# Patient Record
Sex: Male | Born: 1950 | Race: White | Hispanic: No | Marital: Married | Smoking: Former smoker
Health system: Southern US, Community
[De-identification: ages and names within clinical notes are randomized; demographics above are authoritative.]

## PROBLEM LIST (undated history)

## (undated) DIAGNOSIS — N183 Chronic kidney disease, stage 3 unspecified: Secondary | ICD-10-CM

## (undated) DIAGNOSIS — D631 Anemia in chronic kidney disease: Secondary | ICD-10-CM

## (undated) DIAGNOSIS — N189 Chronic kidney disease, unspecified: Secondary | ICD-10-CM

## (undated) DIAGNOSIS — Z8711 Personal history of peptic ulcer disease: Secondary | ICD-10-CM

## (undated) DIAGNOSIS — M47812 Spondylosis without myelopathy or radiculopathy, cervical region: Secondary | ICD-10-CM

## (undated) DIAGNOSIS — K219 Gastro-esophageal reflux disease without esophagitis: Secondary | ICD-10-CM

## (undated) DIAGNOSIS — M109 Gout, unspecified: Secondary | ICD-10-CM

## (undated) DIAGNOSIS — K603 Anal fistula, unspecified: Secondary | ICD-10-CM

## (undated) DIAGNOSIS — K5730 Diverticulosis of large intestine without perforation or abscess without bleeding: Secondary | ICD-10-CM

## (undated) DIAGNOSIS — Z973 Presence of spectacles and contact lenses: Secondary | ICD-10-CM

## (undated) DIAGNOSIS — D500 Iron deficiency anemia secondary to blood loss (chronic): Secondary | ICD-10-CM

## (undated) DIAGNOSIS — C649 Malignant neoplasm of unspecified kidney, except renal pelvis: Secondary | ICD-10-CM

## (undated) DIAGNOSIS — K2270 Barrett's esophagus without dysplasia: Secondary | ICD-10-CM

## (undated) DIAGNOSIS — D696 Thrombocytopenia, unspecified: Secondary | ICD-10-CM

## (undated) DIAGNOSIS — Z8719 Personal history of other diseases of the digestive system: Secondary | ICD-10-CM

## (undated) DIAGNOSIS — R351 Nocturia: Secondary | ICD-10-CM

## (undated) DIAGNOSIS — G8929 Other chronic pain: Secondary | ICD-10-CM

## (undated) HISTORY — PX: LAPAROSCOPIC NEPHRECTOMY: SUR781

## (undated) HISTORY — PX: INGUINAL HERNIA REPAIR: SUR1180

---
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
## 2003-07-19 ENCOUNTER — Emergency Department (HOSPITAL_COMMUNITY): Admission: EM | Admit: 2003-07-19 | Discharge: 2003-07-19 | Payer: Self-pay | Admitting: Emergency Medicine

## 2003-07-26 ENCOUNTER — Emergency Department (HOSPITAL_COMMUNITY): Admission: EM | Admit: 2003-07-26 | Discharge: 2003-07-26 | Payer: Self-pay | Admitting: Emergency Medicine

## 2003-07-28 ENCOUNTER — Ambulatory Visit (HOSPITAL_COMMUNITY): Admission: RE | Admit: 2003-07-28 | Discharge: 2003-07-28 | Payer: Self-pay | Admitting: Orthopaedic Surgery

## 2003-10-16 ENCOUNTER — Emergency Department (HOSPITAL_COMMUNITY): Admission: EM | Admit: 2003-10-16 | Discharge: 2003-10-16 | Payer: Self-pay | Admitting: Emergency Medicine

## 2004-05-25 ENCOUNTER — Emergency Department (HOSPITAL_COMMUNITY): Admission: EM | Admit: 2004-05-25 | Discharge: 2004-05-25 | Payer: Self-pay | Admitting: Emergency Medicine

## 2005-08-27 ENCOUNTER — Emergency Department (HOSPITAL_COMMUNITY): Admission: EM | Admit: 2005-08-27 | Discharge: 2005-08-27 | Payer: Self-pay | Admitting: Emergency Medicine

## 2005-08-29 ENCOUNTER — Emergency Department (HOSPITAL_COMMUNITY): Admission: EM | Admit: 2005-08-29 | Discharge: 2005-08-29 | Payer: Self-pay | Admitting: Emergency Medicine

## 2006-07-01 ENCOUNTER — Emergency Department (HOSPITAL_COMMUNITY): Admission: EM | Admit: 2006-07-01 | Discharge: 2006-07-01 | Payer: Self-pay | Admitting: Emergency Medicine

## 2007-04-15 ENCOUNTER — Emergency Department (HOSPITAL_COMMUNITY): Admission: EM | Admit: 2007-04-15 | Discharge: 2007-04-15 | Payer: Self-pay | Admitting: Emergency Medicine

## 2007-07-30 ENCOUNTER — Emergency Department (HOSPITAL_COMMUNITY): Admission: EM | Admit: 2007-07-30 | Discharge: 2007-07-31 | Payer: Self-pay | Admitting: Emergency Medicine

## 2007-11-11 ENCOUNTER — Ambulatory Visit: Payer: Self-pay | Admitting: Gastroenterology

## 2009-06-14 ENCOUNTER — Ambulatory Visit (HOSPITAL_COMMUNITY): Admission: RE | Admit: 2009-06-14 | Discharge: 2009-06-14 | Payer: Self-pay | Admitting: Urology

## 2009-06-27 ENCOUNTER — Inpatient Hospital Stay (HOSPITAL_COMMUNITY): Admission: RE | Admit: 2009-06-27 | Discharge: 2009-07-01 | Payer: Self-pay | Admitting: Urology

## 2009-06-27 ENCOUNTER — Encounter (INDEPENDENT_AMBULATORY_CARE_PROVIDER_SITE_OTHER): Payer: Self-pay | Admitting: Urology

## 2009-07-28 ENCOUNTER — Ambulatory Visit (HOSPITAL_COMMUNITY): Admission: RE | Admit: 2009-07-28 | Discharge: 2009-07-28 | Payer: Self-pay | Admitting: Urology

## 2009-08-09 ENCOUNTER — Ambulatory Visit (HOSPITAL_COMMUNITY): Admission: RE | Admit: 2009-08-09 | Discharge: 2009-08-09 | Payer: Self-pay | Admitting: Urology

## 2009-10-03 ENCOUNTER — Emergency Department (HOSPITAL_COMMUNITY): Admission: EM | Admit: 2009-10-03 | Discharge: 2009-10-03 | Payer: Self-pay | Admitting: Emergency Medicine

## 2009-10-18 HISTORY — PX: COLONOSCOPY: SHX174

## 2009-10-18 HISTORY — PX: ESOPHAGOGASTRODUODENOSCOPY: SHX1529

## 2009-10-20 ENCOUNTER — Encounter (INDEPENDENT_AMBULATORY_CARE_PROVIDER_SITE_OTHER): Payer: Self-pay | Admitting: *Deleted

## 2009-10-20 ENCOUNTER — Ambulatory Visit: Payer: Self-pay | Admitting: Internal Medicine

## 2009-10-20 DIAGNOSIS — R112 Nausea with vomiting, unspecified: Secondary | ICD-10-CM

## 2009-10-20 DIAGNOSIS — K625 Hemorrhage of anus and rectum: Secondary | ICD-10-CM

## 2009-10-20 DIAGNOSIS — R197 Diarrhea, unspecified: Secondary | ICD-10-CM

## 2009-11-02 ENCOUNTER — Ambulatory Visit (HOSPITAL_COMMUNITY): Admission: RE | Admit: 2009-11-02 | Discharge: 2009-11-02 | Payer: Self-pay | Admitting: Internal Medicine

## 2009-11-02 ENCOUNTER — Ambulatory Visit: Payer: Self-pay | Admitting: Internal Medicine

## 2009-11-05 ENCOUNTER — Encounter: Payer: Self-pay | Admitting: Internal Medicine

## 2010-06-10 ENCOUNTER — Emergency Department (HOSPITAL_COMMUNITY): Admission: EM | Admit: 2010-06-10 | Discharge: 2010-06-10 | Payer: Self-pay | Admitting: Emergency Medicine

## 2010-10-17 NOTE — Letter (Signed)
Summary: Patient Notice, Endo Biopsy Results  [facility]  [address]   [place], [place] [address]   Phone: [phone]  Fax: [phone]       November 05, 2009   Jeffrey Trujillo [address] [place], [place]  [address] Oct 10, 1950    Dear Jeffrey Trujillo,  I am pleased to inform you that the biopsies taken during your recent endoscopic examination did not show any evidence of cancer upon pathologic examination.  However, you do have a conditon called Barrett's esophagus which warrants  periodic examination of your esophagus.  Additional information/recommendations:  You should have a repeat endoscopic examination in 1 years.  Please call us if you are having persistent problems or have questions about your condition that have not been fully answered at this time.  Sincerely,    R. Roetta Sessions MD  [facility] Ph: [phone]   Fax: [phone]   Appended Document: Patient Notice, Endo Biopsy Results Letter and info on Barrett's mailed.

## 2010-10-17 NOTE — Letter (Signed)
Summary: Out of Work Note  [facility]  [address]   [place], [place] [address]   Phone: [phone]  Fax: [phone]    10/20/2009  TO: WHOM IT MAY CONCERN  RE: Jeffrey Trujillo [address] [id number] 24-May-1951       The above named individual is currently under my care and will be out of work    FROM: 10/20/2009   THROUGH: 10/21/2009    REASON: Unable to work.    MAY RETURN ON: 10/21/2009     If you have any further questions or need additional information, please call.     Sincerely,     [facility] R. Roetta Sessions, M.D.    Kassie Mends, M.D. Lorenza Burton, FNP-BC    Tana Coast, PA-C Phone: [phone]    Fax: [phone]

## 2010-10-17 NOTE — Letter (Signed)
Summary: TCS/EGD ORDER  TCS/EGD ORDER   Imported By: Ricard Dillon 10/20/2009 12:58:01  _____________________________________________________________________  External Attachment:    Type:   Image     Comment:   External Document

## 2010-10-17 NOTE — Assessment & Plan Note (Signed)
Summary: NPP/RECTAL BLEEDING./GU   Visit Type:  Initial Consult Referring Provider:  ED Referral Primary Care Provider:  [facility]  Chief Complaint:  rectal bleeding.  History of Present Illness: 60 y/o caucasian male w/ acute N/V/ & bloody diarrhea.  Took BC arthritis 4 days ago, felt sick, w/ c/o nausea, vomiting, bloody diarrhea TNTC. c/o umbilical & suprapubic pain, felt like someone "hit him in stomach", now resolved.  Last bleeding small volume and pinkish-red this AM.  Denies any fever.  Denies any ill contacts.  Denies HB or indigestion, denies dyspahagia or odynophagia.  Denies wt loss or anorexia. Denies early satiety.  Takes 2 naoprosyn couple times per month for gout.  Went to ER Mon PM. Had colonoscopy 2 yrs ago at [facility] in [place] was told normal.  Hx polyps Dr Madelin Headings in 1980's.  T Bili 1.7, indirect 1.4, otherwise normal LFTS.  Met 7 normal.   WBC                                      9.0               4.0-10.5         K/uL  RBC                                      4.08       l      4.22-5.81        MIL/uL  Hemoglobin (HGB)                         13.1              13.0-17.0        g/dL  Hematocrit (HCT)                         38.1       l      39.0-52.0        %  MCV                                      93.3              78.0-100.0       fL  MCHC                                     34.3              30.0-36.0        g/dL  RDW                                      18.3       h      11.5-15.5        %  Platelet Count (PLT)                     123  l      150-400          K/uL  Neutrophils, %                           89         h      43-77            %  Lymphocytes, %                           4          l      12-46            %  Monocytes, %                             7                 3-12             %  Eosinophils, %                           0                 0-5              %  Basophils, %                             0                  0-1              %  Neutrophils, Absolute                    8.0        h      1.7-7.7          K/uL  Lymphocytes, Absolute                    0.3        l      0.7-4.0          K/uL  Monocytes, Absolute                      0.6               0.1-1.0          K/uL  Eosinophils, Absolute                    0.0               0.0-0.7          K/uL  Basophils, Absolute                      0.0               0.0-0.1          K/uL  Current Problems (verified): 1)  Diarrhea, Bloody  (ICD-787.91) 2)  Nausea With Vomiting  (ICD-787.01) 3)  Rectal Bleeding  (ICD-569.3)  Current Medications (verified): 1)  Naprosyn 500 Mg Tabs (Naproxen) .... As Needed 2)  Tylenol .... As Needed 3)  Omeprazole 20 Mg Cpdr (Omeprazole) .... One By Mouth 30 Mins Before Breakfast Daily  Allergies (verified): 1)  ! Demerol  Past History:  Past Medical History: Renal cell ca dx Sept 2010 Dr. Laverle Patter [facility] gout  Past Surgical History: Right nephrectomy Oct 2010 LLL benign cyst age 75 SH/Risk Factors reviewed for relevance  Family History: No known family history of colorectal carcinoma, IBD, liver or chronic GI problems. 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 CV:  Denies chest pains, angina, palpitations, syncope, dyspnea on exertion, orthopnea, PND, peripheral edema, and claudication. Resp:  Denies dyspnea at rest, dyspnea with exercise, cough, sputum, wheezing, coughing up blood, and pleurisy. GI:  Denies vomiting blood,  jaundice, gas/bloating, constipation, black BMs, and fecal incontinence. GU:  Complains of urinary frequency and nocturnal urination; denies urinary burning, blood in urine, urinary hesitancy, and urinary incontinence. Derm:  Denies rash, itching, dry skin, hives, moles, warts, and unhealing ulcers. Psych:  Denies depression, anxiety, memory loss, suicidal ideation, hallucinations, paranoia, phobia, and confusion. Heme:  Denies bruising and enlarged lymph nodes.  Vital Signs:  Patient profile:   60 year old male Height:      71 inches Weight:      183 pounds BMI:     25.62 Temp:     97.7 degrees F oral Pulse rate:   72 / minute BP sitting:   134 / 80  (left arm) Cuff size:   regular  Vitals Entered By: Cloria Spring LPN (October 20, 2009 9:21 AM)  Physical Exam  General:  Well developed, well nourished, no acute distress. Head:  Normocephalic and atraumatic. Eyes:  Sclera clear, no icterus. Ears:  Normal auditory acuity. Nose:  No deformity, discharge,  or lesions. Mouth:  No deformity or lesions, dentition normal. Neck:  Supple; no masses or thyromegaly. Lungs:  Clear throughout to auscultation. Heart:  Regular rate and rhythm; no murmurs, rubs,  or bruits. Abdomen:  Soft, nontender and nondistended. No masses, hepatosplenomegaly or hernias noted. Normal bowel sounds.without guarding and without rebound.   Rectal:  deferred until time of colonoscopy.   Msk:  Symmetrical with no gross deformities. Normal posture. Pulses:  Normal pulses noted. Extremities:  No clubbing, cyanosis, edema or deformities noted. Neurologic:  Alert and  oriented x4;  grossly normal neurologically. Skin:  Intact without significant lesions or rashes. Cervical Nodes:  No significant cervical adenopathy. Psych:  Alert and cooperative. Normal mood and affect.  Impression & Recommendations:  Problem # 1:  DIARRHEA, BLOODY (ICD-40.91) 60 y/o caucasian male w/ acute onset bloody diarrhea, nausea &  vomiting x 4 days.  He had been taking BC Arthritis just prior to the onset.  He also consumes a significant amt of etoh daily.  He is going to need further evaluation to rule out PUD, gastritis, NSAID-induced colitis, inflammatory bowel disease, or acute gastroenteritis with benign anorectal bleeding.  Diagnostic colonoscopy & EGD to be performed by Dr. Suszanne Conners Rourk in the near future in the OR given daily etoh  use.  I have discussed risks and benefits which include, but are not limited to, bleeding, infection, perforation, or medication reaction.  The patient agrees with this plan and consent will be obtained.  Orders: Consultation Level III ([id number])  Problem # 2:  NAUSEA WITH VOMITING (ICD-787.01) See #1  Patient Instructions: 1)  Avoid Naprosyn & BC powders 2)  Call if any further rectal bleeding, pain, diarrhea, fever, etc or go to ER if after hrs Prescriptions: OMEPRAZOLE 20 MG CPDR (OMEPRAZOLE) one by mouth 30 mins before breakfast daily  #31 x 2   Entered and Authorized by:   Joselyn Arrow FNP-BC   Signed by:   Joselyn Arrow FNP-BC on 10/20/2009   Method used:   Electronically to        [facility] (retail)       [address]       [place], [place]  [address]       Ph: [phone]       Fax: [phone]   RxID:   [id number]

## 2010-11-03 ENCOUNTER — Encounter (INDEPENDENT_AMBULATORY_CARE_PROVIDER_SITE_OTHER): Payer: Self-pay

## 2010-11-08 NOTE — Letter (Signed)
Summary: Recall Colonoscopy/Endoscopy, Change to Office Visit  [facility]  [address]   [place], [place] [address]   Phone: [phone]  Fax: [phone]      November 03, 2010   Jeffrey Trujillo [address] [place], [place]  [address] 10-25-1950   Dear [name],   According to our records, it is time for you to schedule a Colonoscopy/Endoscopy. However, after reviewing your medical record, we recommend an office visit in order to determine your need for a repeat procedure.  Please call [phone] at your convenience to schedule an office visit. If you have any questions or concerns, please feel free to contact our office.   Sincerely,   Cloria Spring LPN  [facility] Ph: [phone]   Fax: [phone]

## 2010-12-03 LAB — CBC
HCT: 38.1 % — ABNORMAL LOW (ref 39.0–52.0)
Hemoglobin: 13.1 g/dL (ref 13.0–17.0)
MCHC: 34.3 g/dL (ref 30.0–36.0)
MCV: 93.3 fL (ref 78.0–100.0)
Platelets: 123 10*3/uL — ABNORMAL LOW (ref 150–400)
RBC: 4.08 MIL/uL — ABNORMAL LOW (ref 4.22–5.81)
RDW: 18.3 % — ABNORMAL HIGH (ref 11.5–15.5)
WBC: 9 10*3/uL (ref 4.0–10.5)

## 2010-12-03 LAB — DIFFERENTIAL
Basophils Absolute: 0 10*3/uL (ref 0.0–0.1)
Basophils Relative: 0 % (ref 0–1)
Eosinophils Absolute: 0 10*3/uL (ref 0.0–0.7)
Eosinophils Relative: 0 % (ref 0–5)
Lymphocytes Relative: 4 % — ABNORMAL LOW (ref 12–46)
Lymphs Abs: 0.3 10*3/uL — ABNORMAL LOW (ref 0.7–4.0)
Monocytes Absolute: 0.6 10*3/uL (ref 0.1–1.0)
Monocytes Relative: 7 % (ref 3–12)
Neutro Abs: 8 10*3/uL — ABNORMAL HIGH (ref 1.7–7.7)
Neutrophils Relative %: 89 % — ABNORMAL HIGH (ref 43–77)

## 2010-12-03 LAB — HEPATIC FUNCTION PANEL
ALT: 35 U/L (ref 0–53)
AST: 31 U/L (ref 0–37)
Albumin: 3.5 g/dL (ref 3.5–5.2)
Alkaline Phosphatase: 94 U/L (ref 39–117)
Bilirubin, Direct: 0.3 mg/dL (ref 0.0–0.3)
Indirect Bilirubin: 1.4 mg/dL — ABNORMAL HIGH (ref 0.3–0.9)
Total Bilirubin: 1.7 mg/dL — ABNORMAL HIGH (ref 0.3–1.2)
Total Protein: 7 g/dL (ref 6.0–8.3)

## 2010-12-03 LAB — BASIC METABOLIC PANEL
BUN: 14 mg/dL (ref 6–23)
CO2: 28 mEq/L (ref 19–32)
Calcium: 8.9 mg/dL (ref 8.4–10.5)
Chloride: 98 mEq/L (ref 96–112)
Creatinine, Ser: 1.22 mg/dL (ref 0.4–1.5)
GFR calc Af Amer: >60 mL/min (ref >60)
GFR calc non Af Amer: >60 mL/min (ref >60)
Glucose, Bld: 109 mg/dL — ABNORMAL HIGH (ref 70–99)
Potassium: 3.8 mEq/L (ref 3.5–5.1)
Sodium: 136 mEq/L (ref 135–145)

## 2010-12-03 LAB — SAMPLE TO BLOOD BANK

## 2010-12-20 LAB — CBC
HCT: 31 % — ABNORMAL LOW (ref 39.0–52.0)
Hemoglobin: 10.9 g/dL — ABNORMAL LOW (ref 13.0–17.0)
MCHC: 35.1 g/dL (ref 30.0–36.0)
MCV: 89.3 fL (ref 78.0–100.0)
Platelets: 241 10*3/uL (ref 150–400)
RBC: 3.47 MIL/uL — ABNORMAL LOW (ref 4.22–5.81)
RDW: 14.9 % (ref 11.5–15.5)
WBC: 5.4 10*3/uL (ref 4.0–10.5)

## 2010-12-20 LAB — PROTIME-INR
INR: 0.96 (ref 0.00–1.49)
Prothrombin Time: 12.7 seconds (ref 11.6–15.2)

## 2010-12-20 LAB — APTT: aPTT: 32 seconds (ref 24–37)

## 2010-12-21 LAB — DIFFERENTIAL
Basophils Absolute: 0 10*3/uL (ref 0.0–0.1)
Basophils Relative: 1 % (ref 0–1)
Eosinophils Absolute: 0.1 10*3/uL (ref 0.0–0.7)
Eosinophils Absolute: 0.1 10*3/uL (ref 0.0–0.7)
Eosinophils Relative: 2 % (ref 0–5)
Lymphs Abs: 0.2 10*3/uL — ABNORMAL LOW (ref 0.7–4.0)
Monocytes Absolute: 0.4 10*3/uL (ref 0.1–1.0)
Monocytes Relative: 11 % (ref 3–12)
Neutrophils Relative %: 71 % (ref 43–77)

## 2010-12-21 LAB — BASIC METABOLIC PANEL
BUN: 7 mg/dL (ref 6–23)
BUN: 7 mg/dL (ref 6–23)
CO2: 27 mEq/L (ref 19–32)
CO2: 28 mEq/L (ref 19–32)
CO2: 32 mEq/L (ref 19–32)
Calcium: 8 mg/dL — ABNORMAL LOW (ref 8.4–10.5)
Calcium: 8.2 mg/dL — ABNORMAL LOW (ref 8.4–10.5)
Calcium: 8.6 mg/dL (ref 8.4–10.5)
Calcium: 9.4 mg/dL (ref 8.4–10.5)
Chloride: 101 mEq/L (ref 96–112)
Chloride: 104 mEq/L (ref 96–112)
Creatinine, Ser: 0.89 mg/dL (ref 0.4–1.5)
Creatinine, Ser: 1.06 mg/dL (ref 0.4–1.5)
Creatinine, Ser: 1.53 mg/dL — ABNORMAL HIGH (ref 0.4–1.5)
Creatinine, Ser: 1.74 mg/dL — ABNORMAL HIGH (ref 0.4–1.5)
GFR calc Af Amer: 49 mL/min — ABNORMAL LOW (ref >60)
GFR calc Af Amer: >60 mL/min (ref >60)
GFR calc Af Amer: >60 mL/min (ref >60)
GFR calc non Af Amer: 47 mL/min — ABNORMAL LOW (ref >60)
GFR calc non Af Amer: >60 mL/min (ref >60)
Glucose, Bld: 104 mg/dL — ABNORMAL HIGH (ref 70–99)
Glucose, Bld: 99 mg/dL (ref 70–99)
Potassium: 4.5 mEq/L (ref 3.5–5.1)
Sodium: 136 mEq/L (ref 135–145)
Sodium: 139 mEq/L (ref 135–145)

## 2010-12-21 LAB — TYPE AND SCREEN
ABO/RH(D): B NEG
Antibody Screen: NEGATIVE

## 2010-12-21 LAB — CBC
HCT: 24.7 % — ABNORMAL LOW (ref 39.0–52.0)
HCT: 37.3 % — ABNORMAL LOW (ref 39.0–52.0)
Hemoglobin: 13 g/dL (ref 13.0–17.0)
MCHC: 34.4 g/dL (ref 30.0–36.0)
MCHC: 34.9 g/dL (ref 30.0–36.0)
MCHC: 35 g/dL (ref 30.0–36.0)
MCV: 93.5 fL (ref 78.0–100.0)
MCV: 96.4 fL (ref 78.0–100.0)
MCV: 99.3 fL (ref 78.0–100.0)
Platelets: 124 10*3/uL — ABNORMAL LOW (ref 150–400)
Platelets: 183 10*3/uL (ref 150–400)
Platelets: 186 10*3/uL (ref 150–400)
RBC: 3.75 MIL/uL — ABNORMAL LOW (ref 4.22–5.81)
RDW: 14.3 % (ref 11.5–15.5)
RDW: 14.4 % (ref 11.5–15.5)
RDW: 14.9 % (ref 11.5–15.5)
WBC: 2.8 10*3/uL — ABNORMAL LOW (ref 4.0–10.5)
WBC: 3.5 10*3/uL — ABNORMAL LOW (ref 4.0–10.5)
WBC: 3.7 10*3/uL — ABNORMAL LOW (ref 4.0–10.5)

## 2010-12-21 LAB — ABO/RH: ABO/RH(D): B NEG

## 2010-12-21 LAB — HEMOGLOBIN AND HEMATOCRIT, BLOOD
HCT: 27.8 % — ABNORMAL LOW (ref 39.0–52.0)
HCT: 35.1 % — ABNORMAL LOW (ref 39.0–52.0)
Hemoglobin: 12.1 g/dL — ABNORMAL LOW (ref 13.0–17.0)
Hemoglobin: 13.8 g/dL (ref 13.0–17.0)
Hemoglobin: 9.9 g/dL — ABNORMAL LOW (ref 13.0–17.0)

## 2010-12-21 LAB — PREPARE RBC (CROSSMATCH)

## 2010-12-26 ENCOUNTER — Other Ambulatory Visit (HOSPITAL_COMMUNITY): Payer: Self-pay | Admitting: Urology

## 2010-12-26 ENCOUNTER — Ambulatory Visit (HOSPITAL_COMMUNITY)
Admission: RE | Admit: 2010-12-26 | Discharge: 2010-12-26 | Disposition: A | Discharge status: Home / Self Care | Payer: PRIVATE HEALTH INSURANCE | Source: Ambulatory Visit | Attending: Urology | Admitting: Urology

## 2010-12-26 DIAGNOSIS — Z902 Acquired absence of lung [part of]: Secondary | ICD-10-CM | POA: Insufficient documentation

## 2010-12-26 DIAGNOSIS — C649 Malignant neoplasm of unspecified kidney, except renal pelvis: Secondary | ICD-10-CM | POA: Insufficient documentation

## 2011-06-05 ENCOUNTER — Emergency Department (HOSPITAL_COMMUNITY): Payer: PRIVATE HEALTH INSURANCE

## 2011-06-05 ENCOUNTER — Emergency Department (HOSPITAL_COMMUNITY)
Admission: EM | Admit: 2011-06-05 | Discharge: 2011-06-05 | Disposition: A | Discharge status: Home / Self Care | Payer: PRIVATE HEALTH INSURANCE | Attending: Emergency Medicine | Admitting: Emergency Medicine

## 2011-06-05 DIAGNOSIS — S161XXA Strain of muscle, fascia and tendon at neck level, initial encounter: Secondary | ICD-10-CM | POA: Diagnosis present

## 2011-06-05 DIAGNOSIS — R52 Pain, unspecified: Secondary | ICD-10-CM

## 2011-06-05 DIAGNOSIS — M542 Cervicalgia: Secondary | ICD-10-CM | POA: Insufficient documentation

## 2011-06-05 MED ORDER — IBUPROFEN 800 MG PO TABS: 800.0000 mg | ORAL_TABLET | Freq: Three times a day (TID) | ORAL | Status: AC

## 2011-06-05 MED ORDER — CYCLOBENZAPRINE HCL 5 MG PO TABS: 5.0000 mg | ORAL_TABLET | Freq: Three times a day (TID) | ORAL | Status: AC | PRN

## 2011-06-05 MED ORDER — OXYCODONE-ACETAMINOPHEN 5-325 MG PO TABS: 1.0000 | ORAL_TABLET | ORAL | Status: AC | PRN

## 2011-06-05 MED ORDER — FENTANYL CITRATE 0.05 MG/ML IJ SOLN
100.0000 ug | Freq: Once | INTRAMUSCULAR | Status: AC
  Administered 2011-06-05: 100 ug via INTRAVENOUS

## 2011-06-05 NOTE — ED Notes (Signed)
See paper chart for further documentation.

## 2011-06-05 NOTE — Progress Notes (Signed)
History of Present Illness   Patient Identification Jeffrey Trujillo is a 60 y.o. male.  Patient information was obtained from patient and spouse/SO. History/Exam limitations: none. Patient presented to the Emergency Department by private vehicle.  Chief Complaint  No chief complaint on file.   Patient presents with complaint of back pain. This is a result of no known injury. Onset of pain was a few hours ago and has been gradually worsening since. The pain is located in superior cervical spine and base of skull, described as stiffness and rated as severe, without radiation. Symptoms include no other symptoms. The patient also complains of poor posture, recent heavy lifting which resulted in MSK strain in his chest last week. The patient denies weakness, numbness, incontinence, abdominal pain, fever, tingling, chest pain. The patient denies other injuries. Care prior to arrival consisted of ice, heat and indomethacin with no relief.  No past medical history on file. No family history on file. Current Facility-Administered Medications  Medication Dose Route Frequency Provider Last Rate Last Dose  . fentaNYL (SUBLIMAZE) 0.05 MG/ML injection 100 mcg  100 mcg Intravenous Once Jacquelyn McGill   100 mcg at 06/05/11 1205   Current Outpatient Prescriptions  Medication Sig Dispense Refill  . cyclobenzaprine (FLEXERIL) 5 MG tablet Take 1-2 tablets (5-10 mg total) by mouth 3 (three) times daily as needed for muscle spasms.  30 tablet  0  . ibuprofen (ADVIL,MOTRIN) 800 MG tablet Take 1 tablet (800 mg total) by mouth 3 (three) times daily. For 5 days, then PRN pain  30 tablet  0  . oxyCODONE-acetaminophen (PERCOCET) 5-325 MG per tablet Take 1-2 tablets by mouth every 4 (four) hours as needed for pain.  30 tablet  0   Allergies  Allergen Reactions  . Nsaids Other (See Comments)    Patient only has one kidney.   . Meperidine Hcl Hives and Rash   History   Social History  . Marital Status:  Married    Spouse Name: N/A    Number of Children: N/A  . Years of Education: N/A   Occupational History  . Not on file.   Social History Main Topics  . Smoking status: Not on file  . Smokeless tobacco: Not on file  . Alcohol Use: Not on file  . Drug Use: Not on file  . Sexually Active: Not on file   Other Topics Concern  . Not on file   Social History Narrative  . No narrative on file   Review of Systems Pertinent items are noted in HPI.   Physical Exam   BP 146/71  Pulse 52  Temp(Src) 98.8 F (37.1 C) (Oral)  Resp 20  SpO2 98% BP 146/71  Pulse 52  Temp(Src) 98.8 F (37.1 C) (Oral)  Resp 20  SpO2 98% General appearance: alert, cooperative and moderate distress Head: Normocephalic, without obvious abnormality, atraumatic Eyes: conjunctivae/corneas clear. PERRL, EOM's intact. Fundi benign. Ears: normal TM's and external ear canals both ears Nose: Nares normal. Septum midline. Mucosa normal. No drainage or sinus tenderness. Throat: lips, mucosa, and tongue normal; teeth and gums normal Neck: decreased ROM, TTP over bilateral trapezius muscles Back: negative Lungs: clear to auscultation bilaterally Heart: regular rate and rhythm, S1, S2 normal, no murmur, click, rub or gallop Abdomen: soft, non-tender; bowel sounds normal; no masses,  no organomegaly Extremities: extremities normal, atraumatic, no cyanosis or edema and no edema, redness or tenderness in the calves or thighs Neurologic: Alert and oriented X 3, normal strength  and tone. Normal symmetric reflexes. Normal coordination and gait Cranial nerves: normal  ED Course   Studies: Imaging: X-ray: C-spine: Normal Results reviewed with Radiologist. MRI showing bulging disc but no other acute abnormalities (see report for full details)  Records Reviewed: Old medical records. Nursing notes.  Treatments: morphine intravenous, fentanyl intravenous and Flexeril oral for pain with moderate  relief.   Consultations: none  Disposition: Home Nonsteroidals, Narcotic pain medication, Muscle relaxants, Soft collar and Advised to return for signs of head injury, weakness, numbness or tingling to extremities, incontinence Diagnostic tests were reviewed and questions answered. Diagnosis, care plan and treatment options were discussed. The patient and spouse/SO understand instructions and will follow up as directed. Condition stable The patient and spouse was given verbal neck pain instructions

## 2011-06-05 NOTE — ED Provider Notes (Deleted)
History     CSN: [id number] Arrival date & time: 06/05/2011 10:01 AM   No chief complaint on file.    (Include location/radiation/quality/duration/timing/severity/associated sxs/prior treatment) HPI   No past medical history on file.   No past surgical history on file.  No family history on file.  History  Substance Use Topics  . Smoking status: Not on file  . Smokeless tobacco: Not on file  . Alcohol Use: Not on file      Review of Systems  Allergies  Nsaids and Meperidine hcl  Home Medications   Current Outpatient Rx  Name Route Sig Dispense Refill  . CYCLOBENZAPRINE HCL 5 MG PO TABS Oral Take 1-2 tablets (5-10 mg total) by mouth 3 (three) times daily as needed for muscle spasms. 30 tablet 0  . IBUPROFEN 800 MG PO TABS Oral Take 1 tablet (800 mg total) by mouth 3 (three) times daily. For 5 days, then PRN pain 30 tablet 0  . OXYCODONE-ACETAMINOPHEN 5-325 MG PO TABS Oral Take 1-2 tablets by mouth every 4 (four) hours as needed for pain. 30 tablet 0    Physical Exam    BP 146/71  Pulse 52  Temp(Src) 98.8 F (37.1 C) (Oral)  Resp 20  SpO2 98%  Physical Exam  ED Course  Procedures  Results for orders placed during the hospital encounter of 10/03/09  BASIC METABOLIC PANEL      Component Value Range   Sodium 136  135 - 145 (mEq/L)   Potassium 3.8  3.5 - 5.1 (mEq/L)   Chloride 98  96 - 112 (mEq/L)   CO2 28  19 - 32 (mEq/L)   Glucose, Bld 109 (*) 70 - 99 (mg/dL)   BUN 14  6 - 23 (mg/dL)   Creatinine, Ser 4.09  0.4 - 1.5 (mg/dL)   Calcium 8.9  8.4 - 81.1 (mg/dL)   GFR calc non Af Amer >60  >60 (mL/min)   GFR calc Af Amer    >60 (mL/min)   Value: >60            The eGFR has been calculated     using the MDRD equation.     This calculation has not been     validated in all clinical     situations.     eGFR's persistently     <60 mL/min signify     possible Chronic Kidney Disease.  CBC      Component Value Range   WBC 9.0  4.0 - 10.5 (K/uL)   RBC  4.08 (*) 4.22 - 5.81 (MIL/uL)   Hemoglobin 13.1  13.0 - 17.0 (g/dL)   HCT 91.4 (*) 78.2 - 52.0 (%)   MCV 93.3  78.0 - 100.0 (fL)   MCHC 34.3  30.0 - 36.0 (g/dL)   RDW 95.6 (*) 21.3 - 15.5 (%)   Platelets 123 (*) 150 - 400 (K/uL)  DIFFERENTIAL      Component Value Range   Neutrophils Relative 89 (*) 43 - 77 (%)   Neutro Abs 8.0 (*) 1.7 - 7.7 (K/uL)   Lymphocytes Relative 4 (*) 12 - 46 (%)   Lymphs Abs 0.3 (*) 0.7 - 4.0 (K/uL)   Monocytes Relative 7  3 - 12 (%)   Monocytes Absolute 0.6  0.1 - 1.0 (K/uL)   Eosinophils Relative 0  0 - 5 (%)   Eosinophils Absolute 0.0  0.0 - 0.7 (K/uL)   Basophils Relative 0  0 - 1 (%)   Basophils Absolute  0.0  0.0 - 0.1 (K/uL)  HEPATIC FUNCTION PANEL      Component Value Range   Total Protein 7.0  6.0 - 8.3 (g/dL)   Albumin 3.5  3.5 - 5.2 (g/dL)   AST 31  0 - 37 (U/L)   ALT 35  0 - 53 (U/L)   Alkaline Phosphatase 94  39 - 117 (U/L)   Total Bilirubin 1.7 (*) 0.3 - 1.2 (mg/dL)   Bilirubin, Direct 0.3  0.0 - 0.3 (mg/dL)   Indirect Bilirubin 1.4 (*) 0.3 - 0.9 (mg/dL)  SAMPLE TO BLOOD BANK      Component Value Range   Blood Bank Specimen SAMPLE AVAILABLE FOR TESTING     Sample Expiration 10/06/2009     Dg Cervical Spine Complete  06/05/2011  *RADIOLOGY REPORT*  Clinical Data: Neck pain  CERVICAL SPINE - COMPLETE 4+ VIEW  Comparison: None.  Findings: The cervical spine is visualized to the bottom of C7 on the lateral view.  Reversal of the normal cervical lordosis.  No evidence of fracture or dislocation. The vertebral body heights are maintained.  The dens appears intact.  The lateral masses of C1 are symmetric.  No prevertebral soft tissue swelling.  Multilevel degenerative changes.  Visualized lung apices are clear.  IMPRESSION: No fracture or dislocation is seen.  Multilevel degenerative changes.  Original Report Authenticated By: Charline Bills, M.D.   Mr Cervical Spine Wo Contrast  06/05/2011  *RADIOLOGY REPORT*  Clinical Data: Neck pain radiating  into both arms.  History of renal cell carcinoma.  MRI CERVICAL SPINE WITHOUT CONTRAST  Technique:  Multiplanar and multiecho pulse sequences of the cervical spine, to include the craniocervical junction and cervicothoracic junction, were obtained according to standard protocol without intravenous contrast.  Comparison: None.  Findings: Straightening of the normal cervical lordosis is noted. Vertebral body height and alignment are maintained.  No worrisome marrow lesion.  The craniocervical junction is normal and cervical cord signal is normal.  Imaged paraspinous structures are unremarkable.  C2-3:  Mild disc bulge without central canal or foraminal narrowing.  C3-4:  Shallow central protrusion effaces the ventral thecal sac. No cord deformity.  Uncovertebral disease causes mild to moderate foraminal narrowing, greater on the right.  C4-5:  There is some posterior bony ridging and uncovertebral disease.  Ventral thecal sac is narrowed but not effaced.  Foramina are open.  C5-6:  Small disc osteophyte complex with uncovertebral disease. Central canal and left foramen are mildly narrowed.  Right foramen open.  C6-7:  Disc osteophyte complex eccentric to the left with uncovertebral disease.  Ventral thecal sac is effaced.  Moderate left foraminal narrowing is identified.  Right foramen open.  C7-T1:  Negative.  IMPRESSION:  1.  Shallow central protrusion at C3-4 effaces the ventral thecal sac.  No cord deformity. 2.  Mild central canal and left foraminal narrowing C5-6. 3.  Moderate left foraminal narrowing C6-7.  Disc at this level effaces the ventral thecal sac.  Original Report Authenticated By: Bernadene Bell. Maricela Curet, M.D.     1. Pain      MDM        Nelia Shi, MD 06/05/11 2259

## 2011-06-05 NOTE — Progress Notes (Signed)
History of Present Illness   Patient Identification Jeffrey Trujillo is a 60 y.o. male.  Patient information was obtained from patient and spouse/SO. History/Exam limitations: none. Patient presented to the Emergency Department by private vehicle.  Chief Complaint  No chief complaint on file.   Patient presents with complaint of back pain. This is a result of no known injury. Onset of pain was a few hours ago and has been gradually worsening since. The pain is located in superior cervical spine and base of skull, described as stiffness and rated as severe, without radiation. Symptoms include no other symptoms. The patient also complains of poor posture, recent heavy lifting which resulted in MSK strain in his chest last week. The patient denies weakness, numbness, incontinence, abdominal pain, fever, tingling, chest pain. The patient denies other injuries. Care prior to arrival consisted of ice, heat and indomethacin with no relief.  No past medical history on file. No family history on file. Current Facility-Administered Medications  Medication Dose Route Frequency Provider Last Rate Last Dose  . fentaNYL (SUBLIMAZE) 0.05 MG/ML injection 100 mcg  100 mcg Intravenous Once Jorden Mahl   100 mcg at 06/05/11 1205   Current Outpatient Prescriptions  Medication Sig Dispense Refill  . indomethacin (INDOCIN) 50 MG capsule Take 50 mg by mouth daily as needed. Gout Flare Ups        Allergies  Allergen Reactions  . Nsaids Other (See Comments)    Patient only has one kidney.   . Meperidine Hcl Hives and Rash   History   Social History  . Marital Status: Married    Spouse Name: N/A    Number of Children: N/A  . Years of Education: N/A   Occupational History  . Not on file.   Social History Main Topics  . Smoking status: Not on file  . Smokeless tobacco: Not on file  . Alcohol Use: Not on file  . Drug Use: Not on file  . Sexually Active: Not on file   Other Topics Concern    . Not on file   Social History Narrative  . No narrative on file   Review of Systems Pertinent items are noted in HPI.   Physical Exam   BP 146/71  Pulse 52  Temp(Src) 98.8 F (37.1 C) (Oral)  Resp 20  SpO2 98% BP 146/71  Pulse 52  Temp(Src) 98.8 F (37.1 C) (Oral)  Resp 20  SpO2 98% General appearance: alert, cooperative and moderate distress Head: Normocephalic, without obvious abnormality, atraumatic Eyes: conjunctivae/corneas clear. PERRL, EOM's intact. Fundi benign. Ears: normal TM's and external ear canals both ears Nose: Nares normal. Septum midline. Mucosa normal. No drainage or sinus tenderness. Throat: lips, mucosa, and tongue normal; teeth and gums normal Neck: decreased ROM, TTP over bilateral trapezius muscles Back: negative Lungs: clear to auscultation bilaterally Heart: regular rate and rhythm, S1, S2 normal, no murmur, click, rub or gallop Abdomen: soft, non-tender; bowel sounds normal; no masses,  no organomegaly Extremities: extremities normal, atraumatic, no cyanosis or edema and no edema, redness or tenderness in the calves or thighs Neurologic: Alert and oriented X 3, normal strength and tone. Normal symmetric reflexes. Normal coordination and gait Cranial nerves: normal  ED Course   Studies: Imaging: X-ray: C-spine: Normal Results reviewed with Radiologist. MRI showing bulging disc but no other acute abnormalities (see report for full details)  Records Reviewed: Old medical records. Nursing notes.  Treatments: morphine intravenous, fentanyl intravenous and Flexeril oral for pain with moderate relief.  Consultations: none  Disposition: Home Nonsteroidals, Narcotic pain medication, Muscle relaxants, Soft collar and Advised to return for signs of head injury, weakness, numbness or tingling to extremities, incontinence Diagnostic tests were reviewed and questions answered. Diagnosis, care plan and treatment options were discussed. The patient  and spouse/SO understand instructions and will follow up as directed. Condition stable The patient and spouse was given verbal neck pain instructions

## 2011-06-05 NOTE — ED Provider Notes (Addendum)
History     CSN: [id number] Arrival date & time: 06/05/2011 10:01 AM   No chief complaint on file.    (Include location/radiation/quality/duration/timing/severity/associated sxs/prior treatment) HPI  Patient presents with complaint of back pain. This is a result of no known injury. Onset of pain was a few hours ago and has been gradually worsening since. The pain is located in superior cervical spine and base of skull, described as stiffness and rated as severe, without radiation. Symptoms include no other symptoms. The patient also complains of poor posture, recent heavy lifting which resulted in MSK strain in his chest last week. The patient denies weakness, numbness, incontinence, abdominal pain, fever, tingling, chest pain. The patient denies other injuries. Care prior to arrival consisted of ice, heat and indomethacin with no relief. No past medical history on file.   No past surgical history on file.  No family history on file.  History  Substance Use Topics  . Smoking status: Not on file  . Smokeless tobacco: Not on file  . Alcohol Use: Not on file      Review of Systems  All other systems reviewed and are negative.    Allergies  Nsaids and Meperidine hcl  Home Medications   Current Outpatient Rx  Name Route Sig Dispense Refill  . CYCLOBENZAPRINE HCL 5 MG PO TABS Oral Take 1-2 tablets (5-10 mg total) by mouth 3 (three) times daily as needed for muscle spasms. 30 tablet 0  . IBUPROFEN 800 MG PO TABS Oral Take 1 tablet (800 mg total) by mouth 3 (three) times daily. For 5 days, then PRN pain 30 tablet 0  . OXYCODONE-ACETAMINOPHEN 5-325 MG PO TABS Oral Take 1-2 tablets by mouth every 4 (four) hours as needed for pain. 30 tablet 0    Physical Exam    BP 146/71  Pulse 52  Temp(Src) 98.8 F (37.1 C) (Oral)  Resp 20  SpO2 98%  Physical Exam BP 146/71  Pulse 52  Temp(Src) 98.8 F (37.1 C) (Oral)  Resp 20  SpO2 98%  BP 146/71  Pulse 52  Temp(Src) 98.8 F  (37.1 C) (Oral)  Resp 20  SpO2 98%  General appearance: alert, cooperative and moderate distress  Head: Normocephalic, without obvious abnormality, atraumatic  Eyes: conjunctivae/corneas clear. PERRL, EOM's intact. Fundi benign.  Ears: normal TM's and external ear canals both ears  Nose: Nares normal. Septum midline. Mucosa normal. No drainage or sinus tenderness.  Throat: lips, mucosa, and tongue normal; teeth and gums normal  Neck: decreased ROM, TTP over bilateral trapezius muscles  Back: negative  Lungs: clear to auscultation bilaterally  Heart: regular rate and rhythm, S1, S2 normal, no murmur, click, rub or gallop  Abdomen: soft, non-tender; bowel sounds normal; no masses, no organomegaly  Extremities: extremities normal, atraumatic, no cyanosis or edema and no edema, redness or tenderness in the calves or thighs  Neurologic: Alert and oriented X 3, normal strength and tone. Normal symmetric reflexes. Normal coordination and gait  Cranial nerves: normal  ED Course  Procedures  Results for orders placed during the hospital encounter of 10/03/09  BASIC METABOLIC PANEL      Component Value Range   Sodium 136  135 - 145 (mEq/L)   Potassium 3.8  3.5 - 5.1 (mEq/L)   Chloride 98  96 - 112 (mEq/L)   CO2 28  19 - 32 (mEq/L)   Glucose, Bld 109 (*) 70 - 99 (mg/dL)   BUN 14  6 - 23 (mg/dL)   Creatinine,  Ser 1.22  0.4 - 1.5 (mg/dL)   Calcium 8.9  8.4 - 16.1 (mg/dL)   GFR calc non Af Amer >60  >60 (mL/min)   GFR calc Af Amer    >60 (mL/min)   Value: >60            The eGFR has been calculated     using the MDRD equation.     This calculation has not been     validated in all clinical     situations.     eGFR's persistently     <60 mL/min signify     possible Chronic Kidney Disease.  CBC      Component Value Range   WBC 9.0  4.0 - 10.5 (K/uL)   RBC 4.08 (*) 4.22 - 5.81 (MIL/uL)   Hemoglobin 13.1  13.0 - 17.0 (g/dL)   HCT 09.6 (*) 04.5 - 52.0 (%)   MCV 93.3  78.0 - 100.0 (fL)     MCHC 34.3  30.0 - 36.0 (g/dL)   RDW 40.9 (*) 81.1 - 15.5 (%)   Platelets 123 (*) 150 - 400 (K/uL)  DIFFERENTIAL      Component Value Range   Neutrophils Relative 89 (*) 43 - 77 (%)   Neutro Abs 8.0 (*) 1.7 - 7.7 (K/uL)   Lymphocytes Relative 4 (*) 12 - 46 (%)   Lymphs Abs 0.3 (*) 0.7 - 4.0 (K/uL)   Monocytes Relative 7  3 - 12 (%)   Monocytes Absolute 0.6  0.1 - 1.0 (K/uL)   Eosinophils Relative 0  0 - 5 (%)   Eosinophils Absolute 0.0  0.0 - 0.7 (K/uL)   Basophils Relative 0  0 - 1 (%)   Basophils Absolute 0.0  0.0 - 0.1 (K/uL)  HEPATIC FUNCTION PANEL      Component Value Range   Total Protein 7.0  6.0 - 8.3 (g/dL)   Albumin 3.5  3.5 - 5.2 (g/dL)   AST 31  0 - 37 (U/L)   ALT 35  0 - 53 (U/L)   Alkaline Phosphatase 94  39 - 117 (U/L)   Total Bilirubin 1.7 (*) 0.3 - 1.2 (mg/dL)   Bilirubin, Direct 0.3  0.0 - 0.3 (mg/dL)   Indirect Bilirubin 1.4 (*) 0.3 - 0.9 (mg/dL)  SAMPLE TO BLOOD BANK      Component Value Range   Blood Bank Specimen SAMPLE AVAILABLE FOR TESTING     Sample Expiration 10/06/2009     Dg Cervical Spine Complete  06/05/2011  *RADIOLOGY REPORT*  Clinical Data: Neck pain  CERVICAL SPINE - COMPLETE 4+ VIEW  Comparison: None.  Findings: The cervical spine is visualized to the bottom of C7 on the lateral view.  Reversal of the normal cervical lordosis.  No evidence of fracture or dislocation. The vertebral body heights are maintained.  The dens appears intact.  The lateral masses of C1 are symmetric.  No prevertebral soft tissue swelling.  Multilevel degenerative changes.  Visualized lung apices are clear.  IMPRESSION: No fracture or dislocation is seen.  Multilevel degenerative changes.  Original Report Authenticated By: Charline Bills, M.D.   Mr Cervical Spine Wo Contrast  06/05/2011  *RADIOLOGY REPORT*  Clinical Data: Neck pain radiating into both arms.  History of renal cell carcinoma.  MRI CERVICAL SPINE WITHOUT CONTRAST  Technique:  Multiplanar and multiecho pulse  sequences of the cervical spine, to include the craniocervical junction and cervicothoracic junction, were obtained according to standard protocol without intravenous contrast.  Comparison: None.  Findings: Straightening of the normal cervical lordosis is noted. Vertebral body height and alignment are maintained.  No worrisome marrow lesion.  The craniocervical junction is normal and cervical cord signal is normal.  Imaged paraspinous structures are unremarkable.  C2-3:  Mild disc bulge without central canal or foraminal narrowing.  C3-4:  Shallow central protrusion effaces the ventral thecal sac. No cord deformity.  Uncovertebral disease causes mild to moderate foraminal narrowing, greater on the right.  C4-5:  There is some posterior bony ridging and uncovertebral disease.  Ventral thecal sac is narrowed but not effaced.  Foramina are open.  C5-6:  Small disc osteophyte complex with uncovertebral disease. Central canal and left foramen are mildly narrowed.  Right foramen open.  C6-7:  Disc osteophyte complex eccentric to the left with uncovertebral disease.  Ventral thecal sac is effaced.  Moderate left foraminal narrowing is identified.  Right foramen open.  C7-T1:  Negative.  IMPRESSION:  1.  Shallow central protrusion at C3-4 effaces the ventral thecal sac.  No cord deformity. 2.  Mild central canal and left foraminal narrowing C5-6. 3.  Moderate left foraminal narrowing C6-7.  Disc at this level effaces the ventral thecal sac.  Original Report Authenticated By: Bernadene Bell. D'ALESSIO, M.D.     1. Pain    Plan: Soft Cervical collar.  Pain medication and follow up encouraged with PMD  MDM        Nelia Shi, MD 06/05/11 0454  Nelia Shi, MD 07/12/11 [phone]

## 2011-06-12 ENCOUNTER — Encounter: Payer: Self-pay | Admitting: *Deleted

## 2011-06-12 ENCOUNTER — Emergency Department (HOSPITAL_COMMUNITY)
Admission: EM | Admit: 2011-06-12 | Discharge: 2011-06-12 | Disposition: A | Discharge status: Home / Self Care | Payer: PRIVATE HEALTH INSURANCE | Attending: Emergency Medicine | Admitting: Emergency Medicine

## 2011-06-12 DIAGNOSIS — Z859 Personal history of malignant neoplasm, unspecified: Secondary | ICD-10-CM | POA: Insufficient documentation

## 2011-06-12 DIAGNOSIS — M542 Cervicalgia: Secondary | ICD-10-CM | POA: Insufficient documentation

## 2011-06-12 DIAGNOSIS — S139XXA Sprain of joints and ligaments of unspecified parts of neck, initial encounter: Secondary | ICD-10-CM | POA: Insufficient documentation

## 2011-06-12 DIAGNOSIS — X58XXXA Exposure to other specified factors, initial encounter: Secondary | ICD-10-CM | POA: Insufficient documentation

## 2011-06-12 DIAGNOSIS — S161XXA Strain of muscle, fascia and tendon at neck level, initial encounter: Secondary | ICD-10-CM

## 2011-06-12 DIAGNOSIS — Z87891 Personal history of nicotine dependence: Secondary | ICD-10-CM | POA: Insufficient documentation

## 2011-06-12 MED ORDER — OXYCODONE-ACETAMINOPHEN 5-325 MG PO TABS: 1.0000 | ORAL_TABLET | ORAL | Status: AC | PRN

## 2011-06-12 MED ORDER — PREDNISONE 10 MG PO TABS: ORAL_TABLET | ORAL | Status: AC

## 2011-06-12 MED ORDER — CYCLOBENZAPRINE HCL 10 MG PO TABS: 10.0000 mg | ORAL_TABLET | Freq: Three times a day (TID) | ORAL | Status: AC | PRN

## 2011-06-12 NOTE — ED Provider Notes (Signed)
History     CSN: [id number] Arrival date & time: 06/12/2011  8:35 AM  Chief Complaint  Patient presents with  . Neck Pain    HPI  (Consider location/radiation/quality/duration/timing/severity/associated sxs/prior treatment)  HPI Comments: Patient returns to Ed requesting re-evaluation for neck pain.  He was seen here on 06/05/11 for same and had MR of the neck.  States the pain has improved, but still having difficulty moving his neck and states that certain movements will cause a "rush" of pain.  He denies headaches, chest pain, dizziness, weakness or visual changes.    Patient is a 60 y.o. male presenting with neck pain. The history is provided by the patient.  Neck Pain  This is a chronic problem. The current episode started more than 1 week ago. The problem occurs constantly. The problem has been gradually improving. The pain is associated with an unknown factor. There has been no fever. The pain is present in the left side and right side. The quality of the pain is described as shooting and aching. The pain does not radiate. The pain is moderate. The symptoms are aggravated by bending, twisting and position. The pain is the same all the time. Pertinent negatives include no photophobia, no chest pain, no syncope, no numbness, no headaches, no bowel incontinence, no bladder incontinence, no leg pain, no paresis, no tingling and no weakness. He has tried analgesics, ice, heat and muscle relaxants for the symptoms. The treatment provided significant relief.    Past Medical History  Diagnosis Date  . Cancer     Past Surgical History  Procedure Date  . Nephrectomy     right due to cancer    History reviewed. No pertinent family history.  History  Substance Use Topics  . Smoking status: Former Games developer  . Smokeless tobacco: Not on file  . Alcohol Use: Yes     occasionally      Review of Systems  Review of Systems  Constitutional: Negative for appetite change.  Eyes: Negative  for photophobia, pain and visual disturbance.  Respiratory: Negative for cough, chest tightness and shortness of breath.   Cardiovascular: Negative for chest pain and syncope.  Gastrointestinal: Negative for bowel incontinence.  Genitourinary: Negative for bladder incontinence.  Musculoskeletal: Positive for myalgias and arthralgias. Negative for back pain, joint swelling and gait problem.  Skin: Negative.   Neurological: Negative for dizziness, tingling, facial asymmetry, speech difficulty, weakness, numbness and headaches.  Hematological: Does not bruise/bleed easily.  All other systems reviewed and are negative.    Allergies  Nsaids and Meperidine hcl  Home Medications   Current Outpatient Rx  Name Route Sig Dispense Refill  . CYCLOBENZAPRINE HCL 5 MG PO TABS Oral Take 1-2 tablets (5-10 mg total) by mouth 3 (three) times daily as needed for muscle spasms. 30 tablet 0  . IBUPROFEN 800 MG PO TABS Oral Take 1 tablet (800 mg total) by mouth 3 (three) times daily. For 5 days, then PRN pain 30 tablet 0  . OXYCODONE-ACETAMINOPHEN 5-325 MG PO TABS Oral Take 1-2 tablets by mouth every 4 (four) hours as needed for pain. 30 tablet 0    Physical Exam    Pulse 75  Temp(Src) 98.1 F (36.7 C) (Oral)  Resp 18  Ht 5\' 11"  (1.803 m)  Wt 184 lb (83.462 kg)  BMI 25.66 kg/m2  SpO2 96%  Physical Exam  Nursing note and vitals reviewed. Constitutional: He is oriented to person, place, and time. He appears well-developed and well-nourished. No  distress.  HENT:  Head: Normocephalic and atraumatic.  Mouth/Throat: Oropharynx is clear and moist.  Eyes: EOM are normal. Pupils are equal, round, and reactive to light.  Neck: Trachea normal. No JVD present. Spinous process tenderness and muscular tenderness present. No rigidity. Decreased range of motion present. No edema and no erythema present. Brudzinski's sign noted. No Kernig's sign noted. No thyromegaly present.  Cardiovascular: Normal rate,  regular rhythm and normal heart sounds.   Pulmonary/Chest: Effort normal and breath sounds normal.  Musculoskeletal: He exhibits tenderness. He exhibits no edema.  Neurological: He is alert and oriented to person, place, and time. He has normal strength and normal reflexes. He displays normal reflexes. No cranial nerve deficit or sensory deficit. He exhibits normal muscle tone. Coordination and gait normal.  Reflex Scores:      Tricep reflexes are 2+ on the right side and 2+ on the left side.      Bicep reflexes are 2+ on the right side and 2+ on the left side.      Brachioradialis reflexes are 2+ on the right side and 2+ on the left side. Skin: Skin is warm and dry.  Psychiatric: He has a normal mood and affect.    ED Course  Procedures (including critical care time)  Labs Reviewed - No data to display Dg Cervical Spine Complete  06/05/2011  *RADIOLOGY REPORT*  Clinical Data: Neck pain  CERVICAL SPINE - COMPLETE 4+ VIEW  Comparison: None.  Findings: The cervical spine is visualized to the bottom of C7 on the lateral view.  Reversal of the normal cervical lordosis.  No evidence of fracture or dislocation. The vertebral body heights are maintained.  The dens appears intact.  The lateral masses of C1 are symmetric.  No prevertebral soft tissue swelling.  Multilevel degenerative changes.  Visualized lung apices are clear.  IMPRESSION: No fracture or dislocation is seen.  Multilevel degenerative changes.  Original Report Authenticated By: Charline Bills, M.D.   Mr Cervical Spine Wo Contrast  06/05/2011  *RADIOLOGY REPORT*  Clinical Data: Neck pain radiating into both arms.  History of renal cell carcinoma.  MRI CERVICAL SPINE WITHOUT CONTRAST  Technique:  Multiplanar and multiecho pulse sequences of the cervical spine, to include the craniocervical junction and cervicothoracic junction, were obtained according to standard protocol without intravenous contrast.  Comparison: None.  Findings:  Straightening of the normal cervical lordosis is noted. Vertebral body height and alignment are maintained.  No worrisome marrow lesion.  The craniocervical junction is normal and cervical cord signal is normal.  Imaged paraspinous structures are unremarkable.  C2-3:  Mild disc bulge without central canal or foraminal narrowing.  C3-4:  Shallow central protrusion effaces the ventral thecal sac. No cord deformity.  Uncovertebral disease causes mild to moderate foraminal narrowing, greater on the right.  C4-5:  There is some posterior bony ridging and uncovertebral disease.  Ventral thecal sac is narrowed but not effaced.  Foramina are open.  C5-6:  Small disc osteophyte complex with uncovertebral disease. Central canal and left foramen are mildly narrowed.  Right foramen open.  C6-7:  Disc osteophyte complex eccentric to the left with uncovertebral disease.  Ventral thecal sac is effaced.  Moderate left foraminal narrowing is identified.  Right foramen open.  C7-T1:  Negative.  IMPRESSION:  1.  Shallow central protrusion at C3-4 effaces the ventral thecal sac.  No cord deformity. 2.  Mild central canal and left foraminal narrowing C5-6. 3.  Moderate left foraminal narrowing C6-7.  Disc  at this level effaces the ventral thecal sac.  Original Report Authenticated By: Bernadene Bell. D'ALESSIO, M.D.       MDM   9:31 AM Patient continues to have neck pain although pain has improved.  No focal neuro deficits on exam today.  Moves all extremities well.  Has moderate ttp of the cervical paraspinal muscles.  Radial pulses are strong and equal bilaterally.  Sensation intact.    I have reviewed the patient's MR results from his previous visit.  I will give him a referral to [facility] and extend his work note.  Will also try short course of steroids.    Medical screening examination/treatment/procedure(s) were performed by non-physician practitioner and as supervising physician I was immediately  available for consultation/collaboration. Osvaldo Human, M.D.     Tammy L. [place], [place] 06/12/11 0941  Carleene Cooper III, MD 06/12/11 [phone]

## 2011-06-12 NOTE — ED Notes (Signed)
Pt c/o neck pain x 1 week. Pt states that it is better than it was a week ago but he is still having pain with turning his head and movement. Pt seen in ED 1 week ago and had MRI done. States that the pain meds and muscle relaxants helped some but pain is still there.

## 2011-06-12 NOTE — ED Notes (Signed)
Pt alert and oriented x 3. Skin warm and dry. Color pink. Breath sounds clear and equal bilaterally. Denies pain, numbness or tingling in his arms. C/o worsening pain with movement.

## 2011-07-02 ENCOUNTER — Ambulatory Visit (HOSPITAL_COMMUNITY)
Admission: RE | Admit: 2011-07-02 | Discharge: 2011-07-02 | Disposition: A | Discharge status: Home / Self Care | Payer: PRIVATE HEALTH INSURANCE | Source: Ambulatory Visit | Attending: Urology | Admitting: Urology

## 2011-07-02 ENCOUNTER — Other Ambulatory Visit (HOSPITAL_COMMUNITY): Payer: Self-pay | Admitting: Urology

## 2011-07-02 DIAGNOSIS — C649 Malignant neoplasm of unspecified kidney, except renal pelvis: Secondary | ICD-10-CM | POA: Insufficient documentation

## 2011-07-02 LAB — DIFFERENTIAL
Basophils Relative: 0
Eosinophils Absolute: 0.1
Lymphs Abs: 0.6 — ABNORMAL LOW
Monocytes Absolute: 0.7
Monocytes Relative: 11
Neutro Abs: 5.5

## 2011-07-02 LAB — CBC
Hemoglobin: 13.9
MCHC: 36.1 — ABNORMAL HIGH
MCV: 95.6
RBC: 4.03 — ABNORMAL LOW
WBC: 7

## 2011-07-02 LAB — BASIC METABOLIC PANEL
CO2: 13 — ABNORMAL LOW
Chloride: 102
GFR calc Af Amer: >60
Potassium: 3.8
Sodium: 139

## 2011-07-02 LAB — POCT CARDIAC MARKERS: Troponin i, poc: <0.05

## 2012-03-12 ENCOUNTER — Emergency Department (HOSPITAL_COMMUNITY): Payer: BC Managed Care – PPO

## 2012-03-12 ENCOUNTER — Emergency Department (HOSPITAL_COMMUNITY)
Admission: EM | Admit: 2012-03-12 | Discharge: 2012-03-13 | Disposition: A | Discharge status: Home / Self Care | Payer: BC Managed Care – PPO | Attending: Emergency Medicine | Admitting: Emergency Medicine

## 2012-03-12 ENCOUNTER — Encounter (HOSPITAL_COMMUNITY): Payer: Self-pay | Admitting: *Deleted

## 2012-03-12 DIAGNOSIS — M25461 Effusion, right knee: Secondary | ICD-10-CM

## 2012-03-12 DIAGNOSIS — Z888 Allergy status to other drugs, medicaments and biological substances status: Secondary | ICD-10-CM | POA: Insufficient documentation

## 2012-03-12 DIAGNOSIS — Z905 Acquired absence of kidney: Secondary | ICD-10-CM | POA: Insufficient documentation

## 2012-03-12 DIAGNOSIS — Z85528 Personal history of other malignant neoplasm of kidney: Secondary | ICD-10-CM | POA: Insufficient documentation

## 2012-03-12 DIAGNOSIS — M25469 Effusion, unspecified knee: Secondary | ICD-10-CM | POA: Insufficient documentation

## 2012-03-12 DIAGNOSIS — M009 Pyogenic arthritis, unspecified: Secondary | ICD-10-CM | POA: Insufficient documentation

## 2012-03-12 DIAGNOSIS — L0390 Cellulitis, unspecified: Secondary | ICD-10-CM

## 2012-03-12 DIAGNOSIS — M109 Gout, unspecified: Secondary | ICD-10-CM | POA: Insufficient documentation

## 2012-03-12 DIAGNOSIS — M25569 Pain in unspecified knee: Secondary | ICD-10-CM | POA: Insufficient documentation

## 2012-03-12 DIAGNOSIS — Z87891 Personal history of nicotine dependence: Secondary | ICD-10-CM | POA: Insufficient documentation

## 2012-03-12 LAB — CBC WITH DIFFERENTIAL/PLATELET
Basophils Absolute: 0 10*3/uL (ref 0.0–0.1)
Basophils Relative: 0 % (ref 0–1)
Eosinophils Absolute: 0.1 10*3/uL (ref 0.0–0.7)
Eosinophils Relative: 1 % (ref 0–5)
HCT: 34.5 % — ABNORMAL LOW (ref 39.0–52.0)
Lymphocytes Relative: 6 % — ABNORMAL LOW (ref 12–46)
MCHC: 34.5 g/dL (ref 30.0–36.0)
MCV: 103.9 fL — ABNORMAL HIGH (ref 78.0–100.0)
Monocytes Absolute: 0.8 10*3/uL (ref 0.1–1.0)
Platelets: 90 10*3/uL — ABNORMAL LOW (ref 150–400)
RDW: 12.9 % (ref 11.5–15.5)
WBC: 6.2 10*3/uL (ref 4.0–10.5)

## 2012-03-12 LAB — BASIC METABOLIC PANEL
Calcium: 9.5 mg/dL (ref 8.4–10.5)
Creatinine, Ser: 1.54 mg/dL — ABNORMAL HIGH (ref 0.50–1.35)
GFR calc Af Amer: 55 mL/min — ABNORMAL LOW (ref >90)
GFR calc non Af Amer: 47 mL/min — ABNORMAL LOW (ref >90)
Sodium: 132 mEq/L — ABNORMAL LOW (ref 135–145)

## 2012-03-12 MED ORDER — BUPIVACAINE HCL (PF) 0.5 % IJ SOLN
10.0000 mL | Freq: Once | INTRAMUSCULAR | Status: AC
  Administered 2012-03-12: 10 mL

## 2012-03-12 MED ORDER — MORPHINE SULFATE 4 MG/ML IJ SOLN
4.0000 mg | Freq: Once | INTRAMUSCULAR | Status: AC
  Administered 2012-03-12: 4 mg via INTRAVENOUS
  Filled 2012-03-12: qty 1

## 2012-03-12 MED ORDER — VANCOMYCIN HCL IN DEXTROSE 1-5 GM/200ML-% IV SOLN
1000.0000 mg | Freq: Once | INTRAVENOUS | Status: AC
  Administered 2012-03-12: 1000 mg via INTRAVENOUS
  Filled 2012-03-12: qty 200

## 2012-03-12 MED ORDER — BUPIVACAINE HCL (PF) 0.5 % IJ SOLN
INTRAMUSCULAR | Status: AC
  Filled 2012-03-12: qty 30

## 2012-03-12 MED ORDER — OXYCODONE-ACETAMINOPHEN 5-325 MG PO TABS
1.0000 | ORAL_TABLET | Freq: Once | ORAL | Status: AC
  Administered 2012-03-12: 1 via ORAL
  Filled 2012-03-12: qty 1

## 2012-03-12 NOTE — ED Notes (Signed)
Gram stain results called over from lab. Results given to Burgess Amor, PA

## 2012-03-12 NOTE — ED Provider Notes (Signed)
History     CSN: [id number]  Arrival date & time 03/12/12  1916   First MD Initiated Contact with Patient 03/12/12 1938      Chief Complaint  Patient presents with  . Knee Pain    (Consider location/radiation/quality/duration/timing/severity/associated sxs/prior treatment) HPI Comments: Jeffrey Trujillo presents with a 1 week history increasing pain and swelling in his right knee along with increasing redness of his anterior knee and lower leg.  He denies injuries,  Falls or any skin trauma.  He does have a history of gout,  But in his right great toe and his right elbow,  Never his knee.  He has tried a 2 day course of indocin without relief.  He denies fevers and chills,  No nausea, vomiting or other complaint.  The history is provided by the patient and the spouse.    Past Medical History  Diagnosis Date  . Cancer   . Gout     Past Surgical History  Procedure Date  . Nephrectomy     right due to cancer  . Hernia repair   . Lung removal, partial     History reviewed. No pertinent family history.  History  Substance Use Topics  . Smoking status: Former Games developer  . Smokeless tobacco: Not on file  . Alcohol Use: Yes     occasionally      Review of Systems  Musculoskeletal: Positive for joint swelling and arthralgias.  Skin: Positive for color change. Negative for wound.  Neurological: Negative for weakness and numbness.    Allergies  Nsaids and Meperidine hcl  Home Medications   Current Outpatient Rx  Name Route Sig Dispense Refill  . INDOMETHACIN 50 MG PO CAPS Oral Take 50 mg by mouth as needed. For gout flares    . ARTHRITIS HOT EX Apply externally Apply 1 application topically as needed. For pain/inflammation    . UNKNOWN TO PATIENT Oral Take 2 tablets by mouth every morning. For NECK: UNKNOWN NAME *Filled at [facility], [place], [place]    . DOXYCYCLINE HYCLATE 100 MG PO CAPS Oral Take 1 capsule (100 mg total) by mouth 2 (two) times  daily. 20 capsule 0  . OXYCODONE-ACETAMINOPHEN 5-325 MG PO TABS Oral Take 1 tablet by mouth every 4 (four) hours as needed for pain. 6 tablet 0  . OXYCODONE-ACETAMINOPHEN 5-325 MG PO TABS Oral Take 1 tablet by mouth every 4 (four) hours as needed for pain. 20 tablet 0    BP 129/67  Pulse 77  Temp 99.2 F (37.3 C) (Oral)  Resp 20  Ht 5\' 11"  (1.803 m)  Wt 186 lb (84.369 kg)  BMI 25.94 kg/m2  SpO2 97%  Physical Exam  Constitutional: He appears well-developed and well-nourished.  HENT:  Head: Atraumatic.  Neck: Normal range of motion.  Cardiovascular:       Pulses equal bilaterally  Musculoskeletal: He exhibits tenderness.       Right knee: He exhibits effusion. tenderness found.       Pain with attempts to flex knee,  Moderate effusion appreciated with erythema and increased warmth from patella to upper anterior tibia.  Erythema and increased warmth from mid anterior patella to mid anterior tibia measuring 24 cm x 10 cm.    Neurological: He is alert. He has normal strength. He displays normal reflexes. No sensory deficit.       Equal strength  Skin: Skin is warm and dry.  Psychiatric: He has a normal mood and affect.  ED Course  Apiration of blood/fluid Date/Time: 03/12/2012 8:20 PM Performed by: Burgess Amor Authorized by: Burgess Amor Consent: Verbal consent obtained. Risks and benefits: risks, benefits and alternatives were discussed Consent given by: patient Patient identity confirmed: verbally with patient Time out: Immediately prior to procedure a "time out" was called to verify the correct patient, procedure, equipment, support staff and site/side marked as required. Preparation: Patient was prepped and draped in the usual sterile fashion. Local anesthesia used: yes Anesthesia: local infiltration Local anesthetic: bupivacaine 0.5% without epinephrine Anesthetic total: 3 ml Patient tolerance: Patient tolerated the procedure well with no immediate  complications. Comments: 30 cc of cloudy and bloody joint aspirate obtained.   (including critical care time)   Dg Knee Complete 4 Views Right  03/12/2012  *RADIOLOGY REPORT*  Clinical Data: Right knee pain.  Swelling.  Erythema.  RIGHT KNEE - COMPLETE 4+ VIEW  Comparison: None.  Findings: Cross-table lateral view is off axis and oblique, reducing diagnostic sensitivity.  However, a moderate knee effusion is demonstrated, and there is also abnormal the patellar soft tissue swelling.  An underlying fracture is not visualized.  IMPRESSION:  1.  Prepatellar edema or prepatellar bursitis. 2.  Moderate knee effusion.  Original Report Authenticated By: Dellia Cloud, M.D.   Results for orders placed during the hospital encounter of 03/12/12  [facility] STAIN      Component Value Range   Specimen Description KNEE     Special Requests Normal     Gram Stain       Value: ABUNDANT WBC PRESENT, PREDOMINANTLY PMN  RARE MONONUCLEAR CELLS SEEN NO ORGANISMS SEEN Gram Stain Report Called to,Read Back By and Verified With: MCKINNEY A ON 03/12/2012 AT 2254 BY MILES A   Report Status 03/13/2012 FINAL    CBC WITH DIFFERENTIAL      Component Value Range   WBC 6.2  4.0 - 10.5 K/uL   RBC 3.32 (*) 4.22 - 5.81 MIL/uL   Hemoglobin 11.9 (*) 13.0 - 17.0 g/dL   HCT 40.9 (*) 81.1 - 91.4 %   MCV 103.9 (*) 78.0 - 100.0 fL   MCH 35.8 (*) 26.0 - 34.0 pg   MCHC 34.5  30.0 - 36.0 g/dL   RDW 78.2  95.6 - 21.3 %   Platelets 90 (*) 150 - 400 K/uL   Neutrophils Relative 80 (*) 43 - 77 %   Neutro Abs 5.0  1.7 - 7.7 K/uL   Lymphocytes Relative 6 (*) 12 - 46 %   Lymphs Abs 0.4 (*) 0.7 - 4.0 K/uL   Monocytes Relative 13 (*) 3 - 12 %   Monocytes Absolute 0.8  0.1 - 1.0 K/uL   Eosinophils Relative 1  0 - 5 %   Eosinophils Absolute 0.1  0.0 - 0.7 K/uL   Basophils Relative 0  0 - 1 %   Basophils Absolute 0.0  0.0 - 0.1 K/uL  BASIC METABOLIC PANEL      Component Value Range   Sodium 132 (*) 135 - 145 mEq/L   Potassium 3.8   3.5 - 5.1 mEq/L   Chloride 97  96 - 112 mEq/L   CO2 23  19 - 32 mEq/L   Glucose, Bld 115 (*) 70 - 99 mg/dL   BUN 22  6 - 23 mg/dL   Creatinine, Ser 0.86 (*) 0.50 - 1.35 mg/dL   Calcium 9.5  8.4 - 57.8 mg/dL   GFR calc non Af Amer 47 (*) >90 mL/min   GFR calc  Af Amer 55 (*) >90 mL/min  CELL COUNT + DIFF,  W/ CRYST-SYNVL FLD      Component Value Range   Color, Synovial RED (*) YELLOW   Appearance-Synovial CLOUDY (*) CLEAR   Crystals, Fluid NO CRYSTALS SEEN     WBC, Synovial 16109 (*) 0 - 200 /cu mm   Neutrophil, Synovial 97 (*) 0 - 25 %   Lymphocytes-Synovial Fld 1  0 - 20 %   Monocyte-Macrophage-Synovial Fluid 1 (*) 50 - 90 %   Eosinophils-Synovial 1  0 - 1 %   Other Cells-SYN PENDING        1. Cellulitis   2. Knee effusion, right     IV vancomycin 1 gram given prior to dc home.  Pt also obtained moderate but short lived pain relief with morphine 4 mg .  Given dilaudid 1 mg IV prior to dc home along with percocet prepack.   Gram stain - abundant polymononuclear cells, rare mononuclear cells,   No organisms seen.  MDM  Prescribed doxycycline 100 mg bid,  Percocet.  Encouraged to continue indocin which he has.  Recheck by pcp in 2 days,  Or sooner if sx worsen.  Return here if unable to see pcp.   Rest,  Warm compresses.  Skin marker used to outline area of cellulitis.        Burgess Amor, [place] 03/13/12 0210

## 2012-03-12 NOTE — ED Notes (Signed)
Rt knee red, swollen for 1 week  No injury

## 2012-03-13 LAB — GRAM STAIN: Special Requests: NORMAL

## 2012-03-13 LAB — SYNOVIAL CELL COUNT + DIFF, W/ CRYSTALS
Eosinophils-Synovial: 1 % (ref 0–1)
Neutrophil, Synovial: 97 % — ABNORMAL HIGH (ref 0–25)

## 2012-03-13 MED ORDER — ONDANSETRON HCL 4 MG/2ML IJ SOLN
4.0000 mg | Freq: Once | INTRAMUSCULAR | Status: AC
  Administered 2012-03-13: 4 mg via INTRAVENOUS
  Filled 2012-03-13: qty 2

## 2012-03-13 MED ORDER — OXYCODONE-ACETAMINOPHEN 5-325 MG PO TABS: 1.0000 | ORAL_TABLET | ORAL | Status: AC | PRN

## 2012-03-13 MED ORDER — HYDROMORPHONE HCL PF 1 MG/ML IJ SOLN
1.0000 mg | Freq: Once | INTRAMUSCULAR | Status: AC
  Administered 2012-03-13: 1 mg via INTRAVENOUS
  Filled 2012-03-13: qty 1

## 2012-03-13 MED ORDER — OXYCODONE-ACETAMINOPHEN 5-325 MG PO TABS: 1.0000 | ORAL_TABLET | ORAL | Status: DC | PRN

## 2012-03-13 MED ORDER — DOXYCYCLINE HYCLATE 100 MG PO CAPS: 100.0000 mg | ORAL_CAPSULE | Freq: Two times a day (BID) | ORAL | Status: AC

## 2012-03-13 MED ORDER — DOXYCYCLINE HYCLATE 100 MG PO CAPS: 100.0000 mg | ORAL_CAPSULE | Freq: Two times a day (BID) | ORAL | Status: DC

## 2012-03-13 NOTE — Discharge Instructions (Signed)
Cellulitis Cellulitis is an infection of the skin and the tissue beneath it. The area is typically red and tender. It is caused by germs (bacteria) (usually staph or strep) that enter the body through cuts or sores. Cellulitis most commonly occurs in the arms or lower legs.  HOME CARE INSTRUCTIONS   If you are given a prescription for medications which kill germs (antibiotics), take as directed until finished.   If the infection is on the arm or leg, keep the limb elevated as able.   Use a warm cloth several times per day to relieve pain and encourage healing.   See your caregiver for recheck of the infected site as directed if problems arise.   Only take over-the-counter or prescription medicines for pain, discomfort, or fever as directed by your caregiver.  SEEK MEDICAL CARE IF:   The area of redness (inflammation) is spreading, there are red streaks coming from the infected site, or if a part of the infection begins to turn dark in color.   The joint or bone underneath the infected skin becomes painful after the skin has healed.   The infection returns in the same or another area after it seems to have gone away.   A boil or bump swells up. This may be an abscess.   New, unexplained problems such as pain or fever develop.  SEEK IMMEDIATE MEDICAL CARE IF:   You have a fever.   You or your child feels drowsy or lethargic.   There is vomiting, diarrhea, or lasting discomfort or feeling ill (malaise) with muscle aches and pains.  MAKE SURE YOU:   Understand these instructions.   Will watch your condition.   Will get help right away if you are not doing well or get worse.  Document Released: 06/13/2005 Document Revised: 08/23/2011 Document Reviewed: 04/21/2008 [facility] Patient Information 2012 [place], [place].Knee Effusion The medical term for having fluid in your knee is effusion. This is often due to an internal derangement of the knee. This means something is wrong inside the  knee. Some of the causes of fluid in the knee may be torn cartilage, a torn ligament, or bleeding into the joint from an injury. Your knee is likely more difficult to bend and move. This is often because there is increased pain and pressure in the joint. The time it takes for recovery from a knee effusion depends on different factors, including:  Type of injury.  Your age.  Physical and medical conditions.  Rehabilitation Strategies.  How long you will be away from your normal activities will depend on what kind of knee problem you have and how much damage is present. Your knee has two types of cartilage. Articular cartilage covers the bone ends and lets your knee bend and move smoothly. Two menisci, thick pads of cartilage that form a rim inside the joint, help absorb shock and stabilize your knee. Ligaments bind the bones together and support your knee joint. Muscles move the joint, help support your knee, and take stress off the joint itself. CAUSES  Often an effusion in the knee is caused by an injury to one of the menisci. This is often a tear in the cartilage. Recovery after a meniscus injury depends on how much meniscus is damaged and whether you have damaged other knee tissue. Small tears may heal on their own with conservative treatment. Conservative means rest, limited weight bearing activity and muscle strengthening exercises. Your recovery may take up to 6 weeks.  TREATMENT  Larger tears may require surgery. Meniscus injuries may be treated during arthroscopy. Arthroscopy is a procedure in which your surgeon uses a small telescope like instrument to look in your knee. Your caregiver can make a more accurate diagnosis (learning what is wrong) by performing an arthroscopic procedure. If your injury is on the inner margin of the meniscus, your surgeon may trim the meniscus back to a smooth rim. In other cases your surgeon will try to repair a damaged meniscus with stitches (sutures). This may make  rehabilitation take longer, but may provide better long term result by helping your knee keep its shock absorption capabilities. Ligaments which are completely torn usually require surgery for repair. HOME CARE INSTRUCTIONS Use crutches as instructed.  If a brace is applied, use as directed.  Once you are home, an ice pack applied to your swollen knee may help with discomfort and help decrease swelling.  Keep your knee raised (elevated) when you are not up and around or on crutches.  Only take over-the-counter or prescription medicines for pain, discomfort, or fever as directed by your caregiver.  Your caregivers will help with instructions for rehabilitation of your knee. This often includes strengthening exercises.  You may resume a normal diet and activities as directed.  SEEK MEDICAL CARE IF:  There is increased swelling in your knee.  You notice redness, swelling, or increasing pain in your knee.  An unexplained oral temperature above 102 F (38.9 C) develops.  SEEK IMMEDIATE MEDICAL CARE IF:  You develop a rash.  You have difficulty breathing.  You have any allergic reactions from medications you may have been given.  There is severe pain with any motion of the knee.  MAKE SURE YOU:  Understand these instructions.  Will watch your condition.  Will get help right away if you are not doing well or get worse.  Document Released: 11/24/2003 Document Revised: 08/23/2011 Document Reviewed: 01/28/2008 [facility] Patient Information 2012 [place], [place].   Use the medicines prescribed, taking your next dose of the antibiotic tomorrow morning.  Do not drive within 4 hours of taking the pain medicine as this will make you drowsy.  Elevation and warm compresses may also help with pain and swelling.  I do recommend you continue taking your indocin until your followup.

## 2012-03-16 LAB — BODY FLUID CULTURE

## 2012-03-17 MED FILL — Oxycodone w/ Acetaminophen Tab 5-325 MG: ORAL | Qty: 6 | Status: AC

## 2012-03-24 NOTE — ED Provider Notes (Signed)
Medical screening examination/treatment/procedure(s) were conducted as a shared visit with non-physician practitioner(s) and myself.  I personally evaluated the patient during the encounter.  Right knee swelling.  Tap shows no organisms.  nv intact  Donnetta Hutching, MD 03/24/12 [phone]

## 2012-07-04 ENCOUNTER — Other Ambulatory Visit (HOSPITAL_COMMUNITY): Payer: Self-pay | Admitting: Urology

## 2012-07-04 ENCOUNTER — Ambulatory Visit (HOSPITAL_COMMUNITY)
Admission: RE | Admit: 2012-07-04 | Discharge: 2012-07-04 | Disposition: A | Discharge status: Home / Self Care | Payer: PRIVATE HEALTH INSURANCE | Source: Ambulatory Visit | Attending: Urology | Admitting: Urology

## 2012-07-04 DIAGNOSIS — J984 Other disorders of lung: Secondary | ICD-10-CM | POA: Insufficient documentation

## 2012-07-04 DIAGNOSIS — C649 Malignant neoplasm of unspecified kidney, except renal pelvis: Secondary | ICD-10-CM

## 2012-07-17 ENCOUNTER — Other Ambulatory Visit (HOSPITAL_COMMUNITY): Payer: Self-pay | Admitting: Urology

## 2012-07-17 DIAGNOSIS — N2889 Other specified disorders of kidney and ureter: Secondary | ICD-10-CM

## 2012-07-18 ENCOUNTER — Ambulatory Visit (HOSPITAL_COMMUNITY)
Admission: RE | Admit: 2012-07-18 | Discharge: 2012-07-18 | Disposition: A | Discharge status: Home / Self Care | Payer: BC Managed Care – PPO | Source: Ambulatory Visit | Attending: Urology | Admitting: Urology

## 2012-07-18 DIAGNOSIS — N289 Disorder of kidney and ureter, unspecified: Secondary | ICD-10-CM | POA: Insufficient documentation

## 2012-07-18 DIAGNOSIS — N2889 Other specified disorders of kidney and ureter: Secondary | ICD-10-CM

## 2012-07-18 DIAGNOSIS — K7689 Other specified diseases of liver: Secondary | ICD-10-CM | POA: Insufficient documentation

## 2012-07-18 MED ORDER — GADOBENATE DIMEGLUMINE 529 MG/ML IV SOLN
20.0000 mL | Freq: Once | INTRAVENOUS | Status: AC | PRN
  Administered 2012-07-18: 17 mL via INTRAVENOUS

## 2012-07-28 ENCOUNTER — Telehealth: Payer: Self-pay | Admitting: Gastroenterology

## 2012-07-28 ENCOUNTER — Telehealth: Payer: Self-pay | Admitting: Oncology

## 2012-07-28 DIAGNOSIS — C649 Malignant neoplasm of unspecified kidney, except renal pelvis: Secondary | ICD-10-CM

## 2012-07-28 DIAGNOSIS — K8689 Other specified diseases of pancreas: Secondary | ICD-10-CM

## 2012-07-28 NOTE — Telephone Encounter (Signed)
I spoke with Dr Vevelyn Royals office and records are to be sent over

## 2012-07-28 NOTE — Telephone Encounter (Signed)
I spoke with Dr. Laverle Patter About the MRI results. patient has renal cell carcinoma there is probably also involving same side adrenal gland. There is a pancreatic or peripancreatic mass noted on the MRI, this is hypervascular. This should be amenable to endoscopic ultrasound visualization as well as biopsy.   Patty,  can you call Dr. Council Mechanic Borden's  office to get recent H&P, demographics, medicine list sent over. I would like to plan on endoscopic ultrasound next week sometime during the hospital week. This would be 60 minutes, radial plus minus linear, done for pancreatic mass, known renal cell carcinoma. ++propofol (not mon or Friday please)

## 2012-07-28 NOTE — Telephone Encounter (Signed)
Spoke with pt will await return call in re NP appt.

## 2012-07-29 ENCOUNTER — Other Ambulatory Visit: Payer: Self-pay

## 2012-07-29 ENCOUNTER — Telehealth: Payer: Self-pay | Admitting: Oncology

## 2012-07-29 DIAGNOSIS — K8689 Other specified diseases of pancreas: Secondary | ICD-10-CM

## 2012-07-29 DIAGNOSIS — C649 Malignant neoplasm of unspecified kidney, except renal pelvis: Secondary | ICD-10-CM

## 2012-07-29 NOTE — Telephone Encounter (Signed)
Pt has been scheduled for 08/07/12 for EUS Left message on machine to call back

## 2012-07-29 NOTE — Telephone Encounter (Signed)
Received message from Ms. Cheek stating the will keep appt 11/19 @ 1:30 w/[name].  Referring Dr. Laverle Patter Dx- Renal Cell  Welcome packet mailed.

## 2012-07-29 NOTE — Telephone Encounter (Signed)
C/D 07/29/12 for appt.08/05/12

## 2012-07-29 NOTE — Telephone Encounter (Signed)
Pt has been instructed and meds reviewed pt will call with any questions or concerns 

## 2012-07-29 NOTE — Telephone Encounter (Signed)
Call Ann at [phone] ext [phone] to notify when pt is scheduled

## 2012-08-01 ENCOUNTER — Other Ambulatory Visit: Payer: Self-pay | Admitting: Oncology

## 2012-08-01 DIAGNOSIS — C649 Malignant neoplasm of unspecified kidney, except renal pelvis: Secondary | ICD-10-CM

## 2012-08-04 ENCOUNTER — Ambulatory Visit (INDEPENDENT_AMBULATORY_CARE_PROVIDER_SITE_OTHER): Payer: BC Managed Care – PPO | Admitting: General Surgery

## 2012-08-04 ENCOUNTER — Encounter (INDEPENDENT_AMBULATORY_CARE_PROVIDER_SITE_OTHER): Payer: Self-pay | Admitting: General Surgery

## 2012-08-04 VITALS — BP 140/88 | HR 72 | Temp 98.2°F | Resp 16 | Ht 71.0 in | Wt 178.8 lb

## 2012-08-04 DIAGNOSIS — K869 Disease of pancreas, unspecified: Secondary | ICD-10-CM

## 2012-08-04 DIAGNOSIS — K8689 Other specified diseases of pancreas: Secondary | ICD-10-CM | POA: Insufficient documentation

## 2012-08-04 NOTE — Progress Notes (Signed)
Chief complaint:  Pancreatic head mass  HISTORY: Patient is a 61 year old male he is status post laparoscopic right nephrectomy for renal carcinoma around 3 years ago. He was doing well until this last scan when he was found to have a large mass on the left kidney with tumor thrombus in the renal vein.  He was also seen to have an enhancing lesion in the head of the pancreas.  This is seen on MR. His CTs were done at [facility]. Looking back at his prior CT scans is a question of whether or not this pancreatic lesion was there previously. As described on the MRI as being in the pancreaticoduodenal groove. This reported the mass as 3.9 cm.  He denies any nausea or vomiting. He has not had any episodes of flushing. He has not had any significant weight loss.  He is nondiabetic.  Past Medical History  Diagnosis Date  . Cancer   . Gout     Past Surgical History  Procedure Date  . Nephrectomy     right due to cancer  . Hernia repair   . Lung removal, partial     Current Outpatient Prescriptions  Medication Sig Dispense Refill  . indomethacin (INDOCIN) 50 MG capsule Take 50 mg by mouth as needed. For gout flares      . Menthol-Methyl Salicylate (ARTHRITIS HOT EX) Apply 1 application topically as needed. For pain/inflammation      . topiramate (TOPAMAX) 25 MG tablet Take 25 mg by mouth 2 (two) times daily.         Allergies  Allergen Reactions  . Nsaids Other (See Comments)    Patient only has one kidney.   . Meperidine Hcl Hives and Rash     Family History  Problem Relation Age of Onset  . Lung cancer Brother   . Prostate cancer Brother      History   Social History  . Marital Status: Married    Spouse Name: N/A    Number of Children: N/A  . Years of Education: N/A   Social History Main Topics  . Smoking status: Former Games developer  . Smokeless tobacco: None  . Alcohol Use: Yes     Comment: occasionally  . Drug Use: No  . Sexually Active:     REVIEW OF SYSTEMS - PERTINENT  POSITIVES ONLY: 12 point review of systems negative other than HPI and PMH  EXAM: Filed Vitals:   08/04/12 0833  BP: 140/88  Pulse: 72  Temp: 98.2 F (36.8 C)  Resp: 16    Gen:  No acute distress.  Well nourished and well groomed.   Neurological: Alert and oriented to person, place, and time. Coordination normal.  Head: Normocephalic and atraumatic.  Eyes: Conjunctivae are normal. Pupils are equal, round, and reactive to light. No scleral icterus.  Neck: Normal range of motion. Neck supple. No tracheal deviation or thyromegaly present.  Cardiovascular: Normal rate, regular rhythm, normal heart sounds and intact distal pulses.  Exam reveals no gallop and no friction rub.  No murmur heard. Respiratory: Effort normal.  No respiratory distress. No chest wall tenderness. Breath sounds normal.  No wheezes, rales or rhonchi.  GI: Soft. Bowel sounds are normal. The abdomen is soft and nontender.  There is no rebound and no guarding.  Musculoskeletal: Normal range of motion. Extremities are nontender.  Lymphadenopathy: No cervical, preauricular, postauricular or axillary adenopathy is present Skin: Skin is warm and dry. No rash noted. No diaphoresis. No erythema. No pallor. No  clubbing, cyanosis, or edema.   Psychiatric: Normal mood and affect. Behavior is normal. Judgment and thought content normal.    LABORATORY RESULTS: Available labs are reviewed  Cr. 1.37  RADIOLOGY RESULTS: See E-Chart or I-Site for most recent results.  Images and reports are reviewed. MR abdomen/pelvis IMPRESSION:  4.5 cm enhancing mass in the left upper kidney, as described above,  suspicious for solid neoplasm. Suspected renal vein invasion.  Single left renal artery and vein.  1.5 cm enhancing left adrenal lesion, suspicious for metastasis.  Status post right nephrectomy.  3.9 cm enhancing lesion in the pancreaticoduodenal groove, as  described above, worrisome for metastasis, less likely primary    pancreatic (or duodenal) neoplasm. Lesion should be amenable to  biopsy via EUS.     ASSESSMENT AND PLAN: Pancreatic mass The patient has a pancreatic head mass that appears that it might have been there on prior scans. Hopefully this is not an aggressive mass.  Seems more likely that it is locally recurrent right-sided renal cell carcinoma or pancreatic neuroendocrine tumor.  He is having an endoscopic ultrasound on Thursday. Once the pathology is available and he sees nephrology, we will discuss overall plans. He also has an appointment with medical oncology this week.  Should he have a neuroendocrine tumor of the pancreas, it seems reasonable to do a Whipple and left radical nephrectomy simultaneously.  If he has renal cell carcinoma in both places, this would be more of a discussion.  If he were to have adenocarcinoma of the exocrine pancreas, I would not recommend combined surgery given the likelihood of recurrence.    We will discuss them at our conference next week as well.  He is meeting with the nephrologist as well to discuss dialysis.     Maudry Diego MD Surgical Oncology, General and Endocrine Surgery [facility]      Visit Diagnoses: 1. Pancreatic mass     Primary Care Physician: [name], MD

## 2012-08-04 NOTE — Assessment & Plan Note (Signed)
The patient has a pancreatic head mass that appears that it might have been there on prior scans. Hopefully this is not an aggressive mass.  Seems more likely that it is locally recurrent right-sided renal cell carcinoma or pancreatic neuroendocrine tumor.  He is having an endoscopic ultrasound on Thursday. Once the pathology is available and he sees nephrology, we will discuss overall plans. He also has an appointment with medical oncology this week.  Should he have a neuroendocrine tumor of the pancreas, it seems reasonable to do a Whipple and left radical nephrectomy simultaneously.  If he has renal cell carcinoma in both places, this would be more of a discussion.  If he were to have adenocarcinoma of the exocrine pancreas, I would not recommend combined surgery given the likelihood of recurrence.    We will discuss them at our conference next week as well.

## 2012-08-04 NOTE — Patient Instructions (Signed)
Get Endoscopic Ultrasound this Thursday with Dr. Christella Hartigan.  Drs. Sabino Donovan and I will discuss once pathology is back.  Follow up will be determined afterward.

## 2012-08-05 ENCOUNTER — Other Ambulatory Visit (HOSPITAL_BASED_OUTPATIENT_CLINIC_OR_DEPARTMENT_OTHER): Payer: BC Managed Care – PPO | Admitting: Lab

## 2012-08-05 ENCOUNTER — Encounter (HOSPITAL_COMMUNITY): Payer: Self-pay | Admitting: Pharmacy Technician

## 2012-08-05 ENCOUNTER — Ambulatory Visit: Payer: BC Managed Care – PPO

## 2012-08-05 ENCOUNTER — Encounter: Payer: Self-pay | Admitting: Oncology

## 2012-08-05 ENCOUNTER — Ambulatory Visit (HOSPITAL_BASED_OUTPATIENT_CLINIC_OR_DEPARTMENT_OTHER): Payer: BC Managed Care – PPO | Admitting: Oncology

## 2012-08-05 ENCOUNTER — Encounter (HOSPITAL_COMMUNITY): Payer: Self-pay | Admitting: *Deleted

## 2012-08-05 VITALS — BP 138/78 | HR 72 | Temp 96.9°F | Resp 20 | Ht 70.5 in | Wt 182.8 lb

## 2012-08-05 DIAGNOSIS — K869 Disease of pancreas, unspecified: Secondary | ICD-10-CM

## 2012-08-05 DIAGNOSIS — C649 Malignant neoplasm of unspecified kidney, except renal pelvis: Secondary | ICD-10-CM

## 2012-08-05 LAB — COMPREHENSIVE METABOLIC PANEL (CC13)
Albumin: 3.7 g/dL (ref 3.5–5.0)
Alkaline Phosphatase: 118 U/L (ref 40–150)
CO2: 27 mEq/L (ref 22–29)
Chloride: 107 mEq/L (ref 98–107)
Glucose: 98 mg/dl (ref 70–99)
Potassium: 4.2 mEq/L (ref 3.5–5.1)
Sodium: 141 mEq/L (ref 136–145)
Total Protein: 7.6 g/dL (ref 6.4–8.3)

## 2012-08-05 LAB — CBC WITH DIFFERENTIAL/PLATELET
Eosinophils Absolute: 0.1 10*3/uL (ref 0.0–0.5)
MONO#: 0.7 10*3/uL (ref 0.1–0.9)
MONO%: 10.6 % (ref 0.0–14.0)
NEUT#: 4.8 10*3/uL (ref 1.5–6.5)
RBC: 3.46 10*6/uL — ABNORMAL LOW (ref 4.20–5.82)
RDW: 13.2 % (ref 11.0–14.6)
WBC: 6.2 10*3/uL (ref 4.0–10.3)
lymph#: 0.6 10*3/uL — ABNORMAL LOW (ref 0.9–3.3)

## 2012-08-05 NOTE — Progress Notes (Signed)
08-05-12 1645 instructed. Teach back used. W. Kennon Portela

## 2012-08-05 NOTE — Progress Notes (Signed)
Checked in new patient. No financial issues. Patient doesn't want any treatment, I advised him to discuss all of that with the doctor.

## 2012-08-05 NOTE — Progress Notes (Signed)
Note dictated

## 2012-08-06 NOTE — Progress Notes (Signed)
CC:   Jeffrey Comstock, MD Jeffrey Borden, MD Jeffrey Byerly, MD  REASON FOR CONSULTATION:  Kidney cancer.  HISTORY OF PRESENT ILLNESS:  This is a pleasant 60-year-old gentleman, currently of [place], , where he lived the majority of his life.  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  He is a relatively healthy gentleman without any really significant comorbid condition. He does have gout and treated occasionally for that.  His history of kidney cancer dates back to October 2010.  At that time, he had presented with a right kidney mass and underwent a radical nephrectomy on June 27, 2009. The pathological staging was T1b, was clear cell histology with Fuhrman grade 3/4.  Surgical margins were negative.  The patient had been followed since that time by Dr. Borden on an intermittent basis, and remained disease free until recently when was found to have a solid 2.8 cm mass in the central upper pole of the left kidney involving the renal sinus, suspicious for renal cell carcinoma.  An MRI of the abdomen was done on 07/18/2012, which showed a 4.5 x 2 7 x 3.6 irregular enhancing lesion of the left upper kidney.  No regional lymphadenopathy.  There is a 1.5 x 1.3 left adrenal lesion.  There is also a 2.6 x 3.0 x 3.9 cm enhancing lesion in the pancreatoduodenal groove. The patient at that time continued to be asymptomatic.  He was not reporting any specific symptoms related to this.  He has not reported any abdominal pain.  He had not reported any major complaints at this time.  The patient was referred for an endoscopic ultrasound, which is to be done this Thursday and he was also evaluated by Dr. Mattingly from nephrology in preparation for possible dialysis if indeed he elected with a nephrectomy.  He was also evaluated by Dr. Byerly, pancreatic surgery, for consideration of possible surgery if felt that would be needed.  As mentioned, Mr.  Trujillo continues to be asymptomatic at this time.  He had not reported any abdominal pain.  He had not reported any constipation. Had not reported any diarrhea.  REVIEW OF SYSTEMS:  Did not report any headaches, blurry vision, double vision.  He did not report any motor or sensory neuropathy.  He did not report any alteration in mental status. Did not report any psychiatric issues or depression.  He does not report any chest pain, orthopnea, PND, palpitation.  He did not report any cough or hemoptysis.  Did not report any nausea or vomiting.  He did not report any abdominal pain. Did not report any hematochezia or melena.  Rest of review of systems unremarkable.  PAST MEDICAL HISTORY:  Significant for history of anxiety, gout, history of hernia repair, history of a radical nephrectomy, and history of thoracotomy for a lung cyst.  MEDICATION:  Reviewed today.  He is on Topamax and indomethacin.  ALLERGIES:  Allergic to NSAIDs and Demerol.  SOCIAL HISTORY:  He is married.  He has 2 daughters.  She denies any alcohol abuse.  He drinks about 1 beer a day.  He denied any tobacco use.  FAMILY HISTORY:  He has 4 brothers, 3 of them had lung cancer, 1 had prostate cancer.  PHYSICAL EXAMINATION:  Alert, awake, pleasant gentleman, appeared in no active distress today.  His blood pressure is 138/78, pulse 72, respiration 20, weighs 182 pounds.  HEENT:  Head is normocephalic, atraumatic.  Pupils equal, round, reactive to   light.  Oral mucosa moist and pink.  Neck:  Supple without adenopathy.  Heart:  Regular rate and rhythm.  S1, S2.  Lungs:  Clear to auscultation, no rhonchi or wheeze. No dullness to percussion.  Abdomen:  Soft, nontender.  No hepatosplenomegaly.  Extremities:  No clubbing, cyanosis, or edema. Neurologically:  Intact motor, sensory and deep tendon reflexes.  LABORATORY DATA:  Hemoglobin of 12, white cell count 6.2, platelet count of 214.  Normal  differential.  ASSESSMENT AND PLAN:  A 60-year-old gentleman with the following issues: 1. A history of renal cell carcinoma.  He had stage T1b diagnosed in     October 2010 of the right kidney, status post right nephrectomy,     now presents with a solid tumor of the left kidney, confirmed by     imaging studies including an MRI, that is measuring 4.5 x 2.7 x 3.6     cm with associated left adrenal lesion.  The patient's case was     discussed in the genitourinary multidisciplinary board.  I     discussed these findings with Dr. Borden as well as Mr. An     today.  The treatment options at this time would include radical     surgery which Dr. Borden has explained in details,  risks and     benefits, including the rendering Jeffrey Trujillo dialysis dependent,     and certainly would indicate increase his chances of a prolonged     disease-free interval and definitely improve his overall survival.     But obviously the downside of that, the morbidity associated with     hemodialysis and again he met with Dr. Mattingly and discussed that     as well.  The alternative to that I discussed with him today would     be systemic therapy up front and use radical surgery to a later     date.  I have discussed with him the natural course of a treated     advanced kidney cancer with TKI or mTOR inhibitors and risks and     benefits of these drugs were discussed.  At that time, his     preference is to go with radical surgery if it is at all possible. 2. A pancreatic lesion.  At this time, the differential diagnosis     would include metastatic renal cell carcinoma, neuroendocrine tumor     of the pancreas versus adenocarcinoma of the pancreas versus a     benign tumor which I think is less likely.  I do agree that the 1st     step is to really identify whether this is indeed a metastatic     renal cell versus a new primary.  He is having an endoscopic     ultrasound and once that is completed, I  think that will help our     decision-making process.  If he has adenocarcinoma, I agree with     Dr. Byerly.  I do not think a debulking surgery would be warranted,     but if it is a metastatic lesion versus an endocrine or     neuroendocrine tumor of the pancreas, then surgery would be a     possibility  at that time. And, again I introduced with him the     concept of possibly using systemic therapy up front and reserving     surgery to a later date.  For the time,  I will   give my final     recommendation once the pathology of the pancreatic tumor is     available.  We will contact him after discussion with other     specialists.    ______________________________ Zairah Arista N Halvor Behrend, M.D. FNS/MEDQ  D:  08/05/2012  T:  08/06/2012  Job:  [id number] 

## 2012-08-07 ENCOUNTER — Encounter (HOSPITAL_COMMUNITY): Payer: Self-pay | Admitting: Anesthesiology

## 2012-08-07 ENCOUNTER — Ambulatory Visit (HOSPITAL_COMMUNITY): Payer: BC Managed Care – PPO | Admitting: Anesthesiology

## 2012-08-07 ENCOUNTER — Encounter (HOSPITAL_COMMUNITY)
Admission: RE | Disposition: A | Discharge status: Home / Self Care | Payer: Self-pay | Source: Ambulatory Visit | Attending: Gastroenterology

## 2012-08-07 ENCOUNTER — Encounter (HOSPITAL_COMMUNITY): Payer: Self-pay | Admitting: Gastroenterology

## 2012-08-07 ENCOUNTER — Ambulatory Visit (HOSPITAL_COMMUNITY)
Admission: RE | Admit: 2012-08-07 | Discharge: 2012-08-07 | Disposition: A | Discharge status: Home / Self Care | Payer: BC Managed Care – PPO | Source: Ambulatory Visit | Attending: Gastroenterology | Admitting: Gastroenterology

## 2012-08-07 DIAGNOSIS — K8689 Other specified diseases of pancreas: Secondary | ICD-10-CM | POA: Insufficient documentation

## 2012-08-07 DIAGNOSIS — Z905 Acquired absence of kidney: Secondary | ICD-10-CM | POA: Insufficient documentation

## 2012-08-07 DIAGNOSIS — E278 Other specified disorders of adrenal gland: Secondary | ICD-10-CM | POA: Insufficient documentation

## 2012-08-07 DIAGNOSIS — D4959 Neoplasm of unspecified behavior of other genitourinary organ: Secondary | ICD-10-CM | POA: Insufficient documentation

## 2012-08-07 DIAGNOSIS — C649 Malignant neoplasm of unspecified kidney, except renal pelvis: Secondary | ICD-10-CM | POA: Insufficient documentation

## 2012-08-07 DIAGNOSIS — C259 Malignant neoplasm of pancreas, unspecified: Secondary | ICD-10-CM | POA: Insufficient documentation

## 2012-08-07 DIAGNOSIS — K869 Disease of pancreas, unspecified: Secondary | ICD-10-CM

## 2012-08-07 HISTORY — PX: EUS: SHX5427

## 2012-08-07 SURGERY — UPPER ENDOSCOPIC ULTRASOUND (EUS) LINEAR
Anesthesia: Monitor Anesthesia Care

## 2012-08-07 MED ORDER — BUTAMBEN-TETRACAINE-BENZOCAINE 2-2-14 % EX AERO
INHALATION_SPRAY | CUTANEOUS | Status: DC | PRN
  Administered 2012-08-07: 2 via TOPICAL

## 2012-08-07 MED ORDER — SODIUM CHLORIDE 0.9 % IV SOLN: INTRAVENOUS | Status: DC

## 2012-08-07 MED ORDER — LACTATED RINGERS IV SOLN
INTRAVENOUS | Status: DC | PRN
  Administered 2012-08-07: 07:00:00 via INTRAVENOUS

## 2012-08-07 MED ORDER — PROPOFOL 10 MG/ML IV EMUL
INTRAVENOUS | Status: DC | PRN
  Administered 2012-08-07: 120 ug/kg/min via INTRAVENOUS

## 2012-08-07 NOTE — Anesthesia Postprocedure Evaluation (Signed)
Anesthesia Post Note  Patient: Jeffrey Trujillo  Procedure(s) Performed: Procedure(s) (LRB): UPPER ENDOSCOPIC ULTRASOUND (EUS) LINEAR (N/A)  Anesthesia type: MAC  Patient location: PACU  Post pain: Pain level controlled  Post assessment: Post-op Vital signs reviewed  Last Vitals: BP 170/93  Pulse 47  Temp 36.7 C (Oral)  Resp 20  Ht 5\' 10"  (1.778 m)  Wt 178 lb (80.74 kg)  BMI 25.54 kg/m2  SpO2 100%  Post vital signs: Reviewed  Level of consciousness: awake  Complications: No apparent anesthesia complications

## 2012-08-07 NOTE — Anesthesia Preprocedure Evaluation (Addendum)
Anesthesia Evaluation  Patient identified by MRN, date of birth, ID band Patient awake    Reviewed: Allergy & Precautions, H&P , NPO status , Patient's Chart, lab work & pertinent test results  Airway Mallampati: II TM Distance: >3 FB Neck ROM: Full    Dental  (+) Dental Advisory Given and Teeth Intact   Pulmonary neg pulmonary ROS,  breath sounds clear to auscultation  Pulmonary exam normal       Cardiovascular - CAD and - Past MI Rhythm:Regular Rate:Normal     Neuro/Psych negative neurological ROS  negative psych ROS   GI/Hepatic negative GI ROS, Neg liver ROS,   Endo/Other  negative endocrine ROS  Renal/GU negative Renal ROS     Musculoskeletal negative musculoskeletal ROS (+)   Abdominal   Peds  Hematology negative hematology ROS (+)   Anesthesia Other Findings   Reproductive/Obstetrics                          Anesthesia Physical Anesthesia Plan  ASA: II  Anesthesia Plan: MAC   Post-op Pain Management:    Induction: Intravenous  Airway Management Planned: Simple Face Mask  Additional Equipment:   Intra-op Plan:   Post-operative Plan:   Informed Consent: I have reviewed the patients History and Physical, chart, labs and discussed the procedure including the risks, benefits and alternatives for the proposed anesthesia with the patient or authorized representative who has indicated his/her understanding and acceptance.   Dental advisory given  Plan Discussed with: CRNA  Anesthesia Plan Comments:         Anesthesia Quick Evaluation

## 2012-08-07 NOTE — Op Note (Signed)
[facility] [address] [place], [address]   ENDOSCOPIC ULTRASOUND PROCEDURE REPORT  PATIENT: Jeffrey Trujillo, Jeffrey Trujillo  MR#: [id number] BIRTHDATE: 10/15/1950  GENDER: Male ENDOSCOPIST: Rachael Fee, MD REFERRED BY:  Heloise Purpura, M.D. PROCEDURE DATE:  08/07/2012 PROCEDURE:   Upper EUS w/FNA ASA CLASS:      Class II INDICATIONS:   1.  known recurrent renal cell carcinoma, new lesion in/near pancreatic uncinate. MEDICATIONS: MAC sedation, administered by CRNA DESCRIPTION OF PROCEDURE:   After the risks benefits and alternatives of the procedure were  explained, informed consent was obtained. The patient was then placed in the left, lateral, decubitus postion and IV sedation was administered. Throughout the procedure, the patients blood pressure, pulse and oxygen saturations were monitored continuously.  Under direct visualization, the Pentax Radial EUS [id number]  endoscope was introduced through the mouth  and advanced to the second portion of the duodenum .  Water was used as necessary to provide an acoustic interface.  Upon completion of the imaging, water was removed and the patient was sent to the recovery room in satisfactory condition.  Endoscopic findings: 1. Normal UGI tract  EUS findings: 1. 3.2cm, fairly round and well circumscribed hypoechoic and vascular mass in or directly abutting the pancreatic uncinate process. The mass does not directly abut any significant vascular structures.  The mass was sampled with 4 transduodenal passes of a 25 guage BS EUS FNA needle without suction. 2. Pancreatic parenchyma was otherwise normal. 3. Main pancreatic duct was normal, non-dilated 4. CBD was normal, non-dilated 5. Limited views of liver, spleen, portal and splenic vessels were all normal  Impression: 3.2cm lesion in or directly abutting the uncinate pancreas without any involvement of any signficant nearby blood vessels (PV, SMV, SMA not involved).   The mass is very vascular which is uncommon for pancreatic adenocarcinoma and common for renal cell lesions. Preliminary cytology review shows a few highly atypical cells. Awaiting final report.   ______________________________ eSigned:  Rachael Fee, MD 08/07/2012 9:30 AM   cc: Crecencio Mc, MD; Annett Gula, MD; Almond Lint, MD

## 2012-08-07 NOTE — H&P (View-Only) (Signed)
CC:   Jeffrey Comstock, MD Jeffrey Borden, MD Jeffrey Byerly, MD  REASON FOR CONSULTATION:  Kidney cancer.  HISTORY OF PRESENT ILLNESS:  This is a pleasant 60-year-old gentleman, currently of [place], [place], where he lived the majority of his life.  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  He is a relatively healthy gentleman without any really significant comorbid condition. He does have gout and treated occasionally for that.  His history of kidney cancer dates back to October 2010.  At that time, he had presented with a right kidney mass and underwent a radical nephrectomy on June 27, 2009. The pathological staging was T1b, was clear cell histology with Fuhrman grade 3/4.  Surgical margins were negative.  The patient had been followed since that time by Dr. Borden on an intermittent basis, and remained disease free until recently when was found to have a solid 2.8 cm mass in the central upper pole of the left kidney involving the renal sinus, suspicious for renal cell carcinoma.  An MRI of the abdomen was done on 07/18/2012, which showed a 4.5 x 2 7 x 3.6 irregular enhancing lesion of the left upper kidney.  No regional lymphadenopathy.  There is a 1.5 x 1.3 left adrenal lesion.  There is also a 2.6 x 3.0 x 3.9 cm enhancing lesion in the pancreatoduodenal groove. The patient at that time continued to be asymptomatic.  He was not reporting any specific symptoms related to this.  He has not reported any abdominal pain.  He had not reported any major complaints at this time.  The patient was referred for an endoscopic ultrasound, which is to be done this Thursday and he was also evaluated by Dr. Mattingly from nephrology in preparation for possible dialysis if indeed he elected with a nephrectomy.  He was also evaluated by Dr. Byerly, pancreatic surgery, for consideration of possible surgery if felt that would be needed.  As mentioned, Mr.  Trujillo continues to be asymptomatic at this time.  He had not reported any abdominal pain.  He had not reported any constipation. Had not reported any diarrhea.  REVIEW OF SYSTEMS:  Did not report any headaches, blurry vision, double vision.  He did not report any motor or sensory neuropathy.  He did not report any alteration in mental status. Did not report any psychiatric issues or depression.  He does not report any chest pain, orthopnea, PND, palpitation.  He did not report any cough or hemoptysis.  Did not report any nausea or vomiting.  He did not report any abdominal pain. Did not report any hematochezia or melena.  Rest of review of systems unremarkable.  PAST MEDICAL HISTORY:  Significant for history of anxiety, gout, history of hernia repair, history of a radical nephrectomy, and history of thoracotomy for a lung cyst.  MEDICATION:  Reviewed today.  He is on Topamax and indomethacin.  ALLERGIES:  Allergic to NSAIDs and Demerol.  SOCIAL HISTORY:  He is married.  He has 2 daughters.  She denies any alcohol abuse.  He drinks about 1 beer a day.  He denied any tobacco use.  FAMILY HISTORY:  He has 4 brothers, 3 of them had lung cancer, 1 had prostate cancer.  PHYSICAL EXAMINATION:  Alert, awake, pleasant gentleman, appeared in no active distress today.  His blood pressure is 138/78, pulse 72, respiration 20, weighs 182 pounds.  HEENT:  Head is normocephalic, atraumatic.  Pupils equal, round, reactive to   light.  Oral mucosa moist and pink.  Neck:  Supple without adenopathy.  Heart:  Regular rate and rhythm.  S1, S2.  Lungs:  Clear to auscultation, no rhonchi or wheeze. No dullness to percussion.  Abdomen:  Soft, nontender.  No hepatosplenomegaly.  Extremities:  No clubbing, cyanosis, or edema. Neurologically:  Intact motor, sensory and deep tendon reflexes.  LABORATORY DATA:  Hemoglobin of 12, white cell count 6.2, platelet count of 214.  Normal  differential.  ASSESSMENT AND PLAN:  A 60-year-old gentleman with the following issues: 1. A history of renal cell carcinoma.  He had stage T1b diagnosed in     October 2010 of the right kidney, status post right nephrectomy,     now presents with a solid tumor of the left kidney, confirmed by     imaging studies including an MRI, that is measuring 4.5 x 2.7 x 3.6     cm with associated left adrenal lesion.  The patient's case was     discussed in the genitourinary multidisciplinary board.  I     discussed these findings with Dr. Borden as well as Jeffrey Trujillo     today.  The treatment options at this time would include radical     surgery which Dr. Borden has explained in details,  risks and     benefits, including the rendering Jeffrey Trujillo dialysis dependent,     and certainly would indicate increase his chances of a prolonged     disease-free interval and definitely improve his overall survival.     But obviously the downside of that, the morbidity associated with     hemodialysis and again he met with Dr. Mattingly and discussed that     as well.  The alternative to that I discussed with him today would     be systemic therapy up front and use radical surgery to a later     date.  I have discussed with him the natural course of a treated     advanced kidney cancer with TKI or mTOR inhibitors and risks and     benefits of these drugs were discussed.  At that time, his     preference is to go with radical surgery if it is at all possible. 2. A pancreatic lesion.  At this time, the differential diagnosis     would include metastatic renal cell carcinoma, neuroendocrine tumor     of the pancreas versus adenocarcinoma of the pancreas versus a     benign tumor which I think is less likely.  I do agree that the 1st     step is to really identify whether this is indeed a metastatic     renal cell versus a new primary.  He is having an endoscopic     ultrasound and once that is completed, I  think that will help our     decision-making process.  If he has adenocarcinoma, I agree with     Dr. Byerly.  I do not think a debulking surgery would be warranted,     but if it is a metastatic lesion versus an endocrine or     neuroendocrine tumor of the pancreas, then surgery would be a     possibility  at that time. And, again I introduced with him the     concept of possibly using systemic therapy up front and reserving     surgery to a later date.  For the time,  I will   give my final     recommendation once the pathology of the pancreatic tumor is     available.  We will contact him after discussion with other     specialists.    ______________________________ Jendayi Berling N Meir Elwood, M.D. FNS/MEDQ  D:  08/05/2012  T:  08/06/2012  Job:  [id number] 

## 2012-08-07 NOTE — Interval H&P Note (Signed)
History and Physical Interval Note:  08/07/2012 7:20 AM  Jeffrey Trujillo  has presented today for surgery, with the diagnosis of Pancreatic mass [577.9] Renal cell carcinoma [189.0]  The various methods of treatment have been discussed with the patient and family. After consideration of risks, benefits and other options for treatment, the patient has consented to  Procedure(s) (LRB) with comments: UPPER ENDOSCOPIC ULTRASOUND (EUS) LINEAR (N/A) as a surgical intervention .  The patient's history has been reviewed, patient examined, no change in status, stable for surgery.  I have reviewed the patient's chart and labs.  Questions were answered to the patient's satisfaction.     Rob Bunting

## 2012-08-07 NOTE — Transfer of Care (Signed)
Immediate Anesthesia Transfer of Care Note  Patient: Jeffrey Trujillo  Procedure(s) Performed: Procedure(s) (LRB) with comments: UPPER ENDOSCOPIC ULTRASOUND (EUS) LINEAR (N/A)  Patient Location: PACU  Anesthesia Type:MAC  Level of Consciousness: oriented and sedated  Airway & Oxygen Therapy: Patient Spontanous Breathing  Post-op Assessment: Report given to PACU RN and Post -op Vital signs reviewed and stable  Post vital signs: Reviewed and stable  Complications: No apparent anesthesia complications

## 2012-08-08 ENCOUNTER — Encounter (HOSPITAL_COMMUNITY): Payer: Self-pay | Admitting: Gastroenterology

## 2012-08-19 ENCOUNTER — Telehealth: Payer: Self-pay

## 2012-08-19 ENCOUNTER — Other Ambulatory Visit: Payer: Self-pay

## 2012-08-19 ENCOUNTER — Encounter (HOSPITAL_COMMUNITY): Payer: Self-pay | Admitting: *Deleted

## 2012-08-19 DIAGNOSIS — K8689 Other specified diseases of pancreas: Secondary | ICD-10-CM

## 2012-08-19 NOTE — Telephone Encounter (Signed)
Pt has been scheduled for EUS 08/21/12

## 2012-08-19 NOTE — Pre-Procedure Instructions (Signed)
Your procedure is scheduled AV:[id number], August 21, 2012 Report to [facility] Admitting [id number] Call this number if you have problems morning of your procedure:[phone]  Follow all bowel prep instructions per your doctor's orders.  Do not eat or drink anything after midnight the night before your procedure. You may brush your teeth, rinse out your mouth, but no water, no food, no chewing gum, no mints, no candies, no chewing tobacco.     Take these medicines the morning of your procedure with A SIP OF WATER:None   Please make arrangements for a responsible person to drive you home after the procedure. You cannot go home by cab/taxi. We recommend you have someone with you at home the first 24 hours after your procedure. Driver for procedure is wife Alona Bene  LEAVE ALL VALUABLES, JEWELRY, BILLFOLD AT HOME.  NO DENTURES, CONTACT LENSES ALLOWED IN THE ENDOSCOPY ROOM.   YOU MAY WEAR DEODORANT, PLEASE REMOVE ALL JEWELRY, WATCHES RINGS, BODY PIERCINGS AND LEAVE AT HOME.   WOMEN: NO MAKE-UP, LOTIONS PERFUMES

## 2012-08-19 NOTE — Telephone Encounter (Signed)
Pt has been given the appt information and meds have been reviewed the pt is going to be calling Dr Angus Seller today to discuss the need for repeating the procedure

## 2012-08-20 ENCOUNTER — Encounter (HOSPITAL_COMMUNITY): Payer: Self-pay | Admitting: Pharmacy Technician

## 2012-08-21 ENCOUNTER — Encounter (HOSPITAL_COMMUNITY): Payer: Self-pay | Admitting: *Deleted

## 2012-08-21 ENCOUNTER — Ambulatory Visit (HOSPITAL_COMMUNITY): Payer: BC Managed Care – PPO | Admitting: Anesthesiology

## 2012-08-21 ENCOUNTER — Encounter (HOSPITAL_COMMUNITY)
Admission: RE | Disposition: A | Discharge status: Home / Self Care | Payer: Self-pay | Source: Ambulatory Visit | Attending: Gastroenterology

## 2012-08-21 ENCOUNTER — Ambulatory Visit (HOSPITAL_COMMUNITY)
Admission: RE | Admit: 2012-08-21 | Discharge: 2012-08-21 | Disposition: A | Discharge status: Home / Self Care | Payer: BC Managed Care – PPO | Source: Ambulatory Visit | Attending: Gastroenterology | Admitting: Gastroenterology

## 2012-08-21 ENCOUNTER — Encounter (HOSPITAL_COMMUNITY): Payer: Self-pay | Admitting: Anesthesiology

## 2012-08-21 DIAGNOSIS — C649 Malignant neoplasm of unspecified kidney, except renal pelvis: Secondary | ICD-10-CM | POA: Insufficient documentation

## 2012-08-21 DIAGNOSIS — Z801 Family history of malignant neoplasm of trachea, bronchus and lung: Secondary | ICD-10-CM | POA: Insufficient documentation

## 2012-08-21 DIAGNOSIS — K869 Disease of pancreas, unspecified: Secondary | ICD-10-CM

## 2012-08-21 DIAGNOSIS — M109 Gout, unspecified: Secondary | ICD-10-CM | POA: Insufficient documentation

## 2012-08-21 DIAGNOSIS — K8689 Other specified diseases of pancreas: Secondary | ICD-10-CM

## 2012-08-21 DIAGNOSIS — C250 Malignant neoplasm of head of pancreas: Secondary | ICD-10-CM | POA: Insufficient documentation

## 2012-08-21 DIAGNOSIS — Z905 Acquired absence of kidney: Secondary | ICD-10-CM | POA: Insufficient documentation

## 2012-08-21 DIAGNOSIS — Z79899 Other long term (current) drug therapy: Secondary | ICD-10-CM | POA: Insufficient documentation

## 2012-08-21 HISTORY — PX: EUS: SHX5427

## 2012-08-21 SURGERY — UPPER ENDOSCOPIC ULTRASOUND (EUS) LINEAR
Anesthesia: Monitor Anesthesia Care

## 2012-08-21 MED ORDER — LACTATED RINGERS IV SOLN
INTRAVENOUS | Status: DC
  Administered 2012-08-21 (×2): via INTRAVENOUS

## 2012-08-21 MED ORDER — FENTANYL CITRATE 0.05 MG/ML IJ SOLN
INTRAMUSCULAR | Status: DC | PRN
  Administered 2012-08-21 (×2): 50 ug via INTRAVENOUS

## 2012-08-21 MED ORDER — PROPOFOL 10 MG/ML IV EMUL
INTRAVENOUS | Status: DC | PRN
  Administered 2012-08-21: 140 ug/kg/min via INTRAVENOUS

## 2012-08-21 MED ORDER — SODIUM CHLORIDE 0.9 % IV SOLN: INTRAVENOUS | Status: DC

## 2012-08-21 MED ORDER — BUTAMBEN-TETRACAINE-BENZOCAINE 2-2-14 % EX AERO
INHALATION_SPRAY | CUTANEOUS | Status: DC | PRN
  Administered 2012-08-21: 2 via TOPICAL

## 2012-08-21 MED ORDER — FENTANYL CITRATE 0.05 MG/ML IJ SOLN: 25.0000 ug | INTRAMUSCULAR | Status: DC | PRN

## 2012-08-21 MED ORDER — MIDAZOLAM HCL 5 MG/5ML IJ SOLN
INTRAMUSCULAR | Status: DC | PRN
  Administered 2012-08-21 (×2): 1 mg via INTRAVENOUS

## 2012-08-21 MED ORDER — KETAMINE HCL 10 MG/ML IJ SOLN
INTRAMUSCULAR | Status: DC | PRN
  Administered 2012-08-21: 20 mg via INTRAVENOUS

## 2012-08-21 NOTE — Op Note (Signed)
[facility] [address] [place], [address]   ENDOSCOPIC ULTRASOUND PROCEDURE REPORT  PATIENT: Jeffrey Trujillo, Jeffrey Trujillo  MR#: [id number] BIRTHDATE: 1951-02-02  GENDER: Male ENDOSCOPIST: Rachael Fee, MD REFERRED BY:  Annett Gula, MD PROCEDURE DATE:  08/21/2012 PROCEDURE:   Upper EUS w/FNA ASA CLASS:      Class III INDICATIONS:   recurrent renal cell carcinoma; new lesion in pancreatic head; EUS FNA 2-3 weeks ago suggests carcinoma, need more tissue for definitive path diagnosis. MEDICATIONS: MAC sedation, administered by CRNA  DESCRIPTION OF PROCEDURE:   After the risks benefits and alternatives of the procedure were  explained, informed consent was obtained. The patient was then placed in the left, lateral, decubitus postion and IV sedation was administered. Throughout the procedure, the patients blood pressure, pulse and oxygen saturations were monitored continuously.  Under direct visualization, the Pentax EUS Linear [id number]  endoscope was introduced through the mouth  and advanced to the descending duodenum .  Water was used as necessary to provide an acoustic interface.  Upon completion of the imaging, water was removed and the patient was sent to the recovery room in satisfactory condition.   Endoscoipc findings: 1. Normal UGI tract  EUS findings: 1. 3.2cm round, hypoecohic, vasular mass in head of pancreas. The mass does not abut any signficant vascular structures and is completely confined within pancreatic parenchyma. The mass was sampled with 2 passes of a 22 gauge and 1 pass with a 25 gauge BS EUS FNA needle, transduodenal approach. 2. No peripancreatic adenopathy.  Impression: 3.2cm round, vascular mass in head pancreas. Repeat FNA performed, preliminary cytology shows "lesional tissue."  Awaiting final report.   _______________________________ eSigned:  Rachael Fee, MD 08/21/2012 1:55 PM

## 2012-08-21 NOTE — Transfer of Care (Signed)
Immediate Anesthesia Transfer of Care Note  Patient: Jeffrey Trujillo  Procedure(s) Performed: Procedure(s) (LRB) with comments: UPPER ENDOSCOPIC ULTRASOUND (EUS) LINEAR (N/A)  Patient Location: PACU  Anesthesia Type:MAC  Level of Consciousness: awake, alert  and oriented  Airway & Oxygen Therapy: Patient Spontanous Breathing and Patient connected to nasal cannula oxygen  Post-op Assessment: Report given to PACU RN and Post -op Vital signs reviewed and stable  Post vital signs: Reviewed and stable  Complications: No apparent anesthesia complications

## 2012-08-21 NOTE — Interval H&P Note (Signed)
History and Physical Interval Note:  08/21/2012 12:35 PM  Jeffrey Trujillo  has presented today for surgery, with the diagnosis of Pancreatic mass [577.9]  The various methods of treatment have been discussed with the patient and family. After consideration of risks, benefits and other options for treatment, the patient has consented to  Procedure(s) (LRB) with comments: UPPER ENDOSCOPIC ULTRASOUND (EUS) LINEAR (N/A) as a surgical intervention .  The patient's history has been reviewed, patient examined, no change in status, stable for surgery.  I have reviewed the patient's chart and labs.  Questions were answered to the patient's satisfaction.     Rob Bunting

## 2012-08-21 NOTE — H&P (View-Only) (Signed)
CC:   [name], MD Heloise Purpura, MD Almond Lint, MD  REASON FOR CONSULTATION:  Kidney cancer.  HISTORY OF PRESENT ILLNESS:  This is a pleasant 61 year old gentleman, currently of [place], [place], where he lived the majority of his life.  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  He is a relatively healthy gentleman without any really significant comorbid condition. He does have gout and treated occasionally for that.  His history of kidney cancer dates back to October 2010.  At that time, he had presented with a right kidney mass and underwent a radical nephrectomy on June 27, 2009. The pathological staging was T1b, was clear cell histology with Fuhrman grade 3/4.  Surgical margins were negative.  The patient had been followed since that time by Dr. Laverle Patter on an intermittent basis, and remained disease free until recently when was found to have a solid 2.8 cm mass in the central upper pole of the left kidney involving the renal sinus, suspicious for renal cell carcinoma.  An MRI of the abdomen was done on 07/18/2012, which showed a 4.5 x 2 7 x 3.6 irregular enhancing lesion of the left upper kidney.  No regional lymphadenopathy.  There is a 1.5 x 1.3 left adrenal lesion.  There is also a 2.6 x 3.0 x 3.9 cm enhancing lesion in the pancreatoduodenal groove. The patient at that time continued to be asymptomatic.  He was not reporting any specific symptoms related to this.  He has not reported any abdominal pain.  He had not reported any major complaints at this time.  The patient was referred for an endoscopic ultrasound, which is to be done this Thursday and he was also evaluated by Dr. Briant Cedar from nephrology in preparation for possible dialysis if indeed he elected with a nephrectomy.  He was also evaluated by Dr. Donell Beers, pancreatic surgery, for consideration of possible surgery if felt that would be needed.  As mentioned, Mr.  Soth continues to be asymptomatic at this time.  He had not reported any abdominal pain.  He had not reported any constipation. Had not reported any diarrhea.  REVIEW OF SYSTEMS:  Did not report any headaches, blurry vision, double vision.  He did not report any motor or sensory neuropathy.  He did not report any alteration in mental status. Did not report any psychiatric issues or depression.  He does not report any chest pain, orthopnea, PND, palpitation.  He did not report any cough or hemoptysis.  Did not report any nausea or vomiting.  He did not report any abdominal pain. Did not report any hematochezia or melena.  Rest of review of systems unremarkable.  PAST MEDICAL HISTORY:  Significant for history of anxiety, gout, history of hernia repair, history of a radical nephrectomy, and history of thoracotomy for a lung cyst.  MEDICATION:  Reviewed today.  He is on Topamax and indomethacin.  ALLERGIES:  Allergic to NSAIDs and Demerol.  SOCIAL HISTORY:  He is married.  He has 2 daughters.  She denies any alcohol abuse.  He drinks about 1 beer a day.  He denied any tobacco use.  FAMILY HISTORY:  He has 4 brothers, 3 of them had lung cancer, 1 had prostate cancer.  PHYSICAL EXAMINATION:  Alert, awake, pleasant gentleman, appeared in no active distress today.  His blood pressure is 138/78, pulse 72, respiration 20, weighs 182 pounds.  HEENT:  Head is normocephalic, atraumatic.  Pupils equal, round, reactive to  light.  Oral mucosa moist and pink.  Neck:  Supple without adenopathy.  Heart:  Regular rate and rhythm.  S1, S2.  Lungs:  Clear to auscultation, no rhonchi or wheeze. No dullness to percussion.  Abdomen:  Soft, nontender.  No hepatosplenomegaly.  Extremities:  No clubbing, cyanosis, or edema. Neurologically:  Intact motor, sensory and deep tendon reflexes.  LABORATORY DATA:  Hemoglobin of 12, white cell count 6.2, platelet count of 214.  Normal  differential.  ASSESSMENT AND PLAN:  A 61 year old gentleman with the following issues: 1. A history of renal cell carcinoma.  He had stage T1b diagnosed in     October 2010 of the right kidney, status post right nephrectomy,     now presents with a solid tumor of the left kidney, confirmed by     imaging studies including an MRI, that is measuring 4.5 x 2.7 x 3.6     cm with associated left adrenal lesion.  The patient's case was     discussed in the genitourinary multidisciplinary board.  I     discussed these findings with Dr. Laverle Patter as well as Mr. Demarest     today.  The treatment options at this time would include radical     surgery which Dr. Laverle Patter has explained in details,  risks and     benefits, including the rendering Mr. Oren dialysis dependent,     and certainly would indicate increase his chances of a prolonged     disease-free interval and definitely improve his overall survival.     But obviously the downside of that, the morbidity associated with     hemodialysis and again he met with Dr. Briant Cedar and discussed that     as well.  The alternative to that I discussed with him today would     be systemic therapy up front and use radical surgery to a later     date.  I have discussed with him the natural course of a treated     advanced kidney cancer with TKI or mTOR inhibitors and risks and     benefits of these drugs were discussed.  At that time, his     preference is to go with radical surgery if it is at all possible. 2. A pancreatic lesion.  At this time, the differential diagnosis     would include metastatic renal cell carcinoma, neuroendocrine tumor     of the pancreas versus adenocarcinoma of the pancreas versus a     benign tumor which I think is less likely.  I do agree that the 1st     step is to really identify whether this is indeed a metastatic     renal cell versus a new primary.  He is having an endoscopic     ultrasound and once that is completed, I  think that will help our     decision-making process.  If he has adenocarcinoma, I agree with     Dr. Donell Beers.  I do not think a debulking surgery would be warranted,     but if it is a metastatic lesion versus an endocrine or     neuroendocrine tumor of the pancreas, then surgery would be a     possibility  at that time. And, again I introduced with him the     concept of possibly using systemic therapy up front and reserving     surgery to a later date.  For the time,  I will  give my final     recommendation once the pathology of the pancreatic tumor is     available.  We will contact him after discussion with other     specialists.    ______________________________ Benjiman Core, M.D. FNS/MEDQ  D:  08/05/2012  T:  08/06/2012  Job:  [id number]

## 2012-08-21 NOTE — Anesthesia Preprocedure Evaluation (Addendum)
Anesthesia Evaluation  Patient identified by MRN, date of birth, ID band Patient awake    Reviewed: Allergy & Precautions, H&P , NPO status , Patient's Chart, lab work & pertinent test results  Airway Mallampati: II TM Distance: >3 FB Neck ROM: full    Dental No notable dental hx. (+) Teeth Intact and Dental Advisory Given   Pulmonary neg pulmonary ROS,  breath sounds clear to auscultation  Pulmonary exam normal       Cardiovascular Exercise Tolerance: Good negative cardio ROS  Rhythm:regular Rate:Normal     Neuro/Psych negative neurological ROS  negative psych ROS   GI/Hepatic Neg liver ROS, Pancreatic mass   Endo/Other  negative endocrine ROS  Renal/GU negative Renal ROS  negative genitourinary   Musculoskeletal   Abdominal   Peds  Hematology negative hematology ROS (+)   Anesthesia Other Findings   Reproductive/Obstetrics negative OB ROS                           Anesthesia Physical Anesthesia Plan  ASA: II  Anesthesia Plan: MAC   Post-op Pain Management:    Induction:   Airway Management Planned:   Additional Equipment:   Intra-op Plan:   Post-operative Plan:   Informed Consent: I have reviewed the patients History and Physical, chart, labs and discussed the procedure including the risks, benefits and alternatives for the proposed anesthesia with the patient or authorized representative who has indicated his/her understanding and acceptance.   Dental Advisory Given  Plan Discussed with: CRNA and Surgeon  Anesthesia Plan Comments:         Anesthesia Quick Evaluation

## 2012-08-21 NOTE — Preoperative (Signed)
Beta Blockers   Reason not to administer Beta Blockers:Not Applicable 

## 2012-08-21 NOTE — Anesthesia Postprocedure Evaluation (Signed)
  Anesthesia Post-op Note  Patient: Jeffrey Trujillo  Procedure(s) Performed: Procedure(s) (LRB): UPPER ENDOSCOPIC ULTRASOUND (EUS) LINEAR (N/A)  Patient Location: PACU  Anesthesia Type: MAC  Level of Consciousness: awake and alert   Airway and Oxygen Therapy: Patient Spontanous Breathing  Post-op Pain: mild  Post-op Assessment: Post-op Vital signs reviewed, Patient's Cardiovascular Status Stable, Respiratory Function Stable, Patent Airway and No signs of Nausea or vomiting  Last Vitals:  Filed Vitals:   08/21/12 1345  BP: 138/76  Temp: 36.6 C  Resp: 16    Post-op Vital Signs: stable   Complications: No apparent anesthesia complications

## 2012-08-22 ENCOUNTER — Encounter (HOSPITAL_COMMUNITY): Payer: Self-pay | Admitting: Gastroenterology

## 2012-09-03 ENCOUNTER — Other Ambulatory Visit: Payer: Self-pay | Admitting: Oncology

## 2012-09-04 ENCOUNTER — Telehealth: Payer: Self-pay | Admitting: Oncology

## 2012-09-04 NOTE — Telephone Encounter (Signed)
LMONVM ADVIISNG THE PT OF HIS APPT WITH DR Clelia Croft ON 09/05/2012@4 :00PM

## 2012-09-05 ENCOUNTER — Telehealth: Payer: Self-pay | Admitting: Oncology

## 2012-09-05 ENCOUNTER — Ambulatory Visit (HOSPITAL_BASED_OUTPATIENT_CLINIC_OR_DEPARTMENT_OTHER): Payer: BC Managed Care – PPO | Admitting: Oncology

## 2012-09-05 VITALS — BP 157/85 | HR 56 | Temp 97.0°F | Resp 18 | Ht 70.0 in | Wt 183.9 lb

## 2012-09-05 DIAGNOSIS — C649 Malignant neoplasm of unspecified kidney, except renal pelvis: Secondary | ICD-10-CM

## 2012-09-05 DIAGNOSIS — N289 Disorder of kidney and ureter, unspecified: Secondary | ICD-10-CM

## 2012-09-05 DIAGNOSIS — K869 Disease of pancreas, unspecified: Secondary | ICD-10-CM

## 2012-09-05 MED ORDER — PAZOPANIB HCL 200 MG PO TABS: 800.0000 mg | ORAL_TABLET | Freq: Every day | ORAL | Status: DC

## 2012-09-05 NOTE — Progress Notes (Signed)
Hematology and Oncology Follow Up Visit  JARVIS SAWA [id number] 03-26-1951 61 y.o. 09/05/2012 3:59 PM [name], MDComstock, Sharon Seller, MD   Principle Diagnosis: 61 year old with the following issues: 1. T1b right kidney cancer diagnosed in 2010. He is rdical nephrectomy on June 27, 2009. The pathological staging was T1b, was clear cell histology with Fuhrman grade 3/4.  2. Left Kidney mass  Measuring 4.5 x 2 7 x 3.6 irregular enhancing appears to be a new kidney cancer noted in 07/2012. 3. Pancreatic lesion. 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  Current therapy: Under consideration for surgery vs. Systemic therapy.   Interim History: Mr. Netter presents for a follow up visit. He is a nice man with the above issues. Since his last visit, He under went EUS and pancreatic biposy. These results discussed with Dr. Dierdre Searles and Dr. Laverle Patter and again with Mr. Bossard today. He continues to feel well without complications. He had not reported any abdominal pain. He had not reported any constipation. Had not reported any diarrhea.   Medications: I have reviewed the patient's current medications. Current outpatient prescriptions:indomethacin (INDOCIN) 50 MG capsule, Take 50 mg by mouth as needed. For gout flares, Disp: , Rfl: ;  topiramate (TOPAMAX) 25 MG tablet, Take 25 mg by mouth 2 (two) times daily., Disp: , Rfl: ;  pazopanib (VOTRIENT) 200 MG tablet, Take 4 tablets (800 mg total) by mouth daily. Take on an empty stomach., Disp: 120 tablet, Rfl: 0  Allergies:  Allergies  Allergen Reactions  . Nsaids Other (See Comments)    Patient only has one kidney.   . Meperidine Hcl Hives and Rash    Past Medical History, Surgical history, Social history, and Family History were reviewed and updated.  Review of Systems: Constitutional:  Negative for fever, chills, night sweats, anorexia, weight loss, pain. Cardiovascular: no chest pain or dyspnea on exertion Respiratory:  negative Neurological: negative Dermatological: negative ENT: negative Skin: Negative. Gastrointestinal: negative Genito-Urinary: negative Hematological and Lymphatic: negative Breast: negative Musculoskeletal: negative Remaining ROS negative. Physical Exam: Blood pressure 157/85, pulse 56, temperature 97 F (36.1 C), temperature source Oral, resp. rate 18, height 5\' 10"  (1.778 m), weight 183 lb 14.4 oz (83.416 kg). ECOG: 1 General appearance: alert Head: Normocephalic, without obvious abnormality, atraumatic Neck: no adenopathy, no carotid bruit, no JVD, supple, symmetrical, trachea midline and thyroid not enlarged, symmetric, no tenderness/mass/nodules Lymph nodes: Cervical, supraclavicular, and axillary nodes normal. Heart:regular rate and rhythm, S1, S2 normal, no murmur, click, rub or gallop Lung:chest clear, no wheezing, rales, normal symmetric air entry Abdomin: soft, non-tender, without masses or organomegaly EXT:no erythema, induration, or nodules   Lab Results: Lab Results  Component Value Date   WBC 6.2 08/05/2012   HGB 12.9* 08/05/2012   HCT 36.5* 08/05/2012   MCV 105.4* 08/05/2012   PLT 214 08/05/2012     Chemistry      Component Value Date/Time   NA 141 08/05/2012 1358   NA 132* 03/12/2012 2110   K 4.2 08/05/2012 1358   K 3.8 03/12/2012 2110   CL 107 08/05/2012 1358   CL 97 03/12/2012 2110   CO2 27 08/05/2012 1358   CO2 23 03/12/2012 2110   BUN 18.0 08/05/2012 1358   BUN 22 03/12/2012 2110   CREATININE 1.4* 08/05/2012 1358   CREATININE 1.37* 07/18/2012 1145      Component Value Date/Time   CALCIUM 9.4 08/05/2012 1358   CALCIUM 9.5 03/12/2012 2110   ALKPHOS 118 08/05/2012  1358   ALKPHOS 94 10/03/2009 1632   AST 69* 08/05/2012 1358   AST 31 10/03/2009 1632   ALT 81* 08/05/2012 1358   ALT 35 10/03/2009 1632   BILITOT 0.85 08/05/2012 1358   BILITOT 1.7* 10/03/2009 1632         Impression and Plan:   A 61 year old gentleman with the following issues:   1. A history of renal cell carcinoma. He had stage T1b diagnosed in October 2010 of the right kidney, status post right nephrectomy, now presents with a solid tumor of the left kidney.   2. Pancreatic lesion. Biopsy proven to be malignant and likely represent renal cell cancer. Treatment options discussed today in details. I discussed the option of surgery which will be extensive and will leave him on HD dependant. At this time, I feel that this approach will not be curative and will leave him with a lot of morbidity.  The alternative is use systemic therapy. I discussed the risks and benefits of Votrient. Complications include hypertensin, GI problems, cardiac and liver complications. I feel it is better to start with systemic therapy and if he have stable disease, then one can consider surgical options.   He is agreeable to proceed with Votrient, written information given to him today.  I offered him a second opinion but he declined.   He will follow up in few weeks after he starts the medication.    Eli Hose, MD 12/20/20133:59 PM

## 2012-09-05 NOTE — Telephone Encounter (Signed)
appts made and printed for pt aom °

## 2012-09-05 NOTE — Progress Notes (Signed)
Rx for Pazopanib (Votrient) given to [facility] for prior approval

## 2012-09-25 ENCOUNTER — Inpatient Hospital Stay (HOSPITAL_COMMUNITY)
Admission: EM | Admit: 2012-09-25 | Discharge: 2012-09-30 | DRG: 174 | Disposition: A | Discharge status: Home / Self Care | Payer: BC Managed Care – PPO | Attending: Internal Medicine | Admitting: Internal Medicine

## 2012-09-25 ENCOUNTER — Encounter (HOSPITAL_COMMUNITY)
Admission: EM | Disposition: A | Discharge status: Home / Self Care | Payer: Self-pay | Source: Home / Self Care | Attending: Internal Medicine

## 2012-09-25 ENCOUNTER — Encounter (HOSPITAL_COMMUNITY): Payer: Self-pay

## 2012-09-25 ENCOUNTER — Telehealth: Payer: Self-pay | Admitting: *Deleted

## 2012-09-25 DIAGNOSIS — Z905 Acquired absence of kidney: Secondary | ICD-10-CM

## 2012-09-25 DIAGNOSIS — D500 Iron deficiency anemia secondary to blood loss (chronic): Secondary | ICD-10-CM

## 2012-09-25 DIAGNOSIS — C7889 Secondary malignant neoplasm of other digestive organs: Secondary | ICD-10-CM | POA: Diagnosis present

## 2012-09-25 DIAGNOSIS — K5731 Diverticulosis of large intestine without perforation or abscess with bleeding: Principal | ICD-10-CM | POA: Diagnosis present

## 2012-09-25 DIAGNOSIS — K921 Melena: Secondary | ICD-10-CM

## 2012-09-25 DIAGNOSIS — S161XXA Strain of muscle, fascia and tendon at neck level, initial encounter: Secondary | ICD-10-CM

## 2012-09-25 DIAGNOSIS — Z79899 Other long term (current) drug therapy: Secondary | ICD-10-CM

## 2012-09-25 DIAGNOSIS — M1990 Unspecified osteoarthritis, unspecified site: Secondary | ICD-10-CM | POA: Diagnosis present

## 2012-09-25 DIAGNOSIS — K2270 Barrett's esophagus without dysplasia: Secondary | ICD-10-CM

## 2012-09-25 DIAGNOSIS — K2960 Other gastritis without bleeding: Secondary | ICD-10-CM | POA: Diagnosis present

## 2012-09-25 DIAGNOSIS — M109 Gout, unspecified: Secondary | ICD-10-CM | POA: Diagnosis present

## 2012-09-25 DIAGNOSIS — K5730 Diverticulosis of large intestine without perforation or abscess without bleeding: Secondary | ICD-10-CM

## 2012-09-25 DIAGNOSIS — K922 Gastrointestinal hemorrhage, unspecified: Secondary | ICD-10-CM

## 2012-09-25 DIAGNOSIS — K625 Hemorrhage of anus and rectum: Secondary | ICD-10-CM

## 2012-09-25 DIAGNOSIS — K2970 Gastritis, unspecified, without bleeding: Secondary | ICD-10-CM

## 2012-09-25 DIAGNOSIS — K2990 Gastroduodenitis, unspecified, without bleeding: Secondary | ICD-10-CM

## 2012-09-25 DIAGNOSIS — R197 Diarrhea, unspecified: Secondary | ICD-10-CM

## 2012-09-25 DIAGNOSIS — R112 Nausea with vomiting, unspecified: Secondary | ICD-10-CM

## 2012-09-25 DIAGNOSIS — K8689 Other specified diseases of pancreas: Secondary | ICD-10-CM

## 2012-09-25 DIAGNOSIS — C801 Malignant (primary) neoplasm, unspecified: Secondary | ICD-10-CM

## 2012-09-25 DIAGNOSIS — C649 Malignant neoplasm of unspecified kidney, except renal pelvis: Secondary | ICD-10-CM | POA: Diagnosis present

## 2012-09-25 DIAGNOSIS — M5030 Other cervical disc degeneration, unspecified cervical region: Secondary | ICD-10-CM | POA: Diagnosis present

## 2012-09-25 DIAGNOSIS — M47812 Spondylosis without myelopathy or radiculopathy, cervical region: Secondary | ICD-10-CM | POA: Diagnosis present

## 2012-09-25 HISTORY — DX: Diverticulosis of large intestine without perforation or abscess without bleeding: K57.30

## 2012-09-25 HISTORY — DX: Spondylosis without myelopathy or radiculopathy, cervical region: M47.812

## 2012-09-25 HISTORY — PX: COLONOSCOPY: SHX5424

## 2012-09-25 HISTORY — PX: ESOPHAGOGASTRODUODENOSCOPY: SHX5428

## 2012-09-25 HISTORY — DX: Barrett's esophagus without dysplasia: K22.70

## 2012-09-25 LAB — CBC
HCT: 21.8 % — ABNORMAL LOW (ref 39.0–52.0)
Hemoglobin: 7.8 g/dL — ABNORMAL LOW (ref 13.0–17.0)
RBC: 2.22 MIL/uL — ABNORMAL LOW (ref 4.22–5.81)

## 2012-09-25 LAB — APTT: aPTT: 34 seconds (ref 24–37)

## 2012-09-25 LAB — CBC WITH DIFFERENTIAL/PLATELET
Basophils Relative: 1 % (ref 0–1)
Eosinophils Absolute: 0.1 10*3/uL (ref 0.0–0.7)
Eosinophils Relative: 2 % (ref 0–5)
HCT: 28.7 % — ABNORMAL LOW (ref 39.0–52.0)
Hemoglobin: 9.9 g/dL — ABNORMAL LOW (ref 13.0–17.0)
MCH: 34 pg (ref 26.0–34.0)
MCHC: 34.5 g/dL (ref 30.0–36.0)
MCV: 98.6 fL (ref 78.0–100.0)
Monocytes Absolute: 0.6 10*3/uL (ref 0.1–1.0)
Monocytes Relative: 10 % (ref 3–12)
Neutro Abs: 4.4 10*3/uL (ref 1.7–7.7)

## 2012-09-25 LAB — ABO/RH: ABO/RH(D): B NEG

## 2012-09-25 LAB — COMPREHENSIVE METABOLIC PANEL
Albumin: 3.3 g/dL — ABNORMAL LOW (ref 3.5–5.2)
BUN: 23 mg/dL (ref 6–23)
Calcium: 9.5 mg/dL (ref 8.4–10.5)
Chloride: 104 mEq/L (ref 96–112)
Creatinine, Ser: 1.47 mg/dL — ABNORMAL HIGH (ref 0.50–1.35)
Total Bilirubin: 0.5 mg/dL (ref 0.3–1.2)

## 2012-09-25 LAB — TSH: TSH: 1.891 u[IU]/mL (ref 0.350–4.500)

## 2012-09-25 LAB — URINALYSIS, ROUTINE W REFLEX MICROSCOPIC
Bilirubin Urine: NEGATIVE
Glucose, UA: NEGATIVE mg/dL
Leukocytes, UA: NEGATIVE
Nitrite: NEGATIVE
Specific Gravity, Urine: 1.01 (ref 1.005–1.030)
pH: 5.5 (ref 5.0–8.0)

## 2012-09-25 LAB — MAGNESIUM: Magnesium: 1.9 mg/dL (ref 1.5–2.5)

## 2012-09-25 SURGERY — COLONOSCOPY
Anesthesia: Moderate Sedation

## 2012-09-25 MED ORDER — MIDAZOLAM HCL 5 MG/5ML IJ SOLN
INTRAMUSCULAR | Status: AC
  Filled 2012-09-25: qty 10

## 2012-09-25 MED ORDER — FENTANYL CITRATE 0.05 MG/ML IJ SOLN
INTRAMUSCULAR | Status: AC
Start: 2012-09-25 — End: 2012-09-25
  Filled 2012-09-25: qty 4

## 2012-09-25 MED ORDER — TOPIRAMATE 25 MG PO TABS
25.0000 mg | ORAL_TABLET | Freq: Two times a day (BID) | ORAL | Status: DC
  Administered 2012-09-25 – 2012-09-30 (×11): 25 mg via ORAL
  Filled 2012-09-25 (×14): qty 1

## 2012-09-25 MED ORDER — STERILE WATER FOR IRRIGATION IR SOLN
Status: DC | PRN
  Administered 2012-09-25: 14:00:00

## 2012-09-25 MED ORDER — PROMETHAZINE HCL 25 MG/ML IJ SOLN
25.0000 mg | INTRAMUSCULAR | Status: AC
  Administered 2012-09-25: 25 mg via INTRAVENOUS

## 2012-09-25 MED ORDER — FENTANYL CITRATE 0.05 MG/ML IJ SOLN
INTRAMUSCULAR | Status: DC | PRN
  Administered 2012-09-25 (×3): 25 ug via INTRAVENOUS

## 2012-09-25 MED ORDER — SODIUM CHLORIDE 0.9 % IV SOLN
INTRAVENOUS | Status: DC
  Administered 2012-09-25: 14:00:00 via INTRAVENOUS

## 2012-09-25 MED ORDER — POTASSIUM CHLORIDE IN NACL 20-0.9 MEQ/L-% IV SOLN
INTRAVENOUS | Status: DC
  Administered 2012-09-25: 11:00:00 via INTRAVENOUS

## 2012-09-25 MED ORDER — PROMETHAZINE HCL 25 MG/ML IJ SOLN
INTRAMUSCULAR | Status: AC
  Filled 2012-09-25: qty 1

## 2012-09-25 MED ORDER — MORPHINE SULFATE 4 MG/ML IJ SOLN: 2.0000 mg | Freq: Once | INTRAMUSCULAR | Status: DC

## 2012-09-25 MED ORDER — ONDANSETRON HCL 4 MG/2ML IJ SOLN
4.0000 mg | Freq: Once | INTRAMUSCULAR | Status: AC
  Administered 2012-09-25: 4 mg via INTRAVENOUS
  Filled 2012-09-25: qty 2

## 2012-09-25 MED ORDER — PANTOPRAZOLE SODIUM 40 MG IV SOLR
40.0000 mg | Freq: Once | INTRAVENOUS | Status: AC
  Administered 2012-09-25: 40 mg via INTRAVENOUS

## 2012-09-25 MED ORDER — MORPHINE SULFATE 2 MG/ML IJ SOLN
2.0000 mg | Freq: Once | INTRAMUSCULAR | Status: AC
  Administered 2012-09-25: 2 mg via INTRAVENOUS
  Filled 2012-09-25: qty 1

## 2012-09-25 MED ORDER — BUTAMBEN-TETRACAINE-BENZOCAINE 2-2-14 % EX AERO
INHALATION_SPRAY | CUTANEOUS | Status: DC | PRN
  Administered 2012-09-25: 2 via TOPICAL

## 2012-09-25 MED ORDER — MEPERIDINE HCL 100 MG/ML IJ SOLN
INTRAMUSCULAR | Status: AC
  Filled 2012-09-25: qty 2

## 2012-09-25 MED ORDER — PANTOPRAZOLE SODIUM 40 MG IV SOLR
40.0000 mg | Freq: Two times a day (BID) | INTRAVENOUS | Status: DC
  Administered 2012-09-25 – 2012-09-26 (×4): 40 mg via INTRAVENOUS
  Filled 2012-09-25 (×4): qty 40

## 2012-09-25 MED ORDER — SODIUM CHLORIDE 0.9 % IV SOLN
Freq: Once | INTRAVENOUS | Status: AC
  Administered 2012-09-25: 150 mL/h via INTRAVENOUS

## 2012-09-25 MED ORDER — SODIUM CHLORIDE 0.9 % IJ SOLN
INTRAMUSCULAR | Status: AC
  Filled 2012-09-25: qty 10

## 2012-09-25 MED ORDER — PEG 3350-KCL-NA BICARB-NACL 420 G PO SOLR
4000.0000 mL | Freq: Once | ORAL | Status: AC
  Administered 2012-09-25: 4000 mL via ORAL
  Filled 2012-09-25: qty 4000

## 2012-09-25 MED ORDER — HYDROMORPHONE HCL PF 1 MG/ML IJ SOLN
0.5000 mg | INTRAMUSCULAR | Status: DC | PRN
  Administered 2012-09-26 – 2012-09-29 (×13): 0.5 mg via INTRAVENOUS
  Filled 2012-09-25 (×14): qty 1

## 2012-09-25 MED ORDER — MIDAZOLAM HCL 5 MG/5ML IJ SOLN
INTRAMUSCULAR | Status: DC | PRN
  Administered 2012-09-25 (×4): 1 mg via INTRAVENOUS

## 2012-09-25 MED ORDER — ONDANSETRON HCL 4 MG/2ML IJ SOLN
4.0000 mg | INTRAMUSCULAR | Status: DC | PRN
  Administered 2012-09-25 – 2012-09-26 (×2): 4 mg via INTRAVENOUS
  Filled 2012-09-25 (×2): qty 2

## 2012-09-25 MED ORDER — SODIUM CHLORIDE 0.9 % IJ SOLN
3.0000 mL | Freq: Two times a day (BID) | INTRAMUSCULAR | Status: DC
  Administered 2012-09-25 – 2012-09-29 (×9): 3 mL via INTRAVENOUS
  Filled 2012-09-25: qty 3

## 2012-09-25 NOTE — Progress Notes (Signed)
Utilization Review Complete  

## 2012-09-25 NOTE — ED Notes (Signed)
Dr Campbell at bedside.

## 2012-09-25 NOTE — ED Provider Notes (Addendum)
History     CSN: [id number]  Arrival date & time 09/25/12  0308   First MD Initiated Contact with Patient 09/25/12 [phone]      Chief Complaint  Patient presents with  . Rectal Bleeding    (Consider location/radiation/quality/duration/timing/severity/associated sxs/prior treatment) HPI  Jeffrey Trujillo is a 62 y.o. male with a h/o renal cell carcinoma with recurrence, recently started on chemotherapy (09/22/12)  who presents to the Emergency Department complaining of rectal bleeding that began this morning. He had taken indomethacin for a gout flare yesterday. He has not had 5 bowel movements that are bright red blood. He has had a radical nephrectomy on the right and currently has both a left kidney mass( 07/2012)and biopsy proven  pancreatic lesion (08/2012).Bleeding was not accompanied by nausea, vomiting. He had some mild abdominal cramping with the first bowel movement.   Patient last ate a banana sandwich at 1 AM.  PCP Dr. Jorene Guest Oncology [name]   Past Medical History  Diagnosis Date  . Gout   . Cancer     "was told has cancer on other kidney and spot on pancreas"    Past Surgical History  Procedure Date  . Nephrectomy     right due to cancer  . Lung removal, partial   . Eus 08/07/2012    Procedure: UPPER ENDOSCOPIC ULTRASOUND (EUS) LINEAR;  Surgeon: Rachael Fee, MD;  Location: [facility];  Service: Endoscopy;  Laterality: N/A;  . Inguinal hernia repair   . Hernia repair   . Inguinal hernia repair     Right  . Eus 08/21/2012    Procedure: UPPER ENDOSCOPIC ULTRASOUND (EUS) LINEAR;  Surgeon: Rachael Fee, MD;  Location: [facility];  Service: Endoscopy;  Laterality: N/A;    Family History  Problem Relation Age of Onset  . Lung cancer Brother   . Prostate cancer Brother     History  Substance Use Topics  . Smoking status: Never Smoker   . Smokeless tobacco: Never Used  . Alcohol Use: Yes     Comment: 2/5 th liquor usually weekly      Review  of Systems  Constitutional: Negative for fever.       10 Systems reviewed and are negative for acute change except as noted in the HPI.  HENT: Negative for congestion.   Eyes: Negative for discharge and redness.  Respiratory: Negative for cough and shortness of breath.   Cardiovascular: Negative for chest pain.  Gastrointestinal: Positive for blood in stool. Negative for vomiting and abdominal pain.  Musculoskeletal: Negative for back pain.       Hand pain  Skin: Negative for rash.  Neurological: Negative for syncope, numbness and headaches.  Psychiatric/Behavioral:       No behavior change.    Allergies  Nsaids and Meperidine hcl  Home Medications   Current Outpatient Rx  Name  Route  Sig  Dispense  Refill  . INDOMETHACIN 50 MG PO CAPS   Oral   Take 50 mg by mouth as needed. For gout flares         . PAZOPANIB HCL 200 MG PO TABS   Oral   Take 4 tablets (800 mg total) by mouth daily. Take on an empty stomach.   120 tablet   0   . TOPIRAMATE 25 MG PO TABS   Oral   Take 25 mg by mouth 2 (two) times daily.           BP 101/67  Pulse  75  Temp 98 F (36.7 C) (Oral)  Resp 18  Ht 5\' 11"  (1.803 m)  Wt 183 lb (83.008 kg)  BMI 25.52 kg/m2  SpO2 100%  Physical Exam  Nursing note and vitals reviewed. Constitutional:       Awake, alert, nontoxic appearance.  HENT:  Head: Atraumatic.  Right Ear: External ear normal.  Left Ear: External ear normal.  Eyes: Right eye exhibits no discharge. Left eye exhibits no discharge.  Neck: Neck supple.  Cardiovascular: Normal heart sounds.   Pulmonary/Chest: Effort normal and breath sounds normal. He exhibits no tenderness.  Abdominal: Soft. Bowel sounds are normal. There is no tenderness. There is no rebound.       Rectal bleeding, BRBPR, guaiac positive  Musculoskeletal: He exhibits no tenderness.       Baseline ROM, no obvious new focal weakness. Right hand with swelling/ tenderness to thumb and fingers.   Neurological:        Mental status and motor strength appears baseline for patient and situation.  Skin: No rash noted.  Psychiatric: He has a normal mood and affect.    ED Course  Procedures (including critical care time) Results for orders placed during the hospital encounter of 09/25/12  CBC WITH DIFFERENTIAL      Component Value Range   WBC 5.7  4.0 - 10.5 K/uL   RBC 2.91 (*) 4.22 - 5.81 MIL/uL   Hemoglobin 9.9 (*) 13.0 - 17.0 g/dL   HCT 91.4 (*) 78.2 - 95.6 %   MCV 98.6  78.0 - 100.0 fL   MCH 34.0  26.0 - 34.0 pg   MCHC 34.5  30.0 - 36.0 g/dL   RDW 21.3  08.6 - 57.8 %   Platelets 225  150 - 400 K/uL   Neutrophils Relative 78 (*) 43 - 77 %   Neutro Abs 4.4  1.7 - 7.7 K/uL   Lymphocytes Relative 10 (*) 12 - 46 %   Lymphs Abs 0.5 (*) 0.7 - 4.0 K/uL   Monocytes Relative 10  3 - 12 %   Monocytes Absolute 0.6  0.1 - 1.0 K/uL   Eosinophils Relative 2  0 - 5 %   Eosinophils Absolute 0.1  0.0 - 0.7 K/uL   Basophils Relative 1  0 - 1 %   Basophils Absolute 0.1  0.0 - 0.1 K/uL  COMPREHENSIVE METABOLIC PANEL      Component Value Range   Sodium 140  135 - 145 mEq/L   Potassium 3.5  3.5 - 5.1 mEq/L   Chloride 104  96 - 112 mEq/L   CO2 26  19 - 32 mEq/L   Glucose, Bld 120 (*) 70 - 99 mg/dL   BUN 23  6 - 23 mg/dL   Creatinine, Ser 4.69 (*) 0.50 - 1.35 mg/dL   Calcium 9.5  8.4 - 62.9 mg/dL   Total Protein 7.4  6.0 - 8.3 g/dL   Albumin 3.3 (*) 3.5 - 5.2 g/dL   AST 69 (*) 0 - 37 U/L   ALT 95 (*) 0 - 53 U/L   Alkaline Phosphatase 95  39 - 117 U/L   Total Bilirubin 0.5  0.3 - 1.2 mg/dL   GFR calc non Af Amer 50 (*) >90 mL/min   GFR calc Af Amer 58 (*) >90 mL/min  TYPE AND SCREEN      Component Value Range   ABO/RH(D) B NEG     Antibody Screen NEG     Sample Expiration 09/28/2012  Date: 09/25/2012  0604  Rate: 59  Rhythm: sinus bradycardia and with sinur arrhythmia  QRS Axis: normal  Intervals: normal  ST/T Wave abnormalities: normal  Conduction Disutrbances:none  Narrative  Interpretation: septal infarct, , age undetermined  Old EKG Reviewed: changes noted c/w 06/21/2009   5:38 AM:  T/C to Dr. Orvan Falconer, hospitalist, case discussed, including:  HPI, pertinent PM/SHx, VS/PE, dx testing, ED course and treatment.  Agreeable to admission.  Requests to write temporary orders, telemetry bed. 5:51 AM:  T/C to Dr. Darrick Penna, GI, case discussed, including:  HPI, pertinent PM/SHx, VS/PE, dx testing, ED course and treatment.  She will see patient in hospital.  MDM  Patient with h/o renal cell carcinoma with recurrence here with rectal bleeding after taking an indomethacin for a gout flare. BM x 5 with BRBPR. Associated with mild abdominal cramping and feeling of weakness. Labs with Hgb 9.9 down from 12.9 (07/2012). Type and screen completed. Spoke with Dr. Casilda Carls, who will admit the patient. Spoke with Dr. Darrick Penna, GI, who will see patient in hospital. Pt stable in ED with no significant deterioration in condition.The patient appears reasonably stabilized for admission considering the current resources, flow, and capabilities available in the ED at this time, and I doubt any other [facility] requiring further screening and/or treatment in the ED prior to admission.  MDM Reviewed: nursing note, vitals and previous chart Reviewed previous: labs Interpretation: labs Consults: gastrointestinal  hospitalist CRITICAL CARE Performed by: Annamarie Dawley. Total critical care time: 50 Critical care time was exclusive of separately billable procedures and treating other patients. Critical care was necessary to treat or prevent imminent or life-threatening deterioration. Critical care was time spent personally by me on the following activities: development of treatment plan with patient and/or surrogate as well as nursing, discussions with consultants, evaluation of patient's response to treatment, examination of patient, obtaining history from patient or surrogate, ordering and  performing treatments and interventions, ordering and review of laboratory studies, ordering and review of radiographic studies, pulse oximetry and re-evaluation of patient's condition.         Nicoletta Dress. Colon Branch, MD 09/25/12 1610  Nicoletta Dress. Colon Branch, MD 09/25/12 [phone]

## 2012-09-25 NOTE — Telephone Encounter (Signed)
[facility] hospitalist calling to say patient admitted to [facility] with gastro-intestinal bleed. Should he stay on his votrient? Per dr Clelia Croft, hold for now.

## 2012-09-25 NOTE — Progress Notes (Signed)
[name]  Jeffrey Trujillo ZOX:[id number] DOB: May 25, 1951 DOA: 09/25/2012 PCP: [name], MD  Oncologist: Dr. Eli Hose   Assessment/Plan:  GI Bleed Patient with multiple episodes of BRBPR, mild abdominal pain, and a 3 gm drop in hgb. Hx of Diverticulosis Recent Indomethacin use for gout in right thumb Has had 2 doses of Votrient chemotherapy. Had "the flu" last week. Appreciate GI Consultation Tobi Bastos and [name]).  Patient going for colonoscopy this afternoon Check CBC q. 8 Transfuse if appropriate   History of RCC s/p nephrectomy with mets to the remaining kidney and pancreas. Creatinine at baseline at 1.4 No difficulty with urination or back pain. I spoke with [name] via his Nurse.  He requested that the Votrient be held during this admission.  Gout in right thumb Swelling  with decreased ROM. 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  Possibly 48 - 72 hours  Consultants:  Gastroenterology  Procedures: Colonoscopy  Antibiotics:    HPI/Subjective: Patient reports an additional episode of BRBPR after admission (at approx 5 am).  His abdominal pain has been mild to moderate, but not severe.    Jeffrey Trujillo is an 62 y.o. male. Middle-aged Caucasian gentleman status post nephrectomy for renal cell carcinoma, now with recurrence in the opposite kidney and with metastasis to the pancreas, started oral chemotherapy 2 days ago. He is prone to bouts and had a flare in his right thumb a couple days ago and started indomethacin yesterday, despite prior cautionary advice to avoid NSAIDs due to having a solitary kidney.  The record shows that an endoscopy by [name] in February 2001, showed Barrett's esophagus and pancolonic  diverticulosis.   The patient awoke at about 1:30 this morning with an urge to have a bowel movement and passed bright red blood per. He has subsequently had 4 more episodes, including one in the emergency room, and estimates he lost about a couple of blood. He denies any prior episodes of bright red blood or melena stool, although the records show he had an episode 2 years ago. He denies any associated dizziness or diaphoresis or palpitation. He did have an episode of vomiting prior to coming to the emergency room. He did have mild periumbilical discomfort which is resolved with morphine in the emergency room.  Hemoglobin was noted to be 12.9 on November 19 this morning hemoglobin is 9.9, but patient denies any dizziness diaphoresis or palpitations.  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   Objective: Filed Vitals:   09/25/12 0529 09/25/12 0558 09/25/12 0801 09/25/12 1139  BP: 113/69 115/70 118/78 130/66  Pulse: 63 69 64 57  Temp:   97.9 F (36.6 C) 97.5 F (36.4 C)  TempSrc:   Oral Oral  Resp: 16  18 18   Height:      Weight:      SpO2: 99% 98% 100% 100%   No intake or output data in the 24 hours ending 09/25/12 1352 Filed Weights   09/25/12 0335  Weight: 83.008 kg (183 lb)    Exam:   General:  Alert and oriented, no apparent distress, appears comfortable lying in bed.  Cardiovascular: Regular rate and rhythm, no obvious murmurs rubs or gallops  Respiratory: Clear to auscultation, no wheezes  crackles or rales  Abdomen: Soft, nontender, nondistended, positive bowel sounds  Extremities: Show no clubbing cyanosis or edema. Able to move all 4 easily.  Right thumb swollen with decreased ROM, but no heat (dolar) or erythema.  Data Reviewed: Basic Metabolic Panel:  Lab 09/25/12 1610  NA 140  K 3.5  CL 104  CO2 26  GLUCOSE 120*  BUN 23  CREATININE 1.47*  CALCIUM 9.5  MG 1.9  PHOS --   Liver Function Tests:  Lab 09/25/12 0335  AST 69*  ALT  95*  ALKPHOS 95  BILITOT 0.5  PROT 7.4  ALBUMIN 3.3*   CBC:  Lab 09/25/12 0335  WBC 5.7  NEUTROABS 4.4  HGB 9.9*  HCT 28.7*  MCV 98.6  PLT 225     Studies: No results found.  Scheduled Meds:   . meperidine      . midazolam      . pantoprazole (PROTONIX) IV  40 mg Intravenous Q12H  . promethazine  25 mg Intravenous On Call to OR  . sodium chloride  3 mL Intravenous Q12H  . topiramate  25 mg Oral BID   Continuous Infusions:   . sodium chloride    . 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 If 8PM-8AM, please contact night-coverage at [email or web address], password [email or web address] 09/25/2012, 1:52 PM  LOS: 0 days     Attending note:  Patient seen and examined.  He is in his room post procedure eating dinner.  He appears to be doing well, no complaints, no shortness of breath, chest pain or dizziness. Hemoglobin has declined due to diverticular bleeding.  Bleeding appears to have stopped.  Will transfuse 1 unit prbc tonight.  Recheck labs in am.  If patient does not have any recurrence of bleeding, he can likely be discharged home tomorrow.  He was advised to avoid any NSAIDs.

## 2012-09-25 NOTE — Op Note (Signed)
[facility] [address] [place], [address]   ENDOSCOPY PROCEDURE REPORT  PATIENT: Jeffrey, Trujillo  MR#: [id number] BIRTHDATE: 05-Mar-1951 , 61  yrs. old GENDER: Male ENDOSCOPIST: R.  Roetta Sessions, MD FACP FACG REFERRED BY:  Eli Hose, M.D. PROCEDURE DATE:  09/25/2012 PROCEDURE:     Diagnostic EGD  INDICATIONS:      Hematochezia; recent NSAID use  INFORMED CONSENT:   The risks, benefits, limitations, alternatives and imponderables have been discussed.  The potential for biopsy, esophogeal dilation, etc. have also been reviewed.  Questions have been answered.  All parties agreeable.  Please see the history and physical in the medical record for more information.  MEDICATIONS:      Versed 4 mg IV and fentanyl 75 mcg IV. Phenergan 25 mg IV. Cetacaine spray.  DESCRIPTION OF PROCEDURE:   The Pentax Gastroscope [id number] endoscope was introduced through the mouth and advanced to the second portion of the duodenum without difficulty or limitations. The mucosal surfaces were surveyed very carefully during advancement of the scope and upon withdrawal.  Retroflexion view of the proximal stomach and esophagogastric junction was performed.      FINDINGS: Salmon-colored epithelium coming up to 34 cm from incisors consistent with prior diagnosis of Barrett's esophagus. No tumor seen. Stomach empty. Small hiatal hernia. Scattered antral erosions. No ulcer or infiltrating process.Patent pylorus. Normal first and second portion of the duodenum.   THERAPEUTIC / DIAGNOSTIC MANEUVERS PERFORMED:  None (Barrett's not biopsied in this particular clinical setting per plan)   COMPLICATIONS:  None  IMPRESSION:     Barrett's esophagus. Small hiatal hernia. Antral erosions of doubtful clinical significance  RECOMMENDATIONS:  Avoid nonsteroidal agents as much as possible. Continue daily PPI indefinitely. See colonoscopy report. Will advance  diet.    _______________________________ R. Roetta Sessions, MD FACP [facility] eSigned:  R. Roetta Sessions, MD FACP [facility] 09/25/2012 3:18 PM     CC:

## 2012-09-25 NOTE — Consult Note (Signed)
Referring Provider: Dr. Karilyn Cota Primary Care Physician:  [name], MD Primary Gastroenterologist:  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 Recently started on Votrient for chemotherapy a few days ago. Hx of Barrett's esophagus diagnosed in Feb 2011 by [name], with colonoscopy at that time showing pan-colonic diverticulosis. Presented early this morning with bright red blood per rectum. Admitting Hgb 9.9, with prior records showing Hgb around 12.9 in November of last year.   Woke up this morning around 1:30 passing bright red blood per rectum. Denies abdominal pain at that time. Had 4 total episodes at home, one since admitted to the hospital. Last episode of rectal bleeding around 5:30 or 6am. States after bleeding started noted periumbilical abdominal pain, now improving. 1 episode of N/V. Denies nausea currently. Feels weak. Took Indomethacin yesterday morning.   Denies any upper GI symptoms such as dysphagia, reflux. Has not been on a PPI. As mentioned previously, started Votrient a few days ago.    Past Medical History  Diagnosis Date  . Gout   . Cancer     "was told has cancer on other kidney and spot on pancreas"  . 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 DJD (degenerative joint disease) of cervical spine   . Diverticulosis of colon     by colonoscopy 11/02/2009  . Barrett's esophagus     by EGD 11/02/2009    Past Surgical History  Procedure Date  . Nephrectomy     right due to cancer  . Lung removal, partial     left lower lobe, benign  . Eus 08/07/2012    Procedure: UPPER ENDOSCOPIC ULTRASOUND (EUS) LINEAR;  Surgeon: Rachael Fee, MD;  Location: [facility] ENDOSCOPY;  Service: Endoscopy;  Laterality: N/A;  . Inguinal hernia  repair   . Hernia repair   . Inguinal hernia repair     Right  . Eus 08/21/2012    Procedure: UPPER ENDOSCOPIC ULTRASOUND (EUS) LINEAR;  Surgeon: Rachael Fee, MD;  Location: [facility] ENDOSCOPY;  Service: Endoscopy;  Laterality: N/A;  . Colonoscopy Feb 2011    [name]: single anal papilla, pan-colonic diverticula  . Esophagogastroduodenoscopy Feb 2011    [name]: salmon-colored epithelium consistent with Barrett's, nodular antral erosions, small hiatal hernia, negative H.pylori    Prior to Admission medications   Medication Sig Start Date End Date Taking? Authorizing Provider  indomethacin (INDOCIN) 50 MG capsule Take 50 mg by mouth as needed. For gout flares   Yes Historical Provider, MD  pazopanib (VOTRIENT) 200 MG tablet Take 4 tablets (800 mg total) by mouth daily. Take on an empty stomach. 09/05/12  Yes Benjiman Core, MD  topiramate (TOPAMAX) 25 MG tablet Take 25 mg by mouth 2 (two) times daily.   Yes Historical Provider, MD    Current Facility-Administered Medications  Medication Dose Route Frequency Provider Last Rate Last Dose  . 0.9 % NaCl with KCl 20 mEq/ L  infusion   Intravenous Continuous Vania Rea, MD      . HYDROmorphone (DILAUDID) injection 0.5 mg  0.5 mg Intravenous Q2H PRN Vania Rea, MD      . ondansetron [facility]) injection 4 mg  4 mg Intravenous Q4H PRN Vania Rea, MD      .  pantoprazole (PROTONIX) injection 40 mg  40 mg Intravenous Q12H Vania Rea, MD      . polyethylene glycol-electrolytes (NuLYTELY/GoLYTELY) solution 4,000 mL  4,000 mL Oral Once Nira Retort, NP      . promethazine (PHENERGAN) injection 25 mg  25 mg Intravenous On Call to OR Nira Retort, NP      . sodium chloride 0.9 % injection 3 mL  3 mL Intravenous Q12H Vania Rea, MD      . topiramate (TOPAMAX) tablet 25 mg  25 mg Oral BID Vania Rea, MD        Allergies as of 09/25/2012 - Review Complete 09/25/2012  Allergen Reaction Noted  . Nsaids Other (See Comments)  06/05/2011  . Meperidine hcl Hives and Rash     Family History  Problem Relation Age of Onset  . Lung cancer Brother   . Prostate cancer Brother   . Colon cancer Neg Hx     History   Social History  . Marital Status: Married    Spouse Name: N/A    Number of Children: N/A  . Years of Education: N/A   Occupational History  .      textiles in [place]    Social History Main Topics  . Smoking status: Never Smoker   . Smokeless tobacco: Never Used     Comment: occasional cigar  . Alcohol Use: Yes     Comment: vodka and budweiser, sometimes daily, depends on notion. couple drinks  . Drug Use: No  . Sexually Active: Yes   Other Topics Concern  . Not on file   Social History Narrative  . No narrative on file    Review of Systems: Gen: +chills, hungry and thirsty CV: Denies chest pain, heart palpitations, syncope, edema  Resp: +DOE GI: SEE HPI GU : Denies urinary burning, urinary frequency, urinary incontinence.  MS: right thumb gout Derm: Denies rash, itching, dry skin Psych: Denies depression, anxiety,confusion, or memory loss Heme:SEE HPI  Physical Exam: Vital signs in last 24 hours: Temp:  [97.9 F (36.6 C)-98 F (36.7 C)] 97.9 F (36.6 C) (01/09 0801) Pulse Rate:  [63-75] 64  (01/09 0801) Resp:  [16-18] 18  (01/09 0801) BP: (97-140)/(64-78) 118/78 mmHg (01/09 0801) SpO2:  [98 %-100 %] 100 % (01/09 0801) Weight:  [183 lb (83.008 kg)] 183 lb (83.008 kg) (01/09 0335)   General:   Alert,  Well-developed, well-nourished, pleasant and cooperative in NAD Head:  Normocephalic and atraumatic. Eyes:  Sclera clear, no icterus.   Conjunctiva pink. Ears:  Normal auditory acuity. Nose:  No deformity, discharge,  or lesions. Mouth:  No deformity or lesions, dentition normal. Neck:  Supple; no masses or thyromegaly. Lungs:  Clear throughout to auscultation.   No wheezes, crackles, or rhonchi. No acute distress. Heart:  Regular rate and rhythm; no murmurs, clicks,  rubs,  or gallops. Abdomen:  Soft, nontender and nondistended. No masses, hepatosplenomegaly or hernias noted. Normal bowel sounds, without guarding, and without rebound.   Rectal:  Deferred until time of colonoscopy.   Msk:  Symmetrical without gross deformities. Normal posture. Pulses:  Normal pulses noted. Extremities:  Without clubbing or edema. Neurologic:  Alert and  oriented x4;  grossly normal neurologically. Skin:  Intact without significant lesions or rashes. Cervical Nodes:  No significant cervical adenopathy. Psych:  Alert and cooperative. Normal mood and affect.  Intake/Output from previous day:   Intake/Output this shift:    Lab Results:  Basename 09/25/12 0335  WBC 5.7  HGB 9.9*  HCT 28.7*  PLT 225   BMET  Basename 09/25/12 0335  NA 140  K 3.5  CL 104  CO2 26  GLUCOSE 120*  BUN 23  CREATININE 1.47*  CALCIUM 9.5   LFT  Basename 09/25/12 0335  PROT 7.4  ALBUMIN 3.3*  AST 69*  ALT 95*  ALKPHOS 95  BILITOT 0.5  BILIDIR --  IBILI --   PT/INR  Basename 09/25/12 0335  LABPROT 13.0  INR 0.99   Hepatitis Panel No results found for this basename: HEPBSAG,HCVAB,HEPAIGM,HEPBIGM in the last 72 hours C-Diff No results found for this basename: CDIFFTOX:3 in the last 72 hours  Studies/Results: No results found.  Impression: 62 year old male with hx of renal carcinoma s/p nephrectomy, now with recurrent renal carcinoma and metastasis to pancreas. Presented with large volume hematochezia and acute blood loss anemia with Hgb 9.9, which had previously been in the 12.9 range Nov 2013. He has a hx notable for a colonoscopy in Feb 2011 noting pancolonic diverticulosis, and an upper endoscopy with biopsy proven Barrett's. He had been started on Votrient, a chemotherapy agent, a few days ago. Likely dealing with diverticular bleed, less likely rapid transit from UGI source. I am not as familiar with Votrient, which he started a few days ago; however, he has been  taking indomethacin. No anticoagulation. For this reason, a colonoscopy will be scheduled for later today, with POSSIBLE upper endoscopy if necessary. As of note, EUS in Dec 2013 noted as normal esophagus by Dr. Christella Hartigan.   Also noted: slight elevation of transaminases noted with severe fatty liver on MRI in Dec 2013; pt drinks alcohol regularly. Likely AST/ALT transaminases secondary to this. Can follow as outpatient.   Plan: Colonoscopy +/- EGD with [name] today: pt and wife understand risks and benefits. They agree.  Phenergan 25 mg IV on call secondary to hx of ETOH abuse NPO except for Golytely Tap water enema X 2 at completion of prep PPI for GI prophylaxis If no EGD today, needs outpatient surveillance for Barrett's Outpatient f/u of LFTs    LOS: 0 days   Gerrit Halls  09/25/2012, 8:42 AM

## 2012-09-25 NOTE — H&P (Addendum)
[facility] History and Physical  [name]  [name]:[id number]  DOB: January 01, 1951   DOA: 09/25/2012   PCP:   [name], MD   Chief Complaint:  Bright red blood per rectum since 1:30 this morning  HPI: Jeffrey Trujillo is an 62 y.o. male.   Middle-aged Caucasian gentleman status post nephrectomy for renal cell carcinoma, now with recurrence in the opposite kidney and with metastasis to the pancreas, started oral chemotherapy 2 days ago. He is prone to bouts and had a flare in his right thumb a couple days ago and started indomethacin yesterday, despite prior cautionary advice to avoid NSAIDs due to having a solitary kidney.  Although patient apparently has no memory of this, the record showed that a endoscopy by [name] in February 201, showed Barrett's esophagus and pancolonic diverticulosis.  The patient awoke at about 1:30 this morning with an urge to have a bowel movement and passed bright red blood per. He has subsequently had 4 more episodes, including one in the emergency room, and estimates he lost about a couple of blood. He denies any prior episodes of bright red blood or melena stool, although the records show he had an episode 2 years ago.  He denies any associated dizziness or diaphoresis or palpitation. He did have an episode of vomiting prior to coming to the emergency room. He did have mild periumbilical discomfort which is resolved with morphine in the emergency room.  Hemoglobin was noted to be 12.9 on November 19 this morning hemoglobin is 9.9, but patient denies any dizziness diaphoresis or palpitations.  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  Rewiew of Systems:   All systems negative except as marked bold or noted in the HPI;  Constitutional:  malaise, fever and chills. ;  Eyes:  eye pain, redness and discharge. ;  ENMT:  ear pain, hoarseness, nasal congestion, sinus pressure and sore throat. ;  Cardiovascular:  chest pain,  palpitations, diaphoresis, dyspnea and peripheral edema. ;  Respiratory:  cough, hemoptysis, wheezing and stridor. ;  Gastrointestinal:  nausea, vomiting x1, diarrhea, constipation, abdominal pain, melena, blood in stool, hematemesis, jaundice and rectal bleeding. unusual weight loss..   Genitourinary:  frequency, dysuria, incontinence,flank pain and hematuria; Musculoskeletal:  back pain and neck pain.  swelling and trauma.;  Skin: .  pruritus, rash, abrasions, bruising and skin lesion.; ulcerations Neuro:  headache, lightheadedness and neck stiffness.  weakness, altered level of consciousness , altered mental status, extremity weakness, burning feet, involuntary movement, seizure and syncope.  Psych:  anxiety, depression, insomnia, tearfulness, panic attacks, hallucinations, paranoia, suicidal or homicidal ideation    Past Medical History  Diagnosis Date  . Gout   . Cancer     "was told has cancer on other kidney and spot on pancreas"  . 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 DJD (degenerative joint disease) of cervical spine   . Diverticulosis of colon     by colonoscopy 11/02/2009  . Barrett's esophagus     by EGD 11/02/2009    Past Surgical History  Procedure Date  . Nephrectomy     right due to cancer  . Lung removal, partial   . Eus 08/07/2012    Procedure: UPPER ENDOSCOPIC ULTRASOUND (EUS) LINEAR;  Surgeon: Rachael Fee, MD;  Location: [facility];  Service: Endoscopy;  Laterality: N/A;  . Inguinal hernia repair   . Hernia repair   . Inguinal hernia repair  Right  . Eus 08/21/2012    Procedure: UPPER ENDOSCOPIC ULTRASOUND (EUS) LINEAR;  Surgeon: Rachael Fee, MD;  Location: [facility];  Service: Endoscopy;  Laterality: N/A;    Medications:  HOME MEDS: Prior to Admission medications   Medication Sig Start Date End Date Taking? Authorizing Provider  indomethacin (INDOCIN) 50 MG capsule Take 50 mg by mouth as needed. For gout flares   Yes Historical  Provider, MD  pazopanib (VOTRIENT) 200 MG tablet Take 4 tablets (800 mg total) by mouth daily. Take on an empty stomach. 09/05/12  Yes Benjiman Core, MD  topiramate (TOPAMAX) 25 MG tablet Take 25 mg by mouth 2 (two) times daily.   Yes Historical Provider, MD     Allergies:  Allergies  Allergen Reactions  . Nsaids Other (See Comments)    Patient only has one kidney.   . Meperidine Hcl Hives and Rash    Social History:   reports that he has never smoked. He has never used smokeless tobacco. He reports that he drinks alcohol. He reports that he does not use illicit drugs.  Family History: Family History  Problem Relation Age of Onset  . Lung cancer Brother   . Prostate cancer Brother      Physical Exam: Filed Vitals:   09/25/12 0400 09/25/12 0501 09/25/12 0529 09/25/12 0558  BP: 97/64 101/67 113/69 115/70  Pulse: 75  63 69  Temp:      TempSrc:      Resp:   16   Height:      Weight:      SpO2:   99% 98%   Blood pressure 115/70, pulse 69, temperature 98 F (36.7 C), temperature source Oral, resp. rate 16, height 5\' 11"  (1.803 m), weight 83.008 kg (183 lb), SpO2 98.00%.  GEN:  Pleasant middle-aged Caucasian gentleman lying in the stretcher in no acute distress; cooperative with exam PSYCH:  alert and oriented x4; does not appear anxious or depressed; affect is appropriate. HEENT: Mucous membranes pink and anicteric; PERRLA; EOM intact; no cervical lymphadenopathy nor thyromegaly or carotid bruit; no JVD; Breasts:: Not examined CHEST WALL: No tenderness CHEST: Normal respiration, clear to auscultation bilaterally HEART: Regular rate and rhythm; no murmurs rubs or gallops BACK: No kyphosis or scoliosis; no CVA tenderness ABDOMEN: , soft non-tender; no masses, no organomegaly, normal abdominal bowel sounds; no pannus; no intertriginous candida. Rectal Exam: Not done EXTREMITIES:  arthropathy of the hands, and marked swelling of the left thumb; no edema; no ulcerations.  Prominent veins in the lower extremities but no edema Genitalia: not examined PULSES: 2+ and symmetric SKIN: Normal hydration no rash or ulceration CNS: Cranial nerves 2-12 grossly intact no focal lateralizing neurologic deficit   Labs on Admission:  Basic Metabolic Panel:  Lab 09/25/12 4540  NA 140  K 3.5  CL 104  CO2 26  GLUCOSE 120*  BUN 23  CREATININE 1.47*  CALCIUM 9.5  MG --  PHOS --   Liver Function Tests:  Lab 09/25/12 0335  AST 69*  ALT 95*  ALKPHOS 95  BILITOT 0.5  PROT 7.4  ALBUMIN 3.3*   No results found for this basename: LIPASE:5,AMYLASE:5 in the last 168 hours No results found for this basename: AMMONIA:5 in the last 168 hours CBC:  Lab 09/25/12 0335  WBC 5.7  NEUTROABS 4.4  HGB 9.9*  HCT 28.7*  MCV 98.6  PLT 225   Cardiac Enzymes: No results found for this basename: CKTOTAL:5,CKMB:5,CKMBINDEX:5,TROPONINI:5 in the last 168 hours  BNP: No components found with this basename: POCBNP:5 D-dimer: No components found with this basename: D-DIMER:5 CBG: No results found for this basename: GLUCAP:5 in the last 168 hours  Radiological Exams on Admission: No results found.    Assessment/Plan Present on Admission:  . Lower GI bleed, likely due to diverticular bleeding  . Anemia due to blood loss . Renal cell cancer . 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 Gout flare . DJD (degenerative joint disease)   PLAN: We'll admit this gentleman for supportive care, and consult gastroenterologist service for assistance with management. Placed him on a clear liquid diet, intravenous proton pump inhibitors, and hold 2 units of blood in reserve in case he develops cardiovascular instability.  his not currently complaining of pain in the thumb, but may need steroids at a later date if gouty pain becomes worse.  Other plans as per orders.  Code Status: FULL CODE  Family Communication: His wife is present at the interview and examination Disposition  Plan: Discharge to home when stable    Jeffrey Trujillo Nocturnist [facility] Pager [phone]   09/25/2012, 7:09 AM

## 2012-09-25 NOTE — ED Notes (Signed)
To bathroom - large amount of bright red blood passed along with stool.

## 2012-09-25 NOTE — Op Note (Signed)
[facility] [address] [place], [address]   COLONOSCOPY PROCEDURE REPORT  PATIENT: Jeffrey Trujillo, Jeffrey Trujillo  MR#:         [id number] BIRTHDATE: 12/20/1950 , 61  yrs. old GENDER: Male ENDOSCOPIST: R.  Roetta Sessions, MD FACP FACG REFERRED BY:  Eli Hose, M.D. PROCEDURE DATE:  09/25/2012 PROCEDURE:     ileocolonoscopy with bleeding control therapy  INDICATIONS: hematochezia; 3 g drop in hemoglobin  INFORMED CONSENT:  The risks, benefits, alternatives and imponderables including but not limited to bleeding, perforation as well as the possibility of a missed lesion have been reviewed.  The potential for biopsy, lesion removal, etc. have also been discussed.  Questions have been answered.  All parties agreeable. Please see the history and physical in the medical record for more information.  MEDICATIONS:   fentanyl 50 mcg and Versed 3 mg IV. Phenergan 25 mg IV  DESCRIPTION OF PROCEDURE:  After a digital rectal exam was performed, the EC-[id number] ([id number])  colonoscope was advanced from the anus through the rectum and colon to the area of the cecum, ileocecal valve and appendiceal orifice.  The cecum was deeply intubated.  These structures were well-seen and photographed for the record.  From the level of the cecum and ileocecal valve, the scope was slowly and cautiously withdrawn.  The mucosal surfaces were carefully surveyed utilizing scope tip deflection to facilitate fold flattening as needed.  The scope was pulled down into the rectum where a thorough examination including retroflexion was performed.    FINDINGS:  Adequate preparation. Normal rectum. Scattered diverticula throughout colon. The only blood in the colon was located coming out of a hepatic flexure diverticulum with associated adherent clot  - please see image 5; the remainder of the colonic mucosa appeared normal. The distal 5 cm of terminal ileal mucosa appeared normal.  THERAPEUTIC /  DIAGNOSTIC MANEUVERS PERFORMED:  The suspect diverticulum noted above was sealed with placement of 3 resolution clip.  COMPLICATIONS: None  CECAL WITHDRAWAL TIME:  16 minutes  IMPRESSION:  Colonic diverticulosis. Suspect diverticular bleeding-hepatic flexure tic-sealed with resolution clips as outlined above  RECOMMENDATIONS: See EGD report.  Note MRI until clips known to have passed.   _______________________________ eSigned:  R. Roetta Sessions, MD FACP [facility] 09/25/2012 3:06 PM   CC:    PATIENT NAME:  Jeffrey Trujillo, Jeffrey Trujillo MR#: [id number]

## 2012-09-25 NOTE — ED Notes (Signed)
C/o mild nausea.

## 2012-09-25 NOTE — ED Notes (Signed)
Pt started chemo med (votrient) yesterday.  Pt has had 4 bouts of rectal bleeding today and is increasingly weak.   Pt denies pain, only c/o weakness

## 2012-09-26 ENCOUNTER — Inpatient Hospital Stay (HOSPITAL_COMMUNITY): Payer: BC Managed Care – PPO

## 2012-09-26 ENCOUNTER — Encounter (HOSPITAL_COMMUNITY): Payer: Self-pay

## 2012-09-26 DIAGNOSIS — D649 Anemia, unspecified: Secondary | ICD-10-CM

## 2012-09-26 DIAGNOSIS — K5731 Diverticulosis of large intestine without perforation or abscess with bleeding: Secondary | ICD-10-CM

## 2012-09-26 LAB — BASIC METABOLIC PANEL
BUN: 12 mg/dL (ref 6–23)
CO2: 26 mEq/L (ref 19–32)
Glucose, Bld: 102 mg/dL — ABNORMAL HIGH (ref 70–99)
Potassium: 4 mEq/L (ref 3.5–5.1)
Sodium: 137 mEq/L (ref 135–145)

## 2012-09-26 LAB — CBC
HCT: 26 % — ABNORMAL LOW (ref 39.0–52.0)
HCT: 25.5 % — ABNORMAL LOW (ref 39.0–52.0)
HCT: 24.2 % — ABNORMAL LOW (ref 39.0–52.0)
Hemoglobin: 8.9 g/dL — ABNORMAL LOW (ref 13.0–17.0)
Hemoglobin: 8.8 g/dL — ABNORMAL LOW (ref 13.0–17.0)
Hemoglobin: 8.5 g/dL — ABNORMAL LOW (ref 13.0–17.0)
MCH: 33.1 pg (ref 26.0–34.0)
MCHC: 35.1 g/dL (ref 30.0–36.0)
MCV: 95.2 fL (ref 78.0–100.0)
MCV: 95.9 fL (ref 78.0–100.0)
RBC: 2.66 MIL/uL — ABNORMAL LOW (ref 4.22–5.81)
RBC: 2.56 MIL/uL — ABNORMAL LOW (ref 4.22–5.81)
RDW: 15 % (ref 11.5–15.5)
WBC: 3.6 10*3/uL — ABNORMAL LOW (ref 4.0–10.5)

## 2012-09-26 LAB — PREPARE RBC (CROSSMATCH)

## 2012-09-26 LAB — GLUCOSE, CAPILLARY

## 2012-09-26 LAB — MRSA PCR SCREENING: MRSA by PCR: NEGATIVE

## 2012-09-26 LAB — HEMOGLOBIN AND HEMATOCRIT, BLOOD: HCT: 25.8 % — ABNORMAL LOW (ref 39.0–52.0)

## 2012-09-26 MED ORDER — SODIUM CHLORIDE 0.9 % IV SOLN
INTRAVENOUS | Status: DC
  Administered 2012-09-26: 100 mL/h via INTRAVENOUS
  Administered 2012-09-26: 200 mL via INTRAVENOUS
  Administered 2012-09-27 – 2012-09-28 (×2): via INTRAVENOUS

## 2012-09-26 MED ORDER — TECHNETIUM TC 99M-LABELED RED BLOOD CELLS IV KIT
25.0000 | PACK | Freq: Once | INTRAVENOUS | Status: AC | PRN
Start: 2012-09-26 — End: 2012-09-26
  Administered 2012-09-26: 23 via INTRAVENOUS

## 2012-09-26 NOTE — Progress Notes (Signed)
Patient receiving blood with no acute distress noted.  Talked with Dr. Kerry Hough concerning patients low blood pressure and vital signs per monitoring.  Patient denies shortness of breath and remains off of O2.  Heart rate in the 60's to 70's with current BP 99/56 and map of 66.  Patient in nuclear medicine with Clinical research associate, Clinical research associate to stay with patient during procedure.  Dr. Caesar Bookman notified of patient with active bleeding and nuclear medicine scan.  Patient with return of color to face. Patient stated he was feeling much better at present.

## 2012-09-26 NOTE — Progress Notes (Signed)
Subjective: Pt reports 2 large bloody stools this morning. Jeffrey Trujillo, [place] witnessed stools and says they were bright red with clots.  Patient reports 5/10 lower abdominal pain. He does respond to pain meds this morning. Denies nausea or vomiting. Hemoglobin 9 g.  Objective: Vital signs in last 24 hours: Temp:  [97.4 F (36.3 C)-98.9 F (37.2 C)] 97.4 F (36.3 C) (01/10 0957) Pulse Rate:  [52-117] 76  (01/10 0957) Resp:  [14-23] 20  (01/10 0957) BP: (94-162)/(36-118) 94/62 mmHg (01/10 0957) SpO2:  [96 %-100 %] 97 % (01/10 0957) Weight:  [182 lb 15.7 oz (83 kg)-183 lb (83.008 kg)] 182 lb 15.7 oz (83 kg) (01/10 0709) Last BM Date: 09/25/12 No LMP for male patient. Body mass index is 25.52 kg/(m^2). General:   Alert,  pleasant and cooperative in NAD.  Appears anxious. Eyes:  Sclera clear, no icterus.   Conjunctiva pink. Mouth:  No deformity or lesions, oropharynx pink & moist. Heart:  Regular rate and rhythm; no murmurs, clicks, rubs, or gallops. Abdomen:   Normal bowel sounds.  Soft, nondistended.  +mild bilateral lower quadrant tenderness to palpation. No masses, hepatosplenomegaly or hernias noted.  No guarding or rebound tenderness.   Msk:  Symmetrical without gross deformities. Pulses:  Normal pulses noted. Extremities:  Without  edema. Neurologic:  Alert and  oriented x4;  grossly normal neurologically. Skin:  Intact without significant lesions or rashes.  Intake/Output from previous day: 01/09 0701 - 01/10 0700 In: 1097.4 [P.O.:240; I.V.:500; Blood:335.4; IV Piggyback:22] Out: 1000 [Urine:1000]  Lab Results:  Basename 09/26/12 0820 09/26/12 0449 09/26/12 0211 09/25/12 1550  WBC -- 3.6* 3.6* 4.5  HGB 9.0* 8.8* 8.9* --  HCT 25.8* 25.5* 26.0* --  PLT -- 186 170 174   BMET  Basename 09/26/12 0211 09/25/12 0335  NA 137 140  K 4.0 3.5  CL 106 104  CO2 26 26  GLUCOSE 102* 120*  BUN 12 23  CREATININE 1.64* 1.47*  CALCIUM 8.3* 9.5   LFT  Basename 09/25/12 0335    PROT 7.4  ALBUMIN 3.3*  AST 69*  ALT 95*  ALKPHOS 95  BILITOT 0.5  BILIDIR --  IBILI --  LIPASE --  AMYLASE --   PT/INR  Basename 09/25/12 0335  LABPROT 13.0  INR 0.99   Assessment: 1. Diverticular bleed status post clip placement x3 yesterday:  Patient reports continued bleeding this morning. Discussed with hospitalist team. Will follow hemoglobin. Patient placed on clear liquids. Hopefully this is old bleeding and bleeding will resolve, however may need flexible sigmoidoscopy if bleeding persists for further intervention 2. Anemia secondary to #1: Monitor, hold off on additional transfusion at this time.    Plan: 1.  recheck H./H. 1600 2. Monitor for continued gross bleeding 3. Rest 4. Clear liquids 5. Consider flex sig if continued bleeding  LOS: 1 day   Lorenza Burton  09/26/2012, 10:32 AM

## 2012-09-26 NOTE — Progress Notes (Signed)
Notified Dr Earlyne Iba and Lorenza Burton from GI due to the patient experiecing bright red blood from his rectum.  He c/o abdominal pain at 6 and cramping.    BP 94/62 Menmon and Kandice aware.  New orders were ordered. Pt did not c/o of SOB or dizziness at the the time of the BM.  Will continue to monitor.

## 2012-09-26 NOTE — Progress Notes (Addendum)
If continues to bleed consider ANGIOGRAPHY OR SURGERY. PT HAS HAD ONE ATTEMPT AT ENDOSCOPIC MANAGEMENT WITH CLIPS/EPI.  REVIEWED.

## 2012-09-26 NOTE — Progress Notes (Signed)
Called to Nuclear Medicine.  Patient up to the bathroom before procedure and feeling dizzy and nauseated.  Upon arrival to nuclear medicine patient was pale.  Placed patient on monitoring system from the ICU and patient with low blood pressure of 88/42. Immediately started and IV fluids and blood transfusion.  Patient verbalized feeling some better.  Dr. Kerry Hough notified of low blood pressure and immediate steps taken at the time for the patient.Writer to stay with patient.  Wife at the bedside.

## 2012-09-26 NOTE — Progress Notes (Signed)
Approxiamately 1500 I was notified to the patients room due to worsening bleeding from the pts rectum.  Pt was found diaphoretic, pale, with chills, and would not respond to name for approx 10-15 seconds.  This was the patients 3 bloody bm in 2 hours. Awaiting H/H that has been drawn for further instruction from GI and/or Dr. Kerry Hough.  Pt was assisted back to the bed by staff and I went to get Dr. Kerry Hough from the dictation room to assess the patient.BP was 129/73 97 HR and 98% room air.  MD came to assess the patient new orders were given and followed.     Dr. Kerry Hough stated that he had spoke with [name] about the situation. Pt is schedule to go to nuc med for a bleeding scan.  The patient appears to be stable enough to go downstairs, he is nauseas and vomiting.  Zofran was given at this time.  I escorted the patient down to Nuc med and at this time he was not vomiting. Voiced to the Nuc Med tech that if there was any problems or concerns to contact Alto Denver of the [facility] because the patient would be going back there instead of coming back to the unit.  She verbalized understanding.    Report was called to Alto Denver, Rn in the [facility] voiced to her according to Dr. Kerry Hough [name] had been notifiedd of the situation as well as Dr. Darrick Penna.  I voiced to her that the blood slip for the 1st unit was on the chart and the pt had a EKG that was negative.  All vital signs except for this am BP had been stable.  Voiced to her that the pt was currently down in the Nuc med for a scan to look for bleeding. She verbalized understanding.

## 2012-09-26 NOTE — Progress Notes (Addendum)
[name]  Jeffrey Trujillo GNF:[id number] DOB: [birth year] DOA: 09/25/2012 PCP: [name], MD  Oncologist: Dr. Eli Hose   Assessment/Plan:  Unfortunately patient experienced hypo umbilical abdominal cramping and 2 or 3 episodes of BRBPR with dark clots this morning.  His bowel output was clear yesterday after colonoscopy prep so I do not think this is residual blood. Will order transfusion of an additional unit of blood given additional bleeding.  GI Bleed Colonoscopy 1/9 showed one adherent clot in the hepatic flexure that was clipped.   Endoscopy showed barrett's esophagus and small antral erosions. Will order transfusion of an additional unit of blood given additional bleeding.  Patient with multiple episodes of BRBPR, mild abdominal pain, and a 3 gm drop in hgb. Hx of Diverticulosis Recent Indomethacin use for gout in right thumb Has had 2 doses of Votrient chemotherapy. Had "the flu" last week. Appreciate GI Consultation Tobi Bastos and [name]).  Check CBC q. 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 Creatinine at baseline at 1.4 No difficulty with urination or back pain. I spoke with [name] via his Nurse.  He requested that the Votrient be held during this admission. Check am bmet 1/11 to ensure kidney function is stable.  Gout in right thumb Swelling  with decreased ROM. 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  Possibly 48 - 72 hours  Consultants:  Gastroenterology  Procedures: Colonoscopy  Antibiotics:    HPI/Subjective: Patient reports an additional episode of BRBPR after admission (at approx 5 am).  His abdominal pain has been mild to moderate, but not severe.    Jeffrey Trujillo is an 62 y.o. male. Middle-aged Caucasian gentleman status post nephrectomy for renal cell carcinoma, now with recurrence in the opposite kidney and with metastasis to the pancreas, started oral chemotherapy 2 days ago. He is prone to bouts and had a flare in his right thumb a couple days ago and started indomethacin yesterday, despite prior cautionary advice to avoid NSAIDs due to having a solitary kidney.  The record shows that an endoscopy by [name] in February 2001, showed Barrett's esophagus and pancolonic diverticulosis.   The patient awoke at about 1:30 this morning with an urge to have a bowel movement and passed bright red blood per. He has subsequently had 4 more episodes, including one in the emergency room, and estimates he lost about a couple of blood. He denies any prior episodes of bright red blood or melena stool, although the records show he had an episode 2 years ago. He denies any associated dizziness or diaphoresis or palpitation. He did have an episode of vomiting prior to coming to the emergency room. He did have mild periumbilical discomfort which is resolved with morphine in the emergency room.  Hemoglobin was noted to be 12.9 on November 19 this morning hemoglobin is 9.9, but patient denies any dizziness diaphoresis or palpitations.  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   Objective: Filed Vitals:   09/25/12 2315 09/25/12 2350 09/26/12 0636 09/26/12 0709  BP: 124/74 133/79 155/84   Pulse: 54 60 66   Temp: 98.6 F (37 C) 98.6 F (37 C) 98.9 F (37.2 C)   TempSrc: Oral  Oral Oral   Resp: 20 20 20    Height:      Weight:    83 kg (182 lb 15.7 oz)  SpO2: 96%  96%     Intake/Output Summary (Last 24 hours) at 09/26/12 0947 Last data filed at 09/26/12 0557  Gross per 24 hour  Intake 1097.42 ml  Output   1000 ml  Net  97.42 ml   Filed Weights   09/25/12 0335 09/25/12 1358 09/26/12 0709  Weight: 83.008 kg (183 lb) 83.008 kg (183 lb)  83 kg (182 lb 15.7 oz)    Exam:   General:  Alert and oriented, no apparent distress,  Significant BRB with clot in toilet bowl  Cardiovascular: Regular rate and rhythm, no obvious murmurs rubs or gallops  Respiratory: Clear to auscultation, no wheezes crackles or rales  Abdomen: Soft, nontender, nondistended, positive bowel sounds  Extremities: Show no clubbing cyanosis or edema. Able to move all 4 easily.  Right thumb swollen with decreased ROM, but no heat (calor) or erythema.    Data Reviewed: Basic Metabolic Panel:  Lab 09/26/12 4540 09/25/12 0335  NA 137 140  K 4.0 3.5  CL 106 104  CO2 26 26  GLUCOSE 102* 120*  BUN 12 23  CREATININE 1.64* 1.47*  CALCIUM 8.3* 9.5  MG -- 1.9  PHOS -- --   Liver Function Tests:  Lab 09/25/12 0335  AST 69*  ALT 95*  ALKPHOS 95  BILITOT 0.5  PROT 7.4  ALBUMIN 3.3*   CBC:  Lab 09/26/12 0820 09/26/12 0449 09/26/12 0211 09/25/12 1550 09/25/12 0335  WBC -- 3.6* 3.6* 4.5 5.7  NEUTROABS -- -- -- -- 4.4  HGB 9.0* 8.8* 8.9* 7.8* 9.9*  HCT 25.8* 25.5* 26.0* 21.8* 28.7*  MCV -- 95.9 95.2 98.2 98.6  PLT -- 186 170 174 225     Studies: No results found.  Scheduled Meds:    . pantoprazole (PROTONIX) IV  40 mg Intravenous Q12H  . sodium chloride  3 mL Intravenous Q12H  . 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 If 8PM-8AM, please contact night-coverage at [email or web address], password [facility] 09/26/2012, 9:47 AM  LOS: 1 day     Attending note:  Patient seen and examined. Agree with note as above.  Unfortunately, he has continued to have crampy lower abdominal pain with repeated episodes of bright red blood per rectum.  GI is considering flex sig.  Currently, he  appears to be hemodynamically stable and asymptomatic with no lightheadedness, chest pain or shortness of breath.  Will repeat another H/H at this time to track progression. Monitor closely.   Addendum:  Informed by nursing staff that patient has had ongoing blood per rectum and is now complaining of dizziness, and decreased responsiveness. Patient evaluated and appears to be markedly different then on earlier evaluation.  Appears to be unwell, diaphoretic.  Discussed with [name] and recommended ordering a bleeding scan.  I have arranged this with radiology.  Will transfuse 2 unit prbc due to ongoing bleeding. Transfer to ICU for closer monitoring.

## 2012-09-26 NOTE — Progress Notes (Signed)
1800 Dr Leticia Penna paged and made aware that he has a consult for pt. Pt currently in Nuc Med for scan.

## 2012-09-27 DIAGNOSIS — K5731 Diverticulosis of large intestine without perforation or abscess with bleeding: Secondary | ICD-10-CM

## 2012-09-27 DIAGNOSIS — K921 Melena: Secondary | ICD-10-CM

## 2012-09-27 DIAGNOSIS — R109 Unspecified abdominal pain: Secondary | ICD-10-CM

## 2012-09-27 DIAGNOSIS — C649 Malignant neoplasm of unspecified kidney, except renal pelvis: Secondary | ICD-10-CM

## 2012-09-27 LAB — BASIC METABOLIC PANEL
BUN: 12 mg/dL (ref 6–23)
CO2: 23 mEq/L (ref 19–32)
Chloride: 106 mEq/L (ref 96–112)
Glucose, Bld: 96 mg/dL (ref 70–99)
Potassium: 3.7 mEq/L (ref 3.5–5.1)
Sodium: 136 mEq/L (ref 135–145)

## 2012-09-27 LAB — CBC WITH DIFFERENTIAL/PLATELET
Basophils Relative: 1 % (ref 0–1)
Basophils Relative: 0 % (ref 0–1)
Eosinophils Absolute: 0 10*3/uL (ref 0.0–0.7)
Eosinophils Absolute: 0.1 10*3/uL (ref 0.0–0.7)
Eosinophils Relative: 2 % (ref 0–5)
HCT: 24.7 % — ABNORMAL LOW (ref 39.0–52.0)
HCT: 24.6 % — ABNORMAL LOW (ref 39.0–52.0)
Hemoglobin: 8.6 g/dL — ABNORMAL LOW (ref 13.0–17.0)
Hemoglobin: 8.5 g/dL — ABNORMAL LOW (ref 13.0–17.0)
Lymphocytes Relative: 13 % (ref 12–46)
Lymphs Abs: 0.7 10*3/uL (ref 0.7–4.0)
Lymphs Abs: 0.5 10*3/uL — ABNORMAL LOW (ref 0.7–4.0)
Lymphs Abs: 0.5 10*3/uL — ABNORMAL LOW (ref 0.7–4.0)
MCH: 31.8 pg (ref 26.0–34.0)
MCH: 32.4 pg (ref 26.0–34.0)
MCHC: 34.6 g/dL (ref 30.0–36.0)
MCHC: 35 g/dL (ref 30.0–36.0)
MCV: 92.1 fL (ref 78.0–100.0)
MCV: 92.6 fL (ref 78.0–100.0)
Monocytes Absolute: 0.4 10*3/uL (ref 0.1–1.0)
Monocytes Absolute: 0.4 10*3/uL (ref 0.1–1.0)
Monocytes Relative: 7 % (ref 3–12)
Monocytes Relative: 9 % (ref 3–12)
Monocytes Relative: 9 % (ref 3–12)
Neutro Abs: 4.3 10*3/uL (ref 1.7–7.7)
Neutrophils Relative %: 79 % — ABNORMAL HIGH (ref 43–77)
Neutrophils Relative %: 77 % (ref 43–77)
Platelets: 175 10*3/uL (ref 150–400)
RBC: 2.66 MIL/uL — ABNORMAL LOW (ref 4.22–5.81)
RBC: 2.67 MIL/uL — ABNORMAL LOW (ref 4.22–5.81)
RBC: 2.56 MIL/uL — ABNORMAL LOW (ref 4.22–5.81)
WBC: 5.5 10*3/uL (ref 4.0–10.5)

## 2012-09-27 LAB — HEMOGLOBIN AND HEMATOCRIT, BLOOD: Hemoglobin: 8.8 g/dL — ABNORMAL LOW (ref 13.0–17.0)

## 2012-09-27 MED ORDER — PANTOPRAZOLE SODIUM 40 MG PO TBEC
40.0000 mg | DELAYED_RELEASE_TABLET | Freq: Every day | ORAL | Status: DC
  Administered 2012-09-27 – 2012-09-30 (×4): 40 mg via ORAL
  Filled 2012-09-27 (×4): qty 1

## 2012-09-27 MED ORDER — ACETAMINOPHEN 10 MG/ML IV SOLN
INTRAVENOUS | Status: AC
  Filled 2012-09-27: qty 100

## 2012-09-27 MED ORDER — ACETAMINOPHEN 325 MG PO TABS
ORAL_TABLET | ORAL | Status: AC
  Filled 2012-09-27: qty 2

## 2012-09-27 MED ORDER — ACETAMINOPHEN 325 MG PO TABS
650.0000 mg | ORAL_TABLET | Freq: Once | ORAL | Status: AC
  Administered 2012-09-27: 650 mg via ORAL
  Filled 2012-09-27: qty 2

## 2012-09-27 MED ORDER — DIPHENHYDRAMINE HCL 25 MG PO CAPS
25.0000 mg | ORAL_CAPSULE | Freq: Once | ORAL | Status: AC
  Administered 2012-09-27: 25 mg via ORAL
  Filled 2012-09-27: qty 1

## 2012-09-27 MED ORDER — ACETAMINOPHEN 10 MG/ML IV SOLN
1000.0000 mg | Freq: Once | INTRAVENOUS | Status: AC
  Administered 2012-09-27: 1000 mg via INTRAVENOUS
  Filled 2012-09-27: qty 100

## 2012-09-27 MED ORDER — ACETAMINOPHEN 325 MG PO TABS
650.0000 mg | ORAL_TABLET | Freq: Four times a day (QID) | ORAL | Status: DC | PRN
  Administered 2012-09-27 – 2012-09-29 (×2): 650 mg via ORAL
  Filled 2012-09-27: qty 2

## 2012-09-27 NOTE — Consult Note (Signed)
Reason for Consult: Lower GI bleed Referring Physician: Gastroenterology/[facility]  Jeffrey Trujillo is an 62 y.o. male.  HPI: Patient presented to [facility] with some history of lower GI bleeding. He underwent a EGD and colonoscopy was the suspected diverticular bleed. Endoscopic treatment was attempted however patient was noted to have recurrence of bleeding 24 hours after the attempted procedure. He initially received a blood transfusion and responded appropriately. He is remained asymptomatic. With the recurrence of the bleeding a bleeding scan was obtained. During the scan he was also given an additional 2 units. His hemoglobin has remained 8.5. The bleeding scan did not demonstrate any evidence of bleeding. Patient is denied any chest pain or shortness of breath. No complaints of lightheadedness. No abdominal tenderness. No similar symptomatology in the past.  Past Medical History  Diagnosis Date  . Gout   . Cancer     "was told has cancer on other kidney and spot on pancreas"  . 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 DJD (degenerative joint disease) of cervical spine   . Diverticulosis of colon     by colonoscopy 11/02/2009  . Barrett's esophagus     by EGD 11/02/2009    Past Surgical History  Procedure Date  . Nephrectomy     right due to cancer  . Lung removal, partial     left lower lobe, benign  . Eus 08/07/2012    Procedure: UPPER ENDOSCOPIC ULTRASOUND (EUS) LINEAR;  Surgeon: Rachael Fee, MD;  Location: [facility] ENDOSCOPY;  Service: Endoscopy;  Laterality: N/A;  . Inguinal hernia repair   . Hernia repair   . Inguinal hernia repair     Right  . Eus 08/21/2012    Procedure: UPPER ENDOSCOPIC ULTRASOUND (EUS) LINEAR;  Surgeon: Rachael Fee, MD;  Location: [facility] ENDOSCOPY;  Service: Endoscopy;  Laterality: N/A;  . Colonoscopy Feb 2011    [name]: single anal papilla, pan-colonic diverticula  . Esophagogastroduodenoscopy Feb 2011    [name]:  salmon-colored epithelium consistent with Barrett's, nodular antral erosions, small hiatal hernia, negative H.pylori    Family History  Problem Relation Age of Onset  . Lung cancer Brother   . Prostate cancer Brother   . Colon cancer Neg Hx   . Lumbar disc disease Brother   . Prostate cancer Brother     Social History:  reports that he has been smoking Cigars.  He has never used smokeless tobacco. He reports that he drinks alcohol. He reports that he does not use illicit drugs.  Allergies:  Allergies  Allergen Reactions  . Nsaids Other (See Comments)    Patient only has one kidney.   . Meperidine Hcl Hives and Rash    Medications:  I have reviewed the patient's current medications. Prior to Admission:  Prescriptions prior to admission  Medication Sig Dispense Refill  . indomethacin (INDOCIN) 50 MG capsule Take 50 mg by mouth as needed. For gout flares      . pazopanib (VOTRIENT) 200 MG tablet Take 4 tablets (800 mg total) by mouth daily. Take on an empty stomach.  120 tablet  0  . topiramate (TOPAMAX) 25 MG tablet Take 25 mg by mouth 2 (two) times daily.       Scheduled:   . acetaminophen      . pantoprazole  40 mg Oral Daily  . sodium chloride  3 mL Intravenous Q12H  . topiramate  25 mg Oral BID  Continuous:   . sodium chloride 50 mL/hr at 09/27/12 1200   ZOX:[id number], HYDROmorphone (DILAUDID) injection, ondansetron (ZOFRAN) IV  Results for orders placed during the hospital encounter of 09/25/12 (from the past 48 hour(s))  CBC     Status: Abnormal   Collection Time   09/25/12  3:50 PM      Component Value Range Comment   WBC 4.5  4.0 - 10.5 K/uL    RBC 2.22 (*) 4.22 - 5.81 MIL/uL    Hemoglobin 7.8 (*) 13.0 - 17.0 g/dL DELTA CHECK NOTED   HCT 21.8 (*) 39.0 - 52.0 %    MCV 98.2  78.0 - 100.0 fL    MCH 35.1 (*) 26.0 - 34.0 pg    MCHC 35.8  30.0 - 36.0 g/dL    RDW 14.7  82.9 - 56.2 %    Platelets 174  150 - 400 K/uL   BASIC METABOLIC PANEL     Status:  Abnormal   Collection Time   09/26/12  2:11 AM      Component Value Range Comment   Sodium 137  135 - 145 mEq/L    Potassium 4.0  3.5 - 5.1 mEq/L    Chloride 106  96 - 112 mEq/L    CO2 26  19 - 32 mEq/L    Glucose, Bld 102 (*) 70 - 99 mg/dL    BUN 12  6 - 23 mg/dL DELTA CHECK NOTED   Creatinine, Ser 1.64 (*) 0.50 - 1.35 mg/dL    Calcium 8.3 (*) 8.4 - 10.5 mg/dL    GFR calc non Af Amer 44 (*) >90 mL/min    GFR calc Af Amer 51 (*) >90 mL/min   CBC     Status: Abnormal   Collection Time   09/26/12  2:11 AM      Component Value Range Comment   WBC 3.6 (*) 4.0 - 10.5 K/uL    RBC 2.73 (*) 4.22 - 5.81 MIL/uL    Hemoglobin 8.9 (*) 13.0 - 17.0 g/dL    HCT 13.0 (*) 86.5 - 52.0 %    MCV 95.2  78.0 - 100.0 fL    MCH 32.6  26.0 - 34.0 pg    MCHC 34.2  30.0 - 36.0 g/dL    RDW 78.4  69.6 - 29.5 %    Platelets 170  150 - 400 K/uL   CBC     Status: Abnormal   Collection Time   09/26/12  4:49 AM      Component Value Range Comment   WBC 3.6 (*) 4.0 - 10.5 K/uL    RBC 2.66 (*) 4.22 - 5.81 MIL/uL    Hemoglobin 8.8 (*) 13.0 - 17.0 g/dL    HCT 28.4 (*) 13.2 - 52.0 %    MCV 95.9  78.0 - 100.0 fL    MCH 33.1  26.0 - 34.0 pg    MCHC 34.5  30.0 - 36.0 g/dL    RDW 44.0  10.2 - 72.5 %    Platelets 186  150 - 400 K/uL   HEMOGLOBIN AND HEMATOCRIT, BLOOD     Status: Abnormal   Collection Time   09/26/12  8:20 AM      Component Value Range Comment   Hemoglobin 9.0 (*) 13.0 - 17.0 g/dL    HCT 36.6 (*) 44.0 - 52.0 %   PREPARE RBC (CROSSMATCH)     Status: Normal   Collection Time   09/26/12 10:30 AM  Component Value Range Comment   Order Confirmation ORDER PROCESSED BY BLOOD BANK     CBC     Status: Abnormal   Collection Time   09/26/12  3:03 PM      Component Value Range Comment   WBC 3.9 (*) 4.0 - 10.5 K/uL    RBC 2.56 (*) 4.22 - 5.81 MIL/uL    Hemoglobin 8.5 (*) 13.0 - 17.0 g/dL    HCT 16.1 (*) 09.6 - 52.0 %    MCV 94.5  78.0 - 100.0 fL    MCH 33.2  26.0 - 34.0 pg    MCHC 35.1  30.0 - 36.0  g/dL    RDW 04.5  40.9 - 81.1 %    Platelets 188  150 - 400 K/uL   GLUCOSE, CAPILLARY     Status: Normal   Collection Time   09/26/12  4:01 PM      Component Value Range Comment   Glucose-Capillary 97  70 - 99 mg/dL    Comment 1 Notify RN     MRSA PCR SCREENING     Status: Normal   Collection Time   09/26/12  9:00 PM      Component Value Range Comment   MRSA by PCR NEGATIVE  NEGATIVE   CBC WITH DIFFERENTIAL     Status: Abnormal   Collection Time   09/27/12  1:07 AM      Component Value Range Comment   WBC 5.5  4.0 - 10.5 K/uL    RBC 2.66 (*) 4.22 - 5.81 MIL/uL    Hemoglobin 8.6 (*) 13.0 - 17.0 g/dL    HCT 91.4 (*) 78.2 - 52.0 %    MCV 92.9  78.0 - 100.0 fL    MCH 32.3  26.0 - 34.0 pg    MCHC 34.8  30.0 - 36.0 g/dL    RDW 95.6  21.3 - 08.6 %    Platelets 179  150 - 400 K/uL    Neutrophils Relative 79 (*) 43 - 77 %    Neutro Abs 4.3  1.7 - 7.7 K/uL    Lymphocytes Relative 13  12 - 46 %    Lymphs Abs 0.7  0.7 - 4.0 K/uL    Monocytes Relative 7  3 - 12 %    Monocytes Absolute 0.4  0.1 - 1.0 K/uL    Eosinophils Relative 0  0 - 5 %    Eosinophils Absolute 0.0  0.0 - 0.7 K/uL    Basophils Relative 1  0 - 1 %    Basophils Absolute 0.0  0.0 - 0.1 K/uL   BASIC METABOLIC PANEL     Status: Abnormal   Collection Time   09/27/12  5:32 AM      Component Value Range Comment   Sodium 136  135 - 145 mEq/L    Potassium 3.7  3.5 - 5.1 mEq/L    Chloride 106  96 - 112 mEq/L    CO2 23  19 - 32 mEq/L    Glucose, Bld 96  70 - 99 mg/dL    BUN 12  6 - 23 mg/dL    Creatinine, Ser 5.78 (*) 0.50 - 1.35 mg/dL    Calcium 7.6 (*) 8.4 - 10.5 mg/dL    GFR calc non Af Amer 49 (*) >90 mL/min    GFR calc Af Amer 57 (*) >90 mL/min   CBC WITH DIFFERENTIAL     Status: Abnormal   Collection Time   09/27/12  7:03 AM      Component Value Range Comment   WBC 4.2  4.0 - 10.5 K/uL    RBC 2.67 (*) 4.22 - 5.81 MIL/uL    Hemoglobin 8.5 (*) 13.0 - 17.0 g/dL    HCT 16.1 (*) 09.6 - 52.0 %    MCV 92.1  78.0 - 100.0  fL    MCH 31.8  26.0 - 34.0 pg    MCHC 34.6  30.0 - 36.0 g/dL    RDW 04.5 (*) 40.9 - 15.5 %    Platelets 180  150 - 400 K/uL    Neutrophils Relative 77  43 - 77 %    Neutro Abs 3.2  1.7 - 7.7 K/uL    Lymphocytes Relative 13  12 - 46 %    Lymphs Abs 0.5 (*) 0.7 - 4.0 K/uL    Monocytes Relative 9  3 - 12 %    Monocytes Absolute 0.4  0.1 - 1.0 K/uL    Eosinophils Relative 1  0 - 5 %    Eosinophils Absolute 0.0  0.0 - 0.7 K/uL    Basophils Relative 1  0 - 1 %    Basophils Absolute 0.0  0.0 - 0.1 K/uL     Nm Gi Blood Loss  09/26/2012  *RADIOLOGY REPORT*  Clinical Data: Rectal bleeding.  Diverticular disease.  NUCLEAR MEDICINE GASTROINTESTINAL BLEEDING STUDY  Technique:  Sequential abdominal images were obtained following intravenous administration of Tc-77m labeled red blood cells.  Radiopharmaceutical: CURIE ULTRATAG TECHNETIUM TC 4M- LABELED RED BLOOD CELLS IV KIT  Comparison: CT abdomen 07/05/2012  Findings: There is no abnormal accumulation of tag red blood cells within the bowel to suggest active gastrointestinal bleeding. Physiologic blood pool activity as well as splenic activity and genitourinary activity is noted.  IMPRESSION: No scintigraphic evidence of active gastrointestinal bleeding.   Original Report Authenticated By: Genevive Bi, M.D.     Review of Systems  Constitutional: Negative for fever and chills.  HENT: Negative.   Eyes: Negative.   Respiratory: Negative for cough, hemoptysis, sputum production, shortness of breath and wheezing.   Cardiovascular: Negative for chest pain and palpitations.  Gastrointestinal: Positive for diarrhea and blood in stool. Negative for heartburn, nausea, vomiting, abdominal pain, constipation and melena.  Genitourinary: Negative.   Musculoskeletal: Negative.   Skin: Negative.   Neurological: Positive for weakness.  Endo/Heme/Allergies: Negative.   Psychiatric/Behavioral: Negative.    Blood pressure 140/64, pulse 50,  temperature 98 F (36.7 C), temperature source Oral, resp. rate 14, height 5\' 11"  (1.803 m), weight 79.6 kg (175 lb 7.8 oz), SpO2 100.00%. Physical Exam  Constitutional: He is oriented to person, place, and time. He appears well-developed and well-nourished. No distress.  HENT:  Head: Normocephalic.  Eyes: Conjunctivae normal and EOM are normal. Pupils are equal, round, and reactive to light.  Neck: Normal range of motion. Neck supple. No tracheal deviation present. No thyromegaly present.  Cardiovascular: Normal rate, regular rhythm and normal heart sounds.   Respiratory: Effort normal and breath sounds normal. No respiratory distress.  GI: Soft. Bowel sounds are normal. He exhibits no distension. There is no tenderness. There is no rebound.  Lymphadenopathy:    He has no cervical adenopathy.  Neurological: He is alert and oriented to person, place, and time.  Skin: Skin is warm and dry.    Assessment/Plan: Lower GI bleed. At this time it appears that the bleed is stable. I did discuss with patient surgical indications including continued rapid bleeding  as well as prolonged intermittent bleeding. At this time the suspicion is to bleeding is from the right colon in a right hemicolectomy would be the planned procedure however he did discuss with the patient that additional sites could be the source of his bleeding and without the evidence on the bleeding scan personally going by what was seen on endoscopy. Future bleeding was discussed as a possible risk as well. At this time he remains asymptomatic. His hemoglobin remained stable and at this time is not a surgical candidate to proceed to the operating room. Will continue to follow his course. We will continue to follow his hemoglobin closely. Brisk bleeding requiring rapid transfusion or persistent episodes of bleeding leading to the administration over 6-8 units of blood will be the anticipated threshold although I did discuss with the patient  that we'll continue discuss surgical options. He understands what we have discussed and is comfortable with the plan.  Azya Barbero C 09/27/2012, 1:11 PM

## 2012-09-27 NOTE — Progress Notes (Signed)
Subjective; Patient noted bright red blood per rectum at noon none thereafter. He also has noted intermittent abdominal cramps. He denies nausea or vomiting. Objective. BP 140/64  Pulse 50  Temp 98 F (36.7 C) (Oral)  Resp 14  Ht 5\' 11"  (1.803 m)  Wt 175 lb 7.8 oz (79.6 kg)  BMI 24.48 kg/m2  SpO2 100% He appears pale but in no acute distress. Abdomen is symmetrical. Bowel sounds are normal. It is soft and nontender. Lab data; H&H on this morning 8.5 and 24.6. H&H 8.8 and 25.8 from 1:46 PM today. Electrolytes are normal, BUN out and creatinine 1.49. Assessment; Recurrent GI bleed with anemia and need for transfusion. Patient has received 2 units of PRBCs. He underwent colonoscopy and EGD by [name] 2 days ago. He had hemoclips place 2 diverticulum at hepatic flexure felt to be source of blood loss.  He's been passing blood per rectum intermittently. GI bleeding scan last evening was negative. He also has been evaluated by [name] of surgical service and being closely monitored. If bleeding continues options would include abdominal angiography repeat colonoscopy or surgical intervention. Given history of metastatic renal cell carcinoma need to also consider bleed from small bowel. If bleeding continues consider small bowel given study. Recommendations; Will continue to monitor patient with serial H&H. Further recommendations will depend on his clinical course.

## 2012-09-27 NOTE — Progress Notes (Signed)
Assisted patient to bathroom and patient had a bright, red, bloody stool with clots noted.  Dr. Karilyn Cota on the floor to talk to patients family and showed Dr. Karilyn Cota stool content.  Order for stat cbc.

## 2012-09-27 NOTE — Progress Notes (Signed)
Chart reviewed.  D/w RN  [name]  Jeffrey Trujillo JWJ:[id number] DOB: 09-28-1950 DOA: 09/25/2012 PCP: [name], MD  Oncologist: Dr. Eli Hose   Assessment/Plan:  GI Bleed No hematochezia or melena since yesterday afternoon. Bleeding scan negative. Blood pressure stable  Colonoscopy 1/9 showed one adherent clot in the hepatic flexure that was clipped.   Endoscopy showed barrett's esophagus and small antral erosions. Will order transfusion of an additional unit of blood given additional bleeding. Patient with multiple episodes of BRBPR, mild abdominal pain, and a 3 gm drop in hgb. Hx of Diverticulosis Recent Indomethacin use for gout in right thumb Has had 2 doses of Votrient chemotherapy. Had "the flu" last week.  Change to step down monitoring. Start clears.  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 Creatinine at baseline at 1.4 Per 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 Improved with Tylenol. No stool since yesterday. No abdominal pain. Gout pain improved.  Objective: Filed Vitals:   09/27/12 0400 09/27/12 0415 09/27/12 0500 09/27/12 0600  BP: 97/62 112/84 110/56 111/57  Pulse: 46 45 43 42  Temp: 98.8 F (37.1 C)     TempSrc: Oral     Resp: 14 15 10 12   Height:      Weight: 79.6 kg (175 lb 7.8 oz)     SpO2: 99% 98% 98% 98%    Intake/Output Summary (Last 24 hours) at 09/27/12 0833 Last data filed at 09/27/12 0600  Gross per 24 hour  Intake 4628.17 ml  Output    700 ml  Net 3928.17 ml   Filed Weights   09/26/12 0709 09/26/12 2124 09/27/12 0400  Weight: 83 kg (182 lb 15.7 oz) 76.4 kg (168 lb 6.9 oz) 79.6 kg (175 lb 7.8 oz)      Exam:   General:  Alert and oriented, no apparent distress  Cardiovascular: Regular rate and rhythm, no obvious murmurs rubs or gallops  Respiratory: Clear to auscultation, no wheezes crackles or rales  Abdomen: Soft, nontender, nondistended, positive bowel sounds  Extremities: Show no clubbing cyanosis or edema. Able to move all 4 easily.  Right thumb indurated but no tenderness erythema or warmth  Data Reviewed: Basic Metabolic Panel:  Lab 09/27/12 6213 09/26/12 0211 09/25/12 0335  NA 136 137 140  K 3.7 4.0 3.5  CL 106 106 104  CO2 23 26 26   GLUCOSE 96 102* 120*  BUN 12 12 23   CREATININE 1.49* 1.64* 1.47*  CALCIUM 7.6* 8.3* 9.5  MG -- -- 1.9  PHOS -- -- --   Liver Function Tests:  Lab 09/25/12 0335  AST 69*  ALT 95*  ALKPHOS 95  BILITOT 0.5  PROT 7.4  ALBUMIN 3.3*   CBC:  Lab 09/27/12 0703 09/27/12 0107 09/26/12 1503 09/26/12 0820 09/26/12 0449 09/26/12 0211 09/25/12 0335  WBC 4.2 5.5 3.9* -- 3.6* 3.6* --  NEUTROABS 3.2 4.3 -- -- -- -- 4.4  HGB 8.5* 8.6* 8.5* 9.0* 8.8* -- --  HCT 24.6* 24.7* 24.2* 25.8* 25.5* -- --  MCV 92.1 92.9 94.5 -- 95.9 95.2 --  PLT 180 179 188 -- 186 170 --     Studies: Nm Gi Blood Loss  09/26/2012  *RADIOLOGY REPORT*  Clinical Data: Rectal bleeding.  Diverticular disease.  NUCLEAR MEDICINE GASTROINTESTINAL BLEEDING STUDY  Technique:  Sequential abdominal images were obtained following intravenous administration of Tc-88m labeled red blood cells.  Radiopharmaceutical: CURIE ULTRATAG TECHNETIUM TC 37M- LABELED RED BLOOD CELLS IV KIT  Comparison: CT abdomen 07/05/2012  Findings: There is no abnormal accumulation of tag red blood cells within the bowel to suggest active gastrointestinal bleeding. Physiologic blood pool activity as well as splenic activity and genitourinary activity is noted.  IMPRESSION: No scintigraphic evidence of active gastrointestinal bleeding.   Original Report Authenticated By: Genevive Bi, M.D.      Scheduled Meds:    . acetaminophen      . pantoprazole (PROTONIX) IV  40 mg Intravenous Q12H  . sodium chloride  3 mL Intravenous Q12H  . topiramate  25 mg Oral BID   Continuous Infusions:    . sodium chloride 100 mL/hr at 09/27/12 0600   Time spent: 40 minutes  Sahmir Weatherbee L  [facility] Pager [phone]. If 8PM-8AM, please contact night-coverage at [email or web address], password [facility] 09/27/2012, 8:33 AM  LOS: 2 days

## 2012-09-27 NOTE — Progress Notes (Signed)
Notified Dr. Orvan Falconer that pt has HA 6/10 but HR is in the 40's and 50's but SBP is 120's and DBP is upper 50's to 60's and I did not feel comfortable giving pt dilaudid. Dr. Orvan Falconer ordered a one time dose of acetaminophen 1000 mg IV.

## 2012-09-28 LAB — CBC WITH DIFFERENTIAL/PLATELET
Basophils Absolute: 0 10*3/uL (ref 0.0–0.1)
Basophils Relative: 1 % (ref 0–1)
Eosinophils Absolute: 0.1 10*3/uL (ref 0.0–0.7)
Eosinophils Absolute: 0.1 10*3/uL (ref 0.0–0.7)
Eosinophils Relative: 2 % (ref 0–5)
HCT: 29.3 % — ABNORMAL LOW (ref 39.0–52.0)
Hemoglobin: 10.1 g/dL — ABNORMAL LOW (ref 13.0–17.0)
Lymphs Abs: 0.6 10*3/uL — ABNORMAL LOW (ref 0.7–4.0)
MCH: 32.3 pg (ref 26.0–34.0)
MCH: 33.1 pg (ref 26.0–34.0)
MCHC: 34.5 g/dL (ref 30.0–36.0)
MCHC: 34.8 g/dL (ref 30.0–36.0)
MCV: 95.3 fL (ref 78.0–100.0)
Monocytes Absolute: 0.3 10*3/uL (ref 0.1–1.0)
Monocytes Relative: 7 % (ref 3–12)
Platelets: 257 10*3/uL (ref 150–400)
RDW: 15.4 % (ref 11.5–15.5)

## 2012-09-28 NOTE — Progress Notes (Signed)
Subjective; Patient states he is hungry. He denies nausea or vomiting. Presently he is pain-free but he did wake up around 4 AM with abdominal cramps. He has not had a BM in 15 hours. Objective; BP 131/63  Pulse 45  Temp 97.7 F (36.5 C) (Oral)  Resp 13  Ht 5\' 11"  (1.803 m)  Wt 171 lb 4.8 oz (77.7 kg)  BMI 23.89 kg/m2  SpO2 100% Patient is alert and in no acute distress. Abdomen is flat with normal bowel sounds; it is soft and nontender. No LE edema noted. Lab data; WBC 4.3, H&H 10.1 and 29.3. Platelet count 211K. Assessment; Recurrent GI bleed secondary to a diverticulum at hepatic flexure. Status post endoscopic therapy 3 days ago. His appears he has stopped bleeding. He received a unit of PRBCs last evening. Metastatic renal cell carcinoma. Patient was begun on Pazopanib but he only took 2 doses prior to admission. This medication can be resumed on discharge.  Recommendations; Advance diet to full liquids. Continue to monitor H&H closely for the next 24 hours. If he rebleeds will offer repeat colonoscopy.

## 2012-09-28 NOTE — Progress Notes (Signed)
Over the past 24 hours, patient had a few bloody stools. Received a unit of packed red blood cells  [name]  Jeffrey Trujillo ZOX:[id number] DOB: 10-21-1950 DOA: 09/25/2012 PCP: [name], MD  Oncologist: Dr. Eli Hose   Assessment/Plan:  GI Bleed Hemoglobin has risen appropriately post transfusion. We'll continue clear liquids and monitor H&H.  Colonoscopy 1/9 showed one adherent clot in the hepatic flexure that was clipped.   Endoscopy showed barrett's esophagus and small antral erosions. Will order transfusion of an additional unit of blood given additional bleeding. Patient with multiple episodes of BRBPR, mild abdominal pain, and a 3 gm drop in hgb. Hx of Diverticulosis Recent Indomethacin use for gout in right thumb Has had 2 doses of Votrient chemotherapy. Had "the flu" last week.  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 Creatinine at baseline at 1.4 Per 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 Some abdominal cramping at 4 this morning, none currently. No stools since last evening. Tolerating clear liquids per  Objective: Filed Vitals:   09/28/12 0200 09/28/12 0300 09/28/12 0400 09/28/12 0500  BP: 118/56 108/89 130/61 131/63  Pulse: 46 46 46 45  Temp:   98 F (36.7 C)   TempSrc:   Oral   Resp: 12 14 13 13   Height:      Weight:    77.7 kg (171 lb 4.8 oz)  SpO2: 100% 99% 100% 100%    Intake/Output Summary (Last 24 hours) at 09/28/12 0834 Last data filed at 09/28/12 0500  Gross per 24 hour  Intake   4153 ml  Output   2500 ml  Net   1653 ml   Filed Weights   09/26/12 2124 09/27/12 0400 09/28/12 0500  Weight: 76.4 kg (168 lb 6.9 oz) 79.6 kg  (175 lb 7.8 oz) 77.7 kg (171 lb 4.8 oz)    Exam:   General:  Alert and oriented, no apparent distress  Cardiovascular: Regular rate and rhythm, no obvious murmurs rubs or gallops  Respiratory: Clear to auscultation, no wheezes crackles or rales  Abdomen: Soft, nontender, nondistended, positive bowel sounds  Extremities: Show no clubbing cyanosis or edema. Able to move all 4 easily.  Right thumb indurated but no tenderness erythema or warmth  Data Reviewed: Basic Metabolic Panel:  Lab 09/27/12 8119 09/26/12 0211 09/25/12 0335  NA 136 137 140  K 3.7 4.0 3.5  CL 106 106 104  CO2 23 26 26   GLUCOSE 96 102* 120*  BUN 12 12 23   CREATININE 1.49* 1.64* 1.47*  CALCIUM 7.6* 8.3* 9.5  MG -- -- 1.9  PHOS -- -- --   Liver Function Tests:  Lab 09/25/12 0335  AST 69*  ALT 95*  ALKPHOS 95  BILITOT 0.5  PROT 7.4  ALBUMIN 3.3*   CBC:  Lab 09/28/12 0712 09/27/12 1933 09/27/12 1346 09/27/12 0703 09/27/12 0107 09/26/12 1503 09/25/12 0335  WBC 4.3 3.7* -- 4.2 5.5 3.9* --  NEUTROABS 3.4 2.8 -- 3.2 4.3 -- 4.4  HGB 10.1* 8.3* 8.8* 8.5* 8.6* -- --  HCT 29.3* 23.7* 25.8* 24.6* 24.7* -- --  MCV 93.6 92.6 -- 92.1 92.9 94.5 --  PLT 211 175 -- 180 179 188 --     Studies: Nm Gi Blood Loss  09/26/2012  *RADIOLOGY REPORT*  Clinical Data: Rectal bleeding.  Diverticular disease.  NUCLEAR MEDICINE GASTROINTESTINAL BLEEDING STUDY  Technique:  Sequential abdominal images were obtained following intravenous administration of Tc-66m labeled red blood cells.  Radiopharmaceutical: CURIE ULTRATAG TECHNETIUM TC 17M- LABELED RED BLOOD CELLS IV KIT  Comparison: CT abdomen 07/05/2012  Findings: There is no abnormal accumulation of tag red blood cells within the bowel to suggest active gastrointestinal bleeding. Physiologic blood pool activity as well as splenic activity and genitourinary activity is noted.  IMPRESSION: No scintigraphic evidence of active gastrointestinal bleeding.   Original Report  Authenticated By: Genevive Bi, M.D.     Scheduled Meds:    . pantoprazole  40 mg Oral Daily  . sodium chloride  3 mL Intravenous Q12H  . topiramate  25 mg Oral BID   Continuous Infusions:    . sodium chloride 50 mL/hr at 09/28/12 0500   Time spent: 40 minutes  Jeffrey Trujillo  [facility] Pager [phone]. If 8PM-8AM, please contact night-coverage at [email or web address], password [facility] 09/28/2012, 8:34 AM  LOS: 3 days

## 2012-09-28 NOTE — Progress Notes (Signed)
3 Days Post-Op  Subjective: No complaints.  No pain.  No rectal bleeding since yesterday.  No CP.  No SOB.  No lightheadedness.   Objective: Vital signs in last 24 hours: Temp:  [97.7 F (36.5 C)-98.6 F (37 C)] 98.6 F (37 C) (01/12 2000) Pulse Rate:  [44-86] 58  (01/12 2000) Resp:  [9-22] 20  (01/12 2000) BP: (98-144)/(56-89) 122/63 mmHg (01/12 2000) SpO2:  [91 %-100 %] 100 % (01/12 2000) Weight:  [77.7 kg (171 lb 4.8 oz)] 77.7 kg (171 lb 4.8 oz) (01/12 0500) Last BM Date: 09/27/12  Intake/Output from previous day: 01/11 0701 - 01/12 0700 In: 4353 [P.O.:2400; I.V.:1590.5; Blood:362.5] Out: 2750 [Urine:2750] Intake/Output this shift: Total I/O In: 50 [I.V.:50] Out: 400 [Urine:400]  General appearance: alert and no distress GI: soft, non-tender; bowel sounds normal; no masses,  no organomegaly  Lab Results:   Basename 09/28/12 1850 09/28/12 0712  WBC 5.6 4.3  HGB 9.8* 10.1*  HCT 28.2* 29.3*  PLT 257 211   BMET  Basename 09/27/12 0532 09/26/12 0211  NA 136 137  K 3.7 4.0  CL 106 106  CO2 23 26  GLUCOSE 96 102*  BUN 12 12  CREATININE 1.49* 1.64*  CALCIUM 7.6* 8.3*   PT/INR No results found for this basename: LABPROT:2,INR:2 in the last 72 hours ABG No results found for this basename: PHART:2,PCO2:2,PO2:2,HCO3:2 in the last 72 hours  Studies/Results: No results found.  Anti-infectives: Anti-infectives    None      Assessment/Plan: s/p Procedure(s) (LRB) with comments: COLONOSCOPY (N/A) ESOPHAGOGASTRODUODENOSCOPY (EGD) (N/A) Lower GI bleed.  Episode of rebleed noted yesterday.  Recieved 1 unit PRBC and had appropriate response.  HG remains stable.  Patient asymptomatic.  Again discussed surgical indications with the patient.    LOS: 3 days    Jeffrey Trujillo C 09/28/2012

## 2012-09-29 DIAGNOSIS — K5731 Diverticulosis of large intestine without perforation or abscess with bleeding: Secondary | ICD-10-CM

## 2012-09-29 LAB — TYPE AND SCREEN
ABO/RH(D): B NEG
Antibody Screen: NEGATIVE
Unit division: 0
Unit division: 0

## 2012-09-29 LAB — CBC WITH DIFFERENTIAL/PLATELET
Basophils Absolute: 0 10*3/uL (ref 0.0–0.1)
Eosinophils Relative: 1 % (ref 0–5)
Lymphocytes Relative: 7 % — ABNORMAL LOW (ref 12–46)
MCV: 93.2 fL (ref 78.0–100.0)
Platelets: 249 10*3/uL (ref 150–400)
RDW: 15.1 % (ref 11.5–15.5)
WBC: 7.5 10*3/uL (ref 4.0–10.5)

## 2012-09-29 MED ORDER — SODIUM CHLORIDE 0.9 % IV SOLN: INTRAVENOUS | Status: DC

## 2012-09-29 MED ORDER — METHYLPREDNISOLONE SODIUM SUCC 125 MG IJ SOLR
60.0000 mg | Freq: Once | INTRAMUSCULAR | Status: AC
  Administered 2012-09-29: 60 mg via INTRAVENOUS
  Filled 2012-09-29: qty 2

## 2012-09-29 MED ORDER — OXYCODONE HCL 5 MG PO TABS
5.0000 mg | ORAL_TABLET | ORAL | Status: DC | PRN
  Administered 2012-09-29: 5 mg via ORAL
  Filled 2012-09-29: qty 1

## 2012-09-29 MED ORDER — PREDNISONE 10 MG PO TABS
50.0000 mg | ORAL_TABLET | Freq: Every day | ORAL | Status: DC
  Administered 2012-09-30: 50 mg via ORAL
  Filled 2012-09-29: qty 2

## 2012-09-29 NOTE — Progress Notes (Signed)
Subjective: Main complaint of gout flare in right elbow. No evidence of rectal bleeding. Notes no BM in 2 days. Mild nausea this morning, no vomiting. Didn't feel like eating breakfast.   Objective: Vital signs in last 24 hours: Temp:  [97.9 F (36.6 C)-98.6 F (37 C)] 98.5 F (36.9 C) (01/13 0400) Pulse Rate:  [50-106] 106  (01/13 0600) Resp:  [9-22] 22  (01/13 0600) BP: (116-145)/(61-84) 142/77 mmHg (01/13 0400) SpO2:  [98 %-100 %] 98 % (01/13 0600) Weight:  [169 lb 1.5 oz (76.7 kg)] 169 lb 1.5 oz (76.7 kg) (01/13 0500) Last BM Date: 09/27/12 General:   Alert and oriented, pleasant Head:  Normocephalic and atraumatic. Eyes:  No icterus, sclera clear. Conjuctiva pink.  Heart:  S1, S2 present, no murmurs noted.  Lungs: Clear to auscultation bilaterally, without wheezing, rales, or rhonchi.  Abdomen:  Bowel sounds present, soft, non-tender, non-distended. No HSM or hernias noted. No rebound or guarding. No masses appreciated  Msk:  Symmetrical without gross deformities. Normal posture. Extremities:  Without clubbing or edema. Neurologic:  Alert and  oriented x4;  grossly normal neurologically. Skin:  Warm and dry, intact without significant lesions.  Psych:  Alert and cooperative. Normal mood and affect.  Intake/Output from previous day: 01/12 0701 - 01/13 0700 In: 4393 [P.O.:3240; I.V.:1153] Out: 3800 [Urine:3800] Intake/Output this shift:    Lab Results:  Basename 09/29/12 0710 09/28/12 1850 09/28/12 0712  WBC 7.5 5.6 4.3  HGB 10.5* 9.8* 10.1*  HCT 30.3* 28.2* 29.3*  PLT 249 257 211   BMET  Basename 09/27/12 0532  NA 136  K 3.7  CL 106  CO2 23  GLUCOSE 96  BUN 12  CREATININE 1.49*  CALCIUM 7.6*     Assessment: 62 year old male admitted with diverticular bleed s/p clips X 3. Surgical consult appreciated. No further evidence of bleeding X 2 days, Hgb improved from yesterday after 1u PRBCs. If re-bleeds, consider TCS, aniography,or surgery depending on  presentation. Also noted Barrett's with antral erosions on EGD.   Plan: Continue to follow H/H Full liquids Supportive measures    LOS: 4 days   Gerrit Halls  09/29/2012, 8:44 AM

## 2012-09-29 NOTE — Progress Notes (Addendum)
[name]  Jeffrey Trujillo XBJ:[id number] DOB: 12-22-1950 DOA: 09/25/2012 PCP: [name], MD  Oncologist: Dr. Eli Hose   Assessment/Plan:  GI Bleed Hgb stable.  No stools in 36+ hours.  Advancing diet.  Transfer to floor. Patient has received a total of 4  units of PRBCs this admission.  Colonoscopy 1/9 showed one adherent clot in the hepatic flexure that was clipped.   Endoscopy showed barrett's esophagus and small antral erosions. Patient with multiple episodes of BRBPR, mild abdominal pain, and a 3 gm drop in hgb. Hx of Diverticulosis Recent Indomethacin use for gout in right thumb Has had 2 doses of Votrient chemotherapy. Had "the flu" last week.  Gout Initially in right thumb - now resolved. 09/29/12 Acute gout attack in right elbow.  Painful, Erythematous and warm Give one dose of solumedrol IV 60 mg. Start on short oral prednisone taper tomorrow. 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 Creatinine at baseline at 1.4 Per 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  Worsened by BP monitoring.  No stools or bleeding.  Objective: Filed Vitals:   09/29/12 0845 09/29/12 0900 09/29/12 1000 09/29/12 1400  BP:    132/79  Pulse: 104 104 106 104  Temp:    100.2 F (37.9 C)  TempSrc:    Oral  Resp: 22 23 23 18   Height:      Weight:      SpO2: 96% 96% 98% 100%    Intake/Output Summary (Last 24 hours) at 09/29/12 1522 Last data filed at 09/29/12 0800  Gross per 24 hour  Intake   2170 ml  Output   2150 ml  Net     20 ml   Filed Weights   09/27/12 0400 09/28/12 0500 09/29/12 0500  Weight: 79.6 kg (175 lb 7.8 oz) 77.7 kg (171 lb 4.8  oz) 76.7 kg (169 lb 1.5 oz)    Exam:   General:  Alert and oriented, lying in bed  Cardiovascular: Regular rate and rhythm, no obvious murmurs rubs or gallops  Respiratory: Clear to auscultation, no wheezes crackles or rales  Abdomen: Soft, nontender, nondistended, positive bowel sounds  Extremities: Show no clubbing cyanosis or edema.  Right elbow does not have edema but is acutely tender to palpation or any movement, it is erythematous and warm to touch.  Data Reviewed: Basic Metabolic Panel:  Lab 09/27/12 3086 09/26/12 0211 09/25/12 0335  NA 136 137 140  K 3.7 4.0 3.5  CL 106 106 104  CO2 23 26 26   GLUCOSE 96 102* 120*  BUN 12 12 23   CREATININE 1.49* 1.64* 1.47*  CALCIUM 7.6* 8.3* 9.5  MG -- -- 1.9  PHOS -- -- --   Liver Function Tests:  Lab 09/25/12 0335  AST 69*  ALT 95*  ALKPHOS 95  BILITOT 0.5  PROT 7.4  ALBUMIN 3.3*   CBC:  Lab 09/29/12 0710 09/28/12 1850 09/28/12 0712 09/27/12 1933 09/27/12 1346 09/27/12 0703  WBC 7.5 5.6 4.3 3.7* -- 4.2  NEUTROABS 6.5 4.4 3.4 2.8 -- 3.2  HGB 10.5* 9.8* 10.1* 8.3* 8.8* --  HCT 30.3* 28.2* 29.3* 23.7* 25.8* --  MCV 93.2 95.3 93.6 92.6 -- 92.1  PLT 249 257 211 175 -- 180  Studies: No results found.  Scheduled Meds:    . pantoprazole  40 mg Oral Daily  . predniSONE  50 mg Oral Q breakfast  . sodium chloride  3 mL Intravenous Q12H  . topiramate  25 mg Oral BID   Continuous Infusions:    . sodium chloride     Time spent: 40 minutes  York, Marianne L, P.A.-C. [facility] Pager [phone]. If 8PM-8AM, please contact night-coverage at [email or web address], password [facility] 09/29/2012, 3:22 PM  LOS: 4 days    Attending note: Patient interviewed and examined. Agree. Discontinue IV fluids. Discontinue telemetry. Likely home tomorrow if stable.

## 2012-09-29 NOTE — Progress Notes (Signed)
4 Days Post-Op  Subjective: No pain. No chest pain. No shortness of breath. No new episodes of rectal bleeding over the last 36 hours.  Objective: Vital signs in last 24 hours: Temp:  [98.1 F (36.7 C)-98.6 F (37 C)] 98.5 F (36.9 C) (01/13 0400) Pulse Rate:  [57-106] 106  (01/13 1000) Resp:  [9-23] 23  (01/13 1000) BP: (116-145)/(61-83) 116/63 mmHg (01/13 0800) SpO2:  [96 %-100 %] 98 % (01/13 1000) Weight:  [76.7 kg (169 lb 1.5 oz)] 76.7 kg (169 lb 1.5 oz) (01/13 0500) Last BM Date: 09/27/12  Intake/Output from previous day: 01/12 0701 - 01/13 0700 In: 4443 [P.O.:3240; I.V.:1203] Out: 3800 [Urine:3800] Intake/Output this shift: Total I/O In: 50 [I.V.:50] Out: -   General appearance: alert and no distress GI: soft, non-tender; bowel sounds normal; no masses,  no organomegaly  Lab Results:   Basename 09/29/12 0710 09/28/12 1850  WBC 7.5 5.6  HGB 10.5* 9.8*  HCT 30.3* 28.2*  PLT 249 257   BMET  Basename 09/27/12 0532  NA 136  K 3.7  CL 106  CO2 23  GLUCOSE 96  BUN 12  CREATININE 1.49*  CALCIUM 7.6*   PT/INR No results found for this basename: LABPROT:2,INR:2 in the last 72 hours ABG No results found for this basename: PHART:2,PCO2:2,PO2:2,HCO3:2 in the last 72 hours  Studies/Results: No results found.  Anti-infectives: Anti-infectives    None      Assessment/Plan: s/p Procedure(s) (LRB) with comments: COLONOSCOPY (N/A) ESOPHAGOGASTRODUODENOSCOPY (EGD) (N/A) Diverticular bleed. At this time continue to monitor. Advance diet as tolerated. Continue to monitor H&H for now. No acute surgical indications. I will continue to follow the patient peripherally.  LOS: 4 days    Hannibal Skalla C 09/29/2012

## 2012-09-29 NOTE — Progress Notes (Signed)
Patient c/o "gout pain" in right elbow. Given dilaudid 0.5mg  iv for pain 10/10 @0811 . Pt stated his pain was down to 5, and tolerable at 0845.

## 2012-09-30 ENCOUNTER — Telehealth: Payer: Self-pay | Admitting: *Deleted

## 2012-09-30 ENCOUNTER — Encounter (HOSPITAL_COMMUNITY): Payer: Self-pay | Admitting: Internal Medicine

## 2012-09-30 LAB — CBC
HCT: 29.7 % — ABNORMAL LOW (ref 39.0–52.0)
Hemoglobin: 10.3 g/dL — ABNORMAL LOW (ref 13.0–17.0)
MCV: 92.5 fL (ref 78.0–100.0)
RBC: 3.21 MIL/uL — ABNORMAL LOW (ref 4.22–5.81)
WBC: 8.7 10*3/uL (ref 4.0–10.5)

## 2012-09-30 LAB — BASIC METABOLIC PANEL
CO2: 23 mEq/L (ref 19–32)
Chloride: 103 mEq/L (ref 96–112)
Sodium: 135 mEq/L (ref 135–145)

## 2012-09-30 MED ORDER — PANTOPRAZOLE SODIUM 40 MG PO TBEC: 40.0000 mg | DELAYED_RELEASE_TABLET | Freq: Every day | ORAL | Status: DC

## 2012-09-30 MED ORDER — PREDNISONE 10 MG PO TABS: 40.0000 mg | ORAL_TABLET | Freq: Every day | ORAL | Status: DC

## 2012-09-30 MED ORDER — OXYCODONE HCL 5 MG PO TABS: 5.0000 mg | ORAL_TABLET | ORAL | Status: DC | PRN

## 2012-09-30 NOTE — Telephone Encounter (Signed)
Spoke with wife, per dr Clelia Croft patient is to resume taking his votrient, upon d/c from the hospital. Wife verbalized understanding and was reminded of future appt here 10/14/12

## 2012-09-30 NOTE — Progress Notes (Signed)
Subjective: No N/V, no abdominal pain. No rectal bleeding. No BM since the 11th. Gout flare improving.   Objective: Vital signs in last 24 hours: Temp:  [97.5 F (36.4 C)-100.2 F (37.9 C)] 97.5 F (36.4 C) (01/14 0507) Pulse Rate:  [47-106] 47  (01/14 0507) Resp:  [14-23] 20  (01/14 0507) BP: (116-132)/(60-79) 127/60 mmHg (01/14 0507) SpO2:  [96 %-100 %] 100 % (01/14 0507) Last BM Date: 09/27/12 General:   Alert and oriented, pleasant Head:  Normocephalic and atraumatic. Eyes:  No icterus, sclera clear. Conjuctiva pink.  Heart:  S1, S2 present, no murmurs noted.  Lungs: Clear to auscultation bilaterally, without wheezing, rales, or rhonchi.  Abdomen:  Bowel sounds present, soft, non-tender, non-distended. No HSM or hernias noted. No rebound or guarding. No masses appreciated  Msk:  Symmetrical without gross deformities. Normal posture. Extremities:  Without clubbing or edema. Neurologic:  Alert and  oriented x4;  grossly normal neurologically. Psych:  Alert and cooperative. Normal mood and affect.  Intake/Output from previous day: 01/13 0701 - 01/14 0700 In: 1013 [P.O.:960; I.V.:53] Out: 2075 [Urine:2075] Intake/Output this shift: Total I/O In: -  Out: 100 [Urine:100]  Lab Results:  [facility] 09/30/12 0515 09/29/12 0710 09/28/12 1850  WBC 8.7 7.5 5.6  HGB 10.3* 10.5* 9.8*  HCT 29.7* 30.3* 28.2*  PLT 283 249 257   BMET  Basename 09/30/12 0515  NA 135  K 4.0  CL 103  CO2 23  GLUCOSE 135*  BUN 14  CREATININE 1.40*  CALCIUM 9.5    Assessment: 62 year old male admitted with diverticular bleed s/p clips X 3. Surgical consult appreciated.  If re-bleeds, would likely pursue colonoscopy with further intervention as necessary.  However, pt is doing well with continued stability, Hgb overall stable, no further signs of bleeding. Anticipate d/c home today.    Plan: Continue PPI, known Barrett's on recent EGD this admission.  Outpatient f/u in 3 months Anticipate d/c  home today    LOS: 5 days   Gerrit Halls  09/30/2012, 7:56 AM

## 2012-09-30 NOTE — Progress Notes (Signed)
UR Chart Review Completed  

## 2012-09-30 NOTE — Discharge Summary (Signed)
Physician Discharge Summary  [name]:[id number] DOB: [birth year] DOA: 09/25/2012  PCP: [name], MD  Admit date: 09/25/2012 Discharge date: 09/30/2012  Time spent: 45 minutes  Recommendations for Outpatient Follow-up:   Check CBC in one to 2 weeks. Patient with severe diverticular bleed. Received 4 units of packed red blood cells during admission.  Patient to receive instructions from [name] office regarding when to resume votrient chemotherapy.  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  Diet recommendation: soft, low-residue, low purine  Filed Weights   09/27/12 0400 09/28/12 0500 09/29/12 0500  Weight: 79.6 kg (175 lb 7.8 oz) 77.7 kg (171 lb 4.8 oz) 76.7 kg (169 lb 1.5 oz)    History of present illness:  Jeffrey Trujillo is an 62 y.o. male. Middle-aged Caucasian gentleman status post nephrectomy for renal cell carcinoma, now with recurrence in the opposite kidney and with metastasis to the pancreas, started oral chemotherapy 2 days ago. He is prone to bouts and had a flare in his right thumb a couple days ago and started indomethacin yesterday, despite prior cautionary advice to avoid NSAIDs due to having a solitary kidney.  The record shows that a endoscopy by [name] in February 201, showed Barrett's esophagus and pancolonic diverticulosis.  The patient awoke at about 1:30 this morning with an urge to have a bowel movement and passed bright red blood per. He has subsequently had 4 more episodes, including one in the emergency room, and estimates he lost about a couple of blood. He denies any prior episodes of bright red blood or melena stool, although the records show he had an episode 2 years ago. He denies any associated dizziness or  diaphoresis or palpitation. He did have an episode of vomiting prior to coming to the emergency room. He did have mild periumbilical discomfort which is resolved with morphine in the emergency room.  Hemoglobin was noted to be 12.9 on November 19 this morning hemoglobin is 9.9, but patient denies any dizziness diaphoresis or palpitations.  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  Hospital Course:   GI Bleed   After admission the patient continued to have bright red blood per rectum with clots. He underwent colonoscopy prep and subsequently had an upper endoscopy and colonoscopy on January 9 by [name].  Upper endoscopy revealed Barrett's esophagus and a small hiatal hernia.   Colonoscopy showed one adherent clot in the hepatic flexure.  3 clips were applied to this area. The procedures concluded without incident.  Unfortunately on January 10 the patient continued to have bright red blood per rectum with dark clots. He required transfer to the ICU for closer monitoring.  Stat bleeding scan did not reveal any clear source of bleeding. Surgical consultation was obtained, [name] followed the patient throughout the rest of his hospitalization. He required 4 units of packed red blood cells to be transfused. Fortunately the bleeding stopped and no surgery was required.  At the time of discharge there has been no bleeding and no bowel movements for over 2 days. His diet has been advanced and he is tolerating solid food. His abdominal pain has completely resolved  Gout  Prior to admission the patient had a gout attack in his thumb. He took  indomethacin. His thumb slowly improved without further treatment. On 09/29/12 he developed an acute gout attack in his  right elbow. the pain was so severe he was unable to move the elbow. He was given one dose of solumedrol IV 60 mg.  This morning he started a short course of  oral prednisone.  His right elbow has developed swelling, but  he has regained some movement in it and is less painful.  [name] understands that most of the medications that treat gout with either aggravate GI bleeding or aggravate his kidney disease.   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  Creatinine at baseline at 1.4. Per [name], Votrient  was held during this admission. I have left a voicemail message at [name] office to please call the patient and tell him when to resume Votrient chemotherapy.   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  Continue  Topamax   Consultants:  Gastroenterology, Surgery Procedures:  Colonoscopy, EGD on 09/25/2012  Discharge Exam: Filed Vitals:   09/29/12 1400 09/29/12 2053 09/30/12 0507 09/30/12 1359  BP: 132/79 118/67 127/60 123/63  Pulse: 104 76 47 75  Temp: 100.2 F (37.9 C) 97.7 F (36.5 C) 97.5 F (36.4 C) 97.9 F (36.6 C)  TempSrc: Oral Oral Oral   Resp: 18 20 20 16   Height:      Weight:      SpO2: 100% 99% 100% 99%    General: Alert and oriented, sitting up in bed, appears well  Cardiovascular: Regular rate and rhythm, no murmurs rubs or gallops  Respiratory: Clear consultation, no accessory muscle use  Abdomen: Thin, soft, nontender, nondistended, positive bowel sounds, no masses. Extremities: Right elbow is slightly edematous still with some erythema but improved over January 13. Improved but still limited range of motion.  Discharge Instructions      Discharge Orders    Future Appointments: Provider: Department: Dept Phone: Center:   10/14/2012 1:15 PM [name] [facility] MEDICAL ONCOLOGY [phone] None   10/14/2012 1:45 PM [name], NP [facility] MEDICAL ONCOLOGY [phone] None     Future Orders Please Complete By Expires   Diet general      Comments:   Low Purine Diet (the gout diet).   Increase activity slowly          Medication List     As of 09/30/2012  2:15 PM      STOP taking these medications         indomethacin 50 MG capsule   Commonly known as: INDOCIN      pazopanib 200 MG tablet   Commonly known as: VOTRIENT      TAKE these medications         oxyCODONE 5 MG immediate release tablet   Commonly known as: Oxy IR/ROXICODONE   Take 1 tablet (5 mg total) by mouth every 4 (four) hours as needed for pain (gout pain).      pantoprazole 40 MG tablet   Commonly known as: PROTONIX   Take 1 tablet (40 mg total) by mouth daily.      predniSONE 10 MG tablet   Commonly known as: DELTASONE   Take 4 tablets (40 mg total) by mouth daily with breakfast. Take 4 tablets on 1/15  Take 3 tablets on 1/16  Take 2 tablets on 1/17  Take 1 tablets on 1/18  Take 1/2 tablet on 1/19, and 1/20.  Then stop.      topiramate 25 MG tablet   Commonly known as: TOPAMAX   Take 25 mg by mouth 2 (two) times daily.        Follow-up Information    Follow up with [name], MD. On 10/17/2012. (2:15 am)    Contact information:   [address] [place] [address] [phone]       Follow up with REHMAN,NAJEEB U, MD. Schedule an appointment as soon as possible for a visit in 3 months.   Contact information:   [address], [address]  [place] [address] [phone]           The results of significant diagnostics from this hospitalization (including imaging, microbiology, ancillary and laboratory) are listed below for reference.    Significant Diagnostic Studies: Nm Gi Blood Loss  09/26/2012  *RADIOLOGY REPORT*  Clinical Data: Rectal bleeding.  Diverticular disease.  NUCLEAR MEDICINE GASTROINTESTINAL BLEEDING STUDY  Technique:  Sequential abdominal images were obtained following intravenous administration of Tc-75m labeled red blood cells.  Radiopharmaceutical: CURIE ULTRATAG TECHNETIUM TC 75M- LABELED RED BLOOD CELLS IV KIT  Comparison: CT abdomen 07/05/2012  Findings: There is no abnormal accumulation of tag red blood cells within  the bowel to suggest active gastrointestinal bleeding. Physiologic blood pool activity as well as splenic activity and genitourinary activity is noted.  IMPRESSION: No scintigraphic evidence of active gastrointestinal bleeding.   Original Report Authenticated By: Genevive Bi, M.D.     Microbiology: Recent Results (from the past 240 hour(s))  MRSA PCR SCREENING     Status: Normal   Collection Time   09/26/12  9:00 PM      Component Value Range Status Comment   MRSA by PCR NEGATIVE  NEGATIVE Final      Labs: Basic Metabolic Panel:  Lab 09/30/12 0102 09/27/12 0532 09/26/12 0211 09/25/12 0335  NA 135 136 137 140  K 4.0 3.7 4.0 3.5  CL 103 106 106 104  CO2 23 23 26 26   GLUCOSE 135* 96 102* 120*  BUN 14 12 12 23   CREATININE 1.40* 1.49* 1.64* 1.47*  CALCIUM 9.5 7.6* 8.3* 9.5  MG -- -- -- 1.9  PHOS -- -- -- --   Liver Function Tests:  Lab 09/25/12 0335  AST 69*  ALT 95*  ALKPHOS 95  BILITOT 0.5  PROT 7.4  ALBUMIN 3.3*   No results found for this basename: LIPASE:5,AMYLASE:5 in the last 168 hours No results found for this basename: AMMONIA:5 in the last 168 hours CBC:  Lab 09/30/12 0515 09/29/12 0710 09/28/12 1850 09/28/12 0712 09/27/12 1933 09/27/12 0703  WBC 8.7 7.5 5.6 4.3 3.7* --  NEUTROABS -- 6.5 4.4 3.4 2.8 3.2  HGB 10.3* 10.5* 9.8* 10.1* 8.3* --  HCT 29.7* 30.3* 28.2* 29.3* 23.7* --  MCV 92.5 93.2 95.3 93.6 92.6 --  PLT 283 249 257 211 175 --   Cardiac Enzymes: No results found for this basename: CKTOTAL:5,CKMB:5,CKMBINDEX:5,TROPONINI:5 in the last 168 hours BNP: BNP (last 3 results) No results found for this basename: PROBNP:3 in the last 8760 hours CBG:  Lab 09/26/12 1601  GLUCAP 97    Signed:  MEMON,JEHANZEB, PA-C  [phone]   [facility] 09/30/2012, 2:15 PM  Attending note:  Patient seen and examined, agree with note as above.

## 2012-09-30 NOTE — Progress Notes (Signed)
REVIEWED.  Pt SHOULD AVOID NSAID FOR RX OF GOUT.

## 2012-09-30 NOTE — Telephone Encounter (Signed)
lmoam for patient to call me 

## 2012-09-30 NOTE — Care Management Note (Signed)
    Page 1 of 1   09/30/2012     11:44:14 AM   CARE MANAGEMENT NOTE 09/30/2012  Patient:  Jeffrey Trujillo, Jeffrey Trujillo   Account Number:  [id number]  Date Initiated:  09/30/2012  Documentation initiated by:  Rosemary Holms  Subjective/Objective Assessment:   Pt admitted with rectal bleeding. From home with spouse. Plan to DC home. No HH/DME anticipated     Action/Plan:   Anticipated DC Date:  09/30/2012   Anticipated DC Plan:  HOME/SELF CARE      DC Planning Services  CM consult      Choice offered to / List presented to:             Status of service:  Completed, signed off Medicare Important Message given?   (If response is "NO", the following Medicare IM given date fields will be blank) Date Medicare IM given:   Date Additional Medicare IM given:    Discharge Disposition:  HOME/SELF CARE  Per UR Regulation:    If discussed at Long Length of Stay Meetings, dates discussed:   09/30/2012    Comments:  09/30/12 Rosemary Holms RN BNS CM

## 2012-10-02 ENCOUNTER — Telehealth: Payer: Self-pay | Admitting: Gastroenterology

## 2012-10-02 NOTE — Telephone Encounter (Signed)
Please make pt a routine OV for 3 mos hospital f/u with KJ or myself. Thanks!

## 2012-10-03 ENCOUNTER — Encounter: Payer: Self-pay | Admitting: Gastroenterology

## 2012-10-03 NOTE — Telephone Encounter (Signed)
Pt is aware of OV on 4/17 at 0930 with AS and appt card was mailed

## 2012-10-13 ENCOUNTER — Other Ambulatory Visit: Payer: Self-pay | Admitting: *Deleted

## 2012-10-13 NOTE — Telephone Encounter (Signed)
THIS REFILL REQUEST FOR VOTRIENT WAS PLACED IN DR.SHADAD'S ACTIVE WORK FOLDER. 

## 2012-10-14 ENCOUNTER — Other Ambulatory Visit (HOSPITAL_BASED_OUTPATIENT_CLINIC_OR_DEPARTMENT_OTHER): Payer: BC Managed Care – PPO

## 2012-10-14 ENCOUNTER — Other Ambulatory Visit: Payer: Self-pay | Admitting: *Deleted

## 2012-10-14 ENCOUNTER — Telehealth: Payer: Self-pay | Admitting: Oncology

## 2012-10-14 ENCOUNTER — Encounter: Payer: Self-pay | Admitting: Oncology

## 2012-10-14 ENCOUNTER — Ambulatory Visit (HOSPITAL_BASED_OUTPATIENT_CLINIC_OR_DEPARTMENT_OTHER): Payer: BC Managed Care – PPO | Admitting: Oncology

## 2012-10-14 VITALS — BP 136/81 | HR 58 | Temp 96.9°F | Resp 18 | Ht 71.0 in | Wt 176.3 lb

## 2012-10-14 DIAGNOSIS — C649 Malignant neoplasm of unspecified kidney, except renal pelvis: Secondary | ICD-10-CM

## 2012-10-14 DIAGNOSIS — C259 Malignant neoplasm of pancreas, unspecified: Secondary | ICD-10-CM

## 2012-10-14 DIAGNOSIS — D649 Anemia, unspecified: Secondary | ICD-10-CM

## 2012-10-14 LAB — CBC WITH DIFFERENTIAL/PLATELET
Basophils Absolute: 0 10*3/uL (ref 0.0–0.1)
EOS%: 0.1 % (ref 0.0–7.0)
HCT: 33 % — ABNORMAL LOW (ref 38.4–49.9)
HGB: 11.3 g/dL — ABNORMAL LOW (ref 13.0–17.1)
MCH: 32.1 pg (ref 27.2–33.4)
MCV: 93.4 fL (ref 79.3–98.0)
MONO%: 4.3 % (ref 0.0–14.0)
NEUT%: 90 % — ABNORMAL HIGH (ref 39.0–75.0)
Platelets: 181 10*3/uL (ref 140–400)
lymph#: 0.4 10*3/uL — ABNORMAL LOW (ref 0.9–3.3)

## 2012-10-14 LAB — COMPREHENSIVE METABOLIC PANEL (CC13)
AST: 12 U/L (ref 5–34)
BUN: 17.9 mg/dL (ref 7.0–26.0)
Calcium: 9.2 mg/dL (ref 8.4–10.4)
Chloride: 103 mEq/L (ref 98–107)
Creatinine: 1.3 mg/dL (ref 0.7–1.3)

## 2012-10-14 MED ORDER — PAZOPANIB HCL 200 MG PO TABS: 800.0000 mg | ORAL_TABLET | Freq: Every day | ORAL | Status: DC

## 2012-10-14 NOTE — Telephone Encounter (Signed)
lmonvm adviisng the pt of his march 2014 appts for the lab and the md.

## 2012-10-14 NOTE — Progress Notes (Signed)
Hematology and Oncology Follow Up Visit  Jeffrey Trujillo [id number] 1951/03/16 62 y.o. 10/14/2012 5:07 PM [name], MD[place]stock, Jeffrey Seller, MD   Principle Diagnosis: 63 year old with the following issues: 1. T1b right kidney cancer diagnosed in 2010. He is rdical nephrectomy on June 27, 2009. The pathological staging was T1b, was clear cell histology with Fuhrman grade 3/4.  2. Left Kidney mass  Measuring 4.5 x 2 7 x 3.6 irregular enhancing appears to be a new kidney cancer noted in 07/2012. 3. Pancreatic lesion. 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  Current therapy: Votrient  800 mg daily started in December 2013.  Interim History: Jeffrey Trujillo presents for a follow up visit. He is a nice man with the above issues. Since his last visit, he was hospitalized due to diverticulitis. He was given 4 units PRBC during his hospitalization. Votirient was stopped during the hospitalization, but he has resumed it now. Tolerating medication well. Denies chest pain, shortness of breath, abdominal pain, nausea, vomiting. No diarrhea. No rashes. He has not noticed any bleeding since hospital D/C. Appetite improving at this time. On Prednisone taper due to gout flare.   Medications: I have reviewed the patient's current medications. Current outpatient prescriptions:oxyCODONE (OXY IR/ROXICODONE) 5 MG immediate release tablet, Take 1 tablet (5 mg total) by mouth every 4 (four) hours as needed for pain (gout pain)., Disp: 20 tablet, Rfl: 0;  pantoprazole (PROTONIX) 40 MG tablet, Take 1 tablet (40 mg total) by mouth daily., Disp: 30 tablet, Rfl: 2;  pazopanib (VOTRIENT) 200 MG tablet, Take 4 tablets (800 mg total) by mouth daily. Take on an empty stomach., Disp: 120 tablet, Rfl: 0 predniSONE (DELTASONE) 10 MG tablet, Take 4 tablets (40 mg total) by mouth daily with breakfast. Take 4 tablets on 1/15 Take 3 tablets on 1/16 Take 2 tablets on 1/17 Take 1 tablets on 1/18 Take 1/2 tablet on 1/19,  and 1/20.  Then stop., Disp: 10 tablet, Rfl: 0;  topiramate (TOPAMAX) 25 MG tablet, Take 25 mg by mouth 2 (two) times daily., Disp: , Rfl:   Allergies:  Allergies  Allergen Reactions  . Nsaids Other (See Comments)    Patient only has one kidney.   . Meperidine Hcl Hives and Rash    Past Medical History, Surgical history, Social history, and Family History were reviewed and updated.  Review of Systems: Constitutional:  Negative for fever, chills, night sweats, anorexia, weight loss, pain. Cardiovascular: no chest pain or dyspnea on exertion Respiratory: negative Neurological: negative Dermatological: negative ENT: negative Skin: Negative. Gastrointestinal: negative Genito-Urinary: negative Hematological and Lymphatic: negative Breast: negative Musculoskeletal: negative Remaining ROS negative.  Physical Exam: Blood pressure 136/81, pulse 58, temperature 96.9 F (36.1 C), temperature source Oral, resp. rate 18, height 5\' 11"  (1.803 m), weight 176 lb 4.8 oz (79.969 kg). ECOG: 1 General appearance: alert Head: Normocephalic, without obvious abnormality, atraumatic Neck: no adenopathy, no carotid bruit, no JVD, supple, symmetrical, trachea midline and thyroid not enlarged, symmetric, no tenderness/mass/nodules Lymph nodes: Cervical, supraclavicular, and axillary nodes normal. Heart:regular rate and rhythm, S1, S2 normal, no murmur, click, rub or gallop Lung:chest clear, no wheezing, rales, normal symmetric air entry Abdomen: soft, non-tender, without masses or organomegaly EXT:no erythema, induration, or nodules   Lab Results: Lab Results  Component Value Date   WBC 7.9 10/14/2012   HGB 11.3* 10/14/2012   HCT 33.0* 10/14/2012   MCV 93.4 10/14/2012   PLT 181 10/14/2012     Chemistry  Component Value Date/Time   NA 138 10/14/2012 1240   NA 135 09/30/2012 0515   K 4.7 10/14/2012 1240   K 4.0 09/30/2012 0515   CL 103 10/14/2012 1240   CL 103 09/30/2012 0515   CO2 27  10/14/2012 1240   CO2 23 09/30/2012 0515   BUN 17.9 10/14/2012 1240   BUN 14 09/30/2012 0515   CREATININE 1.3 10/14/2012 1240   CREATININE 1.40* 09/30/2012 0515      Component Value Date/Time   CALCIUM 9.2 10/14/2012 1240   CALCIUM 9.5 09/30/2012 0515   ALKPHOS 69 10/14/2012 1240   ALKPHOS 95 09/25/2012 0335   AST 12 10/14/2012 1240   AST 69* 09/25/2012 0335   ALT 32 10/14/2012 1240   ALT 95* 09/25/2012 0335   BILITOT 0.81 10/14/2012 1240   BILITOT 0.5 09/25/2012 0335      Impression and Plan:   A 62 year old gentleman with the following issues:   1. A history of renal cell carcinoma. He had stage T1b diagnosed in October 2010 of the right kidney, status post right nephrectomy, now presents with a solid tumor of the left kidney.   2. Pancreatic lesion. Biopsy proven to be malignant and likely represent renal cell cancer. On Votirient at tolerating this well. Recommend that he continue Votrient without dose modification.   3. Gout. On Prednisone taper per PCP.  4. Anemia. Hgb is improving. No active bleeding. No transfusion is indicated.  5. Follow-up. In 4-5 weeks.   [place], [place] 1/28/20145:07 PM

## 2012-10-14 NOTE — Telephone Encounter (Signed)
Script for votrient faxed to [facility] [phone]

## 2012-10-28 NOTE — Telephone Encounter (Signed)
RECEIVED A FAX FROM [facility] CONCERNING A CONFIRMATION OF PRESCRIPTION SHIPMENT FOR VOTRIENT ON 10/27/12.

## 2012-11-20 ENCOUNTER — Other Ambulatory Visit: Payer: Self-pay | Admitting: *Deleted

## 2012-11-20 MED ORDER — PAZOPANIB HCL 200 MG PO TABS: 800.0000 mg | ORAL_TABLET | Freq: Every day | ORAL | Status: DC

## 2012-11-20 NOTE — Telephone Encounter (Signed)
THIS REFILL REQUEST WAS PLACED IN DR.SHADAD'S ACTIVE WORK FOLDER. 

## 2012-11-20 NOTE — Addendum Note (Signed)
Addended by: Arvilla Meres on: 11/20/2012 01:17 PM   Modules accepted: Orders

## 2012-11-25 ENCOUNTER — Telehealth: Payer: Self-pay | Admitting: *Deleted

## 2012-11-25 ENCOUNTER — Telehealth: Payer: Self-pay | Admitting: Oncology

## 2012-11-25 ENCOUNTER — Other Ambulatory Visit (HOSPITAL_BASED_OUTPATIENT_CLINIC_OR_DEPARTMENT_OTHER): Payer: BC Managed Care – PPO | Admitting: Lab

## 2012-11-25 ENCOUNTER — Ambulatory Visit (HOSPITAL_BASED_OUTPATIENT_CLINIC_OR_DEPARTMENT_OTHER): Payer: BC Managed Care – PPO | Admitting: Oncology

## 2012-11-25 VITALS — BP 143/92 | HR 64 | Temp 97.0°F | Resp 18 | Ht 71.0 in | Wt 172.9 lb

## 2012-11-25 LAB — CBC WITH DIFFERENTIAL/PLATELET
Basophils Absolute: 0 10*3/uL (ref 0.0–0.1)
EOS%: 1.2 % (ref 0.0–7.0)
Eosinophils Absolute: 0 10*3/uL (ref 0.0–0.5)
HCT: 37.5 % — ABNORMAL LOW (ref 38.4–49.9)
HGB: 12.9 g/dL — ABNORMAL LOW (ref 13.0–17.1)
MCH: 32.7 pg (ref 27.2–33.4)
MONO#: 0.5 10*3/uL (ref 0.1–0.9)
NEUT%: 67.8 % (ref 39.0–75.0)
lymph#: 0.7 10*3/uL — ABNORMAL LOW (ref 0.9–3.3)

## 2012-11-25 LAB — COMPREHENSIVE METABOLIC PANEL (CC13)
BUN: 14 mg/dL (ref 7.0–26.0)
CO2: 25 mEq/L (ref 22–29)
Calcium: 8.9 mg/dL (ref 8.4–10.4)
Chloride: 106 mEq/L (ref 98–107)
Creatinine: 1.6 mg/dL — ABNORMAL HIGH (ref 0.7–1.3)
Glucose: 108 mg/dl — ABNORMAL HIGH (ref 70–99)

## 2012-11-25 NOTE — Telephone Encounter (Signed)
Gave pt appt for April 2014 lab before Ct then MD also gave pt oral contrast

## 2012-11-25 NOTE — Progress Notes (Signed)
Liver function results indicates increase in AST, ALT and bilirubin.  We will hold Votrient for now. He will have repeat liver function test and CT scan before the next visit.

## 2012-11-25 NOTE — Progress Notes (Signed)
Hematology and Oncology Follow Up Visit  Jeffrey Trujillo [id number] 08/19/1951 62 y.o. 11/25/2012 10:04 AM [name], MD[place]stock, Jeffrey Seller, MD   Principle Diagnosis: 62 year old with the following issues: 1. T1b right kidney cancer diagnosed in 2010. He is rdical nephrectomy on June 27, 2009. The pathological staging was T1b, was clear cell histology with Fuhrman grade 3/4.  2. Left Kidney mass  Measuring 4.5 x 2 7 x 3.6 irregular enhancing appears to be a new kidney cancer noted in 07/2012. 3. Pancreatic lesion. 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  Current therapy: Votrient  800 mg daily started in December 2013.  Interim History: Jeffrey Trujillo presents for a follow up visit. He is a nice man with the above issues. Since his last visit, he has been doing better. He is doing well with Votirient at this time. It was stopped during the hospitalization, but he has resumed it now. Denies chest pain, shortness of breath, abdominal pain, nausea, vomiting. No diarrhea. No rashes. He has not noticed any bleeding since hospital D/C. On Prednisone taper due to gout flare. He is reporting dyspepsia at night time with that.    Medications: I have reviewed the patient's current medications. Current outpatient prescriptions:oxyCODONE (OXY IR/ROXICODONE) 5 MG immediate release tablet, Take 1 tablet (5 mg total) by mouth every 4 (four) hours as needed for pain (gout pain)., Disp: 20 tablet, Rfl: 0;  pantoprazole (PROTONIX) 40 MG tablet, Take 1 tablet (40 mg total) by mouth daily., Disp: 30 tablet, Rfl: 2;  pazopanib (VOTRIENT) 200 MG tablet, Take 4 tablets (800 mg total) by mouth daily. Take on an empty stomach., Disp: 120 tablet, Rfl: 1 predniSONE (DELTASONE) 10 MG tablet, Take 4 tablets (40 mg total) by mouth daily with breakfast. Take 4 tablets on 1/15 Take 3 tablets on 1/16 Take 2 tablets on 1/17 Take 1 tablets on 1/18 Take 1/2 tablet on 1/19, and 1/20.  Then stop., Disp: 10 tablet,  Rfl: 0;  topiramate (TOPAMAX) 25 MG tablet, Take 25 mg by mouth 2 (two) times daily., Disp: , Rfl:   Allergies:  Allergies  Allergen Reactions  . Nsaids Other (See Comments)    Patient only has one kidney.   . Meperidine Hcl Hives and Rash    Past Medical History, Surgical history, Social history, and Family History were reviewed and updated.  Review of Systems: Constitutional:  Negative for fever, chills, night sweats, anorexia, weight loss, pain. Cardiovascular: no chest pain or dyspnea on exertion Respiratory: negative Neurological: negative Dermatological: negative ENT: negative Skin: Negative. Gastrointestinal: negative Genito-Urinary: negative Hematological and Lymphatic: negative Breast: negative Musculoskeletal: negative Remaining ROS negative.  Physical Exam: Blood pressure 143/92, pulse 64, temperature 97 F (36.1 C), temperature source Oral, resp. rate 18, height 5\' 11"  (1.803 m), weight 172 lb 14.4 oz (78.427 kg). ECOG: 1 General appearance: alert Head: Normocephalic, without obvious abnormality, atraumatic Neck: no adenopathy, no carotid bruit, no JVD, supple, symmetrical, trachea midline and thyroid not enlarged, symmetric, no tenderness/mass/nodules Lymph nodes: Cervical, supraclavicular, and axillary nodes normal. Heart:regular rate and rhythm, S1, S2 normal, no murmur, click, rub or gallop Lung:chest clear, no wheezing, rales, normal symmetric air entry Abdomen: soft, non-tender, without masses or organomegaly EXT:no erythema, induration, or nodules   Lab Results: Lab Results  Component Value Date   WBC 3.8* 11/25/2012   HGB 12.9* 11/25/2012   HCT 37.5* 11/25/2012   MCV 95.0 11/25/2012   PLT 107* 11/25/2012     Chemistry  Component Value Date/Time   NA 138 10/14/2012 1240   NA 135 09/30/2012 0515   K 4.7 10/14/2012 1240   K 4.0 09/30/2012 0515   CL 103 10/14/2012 1240   CL 103 09/30/2012 0515   CO2 27 10/14/2012 1240   CO2 23 09/30/2012 0515   BUN  17.9 10/14/2012 1240   BUN 14 09/30/2012 0515   CREATININE 1.3 10/14/2012 1240   CREATININE 1.40* 09/30/2012 0515      Component Value Date/Time   CALCIUM 9.2 10/14/2012 1240   CALCIUM 9.5 09/30/2012 0515   ALKPHOS 69 10/14/2012 1240   ALKPHOS 95 09/25/2012 0335   AST 12 10/14/2012 1240   AST 69* 09/25/2012 0335   ALT 32 10/14/2012 1240   ALT 95* 09/25/2012 0335   BILITOT 0.81 10/14/2012 1240   BILITOT 0.5 09/25/2012 0335      Impression and Plan:   A 62 year old gentleman with the following issues:   1. A history of renal cell carcinoma. He had stage T1b diagnosed in October 2010 of the right kidney, status post right nephrectomy, now presents with a solid tumor of the left kidney. He will get a CT scan before the next visit.   2. Pancreatic lesion. Biopsy proven to be malignant and likely represent renal cell cancer. On Votirient at tolerating this well. Recommend that he continue Votrient without dose modification.   3. Gout. On Prednisone taper per PCP.  4. Anemia. Hgb is improving. No active bleeding. No transfusion is indicated.  5. GERD/ dyspepsia: likely due to steroids. I advised him to use his PPI bid, avoid afternoon steroids, and avoid eating before laying down at night.   Jeffrey Trujillo 3/11/201410:04 AM

## 2012-11-25 NOTE — Telephone Encounter (Signed)
Message copied by Reesa Chew on Tue Nov 25, 2012 12:53 PM ------      Message from: Benjiman Core      Created: Tue Nov 25, 2012 12:33 PM       Please call the patient and tell him stop the Votrient. Liver function is elevated.       He will have repeat labs next month and needs to hold the medicine till then. ------

## 2012-11-25 NOTE — Telephone Encounter (Signed)
Spoke with patient's wife, patient has not gotten home yet. Per dr Clelia Croft, patient is to hold the votrient until he sees dr Clelia Croft next month and we repeat lab work. Wife verbalizes understanding.

## 2012-12-01 ENCOUNTER — Telehealth: Payer: Self-pay | Admitting: Dietician

## 2012-12-01 NOTE — Telephone Encounter (Signed)
Brief Outpatient Oncology Nutrition Note  Patient has been identified to be at risk on malnutrition screen.   Wt Readings from Last 10 Encounters:  11/25/12 172 lb 14.4 oz (78.427 kg)  10/14/12 176 lb 4.8 oz (79.969 kg)  09/29/12 169 lb 1.5 oz (76.7 kg)  09/29/12 169 lb 1.5 oz (76.7 kg)  09/05/12 183 lb 14.4 oz (83.416 kg)  08/07/12 178 lb (80.74 kg)  08/07/12 178 lb (80.74 kg)  08/05/12 182 lb 12.8 oz (82.918 kg)  08/04/12 178 lb 12.8 oz (81.103 kg)  03/12/12 186 lb (84.369 kg)    Called and spoke with patient's daughter.  (Patient has let daughter borrow their phone.)  Daughter reports that patient is eating well and she is unaware of any issues at this time.  Daughter to give them the message of this call and Patient to call if any questions.  Oran Rein, RD, LDN

## 2012-12-24 ENCOUNTER — Telehealth: Payer: Self-pay | Admitting: Oncology

## 2012-12-24 NOTE — Telephone Encounter (Signed)
Called p[t left message regarding appt for 4/16 @ 10:30 am MD only

## 2012-12-29 ENCOUNTER — Encounter (HOSPITAL_COMMUNITY): Payer: Self-pay

## 2012-12-29 ENCOUNTER — Ambulatory Visit (HOSPITAL_COMMUNITY)
Admission: RE | Admit: 2012-12-29 | Discharge: 2012-12-29 | Disposition: A | Discharge status: Home / Self Care | Payer: BC Managed Care – PPO | Source: Ambulatory Visit | Attending: Oncology | Admitting: Oncology

## 2012-12-29 ENCOUNTER — Other Ambulatory Visit: Payer: Self-pay | Admitting: Oncology

## 2012-12-29 ENCOUNTER — Other Ambulatory Visit (HOSPITAL_BASED_OUTPATIENT_CLINIC_OR_DEPARTMENT_OTHER): Payer: BC Managed Care – PPO | Admitting: Lab

## 2012-12-29 DIAGNOSIS — Z79899 Other long term (current) drug therapy: Secondary | ICD-10-CM | POA: Insufficient documentation

## 2012-12-29 DIAGNOSIS — C649 Malignant neoplasm of unspecified kidney, except renal pelvis: Secondary | ICD-10-CM

## 2012-12-29 DIAGNOSIS — C259 Malignant neoplasm of pancreas, unspecified: Secondary | ICD-10-CM

## 2012-12-29 DIAGNOSIS — C7900 Secondary malignant neoplasm of unspecified kidney and renal pelvis: Secondary | ICD-10-CM | POA: Insufficient documentation

## 2012-12-29 DIAGNOSIS — Z905 Acquired absence of kidney: Secondary | ICD-10-CM | POA: Insufficient documentation

## 2012-12-29 DIAGNOSIS — K869 Disease of pancreas, unspecified: Secondary | ICD-10-CM | POA: Insufficient documentation

## 2012-12-29 DIAGNOSIS — C50919 Malignant neoplasm of unspecified site of unspecified female breast: Secondary | ICD-10-CM | POA: Insufficient documentation

## 2012-12-29 DIAGNOSIS — C7970 Secondary malignant neoplasm of unspecified adrenal gland: Secondary | ICD-10-CM | POA: Insufficient documentation

## 2012-12-29 DIAGNOSIS — N289 Disorder of kidney and ureter, unspecified: Secondary | ICD-10-CM | POA: Insufficient documentation

## 2012-12-29 DIAGNOSIS — K7689 Other specified diseases of liver: Secondary | ICD-10-CM | POA: Insufficient documentation

## 2012-12-29 DIAGNOSIS — R599 Enlarged lymph nodes, unspecified: Secondary | ICD-10-CM | POA: Insufficient documentation

## 2012-12-29 LAB — COMPREHENSIVE METABOLIC PANEL (CC13)
ALT: 14 U/L (ref 0–55)
Albumin: 3.4 g/dL — ABNORMAL LOW (ref 3.5–5.0)
BUN: 16.9 mg/dL (ref 7.0–26.0)
CO2: 26 mEq/L (ref 22–29)
Calcium: 9.4 mg/dL (ref 8.4–10.4)
Chloride: 105 mEq/L (ref 98–107)
Creatinine: 1.4 mg/dL — ABNORMAL HIGH (ref 0.7–1.3)
Potassium: 4.4 mEq/L (ref 3.5–5.1)

## 2012-12-29 LAB — CBC WITH DIFFERENTIAL/PLATELET
BASO%: 0.2 % (ref 0.0–2.0)
Basophils Absolute: 0 10*3/uL (ref 0.0–0.1)
HCT: 36.3 % — ABNORMAL LOW (ref 38.4–49.9)
HGB: 12.2 g/dL — ABNORMAL LOW (ref 13.0–17.1)
MONO#: 0.5 10*3/uL (ref 0.1–0.9)
NEUT#: 5.1 10*3/uL (ref 1.5–6.5)
NEUT%: 77.2 % — ABNORMAL HIGH (ref 39.0–75.0)
WBC: 6.6 10*3/uL (ref 4.0–10.3)
lymph#: 0.9 10*3/uL (ref 0.9–3.3)

## 2012-12-29 MED ORDER — IOHEXOL 300 MG/ML  SOLN
100.0000 mL | Freq: Once | INTRAMUSCULAR | Status: AC | PRN
  Administered 2012-12-29: 100 mL via INTRAVENOUS

## 2012-12-30 ENCOUNTER — Encounter: Payer: Self-pay | Admitting: Internal Medicine

## 2012-12-31 ENCOUNTER — Ambulatory Visit: Payer: BC Managed Care – PPO | Admitting: Oncology

## 2012-12-31 ENCOUNTER — Telehealth: Payer: Self-pay | Admitting: Oncology

## 2012-12-31 ENCOUNTER — Ambulatory Visit (HOSPITAL_BASED_OUTPATIENT_CLINIC_OR_DEPARTMENT_OTHER): Payer: BC Managed Care – PPO | Admitting: Oncology

## 2012-12-31 VITALS — BP 117/70 | HR 74 | Temp 97.7°F | Resp 20 | Ht 71.0 in | Wt 179.4 lb

## 2012-12-31 DIAGNOSIS — C649 Malignant neoplasm of unspecified kidney, except renal pelvis: Secondary | ICD-10-CM

## 2012-12-31 DIAGNOSIS — K859 Acute pancreatitis without necrosis or infection, unspecified: Secondary | ICD-10-CM

## 2012-12-31 DIAGNOSIS — M109 Gout, unspecified: Secondary | ICD-10-CM

## 2012-12-31 DIAGNOSIS — C641 Malignant neoplasm of right kidney, except renal pelvis: Secondary | ICD-10-CM

## 2012-12-31 MED ORDER — PAZOPANIB HCL 200 MG PO TABS: ORAL_TABLET | ORAL | Status: DC

## 2012-12-31 NOTE — Progress Notes (Signed)
Hematology and Oncology Follow Up Visit  Jeffrey Trujillo [id number] 1951/02/14 62 y.o. 11/25/2012 10:04 AM [name], MD[place], Sharon Seller, MD   Principle Diagnosis: 62 year old with the following issues: 1. T1b right kidney cancer diagnosed in 2010. He is rdical nephrectomy on June 27, 2009. The pathological staging was T1b, was clear cell histology with Fuhrman grade 3/4.  2. Left Kidney mass  Measuring 4.5 x 2 7 x 3.6 irregular enhancing appears to be a new kidney cancer noted in 07/2012. 3. Pancreatic lesion. 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  Current therapy: Votrient  800 mg daily started in December 2013. Treatment held in 11/2012 due to increase LFTs.  Interim History: Jeffrey Trujillo presents for a follow up visit. He is a nice man with the above issues. Since his last visit, he has been doing better. He is doing well and has not been taking Votirient at this time. It was stopped due to elevation in his liver function test. Denies chest pain, shortness of breath, abdominal pain, nausea, vomiting. No diarrhea. No rashes. He has not noticed any bleeding since hospital D/C. On Prednisone taper due to gout flare. He is reporting dyspepsia at night time with that.    Medications: I have reviewed the patient's current medications. Current outpatient prescriptions:oxyCODONE (OXY IR/ROXICODONE) 5 MG immediate release tablet, Take 1 tablet (5 mg total) by mouth every 4 (four) hours as needed for pain (gout pain)., Disp: 20 tablet, Rfl: 0;  pantoprazole (PROTONIX) 40 MG tablet, Take 1 tablet (40 mg total) by mouth daily., Disp: 30 tablet, Rfl: 2;  pazopanib (VOTRIENT) 200 MG tablet, Take 4 tablets (800 mg total) by mouth daily. Take on an empty stomach., Disp: 120 tablet, Rfl: 1 predniSONE (DELTASONE) 10 MG tablet, Take 4 tablets (40 mg total) by mouth daily with breakfast. Take 4 tablets on 1/15 Take 3 tablets on 1/16 Take 2 tablets on 1/17 Take 1 tablets on 1/18 Take 1/2  tablet on 1/19, and 1/20.  Then stop., Disp: 10 tablet, Rfl: 0;  topiramate (TOPAMAX) 25 MG tablet, Take 25 mg by mouth 2 (two) times daily., Disp: , Rfl:   Allergies:  Allergies  Allergen Reactions  . Nsaids Other (See Comments)    Patient only has one kidney.   . Meperidine Hcl Hives and Rash    Past Medical History, Surgical history, Social history, and Family History were reviewed and updated.  Review of Systems: Constitutional:  Negative for fever, chills, night sweats, anorexia, weight loss, pain. Cardiovascular: no chest pain or dyspnea on exertion Respiratory: negative Neurological: negative Dermatological: negative ENT: negative Skin: Negative. Gastrointestinal: negative Genito-Urinary: negative Hematological and Lymphatic: negative Breast: negative Musculoskeletal: negative Remaining ROS negative.  Physical Exam: Blood pressure 143/92, pulse 64, temperature 97 F (36.1 C), temperature source Oral, resp. rate 18, height 5\' 11"  (1.803 m), weight 172 lb 14.4 oz (78.427 kg). ECOG: 1 General appearance: alert Head: Normocephalic, without obvious abnormality, atraumatic Neck: no adenopathy, no carotid bruit, no JVD, supple, symmetrical, trachea midline and thyroid not enlarged, symmetric, no tenderness/mass/nodules Lymph nodes: Cervical, supraclavicular, and axillary nodes normal. Heart:regular rate and rhythm, S1, S2 normal, no murmur, click, rub or gallop Lung:chest clear, no wheezing, rales, normal symmetric air entry Abdomen: soft, non-tender, without masses or organomegaly EXT:no erythema, induration, or nodules  CBC    Component Value Date/Time   WBC 6.6 12/29/2012 1105   WBC 8.7 09/30/2012 0515   RBC 3.87* 12/29/2012 1105   RBC 3.21*  09/30/2012 0515   HGB 12.2* 12/29/2012 1105   HGB 10.3* 09/30/2012 0515   HCT 36.3* 12/29/2012 1105   HCT 29.7* 09/30/2012 0515   PLT 176 12/29/2012 1105   PLT 283 09/30/2012 0515   MCV 93.9 12/29/2012 1105   MCV 92.5 09/30/2012 0515    MCH 31.6 12/29/2012 1105   MCH 32.1 09/30/2012 0515   MCHC 33.6 12/29/2012 1105   MCHC 34.7 09/30/2012 0515   RDW 15.4* 12/29/2012 1105   RDW 14.6 09/30/2012 0515   LYMPHSABS 0.9 12/29/2012 1105   LYMPHSABS 0.5* 09/29/2012 0710   MONOABS 0.5 12/29/2012 1105   MONOABS 0.5 09/29/2012 0710   EOSABS 0.1 12/29/2012 1105   EOSABS 0.1 09/29/2012 0710   BASOSABS 0.0 12/29/2012 1105   BASOSABS 0.0 09/29/2012 0710     Chemistry      Component Value Date/Time   NA 140 12/29/2012 1105   NA 135 09/30/2012 0515   K 4.4 12/29/2012 1105   K 4.0 09/30/2012 0515   CL 105 12/29/2012 1105   CL 103 09/30/2012 0515   CO2 26 12/29/2012 1105   CO2 23 09/30/2012 0515   BUN 16.9 12/29/2012 1105   BUN 14 09/30/2012 0515   CREATININE 1.4* 12/29/2012 1105   CREATININE 1.40* 09/30/2012 0515      Component Value Date/Time   CALCIUM 9.4 12/29/2012 1105   CALCIUM 9.5 09/30/2012 0515   ALKPHOS 98 12/29/2012 1105   ALKPHOS 95 09/25/2012 0335   AST 21 12/29/2012 1105   AST 69* 09/25/2012 0335   ALT 14 12/29/2012 1105   ALT 95* 09/25/2012 0335   BILITOT 1.34* 12/29/2012 1105   BILITOT 0.5 09/25/2012 0335      CT CHEST AND ABDOMEN WITH CONTRAST ABDOMENT WITHOUT CONTRAST  Technique: Contiguous axial images of the chest and abdomen were  obtained after IV contrast administration. Unenhanced images of  the abdomen were also obtained.  Contrast: 100 ml Omnipaque-300  Comparison: MRI 07/18/2012. Abdominal CT 06/24/2012. Most recent  chest CT 06/27/2010.  CT CHEST  Findings: Lung windows demonstrate volume loss in the left lung,  consistent with prior partial resection. No pulmonary nodule or  mass.  Soft tissue windows demonstrate no supraclavicular adenopathy.  Normal heart size without pericardial or pleural effusion. No  central pulmonary embolism, on this non-dedicated study. No  mediastinal or hilar adenopathy. Para esophageal node measures 6  mm on image 70/series 4 versus 4 mm on the prior. This is not  pathologic by size  criteria.  IMPRESSION:  1. No acute process or evidence of metastatic disease in the chest.  2. Para esophageal node which is slightly enlarged since the prior  but not pathologic by size criteria. This can be reevaluated at  follow-up.  CT ABDOMEN  Findings: No left renal calculi other calcified lesions on  unenhanced imaging.  Arterial phase images demonstrate no hypervascular lesions within  the liver.  Mild hepatic steatosis. A too small to characterize hepatic dome  lesion which is present on the prior MRI and is favored to  represent a small cyst.  Normal spleen, stomach.  Multiple hypervascular foci within the pancreas. Pancreatic body 8  mm lesion on image 41/series 3 is not readily apparent on the prior  MRI. Lesion within the periphery of the pancreatic head on image  56/series 8 measures 1.1 cm and is not readily apparent on the  prior.  The previously described lesion within the pancreatico/duodenal  groove measures 2.4 x 2.0 cm on image  61/series 3 versus similar on  the prior MRI (when remeasured). No pancreatic ductal dilatation.  Normal gallbladder, without biliary ductal dilatation. Normal  right adrenal gland. Hypervascular left adrenal nodule measures  1.3 cm and is unchanged.  Hyperenhancing central left renal lesion measures 3.1 x 2.5 cm  image 46/series 3 versus 3.1 x 2.7 cm at the same level on the  prior MRI (when remeasured). Contiguous or adjacent more superior  and lateral hyperenhancing focus in the upper pole left kidney  measures 3.2 x 2.8 cm on image 42/series 3 versus 3.2 x 3.0 cm at  the same level on the prior MRI (when remeasured).  There also too small to characterize lesions in the left kidney.  Minimal nonocclusive thrombus in the left renal plugging cannot be  excluded on image 52/series 3. No retroperitoneal or retrocrural  adenopathy.  Normal abdominal bowel loops, without ascites.  A metastasis within the right paraspinous musculature  measures 7 mm  on image 65/series 3. This is unchanged.  No acute osseous abnormality.  IMPRESSION:  1. Similar left renal, left adrenal, and right paraspinous  muscular metastasis.  2. Similar dominant pancreatic metastasis. New smaller pancreatic  lesions, consistent with mild progression of disease.  3. Mild hepatic steatosis.     Impression and Plan:   A 61 year old gentleman with the following issues:   1. A history of renal cell carcinoma. He had stage T1b diagnosed in October 2010 of the right kidney, status post right nephrectomy, now presents with a solid tumor of the left kidney. CT scan was reviewed and showed stable disease.  I discussed the options at this point with Jeffrey Trujillo. We can restart Votrient at total of 400 mg (50% dose reduction) or switch to Sutent. We elected to try the dose reduction and recheck his counts in 4 weeks.   2. Pancreatic lesion. Biopsy proven to be malignant and likely represent renal cell cancer.   3. Gout. On Prednisone taper per PCP.  4. Anemia. Hgb is improving. No active bleeding. No transfusion is indicated.  5. Increase LFTs: back to normal now. We will check again after restating Votrient at 50% of he dose.    SHADAD,[name] 3/11/201410:04 AM

## 2012-12-31 NOTE — Telephone Encounter (Signed)
gv and printed appt sched and avs for pt  °

## 2013-01-01 ENCOUNTER — Ambulatory Visit: Payer: BC Managed Care – PPO | Admitting: Gastroenterology

## 2013-01-01 ENCOUNTER — Encounter: Payer: Self-pay | Admitting: Gastroenterology

## 2013-01-01 ENCOUNTER — Ambulatory Visit (INDEPENDENT_AMBULATORY_CARE_PROVIDER_SITE_OTHER): Payer: BC Managed Care – PPO | Admitting: Gastroenterology

## 2013-01-01 VITALS — BP 123/67 | HR 76 | Temp 97.6°F | Ht 71.0 in | Wt 180.4 lb

## 2013-01-01 DIAGNOSIS — K922 Gastrointestinal hemorrhage, unspecified: Secondary | ICD-10-CM

## 2013-01-01 DIAGNOSIS — K2270 Barrett's esophagus without dysplasia: Secondary | ICD-10-CM

## 2013-01-01 NOTE — Patient Instructions (Addendum)
I will be talking with [name] about when we need to do the next upper endoscopy.   Take Protonix each day, indefinitely.

## 2013-01-01 NOTE — Progress Notes (Signed)
Referring Provider: [name], MD Primary Care Physician:  [name], MD Primary GI: [name]   Chief Complaint  Patient presents with  . Follow-up    HPI:   Jeffrey Trujillo is a pleasant 62 year old male presenting today in hospital follow-up after admission for lower GI bleed, diverticular in origin, requiring colonoscopy with clips X 3. EGD performed same admission with antral erosions and Barrett's. No biopsy performed at time of EGD. Most recent Hgb 12.2, overall stable.  No constipation, diarrhea, rectal bleeding. Hadn't been taking Protonix because he wasn't have GERD. Restarting Votrient tomorrow. Has cut down dosage.   Past Medical History  Diagnosis Date  . Gout   . DJD (degenerative joint disease) of cervical spine   . Diverticulosis of colon     by colonoscopy 11/02/2009  . Barrett's esophagus     by EGD 11/02/2009  . Cancer     "was told has cancer on other kidney and spot on pancreas"  . Renal cell cancer     with mets to pancreas    Past Surgical History  Procedure Laterality Date  . Nephrectomy      right due to cancer  . Lung removal, partial      left lower lobe, benign  . Eus  08/07/2012    Procedure: UPPER ENDOSCOPIC ULTRASOUND (EUS) LINEAR;  Surgeon: Rachael Fee, MD;  Location: [facility];  Service: Endoscopy;  Laterality: N/A;  . Inguinal hernia repair    . Hernia repair    . Inguinal hernia repair      Right  . Eus  08/21/2012    Procedure: UPPER ENDOSCOPIC ULTRASOUND (EUS) LINEAR;  Surgeon: Rachael Fee, MD;  Location: [facility];  Service: Endoscopy;  Laterality: N/A;  . Colonoscopy  Feb 2011    [name]: single anal papilla, pan-colonic diverticula  . Esophagogastroduodenoscopy  Feb 2011    [name]: salmon-colored epithelium consistent with Barrett's, nodular antral erosions, small hiatal hernia, negative H.pylori  . Colonoscopy  09/25/2012    RMR: Colonic diverticulosis. Suspect diverticular bleeding-hepatic flexure  tic-sealed  . Esophagogastroduodenoscopy  09/25/2012    ZOX:[id number]'[id number] esophagus. Small hiatal hernia. Antral erosions of doubtful clinical significance    Current Outpatient Prescriptions  Medication Sig Dispense Refill  . pantoprazole (PROTONIX) 40 MG tablet Take 1 tablet (40 mg total) by mouth daily.  30 tablet  2  . pazopanib (VOTRIENT) 200 MG tablet Take 200 mg by mouth daily. Take two tablets on an empty stomach.      . topiramate (TOPAMAX) 25 MG tablet Take 25 mg by mouth 2 (two) times daily.       No current facility-administered medications for this visit.    Allergies as of 01/01/2013 - Review Complete 01/01/2013  Allergen Reaction Noted  . Nsaids Other (See Comments) 06/05/2011  . Meperidine hcl Hives and Rash     Family History  Problem Relation Age of Onset  . Lung cancer Brother   . Prostate cancer Brother   . Colon cancer Neg Hx   . Lumbar disc disease Brother   . Prostate cancer Brother     History   Social History  . Marital Status: Married    Spouse Name: N/A    Number of Children: N/A  . Years of Education: N/A   Occupational History  .      textiles in [place]    Social History Main Topics  . Smoking status: Current Some Day Smoker    Types: Cigars  .  Smokeless tobacco: Never Used     Comment: occasional cigar  . Alcohol Use: Yes     Comment: vodka and budweiser, sometimes daily, depends on notion. couple drinks  . Drug Use: No  . Sexually Active: Yes   Other Topics Concern  . None   Social History Narrative  . None    Review of Systems: Negative unless mentioned in HPI.   Physical Exam: BP 123/67  Pulse 76  Temp(Src) 97.6 F (36.4 C) (Oral)  Ht 5\' 11"  (1.803 m)  Wt 180 lb 6.4 oz (81.829 kg)  BMI 25.17 kg/m2 General:   Alert and oriented. No distress noted. Pleasant and cooperative.  Head:  Normocephalic and atraumatic. Eyes:  Conjuctiva clear without scleral icterus. Heart:  S1, S2 present without murmurs, rubs, or gallops.  Regular rate and rhythm. Abdomen:  +BS, soft, non-tender and non-distended. No rebound or guarding. No HSM or masses noted. Msk:  Symmetrical without gross deformities. Normal posture. Extremities:  Without edema. Neurologic:  Alert and  oriented x4;  grossly normal neurologically. Skin:  Intact without significant lesions or rashes. Psych:  Alert and cooperative. Normal mood and affect.  Lab Results  Component Value Date   WBC 6.6 12/29/2012   HGB 12.2* 12/29/2012   HCT 36.3* 12/29/2012   MCV 93.9 12/29/2012   PLT 176 12/29/2012

## 2013-01-04 ENCOUNTER — Encounter: Payer: Self-pay | Admitting: Gastroenterology

## 2013-01-04 DIAGNOSIS — K2270 Barrett's esophagus without dysplasia: Secondary | ICD-10-CM | POA: Insufficient documentation

## 2013-01-04 NOTE — Assessment & Plan Note (Signed)
Jan 2014: diverticular bleed. Stable, doing well without further signs of rectal bleeding s/p TCS Jan 2014 with clips X 3. CBC stable.

## 2013-01-04 NOTE — Assessment & Plan Note (Signed)
Last biopsy 2011, with EGD during admission Jan 2014 showing known Barrett's. No biopsy performed. Will need to discuss timing  with [name] for a repeat EGD for biopsies. Continue PPI indefinitely.

## 2013-01-05 NOTE — Progress Notes (Signed)
Cc PCP 

## 2013-01-27 ENCOUNTER — Other Ambulatory Visit (HOSPITAL_BASED_OUTPATIENT_CLINIC_OR_DEPARTMENT_OTHER): Payer: BC Managed Care – PPO | Admitting: Lab

## 2013-01-27 ENCOUNTER — Ambulatory Visit (HOSPITAL_BASED_OUTPATIENT_CLINIC_OR_DEPARTMENT_OTHER): Payer: BC Managed Care – PPO | Admitting: Oncology

## 2013-01-27 ENCOUNTER — Telehealth: Payer: Self-pay | Admitting: Oncology

## 2013-01-27 ENCOUNTER — Encounter: Payer: Self-pay | Admitting: Oncology

## 2013-01-27 VITALS — BP 122/72 | HR 56 | Temp 97.5°F | Resp 18 | Ht 71.0 in | Wt 181.3 lb

## 2013-01-27 DIAGNOSIS — C649 Malignant neoplasm of unspecified kidney, except renal pelvis: Secondary | ICD-10-CM

## 2013-01-27 LAB — CBC WITH DIFFERENTIAL/PLATELET
BASO%: 0.2 % (ref 0.0–2.0)
EOS%: 0.3 % (ref 0.0–7.0)
HCT: 32.2 % — ABNORMAL LOW (ref 38.4–49.9)
MCH: 33 pg (ref 27.2–33.4)
MCHC: 34.8 g/dL (ref 32.0–36.0)
NEUT%: 79 % — ABNORMAL HIGH (ref 39.0–75.0)
lymph#: 0.7 10*3/uL — ABNORMAL LOW (ref 0.9–3.3)

## 2013-01-27 LAB — COMPREHENSIVE METABOLIC PANEL (CC13)
ALT: 32 U/L (ref 0–55)
AST: 16 U/L (ref 5–34)
Calcium: 8.7 mg/dL (ref 8.4–10.4)
Chloride: 108 mEq/L — ABNORMAL HIGH (ref 98–107)
Creatinine: 1.4 mg/dL — ABNORMAL HIGH (ref 0.7–1.3)
Total Bilirubin: 0.67 mg/dL (ref 0.20–1.20)

## 2013-01-27 NOTE — Progress Notes (Signed)
Hematology and Oncology Follow Up Visit  Jeffrey Trujillo [id number] 1951-08-28 62 y.o. 11/25/2012 10:04 AM Jeffrey Trujillo, MDComstock, Sharon Seller, MD   Principle Diagnosis: 62 year old with the following issues: 1. T1b right kidney cancer diagnosed in 2010. He is rdical nephrectomy on June 27, 2009. The pathological staging was T1b, was clear cell histology with Fuhrman grade 3/4.  2. Left Kidney mass  Measuring 4.5 x 2 7 x 3.6 irregular enhancing appears to be a new kidney cancer noted in 07/2012. 3. Pancreatic lesion. 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  Current therapy: Votrient  800 mg daily started in December 2013. Treatment held in 11/2012 due to increase LFTs. Votrient was resumed in April 2014 at 400 mg daily.  Interim History: Jeffrey Trujillo presents for a follow up visit. He is a nice man with the above issues. Since his last visit, he has been doing better. He is doing well with the Votrient. Denies chest pain, shortness of breath, abdominal pain, nausea, vomiting. No diarrhea. No rashes. He has not noticed any bleeding. He has had another flare of his gout. On Prednisone taper due to gout flare.   Medications: I have reviewed the patient's current medications. Current outpatient prescriptions:oxyCODONE (OXY IR/ROXICODONE) 5 MG immediate release tablet, Take 1 tablet (5 mg total) by mouth every 4 (four) hours as needed for pain (gout pain)., Disp: 20 tablet, Rfl: 0;  pantoprazole (PROTONIX) 40 MG tablet, Take 1 tablet (40 mg total) by mouth daily., Disp: 30 tablet, Rfl: 2;  pazopanib (VOTRIENT) 200 MG tablet, Take 4 tablets (800 mg total) by mouth daily. Take on an empty stomach., Disp: 120 tablet, Rfl: 1 predniSONE (DELTASONE) 10 MG tablet, Take 4 tablets (40 mg total) by mouth daily with breakfast. Take 4 tablets on 1/15 Take 3 tablets on 1/16 Take 2 tablets on 1/17 Take 1 tablets on 1/18 Take 1/2 tablet on 1/19, and 1/20.  Then stop., Disp: 10 tablet, Rfl: 0;   topiramate (TOPAMAX) 25 MG tablet, Take 25 mg by mouth 2 (two) times daily., Disp: , Rfl:   Allergies:  Allergies  Allergen Reactions  . Nsaids Other (See Comments)    Patient only has one kidney.   . Meperidine Hcl Hives and Rash    Past Medical History, Surgical history, Social history, and Family History were reviewed and updated.  Review of Systems: Constitutional:  Negative for fever, chills, night sweats, anorexia, weight loss, pain. Cardiovascular: no chest pain or dyspnea on exertion Respiratory: negative Neurological: negative Dermatological: negative ENT: negative Skin: Negative. Gastrointestinal: negative Genito-Urinary: negative Hematological and Lymphatic: negative Breast: negative Musculoskeletal: negative Remaining ROS negative.  Physical Exam: Blood pressure 143/92, pulse 64, temperature 97 F (36.1 C), temperature source Oral, resp. rate 18, height 5\' 11"  (1.803 m), weight 172 lb 14.4 oz (78.427 kg). ECOG: 1 General appearance: alert Head: Normocephalic, without obvious abnormality, atraumatic Neck: no adenopathy, no carotid bruit, no JVD, supple, symmetrical, trachea midline and thyroid not enlarged, symmetric, no tenderness/mass/nodules Lymph nodes: Cervical, supraclavicular, and axillary nodes normal. Heart:regular rate and rhythm, S1, S2 normal, no murmur, click, rub or gallop Lung:chest clear, no wheezing, rales, normal symmetric air entry Abdomen: soft, non-tender, without masses or organomegaly EXT:no erythema, induration, or nodules  CBC    Component Value Date/Time   WBC 5.0 01/27/2013 1417   WBC 8.7 09/30/2012 0515   RBC 3.39* 01/27/2013 1417   RBC 3.21* 09/30/2012 0515   HGB 11.2* 01/27/2013 1417   HGB 10.3*  09/30/2012 0515   HCT 32.2* 01/27/2013 1417   HCT 29.7* 09/30/2012 0515   PLT 144 01/27/2013 1417   PLT 283 09/30/2012 0515   MCV 94.8 01/27/2013 1417   MCV 92.5 09/30/2012 0515   MCH 33.0 01/27/2013 1417   MCH 32.1 09/30/2012 0515   MCHC  34.8 01/27/2013 1417   MCHC 34.7 09/30/2012 0515   RDW 16.5* 01/27/2013 1417   RDW 14.6 09/30/2012 0515   LYMPHSABS 0.7* 01/27/2013 1417   LYMPHSABS 0.5* 09/29/2012 0710   MONOABS 0.4 01/27/2013 1417   MONOABS 0.5 09/29/2012 0710   EOSABS 0.0 01/27/2013 1417   EOSABS 0.1 09/29/2012 0710   BASOSABS 0.0 01/27/2013 1417   BASOSABS 0.0 09/29/2012 0710     Chemistry      Component Value Date/Time   NA 143 01/27/2013 1417   NA 135 09/30/2012 0515   K 3.4* 01/27/2013 1417   K 4.0 09/30/2012 0515   CL 108* 01/27/2013 1417   CL 103 09/30/2012 0515   CO2 24 01/27/2013 1417   CO2 23 09/30/2012 0515   BUN 23.7 01/27/2013 1417   BUN 14 09/30/2012 0515   CREATININE 1.4* 01/27/2013 1417   CREATININE 1.40* 09/30/2012 0515      Component Value Date/Time   CALCIUM 8.7 01/27/2013 1417   CALCIUM 9.5 09/30/2012 0515   ALKPHOS 86 01/27/2013 1417   ALKPHOS 95 09/25/2012 0335   AST 16 01/27/2013 1417   AST 69* 09/25/2012 0335   ALT 32 01/27/2013 1417   ALT 95* 09/25/2012 0335   BILITOT 0.67 01/27/2013 1417   BILITOT 0.5 09/25/2012 0335      Impression and Plan:   A 62 year old gentleman with the following issues:   1. A history of renal cell carcinoma. He had stage T1b diagnosed in October 2010 of the right kidney, status post right nephrectomy, now presents with a solid tumor of the left kidney. CT scan from April 2014 showed stable disease. He is now back on Votrient at total of 400 mg (50% dose reduction) and doing well. Recommend that he continue Votrient without dose modification.  2. Pancreatic lesion. Biopsy proven to be malignant and likely represent renal cell cancer.   3. Gout. On Prednisone taper per PCP.  4. Anemia. Hgb is stable. No active bleeding. No transfusion is indicated.  5. Increase LFTs: back to normal now. Will continue to follow this.   6. Follow-up. In about 1 month.   Jeffrey Trujillo 3/11/201410:04 AM

## 2013-02-02 ENCOUNTER — Other Ambulatory Visit: Payer: Self-pay | Admitting: *Deleted

## 2013-02-02 NOTE — Telephone Encounter (Signed)
THIS REFILL REQUEST FOR VOTRIENT WAS PLACED IN DR.SHADAD'S ACTIVE WORK FOLDER. 

## 2013-02-03 ENCOUNTER — Other Ambulatory Visit: Payer: Self-pay | Admitting: *Deleted

## 2013-02-03 MED ORDER — PAZOPANIB HCL 200 MG PO TABS: 400.0000 mg | ORAL_TABLET | Freq: Every day | ORAL | Status: DC

## 2013-02-03 NOTE — Telephone Encounter (Signed)
RECEIVED A FAX FROM [facility] CONCERNING A CONFIRMATION OF FACSIMILE RECEIPT FOR PT. REFERRAL. 

## 2013-02-11 ENCOUNTER — Other Ambulatory Visit: Payer: Self-pay

## 2013-02-12 MED ORDER — PANTOPRAZOLE SODIUM 40 MG PO TBEC: 40.0000 mg | DELAYED_RELEASE_TABLET | Freq: Every day | ORAL | Status: DC

## 2013-02-12 NOTE — Telephone Encounter (Signed)
RECEIVED A FAX FROM [facility] CONCERNING A CONFIRMATION OF PRESCRIPTION SHIPMENT FOR VOTRIENT ON 02/11/13.

## 2013-03-05 ENCOUNTER — Telehealth: Payer: Self-pay | Admitting: Oncology

## 2013-03-05 ENCOUNTER — Ambulatory Visit (HOSPITAL_BASED_OUTPATIENT_CLINIC_OR_DEPARTMENT_OTHER): Payer: BC Managed Care – PPO | Admitting: Oncology

## 2013-03-05 ENCOUNTER — Other Ambulatory Visit (HOSPITAL_BASED_OUTPATIENT_CLINIC_OR_DEPARTMENT_OTHER): Payer: BC Managed Care – PPO | Admitting: Lab

## 2013-03-05 VITALS — BP 135/74 | HR 75 | Temp 98.1°F | Resp 18 | Ht 71.0 in | Wt 178.8 lb

## 2013-03-05 DIAGNOSIS — C649 Malignant neoplasm of unspecified kidney, except renal pelvis: Secondary | ICD-10-CM

## 2013-03-05 LAB — COMPREHENSIVE METABOLIC PANEL (CC13)
AST: 26 U/L (ref 5–34)
Alkaline Phosphatase: 87 U/L (ref 40–150)
BUN: 19.1 mg/dL (ref 7.0–26.0)
Calcium: 9.2 mg/dL (ref 8.4–10.4)
Creatinine: 1.4 mg/dL — ABNORMAL HIGH (ref 0.7–1.3)

## 2013-03-05 LAB — CBC WITH DIFFERENTIAL/PLATELET
BASO%: 0.3 % (ref 0.0–2.0)
EOS%: 1.4 % (ref 0.0–7.0)
HCT: 33.2 % — ABNORMAL LOW (ref 38.4–49.9)
LYMPH%: 12.6 % — ABNORMAL LOW (ref 14.0–49.0)
MCH: 34.6 pg — ABNORMAL HIGH (ref 27.2–33.4)
MCHC: 35.2 g/dL (ref 32.0–36.0)
MCV: 98.2 fL — ABNORMAL HIGH (ref 79.3–98.0)
MONO%: 11.2 % (ref 0.0–14.0)
NEUT%: 74.5 % (ref 39.0–75.0)
Platelets: 82 10*3/uL — ABNORMAL LOW (ref 140–400)

## 2013-03-05 NOTE — Telephone Encounter (Signed)
Gave pt appt for lab and MD for July 2014 , also gave pt oral contrast for CT

## 2013-03-05 NOTE — Progress Notes (Signed)
Hematology and Oncology Follow Up Visit  Jeffrey Trujillo [id number] December 12, 1950 62 y.o.    Principle Diagnosis: 62 year old with the following issues: 1. T1b right kidney cancer diagnosed in 2010. He is rdical nephrectomy on June 27, 2009. The pathological staging was T1b, was clear cell histology with Fuhrman grade 3/4.  2. Left Kidney mass  Measuring 4.5 x 2 7 x 3.6 irregular enhancing appears to be a new kidney cancer noted in 07/2012. 3. Pancreatic lesion. 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  Current therapy: Votrient  800 mg daily started in December 2013. Treatment held in 11/2012 due to increase LFTs. Votrient was resumed in April 2014 at 400 mg daily.  Interim History: Jeffrey Trujillo presents for a follow up visit. He is a nice man with the above issues. Since his last visit, he has been doing better. He is doing well with the Votrient. Denies chest pain, shortness of breath, abdominal pain, nausea, vomiting. No diarrhea. No rashes. He has not noticed any bleeding. He has had another flare of his gout. He reports no complications from his Votrient.   Medications:  Current Outpatient Prescriptions  Medication Sig Dispense Refill  . indomethacin (INDOCIN) 50 MG capsule Take 50 mg by mouth every 6 (six) hours as needed.      . pazopanib (VOTRIENT) 200 MG tablet Take 2 tablets (400 mg total) by mouth daily. Take two tablets on an empty stomach.  60 tablet  0  . topiramate (TOPAMAX) 25 MG tablet Take 25 mg by mouth 2 (two) times daily.       No current facility-administered medications for this visit.     Allergies:  Allergies  Allergen Reactions  . Nsaids Other (See Comments)    Patient only has one kidney.   . Meperidine Hcl Hives and Rash    Past Medical History, Surgical history, Social history, and Family History were reviewed and updated.  Review of Systems: Constitutional:  Negative for fever, chills, night sweats, anorexia, weight loss,  pain. Cardiovascular: no chest pain or dyspnea on exertion Respiratory: negative Neurological: negative Dermatological: negative ENT: negative Skin: Negative. Gastrointestinal: negative Genito-Urinary: negative Hematological and Lymphatic: negative Breast: negative Musculoskeletal: negative Remaining ROS negative.  Physical Exam: Blood pressure 143/92, pulse 64, temperature 97 F (36.1 C), temperature source Oral, resp. rate 18, height 5\' 11"  (1.803 m), weight 172 lb 14.4 oz (78.427 kg). ECOG: 1 General appearance: alert Head: Normocephalic, without obvious abnormality, atraumatic Neck: no adenopathy, no carotid bruit, no JVD, supple, symmetrical, trachea midline and thyroid not enlarged, symmetric, no tenderness/mass/nodules Lymph nodes: Cervical, supraclavicular, and axillary nodes normal. Heart:regular rate and rhythm, S1, S2 normal, no murmur, click, rub or gallop Lung:chest clear, no wheezing, rales, normal symmetric air entry Abdomen: soft, non-tender, without masses or organomegaly EXT:no erythema, induration, or nodules  CBC CBC    Component Value Date/Time   WBC 3.5* 03/05/2013 1410   WBC 8.7 09/30/2012 0515   RBC 3.38* 03/05/2013 1410   RBC 3.21* 09/30/2012 0515   HGB 11.7* 03/05/2013 1410   HGB 10.3* 09/30/2012 0515   HCT 33.2* 03/05/2013 1410   HCT 29.7* 09/30/2012 0515   PLT 82 confirmed both analyzers* 03/05/2013 1410   PLT 283 09/30/2012 0515   MCV 98.2* 03/05/2013 1410   MCV 92.5 09/30/2012 0515   MCH 34.6* 03/05/2013 1410   MCH 32.1 09/30/2012 0515   MCHC 35.2 03/05/2013 1410   MCHC 34.7 09/30/2012 0515   RDW 20.5* 03/05/2013  1410   RDW 14.6 09/30/2012 0515   LYMPHSABS 0.4* 03/05/2013 1410   LYMPHSABS 0.5* 09/29/2012 0710   MONOABS 0.4 03/05/2013 1410   MONOABS 0.5 09/29/2012 0710   EOSABS 0.1 03/05/2013 1410   EOSABS 0.1 09/29/2012 0710   BASOSABS 0.0 03/05/2013 1410   BASOSABS 0.0 09/29/2012 0710       Impression and Plan:   A 62 year old gentleman with the  following issues:   1. A history of renal cell carcinoma. He had stage T1b diagnosed in October 2010 of the right kidney, status post right nephrectomy, now presents with a solid tumor of the left kidney. CT scan from April 2014 showed stable disease. He is now back on Votrient at total of 400 mg (50% dose reduction) and doing well. Recommend that he continue Votrient without dose modification. I will repeat CT scan in 03/2013.   2. Pancreatic lesion. Biopsy proven to be malignant and likely represent renal cell cancer.   3. Gout. On Prednisone taper per PCP.  4. Anemia. Hgb is stable. No active bleeding. No transfusion is indicated.  5. Increase LFTs: back to normal now. Will continue to follow this.   6. Follow-up. In about 1 month.   Brannen Koppen 3/11/201410:04 AM

## 2013-03-06 ENCOUNTER — Telehealth: Payer: Self-pay | Admitting: *Deleted

## 2013-03-06 NOTE — Telephone Encounter (Signed)
Patient having diarrhea since yesterday, after eating cabbage and potatoes, bottom of stomach hurts and goes straight to the rectum. c/o chills, denies fever. Instructed to increase hydration, sport drinks. Chicken noodle soup later on in the afternoon. Is discomfort gets worse or diarrhea is not resolved, go to patient's family care clinic.

## 2013-03-09 ENCOUNTER — Telehealth: Payer: Self-pay | Admitting: *Deleted

## 2013-03-09 NOTE — Telephone Encounter (Signed)
[facility] faxed Votrient refill request.  Request to provider for review.

## 2013-03-10 ENCOUNTER — Other Ambulatory Visit: Payer: Self-pay | Admitting: Oncology

## 2013-03-10 ENCOUNTER — Emergency Department (HOSPITAL_COMMUNITY): Payer: BC Managed Care – PPO

## 2013-03-10 ENCOUNTER — Inpatient Hospital Stay (HOSPITAL_COMMUNITY)
Admission: EM | Admit: 2013-03-10 | Discharge: 2013-03-12 | DRG: 174 | Disposition: A | Discharge status: Home / Self Care | Payer: BC Managed Care – PPO | Attending: Family Medicine | Admitting: Family Medicine

## 2013-03-10 ENCOUNTER — Encounter (HOSPITAL_COMMUNITY): Payer: Self-pay | Admitting: Emergency Medicine

## 2013-03-10 DIAGNOSIS — R109 Unspecified abdominal pain: Secondary | ICD-10-CM | POA: Diagnosis present

## 2013-03-10 DIAGNOSIS — N189 Chronic kidney disease, unspecified: Secondary | ICD-10-CM | POA: Diagnosis present

## 2013-03-10 DIAGNOSIS — K922 Gastrointestinal hemorrhage, unspecified: Secondary | ICD-10-CM

## 2013-03-10 DIAGNOSIS — F1010 Alcohol abuse, uncomplicated: Secondary | ICD-10-CM

## 2013-03-10 DIAGNOSIS — K625 Hemorrhage of anus and rectum: Secondary | ICD-10-CM

## 2013-03-10 DIAGNOSIS — D696 Thrombocytopenia, unspecified: Secondary | ICD-10-CM

## 2013-03-10 DIAGNOSIS — I498 Other specified cardiac arrhythmias: Secondary | ICD-10-CM | POA: Diagnosis present

## 2013-03-10 DIAGNOSIS — K2980 Duodenitis without bleeding: Secondary | ICD-10-CM | POA: Diagnosis present

## 2013-03-10 DIAGNOSIS — K651 Peritoneal abscess: Secondary | ICD-10-CM

## 2013-03-10 DIAGNOSIS — K8689 Other specified diseases of pancreas: Secondary | ICD-10-CM

## 2013-03-10 DIAGNOSIS — D6959 Other secondary thrombocytopenia: Secondary | ICD-10-CM | POA: Diagnosis present

## 2013-03-10 DIAGNOSIS — K2270 Barrett's esophagus without dysplasia: Secondary | ICD-10-CM

## 2013-03-10 DIAGNOSIS — K630 Abscess of intestine: Secondary | ICD-10-CM | POA: Diagnosis present

## 2013-03-10 DIAGNOSIS — T3995XA Adverse effect of unspecified nonopioid analgesic, antipyretic and antirheumatic, initial encounter: Secondary | ICD-10-CM | POA: Diagnosis present

## 2013-03-10 DIAGNOSIS — D649 Anemia, unspecified: Secondary | ICD-10-CM

## 2013-03-10 DIAGNOSIS — D500 Iron deficiency anemia secondary to blood loss (chronic): Secondary | ICD-10-CM

## 2013-03-10 DIAGNOSIS — K921 Melena: Secondary | ICD-10-CM

## 2013-03-10 DIAGNOSIS — IMO0002 Reserved for concepts with insufficient information to code with codable children: Secondary | ICD-10-CM

## 2013-03-10 DIAGNOSIS — M1990 Unspecified osteoarthritis, unspecified site: Secondary | ICD-10-CM

## 2013-03-10 DIAGNOSIS — R197 Diarrhea, unspecified: Secondary | ICD-10-CM

## 2013-03-10 DIAGNOSIS — K2940 Chronic atrophic gastritis without bleeding: Secondary | ICD-10-CM | POA: Diagnosis present

## 2013-03-10 DIAGNOSIS — D72819 Decreased white blood cell count, unspecified: Secondary | ICD-10-CM

## 2013-03-10 DIAGNOSIS — Z823 Family history of stroke: Secondary | ICD-10-CM

## 2013-03-10 DIAGNOSIS — C649 Malignant neoplasm of unspecified kidney, except renal pelvis: Secondary | ICD-10-CM | POA: Diagnosis present

## 2013-03-10 DIAGNOSIS — C7889 Secondary malignant neoplasm of other digestive organs: Secondary | ICD-10-CM

## 2013-03-10 DIAGNOSIS — I129 Hypertensive chronic kidney disease with stage 1 through stage 4 chronic kidney disease, or unspecified chronic kidney disease: Secondary | ICD-10-CM | POA: Diagnosis present

## 2013-03-10 DIAGNOSIS — R001 Bradycardia, unspecified: Secondary | ICD-10-CM

## 2013-03-10 DIAGNOSIS — F17200 Nicotine dependence, unspecified, uncomplicated: Secondary | ICD-10-CM | POA: Diagnosis present

## 2013-03-10 DIAGNOSIS — K259 Gastric ulcer, unspecified as acute or chronic, without hemorrhage or perforation: Secondary | ICD-10-CM | POA: Diagnosis present

## 2013-03-10 DIAGNOSIS — Z905 Acquired absence of kidney: Secondary | ICD-10-CM

## 2013-03-10 DIAGNOSIS — M109 Gout, unspecified: Secondary | ICD-10-CM

## 2013-03-10 DIAGNOSIS — N289 Disorder of kidney and ureter, unspecified: Secondary | ICD-10-CM

## 2013-03-10 DIAGNOSIS — K5732 Diverticulitis of large intestine without perforation or abscess without bleeding: Secondary | ICD-10-CM | POA: Diagnosis present

## 2013-03-10 DIAGNOSIS — R112 Nausea with vomiting, unspecified: Secondary | ICD-10-CM

## 2013-03-10 DIAGNOSIS — M47812 Spondylosis without myelopathy or radiculopathy, cervical region: Secondary | ICD-10-CM | POA: Diagnosis present

## 2013-03-10 LAB — CBC WITH DIFFERENTIAL/PLATELET
Basophils Absolute: 0 10*3/uL (ref 0.0–0.1)
Basophils Relative: 1 % (ref 0–1)
Eosinophils Absolute: 0 10*3/uL (ref 0.0–0.7)
Lymphs Abs: 0.4 10*3/uL — ABNORMAL LOW (ref 0.7–4.0)
MCH: 37.7 pg — ABNORMAL HIGH (ref 26.0–34.0)
MCHC: 37.3 g/dL — ABNORMAL HIGH (ref 30.0–36.0)
Neutrophils Relative %: 78 % — ABNORMAL HIGH (ref 43–77)
Platelets: 108 10*3/uL — ABNORMAL LOW (ref 150–400)
RBC: 2.92 MIL/uL — ABNORMAL LOW (ref 4.22–5.81)
RDW: 20.8 % — ABNORMAL HIGH (ref 11.5–15.5)

## 2013-03-10 LAB — CLOSTRIDIUM DIFFICILE BY PCR: Toxigenic C. Difficile by PCR: NEGATIVE

## 2013-03-10 LAB — CBC
Hemoglobin: 11.2 g/dL — ABNORMAL LOW (ref 13.0–17.0)
Platelets: 103 10*3/uL — ABNORMAL LOW (ref 150–400)
RBC: 3.12 MIL/uL — ABNORMAL LOW (ref 4.22–5.81)
WBC: 4.1 10*3/uL (ref 4.0–10.5)

## 2013-03-10 LAB — COMPREHENSIVE METABOLIC PANEL
ALT: 16 U/L (ref 0–53)
AST: 26 U/L (ref 0–37)
Albumin: 3.5 g/dL (ref 3.5–5.2)
Alkaline Phosphatase: 81 U/L (ref 39–117)
Potassium: 4.1 mEq/L (ref 3.5–5.1)
Sodium: 138 mEq/L (ref 135–145)
Total Protein: 7.5 g/dL (ref 6.0–8.3)

## 2013-03-10 MED ORDER — PANTOPRAZOLE SODIUM 40 MG PO TBEC
40.0000 mg | DELAYED_RELEASE_TABLET | Freq: Two times a day (BID) | ORAL | Status: DC
  Administered 2013-03-10 – 2013-03-12 (×3): 40 mg via ORAL
  Filled 2013-03-10 (×4): qty 1

## 2013-03-10 MED ORDER — HYDROMORPHONE HCL PF 1 MG/ML IJ SOLN
1.0000 mg | Freq: Once | INTRAMUSCULAR | Status: AC
  Administered 2013-03-10: 1 mg via INTRAVENOUS
  Filled 2013-03-10: qty 1

## 2013-03-10 MED ORDER — SODIUM CHLORIDE 0.9 % IJ SOLN
3.0000 mL | Freq: Two times a day (BID) | INTRAMUSCULAR | Status: DC
  Administered 2013-03-11: 3 mL via INTRAVENOUS

## 2013-03-10 MED ORDER — ACETAMINOPHEN 325 MG PO TABS: 650.0000 mg | ORAL_TABLET | Freq: Four times a day (QID) | ORAL | Status: DC | PRN

## 2013-03-10 MED ORDER — SODIUM CHLORIDE 0.9 % IV SOLN: INTRAVENOUS | Status: DC

## 2013-03-10 MED ORDER — SODIUM CHLORIDE 0.9 % IV BOLUS (SEPSIS)
250.0000 mL | Freq: Once | INTRAVENOUS | Status: AC
  Administered 2013-03-10: 250 mL via INTRAVENOUS

## 2013-03-10 MED ORDER — ONDANSETRON HCL 4 MG/2ML IJ SOLN
4.0000 mg | Freq: Four times a day (QID) | INTRAMUSCULAR | Status: DC | PRN
  Administered 2013-03-11 – 2013-03-12 (×2): 4 mg via INTRAVENOUS
  Filled 2013-03-10 (×2): qty 2

## 2013-03-10 MED ORDER — IOHEXOL 300 MG/ML  SOLN
100.0000 mL | Freq: Once | INTRAMUSCULAR | Status: AC | PRN
  Administered 2013-03-10: 100 mL via INTRAVENOUS

## 2013-03-10 MED ORDER — HYDROMORPHONE HCL PF 1 MG/ML IJ SOLN
1.0000 mg | INTRAMUSCULAR | Status: DC | PRN
  Administered 2013-03-11 – 2013-03-12 (×6): 1 mg via INTRAVENOUS
  Filled 2013-03-10 (×7): qty 1

## 2013-03-10 MED ORDER — PIPERACILLIN-TAZOBACTAM 3.375 G IVPB
3.3750 g | Freq: Once | INTRAVENOUS | Status: AC
  Administered 2013-03-10: 3.375 g via INTRAVENOUS
  Filled 2013-03-10: qty 50

## 2013-03-10 MED ORDER — ACETAMINOPHEN 650 MG RE SUPP: 650.0000 mg | Freq: Four times a day (QID) | RECTAL | Status: DC | PRN

## 2013-03-10 MED ORDER — PANTOPRAZOLE SODIUM 40 MG IV SOLR: 40.0000 mg | INTRAVENOUS | Status: DC

## 2013-03-10 MED ORDER — SODIUM CHLORIDE 0.9 % IV SOLN
INTRAVENOUS | Status: DC
  Administered 2013-03-10: 750 mL via INTRAVENOUS

## 2013-03-10 MED ORDER — IOHEXOL 300 MG/ML  SOLN
50.0000 mL | Freq: Once | INTRAMUSCULAR | Status: AC | PRN
  Administered 2013-03-10: 50 mL via ORAL

## 2013-03-10 MED ORDER — PANTOPRAZOLE SODIUM 40 MG IV SOLR: 40.0000 mg | Freq: Two times a day (BID) | INTRAVENOUS | Status: DC

## 2013-03-10 MED ORDER — ONDANSETRON HCL 4 MG/2ML IJ SOLN
4.0000 mg | Freq: Once | INTRAMUSCULAR | Status: AC
  Administered 2013-03-10: 4 mg via INTRAVENOUS
  Filled 2013-03-10: qty 2

## 2013-03-10 MED ORDER — HYDROMORPHONE HCL PF 1 MG/ML IJ SOLN
1.0000 mg | INTRAMUSCULAR | Status: DC | PRN
  Administered 2013-03-10 (×2): 1 mg via INTRAVENOUS
  Filled 2013-03-10 (×2): qty 1

## 2013-03-10 MED ORDER — PIPERACILLIN-TAZOBACTAM 3.375 G IVPB
3.3750 g | Freq: Three times a day (TID) | INTRAVENOUS | Status: DC
  Administered 2013-03-10 – 2013-03-12 (×5): 3.375 g via INTRAVENOUS
  Filled 2013-03-10 (×9): qty 50

## 2013-03-10 MED ORDER — ONDANSETRON HCL 4 MG PO TABS: 4.0000 mg | ORAL_TABLET | Freq: Four times a day (QID) | ORAL | Status: DC | PRN

## 2013-03-10 MED ORDER — ONDANSETRON HCL 4 MG/2ML IJ SOLN: 4.0000 mg | Freq: Three times a day (TID) | INTRAMUSCULAR | Status: AC | PRN

## 2013-03-10 MED ORDER — SODIUM CHLORIDE 0.9 % IV SOLN
INTRAVENOUS | Status: DC
  Administered 2013-03-11 – 2013-03-12 (×2): via INTRAVENOUS

## 2013-03-10 NOTE — ED Notes (Signed)
Clydie Braun NP with Hospitalist at bedside with pt and family

## 2013-03-10 NOTE — ED Provider Notes (Signed)
History  This chart was scribed for Jeffrey Jakes, MD by Ardelia Mems, ED Scribe. This patient was seen in room APA18/APA18 and the patient's care was started at 7:13 AM.   CSN: [id number] Arrival date & time 03/10/13  0640    Chief Complaint  Patient presents with  . Abdominal Pain  . Melena     The history is provided by the patient. No language interpreter was used.   HPI Comments: Jeffrey Trujillo is a 62 y.o. male with a hx of Barrett's esophagus and diverticulosis of colon (dx 2011) who presents to the Emergency Department complaining of LLQ and RLQ, non-radiating, "7/10" abdominal pain described as cramping onset 5 days ago. Pt reports generalized weakness and diarrhea as associated symptoms onset 5 days ago and states that last night he had 4-5 episodes of black, tarry stools, described in triage as resembling coffee grounds, without any red coloration. Pt states that he has a hx of rectal bleeding in 09/2012 which is believed to be due to polyps. Pt denies taking Coumadin or daily blood thinners. Pt denies fever, sore throat, SOB, chest pain, nausea, vomiting, dysuria, hematuria, headches or any other symptoms. Pt is an occasional alcohol drinker and someday smoker.  PCP- Dr. [name] retired, goes to [facility] [place] [facility] to see other physicians now   Past Medical History  Diagnosis Date  . Gout   . DJD (degenerative joint disease) of cervical spine   . Diverticulosis of colon     by colonoscopy 11/02/2009  . Barrett's esophagus     by EGD 11/02/2009  . Cancer     "was told has cancer on other kidney and spot on pancreas"  . Renal cell cancer     with mets to pancreas   Past Surgical History  Procedure Laterality Date  . Nephrectomy      right due to cancer  . Lung removal, partial      left lower lobe, benign  . Eus  08/07/2012    Procedure: UPPER ENDOSCOPIC ULTRASOUND (EUS) LINEAR;  Surgeon: Rachael Fee, MD;  Location: [facility];   Service: Endoscopy;  Laterality: N/A;  . Inguinal hernia repair    . Hernia repair    . Inguinal hernia repair      Right  . Eus  08/21/2012    Procedure: UPPER ENDOSCOPIC ULTRASOUND (EUS) LINEAR;  Surgeon: Rachael Fee, MD;  Location: [facility];  Service: Endoscopy;  Laterality: N/A;  . Colonoscopy  Feb 2011    [name]: single anal papilla, pan-colonic diverticula  . Esophagogastroduodenoscopy  Feb 2011    [name]: salmon-colored epithelium consistent with Barrett's, nodular antral erosions, small hiatal hernia, negative H.pylori  . Colonoscopy  09/25/2012    RMR: Colonic diverticulosis. Suspect diverticular bleeding-hepatic flexure tic-sealed  . Esophagogastroduodenoscopy  09/25/2012    [name]:[id number]'W esophagus. Small hiatal hernia. Antral erosions of doubtful clinical significance, no biopsies   Family History  Problem Relation Age of Onset  . Lung cancer Brother   . Prostate cancer Brother   . Colon cancer Neg Hx   . Lumbar disc disease Brother   . Prostate cancer Brother    History  Substance Use Topics  . Smoking status: Current Some Day Smoker    Types: Cigars  . Smokeless tobacco: Never Used     Comment: occasional cigar  . Alcohol Use: Yes     Comment: vodka and budweiser, sometimes daily, depends on notion. couple drinks  Review of Systems  Constitutional: Negative for fever.  HENT: Negative for congestion, sore throat, rhinorrhea and neck pain.   Eyes: Negative for visual disturbance.  Respiratory: Negative for shortness of breath.   Cardiovascular: Negative for chest pain and leg swelling.  Gastrointestinal: Positive for diarrhea. Negative for nausea and vomiting.  Genitourinary: Negative for dysuria and hematuria.  Musculoskeletal: Negative for back pain.  Neurological: Negative for syncope, light-headedness and headaches.  Hematological: Does not bruise/bleed easily.   A complete 10 system review of systems was obtained and all systems are negative  except as noted in the HPI and PMH.   Allergies  Nsaids and Meperidine hcl  Home Medications   Current Outpatient Rx  Name  Route  Sig  Dispense  Refill  . indomethacin (INDOCIN) 50 MG capsule   Oral   Take 50 mg by mouth every 6 (six) hours as needed (gout).          . pazopanib (VOTRIENT) 200 MG tablet   Oral   Take 400 mg by mouth daily before breakfast. Take two tablets on an empty stomach.         . topiramate (TOPAMAX) 25 MG tablet   Oral   Take 25 mg by mouth 2 (two) times daily.          Triage Vitals: BP 149/77  Pulse 64  Temp(Src) 97.9 F (36.6 C) (Oral)  Resp 20  Ht 5\' 11"  (1.803 m)  Wt 178 lb (80.74 kg)  BMI 24.84 kg/m2  SpO2 100%  Physical Exam  Constitutional: He is oriented to person, place, and time. He appears well-developed and well-nourished.  HENT:  Head: Normocephalic and atraumatic.  Eyes: EOM are normal. Pupils are equal, round, and reactive to light.  Neck: Normal range of motion. Neck supple. No tracheal deviation present.  Cardiovascular: Normal rate, regular rhythm and normal heart sounds.   Pulmonary/Chest: Effort normal and breath sounds normal. No respiratory distress.  Abdominal: Soft. Bowel sounds are normal. There is tenderness (Mild tenderness in both lower quadrants). There is no guarding.  Genitourinary: Guaiac positive stool.  Normal rectal exam. No mass no stool in full to however some slight tint of redness. No external hemorrhoids. Guaiac positive.  Musculoskeletal: Normal range of motion. He exhibits no tenderness.  No swelling in the ankles, full ROM of toes.  Neurological: He is alert and oriented to person, place, and time.  Skin: Skin is warm and dry.  Psychiatric: He has a normal mood and affect.    ED Course  Procedures (including critical care time)  DIAGNOSTIC STUDIES: Oxygen Saturation is 100% on RA, normal by my interpretation.    COORDINATION OF CARE: 7:24 AM- Pt advised of plan for diagnostic labwork  and CT of abdomen and pt agrees.  7:52 AM- Recheck with pt and pt is feeling better after administration of Zofran and Dilaudid.   Medications  0.9 %  sodium chloride infusion (750 mLs Intravenous New Bag/Given 03/10/13 0732)  0.9 %  sodium chloride infusion (not administered)  HYDROmorphone (DILAUDID) injection 1 mg (not administered)  ondansetron (ZOFRAN) injection 4 mg (not administered)  piperacillin-tazobactam (ZOSYN) IVPB 3.375 g (not administered)  sodium chloride 0.9 % bolus 250 mL (0 mLs Intravenous Stopped 03/10/13 0807)  ondansetron (ZOFRAN) injection 4 mg (4 mg Intravenous Given 03/10/13 0731)  HYDROmorphone (DILAUDID) injection 1 mg (1 mg Intravenous Given 03/10/13 0732)  iohexol (OMNIPAQUE) 300 MG/ML solution 50 mL (50 mLs Oral Contrast Given 03/10/13 0729)  iohexol (OMNIPAQUE)  300 MG/ML solution 100 mL (100 mLs Intravenous Contrast Given 03/10/13 0841)  HYDROmorphone (DILAUDID) injection 1 mg (1 mg Intravenous Given 03/10/13 1045)     Labs Reviewed  CBC WITH DIFFERENTIAL - Abnormal; Notable for the following:    WBC 3.9 (*)    RBC 2.92 (*)    Hemoglobin 11.0 (*)    HCT 29.5 (*)    MCV 101.0 (*)    MCH 37.7 (*)    MCHC 37.3 (*)    RDW 20.8 (*)    Platelets 108 (*)    Neutrophils Relative % 78 (*)    Lymphocytes Relative 10 (*)    Lymphs Abs 0.4 (*)    All other components within normal limits  COMPREHENSIVE METABOLIC PANEL - Abnormal; Notable for the following:    Glucose, Bld 107 (*)    Creatinine, Ser 1.38 (*)    Total Bilirubin 1.3 (*)    GFR calc non Af Amer 54 (*)    GFR calc Af Amer 62 (*)    All other components within normal limits  OCCULT BLOOD, POC DEVICE - Abnormal; Notable for the following:    Fecal Occult Bld POSITIVE (*)    All other components within normal limits  STOOL CULTURE  CLOSTRIDIUM DIFFICILE BY PCR  OVA AND PARASITE EXAMINATION  LIPASE, BLOOD   Results for orders placed during the hospital encounter of 03/10/13  CBC WITH DIFFERENTIAL       Result Value Range   WBC 3.9 (*) 4.0 - 10.5 K/uL   RBC 2.92 (*) 4.22 - 5.81 MIL/uL   Hemoglobin 11.0 (*) 13.0 - 17.0 g/dL   HCT 78.4 (*) 69.6 - 29.5 %   MCV 101.0 (*) 78.0 - 100.0 fL   MCH 37.7 (*) 26.0 - 34.0 pg   MCHC 37.3 (*) 30.0 - 36.0 g/dL   RDW 28.4 (*) 13.2 - 44.0 %   Platelets 108 (*) 150 - 400 K/uL   Neutrophils Relative % 78 (*) 43 - 77 %   Neutro Abs 3.0  1.7 - 7.7 K/uL   Lymphocytes Relative 10 (*) 12 - 46 %   Lymphs Abs 0.4 (*) 0.7 - 4.0 K/uL   Monocytes Relative 11  3 - 12 %   Monocytes Absolute 0.4  0.1 - 1.0 K/uL   Eosinophils Relative 1  0 - 5 %   Eosinophils Absolute 0.0  0.0 - 0.7 K/uL   Basophils Relative 1  0 - 1 %   Basophils Absolute 0.0  0.0 - 0.1 K/uL  COMPREHENSIVE METABOLIC PANEL      Result Value Range   Sodium 138  135 - 145 mEq/L   Potassium 4.1  3.5 - 5.1 mEq/L   Chloride 101  96 - 112 mEq/L   CO2 28  19 - 32 mEq/L   Glucose, Bld 107 (*) 70 - 99 mg/dL   BUN 17  6 - 23 mg/dL   Creatinine, Ser 1.02 (*) 0.50 - 1.35 mg/dL   Calcium 9.7  8.4 - 72.5 mg/dL   Total Protein 7.5  6.0 - 8.3 g/dL   Albumin 3.5  3.5 - 5.2 g/dL   AST 26  0 - 37 U/L   ALT 16  0 - 53 U/L   Alkaline Phosphatase 81  39 - 117 U/L   Total Bilirubin 1.3 (*) 0.3 - 1.2 mg/dL   GFR calc non Af Amer 54 (*) >90 mL/min   GFR calc Af Amer 62 (*) >90 mL/min  LIPASE, BLOOD      Result Value Range   Lipase 46  11 - 59 U/L  OCCULT BLOOD, POC DEVICE      Result Value Range   Fecal Occult Bld POSITIVE (*) NEGATIVE     Ct Abdomen Pelvis W Contrast  03/10/2013   *RADIOLOGY REPORT*  Clinical Data: Lower abdominal pain. Melena.  Metastatic renal cell carcinoma.  CT ABDOMEN AND PELVIS WITH CONTRAST  Technique:  Multidetector CT imaging of the abdomen and pelvis was performed following the standard protocol during bolus administration of intravenous contrast.  Contrast: 50mL OMNIPAQUE IOHEXOL 300 MG/ML  SOLN, OMNIPAQUE IOHEXOL 300 MG/ML  SOLN  Comparison: 12/29/2012  Findings: The  patient has developed a 2 cm abscess containing fluid and a tiny bubble of air in the pelvic cul-de-sac.  There is slight mucosal thickening and numerous diverticula in the distal colon as well as an area of mucosal thickening in the anterior wall of the distal sigmoid.  The metastatic lesions in the left kidney, pancreatic head, and right paraspinal musculature are appreciably less prominent than on the prior study.  Liver, spleen, adrenal glands, and abdominal aorta are normal. There is no free air. No acute osseous abnormality.  IMPRESSION: New small abscess in the pelvic cul-de-sac.  This could be originating from diverticula in the colon although there is a mucosal thickening of the adjacent distal sigmoid which could represent a mass although I think this is less likely.   Original Report Authenticated By: Francene Boyers, M.D.    1. Diarrhea   2. Rectal bleeding   3. Melena   4. Abscess of abdominal cavity     MDM  Patient will be admitted to telemetry by hospitalist team. Patient with several concerns. Had a bowel movement in the emergency apartment definitely black in color consistent with melena. Rectal exam as stated above. Patient also had a period of bradycardia or heart rate went down into the 40s not sure if that was related to abdominal cramping. Patient was asymptomatic with that. Patient also on CT scan shows a small pelvic abscess. Will start antibiotics for that the Zosyn. Patient is followed by Dr. Kendell Bane from GI medicine. Patient is also followed by heme on in [place]. CT scan shows some improvement in the metastatic lesion in the left kidney and pancreatic head. Patient's hemoglobin was just a slight drop compared to wear was on June 19. Patient's vital signs here without tachycardia or hypotension.         I personally performed the services described in this documentation, which was scribed in my presence. The recorded information has been reviewed and is  accurate.      Jeffrey Jakes, MD 03/10/13 1049

## 2013-03-10 NOTE — ED Notes (Signed)
Pt ambulatory to restroom with c/o diarrhea, pt and family updated on plan of care and admission to hospital

## 2013-03-10 NOTE — ED Notes (Signed)
Report given to the floor,   

## 2013-03-10 NOTE — Progress Notes (Signed)
ANTIBIOTIC CONSULT NOTE - INITIAL  Pharmacy Consult for Zosyn Indication: diverticulosis of colon  Allergies  Allergen Reactions  . Nsaids Other (See Comments)    Patient only has one kidney.   . Meperidine Hcl Hives and Rash   Patient Measurements: Height: 5\' 11"  (180.3 cm) Weight: 178 lb (80.74 kg) IBW/kg (Calculated) : 75.3  Vital Signs: Temp: 97.9 F (36.6 C) (06/24 0651) Temp src: Oral (06/24 0651) BP: 165/73 mmHg (06/24 1003) Pulse Rate: 51 (06/24 1003) Intake/Output from previous day:   Intake/Output from this shift:    Labs:  Recent Labs  03/10/13 0732  WBC 3.9*  HGB 11.0*  PLT 108*  CREATININE 1.38*   Estimated Creatinine Clearance: 59.9 ml/min (by C-G formula based on Cr of 1.38). No results found for this basename: VANCOTROUGH, Leodis Binet, VANCORANDOM, GENTTROUGH, GENTPEAK, GENTRANDOM, TOBRATROUGH, TOBRAPEAK, TOBRARND, AMIKACINPEAK, AMIKACINTROU, AMIKACIN,  in the last 72 hours   Microbiology: Recent Results (from the past 720 hour(s))  CLOSTRIDIUM DIFFICILE BY PCR     Status: None   Collection Time    03/10/13 10:00 AM      Result Value Range Status   C difficile by pcr NEGATIVE  NEGATIVE Final   Medical History: Past Medical History  Diagnosis Date  . Gout   . DJD (degenerative joint disease) of cervical spine   . Diverticulosis of colon     by colonoscopy 11/02/2009  . Barrett's esophagus     by EGD 11/02/2009  . Cancer     "was told has cancer on other kidney and spot on pancreas"  . 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 Anemia     Medications:  Scheduled:  . sodium chloride  3 mL Intravenous Q12H   Assessment: 62yo male with h/o Barrett's esophagus and diverticulitis.  Pt to be started on Zosyn empirically.  Estimated Creatinine Clearance: 59.9 ml/min (by C-G formula based on Cr of 1.38).  Goal of Therapy:  Eradicate infection.  Plan:  Zosyn 3.375gm IV q8hrs to be infused over 4 hours Monitor renal fxn and  progress Duration of therapy per MD  Valrie Hart A 03/10/2013,11:57 AM

## 2013-03-10 NOTE — Consult Note (Addendum)
Referring Provider: No ref. provider found Primary Care Physician:  [name], MD Primary Gastroenterologist:  [name]  Reason for Consultation:  BLACK STOOL  HPI:  PT PMHx: RCC ON VOTRIENT. LAST SEEN APR 2014. WAS TO CONTINUE ON PPI INDEFINITELY BUT RAN OUT FOR > 1 MO. TAKING INDOMETHACIN Q8H FOR 3-7 DAYS INTERMITTENTLY FOR GOUT. DRINKING LIQUOR. PRESENTED WITH LOWER ABD PAIN AND WATERY STOOLS. SUBJECTIVE CHILLS. NO FEVER. REPORTS BLACK WATERY STOOLS. CT IN ED SHOWED DIVERTICULITIS/?ABSCESS. C/O LOWER ABD PAIN. LAST EGD/TCS JAN 2014: FELT OVERSEDATED. C/O PAIN IN LEFT GREAT TOE.  PT DENIES BRBPR, nausea, vomiting, melena, OR heartburn or indigestion.   Past Medical History  Diagnosis Date  . Gout   . DJD (degenerative joint disease) of cervical spine   . Diverticulosis of colon     by colonoscopy 11/02/2009  . Barrett's esophagus     by EGD 11/02/2009  . Cancer     "was told has cancer on other kidney and spot on pancreas"  . 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 Anemia    Past Surgical History  Procedure Laterality Date  . Nephrectomy      right due to cancer  . Lung removal, partial      left lower lobe, benign  . Eus  08/07/2012    Procedure: UPPER ENDOSCOPIC ULTRASOUND (EUS) LINEAR;  Surgeon: Rachael Fee, MD;  Location: [facility];  Service: Endoscopy;  Laterality: N/A;  . Inguinal hernia repair    . Hernia repair    . Inguinal hernia repair      Right  . Eus  08/21/2012    Procedure: UPPER ENDOSCOPIC ULTRASOUND (EUS) LINEAR;  Surgeon: Rachael Fee, MD;  Location: [facility];  Service: Endoscopy;  Laterality: N/A;  . Colonoscopy  Feb 2011    [name]: single anal papilla, pan-colonic diverticula  . Esophagogastroduodenoscopy  Feb 2011    [name]: salmon-colored epithelium consistent with Barrett's, nodular antral erosions, small hiatal hernia, negative H.pylori  . Colonoscopy  09/25/2012    RMR: Colonic diverticulosis. Suspect diverticular  bleeding-hepatic flexure tic-sealed  . Esophagogastroduodenoscopy  09/25/2012    ZOX:[id number] esophagus. Small hiatal hernia. Antral erosions of doubtful clinical significance, no biopsies    Prior to Admission medications   Medication Sig Start Date End Date Taking? Authorizing Provider  indomethacin (INDOCIN) 50 MG capsule Take 50 mg by mouth every 6 (six) hours as needed (gout).  02/28/13  Yes Historical Provider, MD  pazopanib (VOTRIENT) 200 MG tablet Take 400 mg by mouth daily before breakfast. Take two tablets on an empty stomach. 02/03/13  Yes Benjiman Core, MD  topiramate (TOPAMAX) 25 MG tablet Take 25 mg by mouth 2 (two) times daily.   Yes Historical Provider, MD    Current Facility-Administered Medications  Medication Dose Route Frequency Provider Last Rate Last Dose  . 0.9 %  sodium chloride infusion   Intravenous Continuous Gwenyth Bender, NP 75 mL/hr at 03/10/13 1154    . acetaminophen (TYLENOL) tablet 650 mg  650 mg Oral Q6H PRN Gwenyth Bender, NP       Or  . acetaminophen (TYLENOL) suppository 650 mg  650 mg Rectal Q6H PRN Gwenyth Bender, NP      . HYDROmorphone (DILAUDID) injection 1 mg  1 mg Intravenous Q4H PRN Shelda Jakes, MD      . ondansetron [facility]) injection 4 mg  4 mg Intravenous Q8H PRN Shelda Jakes, MD      .  ondansetron (ZOFRAN) tablet 4 mg  4 mg Oral Q6H PRN Gwenyth Bender, NP       Or  . ondansetron [facility]) injection 4 mg  4 mg Intravenous Q6H PRN Lesle Chris Black, NP      . pantoprazole (PROTONIX) injection 40 mg  40 mg Intravenous Q12H Elliot Cousin, MD      . piperacillin-tazobactam (ZOSYN) IVPB 3.375 g  3.375 g Intravenous Q8H Shelda Jakes, MD      . sodium chloride 0.9 % injection 3 mL  3 mL Intravenous Q12H Gwenyth Bender, NP        Allergies as of 03/10/2013 - Review Complete 03/10/2013  Allergen Reaction Noted  . Nsaids Other (See Comments) 06/05/2011  . Meperidine hcl Hives and Rash     Family History:  Colon Cancer  negative                            Polyps  negative   History   Social History  . Marital Status: Married    Spouse Name: N/A    Number of Children: N/A  . Years of Education: N/A   Occupational History  .      textiles in [place]    Social History Main Topics  . Smoking status: Current Some Day Smoker    Types: Cigars  . Smokeless tobacco: Never Used     Comment: occasional cigar  . Alcohol Use: Yes     Comment: vodka and budweiser, sometimes daily, depends on notion. couple drinks  . Drug Use: No  . Sexually Active: Yes   Review of Systems: PER HPI OTHERWISE ALL SYSTEMS ARE NEGATIVE.  Vitals: Blood pressure 162/84, pulse 54, temperature 98.4 F (36.9 C), temperature source Oral, resp. rate 14, height 5\' 11"  (1.803 m), weight 172 lb 13.5 oz (78.4 kg), SpO2 96.00%.  Physical Exam: General:   Alert,  Well-developed, well-nourished, pleasant and cooperative, MILD DISTRESS Head:  Normocephalic and atraumatic. Eyes:  Sclera clear, no icterus.   Conjunctiva pink. Mouth:  No deformity or lesions, dentition ABnormal. Neck:  Supple; no masses  Lungs:  Clear throughout to auscultation.   No wheezes. No acute distress. Heart:  Regular rate and rhythm; no murmurs Abdomen:  Soft, nontender and nondistended. Normal bowel sounds, without guarding, and without rebound.   Msk:  Symmetrical with gross deformities. Normal posture. Extremities:  Without edema. BILS SCDs IN PLACE. Neurologic:  Alert and  oriented x4;  grossly normal neurologically. Cervical Nodes:  No significant cervical adenopathy. Psych:  Alert and cooperative. Normal mood and FLAT affect.  Lab Results:  Recent Labs  03/10/13 0732  WBC 3.9*  HGB 11.0*  HCT 29.5*  PLT 108*   BMET  Recent Labs  03/10/13 0732  NA 138  K 4.1  CL 101  CO2 28  GLUCOSE 107*  BUN 17  CREATININE 1.38*  CALCIUM 9.7   LFT  Recent Labs  03/10/13 0732  PROT 7.5  ALBUMIN 3.5  AST 26  ALT 16  ALKPHOS 81  BILITOT 1.3*      Studies/Results: CT ABD/PELVIS W/ IVC-JUN 24- I PERSONALLY REVIEWED CT WITH DR. Tyron Russell. SML FLUID COLLECTION BETWEEN SIGMOID COLON/RECTUM, GASTRIC WALL NL  Impression: ADMITTED WITH DIVERTICULITIS WITH SML PERFORATION & BLACK WATERY STOOLS-? MELENA. RISK FACTORS: LOW PLTS. VOTRIENT, ETOH, NSAIDS W/O PPI, RCC METS TO PANCREAS. DIFFERENTIAL DIAGNOSIS INCLUDES PUD, DIEULAFOY'S LESION, ETOH GASTRITIS, LESS LIKELY HEMOSUCCOUS PANCREATICUS.  Plan: 1.  EGD JUN 25 TO ASSESS MELENA. WILL USE FENATNYL. PHENERGAN 12. 5 MG IV IN PRE-OP. 2. HOLD VOTRIENT. 3. FULL LIQUID DIET 4. BID PPI 5. 10 DAYS ABX 6. NO NEED FOR REPEAT TCS AT THIS TIME.   LOS: 0 days   [facility]  03/10/2013, 3:19 PM

## 2013-03-10 NOTE — Progress Notes (Signed)
Pt came up to floor from ED. Pt is in NAD, will continue to monitor.

## 2013-03-10 NOTE — ED Notes (Signed)
Patient has had multiple bowel movements in past 30 minutes. Patient now c/o lower abd pain returning, rates pain at a 6 out of 10. Dr Deretha Emory aware.

## 2013-03-10 NOTE — ED Notes (Signed)
Dr. Deretha Emory at bedside speaking with pt and family

## 2013-03-10 NOTE — ED Notes (Signed)
Patient complaining of lower abdominal pain x 1 week. Reports last night started having black stools that resemble coffee grounds.

## 2013-03-10 NOTE — H&P (Signed)
[facility] History and Physical  Jeffrey Trujillo XBJ:[id number] DOB: May 24, 1951 DOA: 03/10/2013  Referring physician:  PCP: [name], MD  Specialists:   Chief Complaint: abdominal pain/diarrhea  HPI: Jeffrey Trujillo is a is a very pleasant 62 y.o. male with past medical history that includes renal cell cancer, diverticulosis, Barrett's esophagus, gout, presents to the emergency department with the chief complaint of abdominal pain and diarrhea. Information is obtained from the patient and his wife who is at the bedside. He reports that 6 days ago he developed sudden abdominal pain in the lower quadrants. At onset he states the pain was constant and sharp and he rated it a 6/10. He also states at that time he had pain in his rectum. He also developed loose stools. He reports the stools were normal in color. He called his doctor who recommended a clear liquid diet. He states he was able to tolerate clear liquids and the abdominal pain subsided as well as the diarrhea over the next 2 days. He states he began to eat a regular diet and his stools were formed and he had no further abdominal pain. Then, 2 days ago he developed some nausea and had one episode of emesis that was clear. He denied any coffee ground emesis. Yesterday after work he developed sudden diarrhea. He states that this time the abdominal pain was less than before and he had little nausea. He states the pain at this time was intermittent and crampy-like and usually just before a bowel movement. He states that once he had a bowel movement the pain subsided. He states the diarrhea was very dark and tarry in nature. He indicates that he has had 5 or 6 episodes during the night and into this morning. He denies any bright red blood. He denies any shortness of breath lightheadedness syncope or near-syncope. He denies chest pain palpitations. He states this morning when the darkness of his stool persisted he decided to come to the  emergency room. Of note, he he had recently taken indomethacin for frequent gout flareups. However, he was recently advised to discontinue indomethacin because of his renal function. Lab work in the emergency room was significant for a white count of 3.9, hemoglobin of 11.0, platelets of 108, creatinine 1.38. Stool for C. difficile by PCR is negative. Vital signs are significant for a blood pressure of 165/73 and a heart rate of 54. CT of the abdomen yields a new small abscess in the pelvic cul-de-sac. [facility] were asked to admit for further evaluation and treatment.   Review of Systems: The patient denies anorexia, fever, weight loss,, vision loss, decreased hearing, hoarseness, chest pain, syncope, dyspnea on exertion, peripheral edema, balance deficits, hemoptysis,  severe indigestion/heartburn, hematuria, incontinence, genital sores, muscle weakness, suspicious skin lesions, transient blindness, difficulty walking, depression, unusual weight change, abnormal bleeding, enlarged lymph nodes, angioedema, and breast masses.    Past Medical History  Diagnosis Date  . Gout   . DJD (degenerative joint disease) of cervical spine   . Diverticulosis of colon     by colonoscopy 11/02/2009  . Barrett's esophagus     by EGD 11/02/2009  . Cancer     "was told has cancer on other kidney and spot on pancreas"  . 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 Anemia    Past Surgical History  Procedure Laterality Date  . Nephrectomy      right due to cancer  . Lung removal,  partial      left lower lobe, benign  . Eus  08/07/2012    Procedure: UPPER ENDOSCOPIC ULTRASOUND (EUS) LINEAR;  Surgeon: Rachael Fee, MD;  Location: [facility];  Service: Endoscopy;  Laterality: N/A;  . Inguinal hernia repair    . Hernia repair    . Inguinal hernia repair      Right  . Eus  08/21/2012    Procedure: UPPER ENDOSCOPIC ULTRASOUND (EUS) LINEAR;  Surgeon: Rachael Fee, MD;  Location: [facility];   Service: Endoscopy;  Laterality: N/A;  . Colonoscopy  Feb 2011    [name]: single anal papilla, pan-colonic diverticula  . Esophagogastroduodenoscopy  Feb 2011    [name]: salmon-colored epithelium consistent with Barrett's, nodular antral erosions, small hiatal hernia, negative H.pylori  . Colonoscopy  09/25/2012    RMR: Colonic diverticulosis. Suspect diverticular bleeding-hepatic flexure tic-sealed  . Esophagogastroduodenoscopy  09/25/2012    ZOX:[id number] esophagus. Small hiatal hernia. Antral erosions of doubtful clinical significance, no biopsies   Social History:  reports that he has been smoking Cigars.  He has never used smokeless tobacco. He reports that  drinks alcohol. Patient reports that he drinks about a point of vodka every 2 days. He reports he abstained from alcohol from January to April and has just recently started drinking again. His last drink was approximately one week ago. He denies history of DT's, or  withdrawal syndrome. His wife agrees. He reports that he does not use illicit drugs.  Patient is married and lives at home with his wife and has done so for the last 46 years. He is currently employed in a [employer]. Allergies  Allergen Reactions  . Nsaids Other (See Comments)    Patient only has one kidney.   . Meperidine Hcl Hives and Rash    Family History  Problem Relation Age of Onset  . Lung cancer Brother   . Prostate cancer Brother   . Colon cancer Neg Hx   . Lumbar disc disease Brother   . Prostate cancer Brother    His mother died at age 71 after several strokes and his father is deceased at age 70 from emphysema. Patient has 6 siblings. Their collective medical history is positive for lung cancer prostate, cancer, hypertension  Prior to Admission medications   Medication Sig Start Date End Date Taking? Authorizing Provider  indomethacin (INDOCIN) 50 MG capsule Take 50 mg by mouth every 6 (six) hours as needed (gout).  02/28/13  Yes  Historical Provider, MD  pazopanib (VOTRIENT) 200 MG tablet Take 400 mg by mouth daily before breakfast. Take two tablets on an empty stomach. 02/03/13  Yes Benjiman Core, MD  topiramate (TOPAMAX) 25 MG tablet Take 25 mg by mouth 2 (two) times daily.   Yes Historical Provider, MD   Physical Exam: Filed Vitals:   03/10/13 0651 03/10/13 0919 03/10/13 1003  BP: 149/77 147/76 165/73  Pulse: 64 54 51  Temp: 97.9 F (36.6 C)    TempSrc: Oral    Resp: 20 18 18   Height: 5\' 11"  (1.803 m)    Weight: 80.74 kg (178 lb)    SpO2: 100% 96% 100%     General:  Well-nourished, no acute distress  Eyes: PERRL, EOMI, no scleral  icterus  ENT: Ears clear, nose without drainage, oropharynx moist slightly pale with no erythema or exudate noted  Neck: Supple no lymphadenopathy with full range of motion  Cardiovascular: Regular rate and rhythm with no murmur gallop  or rub. No lower extremity edema  Respiratory: Normal effort breath sounds clear bilaterally with no rhonchi no wheeze  Abdomen: Hyperactive bowel sounds, soft, minimally tender in the hypogastrium. No rebound, rigidity, or masses palpated.   Skin: Warm and dry. There are no rashes or lesions  Musculoskeletal: No clubbing or cyanosis.  Noted left great toe with mild erythema and very tender to touch.  Psychiatric: Calm,  cooperative and appropriate during examination. No tremulousness.   Neurologic: Cranial nerves II through XII grossly intact. Speech is clear.Strength is grossly 5 over 5 throughout. Sensation is intact.  Labs on Admission:  Basic Metabolic Panel:  Recent Labs Lab 03/05/13 1410 03/10/13 0732  NA 139 138  K 3.8 4.1  CL 104 101  CO2 26 28  GLUCOSE 119* 107*  BUN 19.1 17  CREATININE 1.4* 1.38*  CALCIUM 9.2 9.7   Liver Function Tests:  Recent Labs Lab 03/05/13 1410 03/10/13 0732  AST 26 26  ALT 15 16  ALKPHOS 87 81  BILITOT 2.36* 1.3*  PROT 7.5 7.5  ALBUMIN 3.5 3.5    Recent Labs Lab  03/10/13 0732  LIPASE 46   No results found for this basename: AMMONIA,  in the last 168 hours CBC:  Recent Labs Lab 03/05/13 1410 03/10/13 0732  WBC 3.5* 3.9*  NEUTROABS 2.6 3.0  HGB 11.7* 11.0*  HCT 33.2* 29.5*  MCV 98.2* 101.0*  PLT 82 confirmed both analyzers* 108*   Cardiac Enzymes: No results found for this basename: CKTOTAL, CKMB, CKMBINDEX, TROPONINI,  in the last 168 hours  BNP (last 3 results) No results found for this basename: PROBNP,  in the last 8760 hours CBG: No results found for this basename: GLUCAP,  in the last 168 hours  Radiological Exams on Admission: Ct Abdomen Pelvis W Contrast  03/10/2013   *RADIOLOGY REPORT*  Clinical Data: Lower abdominal pain. Melena.  Metastatic renal cell carcinoma.  CT ABDOMEN AND PELVIS WITH CONTRAST  Technique:  Multidetector CT imaging of the abdomen and pelvis was performed following the standard protocol during bolus administration of intravenous contrast.  Contrast: 50mL OMNIPAQUE IOHEXOL 300 MG/ML  SOLN, OMNIPAQUE IOHEXOL 300 MG/ML  SOLN  Comparison: 12/29/2012  Findings: The patient has developed a 2 cm abscess containing fluid and a tiny bubble of air in the pelvic cul-de-sac.  There is slight mucosal thickening and numerous diverticula in the distal colon as well as an area of mucosal thickening in the anterior wall of the distal sigmoid.  The metastatic lesions in the left kidney, pancreatic head, and right paraspinal musculature are appreciably less prominent than on the prior study.  Liver, spleen, adrenal glands, and abdominal aorta are normal. There is no free air. No acute osseous abnormality.  IMPRESSION: New small abscess in the pelvic cul-de-sac.  This could be originating from diverticula in the colon although there is a mucosal thickening of the adjacent distal sigmoid which could represent a mass although I think this is less likely.   Original Report Authenticated By: Francene Boyers, M.D.    EKG:  Pending  Assessment/Plan Principal Problem:   Melena:Possibly NSAID induced. The patient was recently taking indomethacin for gout up until a few weeks ago.  Patient with history of diverticulosis and diverticular bleed in January of 2014. He did have a colonoscopy at that time. Will admit to telemetry. Will provide patient with IV fluids for gentle hydration. Will keep patient n.p.o. at this time. Will request a gastroenterologist consult. Will provide pain medicine  for his abdominal pain and Zofran for any nausea that he may have.  Abdominal abscess: Per CT. Patient is currently afebrile and nontoxic appearing. Will continue Zosyn empirically. Will request a general surgical consult for any recommendations.   Abdominal pain: On exam, his abdomen is soft and minimally tender. His discomfort is much less now. Likely related to #1 provide pain medicine on an as-needed basis.    Acute on chronic renal insufficiency: Chart review indicates that patient's current creatinine level of 1.38 is close to his baseline over the last 5 months. Etiology likely multifactorial including renal cancer, medications as well as #1. Will very gently hydrate with IV fluids. Will monitor closely.    Anemia: Chart review indicates that current hemoglobin of 11.0 is close to baseline range of what appears to be 11-12.9 over the last 3 months. Will get serial CBCs to monitor hemoglobin. But I do not anticipate the patient needing transfusion at this time.    Renal cell cancer: Patient last seen by Dr. [name] on 03/05/2013. Patient was diagnosed with right kidney cancer in 2010. He had a radical nephrectomy in 2010. In 2013 a left kidney mass appeared as well as a pancreatic lesion. In December of 2013 Votrient was started at 800 mg daily. In March of 2014 this treatment was held due to an increase in LFTs. In April of 2014 this medication was resumed at 400 mg daily. He currently takes 400 mg daily. We will hold this for now.  Patient is scheduled for his next CT July 2014 .  Barrett's esophagus: Last biopsy 2011. In January of this year patient was hospitalized for lower GI bleed diverticular in origin and requiring a colonoscopy with clips. At that time an EGD was performed and yielded antral erosions and Barrett's. No biopsy was performed at that time. Patient was recently seen by GI. The timing of repeat EGD for biopsies has yet to be determined. PPI recommended indefinitely. Am requesting GI consultation for problem #1.    Leukopenia: Likely related to renal cell cancer treatment. Current level at the lower end of normal range. He is afebrile and nontoxic appearing. Will monitor closely    Thrombocytopenia: Likely related to renal cell cancer treatment. No signs symptoms of active bleeding. Will monitor closely.      Gout flare: Left great toe area mildly erythemic and very tender to touch. Patient reports a gout flare up about every 2 weeks. He also admits to not adhering to diet. He reports that due to his renal cell cancer he is only allowed to take prednisone for these flareups. Will provide analgesics for now.   ETOH abuse:The patient reports that his last alcoholic beverage was approximately one week ago. He drinks approximately a half a pint of vodka daily most days. He denies previous history of alcohol withdrawal syndrome or DTs. He is on long periods of time without drinking alcohol on multiple occasions. His wife endorses this. Will start thiamine IV/by mouth for several days and when necessary Ativan just in case.  Bradycardia. EKG was not ordered in the emergency department. We'll order an EKG and check his thyroid function to rule out hypothyroidism.          Consults requested. Gastroenterology  General surgery  Code Status: Full Family Communication: Wife at bedside Disposition Plan: Home when ready  Time spent: 75 min  Gwenyth Bender [facility] Pager [phone]  If 7PM-7AM, please  contact night-coverage [email or web address] Password [facility] 03/10/2013, 12:04 PM  Attending:   The patient was seen and examined. He was discussed with nurse practitioner, Ms. Vedia Coffer. The above note has been amended and/or annotated. Recent use of indomethacin for gout flareups may be contribute into his melena and probable subacute upper GI bleed. Also, Votrient has GI perforation, hemorrhage, and diarrhea listed as it is serious side effects, among others. He is currently hemodynamically stable. The CT scan reveals small abscess in the pelvic cul-de-sac, possibly originating from colonic diverticula. Surprisingly, he is afebrile and does not have a leukocytosis. Nevertheless, he is immunosuppressed with oral chemotherapy and malignancy. We'll continue Zosyn as ordered. Will increase PPI to every 12 hours. GI and general surgery consulted. We'll keep him n.p.o. until they evaluate him and make recommendations regarding advancement of diet and otherwise.

## 2013-03-11 ENCOUNTER — Encounter (HOSPITAL_COMMUNITY)
Admission: EM | Disposition: A | Discharge status: Home / Self Care | Payer: Self-pay | Source: Home / Self Care | Attending: Family Medicine

## 2013-03-11 ENCOUNTER — Other Ambulatory Visit: Payer: Self-pay

## 2013-03-11 DIAGNOSIS — N289 Disorder of kidney and ureter, unspecified: Secondary | ICD-10-CM

## 2013-03-11 DIAGNOSIS — K259 Gastric ulcer, unspecified as acute or chronic, without hemorrhage or perforation: Secondary | ICD-10-CM

## 2013-03-11 DIAGNOSIS — M109 Gout, unspecified: Secondary | ICD-10-CM

## 2013-03-11 DIAGNOSIS — K2270 Barrett's esophagus without dysplasia: Secondary | ICD-10-CM

## 2013-03-11 DIAGNOSIS — K921 Melena: Secondary | ICD-10-CM

## 2013-03-11 DIAGNOSIS — D649 Anemia, unspecified: Secondary | ICD-10-CM

## 2013-03-11 DIAGNOSIS — K2940 Chronic atrophic gastritis without bleeding: Secondary | ICD-10-CM

## 2013-03-11 DIAGNOSIS — R109 Unspecified abdominal pain: Secondary | ICD-10-CM

## 2013-03-11 HISTORY — PX: ESOPHAGOGASTRODUODENOSCOPY: SHX5428

## 2013-03-11 LAB — COMPREHENSIVE METABOLIC PANEL
ALT: 14 U/L (ref 0–53)
AST: 23 U/L (ref 0–37)
Alkaline Phosphatase: 64 U/L (ref 39–117)
CO2: 30 mEq/L (ref 19–32)
Chloride: 101 mEq/L (ref 96–112)
GFR calc Af Amer: 59 mL/min — ABNORMAL LOW (ref >90)
GFR calc non Af Amer: 51 mL/min — ABNORMAL LOW (ref >90)
Glucose, Bld: 90 mg/dL (ref 70–99)
Potassium: 4.1 mEq/L (ref 3.5–5.1)
Sodium: 137 mEq/L (ref 135–145)

## 2013-03-11 LAB — CBC
MCH: 36.1 pg — ABNORMAL HIGH (ref 26.0–34.0)
MCHC: 35.5 g/dL (ref 30.0–36.0)
Platelets: 110 10*3/uL — ABNORMAL LOW (ref 150–400)
RDW: 20.9 % — ABNORMAL HIGH (ref 11.5–15.5)

## 2013-03-11 LAB — OVA AND PARASITE EXAMINATION

## 2013-03-11 SURGERY — EGD (ESOPHAGOGASTRODUODENOSCOPY)
Anesthesia: Moderate Sedation

## 2013-03-11 MED ORDER — BUTAMBEN-TETRACAINE-BENZOCAINE 2-2-14 % EX AERO
INHALATION_SPRAY | CUTANEOUS | Status: DC | PRN
  Administered 2013-03-11: 2 via TOPICAL

## 2013-03-11 MED ORDER — PROMETHAZINE HCL 25 MG/ML IJ SOLN
INTRAMUSCULAR | Status: AC
  Filled 2013-03-11: qty 1

## 2013-03-11 MED ORDER — FENTANYL CITRATE 0.05 MG/ML IJ SOLN
INTRAMUSCULAR | Status: DC | PRN
  Administered 2013-03-11: 25 ug via INTRAVENOUS

## 2013-03-11 MED ORDER — SODIUM CHLORIDE 0.9 % IV SOLN: INTRAVENOUS | Status: DC

## 2013-03-11 MED ORDER — STERILE WATER FOR IRRIGATION IR SOLN
Status: DC | PRN
  Administered 2013-03-11: 11:00:00

## 2013-03-11 MED ORDER — PREDNISONE 20 MG PO TABS
40.0000 mg | ORAL_TABLET | Freq: Every day | ORAL | Status: DC
  Administered 2013-03-11 – 2013-03-12 (×2): 40 mg via ORAL
  Filled 2013-03-11 (×2): qty 2

## 2013-03-11 MED ORDER — PANTOPRAZOLE SODIUM 40 MG IV SOLR
40.0000 mg | Freq: Once | INTRAVENOUS | Status: AC
  Administered 2013-03-11: 40 mg via INTRAVENOUS
  Filled 2013-03-11: qty 40

## 2013-03-11 MED ORDER — MIDAZOLAM HCL 5 MG/5ML IJ SOLN
INTRAMUSCULAR | Status: DC | PRN
  Administered 2013-03-11: 1 mg via INTRAVENOUS
  Administered 2013-03-11: 2 mg via INTRAVENOUS

## 2013-03-11 MED ORDER — FENTANYL CITRATE 0.05 MG/ML IJ SOLN
INTRAMUSCULAR | Status: AC
  Filled 2013-03-11: qty 4

## 2013-03-11 MED ORDER — MIDAZOLAM HCL 5 MG/5ML IJ SOLN
INTRAMUSCULAR | Status: AC
  Filled 2013-03-11: qty 10

## 2013-03-11 MED ORDER — PROMETHAZINE HCL 25 MG/ML IJ SOLN
12.5000 mg | Freq: Four times a day (QID) | INTRAMUSCULAR | Status: DC | PRN
  Administered 2013-03-11: 12.5 mg via INTRAVENOUS

## 2013-03-11 MED ORDER — SODIUM CHLORIDE 0.9 % IJ SOLN
INTRAMUSCULAR | Status: AC
  Filled 2013-03-11: qty 10

## 2013-03-11 MED ORDER — PREDNISONE 20 MG PO TABS: 40.0000 mg | ORAL_TABLET | Freq: Every day | ORAL | Status: DC

## 2013-03-11 NOTE — Progress Notes (Signed)
[name]  Jeffrey Trujillo ZOX:[id number] DOB: 1951-04-22 DOA: 03/10/2013 PCP: [name], MD  Assessment/Plan: Melena:Possibly NSAID induced. The patient was recently taking indomethacin for gout up until a few weeks ago. Patient with history of diverticulosis and diverticular bleed in January of 2014. He did have a colonoscopy at that time. Related to diverticulitis with small perforation per GI.  Scheduled for EGD today. Appreciate GI assistance. Continue IV fluids for gentle hydration. Continue PPI BID.  Tolerated full liquids during night. Continued with frequent dark stools. Continue pain medicine for his abdominal pain and Zofran for any nausea that he may have.   Abdominal abscess: Per CT. Patient is remains afebrile and nontoxic appearing. Will continue Zosyn empirically day #2. Await general surgical consult for any recommendations.   Abdominal pain: On exam, his abdomen is soft and minimally tender. His discomfort is much less now. Likely related to #1 provide pain medicine on an as-needed basis.   Acute on chronic renal insufficiency:Trending up today. Etiology likely multifactorial including renal cancer, medications as well as #1. Continue with gentle hydratation with IV fluids. Will monitor closely.   Anemia: Await todays cbc. Yesterdays HG of 11.2 close to documented baseline. Has had several dark stools during night.  Will continue serial CBCs to monitor hemoglobin. But I do not anticipate the patient needing transfusion at this time.   Renal cell cancer: Patient last seen by Dr. [name] on 03/05/2013. Patient was diagnosed with right kidney cancer in 2010. He had a radical nephrectomy in 2010. In 2013 a left kidney mass appeared as well as a pancreatic lesion. In December of 2013 Votrient was started at 800 mg daily. In March of 2014 this treatment was held due to an increase in LFTs. In April of 2014 this medication was resumed at 400 mg daily. He  currently takes 400 mg daily. We will hold this for now. Patient is scheduled for his next CT July 2014 .   Barrett's esophagus: Last biopsy 2011. In January of this year patient was hospitalized for lower GI bleed diverticular in origin and requiring a colonoscopy with clips. At that time an EGD was performed and yielded antral erosions and Barrett's. No biopsy was performed at that time. Patient was recently seen by GI. The timing of repeat EGD for biopsies has yet to be determined. PPI recommended indefinitely. Pt reports being off of PPI for 1 month as he ran out.    Leukopenia: Likely related to renal cell cancer treatment. Trending up yesterday afternoon. Await today's cbc.   Thrombocytopenia: Likely related to renal cell cancer treatment. Monitor closely.   Gout flare: Left great toe area mildly erythemic, swollen and very tender to touch. Increased swelling today.  He reports that due to his renal cell cancer he is only allowed to take prednisone for these flareups. Will provide analgesics for now. Consider prednisone after EGD and discussion with GI.  ETOH abuse:The patient reports that his last alcoholic beverage was approximately one week ago. He drinks approximately a half a pint of vodka daily most days. He denies previous history of alcohol withdrawal syndrome or DTs. He is on long periods of time without drinking alcohol on multiple occasions. His wife endorses this. Will start thiamine IV/by mouth for several days and when necessary Ativan just in case.   Bradycardia. EKG yields SB with septal infarct not present 1/14. TSH pending. No chest pain.     1. *  Code Status: full Family Communication: wife  at bedside Disposition Plan: home when ready   Consultants:  GI  General Surgery  Procedures:  EGD 03/11/13   Antibiotics:  Zosyn 6/24>>  HPI/Subjective: Reports increased pain left great toe. Requesting prednisone. Reports frequent dark loose stool during night. No  BRBPR. Abdominal pain with BM.  Objective: Filed Vitals:   03/10/13 1200 03/10/13 1309 03/10/13 2254 03/11/13 0500  BP: 137/71 162/84 137/74 133/72  Pulse: 54 54 51 53  Temp: 99.1 F (37.3 C) 98.4 F (36.9 C) 97.9 F (36.6 C) 97.3 F (36.3 C)  TempSrc: Oral Oral Oral Oral  Resp: 13 14 20 18   Height:  5\' 11"  (1.803 m)    Weight:  78.4 kg (172 lb 13.5 oz)    SpO2:  96% 99% 100%    Intake/Output Summary (Last 24 hours) at 03/11/13 0844 Last data filed at 03/11/13 0600  Gross per 24 hour  Intake 1457.5 ml  Output    600 ml  Net  857.5 ml   Filed Weights   03/10/13 0651 03/10/13 1309  Weight: 80.74 kg (178 lb) 78.4 kg (172 lb 13.5 oz)    Exam:   General:  Well nourished NAD  Cardiovascular: Bradycardia but regular, No MGR No LE edema  Respiratory: normal effort BS clear bilaterally no wheeze no rhonchi  Abdomen: soft hyperactive BS. Mild tenderness to palpation diffusely.  Musculoskeletal: no clubbing no cyanosis   Data Reviewed: Basic Metabolic Panel:  Recent Labs Lab 03/05/13 1410 03/10/13 0732 03/11/13 0544  NA 139 138 137  K 3.8 4.1 4.1  CL 104 101 101  CO2 26 28 30   GLUCOSE 119* 107* 90  BUN 19.1 17 11   CREATININE 1.4* 1.38* 1.45*  CALCIUM 9.2 9.7 8.9   Liver Function Tests:  Recent Labs Lab 03/05/13 1410 03/10/13 0732 03/11/13 0544  AST 26 26 23   ALT 15 16 14   ALKPHOS 87 81 64  BILITOT 2.36* 1.3* 1.7*  PROT 7.5 7.5 7.0  ALBUMIN 3.5 3.5 3.2*    Recent Labs Lab 03/10/13 0732  LIPASE 46   No results found for this basename: AMMONIA,  in the last 168 hours CBC:  Recent Labs Lab 03/05/13 1410 03/10/13 0732 03/10/13 1616  WBC 3.5* 3.9* 4.1  NEUTROABS 2.6 3.0  --   HGB 11.7* 11.0* 11.2*  HCT 33.2* 29.5* 31.5*  MCV 98.2* 101.0* 101.0*  PLT 82 confirmed both analyzers* 108* 103*   Cardiac Enzymes: No results found for this basename: CKTOTAL, CKMB, CKMBINDEX, TROPONINI,  in the last 168 hours BNP (last 3 results) No results  found for this basename: PROBNP,  in the last 8760 hours CBG: No results found for this basename: GLUCAP,  in the last 168 hours  Recent Results (from the past 240 hour(s))  CLOSTRIDIUM DIFFICILE BY PCR     Status: None   Collection Time    03/10/13 10:00 AM      Result Value Range Status   C difficile by pcr NEGATIVE  NEGATIVE Final     Studies: Ct Abdomen Pelvis W Contrast  03/10/2013   *RADIOLOGY REPORT*  Clinical Data: Lower abdominal pain. Melena.  Metastatic renal cell carcinoma.  CT ABDOMEN AND PELVIS WITH CONTRAST  Technique:  Multidetector CT imaging of the abdomen and pelvis was performed following the standard protocol during bolus administration of intravenous contrast.  Contrast: 50mL OMNIPAQUE IOHEXOL 300 MG/ML  SOLN, OMNIPAQUE IOHEXOL 300 MG/ML  SOLN  Comparison: 12/29/2012  Findings: The patient has developed a 2 cm  abscess containing fluid and a tiny bubble of air in the pelvic cul-de-sac.  There is slight mucosal thickening and numerous diverticula in the distal colon as well as an area of mucosal thickening in the anterior wall of the distal sigmoid.  The metastatic lesions in the left kidney, pancreatic head, and right paraspinal musculature are appreciably less prominent than on the prior study.  Liver, spleen, adrenal glands, and abdominal aorta are normal. There is no free air. No acute osseous abnormality.  IMPRESSION: New small abscess in the pelvic cul-de-sac.  This could be originating from diverticula in the colon although there is a mucosal thickening of the adjacent distal sigmoid which could represent a mass although I think this is less likely.   Original Report Authenticated By: Francene Boyers, M.D.    Scheduled Meds: . pantoprazole  40 mg Oral BID AC  . piperacillin-tazobactam (ZOSYN)  IV  3.375 g Intravenous Q8H  . sodium chloride  3 mL Intravenous Q12H   Continuous Infusions: . sodium chloride 75 mL/hr at 03/11/13 0129    Principal Problem:    Melena Active Problems:   Acute renal insufficiency   Renal cell cancer   Gout flare   Barrett's esophagus   Abdominal pain   Leukopenia   Thrombocytopenia   Abdominal abscess   Anemia   ETOH abuse   Bradycardia    Time spent: 30 minutes    [facility]  [facility] Pager [phone]. If 7PM-7AM, please contact night-coverage at [email or web address], password [facility] 03/11/2013, 8:44 AM  LOS: 1 day

## 2013-03-11 NOTE — Op Note (Signed)
[facility] [address] [place], [address]   ENDOSCOPY PROCEDURE REPORT  PATIENT: Jeffrey Trujillo, Jeffrey Trujillo  MR#: [id number] BIRTHDATE: 1951-06-23 , 61  yrs. old GENDER: Male  ENDOSCOPIST: Jonette Eva, MD REFERRED MV:HQION Clelia Croft, M.D.  [name], M.D. PROCEDURE DATE: 03/11/2013 PROCEDURE:   EGD w/ biopsy  INDICATIONS:MELENA ON VOTRIENT. MEDICATIONS: Fentanyl 25 mcg IV, Versed 3 mg IV, and PREOP: Promethazine (Phenergan) 12.5mg  IV TOPICAL ANESTHETIC:   Cetacaine Spray  DESCRIPTION OF PROCEDURE:     Physical exam was performed.  Informed consent was obtained from the patient after explaining the benefits, risks, and alternatives to the procedure.  The patient was connected to the monitor and placed in the left lateral position.  Continuous oxygen was provided by nasal cannula and IV medicine administered through an indwelling cannula.  After administration of sedation, the patients esophagus was intubated and the EG-[id number] ([id number])  endoscope was advanced under direct visualization to the second portion of the duodenum.  The scope was removed slowly by carefully examining the color, texture, anatomy, and integrity of the mucosa on the way out.  The patient was recovered in endoscopy and discharged home in satisfactory condition.   ESOPHAGUS: There was evidence of Barrett's esophagus.   STOMACH: A single ulcer measuring 3 x 4mm in size was found in the gastric antrum.   Moderate chronic gastritis (inflammation) was found in the gastric antrum.  Multiple biopsies (2) were performed. Multiple biopsies were performed using cold forceps. PT ACTIVELY OOZED AND CAUTERY APPLIED (200/25). MILD DUODENITIS. NL SECOND PORTION OF THE DUODENUM.  COMPLICATIONS:   None  ENDOSCOPIC IMPRESSION: 1.   Barrett's esophagus 2.   Single ulcer in the gastric antrum-PROBABLE SOURCE FOR BLACK STOOL 3.   Chronic gastritis in the gastric antrum  RECOMMENDATIONS: AWAIT  BIOPSY BID PPI 30 MINS PRIOR TO MEALS FOR 3 MOS THEN ONCE DAILY FOREVER HOLD VOTRIENT FOR 2 WEEKS.  DISCUSSED WITH DR. SHADAD. FOLLOW UP WITH DR.  SHADAD & DR.  Jena Gauss IN 4-6 WEEKS.   REPEAT EXAM:   _______________________________ Rosalie DoctorJonette Eva, MD 03/11/2013 1:01 PM

## 2013-03-11 NOTE — Progress Notes (Signed)
Patient complain of gout pain to left big toe. Dr. Irene Limbo notified.

## 2013-03-11 NOTE — Consult Note (Signed)
  Patient currently sedated for procedure. Planned upper endoscopy. Patient's films and labs reviewed. Chart reviewed. Formal consult to follow once patient recovers from procedure.  Suspect diverticular abscess (micro perf) from acute diverticulitis.  Continue antibiotic coverage.  Continue to monitor response.

## 2013-03-11 NOTE — Progress Notes (Signed)
Patient seen, independently examined and chart reviewed. I agree with exam, assessment and plan discussed with Toya Smothers, NP.  Interval history/Subjective: Left great toe pain.  Objective: Afebrile, vitals stable.  Labs/studies: Creatinine stable 1.45. Hemoglobin stable 10.9. Platelets stable 110. TSH mildly elevated at 1319. C. difficile negative. Stool studies pending.  Assessment:  Melena, likely NSAID induced, single ulcer in the gastric antrum seen.  Diverticular abscess: Appears nontoxic. Continue antibiotics. Followup surgery consult.  Chronic kidney disease anemia, appears stable, acute blood loss is doubted.  Plan:  Continue treatment as per gastroenterology, empiric antibiotics and followup per general surgery  Treat gout with prednisone, discussed with Dr. Alice Reichert, MD [facility] [phone]

## 2013-03-11 NOTE — Progress Notes (Signed)
Spoke with patient briefly today; he is scheduled for an EGD with Dr. Darrick Penna. States he and his wife contacted our office regarding continuing Protonix and further refills. Appears refills were provided May 28/29 for supply of 30 with 11 refills. Patient and wife state the pharmacy did not make them aware.   As separate issue, patient also dealing with gout flare per hospitalist NP. Await findings of EGD first prior to therapy. Question of Prednisone; further recommendations will be made after EGD.

## 2013-03-11 NOTE — H&P (View-Only) (Signed)
Referring Provider: No ref. provider found Primary Care Physician:  [name], MD Primary Gastroenterologist:  [name]  Reason for Consultation:  BLACK STOOL  HPI:  PT PMHx: RCC ON VOTRIENT. LAST SEEN APR 2014. WAS TO CONTINUE ON PPI INDEFINITELY BUT RAN OUT FOR > 1 MO. TAKING INDOMETHACIN Q8H FOR 3-7 DAYS INTERMITTENTLY FOR GOUT. DRINKING LIQUOR. PRESENTED WITH LOWER ABD PAIN AND WATERY STOOLS. SUBJECTIVE CHILLS. NO FEVER. REPORTS BLACK WATERY STOOLS. CT IN ED SHOWED DIVERTICULITIS/?ABSCESS. C/O LOWER ABD PAIN. LAST EGD/TCS JAN 2014: FELT OVERSEDATED. C/O PAIN IN LEFT GREAT TOE.  PT DENIES BRBPR, nausea, vomiting, melena, OR heartburn or indigestion.   Past Medical History  Diagnosis Date  . Gout   . DJD (degenerative joint disease) of cervical spine   . Diverticulosis of colon     by colonoscopy 11/02/2009  . Barrett's esophagus     by EGD 11/02/2009  . Cancer     "was told has cancer on other kidney and spot on pancreas"  . 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 Anemia    Past Surgical History  Procedure Laterality Date  . Nephrectomy      right due to cancer  . Lung removal, partial      left lower lobe, benign  . Eus  08/07/2012    Procedure: UPPER ENDOSCOPIC ULTRASOUND (EUS) LINEAR;  Surgeon: Rachael Fee, MD;  Location: [facility] ENDOSCOPY;  Service: Endoscopy;  Laterality: N/A;  . Inguinal hernia repair    . Hernia repair    . Inguinal hernia repair      Right  . Eus  08/21/2012    Procedure: UPPER ENDOSCOPIC ULTRASOUND (EUS) LINEAR;  Surgeon: Rachael Fee, MD;  Location: [facility] ENDOSCOPY;  Service: Endoscopy;  Laterality: N/A;  . Colonoscopy  Feb 2011    [name]: single anal papilla, pan-colonic diverticula  . Esophagogastroduodenoscopy  Feb 2011    [name]: salmon-colored epithelium consistent with Barrett's, nodular antral erosions, small hiatal hernia, negative H.pylori  . Colonoscopy  09/25/2012    RMR: Colonic diverticulosis. Suspect diverticular  bleeding-hepatic flexure tic-sealed  . Esophagogastroduodenoscopy  09/25/2012    ZOX:WRUEAVW'U esophagus. Small hiatal hernia. Antral erosions of doubtful clinical significance, no biopsies    Prior to Admission medications   Medication Sig Start Date End Date Taking? Authorizing Provider  indomethacin (INDOCIN) 50 MG capsule Take 50 mg by mouth every 6 (six) hours as needed (gout).  02/28/13  Yes Historical Provider, MD  pazopanib (VOTRIENT) 200 MG tablet Take 400 mg by mouth daily before breakfast. Take two tablets on an empty stomach. 02/03/13  Yes Benjiman Core, MD  topiramate (TOPAMAX) 25 MG tablet Take 25 mg by mouth 2 (two) times daily.   Yes Historical Provider, MD    Current Facility-Administered Medications  Medication Dose Route Frequency Provider Last Rate Last Dose  . 0.9 %  sodium chloride infusion   Intravenous Continuous Gwenyth Bender, NP 75 mL/hr at 03/10/13 1154    . acetaminophen (TYLENOL) tablet 650 mg  650 mg Oral Q6H PRN Gwenyth Bender, NP       Or  . acetaminophen (TYLENOL) suppository 650 mg  650 mg Rectal Q6H PRN Gwenyth Bender, NP      . HYDROmorphone (DILAUDID) injection 1 mg  1 mg Intravenous Q4H PRN Shelda Jakes, MD      . ondansetron [facility]) injection 4 mg  4 mg Intravenous Q8H PRN Shelda Jakes, MD      .  ondansetron (ZOFRAN) tablet 4 mg  4 mg Oral Q6H PRN Gwenyth Bender, NP       Or  . ondansetron [facility]) injection 4 mg  4 mg Intravenous Q6H PRN Lesle Chris Black, NP      . pantoprazole (PROTONIX) injection 40 mg  40 mg Intravenous Q12H Elliot Cousin, MD      . piperacillin-tazobactam (ZOSYN) IVPB 3.375 g  3.375 g Intravenous Q8H Shelda Jakes, MD      . sodium chloride 0.9 % injection 3 mL  3 mL Intravenous Q12H Gwenyth Bender, NP        Allergies as of 03/10/2013 - Review Complete 03/10/2013  Allergen Reaction Noted  . Nsaids Other (See Comments) 06/05/2011  . Meperidine hcl Hives and Rash     Family History:  Colon Cancer  negative                            Polyps  negative   History   Social History  . Marital Status: Married    Spouse Name: N/A    Number of Children: N/A  . Years of Education: N/A   Occupational History  .      textiles in [place]    Social History Main Topics  . Smoking status: Current Some Day Smoker    Types: Cigars  . Smokeless tobacco: Never Used     Comment: occasional cigar  . Alcohol Use: Yes     Comment: vodka and budweiser, sometimes daily, depends on notion. couple drinks  . Drug Use: No  . Sexually Active: Yes   Review of Systems: PER HPI OTHERWISE ALL SYSTEMS ARE NEGATIVE.  Vitals: Blood pressure 162/84, pulse 54, temperature 98.4 F (36.9 C), temperature source Oral, resp. rate 14, height 5\' 11"  (1.803 m), weight 172 lb 13.5 oz (78.4 kg), SpO2 96.00%.  Physical Exam: General:   Alert,  Well-developed, well-nourished, pleasant and cooperative, MILD DISTRESS Head:  Normocephalic and atraumatic. Eyes:  Sclera clear, no icterus.   Conjunctiva pink. Mouth:  No deformity or lesions, dentition ABnormal. Neck:  Supple; no masses  Lungs:  Clear throughout to auscultation.   No wheezes. No acute distress. Heart:  Regular rate and rhythm; no murmurs Abdomen:  Soft, nontender and nondistended. Normal bowel sounds, without guarding, and without rebound.   Msk:  Symmetrical with gross deformities. Normal posture. Extremities:  Without edema. BILS SCDs IN PLACE. Neurologic:  Alert and  oriented x4;  grossly normal neurologically. Cervical Nodes:  No significant cervical adenopathy. Psych:  Alert and cooperative. Normal mood and FLAT affect.  Lab Results:  Recent Labs  03/10/13 0732  WBC 3.9*  HGB 11.0*  HCT 29.5*  PLT 108*   BMET  Recent Labs  03/10/13 0732  NA 138  K 4.1  CL 101  CO2 28  GLUCOSE 107*  BUN 17  CREATININE 1.38*  CALCIUM 9.7   LFT  Recent Labs  03/10/13 0732  PROT 7.5  ALBUMIN 3.5  AST 26  ALT 16  ALKPHOS 81  BILITOT 1.3*      Studies/Results: CT ABD/PELVIS W/ IVC-JUN 24- I PERSONALLY REVIEWED CT WITH DR. Tyron Russell. SML FLUID COLLECTION BETWEEN SIGMOID COLON/RECTUM, GASTRIC WALL NL  Impression: ADMITTED WITH DIVERTICULITIS WITH SML PERFORATION & BLACK WATERY STOOLS-? MELENA. RISK FACTORS: LOW PLTS. VOTRIENT, ETOH, NSAIDS W/O PPI, RCC METS TO PANCREAS. DIFFERENTIAL DIAGNOSIS INCLUDES PUD, DIEULAFOY'S LESION, ETOH GASTRITIS, LESS LIKELY HEMOSUCCOUS PANCREATICUS.  Plan: 1.  EGD JUN 25 TO ASSESS MELENA. WILL USE FENATNYL. PHENERGAN 12. 5 MG IV IN PRE-OP. 2. HOLD VOTRIENT. 3. FULL LIQUID DIET 4. BID PPI 5. 10 DAYS ABX 6. NO NEED FOR REPEAT TCS AT THIS TIME.   LOS: 0 days   [facility]  03/10/2013, 3:19 PM

## 2013-03-11 NOTE — Interval H&P Note (Signed)
History and Physical Interval Note:  03/11/2013 10:58 AM  Jeffrey Trujillo  has presented today for surgery, with the diagnosis of MELENA  The various methods of treatment have been discussed with the patient and family. After consideration of risks, benefits and other options for treatment, the patient has consented to  Procedure(s): ESOPHAGOGASTRODUODENOSCOPY (EGD) (N/A) as a surgical intervention .  The patient's history has been reviewed, patient examined, no change in status, stable for surgery.  I have reviewed the patient's chart and labs.  Questions were answered to the patient's satisfaction.     [employer]

## 2013-03-11 NOTE — Progress Notes (Signed)
Utilization Review Complete  

## 2013-03-12 DIAGNOSIS — K5732 Diverticulitis of large intestine without perforation or abscess without bleeding: Secondary | ICD-10-CM

## 2013-03-12 DIAGNOSIS — K630 Abscess of intestine: Secondary | ICD-10-CM

## 2013-03-12 DIAGNOSIS — K274 Chronic or unspecified peptic ulcer, site unspecified, with hemorrhage: Secondary | ICD-10-CM

## 2013-03-12 LAB — CBC
Hemoglobin: 10.8 g/dL — ABNORMAL LOW (ref 13.0–17.0)
MCH: 36.1 pg — ABNORMAL HIGH (ref 26.0–34.0)
Platelets: 100 10*3/uL — ABNORMAL LOW (ref 150–400)
RBC: 2.99 MIL/uL — ABNORMAL LOW (ref 4.22–5.81)
WBC: 3.8 10*3/uL — ABNORMAL LOW (ref 4.0–10.5)

## 2013-03-12 LAB — BASIC METABOLIC PANEL
Calcium: 8.8 mg/dL (ref 8.4–10.5)
GFR calc Af Amer: 64 mL/min — ABNORMAL LOW (ref >90)
GFR calc non Af Amer: 56 mL/min — ABNORMAL LOW (ref >90)
Glucose, Bld: 158 mg/dL — ABNORMAL HIGH (ref 70–99)
Potassium: 4.4 mEq/L (ref 3.5–5.1)
Sodium: 135 mEq/L (ref 135–145)

## 2013-03-12 MED ORDER — CIPROFLOXACIN HCL 500 MG PO TABS: 500.0000 mg | ORAL_TABLET | Freq: Two times a day (BID) | ORAL | Status: DC

## 2013-03-12 MED ORDER — METRONIDAZOLE 500 MG PO TABS: 500.0000 mg | ORAL_TABLET | Freq: Four times a day (QID) | ORAL | Status: DC

## 2013-03-12 MED ORDER — PANTOPRAZOLE SODIUM 40 MG PO TBEC: 40.0000 mg | DELAYED_RELEASE_TABLET | Freq: Two times a day (BID) | ORAL | Status: DC

## 2013-03-12 MED ORDER — HYDROCODONE-ACETAMINOPHEN 5-325 MG PO TABS: 2.0000 | ORAL_TABLET | Freq: Four times a day (QID) | ORAL | Status: DC | PRN

## 2013-03-12 MED ORDER — HYDROCODONE-ACETAMINOPHEN 5-325 MG PO TABS
2.0000 | ORAL_TABLET | Freq: Four times a day (QID) | ORAL | Status: DC | PRN
  Administered 2013-03-12: 2 via ORAL
  Filled 2013-03-12: qty 2

## 2013-03-12 MED ORDER — PREDNISONE 20 MG PO TABS: 40.0000 mg | ORAL_TABLET | Freq: Every day | ORAL | Status: DC

## 2013-03-12 NOTE — Discharge Summary (Signed)
Patient seen and examined. Agree with discharge as per Ms. Black.  Patient feels better. Eating fine. No significant abdominal pain. Doubt pain is significantly improved.  Afebrile, vitals stable. Appears well. Cardio vascular regular rate and rhythm. Respiratory clear to auscultation bilaterally. Abdomen soft, nontender, nondistended. Bilateral feet appear unremarkable.   Kidney function has improved to baseline. Hemoglobin and thrombocytopenia stable.  Discussed with Dr. Roxine Caddy indications for surgery. Continue antibiotics, followup as an outpatient. No intervention is anticipated.  Discussed with patient warning signs including fever, increased pain or worsening of condition.  Brendia Sacks, MD [facility] [phone]

## 2013-03-12 NOTE — Progress Notes (Signed)
Subjective:  This is a late entry. Patient seen at 945am this morning. States abdominal pain present but stable/improved (LLQ). Tolerating meals. Several loose stools but no longer black.   Objective: Vital signs in last 24 hours: Temp:  [97.4 F (36.3 C)-98.4 F (36.9 C)] 97.4 F (36.3 C) (06/26 1332) Pulse Rate:  [43-58] 43 (06/26 1332) Resp:  [18] 18 (06/26 1332) BP: (137-164)/(64-84) 155/64 mmHg (06/26 1332) SpO2:  [98 %-99 %] 99 % (06/26 0512) Last BM Date: 03/12/13 General:   Alert,  Well-developed, well-nourished, pleasant and cooperative in NAD Head:  Normocephalic and atraumatic. Eyes:  Sclera clear, no icterus.   Abdomen:  Soft, nontender and nondistended.  Normal bowel sounds, without guarding, and without rebound.   Extremities:  Without clubbing, deformity or edema. Neurologic:  Alert and  oriented x4;  grossly normal neurologically. Skin:  Intact without significant lesions or rashes. Psych:  Alert and cooperative. Normal mood and affect.  Intake/Output from previous day: 06/25 0701 - 06/26 0700 In: 1950 [I.V.:1800; IV Piggyback:150] Out: 1300 [Urine:1300] Intake/Output this shift: Total I/O In: 120 [P.O.:120] Out: 500 [Urine:500]  Lab Results: CBC  Recent Labs  03/10/13 1616 03/11/13 0545 03/12/13 0604  WBC 4.1 2.9* 3.8*  HGB 11.2* 10.9* 10.8*  HCT 31.5* 30.7* 29.9*  MCV 101.0* 101.7* 100.0  PLT 103* 110* 100*   BMET  Recent Labs  03/10/13 0732 03/11/13 0544 03/12/13 0604  NA 138 137 135  K 4.1 4.1 4.4  CL 101 101 99  CO2 28 30 28   GLUCOSE 107* 90 158*  BUN 17 11 12   CREATININE 1.38* 1.45* 1.34  CALCIUM 9.7 8.9 8.8   LFTs  Recent Labs  03/10/13 0732 03/11/13 0544  BILITOT 1.3* 1.7*  ALKPHOS 81 64  AST 26 23  ALT 16 14  PROT 7.5 7.0  ALBUMIN 3.5 3.2*    Recent Labs  03/10/13 0732  LIPASE 46   PT/INR No results found for this basename: LABPROT, INR,  in the last 72 hours    Imaging Studies: Ct Abdomen Pelvis W  Contrast  03/10/2013   *RADIOLOGY REPORT*  Clinical Data: Lower abdominal pain. Melena.  Metastatic renal cell carcinoma.  CT ABDOMEN AND PELVIS WITH CONTRAST  Technique:  Multidetector CT imaging of the abdomen and pelvis was performed following the standard protocol during bolus administration of intravenous contrast.  Contrast: 50mL OMNIPAQUE IOHEXOL 300 MG/ML  SOLN, OMNIPAQUE IOHEXOL 300 MG/ML  SOLN  Comparison: 12/29/2012  Findings: The patient has developed a 2 cm abscess containing fluid and a tiny bubble of air in the pelvic cul-de-sac.  There is slight mucosal thickening and numerous diverticula in the distal colon as well as an area of mucosal thickening in the anterior wall of the distal sigmoid.  The metastatic lesions in the left kidney, pancreatic head, and right paraspinal musculature are appreciably less prominent than on the prior study.  Liver, spleen, adrenal glands, and abdominal aorta are normal. There is no free air. No acute osseous abnormality.  IMPRESSION: New small abscess in the pelvic cul-de-sac.  This could be originating from diverticula in the colon although there is a mucosal thickening of the adjacent distal sigmoid which could represent a mass although I think this is less likely.   Original Report Authenticated By: Francene Boyers, M.D.  [2 weeks]   Assessment: 62 y/o male admitted with diverticulitis with small perforation/abscess and black watery stools. Votrient, ETOH, NSAIDs use without PPI. H/O renal cell carcinoma with mets to  pancreas. EGD showed known Barrett's esophagus, single ulcer in gastric antrum, chronic gastritis in antrum. His H/H has been stable. Stools are no longer black. Dr. Leticia Penna agrees with antibiotic therapy for diverticulitis/abscess. Suspect stools will firm up with resolving GI bleed but could also be secondary to antibiotic use. If worsens, then check for C.Diff.  Plan: 1. PPI twice a day for 3 months then once daily forever. 2. Hold  Votrient for 2 weeks. Follow up with [name]. 3. Complete 10 day course of Cipro/Flagyl as planned. 4. F/U with [name] in 4-6 weeks.  5. F/u biospies.    LOS: 2 days   Tana Coast  03/12/2013, 4:17 PM

## 2013-03-12 NOTE — Progress Notes (Signed)
REVIEWED.  

## 2013-03-12 NOTE — Consult Note (Signed)
Reason for Consult:Diverticulitis Referring Physician: Hospitalist  Jeffrey Trujillo is an 62 y.o. male.  HPI: Patient presented to [facility] with diarrhea and lower abdominal pain. Noted to have melena.  Admitted for management.  Since his admission, pain as slowly improved.  Some nausea.  No emesis.  Melena has improved.  EGD yesterday demonstrated PUD.    Past Medical History  Diagnosis Date  . Gout   . DJD (degenerative joint disease) of cervical spine   . Diverticulosis of colon     by colonoscopy 11/02/2009  . Barrett's esophagus     by EGD 11/02/2009  . Cancer     "was told has cancer on other kidney and spot on pancreas"  . 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 Anemia     Past Surgical History  Procedure Laterality Date  . Nephrectomy      right due to cancer  . Lung removal, partial      left lower lobe, benign  . Eus  08/07/2012    Procedure: UPPER ENDOSCOPIC ULTRASOUND (EUS) LINEAR;  Surgeon: Rachael Fee, MD;  Location: [facility];  Service: Endoscopy;  Laterality: N/A;  . Inguinal hernia repair    . Hernia repair    . Inguinal hernia repair      Right  . Eus  08/21/2012    Procedure: UPPER ENDOSCOPIC ULTRASOUND (EUS) LINEAR;  Surgeon: Rachael Fee, MD;  Location: [facility];  Service: Endoscopy;  Laterality: N/A;  . Colonoscopy  Feb 2011    [name]: single anal papilla, pan-colonic diverticula  . Esophagogastroduodenoscopy  Feb 2011    [name]: salmon-colored epithelium consistent with Barrett's, nodular antral erosions, small hiatal hernia, negative H.pylori  . Colonoscopy  09/25/2012    RMR: Colonic diverticulosis. Suspect diverticular bleeding-hepatic flexure tic-sealed  . Esophagogastroduodenoscopy  09/25/2012    ZOX:WRUEAVW'U esophagus. Small hiatal hernia. Antral erosions of doubtful clinical significance, no biopsies    Family History  Problem Relation Age of Onset  . Lung cancer Brother   . Prostate cancer Brother   . Colon cancer Neg Hx    . Lumbar disc disease Brother   . Prostate cancer Brother     Social History:  reports that he has been smoking Cigars.  He has never used smokeless tobacco. He reports that  drinks alcohol. He reports that he does not use illicit drugs.  Allergies:  Allergies  Allergen Reactions  . Nsaids Other (See Comments)    Patient only has one kidney.   . Meperidine Hcl Hives and Rash    Medications:  I have reviewed the patient's current medications. Prior to Admission:  Prescriptions prior to admission  Medication Sig Dispense Refill  . indomethacin (INDOCIN) 50 MG capsule Take 50 mg by mouth every 6 (six) hours as needed (gout).       . pazopanib (VOTRIENT) 200 MG tablet Take 400 mg by mouth daily before breakfast. Take two tablets on an empty stomach.      . topiramate (TOPAMAX) 25 MG tablet Take 25 mg by mouth 2 (two) times daily.       Scheduled: . pantoprazole  40 mg Oral BID AC  . piperacillin-tazobactam (ZOSYN)  IV  3.375 g Intravenous Q8H  . predniSONE  40 mg Oral Q breakfast  . sodium chloride  3 mL Intravenous Q12H   Continuous: . sodium chloride 75 mL/hr at 03/12/13 0450   JWJ:[id number], acetaminophen, HYDROmorphone (DILAUDID) injection, ondansetron (ZOFRAN) IV,  ondansetron Anti-infectives   Start     Dose/Rate Route Frequency Ordered Stop   03/10/13 1830  piperacillin-tazobactam (ZOSYN) IVPB 3.375 g     3.375 g 12.5 mL/hr over 240 Minutes Intravenous Every 8 hours 03/10/13 1156     03/10/13 1100  piperacillin-tazobactam (ZOSYN) IVPB 3.375 g     3.375 g 12.5 mL/hr over 240 Minutes Intravenous  Once 03/10/13 1047 03/10/13 1152      Results for orders placed during the hospital encounter of 03/10/13 (from the past 48 hour(s))  CBC     Status: Abnormal   Collection Time    03/10/13  4:16 PM      Result Value Range   WBC 4.1  4.0 - 10.5 K/uL   RBC 3.12 (*) 4.22 - 5.81 MIL/uL   Hemoglobin 11.2 (*) 13.0 - 17.0 g/dL   HCT 16.1 (*) 09.6 - 04.5 %   MCV 101.0 (*)  78.0 - 100.0 fL   MCH 35.9 (*) 26.0 - 34.0 pg   MCHC 35.6  30.0 - 36.0 g/dL   RDW 40.9 (*) 81.1 - 91.4 %   Platelets 103 (*) 150 - 400 K/uL   Comment: SPECIMEN CHECKED FOR CLOTS     PLATELET COUNT CONFIRMED BY SMEAR     PLATELETS APPEAR DECREASED  COMPREHENSIVE METABOLIC PANEL     Status: Abnormal   Collection Time    03/11/13  5:44 AM      Result Value Range   Sodium 137  135 - 145 mEq/L   Potassium 4.1  3.5 - 5.1 mEq/L   Chloride 101  96 - 112 mEq/L   CO2 30  19 - 32 mEq/L   Glucose, Bld 90  70 - 99 mg/dL   BUN 11  6 - 23 mg/dL   Creatinine, Ser 7.82 (*) 0.50 - 1.35 mg/dL   Calcium 8.9  8.4 - 95.6 mg/dL   Total Protein 7.0  6.0 - 8.3 g/dL   Albumin 3.2 (*) 3.5 - 5.2 g/dL   AST 23  0 - 37 U/L   ALT 14  0 - 53 U/L   Alkaline Phosphatase 64  39 - 117 U/L   Total Bilirubin 1.7 (*) 0.3 - 1.2 mg/dL   GFR calc non Af Amer 51 (*) >90 mL/min   GFR calc Af Amer 59 (*) >90 mL/min   Comment:            The eGFR has been calculated     using the CKD EPI equation.     This calculation has not been     validated in all clinical     situations.     eGFR's persistently     <90 mL/min signify     possible Chronic Kidney Disease.  TSH     Status: Abnormal   Collection Time    03/11/13  5:44 AM      Result Value Range   TSH 5.319 (*) 0.350 - 4.500 uIU/mL  VITAMIN B12     Status: None   Collection Time    03/11/13  5:44 AM      Result Value Range   Vitamin B-12 322  211 - 911 pg/mL  CBC     Status: Abnormal   Collection Time    03/11/13  5:45 AM      Result Value Range   WBC 2.9 (*) 4.0 - 10.5 K/uL   RBC 3.02 (*) 4.22 - 5.81 MIL/uL   Hemoglobin 10.9 (*) 13.0 -  17.0 g/dL   HCT 78.2 (*) 95.6 - 21.3 %   MCV 101.7 (*) 78.0 - 100.0 fL   MCH 36.1 (*) 26.0 - 34.0 pg   MCHC 35.5  30.0 - 36.0 g/dL   RDW 08.6 (*) 57.8 - 46.9 %   Platelets 110 (*) 150 - 400 K/uL   Comment: SPECIMEN CHECKED FOR CLOTS     PLATELET COUNT CONFIRMED BY SMEAR  BASIC METABOLIC PANEL     Status: Abnormal    Collection Time    03/12/13  6:04 AM      Result Value Range   Sodium 135  135 - 145 mEq/L   Potassium 4.4  3.5 - 5.1 mEq/L   Chloride 99  96 - 112 mEq/L   CO2 28  19 - 32 mEq/L   Glucose, Bld 158 (*) 70 - 99 mg/dL   BUN 12  6 - 23 mg/dL   Creatinine, Ser 6.29  0.50 - 1.35 mg/dL   Calcium 8.8  8.4 - 52.8 mg/dL   GFR calc non Af Amer 56 (*) >90 mL/min   GFR calc Af Amer 64 (*) >90 mL/min   Comment:            The eGFR has been calculated     using the CKD EPI equation.     This calculation has not been     validated in all clinical     situations.     eGFR's persistently     <90 mL/min signify     possible Chronic Kidney Disease.  CBC     Status: Abnormal   Collection Time    03/12/13  6:04 AM      Result Value Range   WBC 3.8 (*) 4.0 - 10.5 K/uL   RBC 2.99 (*) 4.22 - 5.81 MIL/uL   Hemoglobin 10.8 (*) 13.0 - 17.0 g/dL   HCT 41.3 (*) 24.4 - 01.0 %   MCV 100.0  78.0 - 100.0 fL   MCH 36.1 (*) 26.0 - 34.0 pg   MCHC 36.1 (*) 30.0 - 36.0 g/dL   RDW 27.2 (*) 53.6 - 64.4 %   Platelets 100 (*) 150 - 400 K/uL   Comment: SPECIMEN CHECKED FOR CLOTS     PLATELET COUNT CONFIRMED BY SMEAR    No results found.  Review of Systems  Constitutional: Negative for fever and chills.  HENT: Negative.   Eyes: Negative.   Respiratory: Negative.   Cardiovascular: Negative.   Gastrointestinal: Positive for heartburn, nausea, abdominal pain (lower), diarrhea and melena (improved).  Genitourinary: Negative.   Musculoskeletal: Negative.   Skin: Negative.   Neurological: Negative.   Endo/Heme/Allergies: Negative.   Psychiatric/Behavioral: Negative.    Blood pressure 164/84, pulse 50, temperature 97.5 F (36.4 C), temperature source Oral, resp. rate 18, height 5\' 11"  (1.803 m), weight 78.4 kg (172 lb 13.5 oz), SpO2 99.00%. Physical Exam  Constitutional: He is oriented to person, place, and time. He appears well-developed and well-nourished. No distress.  HENT:  Head: Normocephalic and  atraumatic.  Eyes: Conjunctivae and EOM are normal. Pupils are equal, round, and reactive to light. No scleral icterus.  Neck: Normal range of motion. Neck supple. No thyromegaly present.  Cardiovascular: Normal rate, regular rhythm and normal heart sounds.   Respiratory: Effort normal and breath sounds normal. No respiratory distress.  GI: Soft. Bowel sounds are normal. He exhibits no distension and no mass. There is tenderness (moderate suprapubic.  ). There is no rebound and no guarding.  Musculoskeletal: Normal range of motion.  Lymphadenopathy:    He has no cervical adenopathy.  Neurological: He is alert and oriented to person, place, and time.  Skin: Skin is warm and dry.    Assessment/Plan: PUD, GI bleed apparently resolving, Acute diverticulitis with localized small abscess.  COntinue abx.  Continue diet as tolerated.  No acute surgical indications.  Indications discussed with patient.  Will continue to follow.    Ashok Sawaya C 03/12/2013, 10:33 AM

## 2013-03-12 NOTE — Discharge Summary (Signed)
Physician Discharge Summary  Jeffrey Trujillo ZOX:[id number] DOB: 25-Apr-1951 DOA: 03/10/2013  PCP: [name], MD  Admit date: 03/10/2013 Discharge date: 03/12/2013  Time spent: 40  minutes Recommendations for Outpatient Follow-up:  1. Follow up with [name] 4 weeks 2. Follow up with [name] 4 weeks  Discharge Diagnoses:  Principal Problem:   Melena Active Problems:   Acute renal insufficiency   Renal cell cancer   Gout flare   Barrett's esophagus   Abdominal pain   Leukopenia   Thrombocytopenia   Abdominal abscess   Anemia   ETOH abuse   Bradycardia  Discharge Condition: stable  Diet recommendation: low residue  Filed Weights   03/10/13 0651 03/10/13 1309  Weight: 80.74 kg (178 lb) 78.4 kg (172 lb 13.5 oz)    History of present illness:  [name] is a is a very pleasant 62 y.o. male with past medical history that includes renal cell cancer, diverticulosis, Barrett's esophagus, gout, presented to the emergency department on 03/10/13 with the chief complaint of abdominal pain and diarrhea. He reported that 6 days prior to presentation he developed sudden abdominal pain in the lower quadrants.  At the onset he stated the pain was constant and sharp and he rated it a 6/10. He also stated at that time he had pain in his rectum. He also developed loose stools. He reported the stools were normal in color initially. He called his doctor who recommended a clear liquid diet. He stated he was able to tolerate clear liquids and the abdominal pain subsided as well as the diarrhea over the next 2 days. He stated he began to eat a regular diet and his stools were formed and he had no further abdominal pain. Then, 2 days prior to presentation he developed some nausea and had one episode of emesis that was clear. He denied any coffee ground emesis. The day before presentation, after work he developed sudden diarrhea. He stated that at that  time the abdominal pain was less than  before and he had little nausea. He stated the pain at that  time was intermittent and crampy-like and usually just before a bowel movement. He stated that once he had a bowel movement the pain subsided. He stated the diarrhea was very dark and tarry in nature. He indicated that he  had 5 or 6 episodes during the night and into the morning of presentation. He denied any bright red blood. He denied any shortness of breath lightheadedness syncope or near-syncope. He denied chest pain palpitations. He stated when the darkness of his stool persisted he decided to come to the emergency room. Of note, he had recently taken indomethacin for frequent gout flareups. However, he was recently advised to discontinue indomethacin because of his renal function. Lab work in the emergency room was significant for a white count of 3.9, hemoglobin of 11.0, platelets of 108, creatinine 1.38. Stool for C. difficile by PCR is negative. Vital signs were significant for a blood pressure of 165/73 and a heart rate of 54. CT of the abdomen yields a new small abscess in the pelvic cul-de-sac.   Hospital Course:  Melena: Possibly NSAID induced. EGD on 03/11/13 per Dr. Darrick Penna yields single ulcer in the gastric antrum. Dr. Darrick Penna opines this probable source for [name] stool. In addition, patient with history of diverticulosis and diverticular bleed in January of 2014. He did have a colonoscopy at that time. Continue PPI BID for 3 months and then daily forever per  Dr. Evelina Dun. At discharge stool is brown and patient is tolerating diet without difficulty.    Acute diverticulitis with small abscess: Per CT. Seen by surgery who opined no acute surgical intervention needed.  Patient remained afebrile and nontoxic appearing during hospitalization. He recieved  Zosyn for 3 days. Will transition to cipro and flagyl for total 14 days. Will follow up with surgery 4 weeks.    Abdominal pain: On admission his abdomen was soft and minimally tender. His  discomfort slowly improved and at discharge is minimal.  Likely related to #1 .   Acute on chronic renal insufficiency:. Etiology likely multifactorial including renal cancer, medications as well as #1. Provided with  gentle hydratation with IV fluids. At discharge creatinine 1.34 very close to baseline. Will follow up with PCP 1 week for trending   Anemia:  Normocytic. Remained stable close to baseline during this hospitalization.    Renal cell cancer: Patient last seen by Dr. [name] on 03/05/2013. Patient was diagnosed with right kidney cancer in 2010. He had a radical nephrectomy in 2010. In 2013 a left kidney mass appeared as well as a pancreatic lesion. In December of 2013 Votrient was started at 800 mg daily. In March of 2014 this treatment was held due to an increase in LFTs. In April of 2014 this medication was resumed at 400 mg daily. He currently takes 400 mg daily. This was held during this hospitalization. Dr. Evelina Dun discussed with [name] and patient is to hold Votrient for 2 weeks.  Patient is scheduled for his next CT July 2014 .Will follow up with [name] 4 weeks   Barrett's esophagus: Biopsies taken 03/11/13 during EGD.continue PPI BID for 3 months and then daily forever  .  Leukopenia: Likely related to renal cell cancer treatment. Stable   Thrombocytopenia: Likely related to renal cell cancer treatment. Stable   Gout flare: prednisone given s/p EGD and after discussion with Dr. Evelina Dun. Will be discharged with 6 days of prednisone  ETOH abuse:No s/sx withdrawal  Bradycardia. EKG yields SB with septal infarct not present 1/14. TSH 5.31. Pt had no chest pain. HR range 55-80. Recommend OP follow up with PCP to trend TSH     Procedures:  EGD 03/11/13  Consultations:  GI Dr. Evelina Dun  General surgery Dr. Leticia Penna  Discharge Exam: Filed Vitals:   03/11/13 1200 03/11/13 1351 03/11/13 2236 03/12/13 0512  BP: 108/74 135/78 137/74 164/84  Pulse: 63 70 58 50  Temp:   97.3 F (36.3 C) 98.4 F (36.9 C) 97.5 F (36.4 C)  TempSrc:   Oral Oral  Resp: 15 16 18 18   Height:      Weight:      SpO2: 100% 99% 98% 99%    General: well nourished NAD Cardiovascular: RRR No MGR No LE edema Respiratory: normal effort BS clear bilaterally no wheeze Abdomen: soft +BS non-tender to palpation  Discharge Instructions       Future Appointments Provider Department Dept Phone   04/07/2013 2:00 PM Krista Blue [facility] MED[facility] ON[facility] [phone]   04/07/2013 2:30 PM [facility] [phone]   Patient to arrive 15 minutes prior to appointment time. Patient to pick up oral contrast at least 1 day prior to exam, unless otherwise instructed by your physician. No solid food 4 hours prior to exam. Liquids and Medicines are okay.   04/09/2013 1:30 PM Benjiman Core, MD [facility] MEDICAL ONCOLOGY [phone]  Medication List    STOP taking these medications       indomethacin 50 MG capsule  Commonly known as:  INDOCIN     pazopanib 200 MG tablet  Commonly known as:  VOTRIENT      TAKE these medications       ciprofloxacin 500 MG tablet  Commonly known as:  CIPRO  Take 1 tablet (500 mg total) by mouth 2 (two) times daily.     HYDROcodone-acetaminophen 5-325 MG per tablet  Commonly known as:  NORCO/VICODIN  Take 2 tablets by mouth every 6 (six) hours as needed.     metroNIDAZOLE 500 MG tablet  Commonly known as:  FLAGYL  Take 1 tablet (500 mg total) by mouth every 6 (six) hours.     pantoprazole 40 MG tablet  Commonly known as:  PROTONIX  Take 1 tablet (40 mg total) by mouth 2 (two) times daily before a meal.     predniSONE 20 MG tablet  Commonly known as:  DELTASONE  Take 2 tablets (40 mg total) by mouth daily with breakfast.     topiramate 25 MG tablet  Commonly known as:  TOPAMAX  Take 25 mg by mouth 2 (two) times daily.       Allergies  Allergen Reactions  .  Nsaids Other (See Comments)    Patient only has one kidney.   . Meperidine Hcl Hives and Rash      The results of significant diagnostics from this hospitalization (including imaging, microbiology, ancillary and laboratory) are listed below for reference.    Significant Diagnostic Studies: Ct Abdomen Pelvis W Contrast  03/10/2013   *RADIOLOGY REPORT*  Clinical Data: Lower abdominal pain. Melena.  Metastatic renal cell carcinoma.  CT ABDOMEN AND PELVIS WITH CONTRAST  Technique:  Multidetector CT imaging of the abdomen and pelvis was performed following the standard protocol during bolus administration of intravenous contrast.  Contrast: 50mL OMNIPAQUE IOHEXOL 300 MG/ML  SOLN, OMNIPAQUE IOHEXOL 300 MG/ML  SOLN  Comparison: 12/29/2012  Findings: The patient has developed a 2 cm abscess containing fluid and a tiny bubble of air in the pelvic cul-de-sac.  There is slight mucosal thickening and numerous diverticula in the distal colon as well as an area of mucosal thickening in the anterior wall of the distal sigmoid.  The metastatic lesions in the left kidney, pancreatic head, and right paraspinal musculature are appreciably less prominent than on the prior study.  Liver, spleen, adrenal glands, and abdominal aorta are normal. There is no free air. No acute osseous abnormality.  IMPRESSION: New small abscess in the pelvic cul-de-sac.  This could be originating from diverticula in the colon although there is a mucosal thickening of the adjacent distal sigmoid which could represent a mass although I think this is less likely.   Original Report Authenticated By: Francene Boyers, M.D.    Microbiology: Recent Results (from the past 240 hour(s))  STOOL CULTURE     Status: None   Collection Time    03/10/13 10:00 AM      Result Value Range Status   Specimen Description STOOL   Final   Special Requests NONE   Final   Culture NO SUSPICIOUS COLONIES, CONTINUING TO HOLD   Final   Report Status PENDING    Incomplete  CLOSTRIDIUM DIFFICILE BY PCR     Status: None   Collection Time    03/10/13 10:00 AM      Result Value Range Status   C difficile by  pcr NEGATIVE  NEGATIVE Final  OVA AND PARASITE EXAMINATION     Status: None   Collection Time    03/10/13 10:00 AM      Result Value Range Status   Specimen Description STOOL   Final   Special Requests NONE   Final   Ova and parasites NO OVA OR PARASITES SEEN   Final   Report Status 03/11/2013 FINAL   Final     Labs: Basic Metabolic Panel:  Recent Labs Lab 03/05/13 1410 03/10/13 0732 03/11/13 0544 03/12/13 0604  NA 139 138 137 135  K 3.8 4.1 4.1 4.4  CL 104 101 101 99  CO2 26 28 30 28   GLUCOSE 119* 107* 90 158*  BUN 19.1 17 11 12   CREATININE 1.4* 1.38* 1.45* 1.34  CALCIUM 9.2 9.7 8.9 8.8   Liver Function Tests:  Recent Labs Lab 03/05/13 1410 03/10/13 0732 03/11/13 0544  AST 26 26 23   ALT 15 16 14   ALKPHOS 87 81 64  BILITOT 2.36* 1.3* 1.7*  PROT 7.5 7.5 7.0  ALBUMIN 3.5 3.5 3.2*    Recent Labs Lab 03/10/13 0732  LIPASE 46   No results found for this basename: AMMONIA,  in the last 168 hours CBC:  Recent Labs Lab 03/05/13 1410 03/10/13 0732 03/10/13 1616 03/11/13 0545 03/12/13 0604  WBC 3.5* 3.9* 4.1 2.9* 3.8*  NEUTROABS 2.6 3.0  --   --   --   HGB 11.7* 11.0* 11.2* 10.9* 10.8*  HCT 33.2* 29.5* 31.5* 30.7* 29.9*  MCV 98.2* 101.0* 101.0* 101.7* 100.0  PLT 82 confirmed both analyzers* 108* 103* 110* 100*   Cardiac Enzymes: No results found for this basename: CKTOTAL, CKMB, CKMBINDEX, TROPONINI,  in the last 168 hours BNP: BNP (last 3 results) No results found for this basename: PROBNP,  in the last 8760 hours CBG: No results found for this basename: GLUCAP,  in the last 168 hours     Signed:  Gwenyth Bender  [facility] 03/12/2013, 12:16 PM

## 2013-03-12 NOTE — Progress Notes (Signed)
Patient with orders to be discharge home. Discharge instructions given, patient verbalized understanding. Prescriptions given. Patient in stable condition upon discharge. Patient left with spouse in private vehicle.  

## 2013-03-12 NOTE — Care Management Note (Signed)
    Page 1 of 1   03/12/2013     11:27:36 AM   CARE MANAGEMENT NOTE 03/12/2013  Patient:  Jeffrey Trujillo, Jeffrey Trujillo   Account Number:  [id number]  Date Initiated:  03/12/2013  Documentation initiated by:  Rosemary Holms  Subjective/Objective Assessment:   Pt admitted form home where he lives with his wife. Independent with ADL. DC home with no identified HH needs.     Action/Plan:   Anticipated DC Date:  03/12/2013   Anticipated DC Plan:  HOME/SELF CARE      DC Planning Services  CM consult      Choice offered to / List presented to:             Status of service:  Completed, signed off Medicare Important Message given?   (If response is "NO", the following Medicare IM given date fields will be blank) Date Medicare IM given:   Date Additional Medicare IM given:    Discharge Disposition:  HOME/SELF CARE  Per UR Regulation:    If discussed at Long Length of Stay Meetings, dates discussed:    Comments:  03/12/13 Rosemary Holms RN BSN CM

## 2013-03-13 ENCOUNTER — Encounter (HOSPITAL_COMMUNITY): Payer: Self-pay | Admitting: Gastroenterology

## 2013-03-14 LAB — STOOL CULTURE

## 2013-03-18 ENCOUNTER — Telehealth: Payer: Self-pay | Admitting: Gastroenterology

## 2013-03-18 NOTE — Telephone Encounter (Signed)
Please call pt. His stomach Bx shows gastritis.   CONTINUE PROTONIX. TAKE 30 MINUTES PRIOR TO MEALS TWICE DAILY. HE SHOULD SPEAK WITH DR. SHADAD ABOUT RESTARTING THE VOTRIENT. IT WOULD BE OKAY TO RE-START IT ON JUL 9 IF THAT IS WHAT DR. SHADAD WANTS. HE NEEDS A REPEAT EGD IN 3 MOS. OPV JUL 23 WITH [name].

## 2013-03-19 NOTE — Telephone Encounter (Signed)
Cc PCP 

## 2013-03-19 NOTE — Telephone Encounter (Signed)
Please address with [name] when he returns.

## 2013-03-19 NOTE — Telephone Encounter (Signed)
Pt's wife is aware. She will call if he has problems or further questions. Routing to [employer] to address with RMR when he returns.

## 2013-03-19 NOTE — Telephone Encounter (Signed)
Tried to call with no answer  

## 2013-03-19 NOTE — Telephone Encounter (Signed)
Pt's wife called for resutls. ( She comes to appts with him). I informed her. She cancelled his appt on 03/09/13 because he has appt in [place] that day. He is rescheduled for 04/20/2013 at 3:00 PM with Gerrit Halls, NP.   She said he is still having the diarrhea after meals, but not dark in color. Please advise!

## 2013-03-23 ENCOUNTER — Telehealth: Payer: Self-pay | Admitting: *Deleted

## 2013-03-23 NOTE — Telephone Encounter (Signed)
Patient was discharged home 03-12-2013.  Was advised by GI doctors to resume Votrient on 03-25-2013 but to check with oncologist first. Ms. Amborn would like to know when to resume Votrient and can be reached at [phone].  Will notify providers.

## 2013-03-24 ENCOUNTER — Other Ambulatory Visit: Payer: Self-pay | Admitting: *Deleted

## 2013-03-24 DIAGNOSIS — C649 Malignant neoplasm of unspecified kidney, except renal pelvis: Secondary | ICD-10-CM

## 2013-03-24 MED ORDER — PAZOPANIB HCL 200 MG PO TABS: 400.0000 mg | ORAL_TABLET | Freq: Every day | ORAL | Status: DC

## 2013-03-24 NOTE — Telephone Encounter (Signed)
Called patient's wife Alona Bene with [name] orders to resume Votrient on 03-25-2013.  Alona Bene says he has a ten day supply om hand and will resume tomorrow.  Refill sent to [facility] at this time.

## 2013-03-31 NOTE — Telephone Encounter (Signed)
RECEIVED A FAX FROM [facility] CONCERNING A CONFIRMATION OF PRESCRIPTION SHIPMENT FOR VOTRIENT ON 03/30/13.

## 2013-04-07 ENCOUNTER — Other Ambulatory Visit (HOSPITAL_BASED_OUTPATIENT_CLINIC_OR_DEPARTMENT_OTHER): Payer: BC Managed Care – PPO | Admitting: Lab

## 2013-04-07 ENCOUNTER — Ambulatory Visit (HOSPITAL_COMMUNITY)
Admission: RE | Admit: 2013-04-07 | Discharge: 2013-04-07 | Disposition: A | Discharge status: Home / Self Care | Payer: BC Managed Care – PPO | Source: Ambulatory Visit | Attending: Oncology | Admitting: Oncology

## 2013-04-07 ENCOUNTER — Encounter (HOSPITAL_COMMUNITY): Payer: Self-pay

## 2013-04-07 DIAGNOSIS — M479 Spondylosis, unspecified: Secondary | ICD-10-CM | POA: Insufficient documentation

## 2013-04-07 DIAGNOSIS — C649 Malignant neoplasm of unspecified kidney, except renal pelvis: Secondary | ICD-10-CM

## 2013-04-07 DIAGNOSIS — K869 Disease of pancreas, unspecified: Secondary | ICD-10-CM | POA: Insufficient documentation

## 2013-04-07 DIAGNOSIS — K5730 Diverticulosis of large intestine without perforation or abscess without bleeding: Secondary | ICD-10-CM | POA: Insufficient documentation

## 2013-04-07 DIAGNOSIS — Z905 Acquired absence of kidney: Secondary | ICD-10-CM | POA: Insufficient documentation

## 2013-04-07 DIAGNOSIS — N281 Cyst of kidney, acquired: Secondary | ICD-10-CM | POA: Insufficient documentation

## 2013-04-07 LAB — COMPREHENSIVE METABOLIC PANEL (CC13)
CO2: 30 mEq/L — ABNORMAL HIGH (ref 22–29)
Calcium: 9.2 mg/dL (ref 8.4–10.4)
Chloride: 99 mEq/L (ref 98–109)
Glucose: 124 mg/dl (ref 70–140)
Sodium: 137 mEq/L (ref 136–145)
Total Bilirubin: 1.84 mg/dL — ABNORMAL HIGH (ref 0.20–1.20)
Total Protein: 7.3 g/dL (ref 6.4–8.3)

## 2013-04-07 LAB — CBC WITH DIFFERENTIAL/PLATELET
Eosinophils Absolute: 0 10*3/uL (ref 0.0–0.5)
HCT: 33.9 % — ABNORMAL LOW (ref 38.4–49.9)
LYMPH%: 6.3 % — ABNORMAL LOW (ref 14.0–49.0)
MONO#: 0.1 10*3/uL (ref 0.1–0.9)
NEUT#: 5 10*3/uL (ref 1.5–6.5)
NEUT%: 91.4 % — ABNORMAL HIGH (ref 39.0–75.0)
Platelets: 81 10*3/uL — ABNORMAL LOW (ref 140–400)
RBC: 3.26 10*6/uL — ABNORMAL LOW (ref 4.20–5.82)
WBC: 5.5 10*3/uL (ref 4.0–10.3)
lymph#: 0.3 10*3/uL — ABNORMAL LOW (ref 0.9–3.3)

## 2013-04-07 MED ORDER — IOHEXOL 300 MG/ML  SOLN
100.0000 mL | Freq: Once | INTRAMUSCULAR | Status: AC | PRN
  Administered 2013-04-07: 100 mL via INTRAVENOUS

## 2013-04-08 ENCOUNTER — Ambulatory Visit: Payer: BC Managed Care – PPO | Admitting: Gastroenterology

## 2013-04-09 ENCOUNTER — Telehealth: Payer: Self-pay | Admitting: Oncology

## 2013-04-09 ENCOUNTER — Ambulatory Visit (HOSPITAL_BASED_OUTPATIENT_CLINIC_OR_DEPARTMENT_OTHER): Payer: BC Managed Care – PPO | Admitting: Oncology

## 2013-04-09 VITALS — BP 176/87 | HR 61 | Temp 98.2°F | Resp 20 | Ht 71.0 in | Wt 172.6 lb

## 2013-04-09 DIAGNOSIS — C649 Malignant neoplasm of unspecified kidney, except renal pelvis: Secondary | ICD-10-CM

## 2013-04-09 MED ORDER — HYDROCODONE-ACETAMINOPHEN 5-325 MG PO TABS: 1.0000 | ORAL_TABLET | Freq: Four times a day (QID) | ORAL | Status: DC | PRN

## 2013-04-09 NOTE — Progress Notes (Signed)
Hematology and Oncology Follow Up Visit  Jeffrey Trujillo [id number] [birth year] 62 y.o.    Principle Diagnosis: 62 year old with the following issues:  1. T1b right kidney cancer diagnosed in 2010. He is rdical nephrectomy on June 27, 2009. The pathological staging was T1b, was clear cell histology with Fuhrman grade 3/4.  2. Left Kidney mass  Measuring 4.5 x 2 7 x 3.6 irregular enhancing appears to be a new kidney cancer noted in 07/2012. 3. Pancreatic lesion. 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  Current therapy: Votrient  800 mg daily started in December 2013. Treatment held in 11/2012 due to increase LFTs. Votrient was resumed in April 2014 at 400 mg daily.  Interim History: Jeffrey Trujillo presents for a follow up visit. He is a nice man with the above issues. Since his last visit, he has  Developed GI bleed and required an endoscopy and hound to gave a gastric ulcer that is likely NSAID induced. He resumed Votrient since 7/2 after his discharge. He has reported a pain in his big toe on the right and left. He had a blister that is healing . He is doing well otherwise with the Votrient. Denies chest pain, shortness of breath, abdominal pain, nausea, vomiting. No diarrhea. No rashes. He has not noticed any bleeding since the last hospitalizaton. He has had another flare of his gout. He reports no new complications from his Votrient.   Medications:  Current Outpatient Prescriptions  Medication Sig Dispense Refill  . pantoprazole (PROTONIX) 40 MG tablet Take 1 tablet (40 mg total) by mouth 2 (two) times daily before a meal.  60 tablet  2  . pazopanib (VOTRIENT) 200 MG tablet Take 2 tablets (400 mg total) by mouth daily. Take on an empty stomach.  60 tablet  1  . predniSONE (DELTASONE) 20 MG tablet Take 2 tablets (40 mg total) by mouth daily with breakfast.  12 tablet  0  . topiramate (TOPAMAX) 25 MG tablet Take 25 mg by mouth 2 (two) times daily.      Marland Kitchen  HYDROcodone-acetaminophen (NORCO/VICODIN) 5-325 MG per tablet Take 1 tablet by mouth every 6 (six) hours as needed.  30 tablet  1   No current facility-administered medications for this visit.     Allergies:  Allergies  Allergen Reactions  . Nsaids Other (See Comments)    Patient only has one kidney.   . Meperidine Hcl Hives and Rash    Past Medical History, Surgical history, Social history, and Family History were reviewed and updated.  Review of Systems: Constitutional:  Negative for fever, chills, night sweats, anorexia, weight loss, pain. Cardiovascular: no chest pain or dyspnea on exertion Respiratory: negative Neurological: negative Dermatological: negative ENT: negative Skin: Negative. Gastrointestinal: negative Genito-Urinary: negative Hematological and Lymphatic: negative Breast: negative Musculoskeletal: negative Remaining ROS negative.  Physical Exam: Blood pressure 143/92, pulse 64, temperature 97 F (36.1 C), temperature source Oral, resp. rate 18, height 5\' 11"  (1.803 m), weight 172 lb 14.4 oz (78.427 kg). ECOG: 1 General appearance: alert Head: Normocephalic, without obvious abnormality, atraumatic Neck: no adenopathy, no carotid bruit, no JVD, supple, symmetrical, trachea midline and thyroid not enlarged, symmetric, no tenderness/mass/nodules Lymph nodes: Cervical, supraclavicular, and axillary nodes normal. Heart:regular rate and rhythm, S1, S2 normal, no murmur, click, rub or gallop Lung:chest clear, no wheezing, rales, normal symmetric air entry Abdomen: soft, non-tender, without masses or organomegaly EXT:no erythema, induration, or nodules. No erythema. Small blister noted under the big toe.  Good pluses, no discoloration or rash  Noted.    CBC CBC    Component Value Date/Time   WBC 5.5 04/07/2013 1326   WBC 3.8* 03/12/2013 0604   RBC 3.26* 04/07/2013 1326   RBC 2.99* 03/12/2013 0604   HGB 11.8* 04/07/2013 1326   HGB 10.8* 03/12/2013 0604   HCT  33.9* 04/07/2013 1326   HCT 29.9* 03/12/2013 0604   PLT 81* 04/07/2013 1326   PLT 100* 03/12/2013 0604   MCV 104.0* 04/07/2013 1326   MCV 100.0 03/12/2013 0604   MCH 36.2* 04/07/2013 1326   MCH 36.1* 03/12/2013 0604   MCHC 34.8 04/07/2013 1326   MCHC 36.1* 03/12/2013 0604   RDW 20.0* 04/07/2013 1326   RDW 20.3* 03/12/2013 0604   LYMPHSABS 0.3* 04/07/2013 1326   LYMPHSABS 0.4* 03/10/2013 0732   MONOABS 0.1 04/07/2013 1326   MONOABS 0.4 03/10/2013 0732   EOSABS 0.0 04/07/2013 1326   EOSABS 0.0 03/10/2013 0732   BASOSABS 0.0 04/07/2013 1326   BASOSABS 0.0 03/10/2013 0732    CT CHEST, ABDOMEN AND PELVIS WITH CONTRAST  Technique: Multidetector CT imaging of the chest, abdomen and  pelvis was performed following the standard protocol during bolus  administration of intravenous contrast.  Contrast: OMNIPAQUE IOHEXOL 300 MG/ML SOLN  Comparison: 03/10/2013  CT CHEST  Findings: There is no pleural effusion identified. There is no  airspace consolidation or atelectasis identified. Pulmonary nodule  in the left lower lobe is stable measuring 4 mm, image number  43/series 5. This is unchanged from previous exam. No enlarged  mediastinal or hilar adenopathy.  The heart size appears normal. No pericardial effusion. No  enlarged mediastinal or hilar lymph nodes. There is no axillary or  supraclavicular adenopathy.  Review of the visualized bony structures shows no aggressive lytic  or sclerotic bone lesions.  IMPRESSION:  1. No acute cardiopulmonary abnormalities.  2. No specific features to suggest metastatic disease.  3. Left lower lobe pulmonary nodule is unchanged from previous  exam.  CT ABDOMEN AND PELVIS  Findings: There is a low attenuation structure within the dome of  liver measuring 7 mm, image number 54/series 2. The gallbladder  appears within normal limits. No biliary dilatation. Lesion  within the head of pancreas measures 1.7 cm, image 77/series 2.  This is compared with 2.7 cm  previously. The  The spleen appears normal.  Resolution of the previous enhancing left renal lesion. Previous  right nephrectomy. Cyst within the upper pole of the left kidney  measures 3.1 cm. The solid lesion within the upper pole of the  left kidney is again identified. This measures approximately this  is decreased from 3.9 x 3.7 cm previously.  The urinary bladder appears normal. The prostate gland and seminal  vesicles are unremarkable.  Normal caliber of the abdominal aorta. There is no upper abdominal  adenopathy. No pelvic or inguinal adenopathy identified.  The stomach appears normal. The small bowel loops are  unremarkable. The normal appearance of the proximal colon. There  are multiple distal colonic diverticula without acute inflammation.  There is no peritoneal nodule or mass identified.  Review of the visualized bony structures is significant for mild  multilevel spondylosis. No aggressive lytic or sclerotic bone  lesions identified.  IMPRESSION:  1. Findings compatible with interval response to therapy. There is  been decrease in size of the lesions within the head of pancreas  and upper pole of left kidney. The left adrenal lesion has  resolved in the  interval. No new or progressive disease identified  within the abdomen or pelvis.     Impression and Plan:   A 62 year old gentleman with the following issues:   1. A history of renal cell carcinoma. He had stage T1b diagnosed in October 2010 of the right kidney, status post right nephrectomy, now presents with a solid tumor of the left kidney. CT scan from 7/22/ 2014 showed regression of his disease in the kidney and the Pancrease . He is now back on Votrient at total of 400 mg (50% dose reduction) and doing well. I am not sure his toe pain is related to the medication and he is not willing to come off it at this time. We will continue the current schedule.   2. Pancreatic lesion. Biopsy proven to be malignant and  likely represent renal cell cancer. Regressed on Votrient.   3. Gout. Unclear that this current pain in his toes is related to gout vs medication effect. I gave Rx for Vicodin at this time.   4. Anemia. Hgb is stable. No active bleeding. No transfusion is indicated.  5. Increase LFTs: back to normal now. Will continue to follow this.   6. Follow-up. In about 1 month.   Thais Silberstein 3/11/201410:04 AM

## 2013-04-09 NOTE — Telephone Encounter (Signed)
gv and printed appt sched and avs for pt  °

## 2013-04-13 ENCOUNTER — Encounter: Payer: Self-pay | Admitting: Gastroenterology

## 2013-04-20 ENCOUNTER — Ambulatory Visit (INDEPENDENT_AMBULATORY_CARE_PROVIDER_SITE_OTHER): Payer: BC Managed Care – PPO | Admitting: Gastroenterology

## 2013-04-20 ENCOUNTER — Encounter: Payer: Self-pay | Admitting: Gastroenterology

## 2013-04-20 VITALS — BP 147/86 | HR 90 | Temp 96.8°F | Ht 71.0 in | Wt 170.0 lb

## 2013-04-20 DIAGNOSIS — IMO0002 Reserved for concepts with insufficient information to code with codable children: Secondary | ICD-10-CM

## 2013-04-20 DIAGNOSIS — K651 Peritoneal abscess: Secondary | ICD-10-CM

## 2013-04-20 DIAGNOSIS — K279 Peptic ulcer, site unspecified, unspecified as acute or chronic, without hemorrhage or perforation: Secondary | ICD-10-CM

## 2013-04-20 NOTE — Progress Notes (Signed)
Referring Provider: [name], MD Primary Care Physician:  [name], MD Primary GI: [name]   Chief Complaint  Patient presents with  . Follow-up    doing well    HPI:   Pleasant 62 year old male now returns today in follow-up after admission for GI bleed while on Votrient. Indomethacin, ETOH, and no PPI. Also notable, history of Barrett's. Found to have diverticulitis with small perforation during hospitalization. Colonoscopy up-to-date. EGD at time of admission June 2014 showed Barrett's esophagus, single ulcer in gastric antrum, chronic gastritis. Negative H.pylori. He will need repeat EGD in September to document healing of ulcer.  No black stool. No diarrhea. No abdominal pain. No N/V. Protonix BID. Back on Votrient. He has no complaints whatsoever today.   Past Medical History  Diagnosis Date  . Gout   . DJD (degenerative joint disease) of cervical spine   . Diverticulosis of colon     by colonoscopy 11/02/2009  . Barrett's esophagus     by EGD 11/02/2009  . Cancer     "was told has cancer on other kidney and spot on pancreas"  . 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 Anemia     Past Surgical History  Procedure Laterality Date  . Nephrectomy      right due to cancer  . Lung removal, partial      left lower lobe, benign  . Eus  08/07/2012    Procedure: UPPER ENDOSCOPIC ULTRASOUND (EUS) LINEAR;  Surgeon: Rachael Fee, MD;  Location: [facility];  Service: Endoscopy;  Laterality: N/A;  . Inguinal hernia repair    . Hernia repair    . Inguinal hernia repair      Right  . Eus  08/21/2012    Procedure: UPPER ENDOSCOPIC ULTRASOUND (EUS) LINEAR;  Surgeon: Rachael Fee, MD;  Location: [facility];  Service: Endoscopy;  Laterality: N/A;  . Colonoscopy  Feb 2011    [name]: single anal papilla, pan-colonic diverticula  . Esophagogastroduodenoscopy  Feb 2011    [name]: salmon-colored epithelium consistent with Barrett's, nodular antral  erosions, small hiatal hernia, negative H.pylori  . Colonoscopy  09/25/2012    RMR: Colonic diverticulosis. Suspect diverticular bleeding-hepatic flexure tic-sealed  . Esophagogastroduodenoscopy  09/25/2012    EAV:[id number] esophagus. Small hiatal hernia. Antral erosions of doubtful clinical significance, no biopsies  . Esophagogastroduodenoscopy N/A 03/11/2013    SLF: Barrett's esophagus/Single ulcer in the gastric antrum-PROBABLE SOURCE FOR BLACK STOOL/ Chronic gastritis in the gastric antrum    Current Outpatient Prescriptions  Medication Sig Dispense Refill  . HYDROcodone-acetaminophen (NORCO/VICODIN) 5-325 MG per tablet Take 1 tablet by mouth every 6 (six) hours as needed.  30 tablet  1  . pantoprazole (PROTONIX) 40 MG tablet Take 1 tablet (40 mg total) by mouth 2 (two) times daily before a meal.  60 tablet  2  . pazopanib (VOTRIENT) 200 MG tablet Take 2 tablets (400 mg total) by mouth daily. Take on an empty stomach.  60 tablet  1  . predniSONE (DELTASONE) 20 MG tablet Take 2 tablets (40 mg total) by mouth daily with breakfast.  12 tablet  0  . topiramate (TOPAMAX) 25 MG tablet Take 25 mg by mouth 2 (two) times daily.       No current facility-administered medications for this visit.    Allergies as of 04/20/2013 - Review Complete 04/09/2013  Allergen Reaction Noted  . Nsaids Other (See Comments) 06/05/2011  . Codeine Rash 04/20/2013  .  Meperidine hcl Hives and Rash     Family History  Problem Relation Age of Onset  . Lung cancer Brother   . Prostate cancer Brother   . Colon cancer Neg Hx   . Lumbar disc disease Brother   . Prostate cancer Brother     History   Social History  . Marital Status: Married    Spouse Name: N/A    Number of Children: N/A  . Years of Education: N/A   Occupational History  .      textiles in [place]    Social History Main Topics  . Smoking status: Former Smoker    Types: Cigars  . Smokeless tobacco: Never Used     Comment: occasional  cigar  . Alcohol Use: Yes     Comment: vodka and budweiser, sometimes daily, depends on notion. couple drinks  . Drug Use: No  . Sexually Active: Yes   Other Topics Concern  . None   Social History Narrative  . None    Review of Systems: Negative unless mentioned in HPI.   Physical Exam: BP 147/86  Pulse 90  Temp(Src) 96.8 F (36 C) (Oral)  Ht 5\' 11"  (1.803 m)  Wt 170 lb (77.111 kg)  BMI 23.72 kg/m2 General:   Alert and oriented. No distress noted. Pleasant and cooperative.  Head:  Normocephalic and atraumatic. Eyes:  Conjuctiva clear without scleral icterus. Mouth:  Oral mucosa pink and moist. Good dentition. No lesions. Heart:  S1, S2 present without murmurs, rubs, or gallops. Regular rate and rhythm. Abdomen:  +BS, soft, non-tender and non-distended. No rebound or guarding. No HSM or masses noted. Msk:  Symmetrical without gross deformities. Normal posture. Extremities:  Without edema. Neurologic:  Alert and  oriented x4;  grossly normal neurologically. Skin:  Intact without significant lesions or rashes. Psych:  Alert and cooperative. Normal mood and affect.

## 2013-04-20 NOTE — Patient Instructions (Addendum)
Continue to take Protonix twice a day.   We will see you back in September to set up another endoscopy to make sure the ulcer has healed.  Please call us with any problems whatsoever!

## 2013-04-21 DIAGNOSIS — K279 Peptic ulcer, site unspecified, unspecified as acute or chronic, without hemorrhage or perforation: Secondary | ICD-10-CM | POA: Insufficient documentation

## 2013-04-21 NOTE — Assessment & Plan Note (Signed)
62 year old male with gastric ulcer in the setting of Votrient, NSAIDs, ETOH and without a PPI. Now on Protonix BID without any further issues. Will need repeat EGD in September for documentation of healing.   Continue PPI BID for total of 3 months, then once daily forever Barrett's surveillance at time of EGD (biopsies needed) Return in September

## 2013-04-21 NOTE — Assessment & Plan Note (Signed)
Diverticulitis with small perforation June 2014. Clinically improved, abx completed. Last colonoscopy Jan 2014.

## 2013-04-23 NOTE — Progress Notes (Signed)
Cc PCP 

## 2013-04-27 ENCOUNTER — Other Ambulatory Visit: Payer: Self-pay | Admitting: *Deleted

## 2013-04-27 NOTE — Telephone Encounter (Signed)
THIS REFILL REQUEST FOR VOTRIENT WAS PLACED IN DR.SHADAD'S ACTIVE WORK FOLDER. 

## 2013-04-28 ENCOUNTER — Other Ambulatory Visit: Payer: Self-pay | Admitting: *Deleted

## 2013-04-28 DIAGNOSIS — C649 Malignant neoplasm of unspecified kidney, except renal pelvis: Secondary | ICD-10-CM

## 2013-04-28 MED ORDER — PAZOPANIB HCL 200 MG PO TABS: 400.0000 mg | ORAL_TABLET | Freq: Every day | ORAL | Status: DC

## 2013-05-07 ENCOUNTER — Other Ambulatory Visit: Payer: BC Managed Care – PPO | Admitting: Lab

## 2013-05-07 ENCOUNTER — Ambulatory Visit: Payer: BC Managed Care – PPO | Admitting: Oncology

## 2013-05-13 ENCOUNTER — Ambulatory Visit: Payer: BC Managed Care – PPO | Admitting: Nurse Practitioner

## 2013-05-13 ENCOUNTER — Other Ambulatory Visit (HOSPITAL_BASED_OUTPATIENT_CLINIC_OR_DEPARTMENT_OTHER): Payer: BC Managed Care – PPO | Admitting: Lab

## 2013-05-13 ENCOUNTER — Ambulatory Visit (HOSPITAL_BASED_OUTPATIENT_CLINIC_OR_DEPARTMENT_OTHER): Payer: BC Managed Care – PPO | Admitting: Oncology

## 2013-05-13 ENCOUNTER — Encounter: Payer: Self-pay | Admitting: Oncology

## 2013-05-13 ENCOUNTER — Other Ambulatory Visit: Payer: BC Managed Care – PPO | Admitting: Lab

## 2013-05-13 ENCOUNTER — Telehealth: Payer: Self-pay | Admitting: Oncology

## 2013-05-13 VITALS — BP 122/79 | HR 77 | Temp 97.2°F | Resp 18 | Ht 71.0 in | Wt 171.6 lb

## 2013-05-13 DIAGNOSIS — C649 Malignant neoplasm of unspecified kidney, except renal pelvis: Secondary | ICD-10-CM

## 2013-05-13 DIAGNOSIS — M109 Gout, unspecified: Secondary | ICD-10-CM

## 2013-05-13 DIAGNOSIS — C7889 Secondary malignant neoplasm of other digestive organs: Secondary | ICD-10-CM

## 2013-05-13 DIAGNOSIS — D630 Anemia in neoplastic disease: Secondary | ICD-10-CM

## 2013-05-13 LAB — CBC WITH DIFFERENTIAL/PLATELET
BASO%: 0.5 % (ref 0.0–2.0)
Eosinophils Absolute: 0.1 10*3/uL (ref 0.0–0.5)
LYMPH%: 17.5 % (ref 14.0–49.0)
MCHC: 34.8 g/dL (ref 32.0–36.0)
MONO#: 0.4 10*3/uL (ref 0.1–0.9)
NEUT#: 1.8 10*3/uL (ref 1.5–6.5)
Platelets: 65 10*3/uL — ABNORMAL LOW (ref 140–400)
RBC: 2.79 10*6/uL — ABNORMAL LOW (ref 4.20–5.82)
RDW: 21.6 % — ABNORMAL HIGH (ref 11.0–14.6)
WBC: 2.8 10*3/uL — ABNORMAL LOW (ref 4.0–10.3)
lymph#: 0.5 10*3/uL — ABNORMAL LOW (ref 0.9–3.3)

## 2013-05-13 LAB — COMPREHENSIVE METABOLIC PANEL (CC13)
ALT: 14 U/L (ref 0–55)
Albumin: 3.4 g/dL — ABNORMAL LOW (ref 3.5–5.0)
CO2: 24 mEq/L (ref 22–29)
Calcium: 8.7 mg/dL (ref 8.4–10.4)
Chloride: 105 mEq/L (ref 98–109)
Glucose: 112 mg/dl (ref 70–140)
Potassium: 4 mEq/L (ref 3.5–5.1)
Sodium: 139 mEq/L (ref 136–145)
Total Protein: 6.9 g/dL (ref 6.4–8.3)

## 2013-05-13 MED ORDER — HYDROCODONE-ACETAMINOPHEN 5-325 MG PO TABS: 1.0000 | ORAL_TABLET | Freq: Four times a day (QID) | ORAL | Status: DC | PRN

## 2013-05-13 NOTE — Progress Notes (Signed)
Hematology and Oncology Follow Up Visit  Jeffrey Trujillo [id number] 1950-11-04 62 y.o.    Principle Diagnosis: 62 year old with the following issues:  1. T1b right kidney cancer diagnosed in 2010. He is rdical nephrectomy on June 27, 2009. The pathological staging was T1b, was clear cell histology with Fuhrman grade 3/4.  2. Left Kidney mass  Measuring 4.5 x 2 7 x 3.6 irregular enhancing appears to be a new kidney cancer noted in 07/2012. 3. Pancreatic lesion. 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  Current therapy: Votrient  800 mg daily started in December 2013. Treatment held in 11/2012 due to increase LFTs. Votrient was resumed in April 2014 at 400 mg daily.  Interim History: Jeffrey Trujillo presents for a follow up visit. He is a nice man with the above issues. He has reported intermittent pain in his big toe on the right; better than last visit. Has been using Norco as needed. He is doing well otherwise with the Votrient. Denies chest pain, shortness of breath, abdominal pain, nausea, vomiting. No diarrhea. No rashes. He has not noticed any bleeding. He reports no new complications from his Votrient.   Medications:  Current Outpatient Prescriptions  Medication Sig Dispense Refill  . HYDROcodone-acetaminophen (NORCO/VICODIN) 5-325 MG per tablet Take 1 tablet by mouth every 6 (six) hours as needed.  60 tablet  0  . pantoprazole (PROTONIX) 40 MG tablet Take 1 tablet (40 mg total) by mouth 2 (two) times daily before a meal.  60 tablet  2  . pazopanib (VOTRIENT) 200 MG tablet Take 2 tablets (400 mg total) by mouth daily. Take on an empty stomach. Script faxed to [facility] 04/28/13.  Adaline Sill rn  60 tablet  0  . predniSONE (DELTASONE) 20 MG tablet Take 2 tablets (40 mg total) by mouth daily with breakfast.  12 tablet  0  . topiramate (TOPAMAX) 25 MG tablet Take 25 mg by mouth 2 (two) times daily.       No current facility-administered medications for this visit.      Allergies:  Allergies  Allergen Reactions  . Nsaids Other (See Comments)    Patient only has one kidney.   . Meperidine Hcl Hives and Rash    Past Medical History, Surgical history, Social history, and Family History were reviewed and updated.  Review of Systems: Constitutional:  Negative for fever, chills, night sweats, anorexia, weight loss, pain. Cardiovascular: no chest pain or dyspnea on exertion Respiratory: negative Neurological: negative Dermatological: negative ENT: negative Skin: Negative. Gastrointestinal: negative Genito-Urinary: negative Hematological and Lymphatic: negative Breast: negative Musculoskeletal: negative Remaining ROS negative.  Physical Exam: Blood pressure 143/92, pulse 64, temperature 97 F (36.1 C), temperature source Oral, resp. rate 18, height 5\' 11"  (1.803 m), weight 172 lb 14.4 oz (78.427 kg). ECOG: 1 General appearance: alert Head: Normocephalic, without obvious abnormality, atraumatic Neck: no adenopathy, no carotid bruit, no JVD, supple, symmetrical, trachea midline and thyroid not enlarged, symmetric, no tenderness/mass/nodules Lymph nodes: Cervical, supraclavicular, and axillary nodes normal. Heart:regular rate and rhythm, S1, S2 normal, no murmur, click, rub or gallop Lung:chest clear, no wheezing, rales, normal symmetric air entry Abdomen: soft, non-tender, without masses or organomegaly EXT:no erythema, induration, or nodules. No erythema. Small blister noted under the big toe. Good pluses, no discoloration or rash  Noted.    CBC CBC    Component Value Date/Time   WBC 2.8* 05/13/2013 1438   WBC 3.8* 03/12/2013 0604   RBC 2.79* 05/13/2013 1438  RBC 2.99* 03/12/2013 0604   HGB 10.5* 05/13/2013 1438   HGB 10.8* 03/12/2013 0604   HCT 30.3* 05/13/2013 1438   HCT 29.9* 03/12/2013 0604   PLT 65* 05/13/2013 1438   PLT 100* 03/12/2013 0604   MCV 108.4* 05/13/2013 1438   MCV 100.0 03/12/2013 0604   MCH 37.8* 05/13/2013 1438   MCH  36.1* 03/12/2013 0604   MCHC 34.8 05/13/2013 1438   MCHC 36.1* 03/12/2013 0604   RDW 21.6* 05/13/2013 1438   RDW 20.3* 03/12/2013 0604   LYMPHSABS 0.5* 05/13/2013 1438   LYMPHSABS 0.4* 03/10/2013 0732   MONOABS 0.4 05/13/2013 1438   MONOABS 0.4 03/10/2013 0732   EOSABS 0.1 05/13/2013 1438   EOSABS 0.0 03/10/2013 0732   BASOSABS 0.0 05/13/2013 1438   BASOSABS 0.0 03/10/2013 0732    Impression and Plan:   A 62 year old gentleman with the following issues:   1. A history of renal cell carcinoma. He had stage T1b diagnosed in October 2010 of the right kidney, status post right nephrectomy, now presents with a solid tumor of the left kidney. CT scan from 7/22/ 2014 showed regression of his disease in the kidney and the pancreas . He is now on Votrient at total of 400 mg (50% dose reduction) and doing well. I am not sure his toe pain is related to the medication and he is not willing to come off it at this time. He has thrombocytopenia likely due to the Votrient. Will discuss with Dr Clelia Croft, but I have told him to continue the current dose for now as he is already dose reduced. Will call him once I have discussed with MD.  2. Pancreatic lesion. Biopsy proven to be malignant and likely represent renal cell cancer. Regressed on Votrient.   3. Gout. Unclear that this current pain in his toes is related to gout vs medication effect. I refilled his Vicodin today.   4. Anemia. Hgb is stable. No active bleeding. No transfusion is indicated.  5. Increase LFTs: back to normal now. Will continue to follow this.   6. Follow-up. In about 1 month.   [name] NP 3/11/201410:04 AM

## 2013-05-13 NOTE — Telephone Encounter (Signed)
gv and printed appt sched and avs for pt  °

## 2013-05-14 ENCOUNTER — Encounter: Payer: Self-pay | Admitting: *Deleted

## 2013-05-14 NOTE — Progress Notes (Signed)
RECEIVED A FAX FROM [facility] CONCERNING A CONFIRMATION OF PRESCRIPTION SHIPMENT FOR VOTRIENT ON 05/13/13.

## 2013-06-05 ENCOUNTER — Other Ambulatory Visit: Payer: Self-pay | Admitting: *Deleted

## 2013-06-05 DIAGNOSIS — C649 Malignant neoplasm of unspecified kidney, except renal pelvis: Secondary | ICD-10-CM

## 2013-06-05 MED ORDER — PAZOPANIB HCL 200 MG PO TABS: 400.0000 mg | ORAL_TABLET | Freq: Every day | ORAL | Status: DC

## 2013-06-05 NOTE — Telephone Encounter (Signed)
THIS REFILL REQUEST FOR VOTRIENT WAS GIVEN TO DR.SHADAD'S NURSE, DIXIE SMITH,RN.

## 2013-06-09 ENCOUNTER — Emergency Department (HOSPITAL_COMMUNITY)
Admission: EM | Admit: 2013-06-09 | Discharge: 2013-06-09 | Disposition: A | Discharge status: Home / Self Care | Payer: BC Managed Care – PPO | Attending: Emergency Medicine | Admitting: Emergency Medicine

## 2013-06-09 ENCOUNTER — Emergency Department (HOSPITAL_COMMUNITY): Payer: BC Managed Care – PPO

## 2013-06-09 ENCOUNTER — Encounter (HOSPITAL_COMMUNITY): Payer: Self-pay | Admitting: *Deleted

## 2013-06-09 DIAGNOSIS — M1990 Unspecified osteoarthritis, unspecified site: Secondary | ICD-10-CM

## 2013-06-09 DIAGNOSIS — Z87891 Personal history of nicotine dependence: Secondary | ICD-10-CM | POA: Insufficient documentation

## 2013-06-09 DIAGNOSIS — M545 Low back pain, unspecified: Secondary | ICD-10-CM | POA: Insufficient documentation

## 2013-06-09 DIAGNOSIS — C7889 Secondary malignant neoplasm of other digestive organs: Secondary | ICD-10-CM | POA: Insufficient documentation

## 2013-06-09 DIAGNOSIS — C799 Secondary malignant neoplasm of unspecified site: Secondary | ICD-10-CM

## 2013-06-09 DIAGNOSIS — Z8719 Personal history of other diseases of the digestive system: Secondary | ICD-10-CM | POA: Insufficient documentation

## 2013-06-09 DIAGNOSIS — Z862 Personal history of diseases of the blood and blood-forming organs and certain disorders involving the immune mechanism: Secondary | ICD-10-CM | POA: Insufficient documentation

## 2013-06-09 DIAGNOSIS — Z8639 Personal history of other endocrine, nutritional and metabolic disease: Secondary | ICD-10-CM | POA: Insufficient documentation

## 2013-06-09 DIAGNOSIS — Z79899 Other long term (current) drug therapy: Secondary | ICD-10-CM | POA: Insufficient documentation

## 2013-06-09 DIAGNOSIS — M549 Dorsalgia, unspecified: Secondary | ICD-10-CM

## 2013-06-09 DIAGNOSIS — Z85528 Personal history of other malignant neoplasm of kidney: Secondary | ICD-10-CM | POA: Insufficient documentation

## 2013-06-09 DIAGNOSIS — R112 Nausea with vomiting, unspecified: Secondary | ICD-10-CM

## 2013-06-09 LAB — CBC WITH DIFFERENTIAL/PLATELET
Basophils Absolute: 0 10*3/uL (ref 0.0–0.1)
Eosinophils Relative: 1 % (ref 0–5)
Lymphs Abs: 0.3 10*3/uL — ABNORMAL LOW (ref 0.7–4.0)
MCV: 110.8 fL — ABNORMAL HIGH (ref 78.0–100.0)
Monocytes Absolute: 0.2 10*3/uL (ref 0.1–1.0)
Monocytes Relative: 10 % (ref 3–12)
Neutrophils Relative %: 68 % (ref 43–77)
Platelets: DECREASED 10*3/uL (ref 150–400)
RBC: 2.96 MIL/uL — ABNORMAL LOW (ref 4.22–5.81)
RDW: 18 % — ABNORMAL HIGH (ref 11.5–15.5)
WBC: 1.5 10*3/uL — ABNORMAL LOW (ref 4.0–10.5)

## 2013-06-09 LAB — COMPREHENSIVE METABOLIC PANEL
Albumin: 3.6 g/dL (ref 3.5–5.2)
BUN: 14 mg/dL (ref 6–23)
Calcium: 9.5 mg/dL (ref 8.4–10.5)
GFR calc Af Amer: 74 mL/min — ABNORMAL LOW (ref >90)
Glucose, Bld: 95 mg/dL (ref 70–99)
Potassium: 3.9 mEq/L (ref 3.5–5.1)
Sodium: 141 mEq/L (ref 135–145)
Total Protein: 7.1 g/dL (ref 6.0–8.3)

## 2013-06-09 LAB — TROPONIN I: Troponin I: <0.3 ng/mL (ref <0.30)

## 2013-06-09 LAB — URINALYSIS, ROUTINE W REFLEX MICROSCOPIC
Glucose, UA: NEGATIVE mg/dL
Leukocytes, UA: NEGATIVE
Nitrite: NEGATIVE
Specific Gravity, Urine: 1.02 (ref 1.005–1.030)
pH: 7 (ref 5.0–8.0)

## 2013-06-09 MED ORDER — SODIUM CHLORIDE 0.9 % IV SOLN
INTRAVENOUS | Status: DC
  Administered 2013-06-09: 1000 mL via INTRAVENOUS

## 2013-06-09 MED ORDER — ONDANSETRON HCL 8 MG PO TABS: 8.0000 mg | ORAL_TABLET | Freq: Three times a day (TID) | ORAL | Status: DC | PRN

## 2013-06-09 MED ORDER — ONDANSETRON HCL 4 MG/2ML IJ SOLN
4.0000 mg | Freq: Once | INTRAMUSCULAR | Status: AC
  Administered 2013-06-09: 4 mg via INTRAVENOUS
  Filled 2013-06-09: qty 2

## 2013-06-09 MED ORDER — SODIUM CHLORIDE 0.9 % IV BOLUS (SEPSIS)
500.0000 mL | Freq: Once | INTRAVENOUS | Status: AC
  Administered 2013-06-09: 500 mL via INTRAVENOUS

## 2013-06-09 MED ORDER — MORPHINE SULFATE 4 MG/ML IJ SOLN
4.0000 mg | Freq: Once | INTRAMUSCULAR | Status: AC
  Administered 2013-06-09: 4 mg via INTRAVENOUS
  Filled 2013-06-09: qty 1

## 2013-06-09 NOTE — ED Notes (Signed)
Low back pain began yesterday, got worse today around 1600 and vomited.  Started having chest pressure and palpitations.

## 2013-06-09 NOTE — ED Provider Notes (Signed)
CSN: [id number]     Arrival date & time 06/09/13  1837 History   First MD Initiated Contact with Patient 06/09/13 1855     Chief Complaint  Patient presents with  . Palpitations   (Consider location/radiation/quality/duration/timing/severity/associated sxs/prior Treatment) HPI Comments: Jeffrey Trujillo is a 62 y.o. male who states that he began vomiting today, and has been unable to tolerate medications liquids or solid foods. He denies diarrhea. The emesis is patellar of ingested food. There's been no blood in the emesis. He has also had palpitations today, but no chest pain. He has low back pain since yesterday; that is recurrent, and similar to prior pain that comes and goes, and is worse with activity. He is being treated for renal cancer with oral agents. He has metastatic disease. There no sick contacts, and no known suspect food ingestion. There are no other known modifying factors.   Patient is a 62 y.o. male presenting with palpitations. The history is provided by the patient.  Palpitations   Past Medical History  Diagnosis Date  . Gout   . DJD (degenerative joint disease) of cervical spine   . Diverticulosis of colon     by colonoscopy 11/02/2009  . Barrett's esophagus     by EGD 11/02/2009  . Cancer     "was told has cancer on other kidney and spot on pancreas"  . Anemia   . Renal cell cancer     with mets to pancreas   Past Surgical History  Procedure Laterality Date  . Nephrectomy      right due to cancer  . Lung removal, partial      left lower lobe, benign  . Eus  08/07/2012    Procedure: UPPER ENDOSCOPIC ULTRASOUND (EUS) LINEAR;  Surgeon: Rachael Fee, MD;  Location: [facility];  Service: Endoscopy;  Laterality: N/A;  . Inguinal hernia repair    . Hernia repair    . Inguinal hernia repair      Right  . Eus  08/21/2012    Procedure: UPPER ENDOSCOPIC ULTRASOUND (EUS) LINEAR;  Surgeon: Rachael Fee, MD;  Location: [facility];  Service: Endoscopy;   Laterality: N/A;  . Colonoscopy  Feb 2011    [name]: single anal papilla, pan-colonic diverticula  . Esophagogastroduodenoscopy  Feb 2011    [name]: salmon-colored epithelium consistent with Barrett's, nodular antral erosions, small hiatal hernia, negative H.pylori  . Colonoscopy  09/25/2012    RMR: Colonic diverticulosis. Suspect diverticular bleeding-hepatic flexure tic-sealed  . Esophagogastroduodenoscopy  09/25/2012    [name]:WJXBJYN'W esophagus. Small hiatal hernia. Antral erosions of doubtful clinical significance, no biopsies  . Esophagogastroduodenoscopy N/A 03/11/2013    SLF: Barrett's esophagus/Single ulcer in the gastric antrum-PROBABLE SOURCE FOR BLACK STOOL/ Chronic gastritis in the gastric antrum   Family History  Problem Relation Age of Onset  . Lung cancer Brother   . Prostate cancer Brother   . Colon cancer Neg Hx   . Lumbar disc disease Brother   . Prostate cancer Brother    History  Substance Use Topics  . Smoking status: Former Smoker    Types: Cigars  . Smokeless tobacco: Never Used     Comment: occasional cigar  . Alcohol Use: Yes     Comment: vodka and budweiser, sometimes daily, depends on notion. couple drinks    Review of Systems  Cardiovascular: Positive for palpitations.  All other systems reviewed and are negative.    Allergies  Nsaids; Codeine; and Meperidine hcl  Home  Medications   Current Outpatient Rx  Name  Route  Sig  Dispense  Refill  . HYDROcodone-acetaminophen (NORCO/VICODIN) 5-325 MG per tablet   Oral   Take 1 tablet by mouth every 6 (six) hours as needed.   60 tablet   0   . pantoprazole (PROTONIX) 40 MG tablet   Oral   Take 1 tablet (40 mg total) by mouth 2 (two) times daily before a meal.   60 tablet   2   . pazopanib (VOTRIENT) 200 MG tablet   Oral   Take 2 tablets (400 mg total) by mouth daily. Take on an empty stomach. Script faxed to [facility] 06/05/13.  Adaline Sill rn   60 tablet   0     [name] has ordered for  patient to resume votri ...   . topiramate (TOPAMAX) 25 MG tablet   Oral   Take 25 mg by mouth 2 (two) times daily.         . ondansetron (ZOFRAN) 8 MG tablet   Oral   Take 1 tablet (8 mg total) by mouth every 8 (eight) hours as needed for nausea.   20 tablet   0    BP 145/86  Pulse 61  Temp(Src) 98.3 F (36.8 C) (Oral)  Resp 11  Ht 5\' 11"  (1.803 m)  Wt 172 lb (78.019 kg)  BMI 24 kg/m2  SpO2 100% Physical Exam  Nursing note and vitals reviewed. Constitutional: He is oriented to person, place, and time. He appears well-developed and well-nourished.  HENT:  Head: Normocephalic and atraumatic.  Right Ear: External ear normal.  Left Ear: External ear normal.  Eyes: Conjunctivae and EOM are normal. Pupils are equal, round, and reactive to light.  Neck: Normal range of motion and phonation normal. Neck supple.  Cardiovascular: Normal rate, regular rhythm, normal heart sounds and intact distal pulses.   Pulmonary/Chest: Effort normal and breath sounds normal. No respiratory distress. He has no wheezes. He exhibits no tenderness and no bony tenderness.  Abdominal: Soft. Normal appearance. He exhibits no distension. There is no tenderness. There is no guarding.  Musculoskeletal: Normal range of motion.  Nontender spine and back, and pelvis. Normal range of motion. Spine.  Neurological: He is alert and oriented to person, place, and time. He has normal strength. No cranial nerve deficit or sensory deficit. He exhibits normal muscle tone. Coordination normal.  Skin: Skin is warm, dry and intact.  Psychiatric: He has a normal mood and affect. His behavior is normal. Judgment and thought content normal.    ED Course  Procedures (including critical care time)  Medications  0.9 %  sodium chloride infusion (1,000 mLs Intravenous New Bag/Given 06/09/13 1924)  sodium chloride 0.9 % bolus 500 mL (0 mLs Intravenous Stopped 06/09/13 1945)  morphine 4 MG/ML injection 4 mg (4 mg Intravenous  Given 06/09/13 1927)  ondansetron (ZOFRAN) injection 4 mg (4 mg Intravenous Given 06/09/13 1924)    Patient Vitals for the past 24 hrs:  BP Temp Temp src Pulse Resp SpO2 Height Weight  06/09/13 2100 145/86 mmHg - - 61 - 100 % - -  06/09/13 2000 144/51 mmHg - - 58 - 98 % - -  06/09/13 1907 150/79 mmHg - - 63 - 99 % - -  06/09/13 1839 150/87 mmHg 98.3 F (36.8 C) Oral 58 11 97 % 5\' 11"  (1.803 m) 172 lb (78.019 kg)   9:12 PM Reevaluation with update and discussion. After initial assessment and treatment, an updated  evaluation reveals he feels better, at this time. He is able to tolerate oral fluids.Mancel Bale L     Date: 06/09/13  Rate: 74  Rhythm: indeterminate  QRS Axis: normal  PR and QT Intervals: QT normal  ST/T Wave abnormalities: normal  PR and QRS Conduction Disutrbances:QT normal  Narrative Interpretation:   Old EKG Reviewed: changes noted since 10/10/12, rate faster and SR not now clearly present      Labs Review Labs Reviewed  CBC WITH DIFFERENTIAL - Abnormal; Notable for the following:    WBC 1.5 (*)    RBC 2.96 (*)    Hemoglobin 11.5 (*)    HCT 32.8 (*)    MCV 110.8 (*)    MCH 38.9 (*)    RDW 18.0 (*)    Neutro Abs 1.0 (*)    Lymphs Abs 0.3 (*)    All other components within normal limits  COMPREHENSIVE METABOLIC PANEL - Abnormal; Notable for the following:    AST 43 (*)    Total Bilirubin 2.5 (*)    GFR calc non Af Amer 64 (*)    GFR calc Af Amer 74 (*)    All other components within normal limits  URINALYSIS, ROUTINE W REFLEX MICROSCOPIC - Abnormal; Notable for the following:    Bilirubin Urine SMALL (*)    Ketones, ur >80 (*)    Urobilinogen, UA 2.0 (*)    All other components within normal limits  TROPONIN I   Imaging Review Dg Chest 2 View  06/09/2013   CLINICAL DATA:  Chest pain. Palpitations. Back pain. Left lower lobectomy.  EXAM: CHEST  2 VIEW  COMPARISON:  04/07/2013 CT scan  FINDINGS: Postoperative findings from lobectomy noted with left  6th rib deformity in blunted left lateral costophrenic angle.  Cardiac and mediastinal margins appear normal. The lungs appear otherwise clear. No acute findings.  IMPRESSION: 1. Chronic postoperative findings in the left chest. No acute findings.   Electronically Signed   By: Herbie Baltimore   On: 06/09/2013 20:14   Dg Lumbar Spine Complete  06/09/2013   CLINICAL DATA:  Low back pain. Prior left nephrectomy.  EXAM: LUMBAR SPINE - COMPLETE 4+ VIEW  COMPARISON:  CT scan from 04/07/2013  FINDINGS: Facet arthropathy observed at L4-5 and L5-S1 with loss of disc height at L5-S1 similar to prior CT scan. No fracture or subluxation is observed.  IMPRESSION: 1. Lower lumbar spondylosis with degenerative loss of disc space at L5-S1. No acute findings.   Electronically Signed   By: Herbie Baltimore   On: 06/09/2013 20:16   Dg Pelvis 1-2 Views  06/09/2013   CLINICAL DATA:  Low back pain.  Pelvic pain.  EXAM: PELVIS - 1-2 VIEW  COMPARISON:  CT scan from 04/07/2013  FINDINGS: Degenerative spurring with subcortical cyst formation noted in the acetabular roof and femoral heads bilaterally. There is mild degenerative loss of articular space in the hips.  Sacroiliac joints unremarkable.  No acute bony findings.  IMPRESSION: 1. Degenerative arthropathy of the hips, similar to recent CT scan. 2. No acute findings.   Electronically Signed   By: Herbie Baltimore   On: 06/09/2013 20:18    MDM   1. Back pain   2. Degenerative joint disease   3. Nausea and vomiting   4. Metastatic cancer      Nonspecific vomiting; improved after treatment in the emergency department. He has palpitations, without chest pain. Palpitations are likely associated with the vomiting, and not indicative of  an acute cardiac process. He's improved with treatment in stable for discharge. There is no evidence for metastatic cancer causing his illness. His mildly depressed. Blood cell counts are likely related to the chemotherapy  treatments.  Nursing Notes Reviewed/ Care Coordinated, and agree without changes. Applicable Imaging Reviewed.  Interpretation of Laboratory Data incorporated into ED treatment   Plan: Home Medications- Zofran and the usual; Home Treatments and Observation- gradually advance diet; return here if the recommended treatment, does not improve the symptoms; Recommended follow up- PCP, for check up in 3 or 4 days.      Flint Melter, MD 06/09/13 2118

## 2013-06-09 NOTE — ED Notes (Signed)
Pt alert & oriented x4, stable gait. Patient  given discharge instructions, paperwork & prescription(s). Patient verbalized understanding. Pt left department w/ no further questions. 

## 2013-06-16 ENCOUNTER — Telehealth: Payer: Self-pay | Admitting: *Deleted

## 2013-06-16 NOTE — Telephone Encounter (Signed)
Stephanie from [facility] called to advise they are unable to reach pt to confirm shipment and delivery of votrient. Pt has appt on Friday with MD, called pt to advise with [facility] #

## 2013-06-19 ENCOUNTER — Other Ambulatory Visit (HOSPITAL_BASED_OUTPATIENT_CLINIC_OR_DEPARTMENT_OTHER): Payer: BC Managed Care – PPO | Admitting: Lab

## 2013-06-19 ENCOUNTER — Ambulatory Visit (HOSPITAL_BASED_OUTPATIENT_CLINIC_OR_DEPARTMENT_OTHER): Payer: BC Managed Care – PPO | Admitting: Oncology

## 2013-06-19 ENCOUNTER — Telehealth: Payer: Self-pay | Admitting: Oncology

## 2013-06-19 VITALS — BP 134/68 | HR 56 | Temp 97.8°F | Resp 18 | Ht 71.0 in | Wt 172.5 lb

## 2013-06-19 DIAGNOSIS — M109 Gout, unspecified: Secondary | ICD-10-CM

## 2013-06-19 DIAGNOSIS — C649 Malignant neoplasm of unspecified kidney, except renal pelvis: Secondary | ICD-10-CM

## 2013-06-19 DIAGNOSIS — D649 Anemia, unspecified: Secondary | ICD-10-CM

## 2013-06-19 LAB — CBC WITH DIFFERENTIAL/PLATELET
Basophils Absolute: 0 10*3/uL (ref 0.0–0.1)
Eosinophils Absolute: 0 10*3/uL (ref 0.0–0.5)
HGB: 8.9 g/dL — ABNORMAL LOW (ref 13.0–17.1)
MONO#: 0.3 10*3/uL (ref 0.1–0.9)
MONO%: 15.8 % — ABNORMAL HIGH (ref 0.0–14.0)
NEUT#: 1.1 10*3/uL — ABNORMAL LOW (ref 1.5–6.5)
RBC: 2.45 10*6/uL — ABNORMAL LOW (ref 4.20–5.82)
RDW: 17.3 % — ABNORMAL HIGH (ref 11.0–14.6)
WBC: 1.9 10*3/uL — ABNORMAL LOW (ref 4.0–10.3)
lymph#: 0.4 10*3/uL — ABNORMAL LOW (ref 0.9–3.3)

## 2013-06-19 LAB — COMPREHENSIVE METABOLIC PANEL (CC13)
Albumin: 3.2 g/dL — ABNORMAL LOW (ref 3.5–5.0)
Alkaline Phosphatase: 71 U/L (ref 40–150)
CO2: 26 mEq/L (ref 22–29)
Calcium: 8.6 mg/dL (ref 8.4–10.4)
Chloride: 111 mEq/L — ABNORMAL HIGH (ref 98–109)
Glucose: 79 mg/dl (ref 70–140)
Potassium: 4.2 mEq/L (ref 3.5–5.1)
Sodium: 143 mEq/L (ref 136–145)
Total Protein: 6.6 g/dL (ref 6.4–8.3)

## 2013-06-19 NOTE — Telephone Encounter (Signed)
gave pt oral contrast,lab and MD for November 2014

## 2013-06-19 NOTE — Progress Notes (Signed)
Hematology and Oncology Follow Up Visit  MARQUEZE RAMCHARAN [id number] April 02, 1951 62 y.o.    Principle Diagnosis: 62 year old with the following issues:  1. T1b right kidney cancer diagnosed in 2010. He is rdical nephrectomy on June 27, 2009. The pathological staging was T1b, was clear cell histology with Fuhrman grade 3/4.  2. Left Kidney mass  Measuring 4.5 x 2 7 x 3.6 irregular enhancing appears to be a new kidney cancer noted in 07/2012. 3. Pancreatic lesion. 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  Current therapy: Votrient  800 mg daily started in December 2013. Treatment held in 11/2012 due to increase LFTs. Votrient was resumed in April 2014 at 400 mg daily.  Interim History: Mr. Lewelling presents for a follow up visit. He is a nice man with the above issues. Since his last visit, he have been doing relatively well. He did have a visit to the emergency department on September 23 for symptoms of back pain and dehydration. That evaluation included a laboratory data as well as imaging studies which did not show any clear-cut new pathology. He has reported intermittent pain in his big toe on the right; better than last visit. Has been using Norco as needed. He is doing well otherwise with the Votrient. Denies chest pain, shortness of breath, abdominal pain, nausea, vomiting. No diarrhea. No rashes. He has not noticed any bleeding. He reports no new complications from his Votrient.   Medications:  Current Outpatient Prescriptions  Medication Sig Dispense Refill  . HYDROcodone-acetaminophen (NORCO/VICODIN) 5-325 MG per tablet Take 1 tablet by mouth every 6 (six) hours as needed.  60 tablet  0  . indomethacin (INDOCIN) 50 MG capsule Take 50 mg by mouth daily.      . ondansetron (ZOFRAN) 8 MG tablet Take 1 tablet (8 mg total) by mouth every 8 (eight) hours as needed for nausea.  20 tablet  0  . pantoprazole (PROTONIX) 40 MG tablet Take 1 tablet (40 mg total) by mouth 2 (two)  times daily before a meal.  60 tablet  2  . pazopanib (VOTRIENT) 200 MG tablet Take 2 tablets (400 mg total) by mouth daily. Take on an empty stomach. Script faxed to [facility] 06/05/13.  DKatrinka Blazing rn  60 tablet  0  . topiramate (TOPAMAX) 25 MG tablet Take 25 mg by mouth 2 (two) times daily.       No current facility-administered medications for this visit.     Allergies:  Allergies  Allergen Reactions  . Nsaids Other (See Comments)    Patient only has one kidney.   . Meperidine Hcl Hives and Rash    Past Medical History, Surgical history, Social history, and Family History were reviewed and updated.  Review of Systems:  Remaining ROS negative.  Physical Exam: Blood pressure 143/92, pulse 64, temperature 97 F (36.1 C), temperature source Oral, resp. rate 18, height 5\' 11"  (1.803 m), weight 172 lb 14.4 oz (78.427 kg). ECOG: 1 General appearance: alert Head: Normocephalic, without obvious abnormality, atraumatic Neck: no adenopathy, no carotid bruit, no JVD, supple, symmetrical, trachea midline and thyroid not enlarged, symmetric, no tenderness/mass/nodules Lymph nodes: Cervical, supraclavicular, and axillary nodes normal. Heart:regular rate and rhythm, S1, S2 normal, no murmur, click, rub or gallop Lung:chest clear, no wheezing, rales, normal symmetric air entry Abdomen: soft, non-tender, without masses or organomegaly EXT:no erythema, induration, or nodules. No erythema. Small blister noted under the big toe. Good pluses, no discoloration or rash  noted.  CBC    Component Value Date/Time   WBC 1.9* 06/19/2013 1412   WBC 1.5* 06/09/2013 1935   RBC 2.45* 06/19/2013 1412   RBC 2.96* 06/09/2013 1935   HGB 8.9* 06/19/2013 1412   HGB 11.5* 06/09/2013 1935   HCT 26.3* 06/19/2013 1412   HCT 32.8* 06/09/2013 1935   PLT 97* 06/19/2013 1412   PLT PLATELET CLUMPS NOTED ON SMEAR, COUNT APPEARS DECREASED 06/09/2013 1935   MCV 107.3* 06/19/2013 1412   MCV 110.8* 06/09/2013 1935   MCH 36.3*  06/19/2013 1412   MCH 38.9* 06/09/2013 1935   MCHC 33.8 06/19/2013 1412   MCHC 35.1 06/09/2013 1935   RDW 17.3* 06/19/2013 1412   RDW 18.0* 06/09/2013 1935   LYMPHSABS 0.4* 06/19/2013 1412   LYMPHSABS 0.3* 06/09/2013 1935   MONOABS 0.3 06/19/2013 1412   MONOABS 0.2 06/09/2013 1935   EOSABS 0.0 06/19/2013 1412   EOSABS 0.0 06/09/2013 1935   BASOSABS 0.0 06/19/2013 1412   BASOSABS 0.0 06/09/2013 1935    Impression and Plan:   A 62 year old gentleman with the following issues:   1. A history of renal cell carcinoma. He had stage T1b diagnosed in October 2010 of the right kidney, status post right nephrectomy, now presents with a solid tumor of the left kidney. CT scan from 7/22/ 2014 showed regression of his disease in the kidney and the pancreas . He is now on Votrient at total of 400 mg (50% dose reduction) and doing well. The plan is to continue with the current dose and schedule and repeat CT scan in November of 2014.  2. Pancreatic lesion. Biopsy proven to be malignant and likely represent renal cell cancer. Regressed on Votrient.   3. Gout. Unclear that this current pain in his toes is related to gout vs medication effect. I refilled his Vicodin today.   4. Anemia. Hgb is stable. No active bleeding. No transfusion is indicated. We'll consider growth factor support if he becomes symptomatic  5. Increase LFTs: back to normal now. Will continue to follow this.   6. Follow-up. In about 1 month.   Benjiman Core MD 3/11/201410:04 AM

## 2013-07-14 ENCOUNTER — Other Ambulatory Visit: Payer: Self-pay | Admitting: *Deleted

## 2013-07-14 NOTE — Telephone Encounter (Signed)
THIS REFILL REQUEST FOR VOTRIENT WAS PLACED IN DR.SHADAD'S ACTIVE WORK FOLDER. 

## 2013-07-16 ENCOUNTER — Other Ambulatory Visit: Payer: Self-pay | Admitting: *Deleted

## 2013-07-16 DIAGNOSIS — C649 Malignant neoplasm of unspecified kidney, except renal pelvis: Secondary | ICD-10-CM

## 2013-07-16 MED ORDER — PAZOPANIB HCL 200 MG PO TABS: 400.0000 mg | ORAL_TABLET | Freq: Every day | ORAL | Status: DC

## 2013-07-16 NOTE — Telephone Encounter (Signed)
RECEIVED A FAX FROM [facility] CONCERNING A CONFIRMATION OF FACSIMILE RECEIPT FOR PT. REFERRAL. 

## 2013-07-24 ENCOUNTER — Encounter (HOSPITAL_COMMUNITY): Payer: Self-pay

## 2013-07-24 ENCOUNTER — Other Ambulatory Visit (HOSPITAL_BASED_OUTPATIENT_CLINIC_OR_DEPARTMENT_OTHER): Payer: BC Managed Care – PPO

## 2013-07-24 ENCOUNTER — Telehealth: Payer: Self-pay | Admitting: *Deleted

## 2013-07-24 ENCOUNTER — Ambulatory Visit (HOSPITAL_COMMUNITY)
Admission: RE | Admit: 2013-07-24 | Discharge: 2013-07-24 | Disposition: A | Discharge status: Home / Self Care | Payer: BC Managed Care – PPO | Source: Ambulatory Visit | Attending: Oncology | Admitting: Oncology

## 2013-07-24 DIAGNOSIS — K7689 Other specified diseases of liver: Secondary | ICD-10-CM | POA: Insufficient documentation

## 2013-07-24 DIAGNOSIS — Z905 Acquired absence of kidney: Secondary | ICD-10-CM | POA: Insufficient documentation

## 2013-07-24 DIAGNOSIS — C649 Malignant neoplasm of unspecified kidney, except renal pelvis: Secondary | ICD-10-CM

## 2013-07-24 DIAGNOSIS — N289 Disorder of kidney and ureter, unspecified: Secondary | ICD-10-CM | POA: Insufficient documentation

## 2013-07-24 DIAGNOSIS — K5730 Diverticulosis of large intestine without perforation or abscess without bleeding: Secondary | ICD-10-CM | POA: Insufficient documentation

## 2013-07-24 LAB — COMPREHENSIVE METABOLIC PANEL (CC13)
ALT: 23 U/L (ref 0–55)
AST: 42 U/L — ABNORMAL HIGH (ref 5–34)
Albumin: 3.6 g/dL (ref 3.5–5.0)
Alkaline Phosphatase: 92 U/L (ref 40–150)
Anion Gap: 11 mEq/L (ref 3–11)
Glucose: 85 mg/dl (ref 70–140)
Potassium: 3.7 mEq/L (ref 3.5–5.1)
Sodium: 143 mEq/L (ref 136–145)
Total Bilirubin: 1.45 mg/dL — ABNORMAL HIGH (ref 0.20–1.20)
Total Protein: 7.2 g/dL (ref 6.4–8.3)

## 2013-07-24 LAB — CBC WITH DIFFERENTIAL/PLATELET
BASO%: 1 % (ref 0.0–2.0)
EOS%: 3.6 % (ref 0.0–7.0)
Eosinophils Absolute: 0.1 10*3/uL (ref 0.0–0.5)
LYMPH%: 18.1 % (ref 14.0–49.0)
MCHC: 34 g/dL (ref 32.0–36.0)
MCV: 114.1 fL — ABNORMAL HIGH (ref 79.3–98.0)
MONO%: 7.7 % (ref 0.0–14.0)
NEUT#: 1.3 10*3/uL — ABNORMAL LOW (ref 1.5–6.5)
NEUT%: 69.6 % (ref 39.0–75.0)
Platelets: 64 10*3/uL — ABNORMAL LOW (ref 140–400)
RBC: 2.94 10*6/uL — ABNORMAL LOW (ref 4.20–5.82)
RDW: 18.7 % — ABNORMAL HIGH (ref 11.0–14.6)
WBC: 1.9 10*3/uL — ABNORMAL LOW (ref 4.0–10.3)
lymph#: 0.3 10*3/uL — ABNORMAL LOW (ref 0.9–3.3)

## 2013-07-24 MED ORDER — IOHEXOL 300 MG/ML  SOLN
80.0000 mL | Freq: Once | INTRAMUSCULAR | Status: AC | PRN
  Administered 2013-07-24: 100 mL via INTRAVENOUS

## 2013-07-24 NOTE — Telephone Encounter (Signed)
Jeffrey Trujillo with [facility] calling to let us know they have been unable to reach patient before shipping his next cycle of Votrient. Explained to Jeffrey Trujillo that patient will be having labs/CT scan today to re-evaluate treatment today. If she would call us back for new Rx or verification after MD appt on 07/29/13 once MD has made a decision if continuing current treatment.

## 2013-07-29 ENCOUNTER — Encounter (INDEPENDENT_AMBULATORY_CARE_PROVIDER_SITE_OTHER): Payer: Self-pay

## 2013-07-29 ENCOUNTER — Telehealth: Payer: Self-pay | Admitting: Oncology

## 2013-07-29 ENCOUNTER — Ambulatory Visit (HOSPITAL_BASED_OUTPATIENT_CLINIC_OR_DEPARTMENT_OTHER): Payer: BC Managed Care – PPO | Admitting: Oncology

## 2013-07-29 VITALS — BP 120/69 | HR 70 | Temp 97.4°F | Resp 18 | Ht 71.0 in | Wt 170.2 lb

## 2013-07-29 DIAGNOSIS — M109 Gout, unspecified: Secondary | ICD-10-CM

## 2013-07-29 DIAGNOSIS — C649 Malignant neoplasm of unspecified kidney, except renal pelvis: Secondary | ICD-10-CM

## 2013-07-29 DIAGNOSIS — C259 Malignant neoplasm of pancreas, unspecified: Secondary | ICD-10-CM

## 2013-07-29 DIAGNOSIS — R945 Abnormal results of liver function studies: Secondary | ICD-10-CM

## 2013-07-29 DIAGNOSIS — D649 Anemia, unspecified: Secondary | ICD-10-CM

## 2013-07-29 NOTE — Telephone Encounter (Signed)
gv and printed appt sched and avs for pt for DEc °

## 2013-07-29 NOTE — Progress Notes (Signed)
Hematology and Oncology Follow Up Visit  Jeffrey Trujillo [id number] Aug 09, 1951 62 y.o.    Principle Diagnosis: 62 year old with the following issues:  1. T1b right kidney cancer diagnosed in 2010. He is rdical nephrectomy on June 27, 2009. The pathological staging was T1b, was clear cell histology with Fuhrman grade 3/4.  2. Left Kidney mass  Measuring 4.5 x 2 7 x 3.6 irregular enhancing appears to be a new kidney cancer noted in 07/2012. 3. Pancreatic lesion. 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  Current therapy: Votrient  800 mg daily started in December 2013. Treatment held in 11/2012 due to increase LFTs. Votrient was resumed in April 2014 at 400 mg daily.  Interim History: Jeffrey Trujillo presents for a follow up visit. He is a nice man with the above issues. Since his last visit, he have been doing relatively well. He is not reporting any new issues.  He has reported intermittent pain in his big toe on the right; better than last visit. Has been using Norco as needed. He is doing well otherwise with the Votrient. Denies chest pain, shortness of breath, abdominal pain, nausea, vomiting. No diarrhea. No rashes. He has not noticed any bleeding. He reports no new complications from his Votrient. No new rash noted.   Medications:  Current Outpatient Prescriptions  Medication Sig Dispense Refill  . HYDROcodone-acetaminophen (NORCO/VICODIN) 5-325 MG per tablet Take 1 tablet by mouth every 6 (six) hours as needed.  60 tablet  0  . indomethacin (INDOCIN) 50 MG capsule Take 50 mg by mouth daily.      . ondansetron (ZOFRAN) 8 MG tablet Take 1 tablet (8 mg total) by mouth every 8 (eight) hours as needed for nausea.  20 tablet  0  . pantoprazole (PROTONIX) 40 MG tablet Take 1 tablet (40 mg total) by mouth 2 (two) times daily before a meal.  60 tablet  2  . pazopanib (VOTRIENT) 200 MG tablet Take 2 tablets (400 mg total) by mouth daily. Take on an empty stomach. Script faxed to  [facility] 06/05/13.  DKatrinka Blazing rn  60 tablet  0  . topiramate (TOPAMAX) 25 MG tablet Take 25 mg by mouth 2 (two) times daily.       No current facility-administered medications for this visit.     Allergies:  Allergies  Allergen Reactions  . Nsaids Other (See Comments)    Patient only has one kidney.   . Meperidine Hcl Hives and Rash    Past Medical History, Surgical history, Social history, and Family History were reviewed and updated.  Review of Systems:  Remaining ROS negative.  Physical Exam: Blood pressure 143/92, pulse 64, temperature 97 F (36.1 C), temperature source Oral, resp. rate 18, height 5\' 11"  (1.803 m), weight 172 lb 14.4 oz (78.427 kg). ECOG: 1 General appearance: alert Head: Normocephalic, without obvious abnormality, atraumatic Neck: no adenopathy, no carotid bruit, no JVD, supple, symmetrical, trachea midline and thyroid not enlarged, symmetric, no tenderness/mass/nodules Lymph nodes: Cervical, supraclavicular, and axillary nodes normal. Heart:regular rate and rhythm, S1, S2 normal, no murmur, click, rub or gallop Lung:chest clear, no wheezing, rales, normal symmetric air entry Abdomen: soft, non-tender, without masses or organomegaly EXT:no erythema, induration, or nodules. No erythema. Small blister noted under the big toe. Good pluses, no discoloration or rash  noted.     CBC    Component Value Date/Time   WBC 1.9* 07/24/2013 1015   WBC 1.5* 06/09/2013 1935   RBC  2.94* 07/24/2013 1015   RBC 2.96* 06/09/2013 1935   HGB 11.4* 07/24/2013 1015   HGB 11.5* 06/09/2013 1935   HCT 33.5* 07/24/2013 1015   HCT 32.8* 06/09/2013 1935   PLT 64* 07/24/2013 1015   PLT PLATELET CLUMPS NOTED ON SMEAR, COUNT APPEARS DECREASED 06/09/2013 1935   MCV 114.1* 07/24/2013 1015   MCV 110.8* 06/09/2013 1935   MCH 38.9* 07/24/2013 1015   MCH 38.9* 06/09/2013 1935   MCHC 34.0 07/24/2013 1015   MCHC 35.1 06/09/2013 1935   RDW 18.7* 07/24/2013 1015   RDW 18.0* 06/09/2013 1935   LYMPHSABS  0.3* 07/24/2013 1015   LYMPHSABS 0.3* 06/09/2013 1935   MONOABS 0.1 07/24/2013 1015   MONOABS 0.2 06/09/2013 1935   EOSABS 0.1 07/24/2013 1015   EOSABS 0.0 06/09/2013 1935   BASOSABS 0.0 07/24/2013 1015   BASOSABS 0.0 06/09/2013 1935   EXAM:  CT CHEST, ABDOMEN, AND PELVIS WITH CONTRAST  TECHNIQUE:  Multidetector CT imaging of the chest, abdomen and pelvis was  performed following the standard protocol during bolus  administration of intravenous contrast.  CONTRAST: OMNIPAQUE IOHEXOL 300 MG/ML SOLN  COMPARISON: 04/07/2013  FINDINGS:  CT CHEST FINDINGS  Stable post surgical changes noted in the left hemi thorax. No  suspicious pulmonary nodules or masses are identified. No evidence  of pulmonary infiltrate or central endobronchial lesion.  No evidence of pleural or pericardial effusion. No hilar or  mediastinal masses are identified. No lymphadenopathy seen elsewhere  within the thorax. No evidence of chest wall mass or suspicious bone  lesions.  CT ABDOMEN AND PELVIS FINDINGS  Hepatic steatosis again demonstrated. A tiny sub cm cyst in left  hepatic lobe is stable. No liver masses are identified. The  gallbladder is unremarkable in appearance. The pancreas, spleen, and  adrenal glands are normal in appearance.  Previous right nephrectomy noted. No mass is seen in the right  nephrectomy bed. A simple cyst is seen in the upper pole left kidney  which is stable. There is also a subtle solid lesion in the lateral  upper pole of the left kidney which measures 2.2 x 2.3 cm on image  11 of series 5 and is mildly decreased in size compared to previous  study when it measured approximately 2.9 x 2.4 cm. No other left  renal masses are identified and there is no evidence of  hydronephrosis.  No evidence of retroperitoneal adenopathy. No other adenopathy seen  within the abdomen or pelvis. Diverticulosis is again seen  predominately involving the sigmoid colon. No evidence of   diverticulitis or other inflammatory process. No abnormal fluid  collections are seen. No suspicious bone lesions identified.  IMPRESSION:  No evidence of thoracic metastatic disease.  Further decrease in size of small solid mass in the lateral upper  pole of the left kidney.  No lymphadenopathy or other sites of metastatic disease identified  within the abdomen or pelvis.   Impression and Plan:   A 62 year old gentleman with the following issues:   1. A history of renal cell carcinoma. He had stage T1b diagnosed in October 2010 of the right kidney, status post right nephrectomy, now presents with a solid tumor of the left kidney. CT scan from 07/24/2013 was discussed today and showed regression of his disease in the kidney and the pancreas . He is now on Votrient at total of 400 mg (50% dose reduction) and doing well. The plan is to continue with the current dose and schedule and repeat CT  scan in 3-4 months.   2. Pancreatic lesion. Biopsy proven to be malignant and likely represent renal cell cancer. Regressed on Votrient.   3. Gout. Unclear that this current pain in his toes is related to gout vs medication effect. I refilled his Vicodin today.   4. Anemia. Hgb is stable. No active bleeding. No transfusion is indicated. We'll consider growth factor support if he becomes symptomatic  5. Increase LFTs: back to normal now. Will continue to follow this.   6. Follow-up. In about 1 month.   Benjiman Core MD 07/29/2013

## 2013-07-30 NOTE — Telephone Encounter (Signed)
RECEIVED A FAX FROM [facility] CONCERNING A CONFIRMATION OF PRESCRIPTION SHIPMENT FOR VOTRIENT ON 07/29/13.

## 2013-07-31 ENCOUNTER — Encounter: Payer: Self-pay | Admitting: Oncology

## 2013-07-31 NOTE — Progress Notes (Signed)
Patient wife called and left message to see if we take [place]. I called and spoke with her to make sure all of their drs are in network.

## 2013-08-21 ENCOUNTER — Other Ambulatory Visit: Payer: Self-pay | Admitting: *Deleted

## 2013-08-21 DIAGNOSIS — C649 Malignant neoplasm of unspecified kidney, except renal pelvis: Secondary | ICD-10-CM

## 2013-08-21 MED ORDER — PAZOPANIB HCL 200 MG PO TABS: 400.0000 mg | ORAL_TABLET | Freq: Every day | ORAL | Status: DC

## 2013-08-21 NOTE — Telephone Encounter (Signed)
THIS REFILL REQUEST FOR VOTRIENT WAS PLACED IN DR.SHADAD'S ACTIVE WORK FOLDER. 

## 2013-08-28 NOTE — Telephone Encounter (Signed)
RECEIVED A FAX FROM [facility] CONCERNING A CONFIRMATION OF PRESCRIPTION SHIPMENT FOR VOTRIENT ON 08/27/13. 

## 2013-09-07 ENCOUNTER — Other Ambulatory Visit (HOSPITAL_BASED_OUTPATIENT_CLINIC_OR_DEPARTMENT_OTHER): Payer: PRIVATE HEALTH INSURANCE

## 2013-09-07 ENCOUNTER — Encounter: Payer: Self-pay | Admitting: Oncology

## 2013-09-07 ENCOUNTER — Ambulatory Visit (HOSPITAL_BASED_OUTPATIENT_CLINIC_OR_DEPARTMENT_OTHER): Payer: PRIVATE HEALTH INSURANCE | Admitting: Oncology

## 2013-09-07 ENCOUNTER — Telehealth: Payer: Self-pay | Admitting: Oncology

## 2013-09-07 ENCOUNTER — Encounter (INDEPENDENT_AMBULATORY_CARE_PROVIDER_SITE_OTHER): Payer: Self-pay

## 2013-09-07 VITALS — BP 150/95 | HR 85 | Temp 98.0°F | Resp 18 | Ht 71.0 in | Wt 173.0 lb

## 2013-09-07 DIAGNOSIS — C7889 Secondary malignant neoplasm of other digestive organs: Secondary | ICD-10-CM

## 2013-09-07 DIAGNOSIS — D649 Anemia, unspecified: Secondary | ICD-10-CM

## 2013-09-07 DIAGNOSIS — C649 Malignant neoplasm of unspecified kidney, except renal pelvis: Secondary | ICD-10-CM

## 2013-09-07 LAB — COMPREHENSIVE METABOLIC PANEL (CC13)
Albumin: 3.4 g/dL — ABNORMAL LOW (ref 3.5–5.0)
Alkaline Phosphatase: 92 U/L (ref 40–150)
BUN: 9.9 mg/dL (ref 7.0–26.0)
Creatinine: 1.1 mg/dL (ref 0.7–1.3)
Glucose: 87 mg/dl (ref 70–140)
Potassium: 3.9 mEq/L (ref 3.5–5.1)
Sodium: 143 mEq/L (ref 136–145)
Total Bilirubin: 1.08 mg/dL (ref 0.20–1.20)

## 2013-09-07 LAB — CBC WITH DIFFERENTIAL/PLATELET
Basophils Absolute: 0 10*3/uL (ref 0.0–0.1)
Eosinophils Absolute: 0.1 10*3/uL (ref 0.0–0.5)
HCT: 32.7 % — ABNORMAL LOW (ref 38.4–49.9)
HGB: 11.2 g/dL — ABNORMAL LOW (ref 13.0–17.1)
LYMPH%: 13.2 % — ABNORMAL LOW (ref 14.0–49.0)
MCH: 40.2 pg — ABNORMAL HIGH (ref 27.2–33.4)
MCV: 117.5 fL — ABNORMAL HIGH (ref 79.3–98.0)
MONO#: 0.4 10*3/uL (ref 0.1–0.9)
MONO%: 12.2 % (ref 0.0–14.0)
NEUT#: 2.6 10*3/uL (ref 1.5–6.5)
NEUT%: 72.6 % (ref 39.0–75.0)
Platelets: 80 10*3/uL — ABNORMAL LOW (ref 140–400)
RDW: 16.8 % — ABNORMAL HIGH (ref 11.0–14.6)
WBC: 3.6 10*3/uL — ABNORMAL LOW (ref 4.0–10.3)

## 2013-09-07 NOTE — Progress Notes (Signed)
Hematology and Oncology Follow Up Visit  Jeffrey Trujillo [id number] [birth year] 62 y.o.    Principle Diagnosis: 61 year old with the following issues:  1. T1b right kidney cancer diagnosed in 2010. He is rdical nephrectomy on June 27, 2009. The pathological staging was T1b, was clear cell histology with Fuhrman grade 3/4.  2. Left Kidney mass  Measuring 4.5 x 2 7 x 3.6 irregular enhancing appears to be a new kidney cancer noted in 07/2012. 3. Pancreatic lesion. 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  Current therapy: Votrient  800 mg daily started in December 2013. Treatment held in 11/2012 due to increase LFTs. Votrient was resumed in April 2014 at 400 mg daily.  Interim History: Jeffrey Trujillo presents for a follow up visit. He is a nice man with the above issues. Since his last visit, he have been doing relatively well. He is not reporting any new issues.  He has reported intermittent pain in his big toe on the right; better than last visit. Has been using Norco as needed. He is doing well otherwise with the Votrient. Denies chest pain, shortness of breath, abdominal pain, nausea, vomiting. No diarrhea. No rashes. He has not noticed any bleeding. He reports no new complications from his Votrient. No new rash noted.   Medications:  Current Outpatient Prescriptions  Medication Sig Dispense Refill  . HYDROcodone-acetaminophen (NORCO/VICODIN) 5-325 MG per tablet Take 1 tablet by mouth every 6 (six) hours as needed.  60 tablet  0  . indomethacin (INDOCIN) 50 MG capsule Take 50 mg by mouth daily.      . ondansetron (ZOFRAN) 8 MG tablet Take 1 tablet (8 mg total) by mouth every 8 (eight) hours as needed for nausea.  20 tablet  0  . pantoprazole (PROTONIX) 40 MG tablet Take 1 tablet (40 mg total) by mouth 2 (two) times daily before a meal.  60 tablet  2  . pazopanib (VOTRIENT) 200 MG tablet Take 2 tablets (400 mg total) by mouth daily. Take on an empty stomach.  60 tablet  0  .  topiramate (TOPAMAX) 25 MG tablet Take 25 mg by mouth 2 (two) times daily.       No current facility-administered medications for this visit.     Allergies:  Allergies  Allergen Reactions  . Nsaids Other (See Comments)    Patient only has one kidney.   . Meperidine Hcl Hives and Rash    Past Medical History, Surgical history, Social history, and Family History were reviewed and updated.  Review of Systems:  Remaining ROS negative.  Physical Exam: Blood pressure 143/92, pulse 64, temperature 97 F (36.1 C), temperature source Oral, resp. rate 18, height 5\' 11"  (1.803 m), weight 172 lb 14.4 oz (78.427 kg). ECOG: 1 General appearance: alert Head: Normocephalic, without obvious abnormality, atraumatic Neck: no adenopathy, no carotid bruit, no JVD, supple, symmetrical, trachea midline and thyroid not enlarged, symmetric, no tenderness/mass/nodules Lymph nodes: Cervical, supraclavicular, and axillary nodes normal. Heart:regular rate and rhythm, S1, S2 normal, no murmur, click, rub or gallop Lung:chest clear, no wheezing, rales, normal symmetric air entry Abdomen: soft, non-tender, without masses or organomegaly EXT:no erythema, induration, or nodules. No erythema. Good pluses, no discoloration or rash  noted.     CBC    Component Value Date/Time   WBC 3.6* 09/07/2013 1446   WBC 1.5* 06/09/2013 1935   RBC 2.78* 09/07/2013 1446   RBC 2.96* 06/09/2013 1935   HGB 11.2* 09/07/2013 1446  HGB 11.5* 06/09/2013 1935   HCT 32.7* 09/07/2013 1446   HCT 32.8* 06/09/2013 1935   PLT 80* 09/07/2013 1446   PLT PLATELET CLUMPS NOTED ON SMEAR, COUNT APPEARS DECREASED 06/09/2013 1935   MCV 117.5* 09/07/2013 1446   MCV 110.8* 06/09/2013 1935   MCH 40.2* 09/07/2013 1446   MCH 38.9* 06/09/2013 1935   MCHC 34.2 09/07/2013 1446   MCHC 35.1 06/09/2013 1935   RDW 16.8* 09/07/2013 1446   RDW 18.0* 06/09/2013 1935   LYMPHSABS 0.5* 09/07/2013 1446   LYMPHSABS 0.3* 06/09/2013 1935   MONOABS 0.4 09/07/2013  1446   MONOABS 0.2 06/09/2013 1935   EOSABS 0.1 09/07/2013 1446   EOSABS 0.0 06/09/2013 1935   BASOSABS 0.0 09/07/2013 1446   BASOSABS 0.0 06/09/2013 1935    Impression and Plan:   A 62 year old gentleman with the following issues:   1. A history of renal cell carcinoma. He had stage T1b diagnosed in October 2010 of the right kidney, status post right nephrectomy, now presents with a solid tumor of the left kidney. CT scan from 07/24/2013 showed regression of his disease in the kidney and the pancreas . He is now on Votrient at total of 400 mg (50% dose reduction) and doing well. The plan is to continue with the current dose and schedule and repeat CT scan in 2-3 months.   2. Pancreatic lesion. Biopsy proven to be malignant and likely represent renal cell cancer. Regressed on Votrient.   3. Gout. No recent flares. He is on Indomethacin.   4. Anemia. Hgb is stable. No active bleeding. No transfusion is indicated. We will consider growth factor support if he becomes symptomatic  5. Increase LFTs: back to normal now. Will continue to follow this.   6. Follow-up. In about 1 month.   [name] NP 09/07/2013

## 2013-09-07 NOTE — Telephone Encounter (Signed)
gv pt appt schedule for jan

## 2013-09-22 ENCOUNTER — Other Ambulatory Visit: Payer: Self-pay | Admitting: *Deleted

## 2013-09-22 DIAGNOSIS — C649 Malignant neoplasm of unspecified kidney, except renal pelvis: Secondary | ICD-10-CM

## 2013-09-22 MED ORDER — PAZOPANIB HCL 200 MG PO TABS: 400.0000 mg | ORAL_TABLET | Freq: Every day | ORAL | Status: DC

## 2013-09-22 NOTE — Telephone Encounter (Signed)
THIS REFILL REQUEST FOR VOTRIENT WAS PLACED IN DR.SHADAD'S ACTIVE WORK FOLDER. 

## 2013-09-23 NOTE — Telephone Encounter (Signed)
Faxed notification from [facility] that script for Votrient has been received and is in insurance verification process.

## 2013-10-01 ENCOUNTER — Telehealth: Payer: Self-pay | Admitting: *Deleted

## 2013-10-01 ENCOUNTER — Other Ambulatory Visit: Payer: Self-pay | Admitting: *Deleted

## 2013-10-01 DIAGNOSIS — C649 Malignant neoplasm of unspecified kidney, except renal pelvis: Secondary | ICD-10-CM

## 2013-10-01 MED ORDER — PAZOPANIB HCL 200 MG PO TABS: 400.0000 mg | ORAL_TABLET | Freq: Every day | ORAL | Status: DC

## 2013-10-01 NOTE — Telephone Encounter (Signed)
Received faxed notification from [employer] of approval for Votrient .  Gave info to medical records to scan in EPIC.

## 2013-10-01 NOTE — Telephone Encounter (Signed)
rec'd call from [facility]. Patient's insurance denied votrient, it needs to go to [facility] [facility]. E-scribed votrient script to them. Faxed demographics and last office visit note. [phone]

## 2013-10-01 NOTE — Telephone Encounter (Signed)
Prior authorization request received from [place].  Request to Managed Care.

## 2013-10-16 ENCOUNTER — Telehealth: Payer: Self-pay | Admitting: Oncology

## 2013-10-16 ENCOUNTER — Ambulatory Visit: Payer: PRIVATE HEALTH INSURANCE | Admitting: Oncology

## 2013-10-16 ENCOUNTER — Other Ambulatory Visit: Payer: PRIVATE HEALTH INSURANCE

## 2013-10-16 NOTE — Telephone Encounter (Signed)
Pt called and r/s to 2/11 and nurse notified

## 2013-10-22 ENCOUNTER — Other Ambulatory Visit: Payer: Self-pay | Admitting: *Deleted

## 2013-10-22 DIAGNOSIS — C649 Malignant neoplasm of unspecified kidney, except renal pelvis: Secondary | ICD-10-CM

## 2013-10-22 MED ORDER — PAZOPANIB HCL 200 MG PO TABS: 400.0000 mg | ORAL_TABLET | Freq: Every day | ORAL | Status: DC

## 2013-10-28 ENCOUNTER — Telehealth: Payer: Self-pay | Admitting: Oncology

## 2013-10-28 ENCOUNTER — Other Ambulatory Visit (HOSPITAL_BASED_OUTPATIENT_CLINIC_OR_DEPARTMENT_OTHER): Payer: PRIVATE HEALTH INSURANCE

## 2013-10-28 ENCOUNTER — Ambulatory Visit (HOSPITAL_BASED_OUTPATIENT_CLINIC_OR_DEPARTMENT_OTHER): Payer: PRIVATE HEALTH INSURANCE | Admitting: Oncology

## 2013-10-28 ENCOUNTER — Encounter: Payer: Self-pay | Admitting: Oncology

## 2013-10-28 VITALS — BP 152/73 | HR 55 | Temp 97.0°F | Resp 18 | Ht 71.0 in | Wt 170.5 lb

## 2013-10-28 DIAGNOSIS — C259 Malignant neoplasm of pancreas, unspecified: Secondary | ICD-10-CM

## 2013-10-28 DIAGNOSIS — D649 Anemia, unspecified: Secondary | ICD-10-CM

## 2013-10-28 DIAGNOSIS — C649 Malignant neoplasm of unspecified kidney, except renal pelvis: Secondary | ICD-10-CM

## 2013-10-28 DIAGNOSIS — D696 Thrombocytopenia, unspecified: Secondary | ICD-10-CM

## 2013-10-28 LAB — CBC WITH DIFFERENTIAL/PLATELET
BASO%: 0.4 % (ref 0.0–2.0)
BASOS ABS: 0 10*3/uL (ref 0.0–0.1)
EOS%: 1.1 % (ref 0.0–7.0)
Eosinophils Absolute: 0 10*3/uL (ref 0.0–0.5)
HCT: 30.2 % — ABNORMAL LOW (ref 38.4–49.9)
HGB: 10.3 g/dL — ABNORMAL LOW (ref 13.0–17.1)
LYMPH#: 0.3 10*3/uL — AB (ref 0.9–3.3)
LYMPH%: 13.2 % — ABNORMAL LOW (ref 14.0–49.0)
MCH: 39.3 pg — ABNORMAL HIGH (ref 27.2–33.4)
MCHC: 34 g/dL (ref 32.0–36.0)
MCV: 115.5 fL — AB (ref 79.3–98.0)
MONO#: 0.4 10*3/uL (ref 0.1–0.9)
MONO%: 15.6 % — ABNORMAL HIGH (ref 0.0–14.0)
NEUT#: 1.8 10*3/uL (ref 1.5–6.5)
NEUT%: 69.7 % (ref 39.0–75.0)
Platelets: 55 10*3/uL — ABNORMAL LOW (ref 140–400)
RBC: 2.62 10*6/uL — ABNORMAL LOW (ref 4.20–5.82)
RDW: 16 % — AB (ref 11.0–14.6)
WBC: 2.5 10*3/uL — ABNORMAL LOW (ref 4.0–10.3)

## 2013-10-28 LAB — COMPREHENSIVE METABOLIC PANEL (CC13)
ALK PHOS: 87 U/L (ref 40–150)
ALT: 32 U/L (ref 0–55)
AST: 53 U/L — AB (ref 5–34)
Albumin: 3.5 g/dL (ref 3.5–5.0)
Anion Gap: 7 mEq/L (ref 3–11)
BUN: 16.9 mg/dL (ref 7.0–26.0)
CALCIUM: 8.6 mg/dL (ref 8.4–10.4)
CHLORIDE: 107 meq/L (ref 98–109)
CO2: 27 mEq/L (ref 22–29)
CREATININE: 1.2 mg/dL (ref 0.7–1.3)
Glucose: 107 mg/dl (ref 70–140)
Potassium: 3.9 mEq/L (ref 3.5–5.1)
Sodium: 141 mEq/L (ref 136–145)
Total Bilirubin: 1.28 mg/dL — ABNORMAL HIGH (ref 0.20–1.20)
Total Protein: 6.6 g/dL (ref 6.4–8.3)

## 2013-10-28 NOTE — Progress Notes (Signed)
Hematology and Oncology Follow Up Visit  Jeffrey Trujillo [id number] 1951-02-18 63 y.o.    Principle Diagnosis: 63 year old with the following issues:  1. T1b right kidney cancer diagnosed in 2010. He is rdical nephrectomy on June 27, 2009. The pathological staging was T1b, was clear cell histology with Fuhrman grade 3/4.  2. Left Kidney mass  Measuring 4.5 x 2 7 x 3.6 irregular enhancing appears to be a new kidney cancer noted in 07/2012. 3. Pancreatic lesion. 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  Current therapy: Votrient  800 mg daily started in December 2013. Treatment held in 11/2012 due to increase LFTs. Votrient was resumed in April 2014 at 400 mg daily.  Interim History: Jeffrey Trujillo presents for a follow up visit. He is a nice man with advanced kidney cancer. Since his last visit, he have been doing relatively well. He is not reporting any new issues.  He has reported intermittent pain in his big toe on the right; better than last visit. Has been using Norco as needed. He is doing well otherwise with the Votrient. Denies chest pain, shortness of breath, abdominal pain, nausea, vomiting. No diarrhea. No rashes. He has not noticed any bleeding. He reports no new complications from his Votrient. No new rash noted. He continues to work full-time without any delay or attendance. He has not reported any constitutional symptoms appetite changes although he dropped a few pounds since the last visit.  Medications:  Current Outpatient Prescriptions  Medication Sig Dispense Refill  . pantoprazole (PROTONIX) 40 MG tablet Take 1 tablet (40 mg total) by mouth 2 (two) times daily before a meal.  60 tablet  2  . pazopanib (VOTRIENT) 200 MG tablet Take 2 tablets (400 mg total) by mouth daily. Take on an empty stomach.  60 tablet  0  . topiramate (TOPAMAX) 25 MG tablet Take 25 mg by mouth 2 (two) times daily.       No current facility-administered medications for this visit.      Allergies:  Allergies  Allergen Reactions  . Nsaids Other (See Comments)    Patient only has one kidney.   . Meperidine Hcl Hives and Rash    Past Medical History, Surgical history, Social history, and Family History were reviewed and updated.  Review of Systems:  Remaining ROS negative.  Physical Exam: Blood pressure 143/92, pulse 64, temperature 97 F (36.1 C), temperature source Oral, resp. rate 18, height 5\' 11"  (1.803 m), weight 172 lb 14.4 oz (78.427 kg). ECOG: 1 General appearance: alert Head: Normocephalic, without obvious abnormality, atraumatic Neck: no adenopathy, no carotid bruit, no JVD, supple, symmetrical, trachea midline and thyroid not enlarged, symmetric, no tenderness/mass/nodules Lymph nodes: Cervical, supraclavicular, and axillary nodes normal. Heart:regular rate and rhythm, S1, S2 normal, no murmur, click, rub or gallop Lung:chest clear, no wheezing, rales, normal symmetric air entry Abdomen: soft, non-tender, without masses or organomegaly EXT:no erythema, induration, or nodules. No erythema. Good pluses, no discoloration or rash  noted.     CBC    Component Value Date/Time   WBC 2.5* 10/28/2013 1250   WBC 1.5* 06/09/2013 1935   RBC 2.62* 10/28/2013 1250   RBC 2.96* 06/09/2013 1935   HGB 10.3* 10/28/2013 1250   HGB 11.5* 06/09/2013 1935   HCT 30.2* 10/28/2013 1250   HCT 32.8* 06/09/2013 1935   PLT 55* 10/28/2013 1250   PLT PLATELET CLUMPS NOTED ON SMEAR, COUNT APPEARS DECREASED 06/09/2013 1935   MCV 115.5* 10/28/2013  1250   MCV 110.8* 06/09/2013 1935   MCH 39.3* 10/28/2013 1250   MCH 38.9* 06/09/2013 1935   MCHC 34.0 10/28/2013 1250   MCHC 35.1 06/09/2013 1935   RDW 16.0* 10/28/2013 1250   RDW 18.0* 06/09/2013 1935   LYMPHSABS 0.3* 10/28/2013 1250   LYMPHSABS 0.3* 06/09/2013 1935   MONOABS 0.4 10/28/2013 1250   MONOABS 0.2 06/09/2013 1935   EOSABS 0.0 10/28/2013 1250   EOSABS 0.0 06/09/2013 1935   BASOSABS 0.0 10/28/2013 1250   BASOSABS 0.0 06/09/2013 1935     Impression and Plan:   A 63 year old gentleman with the following issues:   1. A history of renal cell carcinoma. He had stage T1b diagnosed in October 2010 of the right kidney, status post right nephrectomy, now presents with a solid tumor of the left kidney. CT scan from 07/24/2013 showed regression of his disease in the kidney and the pancreas . He is now on Votrient at total of 400 mg (50% dose reduction) and doing well. The plan is to continue with the current dose and schedule and repeat CT scan next month.  2. Pancreatic lesion. Biopsy proven to be malignant and likely represent renal cell cancer. Regressed on Votrient.   3. thrombocytopenia: This is related to his outpatient we'll continue to monitor closely. He has no signs or symptoms of bleeding.  4. Anemia. Hgb is stable. No active bleeding. No transfusion is indicated. We will consider growth factor support if he becomes symptomatic  5. Increase LFTs: back to normal now. Will continue to follow this.   6. Follow-up. In about 1 month.   Jeffrey Portela MD 10/28/2013

## 2013-10-28 NOTE — Telephone Encounter (Signed)
gv and pritned aptp sched and avs forpt for March...gv pt barium

## 2013-11-25 ENCOUNTER — Encounter (HOSPITAL_COMMUNITY): Payer: Self-pay

## 2013-11-25 ENCOUNTER — Ambulatory Visit (HOSPITAL_COMMUNITY)
Admission: RE | Admit: 2013-11-25 | Discharge: 2013-11-25 | Disposition: A | Discharge status: Home / Self Care | Payer: PRIVATE HEALTH INSURANCE | Source: Ambulatory Visit | Attending: Oncology | Admitting: Oncology

## 2013-11-25 ENCOUNTER — Other Ambulatory Visit: Payer: Self-pay | Admitting: Oncology

## 2013-11-25 ENCOUNTER — Other Ambulatory Visit (HOSPITAL_BASED_OUTPATIENT_CLINIC_OR_DEPARTMENT_OTHER): Payer: PRIVATE HEALTH INSURANCE

## 2013-11-25 DIAGNOSIS — D649 Anemia, unspecified: Secondary | ICD-10-CM

## 2013-11-25 DIAGNOSIS — K7689 Other specified diseases of liver: Secondary | ICD-10-CM | POA: Insufficient documentation

## 2013-11-25 DIAGNOSIS — C649 Malignant neoplasm of unspecified kidney, except renal pelvis: Secondary | ICD-10-CM | POA: Insufficient documentation

## 2013-11-25 LAB — CBC WITH DIFFERENTIAL/PLATELET
BASO%: 0.4 % (ref 0.0–2.0)
BASOS ABS: 0 10*3/uL (ref 0.0–0.1)
EOS ABS: 0.1 10*3/uL (ref 0.0–0.5)
EOS%: 1.4 % (ref 0.0–7.0)
HCT: 34.5 % — ABNORMAL LOW (ref 38.4–49.9)
HGB: 11.7 g/dL — ABNORMAL LOW (ref 13.0–17.1)
LYMPH%: 12.7 % — ABNORMAL LOW (ref 14.0–49.0)
MCH: 38.2 pg — ABNORMAL HIGH (ref 27.2–33.4)
MCHC: 33.9 g/dL (ref 32.0–36.0)
MCV: 112.7 fL — AB (ref 79.3–98.0)
MONO#: 0.4 10*3/uL (ref 0.1–0.9)
MONO%: 10.8 % (ref 0.0–14.0)
NEUT%: 74.7 % (ref 39.0–75.0)
NEUTROS ABS: 2.8 10*3/uL (ref 1.5–6.5)
PLATELETS: 95 10*3/uL — AB (ref 140–400)
RBC: 3.06 10*6/uL — ABNORMAL LOW (ref 4.20–5.82)
RDW: 14.9 % — AB (ref 11.0–14.6)
WBC: 3.8 10*3/uL — ABNORMAL LOW (ref 4.0–10.3)
lymph#: 0.5 10*3/uL — ABNORMAL LOW (ref 0.9–3.3)

## 2013-11-25 LAB — COMPREHENSIVE METABOLIC PANEL (CC13)
ALBUMIN: 4 g/dL (ref 3.5–5.0)
ALK PHOS: 88 U/L (ref 40–150)
ALT: 27 U/L (ref 0–55)
AST: 43 U/L — ABNORMAL HIGH (ref 5–34)
Anion Gap: 9 mEq/L (ref 3–11)
BUN: 9.7 mg/dL (ref 7.0–26.0)
CALCIUM: 9.2 mg/dL (ref 8.4–10.4)
CO2: 25 mEq/L (ref 22–29)
Chloride: 107 mEq/L (ref 98–109)
Creatinine: 1.2 mg/dL (ref 0.7–1.3)
Glucose: 92 mg/dl (ref 70–140)
POTASSIUM: 4.2 meq/L (ref 3.5–5.1)
Sodium: 140 mEq/L (ref 136–145)
Total Bilirubin: 1.22 mg/dL — ABNORMAL HIGH (ref 0.20–1.20)
Total Protein: 7.6 g/dL (ref 6.4–8.3)

## 2013-11-25 MED ORDER — IOHEXOL 300 MG/ML  SOLN
100.0000 mL | Freq: Once | INTRAMUSCULAR | Status: AC | PRN
  Administered 2013-11-25: 100 mL via INTRAVENOUS

## 2013-12-02 ENCOUNTER — Telehealth: Payer: Self-pay | Admitting: Oncology

## 2013-12-02 ENCOUNTER — Encounter: Payer: Self-pay | Admitting: Oncology

## 2013-12-02 ENCOUNTER — Ambulatory Visit (HOSPITAL_BASED_OUTPATIENT_CLINIC_OR_DEPARTMENT_OTHER): Payer: PRIVATE HEALTH INSURANCE | Admitting: Oncology

## 2013-12-02 VITALS — BP 149/78 | HR 66 | Temp 97.9°F | Resp 18 | Ht 71.0 in | Wt 171.4 lb

## 2013-12-02 DIAGNOSIS — D649 Anemia, unspecified: Secondary | ICD-10-CM

## 2013-12-02 DIAGNOSIS — D696 Thrombocytopenia, unspecified: Secondary | ICD-10-CM

## 2013-12-02 DIAGNOSIS — C649 Malignant neoplasm of unspecified kidney, except renal pelvis: Secondary | ICD-10-CM

## 2013-12-02 DIAGNOSIS — C7889 Secondary malignant neoplasm of other digestive organs: Secondary | ICD-10-CM

## 2013-12-02 NOTE — Telephone Encounter (Signed)
gv adn printed appt sched and avs for pt for April  °

## 2013-12-02 NOTE — Progress Notes (Signed)
Patient brought in forms and documentation of financial status for medication assistance ([place]) for Votrient, placed in [facility], managed care, inbox.

## 2013-12-02 NOTE — Progress Notes (Signed)
Hematology and Oncology Follow Up Visit  Jeffrey Trujillo [id number] 26-Mar-1951 63 y.o.    Principle Diagnosis: 63 year old with the following issues:  1. T1b right kidney cancer diagnosed in 2010. He is rdical nephrectomy on June 27, 2009. The pathological staging was T1b, was clear cell histology with Fuhrman grade 3/4.  2. Left Kidney mass  Measuring 4.5 x 2 7 x 3.6 irregular enhancing appears to be a new kidney cancer noted in 07/2012. 3. Pancreatic lesion. 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  Current therapy: Votrient  800 mg daily started in December 2013. Treatment held in 11/2012 due to increase LFTs. Votrient was resumed in April 2014 at 400 mg daily.  Interim History: [name] presents for a follow up visit. Since his last visit, he have been doing  well. He is not reporting any new issues.  He has reported intermittent pain in his big toe on the right; better than last visit. Has been using Norco as needed. He is doing well otherwise with the Votrient. Denies chest pain, shortness of breath, abdominal pain, nausea, vomiting. No diarrhea. No rashes. He has not noticed any bleeding. He reports no new complications from his Votrient. No new rash noted. He continues to work full-time without any delay or attendance. He has not reported any constitutional symptom. Appetite has improved some and his weight continue to be stable. He does report loose bowel habits but have been manageable without any significant diarrhea.  Medications:  Current Outpatient Prescriptions  Medication Sig Dispense Refill  . pantoprazole (PROTONIX) 40 MG tablet Take 1 tablet (40 mg total) by mouth 2 (two) times daily before a meal.  60 tablet  2  . pazopanib (VOTRIENT) 200 MG tablet Take 2 tablets (400 mg total) by mouth daily. Take on an empty stomach.  60 tablet  0  . topiramate (TOPAMAX) 25 MG tablet Take 25 mg by mouth 2 (two) times daily.       No current facility-administered  medications for this visit.     Allergies:  Allergies  Allergen Reactions  . Nsaids Other (See Comments)    Patient only has one kidney.   . Meperidine Hcl Hives and Rash    Past Medical History, Surgical history, Social history, and Family History were reviewed and updated.  Review of Systems:  Remaining ROS negative.  Physical Exam: Blood pressure 143/92, pulse 64, temperature 97 F (36.1 C), temperature source Oral, resp. rate 18, height 5\' 11"  (1.803 m), weight 172 lb 14.4 oz (78.427 kg). ECOG: 1 General appearance: alert and awake appeared in no active distress. Head: Normocephalic, without obvious abnormality, atraumatic Neck: no adenopathy, no carotid bruit, no JVD, supple, symmetrical, trachea midline and thyroid not enlarged, symmetric, no tenderness/mass/nodules Lymph nodes: Cervical, supraclavicular, and axillary nodes normal. Heart:regular rate and rhythm, S1, S2 normal, no murmur, click, rub or gallop Lung:chest clear, no wheezing, rales, normal symmetric air entry Abdomen: soft, non-tender, without masses or organomegaly EXT:no erythema, induration, or nodules. No erythema. Good pluses, no discoloration or rash noted.     CBC    Component Value Date/Time   WBC 3.8* 11/25/2013 1251   WBC 1.5* 06/09/2013 1935   RBC 3.06* 11/25/2013 1251   RBC 2.96* 06/09/2013 1935   HGB 11.7* 11/25/2013 1251   HGB 11.5* 06/09/2013 1935   HCT 34.5* 11/25/2013 1251   HCT 32.8* 06/09/2013 1935   PLT 95* 11/25/2013 1251   PLT PLATELET CLUMPS NOTED ON SMEAR,  COUNT APPEARS DECREASED 06/09/2013 1935   MCV 112.7* 11/25/2013 1251   MCV 110.8* 06/09/2013 1935   MCH 38.2* 11/25/2013 1251   MCH 38.9* 06/09/2013 1935   MCHC 33.9 11/25/2013 1251   MCHC 35.1 06/09/2013 1935   RDW 14.9* 11/25/2013 1251   RDW 18.0* 06/09/2013 1935   LYMPHSABS 0.5* 11/25/2013 1251   LYMPHSABS 0.3* 06/09/2013 1935   MONOABS 0.4 11/25/2013 1251   MONOABS 0.2 06/09/2013 1935   EOSABS 0.1 11/25/2013 1251   EOSABS 0.0  06/09/2013 1935   BASOSABS 0.0 11/25/2013 1251   BASOSABS 0.0 06/09/2013 1935    EXAM  CT CHEST WITH CONTRAST  CT ABDOMEN AND PELVIS WITH AND WITHOUT CONTRAST  TECHNIQUE  Multidetector CT imaging of the chest was performed during  intravenous contrast administration. Multidetector CT imaging of the  abdomen and pelvis was performed following the standard protocol  before and during bolus administration of intravenous contrast.  CONTRAST  132mL OMNIPAQUE IOHEXOL 300 MG/ML SOLN  COMPARISON  CT CHEST W/CM dated 07/24/2013; CT ABD/PELVIS W CM dated 07/24/2013;  CT ABD - PELV W/ CM dated 03/10/2013; MR ABDOMEN WO/W CM dated  07/18/2012  FINDINGS  CT CHEST FINDINGS  Lungs/Pleura: Volume loss in the left lower lobe. Minimal thickening  along the left major fissure superiorly. This is unchanged on image  35. No pleural fluid. Minimal similar left pleural thickening.  Heart/Mediastinum: No supraclavicular adenopathy. Tiny low-density  thyroid nodules which are nonspecific. Heart size upper normal,  without pericardial effusion. No central pulmonary embolism, on this  non-dedicated study. No mediastinal or hilar adenopathy.  CT ABDOMEN AND PELVIS FINDINGS  Abdomen/Pelvis: No renal calculi or abdominal ureteric calculi on  unenhanced imaging. No hypervascular lesions within the liver,  pancreas on arterial phase images.  Portal venous phase images demonstrate mild hepatic steatosis. A too  small to characterize left hepatic lobe lesion at 6 mm on image  86/series 6. This has been present back to 03/10/2013, and was  consistent with a cyst on remote prior MRI.  A splenule. Normal stomach, pancreas, biliary tract, adrenal glands.  Suspicion of cholelithiasis. Status post right nephrectomy, without  locally recurrent disease.  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 An upper pole left renal cyst.  Lateral upper pole left renal mass measures 2.4 x 2.4 cm on  image  120 of series 6. Similar to minimally enlarged from the prior exam,  where it was measured at 2.2 x 2.3 cm. No new renal lesions.  No retroperitoneal or retrocrural adenopathy. Extensive colonic  diverticulosis. Normal small bowel without abdominal ascites.  No pelvic adenopathy. Normal urinary bladder and prostate. Tiny  urachal remnant incidentally noted. No significant free fluid.  Bones/Musculoskeletal: Left-sided rib defect which is likely  postsurgical.  IMPRESSION  1. Similar to minimal increase in size of a solid upper pole left  renal lesion.  2. No evidence of abdominal nodal or distant metastasis.  3. Right nephrectomy, without locally recurrent disease.  4. Hepatic steatosis.  5. No acute process or evidence of metastatic disease in the chest.  6. Suspicion of cholelithiasis.     Impression and Plan:   A 63 year old gentleman with the following issues:   1. A history of renal cell carcinoma. He had stage T1b diagnosed in October 2010 of the right kidney, status post right nephrectomy, now presents with a solid tumor of the left kidney as well as pancreatic metastasis.CT scan from 11/25/2013 reviewed with the  patient today and showed stable disease in the kidney and the pancreas . He is now on Votrient at total of 400 mg (50% dose reduction) and doing well. The plan is to continue with the current dose and schedule and repeat CT scan in 4 months.  2. Pancreatic lesion. Biopsy proven to be malignant and likely represent renal cell cancer. Regressed on Votrient.   3. Thrombocytopenia: This is related to Votrient has improved we'll continue to monitor closely. He has no signs or symptoms of bleeding.  4. Anemia. Hgb is stable. No active bleeding. No transfusion is indicated. We will consider growth factor support if he becomes symptomatic  5. Increase LFTs: back to normal now. Will continue to follow this.   6. Follow-up. In about 4-5 weeks.   [name]  MD 12/02/2013

## 2013-12-04 ENCOUNTER — Other Ambulatory Visit: Payer: Self-pay | Admitting: *Deleted

## 2013-12-04 ENCOUNTER — Other Ambulatory Visit: Payer: Self-pay | Admitting: Medical Oncology

## 2013-12-04 DIAGNOSIS — C649 Malignant neoplasm of unspecified kidney, except renal pelvis: Secondary | ICD-10-CM

## 2013-12-04 MED ORDER — PAZOPANIB HCL 200 MG PO TABS: 400.0000 mg | ORAL_TABLET | Freq: Every day | ORAL | Status: DC

## 2013-12-04 NOTE — Telephone Encounter (Signed)
THIS REFILL REQUEST FOR VOTRIENT WAS PLACED IN DR.SHADAD'S ACTIVE WORK FOLDER. 

## 2013-12-22 ENCOUNTER — Telehealth: Payer: Self-pay | Admitting: Internal Medicine

## 2013-12-22 MED ORDER — PANTOPRAZOLE SODIUM 40 MG PO TBEC: 40.0000 mg | DELAYED_RELEASE_TABLET | Freq: Every day | ORAL | Status: DC

## 2013-12-22 NOTE — Telephone Encounter (Signed)
Routing to the refill box. 

## 2013-12-22 NOTE — Addendum Note (Signed)
Addended by: Orvil Feil on: 12/22/2013 01:45 PM   Modules accepted: Orders

## 2013-12-22 NOTE — Telephone Encounter (Signed)
Patient is needing a refill on his pantoprazole and he uses [facility]

## 2013-12-25 ENCOUNTER — Encounter: Payer: Self-pay | Admitting: Oncology

## 2013-12-25 ENCOUNTER — Encounter: Payer: Self-pay | Admitting: *Deleted

## 2013-12-25 NOTE — Progress Notes (Signed)
Patient has been approved for asst with Votrient==[facility]. Will send copy of letter to medical records.

## 2013-12-29 ENCOUNTER — Encounter: Payer: Self-pay | Admitting: Oncology

## 2013-12-29 NOTE — Progress Notes (Signed)
Ebony gave [employer] for approval for Votrient. Copay 25.00.  I called the wife back to advise all is done and she needs to call to confirm shipping.etc.

## 2014-01-11 ENCOUNTER — Ambulatory Visit (HOSPITAL_BASED_OUTPATIENT_CLINIC_OR_DEPARTMENT_OTHER): Payer: PRIVATE HEALTH INSURANCE | Admitting: Physician Assistant

## 2014-01-11 ENCOUNTER — Encounter: Payer: Self-pay | Admitting: Physician Assistant

## 2014-01-11 ENCOUNTER — Telehealth: Payer: Self-pay | Admitting: Oncology

## 2014-01-11 ENCOUNTER — Other Ambulatory Visit (HOSPITAL_BASED_OUTPATIENT_CLINIC_OR_DEPARTMENT_OTHER): Payer: PRIVATE HEALTH INSURANCE

## 2014-01-11 VITALS — BP 146/71 | HR 66 | Temp 98.0°F | Resp 18 | Ht 71.0 in | Wt 176.4 lb

## 2014-01-11 DIAGNOSIS — C649 Malignant neoplasm of unspecified kidney, except renal pelvis: Secondary | ICD-10-CM

## 2014-01-11 DIAGNOSIS — D649 Anemia, unspecified: Secondary | ICD-10-CM

## 2014-01-11 DIAGNOSIS — D696 Thrombocytopenia, unspecified: Secondary | ICD-10-CM

## 2014-01-11 DIAGNOSIS — C7889 Secondary malignant neoplasm of other digestive organs: Secondary | ICD-10-CM

## 2014-01-11 LAB — COMPREHENSIVE METABOLIC PANEL (CC13)
ALBUMIN: 3.6 g/dL (ref 3.5–5.0)
ALT: 20 U/L (ref 0–55)
AST: 45 U/L — AB (ref 5–34)
Alkaline Phosphatase: 84 U/L (ref 40–150)
Anion Gap: 9 mEq/L (ref 3–11)
BUN: 16.1 mg/dL (ref 7.0–26.0)
CALCIUM: 9.6 mg/dL (ref 8.4–10.4)
CHLORIDE: 110 meq/L — AB (ref 98–109)
CO2: 27 mEq/L (ref 22–29)
Creatinine: 1.5 mg/dL — ABNORMAL HIGH (ref 0.7–1.3)
GLUCOSE: 87 mg/dL (ref 70–140)
Potassium: 4.1 mEq/L (ref 3.5–5.1)
Sodium: 146 mEq/L — ABNORMAL HIGH (ref 136–145)
Total Bilirubin: 1.03 mg/dL (ref 0.20–1.20)
Total Protein: 7.1 g/dL (ref 6.4–8.3)

## 2014-01-11 LAB — CBC WITH DIFFERENTIAL/PLATELET
BASO%: 0.6 % (ref 0.0–2.0)
BASOS ABS: 0 10*3/uL (ref 0.0–0.1)
EOS ABS: 0 10*3/uL (ref 0.0–0.5)
EOS%: 1.2 % (ref 0.0–7.0)
HCT: 32.1 % — ABNORMAL LOW (ref 38.4–49.9)
HEMOGLOBIN: 10.8 g/dL — AB (ref 13.0–17.1)
LYMPH#: 0.3 10*3/uL — AB (ref 0.9–3.3)
LYMPH%: 9.4 % — ABNORMAL LOW (ref 14.0–49.0)
MCH: 38.8 pg — ABNORMAL HIGH (ref 27.2–33.4)
MCHC: 33.6 g/dL (ref 32.0–36.0)
MCV: 115.4 fL — ABNORMAL HIGH (ref 79.3–98.0)
MONO#: 0.4 10*3/uL (ref 0.1–0.9)
MONO%: 12 % (ref 0.0–14.0)
NEUT%: 76.8 % — ABNORMAL HIGH (ref 39.0–75.0)
NEUTROS ABS: 2.3 10*3/uL (ref 1.5–6.5)
Platelets: 86 10*3/uL — ABNORMAL LOW (ref 140–400)
RBC: 2.78 10*6/uL — ABNORMAL LOW (ref 4.20–5.82)
RDW: 15.7 % — AB (ref 11.0–14.6)
WBC: 3 10*3/uL — AB (ref 4.0–10.3)

## 2014-01-11 NOTE — Patient Instructions (Signed)
Continue Ecotrin at the current dose Followup in approximately one month

## 2014-01-11 NOTE — Progress Notes (Signed)
Hematology and Oncology Follow Up Visit  Jeffrey Trujillo [id number] 07/21/1951 63 y.o.    Principle Diagnosis: 63 year old with the following issues:  1. T1b right kidney cancer diagnosed in 2010. He is rdical nephrectomy on June 27, 2009. The pathological staging was T1b, was clear cell histology with Fuhrman grade 3/4.  2. Left Kidney mass  Measuring 4.5 x 2 7 x 3.6 irregular enhancing appears to be a new kidney cancer noted in 07/2012. 3. Pancreatic lesion. 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  Current therapy: Votrient  800 mg daily started in December 2013. Treatment held in 11/2012 due to increase LFTs. Votrient was resumed in April 2014 at 400 mg daily.  Interim History: Mr. Sweeden presents for a follow up visit, accompanied by his wife. Since his last visit, he have been doing  well. He reports decreased appetite, stating that food just doesn't taste right. He reports some occasional episodes of diarrhea that her self limiting. He also reports some occasional chills/feeling cold not associated with any fever. He notes some swelling in his feet more so on the right than on the left. He attributes this to walking on concrete floors during his work week. He states the swelling resolves totally over the weekend when he is not working and walking on concrete floors. He is not reporting any new issues.  He is doing well otherwise with the Votrient. Denies chest pain, shortness of breath, abdominal pain, nausea, vomiting.  No rashes. He has not noticed any bleeding. He reports no new complications from his Votrient.  He continues to work full-time without any delay or attendance. He has not reported any constitutional symptom. His weight remains relatively stable.  Medications:  Current Outpatient Prescriptions  Medication Sig Dispense Refill  . pantoprazole (PROTONIX) 40 MG tablet Take 1 tablet (40 mg total) by mouth daily.  30 tablet  5  . pazopanib (VOTRIENT) 200 MG  tablet Take 2 tablets (400 mg total) by mouth daily. Take on an empty stomach.  60 tablet  1  . topiramate (TOPAMAX) 25 MG tablet Take 25 mg by mouth 2 (two) times daily.       No current facility-administered medications for this visit.     Allergies:  Allergies  Allergen Reactions  . Nsaids Other (See Comments)    Patient only has one kidney.   . Meperidine Hcl Hives and Rash    Past Medical History, Surgical history, Social history, and Family History were reviewed and updated.  Review of Systems:  Remaining ROS negative.  Physical Exam: Blood pressure 146/71, pulse 66, temperature 98 F (44.45 C), temperature source Oral, resp. rate 18, height 5\' 11"  (1.803 m), weight 176 lb 4 oz (80.01 kg). ECOG: 1 General appearance: alert and awake appeared in no active distress. Head: Normocephalic, without obvious abnormality, atraumatic Neck: no adenopathy, no carotid bruit, no JVD, supple, symmetrical, trachea midline and thyroid not enlarged, symmetric, no tenderness/mass/nodules Lymph nodes: Cervical, supraclavicular, and axillary nodes normal. Heart:regular rate and rhythm, S1, S2 normal, no murmur, click, rub or gallop Lung:chest clear, no wheezing, rales, normal symmetric air entry Abdomen: soft, non-tender, without masses or organomegaly EXT:no erythema, induration, or nodules. No erythema. Good pluses, no discoloration or rash noted.     CBC    Component Value Date/Time   WBC 3.0* 01/11/2014 1431   WBC 1.5* 06/09/2013 1935   RBC 2.78* 01/11/2014 1431   RBC 2.96* 06/09/2013 1935   HGB 10.8* 01/11/2014  1431   HGB 11.5* 06/09/2013 1935   HCT 32.1* 01/11/2014 1431   HCT 32.8* 06/09/2013 1935   PLT 86* 01/11/2014 1431   PLT PLATELET CLUMPS NOTED ON SMEAR, COUNT APPEARS DECREASED 06/09/2013 1935   MCV 115.4* 01/11/2014 1431   MCV 110.8* 06/09/2013 1935   MCH 38.8* 01/11/2014 1431   MCH 38.9* 06/09/2013 1935   MCHC 33.6 01/11/2014 1431   MCHC 35.1 06/09/2013 1935   RDW 15.7* 01/11/2014  1431   RDW 18.0* 06/09/2013 1935   LYMPHSABS 0.3* 01/11/2014 1431   LYMPHSABS 0.3* 06/09/2013 1935   MONOABS 0.4 01/11/2014 1431   MONOABS 0.2 06/09/2013 1935   EOSABS 0.0 01/11/2014 1431   EOSABS 0.0 06/09/2013 1935   BASOSABS 0.0 01/11/2014 1431   BASOSABS 0.0 06/09/2013 1935    EXAM  CT CHEST WITH CONTRAST  CT ABDOMEN AND PELVIS WITH AND WITHOUT CONTRAST  TECHNIQUE  Multidetector CT imaging of the chest was performed during  intravenous contrast administration. Multidetector CT imaging of the  abdomen and pelvis was performed following the standard protocol  before and during bolus administration of intravenous contrast.  CONTRAST  17mL OMNIPAQUE IOHEXOL 300 MG/ML SOLN  COMPARISON  CT CHEST W/CM dated 07/24/2013; CT ABD/PELVIS W CM dated 07/24/2013;  CT ABD - PELV W/ CM dated 03/10/2013; MR ABDOMEN WO/W CM dated  07/18/2012  FINDINGS  CT CHEST FINDINGS  Lungs/Pleura: Volume loss in the left lower lobe. Minimal thickening  along the left major fissure superiorly. This is unchanged on image  35. No pleural fluid. Minimal similar left pleural thickening.  Heart/Mediastinum: No supraclavicular adenopathy. Tiny low-density  thyroid nodules which are nonspecific. Heart size upper normal,  without pericardial effusion. No central pulmonary embolism, on this  non-dedicated study. No mediastinal or hilar adenopathy.  CT ABDOMEN AND PELVIS FINDINGS  Abdomen/Pelvis: No renal calculi or abdominal ureteric calculi on  unenhanced imaging. No hypervascular lesions within the liver,  pancreas on arterial phase images.  Portal venous phase images demonstrate mild hepatic steatosis. A too  small to characterize left hepatic lobe lesion at 6 mm on image  86/series 6. This has been present back to 03/10/2013, and was  consistent with a cyst on remote prior MRI.  A splenule. Normal stomach, pancreas, biliary tract, adrenal glands.  Suspicion of cholelithiasis. Status post right nephrectomy, without   locally recurrent disease.  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 An upper pole left renal cyst.  Lateral upper pole left renal mass measures 2.4 x 2.4 cm on image  120 of series 6. Similar to minimally enlarged from the prior exam,  where it was measured at 2.2 x 2.3 cm. No new renal lesions.  No retroperitoneal or retrocrural adenopathy. Extensive colonic  diverticulosis. Normal small bowel without abdominal ascites.  No pelvic adenopathy. Normal urinary bladder and prostate. Tiny  urachal remnant incidentally noted. No significant free fluid.  Bones/Musculoskeletal: Left-sided rib defect which is likely  postsurgical.  IMPRESSION  1. Similar to minimal increase in size of a solid upper pole left  renal lesion.  2. No evidence of abdominal nodal or distant metastasis.  3. Right nephrectomy, without locally recurrent disease.  4. Hepatic steatosis.  5. No acute process or evidence of metastatic disease in the chest.  6. Suspicion of cholelithiasis.     Impression and Plan:   A 63 year old gentleman with the following issues:   1. A history of renal cell carcinoma. He had stage T1b  diagnosed in October 2010 of the right kidney, status post right nephrectomy, now presents with a solid tumor of the left kidney as well as pancreatic metastasis.CT scan from 11/25/2013 reviewed with the patient today and showed stable disease in the kidney and the pancreas . He is now on Votrient at total of 400 mg (50% dose reduction) and doing well. The plan is to continue with the current dose and schedule and repeat CT scan in 3 months (July 2015).  2. Pancreatic lesion. Biopsy proven to be malignant and likely represent renal cell cancer. Regressed on Votrient.   3. Thrombocytopenia: This is related to Votrient has improved we'll continue to monitor closely. He has no signs or symptoms of bleeding.  4. Anemia. Hgb is stable. No active bleeding. No transfusion is  indicated. We will consider growth factor support if he becomes symptomatic  5. Increase LFTs: back to normal now. Will continue to follow this.   6. Follow-up. In about 4-5 weeks.   Carlton Adam PA-C 01/11/2014

## 2014-01-11 NOTE — Telephone Encounter (Signed)
gv pt appt schedule for may.  °

## 2014-02-09 ENCOUNTER — Telehealth: Payer: Self-pay | Admitting: Oncology

## 2014-02-09 NOTE — Telephone Encounter (Signed)
pt came in and needed to r/s appt..done...pt aware of new d.t

## 2014-02-10 ENCOUNTER — Other Ambulatory Visit: Payer: PRIVATE HEALTH INSURANCE

## 2014-02-10 ENCOUNTER — Ambulatory Visit: Payer: PRIVATE HEALTH INSURANCE | Admitting: Oncology

## 2014-02-11 ENCOUNTER — Telehealth: Payer: Self-pay | Admitting: Oncology

## 2014-02-11 NOTE — Telephone Encounter (Signed)
returned pt call adn r/s appt per pt request...done...pt aware of new d.t

## 2014-02-16 ENCOUNTER — Ambulatory Visit: Payer: PRIVATE HEALTH INSURANCE | Admitting: Oncology

## 2014-02-16 ENCOUNTER — Other Ambulatory Visit: Payer: PRIVATE HEALTH INSURANCE

## 2014-02-17 ENCOUNTER — Other Ambulatory Visit (HOSPITAL_BASED_OUTPATIENT_CLINIC_OR_DEPARTMENT_OTHER): Payer: PRIVATE HEALTH INSURANCE

## 2014-02-17 ENCOUNTER — Telehealth: Payer: Self-pay | Admitting: Oncology

## 2014-02-17 ENCOUNTER — Encounter: Payer: Self-pay | Admitting: Oncology

## 2014-02-17 ENCOUNTER — Ambulatory Visit (HOSPITAL_BASED_OUTPATIENT_CLINIC_OR_DEPARTMENT_OTHER): Payer: PRIVATE HEALTH INSURANCE | Admitting: Oncology

## 2014-02-17 VITALS — BP 144/67 | HR 61 | Temp 97.7°F | Resp 20 | Ht 71.0 in | Wt 169.1 lb

## 2014-02-17 DIAGNOSIS — R634 Abnormal weight loss: Secondary | ICD-10-CM

## 2014-02-17 DIAGNOSIS — C649 Malignant neoplasm of unspecified kidney, except renal pelvis: Secondary | ICD-10-CM

## 2014-02-17 DIAGNOSIS — D696 Thrombocytopenia, unspecified: Secondary | ICD-10-CM

## 2014-02-17 DIAGNOSIS — D649 Anemia, unspecified: Secondary | ICD-10-CM

## 2014-02-17 DIAGNOSIS — C7889 Secondary malignant neoplasm of other digestive organs: Secondary | ICD-10-CM

## 2014-02-17 LAB — CBC WITH DIFFERENTIAL/PLATELET
BASO%: 0.7 % (ref 0.0–2.0)
Basophils Absolute: 0 10*3/uL (ref 0.0–0.1)
EOS%: 1.7 % (ref 0.0–7.0)
Eosinophils Absolute: 0.1 10*3/uL (ref 0.0–0.5)
HEMATOCRIT: 33.8 % — AB (ref 38.4–49.9)
HGB: 11.3 g/dL — ABNORMAL LOW (ref 13.0–17.1)
LYMPH#: 0.5 10*3/uL — AB (ref 0.9–3.3)
LYMPH%: 15 % (ref 14.0–49.0)
MCH: 39 pg — AB (ref 27.2–33.4)
MCHC: 33.5 g/dL (ref 32.0–36.0)
MCV: 116.5 fL — ABNORMAL HIGH (ref 79.3–98.0)
MONO#: 0.3 10*3/uL (ref 0.1–0.9)
MONO%: 10.4 % (ref 0.0–14.0)
NEUT#: 2.3 10*3/uL (ref 1.5–6.5)
NEUT%: 72.2 % (ref 39.0–75.0)
Platelets: 78 10*3/uL — ABNORMAL LOW (ref 140–400)
RBC: 2.9 10*6/uL — ABNORMAL LOW (ref 4.20–5.82)
RDW: 15.8 % — AB (ref 11.0–14.6)
WBC: 3.2 10*3/uL — AB (ref 4.0–10.3)

## 2014-02-17 LAB — COMPREHENSIVE METABOLIC PANEL (CC13)
ALT: 36 U/L (ref 0–55)
ANION GAP: 16 meq/L — AB (ref 3–11)
AST: 69 U/L — ABNORMAL HIGH (ref 5–34)
Albumin: 3.5 g/dL (ref 3.5–5.0)
Alkaline Phosphatase: 124 U/L (ref 40–150)
BUN: 10.4 mg/dL (ref 7.0–26.0)
CALCIUM: 8.7 mg/dL (ref 8.4–10.4)
CHLORIDE: 105 meq/L (ref 98–109)
CO2: 23 meq/L (ref 22–29)
CREATININE: 1.2 mg/dL (ref 0.7–1.3)
Glucose: 81 mg/dl (ref 70–140)
Potassium: 4.1 mEq/L (ref 3.5–5.1)
Sodium: 143 mEq/L (ref 136–145)
TOTAL PROTEIN: 7.2 g/dL (ref 6.4–8.3)
Total Bilirubin: 1.09 mg/dL (ref 0.20–1.20)

## 2014-02-17 NOTE — Progress Notes (Signed)
Hematology and Oncology Follow Up Visit  Jeffrey Trujillo [id number] [birth year] 63 y.o.    Principle Diagnosis: 63 year old with the following issues:  1. T1b right kidney cancer diagnosed in 2010. He is S/P rdical nephrectomy on June 27, 2009. The pathological staging was T1b clear cell histology with Fuhrman grade 3/4.  2. Left Kidney mass  Measuring 4.5 x 2 7 x 3.6 irregular enhancing appears to be a new kidney cancer noted in 07/2012. 3. Pancreatic lesion. 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  Current therapy: Votrient  800 mg daily started in December 2013. Treatment held in 11/2012 Trujillo to increase LFTs. Votrient was resumed in April 2014 at 400 mg daily.  Interim History: Jeffrey Trujillo presents for a follow up visit, accompanied by his wife. Since his last visit, he reports no new symptoms. He reports decreased appetite, and have lost a few pounds. He reports he feels hungry at times but does not pursue eating as often as he should. He does not report any nausea or vomiting. Does not report any dysphasia or difficulty with swallowing. He is not reporting any new issues.  He is doing well otherwise with the Votrient. He denies chest pain, shortness of breath, abdominal pain, nausea, vomiting.  No rashes. He has not noticed any bleeding. He reports no new complications from his Votrient.  He continues to work full-time without any difficulty at this time. He has not reported any constitutional symptom. He did not report any headaches blurred vision double vision. Does not report any motor sensory neuropathy or alteration of mental status. He does not report any depression or anxiety. He does not report any frequency urgency or hesitancy. He does not report any other urinary symptoms. He does not report any hematochezia or melena. He is not reporting any skin rashes or petechiae. Does not report any lymphadenopathy or any constitutional symptoms. Rest of his review of systems is  unremarkable.  Medications: Unchanged by my review today. Current Outpatient Prescriptions  Medication Sig Dispense Refill  . pantoprazole (PROTONIX) 40 MG tablet Take 1 tablet (40 mg total) by mouth daily.  30 tablet  5  . pazopanib (VOTRIENT) 200 MG tablet Take 2 tablets (400 mg total) by mouth daily. Take on an empty stomach.  60 tablet  1  . topiramate (TOPAMAX) 25 MG tablet Take 25 mg by mouth 2 (two) times daily.       No current facility-administered medications for this visit.     Allergies:  Allergies  Allergen Reactions  . Nsaids Other (See Comments)    Patient only has one kidney.   . Meperidine Hcl Hives and Rash    Past Medical History, Surgical history, Social history, and Family History were reviewed and updated.   Physical Exam: Blood pressure 146/71, pulse 66, temperature 98 F (44.45 C), temperature source Oral, resp. rate 18, height 5\' 11"  (1.803 m), weight 176 lb 4 oz (80.01 kg). ECOG: 1 General appearance: alert and awake appeared in no active distress. Head: Normocephalic, without obvious abnormality.  Neck: no adenopathy, no masses Lymph nodes: Cervical, supraclavicular, and axillary nodes normal. Heart:regular rate and rhythm, S1, S2.  Lung:chest clear, no wheezing, rales, normal symmetric air entry no decreased breath sounds appear Abdomen: soft, non-tender, without masses or organomegaly no shifting dullness or ascites EXT:no erythema, induration, or nodules. No erythema.  Neurological exam: No deficits noted.   CBC    Component Value Date/Time   WBC 3.2* 02/17/2014  1244   WBC 1.5* 06/09/2013 1935   RBC 2.90* 02/17/2014 1244   RBC 2.96* 06/09/2013 1935   HGB 11.3* 02/17/2014 1244   HGB 11.5* 06/09/2013 1935   HCT 33.8* 02/17/2014 1244   HCT 32.8* 06/09/2013 1935   PLT 78* 02/17/2014 1244   PLT PLATELET CLUMPS NOTED ON SMEAR, COUNT APPEARS DECREASED 06/09/2013 1935   MCV 116.5* 02/17/2014 1244   MCV 110.8* 06/09/2013 1935   MCH 39.0* 02/17/2014 1244   MCH  38.9* 06/09/2013 1935   MCHC 33.5 02/17/2014 1244   MCHC 35.1 06/09/2013 1935   RDW 15.8* 02/17/2014 1244   RDW 18.0* 06/09/2013 1935   LYMPHSABS 0.5* 02/17/2014 1244   LYMPHSABS 0.3* 06/09/2013 1935   MONOABS 0.3 02/17/2014 1244   MONOABS 0.2 06/09/2013 1935   EOSABS 0.1 02/17/2014 1244   EOSABS 0.0 06/09/2013 1935   BASOSABS 0.0 02/17/2014 1244   BASOSABS 0.0 06/09/2013 1935    Impression and Plan:   A 63 year old gentleman with the following issues:   1. A history of renal cell carcinoma. He had stage T1b diagnosed in October 2010 of the right kidney, status post right nephrectomy. He also has left kidney cancer and pancreatic metastasis. He is currently on Votrient without any new complications. CT scan from 11/25/2013  showed stable disease in the kidney and the pancreas . He is now on 400 mg (50% dose reduction) and doing well. The plan is to continue with the current dose and schedule and repeat CT scan in July of 2015 before the next visit.  2. Pancreatic lesion. Biopsy proven to be malignant and likely represent renal cell cancer. Regressed on Votrient.   3. Thrombocytopenia: This is related to Votrient slightly down at this time despite the dose reduction. No active bleeding I will continue to monitor closely to  4. Anemia. Hgb is stable. No active bleeding. No transfusion is indicated.   5. Increase LFTs: back to normal now. Will continue to follow this.   6. weight loss: Continue to encourage him to increase the frequency of his meals. I've also instructed him to use Boost or Ensure to supplement his main meals.  7. Follow-up. In about 4-5 weeks.   Jeffrey Portela MD 02/17/2014

## 2014-02-17 NOTE — Telephone Encounter (Signed)
[facility] dn printed appt sched and avs for pt for July....sed gv barium

## 2014-03-02 ENCOUNTER — Telehealth: Payer: Self-pay | Admitting: Medical Oncology

## 2014-03-02 ENCOUNTER — Encounter: Payer: Self-pay | Admitting: Oncology

## 2014-03-02 NOTE — Progress Notes (Signed)
[facility] @ [phone] about votrient pa; approved from 03/02/14-09/01/14

## 2014-03-02 NOTE — Telephone Encounter (Signed)
Spouse called to inform office patient has not had his Votrient since the 11th, states she called [employer] and they state that they need a prior preauth from our office. Call to [facility] to inquire about prescription, they also state they have not received a preauth.   Per office note on 12/25/2013 by Z. Browing, managed care, preauth was sent.  This mssg give to managed care, Z. Browing for f/u.

## 2014-03-04 ENCOUNTER — Other Ambulatory Visit: Payer: Self-pay | Admitting: Medical Oncology

## 2014-03-04 ENCOUNTER — Other Ambulatory Visit: Payer: Self-pay | Admitting: *Deleted

## 2014-03-04 DIAGNOSIS — C649 Malignant neoplasm of unspecified kidney, except renal pelvis: Secondary | ICD-10-CM

## 2014-03-04 MED ORDER — PAZOPANIB HCL 200 MG PO TABS: 400.0000 mg | ORAL_TABLET | Freq: Every day | ORAL | Status: DC

## 2014-03-04 NOTE — Telephone Encounter (Signed)
THIS REFILL REQUEST FOR VOTRIENT WAS PLACED IN DR.SHADAD'S ACTIVE WORK FOLDER. 

## 2014-03-04 NOTE — Telephone Encounter (Signed)
Prescription refill for Votrient (400mg  total) faxed to [facility] @ [phone]

## 2014-03-23 ENCOUNTER — Ambulatory Visit (HOSPITAL_COMMUNITY)
Admission: RE | Admit: 2014-03-23 | Discharge: 2014-03-23 | Disposition: A | Discharge status: Home / Self Care | Payer: PRIVATE HEALTH INSURANCE | Source: Ambulatory Visit | Attending: Oncology | Admitting: Oncology

## 2014-03-23 ENCOUNTER — Other Ambulatory Visit: Payer: PRIVATE HEALTH INSURANCE

## 2014-03-23 ENCOUNTER — Other Ambulatory Visit (HOSPITAL_BASED_OUTPATIENT_CLINIC_OR_DEPARTMENT_OTHER): Payer: PRIVATE HEALTH INSURANCE

## 2014-03-23 ENCOUNTER — Other Ambulatory Visit: Payer: Self-pay | Admitting: Oncology

## 2014-03-23 DIAGNOSIS — Z9221 Personal history of antineoplastic chemotherapy: Secondary | ICD-10-CM | POA: Insufficient documentation

## 2014-03-23 DIAGNOSIS — C649 Malignant neoplasm of unspecified kidney, except renal pelvis: Secondary | ICD-10-CM

## 2014-03-23 DIAGNOSIS — K7689 Other specified diseases of liver: Secondary | ICD-10-CM | POA: Insufficient documentation

## 2014-03-23 DIAGNOSIS — N281 Cyst of kidney, acquired: Secondary | ICD-10-CM | POA: Insufficient documentation

## 2014-03-23 DIAGNOSIS — J984 Other disorders of lung: Secondary | ICD-10-CM | POA: Insufficient documentation

## 2014-03-23 DIAGNOSIS — C7889 Secondary malignant neoplasm of other digestive organs: Secondary | ICD-10-CM

## 2014-03-23 DIAGNOSIS — Z905 Acquired absence of kidney: Secondary | ICD-10-CM | POA: Insufficient documentation

## 2014-03-23 DIAGNOSIS — D649 Anemia, unspecified: Secondary | ICD-10-CM

## 2014-03-23 DIAGNOSIS — I7090 Unspecified atherosclerosis: Secondary | ICD-10-CM | POA: Insufficient documentation

## 2014-03-23 DIAGNOSIS — K449 Diaphragmatic hernia without obstruction or gangrene: Secondary | ICD-10-CM | POA: Insufficient documentation

## 2014-03-23 LAB — COMPREHENSIVE METABOLIC PANEL (CC13)
ALT: 28 U/L (ref 0–55)
AST: 47 U/L — AB (ref 5–34)
Albumin: 3.7 g/dL (ref 3.5–5.0)
Alkaline Phosphatase: 95 U/L (ref 40–150)
Anion Gap: 12 mEq/L — ABNORMAL HIGH (ref 3–11)
BILIRUBIN TOTAL: 1.07 mg/dL (ref 0.20–1.20)
BUN: 7.9 mg/dL (ref 7.0–26.0)
CO2: 26 mEq/L (ref 22–29)
Calcium: 9.3 mg/dL (ref 8.4–10.4)
Chloride: 104 mEq/L (ref 98–109)
Creatinine: 1.2 mg/dL (ref 0.7–1.3)
GLUCOSE: 84 mg/dL (ref 70–140)
POTASSIUM: 4.1 meq/L (ref 3.5–5.1)
Sodium: 141 mEq/L (ref 136–145)
TOTAL PROTEIN: 7.3 g/dL (ref 6.4–8.3)

## 2014-03-23 LAB — CBC WITH DIFFERENTIAL/PLATELET
BASO%: 0.5 % (ref 0.0–2.0)
BASOS ABS: 0 10*3/uL (ref 0.0–0.1)
EOS%: 0.9 % (ref 0.0–7.0)
Eosinophils Absolute: 0 10*3/uL (ref 0.0–0.5)
HCT: 33.6 % — ABNORMAL LOW (ref 38.4–49.9)
HGB: 11.4 g/dL — ABNORMAL LOW (ref 13.0–17.1)
LYMPH#: 0.4 10*3/uL — AB (ref 0.9–3.3)
LYMPH%: 19.2 % (ref 14.0–49.0)
MCH: 38.6 pg — AB (ref 27.2–33.4)
MCHC: 33.9 g/dL (ref 32.0–36.0)
MCV: 113.9 fL — AB (ref 79.3–98.0)
MONO#: 0.3 10*3/uL (ref 0.1–0.9)
MONO%: 11.9 % (ref 0.0–14.0)
NEUT#: 1.5 10*3/uL (ref 1.5–6.5)
NEUT%: 67.5 % (ref 39.0–75.0)
NRBC: 0 % (ref 0–0)
Platelets: 95 10*3/uL — ABNORMAL LOW (ref 140–400)
RBC: 2.95 10*6/uL — AB (ref 4.20–5.82)
RDW: 14.1 % (ref 11.0–14.6)
WBC: 2.2 10*3/uL — ABNORMAL LOW (ref 4.0–10.3)

## 2014-03-23 MED ORDER — IOHEXOL 300 MG/ML  SOLN
100.0000 mL | Freq: Once | INTRAMUSCULAR | Status: AC | PRN
  Administered 2014-03-23: 100 mL via INTRAVENOUS

## 2014-03-25 ENCOUNTER — Encounter: Payer: Self-pay | Admitting: Oncology

## 2014-03-25 ENCOUNTER — Telehealth: Payer: Self-pay | Admitting: Oncology

## 2014-03-25 ENCOUNTER — Ambulatory Visit (HOSPITAL_BASED_OUTPATIENT_CLINIC_OR_DEPARTMENT_OTHER): Payer: PRIVATE HEALTH INSURANCE | Admitting: Oncology

## 2014-03-25 VITALS — BP 132/84 | HR 86 | Temp 98.0°F | Resp 18 | Ht 71.0 in | Wt 169.0 lb

## 2014-03-25 DIAGNOSIS — C649 Malignant neoplasm of unspecified kidney, except renal pelvis: Secondary | ICD-10-CM

## 2014-03-25 DIAGNOSIS — R945 Abnormal results of liver function studies: Secondary | ICD-10-CM

## 2014-03-25 DIAGNOSIS — D649 Anemia, unspecified: Secondary | ICD-10-CM

## 2014-03-25 DIAGNOSIS — C7889 Secondary malignant neoplasm of other digestive organs: Secondary | ICD-10-CM

## 2014-03-25 DIAGNOSIS — D696 Thrombocytopenia, unspecified: Secondary | ICD-10-CM

## 2014-03-25 DIAGNOSIS — R634 Abnormal weight loss: Secondary | ICD-10-CM

## 2014-03-25 NOTE — Telephone Encounter (Signed)
gv and printed appt sched and avs for pt fro Aug °

## 2014-03-25 NOTE — Progress Notes (Signed)
Hematology and Oncology Follow Up Visit  Jeffrey Trujillo [id number] 19-Sep-1950 63 y.o.    Principle Diagnosis: 63 year old with the following issues:  1. T1b right kidney cancer diagnosed in 2010. He is S/P rdical nephrectomy on June 27, 2009. The pathological staging was T1b clear cell histology with Fuhrman grade 3/4.  2. Left Kidney mass  Measuring 4.5 x 2 7 x 3.6 irregular enhancing appears to be a new kidney cancer noted in 07/2012. 3. Pancreatic lesion. 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  Current therapy: Votrient  800 mg daily started in December 2013. Treatment held in 11/2012 due to increase LFTs. Votrient was resumed in April 2014 at 400 mg daily.  Interim History: Mr. Sagona presents for a follow up visit with his wife. Since his last visit, he reports no new symptoms. He continues to tolerate this medication without any new symptoms. He does not report any nausea or vomiting. Does not report any dysphasia or difficulty with swallowing. He is not reporting any new issues.  He is doing well otherwise with the Votrient. He denies chest pain, shortness of breath, abdominal pain,  No rashes but continues to have easy bruising.  He continues to work full-time without any difficulty at this time. He has not reported any constitutional symptom. He did not report any headaches blurred vision double vision. Does not report any motor sensory neuropathy or alteration of mental status. He does not report any depression or anxiety. He does not report any frequency urgency or hesitancy. He does not report any other urinary symptoms. He does not report any hematochezia or melena.  Does not report any lymphadenopathy or any constitutional symptoms. Rest of his review of systems is unremarkable.  Medications: Unchanged by my review today. Current Outpatient Prescriptions  Medication Sig Dispense Refill  . pantoprazole (PROTONIX) 40 MG tablet Take 40 mg by mouth daily.      .  pazopanib (VOTRIENT) 200 MG tablet Take 800 mg by mouth daily. Take on an empty stomach.      . topiramate (TOPAMAX) 25 MG tablet Take 25 mg by mouth 2 (two) times daily.       No current facility-administered medications for this visit.     Allergies:  Allergies  Allergen Reactions  . Nsaids Other (See Comments)    Patient only has one kidney.   . Meperidine Hcl Hives and Rash    Past Medical History, Surgical history, Social history, and Family History were reviewed and updated.   Physical Exam: Blood pressure 146/71, pulse 66, temperature 98 F (44.45 C), temperature source Oral, resp. rate 18, height 5\' 11"  (1.803 m), weight 176 lb 4 oz (80.01 kg). ECOG: 1 General appearance: alert and awake appeared in no active distress. Head: Normocephalic, without obvious abnormality.  Neck: no adenopathy, no masses Lymph nodes: Cervical, supraclavicular, and axillary nodes normal. Heart:regular rate and rhythm, S1, S2.  Lung:chest clear, no wheezing, or dullness to percussion. Abdomen: soft, non-tender, without masses or organomegaly no shifting dullness or ascites EXT:no erythema, induration, or nodules. No edema. Neurological exam: No deficits noted.   CBC    Component Value Date/Time   WBC 2.2* 03/23/2014 1241   WBC 1.5* 06/09/2013 1935   RBC 2.95* 03/23/2014 1241   RBC 2.96* 06/09/2013 1935   HGB 11.4* 03/23/2014 1241   HGB 11.5* 06/09/2013 1935   HCT 33.6* 03/23/2014 1241   HCT 32.8* 06/09/2013 1935   PLT 95* 03/23/2014 1241  PLT PLATELET CLUMPS NOTED ON SMEAR, COUNT APPEARS DECREASED 06/09/2013 1935   MCV 113.9* 03/23/2014 1241   MCV 110.8* 06/09/2013 1935   MCH 38.6* 03/23/2014 1241   MCH 38.9* 06/09/2013 1935   MCHC 33.9 03/23/2014 1241   MCHC 35.1 06/09/2013 1935   RDW 14.1 03/23/2014 1241   RDW 18.0* 06/09/2013 1935   LYMPHSABS 0.4* 03/23/2014 1241   LYMPHSABS 0.3* 06/09/2013 1935   MONOABS 0.3 03/23/2014 1241   MONOABS 0.2 06/09/2013 1935   EOSABS 0.0 03/23/2014 1241   EOSABS 0.0  06/09/2013 1935   BASOSABS 0.0 03/23/2014 1241   BASOSABS 0.0 06/09/2013 1935    EXAM:  CT CHEST WITH CONTRAST  CT ABDOMEN AND PELVIS WITH AND WITHOUT CONTRAST  TECHNIQUE:  Multidetector CT imaging of the chest was performed during  intravenous contrast administration. Multidetector CT imaging of the  abdomen and pelvis was performed following the standard protocol  before and during bolus administration of intravenous contrast.  CONTRAST: 121mL OMNIPAQUE IOHEXOL 300 MG/ML SOLN  COMPARISON: Prior examinations 11/25/2013 and 07/24/2013.  FINDINGS:  CT CHEST FINDINGS  Small mediastinal lymph nodes are stable, not pathologically  enlarged. There is stable minimal heterogeneity of the thyroid  gland. Mild atherosclerosis and a small hiatal hernia are stable.  There is no significant pleural or pericardial effusion.  There is stable volume loss and scarring in the left lower lobe. No  suspicious pulmonary nodules or confluent airspace opacities are  present. There is no endobronchial lesion.  There are no worrisome osseous findings.  CT ABDOMEN AND PELVIS FINDINGS  There is progressive enlargement of the heterogeneous enhancing mass  involving the upper pole of the left kidney. This now measures 3.5 x  3.5 cm on image 104 of series 6. There is some inferior extension of  tumor into the left renal sinus fat, although no definite  involvement of the collecting system or left renal vein. 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  There is no retroperitoneal lymphadenopathy. There is no mass within  the right nephrectomy bed. Both adrenal glands remain normal in  appearance.  Hepatic steatosis and a small low-density lesion in the left hepatic  lobe are stable. There are no new or enlarging liver lesions. The  spleen and pancreas appear normal. There are possible small  gallstones within the  gallbladder lumen, best seen on the pre  contrast images.  The stomach, small bowel and colon appear stable with mild sigmoid  colon diverticular changes. The prostate gland and bladder appear  stable. There are no significant vascular findings or worrisome  osseous findings.  IMPRESSION:  1. Progressive enlargement of heterogeneously enhancing mass in the  upper pole of the left kidney consistent with renal cell carcinoma.  2. No evidence of metastatic disease status post right nephrectomy.  Both adrenal glands remain normal in appearance.  3. Hepatic steatosis with stable low-density left hepatic lesion.  4. Probable cholelithiasis as before.     Impression and Plan:   A 63 year old gentleman with the following issues:   1. A history of renal cell carcinoma. He had stage T1b diagnosed in October 2010 of the right kidney, status post right nephrectomy. He also has left kidney cancer and pancreatic metastasis. He is currently on Votrient without any new complications. CT scan from 03/23/2014  showed stable disease for the most part. There is a slightly large in the  kidney mass but compared to previous CT scans appear stable. He is now on 400 mg (50% dose reduction) and doing well. The plan is to continue with the current dose and schedule. We will repeat imaging studies in 4 months.  2. Pancreatic lesion. Biopsy proven to be malignant and likely represent renal cell cancer. Regressed on Votrient.   3. Thrombocytopenia: This is related to Votrient continues to be stable.  4. Anemia. Hgb is stable. No active bleeding. No transfusion is indicated.   5. Increase LFTs: back to normal now. Will continue to follow this.   6. weight loss: Stable at this time.  7. Follow-up. In about 4-5 weeks.   [name] MD 03/25/2014

## 2014-04-05 ENCOUNTER — Other Ambulatory Visit: Payer: Self-pay | Admitting: Oncology

## 2014-04-14 ENCOUNTER — Encounter (HOSPITAL_COMMUNITY): Payer: Self-pay | Admitting: Emergency Medicine

## 2014-04-14 ENCOUNTER — Observation Stay (HOSPITAL_COMMUNITY)
Admission: EM | Admit: 2014-04-14 | Discharge: 2014-04-15 | Disposition: A | Discharge status: Home / Self Care | Payer: PRIVATE HEALTH INSURANCE | Attending: Family Medicine | Admitting: Family Medicine

## 2014-04-14 ENCOUNTER — Emergency Department (HOSPITAL_COMMUNITY): Payer: PRIVATE HEALTH INSURANCE

## 2014-04-14 DIAGNOSIS — C649 Malignant neoplasm of unspecified kidney, except renal pelvis: Secondary | ICD-10-CM

## 2014-04-14 DIAGNOSIS — Z79899 Other long term (current) drug therapy: Secondary | ICD-10-CM | POA: Insufficient documentation

## 2014-04-14 DIAGNOSIS — Z859 Personal history of malignant neoplasm, unspecified: Secondary | ICD-10-CM | POA: Insufficient documentation

## 2014-04-14 DIAGNOSIS — K5730 Diverticulosis of large intestine without perforation or abscess without bleeding: Secondary | ICD-10-CM | POA: Insufficient documentation

## 2014-04-14 DIAGNOSIS — R0989 Other specified symptoms and signs involving the circulatory and respiratory systems: Secondary | ICD-10-CM | POA: Insufficient documentation

## 2014-04-14 DIAGNOSIS — R0789 Other chest pain: Secondary | ICD-10-CM

## 2014-04-14 DIAGNOSIS — R0609 Other forms of dyspnea: Secondary | ICD-10-CM | POA: Insufficient documentation

## 2014-04-14 DIAGNOSIS — M47812 Spondylosis without myelopathy or radiculopathy, cervical region: Secondary | ICD-10-CM | POA: Insufficient documentation

## 2014-04-14 DIAGNOSIS — D649 Anemia, unspecified: Secondary | ICD-10-CM | POA: Insufficient documentation

## 2014-04-14 DIAGNOSIS — K2270 Barrett's esophagus without dysplasia: Secondary | ICD-10-CM | POA: Insufficient documentation

## 2014-04-14 DIAGNOSIS — M109 Gout, unspecified: Secondary | ICD-10-CM | POA: Insufficient documentation

## 2014-04-14 DIAGNOSIS — R110 Nausea: Secondary | ICD-10-CM | POA: Insufficient documentation

## 2014-04-14 DIAGNOSIS — Z87891 Personal history of nicotine dependence: Secondary | ICD-10-CM | POA: Insufficient documentation

## 2014-04-14 DIAGNOSIS — R079 Chest pain, unspecified: Principal | ICD-10-CM | POA: Insufficient documentation

## 2014-04-14 LAB — BASIC METABOLIC PANEL
Anion gap: 12 (ref 5–15)
BUN: 16 mg/dL (ref 6–23)
CHLORIDE: 108 meq/L (ref 96–112)
CO2: 23 mEq/L (ref 19–32)
Calcium: 9.1 mg/dL (ref 8.4–10.5)
Creatinine, Ser: 1.24 mg/dL (ref 0.50–1.35)
GFR calc Af Amer: 70 mL/min — ABNORMAL LOW (ref >90)
GFR calc non Af Amer: 61 mL/min — ABNORMAL LOW (ref >90)
GLUCOSE: 98 mg/dL (ref 70–99)
POTASSIUM: 3.7 meq/L (ref 3.7–5.3)
Sodium: 143 mEq/L (ref 137–147)

## 2014-04-14 LAB — CBC
HEMATOCRIT: 31 % — AB (ref 39.0–52.0)
HEMOGLOBIN: 10.5 g/dL — AB (ref 13.0–17.0)
MCH: 37.5 pg — ABNORMAL HIGH (ref 26.0–34.0)
MCHC: 33.9 g/dL (ref 30.0–36.0)
MCV: 110.7 fL — ABNORMAL HIGH (ref 78.0–100.0)
Platelets: 126 10*3/uL — ABNORMAL LOW (ref 150–400)
RBC: 2.8 MIL/uL — AB (ref 4.22–5.81)
RDW: 13.6 % (ref 11.5–15.5)
WBC: 4.8 10*3/uL (ref 4.0–10.5)

## 2014-04-14 LAB — TROPONIN I
Troponin I: <0.3 ng/mL (ref <0.30)
Troponin I: <0.3 ng/mL (ref <0.30)

## 2014-04-14 MED ORDER — SODIUM CHLORIDE 0.9 % IJ SOLN
3.0000 mL | Freq: Two times a day (BID) | INTRAMUSCULAR | Status: DC
  Administered 2014-04-14 – 2014-04-15 (×2): 3 mL via INTRAVENOUS

## 2014-04-14 MED ORDER — PANTOPRAZOLE SODIUM 40 MG PO TBEC: 40.0000 mg | DELAYED_RELEASE_TABLET | Freq: Every day | ORAL | Status: DC

## 2014-04-14 MED ORDER — ONDANSETRON HCL 4 MG/2ML IJ SOLN: 4.0000 mg | Freq: Four times a day (QID) | INTRAMUSCULAR | Status: DC | PRN

## 2014-04-14 MED ORDER — HEPARIN SODIUM (PORCINE) 5000 UNIT/ML IJ SOLN
5000.0000 [IU] | Freq: Three times a day (TID) | INTRAMUSCULAR | Status: DC
  Administered 2014-04-14 – 2014-04-15 (×2): 5000 [IU] via SUBCUTANEOUS
  Filled 2014-04-14 (×2): qty 1

## 2014-04-14 MED ORDER — PAZOPANIB HCL 200 MG PO TABS: 400.0000 mg | ORAL_TABLET | Freq: Every day | ORAL | Status: DC

## 2014-04-14 MED ORDER — ONDANSETRON HCL 4 MG/2ML IJ SOLN
INTRAMUSCULAR | Status: AC
  Filled 2014-04-14: qty 2

## 2014-04-14 MED ORDER — PANTOPRAZOLE SODIUM 40 MG PO TBEC
40.0000 mg | DELAYED_RELEASE_TABLET | Freq: Every day | ORAL | Status: DC
  Administered 2014-04-15: 40 mg via ORAL
  Filled 2014-04-14: qty 1

## 2014-04-14 MED ORDER — NITROGLYCERIN 0.4 MG SL SUBL
0.4000 mg | SUBLINGUAL_TABLET | Freq: Once | SUBLINGUAL | Status: AC
  Administered 2014-04-14: 0.4 mg via SUBLINGUAL

## 2014-04-14 MED ORDER — MORPHINE SULFATE 2 MG/ML IJ SOLN
2.0000 mg | INTRAMUSCULAR | Status: DC | PRN
  Administered 2014-04-14 – 2014-04-15 (×2): 2 mg via INTRAVENOUS
  Filled 2014-04-14 (×3): qty 1

## 2014-04-14 MED ORDER — TOPIRAMATE 25 MG PO TABS
ORAL_TABLET | ORAL | Status: AC
  Filled 2014-04-14: qty 1

## 2014-04-14 MED ORDER — ONDANSETRON HCL 4 MG PO TABS: 4.0000 mg | ORAL_TABLET | Freq: Four times a day (QID) | ORAL | Status: DC | PRN

## 2014-04-14 MED ORDER — ONDANSETRON HCL 4 MG/2ML IJ SOLN: 4.0000 mg | Freq: Once | INTRAMUSCULAR | Status: DC

## 2014-04-14 MED ORDER — TOPIRAMATE 25 MG PO TABS
25.0000 mg | ORAL_TABLET | Freq: Two times a day (BID) | ORAL | Status: DC
  Administered 2014-04-14 – 2014-04-15 (×2): 25 mg via ORAL
  Filled 2014-04-14 (×7): qty 1

## 2014-04-14 MED ORDER — ONDANSETRON HCL 4 MG/2ML IJ SOLN
4.0000 mg | Freq: Once | INTRAMUSCULAR | Status: AC
  Administered 2014-04-14: 4 mg via INTRAVENOUS

## 2014-04-14 NOTE — ED Provider Notes (Signed)
CSN: [id number]     Arrival date & time 04/14/14  1539 History   First MD Initiated Contact with Patient 04/14/14 1559     Chief Complaint  Patient presents with  . Chest Pain     (Consider location/radiation/quality/duration/timing/severity/associated sxs/prior Treatment) HPI.... aching substernal chest pain with radiation to the back approximately 3 hours prior to arrival with associated dyspnea and nausea. No diaphoresis. No previous cardiac history. Nonsmoker. No family history. He cannot take aspirin secondary to his h/o renal cell carcinoma.  Pain is moderate to severe in nature.  Past Medical History  Diagnosis Date  . Gout   . DJD (degenerative joint disease) of cervical spine   . Diverticulosis of colon     by colonoscopy 11/02/2009  . Barrett's esophagus     by EGD 11/02/2009  . Cancer     "was told has cancer on other kidney and spot on pancreas"  . Anemia   . Renal cell cancer     with mets to pancreas   Past Surgical History  Procedure Laterality Date  . Nephrectomy      right due to cancer  . Lung removal, partial      left lower lobe, benign  . Eus  08/07/2012    Procedure: UPPER ENDOSCOPIC ULTRASOUND (EUS) LINEAR;  Surgeon: [name], MD;  Location: [facility];  Service: Endoscopy;  Laterality: N/A;  . Inguinal hernia repair    . Hernia repair    . Inguinal hernia repair      Right  . Eus  08/21/2012    Procedure: UPPER ENDOSCOPIC ULTRASOUND (EUS) LINEAR;  Surgeon: [name], MD;  Location: [facility];  Service: Endoscopy;  Laterality: N/A;  . Colonoscopy  Feb 2011    [name]: single anal papilla, pan-colonic diverticula  . Esophagogastroduodenoscopy  Feb 2011    [name]: salmon-colored epithelium consistent with Barrett's, nodular antral erosions, small hiatal hernia, negative H.pylori  . Colonoscopy  09/25/2012    RMR: Colonic diverticulosis. Suspect diverticular bleeding-hepatic flexure tic-sealed  . Esophagogastroduodenoscopy  09/25/2012     KGM:WNUUVOZ'D esophagus. Small hiatal hernia. Antral erosions of doubtful clinical significance, no biopsies  . Esophagogastroduodenoscopy N/A 03/11/2013    SLF: Barrett's esophagus/Single ulcer in the gastric antrum-PROBABLE SOURCE FOR BLACK STOOL/ Chronic gastritis in the gastric antrum   Family History  Problem Relation Age of Onset  . Lung cancer Brother   . Prostate cancer Brother   . Colon cancer Neg Hx   . Lumbar disc disease Brother   . Prostate cancer Brother    History  Substance Use Topics  . Smoking status: Former Smoker    Types: Cigars  . Smokeless tobacco: Never Used     Comment: occasional cigar  . Alcohol Use: Yes     Comment: vodka and budweiser, sometimes daily, depends on notion. couple drinks    Review of Systems  All other systems reviewed and are negative.     Allergies  Nsaids; Codeine; Demerol; and Meperidine hcl  Home Medications   Prior to Admission medications   Medication Sig Start Date End Date Taking? Authorizing Provider  indomethacin (INDOCIN) 50 MG capsule Take 1 capsule by mouth 3 (three) times daily as needed for mild pain (gout pain).  02/05/14  Yes Historical Provider, MD  pantoprazole (PROTONIX) 40 MG tablet Take 40 mg by mouth daily.   Yes Historical Provider, MD  topiramate (TOPAMAX) 25 MG tablet Take 25 mg by mouth 2 (two) times daily.  Yes Historical Provider, MD  VOTRIENT 200 MG tablet TAKE 2 TABLETS (400MG  TOTAL) BY MOUTH DAILY ON AN EMPTY STOMACH 04/05/14  Yes [name], MD   BP 103/61  Pulse 54  Temp(Src) 98.3 F (36.8 C) (Oral)  Resp 17  Ht 5\' 11"  (1.803 m)  Wt 169 lb (76.658 kg)  BMI 23.58 kg/m2  SpO2 100% Physical Exam  Nursing note and vitals reviewed. Constitutional: He is oriented to person, place, and time. He appears well-developed and well-nourished.  HENT:  Head: Normocephalic and atraumatic.  Eyes: Conjunctivae and EOM are normal. Pupils are equal, round, and reactive to light.  Neck: Normal range  of motion. Neck supple.  Cardiovascular: Normal rate, regular rhythm and normal heart sounds.   Pulmonary/Chest: Effort normal and breath sounds normal.  Abdominal: Soft. Bowel sounds are normal.  Musculoskeletal: Normal range of motion.  Neurological: He is alert and oriented to person, place, and time.  Skin: Skin is warm and dry.  Psychiatric: He has a normal mood and affect. His behavior is normal.    ED Course  Procedures (including critical care time) Labs Review Labs Reviewed  CBC - Abnormal; Notable for the following:    RBC 2.80 (*)    Hemoglobin 10.5 (*)    HCT 31.0 (*)    MCV 110.7 (*)    MCH 37.5 (*)    Platelets 126 (*)    All other components within normal limits  BASIC METABOLIC PANEL - Abnormal; Notable for the following:    GFR calc non Af Amer 61 (*)    GFR calc Af Amer 70 (*)    All other components within normal limits  TROPONIN I    Imaging Review Dg Chest Port 1 View  04/14/2014   CLINICAL DATA:  Shortness of breath.  Chest pain.  EXAM: PORTABLE CHEST - 1 VIEW  COMPARISON:  CT 03/23/2014 and 11/15/2013.  FINDINGS: Mediastinum and hilar structures normal. Borderline cardiomegaly with normal pulmonary vascularity. Stable pleural parenchymal thickening on the left consistent with scarring. No focal pulmonary infiltrate. No new pleural effusion or pneumothorax. Stable deformity left posterior sixth rib. No acute or focal osseous abnormality otherwise noted.  IMPRESSION: Stable chest with pleural parenchymal scarring on the left.   Electronically Signed   By: Marcello Moores  Register   On: 04/14/2014 16:45     EKG Interpretation   Date/Time:  Wednesday April 14 2014 15:50:41 EDT Ventricular Rate:  56 PR Interval:    QRS Duration: 88 QT Interval:  423 QTC Calculation: 408 R Axis:   6 Text Interpretation:  Junctional rhythm Confirmed by Savanah Bayles  MD, Nicholle Falzon  ([id number]) on 04/14/2014 4:51:28 PM      MDM   Final diagnoses:  Chest pain, unspecified chest pain type     Patient has a good story for cardiac related pain. EKG and first cardiac enzyme negative. Discussed with Dr. Anastasio Champion.   Admit     [name], MD 04/14/14 [phone]

## 2014-04-14 NOTE — ED Notes (Signed)
Dr Gosrani at bedside. 

## 2014-04-14 NOTE — ED Notes (Signed)
Attempted to pull SL nitro but not in pyxis stock. Pharmacy aware.

## 2014-04-14 NOTE — ED Notes (Signed)
Care plan discussed with pt and pt family. Pt daughter, tracy, reported she would be the one to pick up pt at d/c and wanted to leave contact information. [name]'s contact information is: [phone].

## 2014-04-14 NOTE — H&P (Signed)
[facility] History and Physical  Jeffrey Trujillo YHC:[id number] DOB: 02/11/1951 DOA: 04/14/2014  Referring physician: Dr. Lacinda Axon, ER. PCP: PROVIDER NOT IN SYSTEM   Chief Complaint: Chest pain.  HPI: Jeffrey Trujillo is a 64 y.o. male  This is a 63 year old man who had sudden onset of chest pain arrest at noon today. He was a heavy pain, but not associated with sweating or nausea. When he came to the emergency room a couple of hours later, he was given Nitrolingual and this has resolved his pain. He has never had a pain like this before. He is now being admitted for further investigation. He has a history of renal cell cancer.   Review of Systems:  Constitutional:  No weight loss, night sweats, Fevers, chills, fatigue.  HEENT:  No headaches, Difficulty swallowing,Tooth/dental problems,Sore throat,  No sneezing, itching, ear ache, nasal congestion, post nasal drip,   GI:  No heartburn, indigestion, abdominal pain, nausea, vomiting, diarrhea, change in bowel habits, loss of appetite  Resp:  No shortness of breath with exertion or at rest. No excess mucus, no productive cough, No non-productive cough, No coughing up of blood.No change in color of mucus.No wheezing.No chest wall deformity  Skin:  no rash or lesions.  GU:  no dysuria, change in color of urine, no urgency or frequency. No flank pain.  Musculoskeletal:  No joint pain or swelling. No decreased range of motion. No back pain.  Psych:  No change in mood or affect. No depression or anxiety. No memory loss.   Past Medical History  Diagnosis Date  . Gout   . DJD (degenerative joint disease) of cervical spine   . Diverticulosis of colon     by colonoscopy 11/02/2009  . Barrett's esophagus     by EGD 11/02/2009  . Cancer     "was told has cancer on other kidney and spot on pancreas"  . Anemia   . Renal cell cancer     with mets to pancreas   Past Surgical History  Procedure Laterality Date  . Nephrectomy     right due to cancer  . Lung removal, partial      left lower lobe, benign  . Eus  08/07/2012    Procedure: UPPER ENDOSCOPIC ULTRASOUND (EUS) LINEAR;  Surgeon: [name], MD;  Location: [facility];  Service: Endoscopy;  Laterality: N/A;  . Inguinal hernia repair    . Hernia repair    . Inguinal hernia repair      Right  . Eus  08/21/2012    Procedure: UPPER ENDOSCOPIC ULTRASOUND (EUS) LINEAR;  Surgeon: [name], MD;  Location: [facility];  Service: Endoscopy;  Laterality: N/A;  . Colonoscopy  Feb 2011    [name]: single anal papilla, pan-colonic diverticula  . Esophagogastroduodenoscopy  Feb 2011    [name]: salmon-colored epithelium consistent with Barrett's, nodular antral erosions, small hiatal hernia, negative H.pylori  . Colonoscopy  09/25/2012    RMR: Colonic diverticulosis. Suspect diverticular bleeding-hepatic flexure tic-sealed  . Esophagogastroduodenoscopy  09/25/2012    DVV:OHYWVPX'T esophagus. Small hiatal hernia. Antral erosions of doubtful clinical significance, no biopsies  . Esophagogastroduodenoscopy N/A 03/11/2013    SLF: Barrett's esophagus/Single ulcer in the gastric antrum-PROBABLE SOURCE FOR BLACK STOOL/ Chronic gastritis in the gastric antrum   Social History:  reports that he has quit smoking. His smoking use included Cigars. He has never used smokeless tobacco. He reports that he drinks alcohol. He reports that he does not use illicit  drugs.  Allergies  Allergen Reactions  . Nsaids Other (See Comments)    Patient only has one kidney.   . Codeine Rash  . Demerol [Meperidine] Rash  . Meperidine Hcl Hives and Rash    Family History  Problem Relation Age of Onset  . Lung cancer Brother   . Prostate cancer Brother   . Colon cancer Neg Hx   . Lumbar disc disease Brother   . Prostate cancer Brother      Prior to Admission medications   Medication Sig Start Date End Date Taking? Authorizing Provider  indomethacin (INDOCIN) 50 MG capsule Take  1 capsule by mouth 3 (three) times daily as needed for mild pain (gout pain).  02/05/14  Yes Historical Provider, MD  pantoprazole (PROTONIX) 40 MG tablet Take 40 mg by mouth daily.   Yes Historical Provider, MD  topiramate (TOPAMAX) 25 MG tablet Take 25 mg by mouth 2 (two) times daily.   Yes Historical Provider, MD  VOTRIENT 200 MG tablet TAKE 2 TABLETS (400MG  TOTAL) BY MOUTH DAILY ON AN EMPTY STOMACH 04/05/14  Yes [name], MD   Physical Exam: Filed Vitals:   04/14/14 1640 04/14/14 1700 04/14/14 1800 04/14/14 1808  BP: 107/68 113/72  103/61  Pulse: 58 74 66 54  Temp:      TempSrc:      Resp: 19 17    Height:      Weight:      SpO2: 100% 100% 100% 100%    Wt Readings from Last 3 Encounters:  04/14/14 76.658 kg (169 lb)  03/25/14 76.658 kg (169 lb)  02/17/14 76.703 kg (169 lb 1.6 oz)    General:  Appears calm and comfortable Eyes: PERRL, normal lids, irises & conjunctiva ENT: grossly normal hearing, lips & tongue Neck: no LAD, masses or thyromegaly Cardiovascular: RRR, no m/r/g. No LE edema. Telemetry: SR, no arrhythmias  Respiratory: CTA bilaterally, no w/r/r. Normal respiratory effort. Abdomen: soft, ntnd Skin: no rash or induration seen on limited exam Musculoskeletal: grossly normal tone BUE/BLE Psychiatric: grossly normal mood and affect, speech fluent and appropriate Neurologic: grossly non-focal.          Labs on Admission:  Basic Metabolic Panel:  Recent Labs Lab 04/14/14 1630  NA 143  K 3.7  CL 108  CO2 23  GLUCOSE 98  BUN 16  CREATININE 1.24  CALCIUM 9.1   Liver Function Tests: No results found for this basename: AST, ALT, ALKPHOS, BILITOT, PROT, ALBUMIN,  in the last 168 hours No results found for this basename: LIPASE, AMYLASE,  in the last 168 hours No results found for this basename: AMMONIA,  in the last 168 hours CBC:  Recent Labs Lab 04/14/14 1630  WBC 4.8  HGB 10.5*  HCT 31.0*  MCV 110.7*  PLT 126*   Cardiac Enzymes:  Recent  Labs Lab 04/14/14 1630  TROPONINI <0.30    BNP (last 3 results) No results found for this basename: PROBNP,  in the last 8760 hours CBG: No results found for this basename: GLUCAP,  in the last 168 hours  Radiological Exams on Admission: Dg Chest Port 1 View  04/14/2014   CLINICAL DATA:  Shortness of breath.  Chest pain.  EXAM: PORTABLE CHEST - 1 VIEW  COMPARISON:  CT 03/23/2014 and 11/15/2013.  FINDINGS: Mediastinum and hilar structures normal. Borderline cardiomegaly with normal pulmonary vascularity. Stable pleural parenchymal thickening on the left consistent with scarring. No focal pulmonary infiltrate. No new pleural effusion or pneumothorax.  Stable deformity left posterior sixth rib. No acute or focal osseous abnormality otherwise noted.  IMPRESSION: Stable chest with pleural parenchymal scarring on the left.   Electronically Signed   By: Marcello Moores  Register   On: 04/14/2014 16:45    EKG: Independently reviewed. Normal sinus rhythm without any ST-T wave changes.  Assessment/Plan   1. Chest pain, appears cardiac in origin clinically. No acute ST-T wave changes on electrocardiogram. Troponin is negative initially. 2. History of renal cell cancer on chemotherapy.  Plan: 1. Admit to telemetry. 2. Serial cardiac enzymes. 3. Cardiology consultation in the morning. He may need stress testing.  Further recommendations will depend on patient's hospital progress.   Code Status: Full code  DVT Prophylaxis: Heparin.  Family Communication: I discussed the plan with patient at the bedside.   Disposition Plan: Home when medically stable.   Time spent: 60 minutes.  Doree Albee [facility] Pager [phone].  **Disclaimer: This note may have been dictated with voice recognition software. Similar sounding words can inadvertently be transcribed and this note may contain transcription errors which may not have been corrected upon publication of note.**

## 2014-04-14 NOTE — Progress Notes (Signed)
Pt reports to nurse that he only takes 400 mg Votrient. Nurse notified Pharmacy, who said to put a sticky note in for pharmacist. Nurse left sticky note and will report this information off to day shift nurse. Pt is now pain free, resting in bed at lowest position and call bell within reach. Will continue to monitor.

## 2014-04-14 NOTE — ED Notes (Signed)
Second dose of Nitro SL .4mg  admin.

## 2014-04-14 NOTE — ED Notes (Addendum)
Pt bp trending down at time of nitro admin. Pt remains pale and mildly diaphoretic. Pt c/o of nausea after second dose of SL nitro. Pt given emesis bag and cool was rag applied to forehead.

## 2014-04-14 NOTE — Progress Notes (Signed)
Notified Dr Anastasio Champion again regarding chest pain. Ordered 2 mg Morphine IV Q4 hours PRN. Will administer immediately and continue to monitor pt frequently throughout night.

## 2014-04-14 NOTE — Progress Notes (Signed)
Pt c/o dull chest pain (mid chest) rated at 4 on pain scale. No PRN medications ordered. Notified Dr Anastasio Champion. Waiting to hear from physician. BP 115/60 HR 56. Will continue to monitor pt frequently throughout night. Pt resting in bed at lowest position and call bell is within reach.

## 2014-04-14 NOTE — ED Notes (Signed)
Patient c/o mid-sternal chest pain that radiates into back. Per patient started 3 hours ago. Patient reports shortness of breath but denies any nausea, vomiting, or weakness. Per patient on antibiotics for right ear infection.

## 2014-04-15 DIAGNOSIS — R0789 Other chest pain: Secondary | ICD-10-CM

## 2014-04-15 DIAGNOSIS — R079 Chest pain, unspecified: Secondary | ICD-10-CM | POA: Diagnosis present

## 2014-04-15 LAB — COMPREHENSIVE METABOLIC PANEL
ALT: 42 U/L (ref 0–53)
AST: 55 U/L — AB (ref 0–37)
Albumin: 3.1 g/dL — ABNORMAL LOW (ref 3.5–5.2)
Alkaline Phosphatase: 102 U/L (ref 39–117)
Anion gap: 9 (ref 5–15)
BUN: 13 mg/dL (ref 6–23)
CO2: 25 meq/L (ref 19–32)
Calcium: 8.6 mg/dL (ref 8.4–10.5)
Chloride: 110 mEq/L (ref 96–112)
Creatinine, Ser: 1.15 mg/dL (ref 0.50–1.35)
GFR calc Af Amer: 77 mL/min — ABNORMAL LOW (ref >90)
GFR, EST NON AFRICAN AMERICAN: 66 mL/min — AB (ref >90)
Glucose, Bld: 87 mg/dL (ref 70–99)
Potassium: 3.8 mEq/L (ref 3.7–5.3)
SODIUM: 144 meq/L (ref 137–147)
Total Bilirubin: 0.7 mg/dL (ref 0.3–1.2)
Total Protein: 6.5 g/dL (ref 6.0–8.3)

## 2014-04-15 LAB — CBC
HEMATOCRIT: 30.2 % — AB (ref 39.0–52.0)
Hemoglobin: 10.5 g/dL — ABNORMAL LOW (ref 13.0–17.0)
MCH: 39.3 pg — AB (ref 26.0–34.0)
MCHC: 34.8 g/dL (ref 30.0–36.0)
MCV: 113.1 fL — ABNORMAL HIGH (ref 78.0–100.0)
PLATELETS: 119 10*3/uL — AB (ref 150–400)
RBC: 2.67 MIL/uL — AB (ref 4.22–5.81)
RDW: 13.7 % (ref 11.5–15.5)
WBC: 2.9 10*3/uL — ABNORMAL LOW (ref 4.0–10.5)

## 2014-04-15 LAB — TROPONIN I

## 2014-04-15 LAB — TSH: TSH: 1.45 u[IU]/mL (ref 0.350–4.500)

## 2014-04-15 MED ORDER — INDOMETHACIN 50 MG PO CAPS: 50.0000 mg | ORAL_CAPSULE | Freq: Two times a day (BID) | ORAL | Status: DC

## 2014-04-15 MED ORDER — GI COCKTAIL ~~LOC~~
30.0000 mL | Freq: Once | ORAL | Status: AC
  Administered 2014-04-15: 30 mL via ORAL
  Filled 2014-04-15: qty 30

## 2014-04-15 MED ORDER — PANTOPRAZOLE SODIUM 40 MG PO TBEC: 40.0000 mg | DELAYED_RELEASE_TABLET | Freq: Two times a day (BID) | ORAL | Status: DC

## 2014-04-15 NOTE — Progress Notes (Signed)
Patient's heart rate sustaining in the low 40's.  Patient is asymptomatic.  Notified MD and will follow orders and print telemetry strips.  Strips are placed in chart.  Will continue to monitor patient.

## 2014-04-15 NOTE — Progress Notes (Signed)
Patient received discharge instructions and had no further questions.  Reinforced the medication instructions per MD orders.  Patient's IV was removed and clean, dry, and intact.  Patient was escorted to vehicle via wheelchair with nurse tech.

## 2014-04-15 NOTE — Discharge Summary (Signed)
Physician Discharge Summary  Jeffrey Trujillo LNL:[id number] DOB: 1951/01/15 DOA: 04/14/2014  PCP: PROVIDER NOT IN SYSTEM  Admit date: 04/14/2014 Discharge date: 04/15/2014  Time spent: 35 minutes  Recommendations for Outpatient Follow-up:  1. Needs outpatient followup with PCP/gastroenterology regarding GERD  2. Needs interval screening Barrett esophagus Q2 to 3 years-probably 2016-will forward the gastroenterologist 3. Consider long-term PPI therapy Protonix 40 twice a day  Discharge Diagnoses:  Active Problems:   Renal cell cancer   Chest pain at rest   Chest pain   Discharge Condition: Stable  Diet recommendation: Regular small meals  Filed Weights   04/14/14 1547  Weight: 76.658 kg (169 lb)    History of present illness:   Discharge diagnosis noncardiac chest pain likely severe GERD  63 y/o ?, known T1b R Kidney Ca2010 s/p Nephrectomy 06/2009 + L renal mass 11/13 + pancreatic tumour, chemo with Votrient complicated by Gastric ulcer; barrett's 02/2013, h/o severe diverticular bleed 09/25/12-4 U tx, admitted 04/14/14 with sudden onset CP. Stating that it did not feel like a pain in the stomach-has been seen in the emergency room the past for pain but never this bad Also complaining of mild cough and sputum over the past one to 2 days. He does have pain in the epigastric region with some radiation as well. EKG showed no changes other than junction rhythm with mild bradycardia 40s to 50s. He had no arm pain. Troponins were negative. He had lunch during hospital stay and couldn't he had abdominal discomfort was completely resolved with GI cocktail. It was felt that his symptomatology was secondary to reflux disease and he was started on discharge on Protonix twice a day up from daily I asked him to cut Back on his indomethacin to twice a day and only with meals   Discharge Exam: Filed Vitals:   04/15/14 1254  BP: 117/54  Pulse: 50  Temp:   Resp: 16    General: alert  pleasant oriented Cardiovascular: s1 s2 no m/r/g  Respiratory: clear  Discharge Instructions You were cared for by a hospitalist during your hospital stay. If you have any questions about your discharge medications or the care you received while you were in the hospital after you are discharged, you can call the unit and asked to speak with the hospitalist on call if the hospitalist that took care of you is not available. Once you are discharged, your primary care physician will handle any further medical issues. Please note that NO REFILLS for any discharge medications will be authorized once you are discharged, as it is imperative that you return to your primary care physician (or establish a relationship with a primary care physician if you do not have one) for your aftercare needs so that they can reassess your need for medications and monitor your lab values.     Medication List    ASK your doctor about these medications       indomethacin 50 MG capsule  Commonly known as:  INDOCIN  Take 1 capsule by mouth 3 (three) times daily as needed for mild pain (gout pain).     pantoprazole 40 MG tablet  Commonly known as:  PROTONIX  Take 40 mg by mouth daily.     topiramate 25 MG tablet  Commonly known as:  TOPAMAX  Take 25 mg by mouth 2 (two) times daily.     VOTRIENT 200 MG tablet  Generic drug:  pazopanib  TAKE 2 TABLETS (400MG  TOTAL) BY MOUTH DAILY ON  AN EMPTY STOMACH       Allergies  Allergen Reactions  . Nsaids Other (See Comments)    Patient only has one kidney.   . Demerol [Meperidine] Rash  . Meperidine Hcl Hives and Rash      The results of significant diagnostics from this hospitalization (including imaging, microbiology, ancillary and laboratory) are listed below for reference.    Significant Diagnostic Studies: Ct Abdomen Pelvis W Wo Contrast  03/23/2014   CLINICAL DATA:  Renal cell carcinoma diagnosed in 2010 status post right nephrectomy. Chemotherapy ongoing.   EXAM: CT CHEST WITH CONTRAST  CT ABDOMEN AND PELVIS WITH AND WITHOUT CONTRAST  TECHNIQUE: Multidetector CT imaging of the chest was performed during intravenous contrast administration. Multidetector CT imaging of the abdomen and pelvis was performed following the standard protocol before and during bolus administration of intravenous contrast.  CONTRAST:  135mL OMNIPAQUE IOHEXOL 300 MG/ML  SOLN  COMPARISON:  Prior examinations 11/25/2013 and 07/24/2013.  FINDINGS: CT CHEST FINDINGS  Small mediastinal lymph nodes are stable, not pathologically enlarged. There is stable minimal heterogeneity of the thyroid gland. Mild atherosclerosis and a small hiatal hernia are stable. There is no significant pleural or pericardial effusion.  There is stable volume loss and scarring in the left lower lobe. No suspicious pulmonary nodules or confluent airspace opacities are present. There is no endobronchial lesion.  There are no worrisome osseous findings.  CT ABDOMEN AND PELVIS FINDINGS  There is progressive enlargement of the heterogeneous enhancing mass involving the upper pole of the left kidney. This now measures 3.5 x 3.5 cm on image 104 of series 6. There is some inferior extension of tumor into the left renal sinus fat, although no definite involvement of the collecting system or left renal vein. 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  There is no retroperitoneal lymphadenopathy. There is no mass within the right nephrectomy bed. Both adrenal glands remain normal in appearance.  Hepatic steatosis and a small low-density lesion in the left hepatic lobe are stable. There are no new or enlarging liver lesions. The spleen and pancreas appear normal. There are possible small gallstones within the gallbladder lumen, best seen on the pre contrast images.  The stomach, small bowel and colon appear stable with mild sigmoid colon diverticular  changes. The prostate gland and bladder appear stable. There are no significant vascular findings or worrisome osseous findings.  IMPRESSION: 1. Progressive enlargement of heterogeneously enhancing mass in the upper pole of the left kidney consistent with renal cell carcinoma. 2. No evidence of metastatic disease status post right nephrectomy. Both adrenal glands remain normal in appearance. 3. Hepatic steatosis with stable low-density left hepatic lesion. 4. Probable cholelithiasis as before.   Electronically Signed   By: Camie Patience M.D.   On: 03/23/2014 16:06   Ct Chest W Contrast  03/23/2014   CLINICAL DATA:  Renal cell carcinoma diagnosed in 2010 status post right nephrectomy. Chemotherapy ongoing.  EXAM: CT CHEST WITH CONTRAST  CT ABDOMEN AND PELVIS WITH AND WITHOUT CONTRAST  TECHNIQUE: Multidetector CT imaging of the chest was performed during intravenous contrast administration. Multidetector CT imaging of the abdomen and pelvis was performed following the standard protocol before and during bolus administration of intravenous contrast.  CONTRAST:  166mL OMNIPAQUE IOHEXOL 300 MG/ML  SOLN  COMPARISON:  Prior examinations 11/25/2013 and 07/24/2013.  FINDINGS: CT CHEST FINDINGS  Small mediastinal lymph nodes are  stable, not pathologically enlarged. There is stable minimal heterogeneity of the thyroid gland. Mild atherosclerosis and a small hiatal hernia are stable. There is no significant pleural or pericardial effusion.  There is stable volume loss and scarring in the left lower lobe. No suspicious pulmonary nodules or confluent airspace opacities are present. There is no endobronchial lesion.  There are no worrisome osseous findings.  CT ABDOMEN AND PELVIS FINDINGS  There is progressive enlargement of the heterogeneous enhancing mass involving the upper pole of the left kidney. This now measures 3.5 x 3.5 cm on image 104 of series 6. There is some inferior extension of tumor into the left renal sinus fat,  although no definite involvement of the collecting system or left renal vein. 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  There is no retroperitoneal lymphadenopathy. There is no mass within the right nephrectomy bed. Both adrenal glands remain normal in appearance.  Hepatic steatosis and a small low-density lesion in the left hepatic lobe are stable. There are no new or enlarging liver lesions. The spleen and pancreas appear normal. There are possible small gallstones within the gallbladder lumen, best seen on the pre contrast images.  The stomach, small bowel and colon appear stable with mild sigmoid colon diverticular changes. The prostate gland and bladder appear stable. There are no significant vascular findings or worrisome osseous findings.  IMPRESSION: 1. Progressive enlargement of heterogeneously enhancing mass in the upper pole of the left kidney consistent with renal cell carcinoma. 2. No evidence of metastatic disease status post right nephrectomy. Both adrenal glands remain normal in appearance. 3. Hepatic steatosis with stable low-density left hepatic lesion. 4. Probable cholelithiasis as before.   Electronically Signed   By: Camie Patience M.D.   On: 03/23/2014 16:06   Dg Chest Port 1 View  04/14/2014   CLINICAL DATA:  Shortness of breath.  Chest pain.  EXAM: PORTABLE CHEST - 1 VIEW  COMPARISON:  CT 03/23/2014 and 11/15/2013.  FINDINGS: Mediastinum and hilar structures normal. Borderline cardiomegaly with normal pulmonary vascularity. Stable pleural parenchymal thickening on the left consistent with scarring. No focal pulmonary infiltrate. No new pleural effusion or pneumothorax. Stable deformity left posterior sixth rib. No acute or focal osseous abnormality otherwise noted.  IMPRESSION: Stable chest with pleural parenchymal scarring on the left.   Electronically Signed   By: Marcello Moores  Register   On: 04/14/2014  16:45    Microbiology: No results found for this or any previous visit (from the past 240 hour(s)).   Labs: Basic Metabolic Panel:  Recent Labs Lab 04/14/14 1630 04/15/14 0518  NA 143 144  K 3.7 3.8  CL 108 110  CO2 23 25  GLUCOSE 98 87  BUN 16 13  CREATININE 1.24 1.15  CALCIUM 9.1 8.6   Liver Function Tests:  Recent Labs Lab 04/15/14 0518  AST 55*  ALT 42  ALKPHOS 102  BILITOT 0.7  PROT 6.5  ALBUMIN 3.1*   No results found for this basename: LIPASE, AMYLASE,  in the last 168 hours No results found for this basename: AMMONIA,  in the last 168 hours CBC:  Recent Labs Lab 04/14/14 1630 04/15/14 0518  WBC 4.8 2.9*  HGB 10.5* 10.5*  HCT 31.0* 30.2*  MCV 110.7* 113.1*  PLT 126* 119*   Cardiac Enzymes:  Recent Labs Lab 04/14/14 1630 04/14/14 1934 04/15/14 0105 04/15/14 0641  TROPONINI <0.30 <0.30 <0.30 <0.30  BNP: BNP (last 3 results) No results found for this basename: PROBNP,  in the last 8760 hours CBG: No results found for this basename: GLUCAP,  in the last 168 hours     Signed:  Nita Sells  [facility] 04/15/2014, 1:58 PM

## 2014-04-15 NOTE — Progress Notes (Signed)
Pt called nurse c/o chest pain, rated as 4 on pain scale. Pt also having difficulty breathing. Placed O2 at 2L [place]. Vital signs taken and recorded. O2 100%. Wheezing heard throughout lung bases. Pt states, "This happens every so often. I just wait until it passes". Staying with pt. Administering 2 mg Morphine IV. After a few minutes, pt returns back to baseline. Will continue to monitor throughout night.

## 2014-04-15 NOTE — Progress Notes (Signed)
Nutrition Brief Note  Patient identified on the Malnutrition Screening Tool (MST) Report  Pt presents with chest pain. He has hx of renal cell cancer.   Wt Readings from Last 15 Encounters:  04/14/14 169 lb (76.658 kg)  03/25/14 169 lb (76.658 kg)  02/17/14 169 lb 1.6 oz (76.703 kg)  01/11/14 176 lb 6.4 oz (80.015 kg)  12/02/13 171 lb 6.4 oz (77.747 kg)  10/28/13 170 lb 8 oz (77.338 kg)  09/07/13 173 lb (78.472 kg)  07/29/13 170 lb 3.2 oz (77.202 kg)  06/19/13 172 lb 8 oz (78.245 kg)  06/09/13 172 lb (78.019 kg)  05/13/13 171 lb 9.6 oz (77.837 kg)  04/20/13 170 lb (77.111 kg)  04/09/13 172 lb 9.6 oz (78.291 kg)  03/10/13 172 lb 13.5 oz (78.4 kg)  03/10/13 172 lb 13.5 oz (78.4 kg)    Body mass index is 23.58 kg/(m^2). Patient meets criteria for normal range based on current BMI. Pt current weight is within usual body weight range 169-173#.  Current diet order is Heart Healthy, patient is consuming approximately n/a% of meals at this time. Labs and medications reviewed.   No nutrition interventions warranted at this time. If nutrition issues arise, please consult RD.   Colman Cater MS,RD,CSG,LDN Office: [phone] Pager: [phone]

## 2014-04-30 ENCOUNTER — Encounter: Payer: Self-pay | Admitting: Oncology

## 2014-04-30 ENCOUNTER — Ambulatory Visit (HOSPITAL_BASED_OUTPATIENT_CLINIC_OR_DEPARTMENT_OTHER): Payer: PRIVATE HEALTH INSURANCE | Admitting: Oncology

## 2014-04-30 ENCOUNTER — Other Ambulatory Visit (HOSPITAL_BASED_OUTPATIENT_CLINIC_OR_DEPARTMENT_OTHER): Payer: PRIVATE HEALTH INSURANCE

## 2014-04-30 ENCOUNTER — Telehealth: Payer: Self-pay | Admitting: Oncology

## 2014-04-30 VITALS — BP 118/75 | HR 84 | Temp 98.1°F | Resp 18 | Ht 71.0 in | Wt 162.9 lb

## 2014-04-30 DIAGNOSIS — D696 Thrombocytopenia, unspecified: Secondary | ICD-10-CM

## 2014-04-30 DIAGNOSIS — D649 Anemia, unspecified: Secondary | ICD-10-CM

## 2014-04-30 DIAGNOSIS — C649 Malignant neoplasm of unspecified kidney, except renal pelvis: Secondary | ICD-10-CM

## 2014-04-30 DIAGNOSIS — C7889 Secondary malignant neoplasm of other digestive organs: Secondary | ICD-10-CM

## 2014-04-30 DIAGNOSIS — R634 Abnormal weight loss: Secondary | ICD-10-CM

## 2014-04-30 LAB — CBC WITH DIFFERENTIAL/PLATELET
BASO%: 0.4 % (ref 0.0–2.0)
BASOS ABS: 0 10*3/uL (ref 0.0–0.1)
EOS%: 0.8 % (ref 0.0–7.0)
Eosinophils Absolute: 0 10*3/uL (ref 0.0–0.5)
HEMATOCRIT: 31.2 % — AB (ref 38.4–49.9)
HGB: 10.3 g/dL — ABNORMAL LOW (ref 13.0–17.1)
LYMPH%: 11 % — AB (ref 14.0–49.0)
MCH: 36.5 pg — AB (ref 27.2–33.4)
MCHC: 33.1 g/dL (ref 32.0–36.0)
MCV: 110.5 fL — ABNORMAL HIGH (ref 79.3–98.0)
MONO#: 0.4 10*3/uL (ref 0.1–0.9)
MONO%: 11 % (ref 0.0–14.0)
NEUT#: 2.5 10*3/uL (ref 1.5–6.5)
NEUT%: 76.8 % — AB (ref 39.0–75.0)
Platelets: 62 10*3/uL — ABNORMAL LOW (ref 140–400)
RBC: 2.82 10*6/uL — ABNORMAL LOW (ref 4.20–5.82)
RDW: 15.6 % — ABNORMAL HIGH (ref 11.0–14.6)
WBC: 3.2 10*3/uL — ABNORMAL LOW (ref 4.0–10.3)
lymph#: 0.4 10*3/uL — ABNORMAL LOW (ref 0.9–3.3)

## 2014-04-30 LAB — COMPREHENSIVE METABOLIC PANEL (CC13)
ALT: 25 U/L (ref 0–55)
AST: 46 U/L — ABNORMAL HIGH (ref 5–34)
Albumin: 3.6 g/dL (ref 3.5–5.0)
Alkaline Phosphatase: 100 U/L (ref 40–150)
Anion Gap: 9 mEq/L (ref 3–11)
BILIRUBIN TOTAL: 2.34 mg/dL — AB (ref 0.20–1.20)
BUN: 11.8 mg/dL (ref 7.0–26.0)
CALCIUM: 9.6 mg/dL (ref 8.4–10.4)
CHLORIDE: 101 meq/L (ref 98–109)
CO2: 30 mEq/L — ABNORMAL HIGH (ref 22–29)
CREATININE: 1.5 mg/dL — AB (ref 0.7–1.3)
Glucose: 118 mg/dl (ref 70–140)
Potassium: 3.7 mEq/L (ref 3.5–5.1)
Sodium: 140 mEq/L (ref 136–145)
Total Protein: 7.4 g/dL (ref 6.4–8.3)

## 2014-04-30 MED ORDER — MEGESTROL ACETATE 400 MG/10ML PO SUSP: 400.0000 mg | Freq: Two times a day (BID) | ORAL | Status: DC

## 2014-04-30 NOTE — Progress Notes (Signed)
Hematology and Oncology Follow Up Visit  Jeffrey Trujillo [id number] 04-14-1951 63 y.o.    Principle Diagnosis: 63 year old with the following issues:  1. T1b right kidney cancer diagnosed in 2010. He is S/P rdical nephrectomy on June 27, 2009. The pathological staging was T1b clear cell histology with Fuhrman grade 3/4.  2. Left Kidney mass  Measuring 4.5 x 2 7 x 3.6 irregular enhancing appears to be a new kidney cancer noted in 07/2012. 3. Pancreatic lesion. 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  Current therapy: Votrient  800 mg daily started in December 2013. Treatment held in 11/2012 due to increase LFTs. Votrient was resumed in April 2014 at 400 mg daily.  Interim History: Mr. Prowse presents for a follow up visit with his wife. Since his last visit, he was hospitalized briefly for chest pain without was felt to be GI in nature. Cardiac workup has been unrevealing and likely related to reflux symptoms. This is to have resolved without any problems at this time. He is reporting poor by mouth intake of about 7 pound weight loss. He continues to tolerate this medication without any new symptoms. He does not report any nausea or vomiting. Does not report any dysphasia or difficulty with swallowing. He denies chest pain, shortness of breath, abdominal pain,  No rashes but continues to have easy bruising.  He continues to work full-time without any difficulty at this time. He has not reported any constitutional symptom. He did not report any headaches blurred vision double vision. Does not report any motor sensory neuropathy or alteration of mental status. He does not report any depression or anxiety. He does not report any frequency urgency or hesitancy. He does not report any other urinary symptoms. He does not report any hematochezia or melena.  Does not report any lymphadenopathy or any constitutional symptoms. Rest of his review of systems is unremarkable.  Medications:  Unchanged by my review today. Current Outpatient Prescriptions  Medication Sig Dispense Refill  . indomethacin (INDOCIN) 50 MG capsule Take 1 capsule (50 mg total) by mouth 2 (two) times daily with a meal.      . megestrol (MEGACE) 400 MG/10ML suspension Take 10 mLs (400 mg total) by mouth 2 (two) times daily.  240 mL  0  . pantoprazole (PROTONIX) 40 MG tablet Take 1 tablet (40 mg total) by mouth 2 (two) times daily.  60 tablet  0  . topiramate (TOPAMAX) 25 MG tablet Take 25 mg by mouth 2 (two) times daily.      Marland Kitchen VOTRIENT 200 MG tablet TAKE 2 TABLETS (400MG  TOTAL) BY MOUTH DAILY ON AN EMPTY STOMACH  60 tablet  0   No current facility-administered medications for this visit.     Allergies:  Allergies  Allergen Reactions  . Nsaids Other (See Comments)    Patient only has one kidney.   . Meperidine Hcl Hives and Rash    Past Medical History, Surgical history, Social history, and Family History were reviewed and updated.   Physical Exam: Blood pressure 146/71, pulse 66, temperature 98 F (44.45 C), temperature source Oral, resp. rate 18, height 5\' 11"  (1.803 m), weight 176 lb 4 oz (80.01 kg). ECOG: 1 General appearance: alert and awake appeared in no active distress. Head: Normocephalic, without obvious abnormality.  Neck: no adenopathy, no masses Lymph nodes: Cervical, supraclavicular, and axillary nodes normal. Heart:regular rate and rhythm, S1, S2.  Lung:chest clear, no wheezing, or dullness to percussion. Abdomen: soft,  non-tender, without masses or organomegaly no shifting dullness or ascites EXT:no erythema, induration, or nodules. No edema. Neurological exam: No deficits noted. Skin: No rashes or lesions noted. Ecchymosis noted on his upper extremities.  CBC    Component Value Date/Time   WBC 3.2* 04/30/2014 1449   WBC 2.9* 04/15/2014 0518   RBC 2.82* 04/30/2014 1449   RBC 2.67* 04/15/2014 0518   HGB 10.3* 04/30/2014 1449   HGB 10.5* 04/15/2014 0518   HCT 31.2* 04/30/2014  1449   HCT 30.2* 04/15/2014 0518   PLT 62* 04/30/2014 1449   PLT 119* 04/15/2014 0518   MCV 110.5* 04/30/2014 1449   MCV 113.1* 04/15/2014 0518   MCH 36.5* 04/30/2014 1449   MCH 39.3* 04/15/2014 0518   MCHC 33.1 04/30/2014 1449   MCHC 34.8 04/15/2014 0518   RDW 15.6* 04/30/2014 1449   RDW 13.7 04/15/2014 0518   LYMPHSABS 0.4* 04/30/2014 1449   LYMPHSABS 0.3* 06/09/2013 1935   MONOABS 0.4 04/30/2014 1449   MONOABS 0.2 06/09/2013 1935   EOSABS 0.0 04/30/2014 1449   EOSABS 0.0 06/09/2013 1935   BASOSABS 0.0 04/30/2014 1449   BASOSABS 0.0 06/09/2013 1935      Impression and Plan:   A 63 year old gentleman with the following issues:   1. A history of renal cell carcinoma. He had stage T1b diagnosed in October 2010 of the right kidney, status post right nephrectomy. He also has left kidney cancer and pancreatic metastasis. He is currently on Votrient without any new complications. CT scan from 03/23/2014  showed stable disease for the most part. There is a slightly large in the kidney mass but compared to previous CT scans appear stable. He is now on 400 mg (50% dose reduction) and doing well. The plan is to continue with the current dose and schedule. We will repeat imaging studies in 3 months.  2. Pancreatic lesion. Biopsy proven to be malignant and likely represent renal cell cancer. Regressed on Votrient.   3. Thrombocytopenia: This is related to Votrient which have been stable in the past but have dropped down significantly since the last visit. He has no bleeding complications I will continue to monitor for the time being.  4. Anemia. Hgb is stable. No active bleeding. No transfusion is indicated.   5. Increase LFTs: back to normal now. Will continue to follow this.   6. weight loss: I gave him prescription for a gaze as well as instructions to use boost or Ensure multiple times a day.  7. Follow-up. In about 4-5 weeks.   [name] MD 04/30/2014

## 2014-04-30 NOTE — Telephone Encounter (Signed)
, °

## 2014-05-06 ENCOUNTER — Other Ambulatory Visit: Payer: Self-pay | Admitting: Oncology

## 2014-05-12 ENCOUNTER — Telehealth: Payer: Self-pay | Admitting: Oncology

## 2014-05-12 NOTE — Telephone Encounter (Signed)
Pt's wife cld r/s due to pt has another apt somewhere else...KJ

## 2014-05-25 ENCOUNTER — Telehealth: Payer: Self-pay | Admitting: Dietician

## 2014-05-25 NOTE — Telephone Encounter (Signed)
Brief Outpatient Oncology Nutrition Note  Patient has been identified to be at risk on malnutrition screen.  Wt Readings from Last 10 Encounters:  04/30/14 162 lb 14.4 oz (73.891 kg)  04/14/14 169 lb (76.658 kg)  03/25/14 169 lb (76.658 kg)  02/17/14 169 lb 1.6 oz (76.703 kg)  01/11/14 176 lb 6.4 oz (80.015 kg)  12/02/13 171 lb 6.4 oz (77.747 kg)  10/28/13 170 lb 8 oz (77.338 kg)  09/07/13 173 lb (78.472 kg)  07/29/13 170 lb 3.2 oz (77.202 kg)  06/19/13 172 lb 8 oz (78.245 kg)   Dx:  T1b right kidney cancer dx in 2010.  S/p radical nephrectomy.    Called patient due to continued weight loss.   Noted that patient has been started on Megace.   Patient was unavailable.  Message left with contact information for the [place] RD.  Antonieta Iba, RD, LDN

## 2014-06-03 ENCOUNTER — Ambulatory Visit: Payer: PRIVATE HEALTH INSURANCE | Admitting: Oncology

## 2014-06-03 ENCOUNTER — Other Ambulatory Visit: Payer: PRIVATE HEALTH INSURANCE

## 2014-06-08 ENCOUNTER — Telehealth: Payer: Self-pay | Admitting: Dietician

## 2014-06-08 NOTE — Telephone Encounter (Signed)
Patient returned call made on 05/25/14.  Wife reports patient is now eating well.  Boost coupons requested.  Will mail these along with tips to increase calories and protein and the contact information for the [place]. RD.  Antonieta Iba, RD, LDN

## 2014-06-15 ENCOUNTER — Other Ambulatory Visit (HOSPITAL_BASED_OUTPATIENT_CLINIC_OR_DEPARTMENT_OTHER): Payer: PRIVATE HEALTH INSURANCE

## 2014-06-15 ENCOUNTER — Other Ambulatory Visit: Payer: Self-pay

## 2014-06-15 ENCOUNTER — Ambulatory Visit (HOSPITAL_BASED_OUTPATIENT_CLINIC_OR_DEPARTMENT_OTHER): Payer: PRIVATE HEALTH INSURANCE | Admitting: Oncology

## 2014-06-15 ENCOUNTER — Telehealth: Payer: Self-pay | Admitting: Oncology

## 2014-06-15 ENCOUNTER — Encounter: Payer: Self-pay | Admitting: Oncology

## 2014-06-15 VITALS — BP 135/76 | HR 81 | Temp 97.6°F | Resp 18 | Ht 71.0 in | Wt 166.2 lb

## 2014-06-15 DIAGNOSIS — D649 Anemia, unspecified: Secondary | ICD-10-CM

## 2014-06-15 DIAGNOSIS — C649 Malignant neoplasm of unspecified kidney, except renal pelvis: Secondary | ICD-10-CM

## 2014-06-15 DIAGNOSIS — C7889 Secondary malignant neoplasm of other digestive organs: Secondary | ICD-10-CM

## 2014-06-15 DIAGNOSIS — R634 Abnormal weight loss: Secondary | ICD-10-CM

## 2014-06-15 DIAGNOSIS — D696 Thrombocytopenia, unspecified: Secondary | ICD-10-CM

## 2014-06-15 LAB — COMPREHENSIVE METABOLIC PANEL (CC13)
ALT: 17 U/L (ref 0–55)
AST: 30 U/L (ref 5–34)
Albumin: 3.3 g/dL — ABNORMAL LOW (ref 3.5–5.0)
Alkaline Phosphatase: 94 U/L (ref 40–150)
Anion Gap: 9 mEq/L (ref 3–11)
BILIRUBIN TOTAL: 1.78 mg/dL — AB (ref 0.20–1.20)
BUN: 26.8 mg/dL — ABNORMAL HIGH (ref 7.0–26.0)
CO2: 24 mEq/L (ref 22–29)
CREATININE: 1.5 mg/dL — AB (ref 0.7–1.3)
Calcium: 9.3 mg/dL (ref 8.4–10.4)
Chloride: 105 mEq/L (ref 98–109)
Glucose: 92 mg/dl (ref 70–140)
Potassium: 3.7 mEq/L (ref 3.5–5.1)
Sodium: 137 mEq/L (ref 136–145)
Total Protein: 8.1 g/dL (ref 6.4–8.3)

## 2014-06-15 LAB — CBC WITH DIFFERENTIAL/PLATELET
BASO%: 0.6 % (ref 0.0–2.0)
BASOS ABS: 0 10*3/uL (ref 0.0–0.1)
EOS ABS: 0 10*3/uL (ref 0.0–0.5)
EOS%: 0.7 % (ref 0.0–7.0)
HEMATOCRIT: 29.6 % — AB (ref 38.4–49.9)
HEMOGLOBIN: 10.2 g/dL — AB (ref 13.0–17.1)
LYMPH%: 8.7 % — ABNORMAL LOW (ref 14.0–49.0)
MCH: 37.8 pg — AB (ref 27.2–33.4)
MCHC: 34.3 g/dL (ref 32.0–36.0)
MCV: 110.3 fL — ABNORMAL HIGH (ref 79.3–98.0)
MONO#: 0.4 10*3/uL (ref 0.1–0.9)
MONO%: 11.9 % (ref 0.0–14.0)
NEUT%: 78.1 % — AB (ref 39.0–75.0)
NEUTROS ABS: 2.6 10*3/uL (ref 1.5–6.5)
Platelets: 62 10*3/uL — ABNORMAL LOW (ref 140–400)
RBC: 2.69 10*6/uL — ABNORMAL LOW (ref 4.20–5.82)
RDW: 20.9 % — AB (ref 11.0–14.6)
WBC: 3.4 10*3/uL — ABNORMAL LOW (ref 4.0–10.3)
lymph#: 0.3 10*3/uL — ABNORMAL LOW (ref 0.9–3.3)

## 2014-06-15 NOTE — Progress Notes (Signed)
Hematology and Oncology Follow Up Visit  Jeffrey Trujillo [id number] 07/19/1951 63 y.o.    Principle Diagnosis: 63 year old with the following issues:  1. T1b right kidney cancer diagnosed in 2010. He is S/P rdical nephrectomy on June 27, 2009. The pathological staging was T1b clear cell histology with Fuhrman grade 3/4.  2. Left Kidney mass  Measuring 4.5 x 2 7 x 3.6 irregular enhancing appears to be a new kidney cancer noted in 07/2012. 3. Pancreatic lesion. 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  Current therapy: Votrient  800 mg daily started in December 2013. Treatment held in 11/2012 due to increase LFTs. Votrient was resumed in April 2014 at 400 mg daily.  Interim History: Jeffrey Trujillo presents for a follow up visit with his wife. Since his last visit, he has been doing well and eating better. He has gained 4 pounds after starting Megace. He is also reporting a slight increase in his diarrhea symptoms. He is reporting to 3 loose bowel movements a day. He continues to tolerate Votrient without any new symptoms. He does not report any nausea or vomiting. Does not report any dysphasia or difficulty with swallowing. He denies chest pain, shortness of breath, abdominal pain,  No rashes but continues to have easy bruising.  He continues to work full-time without any difficulty at this time. He has not reported any constitutional symptom. He did not report any headaches blurred vision double vision. Does not report any motor sensory neuropathy or alteration of mental status. He does not report any depression or anxiety. He does not report any frequency urgency or hesitancy. He does not report any other urinary symptoms. He does not report any hematochezia or melena.  Does not report any lymphadenopathy or any constitutional symptoms. Rest of his review of systems is unremarkable.  Medications: Unchanged by my review today. Current Outpatient Prescriptions  Medication Sig  Dispense Refill  . indomethacin (INDOCIN) 50 MG capsule Take 1 capsule (50 mg total) by mouth 2 (two) times daily with a meal.      . megestrol (MEGACE) 400 MG/10ML suspension Take 10 mLs (400 mg total) by mouth 2 (two) times daily.  240 mL  0  . pantoprazole (PROTONIX) 40 MG tablet Take 1 tablet (40 mg total) by mouth 2 (two) times daily.  60 tablet  0  . topiramate (TOPAMAX) 25 MG tablet Take 25 mg by mouth 2 (two) times daily.      Marland Kitchen VOTRIENT 200 MG tablet TAKE 2 TABLETS (400MG  TOTAL) BY MOUTH DAILY ON AN EMPTY STOMACH  60 tablet  0   No current facility-administered medications for this visit.     Allergies:  Allergies  Allergen Reactions  . Nsaids Other (See Comments)    Patient only has one kidney.   . Meperidine Hcl Hives and Rash    Past Medical History, Surgical history, Social history, and Family History were reviewed and updated.   Physical Exam: Blood pressure 146/71, pulse 66, temperature 98 F (44.45 C), temperature source Oral, resp. rate 18, height 5\' 11"  (1.803 m), weight 176 lb 4 oz (80.01 kg). ECOG: 1 General appearance: alert and awake appeared in no active distress. Head: Normocephalic, without obvious abnormality.  Neck: no adenopathy, no masses Lymph nodes: Cervical, supraclavicular, and axillary nodes normal. Heart:regular rate and rhythm, S1, S2.  Lung:chest clear, no wheezing, or dullness to percussion. Abdomen: soft, non-tender, without masses or organomegaly no shifting dullness or ascites EXT:no erythema, induration, or  nodules. No edema. Neurological exam: No deficits noted. Skin: No rashes or lesions noted. Ecchymosis noted on his upper extremities.  CBC    Component Value Date/Time   WBC 3.4* 06/15/2014 1234   WBC 2.9* 04/15/2014 0518   RBC 2.69* 06/15/2014 1234   RBC 2.67* 04/15/2014 0518   HGB 10.2* 06/15/2014 1234   HGB 10.5* 04/15/2014 0518   HCT 29.6* 06/15/2014 1234   HCT 30.2* 04/15/2014 0518   PLT 62* 06/15/2014 1234   PLT 119* 04/15/2014  0518   MCV 110.3* 06/15/2014 1234   MCV 113.1* 04/15/2014 0518   MCH 37.8* 06/15/2014 1234   MCH 39.3* 04/15/2014 0518   MCHC 34.3 06/15/2014 1234   MCHC 34.8 04/15/2014 0518   RDW 20.9* 06/15/2014 1234   RDW 13.7 04/15/2014 0518   LYMPHSABS 0.3* 06/15/2014 1234   LYMPHSABS 0.3* 06/09/2013 1935   MONOABS 0.4 06/15/2014 1234   MONOABS 0.2 06/09/2013 1935   EOSABS 0.0 06/15/2014 1234   EOSABS 0.0 06/09/2013 1935   BASOSABS 0.0 06/15/2014 1234   BASOSABS 0.0 06/09/2013 1935      Impression and Plan:   A 63 year old gentleman with the following issues:   1. A history of renal cell carcinoma. He had stage T1b diagnosed in October 2010 of the right kidney, status post right nephrectomy. He also has left kidney cancer and pancreatic metastasis. He is currently on Votrient without any new complications. CT scan from 03/23/2014  showed stable disease for the most part. There is a slightly large in the kidney mass but compared to previous CT scans appear stable. He is now on 400 mg (50% dose reduction) and doing well. The plan is to continue with the current dose and schedule. We will repeat imaging studies in November of 2015.  2. Pancreatic lesion. Biopsy proven to be malignant and likely represent renal cell cancer. Regressed on Votrient.   3. Thrombocytopenia: This is related to Votrient which have been stable in the past but have dropped down significantly since the last visit. He has no bleeding complications I will continue to monitor for the time being.  4. Anemia. Hgb is stable. No active bleeding. No transfusion is indicated.   5. Increase LFTs: back to normal now. Will continue to follow this.   6. Weight loss: this is improved at this time with Megace.  7. Follow-up. In about 6 weeks after a CT scan.   Jeffrey Portela MD 06/15/2014

## 2014-06-15 NOTE — Telephone Encounter (Signed)
Pt confirmed labs/ov per 09/29 POF, gave pt AVS.... KJ °

## 2014-06-16 MED ORDER — PANTOPRAZOLE SODIUM 40 MG PO TBEC: 40.0000 mg | DELAYED_RELEASE_TABLET | Freq: Every day | ORAL | Status: DC

## 2014-06-16 NOTE — Telephone Encounter (Signed)
Looks like he never return for follow up last year and is overdue for EGD. Pantoprazole can reduce efficacy of Votrient which he takes for renal cell ca.   Please schedule him a nonurgent OV. Refill protonix X 2 only, will reduce to once daily only.

## 2014-06-23 ENCOUNTER — Other Ambulatory Visit: Payer: Self-pay | Admitting: Oncology

## 2014-07-12 ENCOUNTER — Emergency Department (HOSPITAL_COMMUNITY): Payer: PRIVATE HEALTH INSURANCE

## 2014-07-12 ENCOUNTER — Emergency Department (HOSPITAL_COMMUNITY)
Admission: EM | Admit: 2014-07-12 | Discharge: 2014-07-12 | Disposition: A | Discharge status: Home / Self Care | Payer: PRIVATE HEALTH INSURANCE | Attending: Emergency Medicine | Admitting: Emergency Medicine

## 2014-07-12 ENCOUNTER — Encounter (HOSPITAL_COMMUNITY): Payer: Self-pay | Admitting: Emergency Medicine

## 2014-07-12 DIAGNOSIS — B349 Viral infection, unspecified: Secondary | ICD-10-CM | POA: Insufficient documentation

## 2014-07-12 DIAGNOSIS — R197 Diarrhea, unspecified: Secondary | ICD-10-CM

## 2014-07-12 DIAGNOSIS — Z87891 Personal history of nicotine dependence: Secondary | ICD-10-CM | POA: Insufficient documentation

## 2014-07-12 DIAGNOSIS — Z85858 Personal history of malignant neoplasm of other endocrine glands: Secondary | ICD-10-CM | POA: Diagnosis not present

## 2014-07-12 DIAGNOSIS — M109 Gout, unspecified: Secondary | ICD-10-CM | POA: Insufficient documentation

## 2014-07-12 DIAGNOSIS — K2270 Barrett's esophagus without dysplasia: Secondary | ICD-10-CM | POA: Diagnosis not present

## 2014-07-12 DIAGNOSIS — Z85528 Personal history of other malignant neoplasm of kidney: Secondary | ICD-10-CM | POA: Diagnosis not present

## 2014-07-12 DIAGNOSIS — R112 Nausea with vomiting, unspecified: Secondary | ICD-10-CM

## 2014-07-12 DIAGNOSIS — Z791 Long term (current) use of non-steroidal anti-inflammatories (NSAID): Secondary | ICD-10-CM | POA: Diagnosis not present

## 2014-07-12 DIAGNOSIS — M1990 Unspecified osteoarthritis, unspecified site: Secondary | ICD-10-CM | POA: Insufficient documentation

## 2014-07-12 DIAGNOSIS — R059 Cough, unspecified: Secondary | ICD-10-CM

## 2014-07-12 DIAGNOSIS — Z79899 Other long term (current) drug therapy: Secondary | ICD-10-CM | POA: Insufficient documentation

## 2014-07-12 DIAGNOSIS — R05 Cough: Secondary | ICD-10-CM

## 2014-07-12 DIAGNOSIS — Z862 Personal history of diseases of the blood and blood-forming organs and certain disorders involving the immune mechanism: Secondary | ICD-10-CM | POA: Diagnosis not present

## 2014-07-12 LAB — COMPREHENSIVE METABOLIC PANEL
ALT: 25 U/L (ref 0–53)
ANION GAP: 12 (ref 5–15)
AST: 49 U/L — ABNORMAL HIGH (ref 0–37)
Albumin: 3.5 g/dL (ref 3.5–5.2)
Alkaline Phosphatase: 105 U/L (ref 39–117)
BUN: 17 mg/dL (ref 6–23)
CO2: 25 mEq/L (ref 19–32)
CREATININE: 1.46 mg/dL — AB (ref 0.50–1.35)
Calcium: 8.9 mg/dL (ref 8.4–10.5)
Chloride: 103 mEq/L (ref 96–112)
GFR calc Af Amer: 58 mL/min — ABNORMAL LOW (ref >90)
GFR calc non Af Amer: 50 mL/min — ABNORMAL LOW (ref >90)
Glucose, Bld: 90 mg/dL (ref 70–99)
Potassium: 3.6 mEq/L — ABNORMAL LOW (ref 3.7–5.3)
Sodium: 140 mEq/L (ref 137–147)
TOTAL PROTEIN: 7.7 g/dL (ref 6.0–8.3)
Total Bilirubin: 1.4 mg/dL — ABNORMAL HIGH (ref 0.3–1.2)

## 2014-07-12 LAB — CBC WITH DIFFERENTIAL/PLATELET
BASOS ABS: 0 10*3/uL (ref 0.0–0.1)
Basophils Relative: 0 % (ref 0–1)
EOS PCT: 1 % (ref 0–5)
Eosinophils Absolute: 0 10*3/uL (ref 0.0–0.7)
HEMATOCRIT: 26.1 % — AB (ref 39.0–52.0)
Hemoglobin: 9 g/dL — ABNORMAL LOW (ref 13.0–17.0)
Lymphocytes Relative: 12 % (ref 12–46)
Lymphs Abs: 0.3 10*3/uL — ABNORMAL LOW (ref 0.7–4.0)
MCH: 39 pg — ABNORMAL HIGH (ref 26.0–34.0)
MCHC: 34.5 g/dL (ref 30.0–36.0)
MCV: 113 fL — ABNORMAL HIGH (ref 78.0–100.0)
MONO ABS: 0.4 10*3/uL (ref 0.1–1.0)
Monocytes Relative: 14 % — ABNORMAL HIGH (ref 3–12)
NEUTROS ABS: 2.1 10*3/uL (ref 1.7–7.7)
Neutrophils Relative %: 73 % (ref 43–77)
Platelets: 78 10*3/uL — ABNORMAL LOW (ref 150–400)
RBC: 2.31 MIL/uL — ABNORMAL LOW (ref 4.22–5.81)
RDW: 18.5 % — AB (ref 11.5–15.5)
Smear Review: DECREASED
WBC: 2.9 10*3/uL — ABNORMAL LOW (ref 4.0–10.5)

## 2014-07-12 LAB — URINE MICROSCOPIC-ADD ON

## 2014-07-12 LAB — URINALYSIS, ROUTINE W REFLEX MICROSCOPIC
Bilirubin Urine: NEGATIVE
Glucose, UA: NEGATIVE mg/dL
Ketones, ur: NEGATIVE mg/dL
Leukocytes, UA: NEGATIVE
NITRITE: NEGATIVE
PH: 6 (ref 5.0–8.0)
Protein, ur: NEGATIVE mg/dL
SPECIFIC GRAVITY, URINE: 1.01 (ref 1.005–1.030)
Urobilinogen, UA: 4 mg/dL — ABNORMAL HIGH (ref 0.0–1.0)

## 2014-07-12 MED ORDER — ONDANSETRON HCL 4 MG/2ML IJ SOLN
4.0000 mg | Freq: Once | INTRAMUSCULAR | Status: AC
  Administered 2014-07-12: 4 mg via INTRAVENOUS
  Filled 2014-07-12: qty 2

## 2014-07-12 MED ORDER — LOPERAMIDE HCL 2 MG PO CAPS
4.0000 mg | ORAL_CAPSULE | Freq: Once | ORAL | Status: AC
  Administered 2014-07-12: 4 mg via ORAL
  Filled 2014-07-12: qty 2

## 2014-07-12 MED ORDER — ACETAMINOPHEN 500 MG PO TABS
1000.0000 mg | ORAL_TABLET | Freq: Once | ORAL | Status: AC
  Administered 2014-07-12: 1000 mg via ORAL
  Filled 2014-07-12: qty 2

## 2014-07-12 MED ORDER — SODIUM CHLORIDE 0.9 % IV BOLUS (SEPSIS)
1000.0000 mL | Freq: Once | INTRAVENOUS | Status: AC
  Administered 2014-07-12: 1000 mL via INTRAVENOUS

## 2014-07-12 NOTE — ED Notes (Signed)
Patient unable to give a urine sample at this time.

## 2014-07-12 NOTE — ED Provider Notes (Signed)
TIME SEEN: 12:10 PM  CHIEF COMPLAINT: Nausea, vomiting, diarrhea, body aches, cough, congestion, sore throat   HPI: Patient is a 63 year old male with history of renal cell carcinoma status post right nephrectomy currently on oral chemotherapy it presents an emergency department with three days of body aches, chills, nausea, one episode of vomiting, several episodes of non-bloody diarrhea, cough with illustrator production, nasal congestion, sore throat. Patient denies any sick contacts or recent travel. He has not had an influenza vaccination this year. Denies any chest pain or abdominal pain. No dysuria or hematuria. No rash. No headaches, neck pain or neck stiffness.  ROS: See HPI Constitutional: no fever  Eyes: no drainage  ENT:  runny nose   Cardiovascular:  no chest pain  Resp: no SOB  GI: vomiting GU: no dysuria Integumentary: no rash  Allergy: no hives  Musculoskeletal: no leg swelling  Neurological: no slurred speech ROS otherwise negative  PAST MEDICAL HISTORY/PAST SURGICAL HISTORY:  Past Medical History  Diagnosis Date  . Gout   . DJD (degenerative joint disease) of cervical spine   . Diverticulosis of colon     by colonoscopy 11/02/2009  . Barrett's esophagus     by EGD 11/02/2009  . Anemia   . Cancer     "was told has cancer on other kidney and spot on pancreas"  . Renal cell cancer     with mets to pancreas    MEDICATIONS:  Prior to Admission medications   Medication Sig Start Date End Date Taking? Authorizing Provider  indomethacin (INDOCIN) 50 MG capsule Take 1 capsule (50 mg total) by mouth 2 (two) times daily with a meal. 04/15/14   Nita Sells, MD  megestrol (MEGACE) 400 MG/10ML suspension Take 10 mLs (400 mg total) by mouth 2 (two) times daily. 04/30/14   [name], MD  pantoprazole (PROTONIX) 40 MG tablet Take 1 tablet (40 mg total) by mouth daily. 06/16/14   [name], PA-C  topiramate (TOPAMAX) 25 MG tablet Take 25 mg by mouth 2 (two)  times daily.    Historical Provider, MD  VOTRIENT 200 MG tablet TAKE 2 TABLETS (400MG  TOTAL) BY MOUTH DAILY ON AN EMPTY STOMACH 06/23/14   [name], MD    ALLERGIES:  Allergies  Allergen Reactions  . Nsaids Other (See Comments)    Patient only has one kidney.   . Demerol [Meperidine] Rash  . Meperidine Hcl Hives and Rash    SOCIAL HISTORY:  History  Substance Use Topics  . Smoking status: Former Smoker    Types: Cigars  . Smokeless tobacco: Never Used     Comment: occasional cigar  . Alcohol Use: Yes     Comment: vodka and budweiser, sometimes daily, depends on notion. couple drinks    FAMILY HISTORY: Family History  Problem Relation Age of Onset  . Lung cancer Brother   . Prostate cancer Brother   . Colon cancer Neg Hx   . Lumbar disc disease Brother   . Prostate cancer Brother     EXAM: BP 153/78  Pulse 86  Temp(Src) 97.8 F (36.6 C) (Oral)  Resp 18  Ht 5\' 11"  (1.803 m)  Wt 162 lb (73.483 kg)  BMI 22.60 kg/m2  SpO2 100% CONSTITUTIONAL: Alert and oriented and responds appropriately to questions. Well-appearing; well-nourished, non-toxic HEAD: Normocephalic EYES: Conjunctivae clear, PERRL ENT: normal nose; no rhinorrhea; moist mucous membranes; pharynx without lesions noted NECK: Supple, no meningismus, no LAD  CARD: RRR; S1 and S2 appreciated;  no murmurs, no clicks, no rubs, no gallops RESP: Normal chest excursion without splinting or tachypnea; breath sounds clear and equal bilaterally; no wheezes, no rhonchi, no rales, no respiratory distress or hypoxia, speaking full sentences ABD/GI: Normal bowel sounds; non-distended; soft, non-tender, no rebound, no guarding BACK:  The back appears normal and is non-tender to palpation, there is no CVA tenderness EXT: Normal ROM in all joints; non-tender to palpation; no edema; normal capillary refill; no cyanosis    SKIN: Normal color for age and race; warm NEURO: Moves all extremities equally;sensation to light  touch and tech diffusely, normal gait PSYCH: The patient's mood and manner are appropriate. Grooming and personal hygiene are appropriate.  MEDICAL DECISION MAKING: patient here with multiple symptoms likely suggestive of viral syndrome. Given patient is on oral chemotherapy however, will obtain chest x-ray and urinalysis to evaluate for possible bacterial causes of infection. He is a febrile here, non-toxic, well appearing. Very low suspicion for bacteremia. Given he is also have multiple pieces of diarrhea, will check abdominal labs. His abdominal exam is benign. Will treat symptomatically with IV fluids, Tylenol, Zofran, Imodium.  ED PROGRESS: Patient's lab show a pancytopenia which is chronic for him. He is not neutropenic. His electrolytes are within normal limits, creatinine is 1.45 which is stable for him. Chest x-ray shows no infiltrator edema. Urinalysis shows trace hemoglobin but no other sign of infection. Patient reports feeling better. Have offered him a Tamiflu prescription but he declines. Given he is very well appearing, hemodynamically stable, I feel he is safe to be discharged home. Have discussed with him stricter term precautions and support your instructions. Patient and wife verbalize understanding and are comfortable with plan.     [place], DO 07/12/14 2242

## 2014-07-12 NOTE — ED Notes (Signed)
Diarrhea for 3 days,  Alert,  Ambulatory into triage.   Diarrhea "dark " at times, no blood seen  No N/V  No fever.

## 2014-07-12 NOTE — Discharge Instructions (Signed)
He may take Tylenol 1000 mg every 6 hours as needed for fever and pain. He may also take Imodium over-the-counter as needed for diarrhea.   Diarrhea Diarrhea is frequent loose and watery bowel movements. It can cause you to feel weak and dehydrated. Dehydration can cause you to become tired and thirsty, have a dry mouth, and have decreased urination that often is dark yellow. Diarrhea is a sign of another problem, most often an infection that will not last long. In most cases, diarrhea typically lasts 2-3 days. However, it can last longer if it is a sign of something more serious. It is important to treat your diarrhea as directed by your caregiver to lessen or prevent future episodes of diarrhea. CAUSES  Some common causes include:  Gastrointestinal infections caused by viruses, bacteria, or parasites.  Food poisoning or food allergies.  Certain medicines, such as antibiotics, chemotherapy, and laxatives.  Artificial sweeteners and fructose.  Digestive disorders. HOME CARE INSTRUCTIONS  Ensure adequate fluid intake (hydration): Have 1 cup (8 oz) of fluid for each diarrhea episode. Avoid fluids that contain simple sugars or sports drinks, fruit juices, whole milk products, and sodas. Your urine should be clear or pale yellow if you are drinking enough fluids. Hydrate with an oral rehydration solution that you can purchase at pharmacies, retail stores, and online. You can prepare an oral rehydration solution at home by mixing the following ingredients together:   - tsp table salt.   tsp baking soda.   tsp salt substitute containing potassium chloride.  1  tablespoons sugar.  1 L (34 oz) of water.  Certain foods and beverages may increase the speed at which food moves through the gastrointestinal (GI) tract. These foods and beverages should be avoided and include:  Caffeinated and alcoholic beverages.  High-fiber foods, such as raw fruits and vegetables, nuts, seeds, and whole grain  breads and cereals.  Foods and beverages sweetened with sugar alcohols, such as xylitol, sorbitol, and mannitol.  Some foods may be well tolerated and may help thicken stool including:  Starchy foods, such as rice, toast, pasta, low-sugar cereal, oatmeal, grits, baked potatoes, crackers, and bagels.  Bananas.  Applesauce.  Add probiotic-rich foods to help increase healthy bacteria in the GI tract, such as yogurt and fermented milk products.  Wash your hands well after each diarrhea episode.  Only take over-the-counter or prescription medicines as directed by your caregiver.  Take a warm bath to relieve any burning or pain from frequent diarrhea episodes. SEEK IMMEDIATE MEDICAL CARE IF:   You are unable to keep fluids down.  You have persistent vomiting.  You have blood in your stool, or your stools are black and tarry.  You do not urinate in 6-8 hours, or there is only a small amount of very dark urine.  You have abdominal pain that increases or localizes.  You have weakness, dizziness, confusion, or light-headedness.  You have a severe headache.  Your diarrhea gets worse or does not get better.  You have a fever or persistent symptoms for more than 2-3 days.  You have a fever and your symptoms suddenly get worse. MAKE SURE YOU:   Understand these instructions.  Will watch your condition.  Will get help right away if you are not doing well or get worse. Document Released: 08/24/2002 Document Revised: 01/18/2014 Document Reviewed: 05/11/2012 [facility] Patient Information 2015 [place], [place]. This information is not intended to replace advice given to you by your health care provider. Make sure you  discuss any questions you have with your health care provider.  Nausea and Vomiting Nausea is a sick feeling that often comes before throwing up (vomiting). Vomiting is a reflex where stomach contents come out of your mouth. Vomiting can cause severe loss of body fluids  (dehydration). Children and elderly adults can become dehydrated quickly, especially if they also have diarrhea. Nausea and vomiting are symptoms of a condition or disease. It is important to find the cause of your symptoms. CAUSES   Direct irritation of the stomach lining. This irritation can result from increased acid production (gastroesophageal reflux disease), infection, food poisoning, taking certain medicines (such as nonsteroidal anti-inflammatory drugs), alcohol use, or tobacco use.  Signals from the brain.These signals could be caused by a headache, heat exposure, an inner ear disturbance, increased pressure in the brain from injury, infection, a tumor, or a concussion, pain, emotional stimulus, or metabolic problems.  An obstruction in the gastrointestinal tract (bowel obstruction).  Illnesses such as diabetes, hepatitis, gallbladder problems, appendicitis, kidney problems, cancer, sepsis, atypical symptoms of a heart attack, or eating disorders.  Medical treatments such as chemotherapy and radiation.  Receiving medicine that makes you sleep (general anesthetic) during surgery. DIAGNOSIS Your caregiver may ask for tests to be done if the problems do not improve after a few days. Tests may also be done if symptoms are severe or if the reason for the nausea and vomiting is not clear. Tests may include:  Urine tests.  Blood tests.  Stool tests.  Cultures (to look for evidence of infection).  X-rays or other imaging studies. Test results can help your caregiver make decisions about treatment or the need for additional tests. TREATMENT You need to stay well hydrated. Drink frequently but in small amounts.You may wish to drink water, sports drinks, clear broth, or eat frozen ice pops or gelatin dessert to help stay hydrated.When you eat, eating slowly may help prevent nausea.There are also some antinausea medicines that may help prevent nausea. HOME CARE INSTRUCTIONS   Take  all medicine as directed by your caregiver.  If you do not have an appetite, do not force yourself to eat. However, you must continue to drink fluids.  If you have an appetite, eat a normal diet unless your caregiver tells you differently.  Eat a variety of complex carbohydrates (rice, wheat, potatoes, bread), lean meats, yogurt, fruits, and vegetables.  Avoid high-fat foods because they are more difficult to digest.  Drink enough water and fluids to keep your urine clear or pale yellow.  If you are dehydrated, ask your caregiver for specific rehydration instructions. Signs of dehydration may include:  Severe thirst.  Dry lips and mouth.  Dizziness.  Dark urine.  Decreasing urine frequency and amount.  Confusion.  Rapid breathing or pulse. SEEK IMMEDIATE MEDICAL CARE IF:   You have blood or brown flecks (like coffee grounds) in your vomit.  You have black or bloody stools.  You have a severe headache or stiff neck.  You are confused.  You have severe abdominal pain.  You have chest pain or trouble breathing.  You do not urinate at least once every 8 hours.  You develop cold or clammy skin.  You continue to vomit for longer than 24 to 48 hours.  You have a fever. MAKE SURE YOU:   Understand these instructions.  Will watch your condition.  Will get help right away if you are not doing well or get worse. Document Released: 09/03/2005 Document Revised: 11/26/2011 Document Reviewed:  01/31/2011 [employer] Patient Information 2015 [place], [place]. This information is not intended to replace advice given to you by your health care provider. Make sure you discuss any questions you have with your health care provider.  Viral Infections A viral infection can be caused by different types of viruses.Most viral infections are not serious and resolve on their own. However, some infections may cause severe symptoms and may lead to further complications. SYMPTOMS Viruses  can frequently cause:  Minor sore throat.  Aches and pains.  Headaches.  Runny nose.  Different types of rashes.  Watery eyes.  Tiredness.  Cough.  Loss of appetite.  Gastrointestinal infections, resulting in nausea, vomiting, and diarrhea. These symptoms do not respond to antibiotics because the infection is not caused by bacteria. However, you might catch a bacterial infection following the viral infection. This is sometimes called a "superinfection." Symptoms of such a bacterial infection may include:  Worsening sore throat with pus and difficulty swallowing.  Swollen neck glands.  Chills and a high or persistent fever.  Severe headache.  Tenderness over the sinuses.  Persistent overall ill feeling (malaise), muscle aches, and tiredness (fatigue).  Persistent cough.  Yellow, green, or brown mucus production with coughing. HOME CARE INSTRUCTIONS   Only take over-the-counter or prescription medicines for pain, discomfort, diarrhea, or fever as directed by your caregiver.  Drink enough water and fluids to keep your urine clear or pale yellow. Sports drinks can provide valuable electrolytes, sugars, and hydration.  Get plenty of rest and maintain proper nutrition. Soups and broths with crackers or rice are fine. SEEK IMMEDIATE MEDICAL CARE IF:   You have severe headaches, shortness of breath, chest pain, neck pain, or an unusual rash.  You have uncontrolled vomiting, diarrhea, or you are unable to keep down fluids.  You or your child has an oral temperature above 102 F (38.9 C), not controlled by medicine.  Your baby is older than 3 months with a rectal temperature of 102 F (38.9 C) or higher.  Your baby is 72 months old or younger with a rectal temperature of 100.4 F (38 C) or higher. MAKE SURE YOU:   Understand these instructions.  Will watch your condition.  Will get help right away if you are not doing well or get worse. Document Released:  06/13/2005 Document Revised: 11/26/2011 Document Reviewed: 01/08/2011 [facility] Patient Information 2015 [place], [place]. This information is not intended to replace advice given to you by your health care provider. Make sure you discuss any questions you have with your health care provider.

## 2014-07-15 ENCOUNTER — Ambulatory Visit: Payer: PRIVATE HEALTH INSURANCE | Admitting: Gastroenterology

## 2014-07-27 ENCOUNTER — Other Ambulatory Visit: Payer: PRIVATE HEALTH INSURANCE

## 2014-07-28 ENCOUNTER — Other Ambulatory Visit (HOSPITAL_BASED_OUTPATIENT_CLINIC_OR_DEPARTMENT_OTHER): Payer: PRIVATE HEALTH INSURANCE

## 2014-07-28 ENCOUNTER — Encounter: Payer: Self-pay | Admitting: General Practice

## 2014-07-28 ENCOUNTER — Encounter (HOSPITAL_COMMUNITY): Payer: Self-pay

## 2014-07-28 ENCOUNTER — Ambulatory Visit (HOSPITAL_COMMUNITY)
Admission: RE | Admit: 2014-07-28 | Discharge: 2014-07-28 | Disposition: A | Discharge status: Home / Self Care | Payer: No Typology Code available for payment source | Source: Ambulatory Visit | Attending: Oncology | Admitting: Oncology

## 2014-07-28 DIAGNOSIS — C649 Malignant neoplasm of unspecified kidney, except renal pelvis: Secondary | ICD-10-CM

## 2014-07-28 DIAGNOSIS — N2889 Other specified disorders of kidney and ureter: Secondary | ICD-10-CM | POA: Diagnosis not present

## 2014-07-28 DIAGNOSIS — N281 Cyst of kidney, acquired: Secondary | ICD-10-CM | POA: Insufficient documentation

## 2014-07-28 DIAGNOSIS — K760 Fatty (change of) liver, not elsewhere classified: Secondary | ICD-10-CM | POA: Diagnosis not present

## 2014-07-28 DIAGNOSIS — Z905 Acquired absence of kidney: Secondary | ICD-10-CM | POA: Insufficient documentation

## 2014-07-28 DIAGNOSIS — R197 Diarrhea, unspecified: Secondary | ICD-10-CM | POA: Diagnosis present

## 2014-07-28 DIAGNOSIS — C642 Malignant neoplasm of left kidney, except renal pelvis: Secondary | ICD-10-CM | POA: Diagnosis not present

## 2014-07-28 DIAGNOSIS — C641 Malignant neoplasm of right kidney, except renal pelvis: Secondary | ICD-10-CM | POA: Diagnosis not present

## 2014-07-28 DIAGNOSIS — K769 Liver disease, unspecified: Secondary | ICD-10-CM | POA: Insufficient documentation

## 2014-07-28 DIAGNOSIS — R634 Abnormal weight loss: Secondary | ICD-10-CM | POA: Diagnosis present

## 2014-07-28 LAB — CBC WITH DIFFERENTIAL/PLATELET
BASO%: 0.6 % (ref 0.0–2.0)
Basophils Absolute: 0 10*3/uL (ref 0.0–0.1)
EOS%: 1.2 % (ref 0.0–7.0)
Eosinophils Absolute: 0 10*3/uL (ref 0.0–0.5)
HEMATOCRIT: 31.8 % — AB (ref 38.4–49.9)
HGB: 10.5 g/dL — ABNORMAL LOW (ref 13.0–17.1)
LYMPH#: 0.5 10*3/uL — AB (ref 0.9–3.3)
LYMPH%: 12.7 % — ABNORMAL LOW (ref 14.0–49.0)
MCH: 37.1 pg — AB (ref 27.2–33.4)
MCHC: 33.1 g/dL (ref 32.0–36.0)
MCV: 112.2 fL — AB (ref 79.3–98.0)
MONO#: 0.3 10*3/uL (ref 0.1–0.9)
MONO%: 7.5 % (ref 0.0–14.0)
NEUT#: 2.9 10*3/uL (ref 1.5–6.5)
NEUT%: 78 % — AB (ref 39.0–75.0)
Platelets: 98 10*3/uL — ABNORMAL LOW (ref 140–400)
RBC: 2.83 10*6/uL — ABNORMAL LOW (ref 4.20–5.82)
RDW: 19.6 % — AB (ref 11.0–14.6)
WBC: 3.7 10*3/uL — ABNORMAL LOW (ref 4.0–10.3)

## 2014-07-28 LAB — COMPREHENSIVE METABOLIC PANEL (CC13)
ALT: 21 U/L (ref 0–55)
AST: 40 U/L — ABNORMAL HIGH (ref 5–34)
Albumin: 3.5 g/dL (ref 3.5–5.0)
Alkaline Phosphatase: 127 U/L (ref 40–150)
Anion Gap: 1 mEq/L — ABNORMAL LOW (ref 3–11)
BUN: 8.5 mg/dL (ref 7.0–26.0)
CALCIUM: 9.2 mg/dL (ref 8.4–10.4)
CHLORIDE: 109 meq/L (ref 98–109)
CO2: 27 mEq/L (ref 22–29)
Creatinine: 1.3 mg/dL (ref 0.7–1.3)
Glucose: 91 mg/dl (ref 70–140)
Potassium: 4.6 mEq/L (ref 3.5–5.1)
Sodium: 138 mEq/L (ref 136–145)
Total Bilirubin: 1.28 mg/dL — ABNORMAL HIGH (ref 0.20–1.20)
Total Protein: 7.2 g/dL (ref 6.4–8.3)

## 2014-07-28 MED ORDER — IOHEXOL 300 MG/ML  SOLN
100.0000 mL | Freq: Once | INTRAMUSCULAR | Status: AC | PRN
  Administered 2014-07-28: 100 mL via INTRAVENOUS

## 2014-07-28 NOTE — Progress Notes (Signed)
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  Per Ms Basnett, she provides most of the care and support herself.  She raised concerns about pt's stress level in coping with illness:  "I asked his doctor if the medication could cause this [irratibility or impatience], but he said he didn't think so."  She named pt's stress in balancing desire to work (source of [employer], community) and need for rest.  Served as a witness to their story, offering emotional support, affirmation, encouragement, and opportunity for [place] to interpret his experience by making cards for family and others living with cancer.  Family verbalized appreciation.  [place], [place]

## 2014-07-29 ENCOUNTER — Ambulatory Visit: Payer: PRIVATE HEALTH INSURANCE | Admitting: Oncology

## 2014-07-30 ENCOUNTER — Ambulatory Visit (HOSPITAL_BASED_OUTPATIENT_CLINIC_OR_DEPARTMENT_OTHER): Payer: PRIVATE HEALTH INSURANCE | Admitting: Oncology

## 2014-07-30 ENCOUNTER — Telehealth: Payer: Self-pay | Admitting: Oncology

## 2014-07-30 VITALS — BP 142/64 | HR 69 | Temp 98.7°F | Resp 18 | Ht 71.0 in | Wt 169.7 lb

## 2014-07-30 DIAGNOSIS — D696 Thrombocytopenia, unspecified: Secondary | ICD-10-CM

## 2014-07-30 DIAGNOSIS — C259 Malignant neoplasm of pancreas, unspecified: Secondary | ICD-10-CM

## 2014-07-30 DIAGNOSIS — D649 Anemia, unspecified: Secondary | ICD-10-CM

## 2014-07-30 DIAGNOSIS — Z85528 Personal history of other malignant neoplasm of kidney: Secondary | ICD-10-CM

## 2014-07-30 DIAGNOSIS — C649 Malignant neoplasm of unspecified kidney, except renal pelvis: Secondary | ICD-10-CM

## 2014-07-30 NOTE — Telephone Encounter (Signed)
gv adn printed appts ched and avs for pt for DEC °

## 2014-07-30 NOTE — Progress Notes (Signed)
Hematology and Oncology Follow Up Visit  Jeffrey Trujillo [id number] 1951/01/17 63 y.o.    Principle Diagnosis: 63 year old with the following issues:  1. T1b right kidney cancer diagnosed in 2010. He is S/P rdical nephrectomy on June 27, 2009. The pathological staging was T1b clear cell histology with Fuhrman grade 3/4.  2. Left Kidney mass  Measuring 4.5 x 2 7 x 3.6 irregular enhancing appears to be a new kidney cancer noted in 07/2012. 3. Pancreatic lesion. 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  Current therapy: Votrient  800 mg daily started in December 2013. Treatment held in 11/2012 due to increase LFTs. Votrient was resumed in April 2014 at 400 mg daily.  Interim History: Jeffrey Trujillo presents for a follow up visit by him self. Since his last visit, he continues to do well and tolerate the current dose without any complications. He is eating well and has gained 4 pounds after starting Megace. He is reporting to 3 loose bowel movements a day. He continues to tolerate Votrient without any new symptoms. He continues to work full time without any decline in his ability.He does not report any nausea or vomiting. Does not report any dysphasia or difficulty with swallowing. He denies chest pain, shortness of breath, abdominal pain,  No rashes but continues to have easy bruising.  He continues to work full-time without any difficulty at this time. He has not reported any constitutional symptom. He did not report any headaches blurred vision double vision. Does not report any motor sensory neuropathy or alteration of mental status. He does not report any depression or anxiety. He does not report any frequency urgency or hesitancy. He does not report any other urinary symptoms. He does not report any hematochezia or melena.  Does not report any lymphadenopathy or any constitutional symptoms. Rest of his review of systems is unremarkable.  Medications: Unchanged by my review  today. Current Outpatient Prescriptions  Medication Sig Dispense Refill  . indomethacin (INDOCIN) 50 MG capsule Take 1 capsule (50 mg total) by mouth 2 (two) times daily with a meal.    . pantoprazole (PROTONIX) 40 MG tablet Take 1 tablet (40 mg total) by mouth daily. 30 tablet 1  . topiramate (TOPAMAX) 25 MG tablet Take 25 mg by mouth 2 (two) times daily.    Marland Kitchen VOTRIENT 200 MG tablet TAKE 2 TABLETS (400MG  TOTAL) BY MOUTH DAILY ON AN EMPTY STOMACH 60 tablet 0   No current facility-administered medications for this visit.     Allergies:  Allergies  Allergen Reactions  . Nsaids Other (See Comments)    Patient only has one kidney.   . Meperidine Hcl Hives and Rash    Past Medical History, Surgical history, Social history, and Family History were reviewed and updated.   Physical Exam: Blood pressure 146/71, pulse 66, temperature 98 F (44.45 C), temperature source Oral, resp. rate 18, height 5\' 11"  (1.803 m), weight 176 lb 4 oz (80.01 kg). ECOG: 1 General appearance: alert and awake appeared in no active distress. Head: Normocephalic, without obvious abnormality.  Neck: no adenopathy, no masses Lymph nodes: Cervical, supraclavicular, and axillary nodes normal. Heart:regular rate and rhythm, S1, S2.  Lung:chest clear, no wheezing, or dullness to percussion. Abdomen: soft, non-tender, without masses or organomegaly no shifting dullness or ascites EXT:no erythema, induration, or nodules. No edema. Neurological exam: no motor or sensory deficits noted. Skin: No rashes or lesions noted. Ecchymosis noted on his upper extremities.  CBC  Component Value Date/Time   WBC 3.7* 07/28/2014 0935   WBC 2.9* 07/12/2014 1229   RBC 2.83* 07/28/2014 0935   RBC 2.31* 07/12/2014 1229   HGB 10.5* 07/28/2014 0935   HGB 9.0* 07/12/2014 1229   HCT 31.8* 07/28/2014 0935   HCT 26.1* 07/12/2014 1229   PLT 98* 07/28/2014 0935   PLT 78* 07/12/2014 1229   MCV 112.2* 07/28/2014 0935   MCV 113.0*  07/12/2014 1229   MCH 37.1* 07/28/2014 0935   MCH 39.0* 07/12/2014 1229   MCHC 33.1 07/28/2014 0935   MCHC 34.5 07/12/2014 1229   RDW 19.6* 07/28/2014 0935   RDW 18.5* 07/12/2014 1229   LYMPHSABS 0.5* 07/28/2014 0935   LYMPHSABS 0.3* 07/12/2014 1229   MONOABS 0.3 07/28/2014 0935   MONOABS 0.4 07/12/2014 1229   EOSABS 0.0 07/28/2014 0935   EOSABS 0.0 07/12/2014 1229   BASOSABS 0.0 07/28/2014 0935   BASOSABS 0.0 07/12/2014 1229    EXAM: CT CHEST, ABDOMEN, AND PELVIS WITH CONTRAST  TECHNIQUE: Multidetector CT imaging of the chest, abdomen and pelvis was performed following the standard protocol during bolus administration of intravenous contrast.  CONTRAST: 122mL OMNIPAQUE IOHEXOL 300 MG/ML SOLN  COMPARISON: 03/23/2014.  FINDINGS: CT CHEST FINDINGS  No pathologically enlarged mediastinal, hilar or axillary lymph nodes. Heart size normal. No pericardial effusion.  A probable 4 mm subpleural lymph node along the right major fissure (series 4, image 40) is unchanged. Pleural parenchymal scarring at the base of the left hemi thorax. No pleural fluid. Airway is unremarkable.  CT ABDOMEN AND PELVIS FINDINGS  Hepatobiliary: Liver is decreased in attenuation diffusely. A 6 mm low-attenuation lesion in the dome of the liver is unchanged. Gallbladder is unremarkable. No biliary ductal dilatation.  Pancreas: Negative.  Spleen: Negative.  Adrenals/Urinary Tract: Adrenal glands are unremarkable. Right nephrectomy. Hepatic steatosis. A cyst is seen off the upper pole left kidney. A hyper attenuating and slightly heterogeneous mass in the lateral aspect of the upper pole left kidney measures 3.0 x 3.3 cm, stable. Ureters are decompressed. Bladder is low in volume.  Stomach/Bowel: Stomach, small bowel and colon are unremarkable. Appendix is not readily visualized.  Vascular/Lymphatic: Vascular structures are grossly unremarkable. No pathologically enlarged  lymph nodes.  Reproductive: Prostate is normal in size.  Other: No free fluid. Mesenteries and peritoneum are unremarkable.  Musculoskeletal: No worrisome lytic or sclerotic lesions. Posttraumatic or postsurgical changes involving the left sixth rib.  IMPRESSION: 1. Stable left renal mass, consistent with renal cell carcinoma. No evidence of metastatic disease. 2. Right nephrectomy. 3. Fatty liver.   Impression and Plan:   A 63 year old gentleman with the following issues:   1. A history of renal cell carcinoma. He had stage T1b diagnosed in October 2010 of the right kidney, status post right nephrectomy. He also has left kidney cancer and pancreatic metastasis. He is currently on Votrient without any new complications. CT scan from 07/28/2014 was reviewed today and continue to show stable disease. His left renal mass is unchanged without any other measurable disease. The plan is to continue with the current dose and regimen and repeat scan in 4 months.  2. Pancreatic lesion. Biopsy proven to be malignant and likely represent renal cell cancer. Regressed on Votrient at his last CT scan did not show any evidence of that at this time.  3. 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  4. Anemia. Hgb is stable. No active bleeding. No transfusion is indicated.   5. Increase LFTs:  back to normal now. Will continue to follow this.   6. Weight loss: this is improved at this time with Megace and he continued to gain weight since the last visit.  7. Follow-up. In 4 to 5 weeks.   [name] MD 07/30/2014

## 2014-08-05 ENCOUNTER — Other Ambulatory Visit: Payer: Self-pay | Admitting: Oncology

## 2014-08-15 ENCOUNTER — Other Ambulatory Visit: Payer: Self-pay | Admitting: Oncology

## 2014-08-23 ENCOUNTER — Ambulatory Visit: Payer: PRIVATE HEALTH INSURANCE | Admitting: Gastroenterology

## 2014-08-27 ENCOUNTER — Ambulatory Visit (HOSPITAL_BASED_OUTPATIENT_CLINIC_OR_DEPARTMENT_OTHER): Payer: 59 | Admitting: Physician Assistant

## 2014-08-27 ENCOUNTER — Other Ambulatory Visit (HOSPITAL_BASED_OUTPATIENT_CLINIC_OR_DEPARTMENT_OTHER): Payer: 59

## 2014-08-27 ENCOUNTER — Telehealth: Payer: Self-pay | Admitting: Physician Assistant

## 2014-08-27 ENCOUNTER — Encounter: Payer: Self-pay | Admitting: Physician Assistant

## 2014-08-27 VITALS — BP 110/66 | HR 82 | Temp 98.7°F | Resp 18 | Ht 71.0 in | Wt 171.8 lb

## 2014-08-27 DIAGNOSIS — C7989 Secondary malignant neoplasm of other specified sites: Secondary | ICD-10-CM

## 2014-08-27 DIAGNOSIS — C649 Malignant neoplasm of unspecified kidney, except renal pelvis: Secondary | ICD-10-CM

## 2014-08-27 DIAGNOSIS — C641 Malignant neoplasm of right kidney, except renal pelvis: Secondary | ICD-10-CM

## 2014-08-27 DIAGNOSIS — G4700 Insomnia, unspecified: Secondary | ICD-10-CM

## 2014-08-27 DIAGNOSIS — R630 Anorexia: Secondary | ICD-10-CM

## 2014-08-27 DIAGNOSIS — D649 Anemia, unspecified: Secondary | ICD-10-CM

## 2014-08-27 DIAGNOSIS — D6959 Other secondary thrombocytopenia: Secondary | ICD-10-CM

## 2014-08-27 LAB — CBC WITH DIFFERENTIAL/PLATELET
BASO%: 0.6 % (ref 0.0–2.0)
BASOS ABS: 0 10*3/uL (ref 0.0–0.1)
EOS%: 1.5 % (ref 0.0–7.0)
Eosinophils Absolute: 0.1 10*3/uL (ref 0.0–0.5)
HCT: 29.5 % — ABNORMAL LOW (ref 38.4–49.9)
HEMOGLOBIN: 9.9 g/dL — AB (ref 13.0–17.1)
LYMPH%: 13.1 % — ABNORMAL LOW (ref 14.0–49.0)
MCH: 38 pg — AB (ref 27.2–33.4)
MCHC: 33.6 g/dL (ref 32.0–36.0)
MCV: 113.1 fL — ABNORMAL HIGH (ref 79.3–98.0)
MONO#: 0.4 10*3/uL (ref 0.1–0.9)
MONO%: 12.1 % (ref 0.0–14.0)
NEUT#: 2.6 10*3/uL (ref 1.5–6.5)
NEUT%: 72.7 % (ref 39.0–75.0)
PLATELETS: 164 10*3/uL (ref 140–400)
RBC: 2.61 10*6/uL — ABNORMAL LOW (ref 4.20–5.82)
RDW: 18.1 % — AB (ref 11.0–14.6)
WBC: 3.6 10*3/uL — ABNORMAL LOW (ref 4.0–10.3)
lymph#: 0.5 10*3/uL — ABNORMAL LOW (ref 0.9–3.3)

## 2014-08-27 LAB — COMPREHENSIVE METABOLIC PANEL (CC13)
ALBUMIN: 3 g/dL — AB (ref 3.5–5.0)
ALK PHOS: 165 U/L — AB (ref 40–150)
ALT: 40 U/L (ref 0–55)
AST: 26 U/L (ref 5–34)
Anion Gap: 11 mEq/L (ref 3–11)
BUN: 10.8 mg/dL (ref 7.0–26.0)
CO2: 22 mEq/L (ref 22–29)
CREATININE: 1.2 mg/dL (ref 0.7–1.3)
Calcium: 8.6 mg/dL (ref 8.4–10.4)
Chloride: 110 mEq/L — ABNORMAL HIGH (ref 98–109)
EGFR: 65 mL/min/{1.73_m2} — AB (ref >90)
GLUCOSE: 88 mg/dL (ref 70–140)
Potassium: 4 mEq/L (ref 3.5–5.1)
Sodium: 143 mEq/L (ref 136–145)
Total Bilirubin: 1 mg/dL (ref 0.20–1.20)
Total Protein: 6.6 g/dL (ref 6.4–8.3)

## 2014-08-27 MED ORDER — MEGESTROL ACETATE 400 MG/10ML PO SUSP: 400.0000 mg | Freq: Two times a day (BID) | ORAL | Status: DC

## 2014-08-27 NOTE — Telephone Encounter (Signed)
Gave avs & cal for Jan. °

## 2014-08-27 NOTE — Progress Notes (Signed)
Hematology and Oncology Follow Up Visit  Jeffrey Trujillo [id number] [birth year] 63 y.o.    Principle Diagnosis: 63 year old with the following issues:  1. T1b right kidney cancer diagnosed in 2010. He is S/P rdical nephrectomy on June 27, 2009. The pathological staging was T1b clear cell histology with Fuhrman grade 3/4.  2. Left Kidney mass  Measuring 4.5 x 2 7 x 3.6 irregular enhancing appears to be a new kidney cancer noted in 07/2012. 3. Pancreatic lesion. 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  Current therapy: Votrient  800 mg daily started in December 2013. Treatment held in 11/2012 due to increase LFTs. Votrient was resumed in April 2014 at 400 mg daily.  Interim History: Mr. Vanderwerf presents for a follow up visit by him self. Since his last visit, he continues to do well and tolerate the current dose without any complications. He does report is not eating well because he has no appetite. He was prescribed an appetite stimulant (Megace) this summer and would like to give it a try again.  He continues to tolerate Votrient without any new symptoms. He continues to work full time without any decline in his ability.He requests a note for work verifying his appointment today. He does not report any nausea or vomiting. Does not report any dysphasia or difficulty with swallowing. He denies chest pain, shortness of breath, abdominal pain,  No rashes but continues to have easy bruising.  He continues to work full-time without any difficulty at this time. He has not reported any constitutional symptom. He did not report any headaches blurred vision double vision. Does not report any motor sensory neuropathy or alteration of mental status. He does not report any depression or anxiety. He does not report any frequency urgency or hesitancy. He does not report any other urinary symptoms. He does not report any hematochezia or melena.  Does not report any lymphadenopathy or any  constitutional symptoms. He does report some difficulty sleeping. Remainder of his review of systems is unremarkable.  Medications: Unchanged by my review today. Current Outpatient Prescriptions  Medication Sig Dispense Refill  . indomethacin (INDOCIN) 50 MG capsule Take 1 capsule (50 mg total) by mouth 2 (two) times daily with a meal.    . pantoprazole (PROTONIX) 40 MG tablet Take 1 tablet (40 mg total) by mouth daily. 30 tablet 1  . topiramate (TOPAMAX) 25 MG tablet Take 25 mg by mouth 2 (two) times daily.    Marland Kitchen VOTRIENT 200 MG tablet TAKE 2 TABLETS (400MG  TOTAL) BY MOUTH DAILY ON AN EMPTY STOMACH 60 tablet 0  . VOTRIENT 200 MG tablet TAKE 2 TABLETS (400MG  TOTAL) BY MOUTH DAILY ON AN EMPTY STOMACH 60 tablet 0  . megestrol (MEGACE) 400 MG/10ML suspension Take 10 mLs (400 mg total) by mouth 2 (two) times daily. 240 mL 0   No current facility-administered medications for this visit.     Allergies:  Allergies  Allergen Reactions  . Nsaids Other (See Comments)    Patient only has one kidney.   . Meperidine Hcl Hives and Rash    Past Medical History, Surgical history, Social history, and Family History were reviewed and updated.   Physical Exam: Blood pressure 110/66, pulse 82, temperature 98.7 F (37.1 C), temperature source Oral, resp. rate 18, height 5\' 11"  (1.803 m), weight 171 lb 12.8 oz (77.928 kg). ECOG: 1 General appearance: alert and awake appeared in no active distress. Head: Normocephalic, without obvious abnormality.  Neck: no  adenopathy, no masses Lymph nodes: Cervical, supraclavicular, and axillary nodes normal. Heart:regular rate and rhythm, S1, S2.  Lung:chest clear, no wheezing, or dullness to percussion. Abdomen: soft, non-tender, without masses or organomegaly no shifting dullness or ascites EXT:no erythema, induration, or nodules. No edema. Neurological exam: no motor or sensory deficits noted. Skin: No rashes or lesions noted. Ecchymosis noted on his upper  extremities.  CBC    Component Value Date/Time   WBC 3.6* 08/27/2014 1308   WBC 2.9* 07/12/2014 1229   RBC 2.61* 08/27/2014 1308   RBC 2.31* 07/12/2014 1229   HGB 9.9* 08/27/2014 1308   HGB 9.0* 07/12/2014 1229   HCT 29.5* 08/27/2014 1308   HCT 26.1* 07/12/2014 1229   PLT 164 08/27/2014 1308   PLT 78* 07/12/2014 1229   MCV 113.1* 08/27/2014 1308   MCV 113.0* 07/12/2014 1229   MCH 38.0* 08/27/2014 1308   MCH 39.0* 07/12/2014 1229   MCHC 33.6 08/27/2014 1308   MCHC 34.5 07/12/2014 1229   RDW 18.1* 08/27/2014 1308   RDW 18.5* 07/12/2014 1229   LYMPHSABS 0.5* 08/27/2014 1308   LYMPHSABS 0.3* 07/12/2014 1229   MONOABS 0.4 08/27/2014 1308   MONOABS 0.4 07/12/2014 1229   EOSABS 0.1 08/27/2014 1308   EOSABS 0.0 07/12/2014 1229   BASOSABS 0.0 08/27/2014 1308   BASOSABS 0.0 07/12/2014 1229    EXAM: CT CHEST, ABDOMEN, AND PELVIS WITH CONTRAST  TECHNIQUE: Multidetector CT imaging of the chest, abdomen and pelvis was performed following the standard protocol during bolus administration of intravenous contrast.  CONTRAST: 171mL OMNIPAQUE IOHEXOL 300 MG/ML SOLN  COMPARISON: 03/23/2014.  FINDINGS: CT CHEST FINDINGS  No pathologically enlarged mediastinal, hilar or axillary lymph nodes. Heart size normal. No pericardial effusion.  A probable 4 mm subpleural lymph node along the right major fissure (series 4, image 40) is unchanged. Pleural parenchymal scarring at the base of the left hemi thorax. No pleural fluid. Airway is unremarkable.  CT ABDOMEN AND PELVIS FINDINGS  Hepatobiliary: Liver is decreased in attenuation diffusely. A 6 mm low-attenuation lesion in the dome of the liver is unchanged. Gallbladder is unremarkable. No biliary ductal dilatation.  Pancreas: Negative.  Spleen: Negative.  Adrenals/Urinary Tract: Adrenal glands are unremarkable. Right nephrectomy. Hepatic steatosis. A cyst is seen off the upper pole left kidney. A hyper  attenuating and slightly heterogeneous mass in the lateral aspect of the upper pole left kidney measures 3.0 x 3.3 cm, stable. Ureters are decompressed. Bladder is low in volume.  Stomach/Bowel: Stomach, small bowel and colon are unremarkable. Appendix is not readily visualized.  Vascular/Lymphatic: Vascular structures are grossly unremarkable. No pathologically enlarged lymph nodes.  Reproductive: Prostate is normal in size.  Other: No free fluid. Mesenteries and peritoneum are unremarkable.  Musculoskeletal: No worrisome lytic or sclerotic lesions. Posttraumatic or postsurgical changes involving the left sixth rib.  IMPRESSION: 1. Stable left renal mass, consistent with renal cell carcinoma. No evidence of metastatic disease. 2. Right nephrectomy. 3. Fatty liver.   Impression and Plan:   A 63 year old gentleman with the following issues:   1. A history of renal cell carcinoma. He had stage T1b diagnosed in October 2010 of the right kidney, status post right nephrectomy. He also has left kidney cancer and pancreatic metastasis. He is currently on Votrient without any new complications. CT scan from 07/28/2014 was reviewed today and continue to show stable disease. His left renal mass is unchanged without any other measurable disease. The plan is to continue with the current dose and regimen  and repeat scan in 3 months.  2. Pancreatic lesion. Biopsy proven to be malignant and likely represent renal cell cancer. Regressed on Votrient at his last CT scan did not show any evidence of that at this time.  3. Thrombocytopenia: This is related to Votrient and has been stable recently.  4. Anemia. Hgb is stable. No active bleeding. No transfusion is indicated.   5. Increase LFTs: back to normal now. Will continue to follow this.   6. Weight loss: weight is stable but appetite is poor. Will prescribe Megace again and follow closely.  7. Insomnia: patient may try Benadryl at  bedtime to help him sleep. If this does not work we will consider other options at his follow up appointment  8. Follow-up. In 4 to 5 weeks.  Patient given a note verifying his appointment with [place] today.  Carlton Adam PA-C 08/27/2014

## 2014-08-27 NOTE — Patient Instructions (Signed)
Continue Votrient 400 mg by mouth daily Follow up in 1 month

## 2014-09-20 ENCOUNTER — Other Ambulatory Visit: Payer: Self-pay | Admitting: *Deleted

## 2014-09-20 DIAGNOSIS — C649 Malignant neoplasm of unspecified kidney, except renal pelvis: Secondary | ICD-10-CM

## 2014-09-20 MED ORDER — PAZOPANIB HCL 200 MG PO TABS: ORAL_TABLET | ORAL | Status: DC

## 2014-09-20 NOTE — Telephone Encounter (Signed)
Wife called reporting the prior authorization was not received for votrient.  Call [phone] for prior authorization.  [email or web address] can also be used.  Will notify [employer].

## 2014-09-20 NOTE — Telephone Encounter (Signed)
Wife renald haithcock called requesting Votrient refill for Jeffrey Trujillo.  "[employer] and no longer uses [employer]."  Reports "he has 4-day supply and an urgent refill can be called to [facility] at [phone] and prior authorization can be called to [phone]". Called [employer] while submitting this eRx.

## 2014-09-21 ENCOUNTER — Encounter: Payer: Self-pay | Admitting: Oncology

## 2014-09-21 NOTE — Telephone Encounter (Signed)
RECEIVED A FAX FROM [employer] CONCERNING A PRIOR AUTHORIZATION FOR VOTRIENT. THIS REQUEST WAS PLACED IN THE MANAGED CARE BIN.

## 2014-09-21 NOTE — Progress Notes (Signed)
Faxed votrient pa form to [facility]

## 2014-09-22 ENCOUNTER — Encounter: Payer: Self-pay | Admitting: Oncology

## 2014-09-22 ENCOUNTER — Encounter: Payer: Self-pay | Admitting: *Deleted

## 2014-09-23 ENCOUNTER — Telehealth: Payer: Self-pay | Admitting: Internal Medicine

## 2014-09-23 NOTE — Telephone Encounter (Signed)
Pt's wife called asking to speak with nurse or if patient could be seen today for rectal bleeding. I told her that we do not have any openings right now and I could get a message to the nurse to see what recommendations RMR may make for patient.   She said that the patient has gout and the medicine he takes causes bleeding and he started passing blood in his stool yesterday. Please advise and call [phone]

## 2014-09-23 NOTE — Telephone Encounter (Signed)
Pt's wife is aware of OV for 0830 tomorrow.

## 2014-09-23 NOTE — Telephone Encounter (Signed)
Spoke with the pts wife- the blood is bright red and he just started seeing it yesterday. She is aware that if his symptoms worsen overnight he needs to go to the ED.

## 2014-09-24 ENCOUNTER — Other Ambulatory Visit: Payer: Self-pay

## 2014-09-24 ENCOUNTER — Encounter: Payer: Self-pay | Admitting: Gastroenterology

## 2014-09-24 ENCOUNTER — Ambulatory Visit (INDEPENDENT_AMBULATORY_CARE_PROVIDER_SITE_OTHER): Payer: 59 | Admitting: Gastroenterology

## 2014-09-24 VITALS — BP 121/72 | HR 70 | Temp 97.2°F | Ht 71.0 in | Wt 166.2 lb

## 2014-09-24 DIAGNOSIS — K2270 Barrett's esophagus without dysplasia: Secondary | ICD-10-CM

## 2014-09-24 DIAGNOSIS — K279 Peptic ulcer, site unspecified, unspecified as acute or chronic, without hemorrhage or perforation: Secondary | ICD-10-CM

## 2014-09-24 DIAGNOSIS — M10072 Idiopathic gout, left ankle and foot: Secondary | ICD-10-CM

## 2014-09-24 DIAGNOSIS — K625 Hemorrhage of anus and rectum: Secondary | ICD-10-CM

## 2014-09-24 MED ORDER — HYDROCORTISONE ACETATE 25 MG RE SUPP: 25.0000 mg | Freq: Two times a day (BID) | RECTAL | Status: DC

## 2014-09-24 MED ORDER — PREDNISONE 10 MG PO TABS: ORAL_TABLET | ORAL | Status: DC

## 2014-09-24 NOTE — Assessment & Plan Note (Signed)
Suspect benign anorectal source in this setting. Developed some irritation related to recent diarrheal illness. Benign rectal exam. He was seen positive but this is not surprising given rectal bleeding as late as yesterday. He had a normal bowel movement today. At this point would monitor symptoms. His colonoscopy is up-to-date, he does have a history of diverticular bleed in the past but describes current bleeding is small volume. He was advised if he has recurrent significant bleeding over the weekend he should go to the emergency department. Anusol HC Suppository twice a day for 12 days.

## 2014-09-24 NOTE — Progress Notes (Signed)
Primary Care Physician:  PROVIDER NOT [facility]  Primary Gastroenterologist:  Garfield Cornea, MD   Chief Complaint  Patient presents with  . Rectal Bleeding    HPI:  Jeffrey Trujillo is a 64 y.o. male here for further evaluation of rectal bleeding. Recently had some diarrhea. "Bottom got irritated". Started having some small volume bright red blood per rectum per [place] [facility] on the tissue which began couple of days ago. He had about 4 episodes. Normal bowel movement this morning. No associated melena. Diarrhea R knee resolved. Denies abdominal pain, vomiting, dysphagia, heartburn, hematuria. Complains of acute onset gout in his right big toe which started 2 days ago. He held the Votrient yesterday.  GI history: -June 2014, hospitalization for diverticulitis with small perforation/abscess also had black watery stools. EGD showed known Barrett's esophagus not biopsied), single ulcer in the gastric antrum, chronic gastritis in the antrum. He was on Votrient, ETOH, NSAIDs and no PPI at the time.  -Ileocolonoscopy January 2014 suspected diverticular bleed at the hepatic flexure, sealed with resolution clips -EGD January 2014 Barrett's esophagus, antral erosions no biopsy from Barrett's.   Current Outpatient Prescriptions  Medication Sig Dispense Refill  . indomethacin (INDOCIN) 50 MG capsule Take 1 capsule (50 mg total) by mouth 2 (two) times daily with a meal.    . megestrol (MEGACE) 400 MG/10ML suspension Take 10 mLs (400 mg total) by mouth 2 (two) times daily. 240 mL 0  . pantoprazole (PROTONIX) 40 MG tablet Take 1 tablet (40 mg total) by mouth daily. 30 tablet 1  . pazopanib (VOTRIENT) 200 MG tablet TAKE 2 TABLETS (400MG  TOTAL) BY MOUTH DAILY ON AN EMPTY STOMACH 60 tablet 0  . topiramate (TOPAMAX) 25 MG tablet Take 25 mg by mouth 2 (two) times daily.     No current facility-administered medications for this visit.    Allergies as of 09/24/2014 - Review Complete 09/24/2014  Allergen Reaction  Noted  . Nsaids Other (See Comments) 06/05/2011  . Demerol [meperidine] Rash 01/11/2014  . Meperidine hcl Hives and Rash     Past Medical History  Diagnosis Date  . Gout   . DJD (degenerative joint disease) of cervical spine   . Diverticulosis of colon     by colonoscopy 11/02/2009  . Barrett's esophagus     by EGD 11/02/2009  . Anemia   . Cancer     "was told has cancer on other kidney and spot on pancreas"  . Renal cell cancer     with mets to pancreas    Past Surgical History  Procedure Laterality Date  . Nephrectomy      right due to cancer  . Lung removal, partial      left lower lobe, benign  . Eus  08/07/2012    Procedure: UPPER ENDOSCOPIC ULTRASOUND (EUS) LINEAR;  Surgeon: [name], MD;  Location: [facility];  Service: Endoscopy;  Laterality: N/A;  . Inguinal hernia repair    . Hernia repair    . Inguinal hernia repair      Right  . Eus  08/21/2012    Procedure: UPPER ENDOSCOPIC ULTRASOUND (EUS) LINEAR;  Surgeon: [name], MD;  Location: [facility];  Service: Endoscopy;  Laterality: N/A;  . Colonoscopy  Feb 2011    [name]: single anal papilla, pan-colonic diverticula  . Esophagogastroduodenoscopy  Feb 2011    [name]: salmon-colored epithelium consistent with Barrett's, nodular antral erosions, small hiatal hernia, negative H.pylori  . Colonoscopy  09/25/2012  RMR: Colonic diverticulosis. Suspect diverticular bleeding-hepatic flexure tic-sealed  . Esophagogastroduodenoscopy  09/25/2012    ZOX:WRUEAVW'U esophagus. Small hiatal hernia. Antral erosions of doubtful clinical significance, no biopsies  . Esophagogastroduodenoscopy N/A 03/11/2013    SLF: Barrett's esophagus/Single ulcer in the gastric antrum-PROBABLE SOURCE FOR BLACK STOOL/ Chronic gastritis in the gastric antrum    Family History  Problem Relation Age of Onset  . Lung cancer Brother   . Prostate cancer Brother   . Colon cancer Neg Hx   . Lumbar disc disease Brother   . Prostate  cancer Brother     History   Social History  . Marital Status: Married    Spouse Name: N/A    Number of Children: N/A  . Years of Education: N/A   Occupational History  .      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 Smoking status: Former Smoker    Types: Cigars  . Smokeless tobacco: Never Used     Comment: occasional cigar  . Alcohol Use: Yes     Comment: vodka and budweiser, sometimes daily, depends on notion. couple drinks  . Drug Use: No  . Sexual Activity: Yes   Other Topics Concern  . Not on file   Social History Narrative      ROS:  General: Negative for anorexia, weight loss, fever, chills, fatigue, weakness. Eyes: Negative for vision changes.  ENT: Negative for hoarseness, difficulty swallowing , nasal congestion. CV: Negative for chest pain, angina, palpitations, dyspnea on exertion, peripheral edema.  Respiratory: Negative for dyspnea at rest, dyspnea on exertion, cough, sputum, wheezing.  GI: See history of present illness. GU:  Negative for dysuria, hematuria, urinary incontinence, urinary frequency, nocturnal urination.  MS: Negative for joint pain, low back pain.  Derm: Negative for rash or itching.  Neuro: Negative for weakness, abnormal sensation, seizure, frequent headaches, memory loss, confusion.  Psych: Negative for anxiety, depression, suicidal ideation, hallucinations.  Endo: Negative for unusual weight change.  Heme: Negative for bruising or bleeding. Allergy: Negative for rash or hives.    Physical Examination:  BP 121/72 mmHg  Pulse 70  Temp(Src) 97.2 F (36.2 C)  Ht 5\' 11"  (1.803 m)  Wt 166 lb 3.2 oz (75.388 kg)  BMI 23.19 kg/m2   General: Well-nourished, well-developed in no acute distress.  Head: Normocephalic, atraumatic.   Eyes: Conjunctiva pink, no icterus. Mouth: Oropharyngeal mucosa moist and pink , no lesions erythema or exudate. Neck: Supple without thyromegaly, masses, or lymphadenopathy.  Lungs:  Clear to auscultation bilaterally.  Heart: Regular rate and rhythm, no murmurs rubs or gallops.  Abdomen: Bowel sounds are normal, nontender, nondistended, no hepatosplenomegaly or masses, no abdominal bruits or    hernia , no rebound or guarding.   Rectal: No external lesions. Soft brown stool heme positive. Nontender exam. No masses felt. Extremities: No lower extremity edema. No clubbing. Right first metatarsal erythematous swollen warm consistent with gout  Neuro: Alert and oriented x 4 , grossly normal neurologically.  Skin: Warm and dry, no rash or jaundice.   Psych: Alert and cooperative, normal mood and affect.  Labs: Lab Results  Component Value Date   WBC 3.6* 08/27/2014   HGB 9.9* 08/27/2014   HCT 29.5* 08/27/2014   MCV 113.1* 08/27/2014   PLT 164 08/27/2014   Lab Results  Component Value Date   CREATININE 1.2 08/27/2014   BUN 10.8 08/27/2014   NA 143 08/27/2014   K 4.0 08/27/2014   CL 103  07/12/2014   CO2 22 08/27/2014   Lab Results  Component Value Date   ALT 40 08/27/2014   AST 26 08/27/2014   ALKPHOS 165* 08/27/2014   BILITOT 1.00 08/27/2014     Imaging Studies: No results found.

## 2014-09-24 NOTE — Assessment & Plan Note (Signed)
Recommend repeat EGD in the near future as he is due for biopsies for history of Barrett's esophagus. He also never had his follow-up EGD to verify ulcer healing last year. Augment conscious sedation with 12.5 mg of IV Phenergan 30 minutes for the procedure. Patient is allergic to Demerol.  I have discussed the risks, alternatives, benefits with regards to but not limited to the risk of reaction to medication, bleeding, infection, perforation and the patient is agreeable to proceed. Written consent to be obtained.

## 2014-09-24 NOTE — Progress Notes (Addendum)
Please call [name] office (oncology) to find out if we need to hold Votrient prior to EGD with biopsies.

## 2014-09-24 NOTE — Patient Instructions (Signed)
1. Prednisone taper for 10 days for gout. RX sent to your pharmacy. 2. Upper endoscopy to evaluate h/o Barrett's and ulcer in the next 3-4 weeks.  3. Monitor for further rectal bleeding. If you see recurrent bleeding or large amount of blood, go to the ER. 4. Anusol HC suppository to the rectum twice a day for 12 days.

## 2014-09-24 NOTE — Assessment & Plan Note (Signed)
Acute onset gout of the right metatarsal, 2 days duration. Avoid NSAID use. Previously has done well with prednisone, PCP unavailable today. Start prednisone taper over 10 days. Follow-up with PCP.

## 2014-09-27 ENCOUNTER — Telehealth: Payer: Self-pay

## 2014-09-27 NOTE — Progress Notes (Signed)
Message sent to [employer] PA

## 2014-09-27 NOTE — Telephone Encounter (Signed)
error 

## 2014-09-28 ENCOUNTER — Telehealth: Payer: Self-pay

## 2014-09-28 NOTE — Telephone Encounter (Signed)
Routing to LSL for review.

## 2014-09-28 NOTE — Progress Notes (Signed)
No pcp per patient 

## 2014-09-28 NOTE — Telephone Encounter (Signed)
-----   Message from Carlton Adam, PA-C sent at 09/28/2014  1:57 PM EST ----- After discussion with [name]- there is no need for Mr. Coward to hold his Votrient prior to his EGD with biopsies. His CBC in December was adequate but your office might want to check another one prior to the procedure to be sure his platelets are in an acceptable range for your practice.  Awilda Metro, PA-C  ----- Message -----    From: Marylou Mccoy, LPN    Sent: 12/26/3011   9:54 AM      To: Carlton Adam, PA-C  Hi, Our PA Neil Crouch saw this patient in our office last week and he is scheduled for an EGD with biopsies,  on 10/11/14. Magda Paganini would like to know if he needs to hold his Votrient prior to his procedure?  Please advise. Thank you!

## 2014-09-28 NOTE — Progress Notes (Signed)
Please see phone note regarding holding medication.

## 2014-09-30 NOTE — Telephone Encounter (Signed)
Noted. Per Almyra Free, patient has appointment this week with [name] and scheduled for blood work.  Can follow up CBC at that time.   Almyra Free, please follow up on CBC while I'm out. Patient's procedure is 10/12/14.

## 2014-10-01 ENCOUNTER — Other Ambulatory Visit (HOSPITAL_BASED_OUTPATIENT_CLINIC_OR_DEPARTMENT_OTHER): Payer: 59

## 2014-10-01 ENCOUNTER — Encounter: Payer: Self-pay | Admitting: Physician Assistant

## 2014-10-01 ENCOUNTER — Ambulatory Visit (HOSPITAL_BASED_OUTPATIENT_CLINIC_OR_DEPARTMENT_OTHER): Payer: 59 | Admitting: Physician Assistant

## 2014-10-01 ENCOUNTER — Telehealth: Payer: Self-pay | Admitting: Physician Assistant

## 2014-10-01 VITALS — BP 130/67 | HR 67 | Temp 98.0°F | Resp 18 | Ht 71.0 in | Wt 172.3 lb

## 2014-10-01 DIAGNOSIS — R109 Unspecified abdominal pain: Secondary | ICD-10-CM

## 2014-10-01 DIAGNOSIS — C641 Malignant neoplasm of right kidney, except renal pelvis: Secondary | ICD-10-CM

## 2014-10-01 DIAGNOSIS — C7989 Secondary malignant neoplasm of other specified sites: Secondary | ICD-10-CM

## 2014-10-01 LAB — COMPREHENSIVE METABOLIC PANEL (CC13)
ALT: 35 U/L (ref 0–55)
AST: 22 U/L (ref 5–34)
Albumin: 3.1 g/dL — ABNORMAL LOW (ref 3.5–5.0)
Alkaline Phosphatase: 147 U/L (ref 40–150)
Anion Gap: 10 mEq/L (ref 3–11)
BUN: 23.1 mg/dL (ref 7.0–26.0)
CHLORIDE: 108 meq/L (ref 98–109)
CO2: 26 meq/L (ref 22–29)
CREATININE: 1.3 mg/dL (ref 0.7–1.3)
Calcium: 8.7 mg/dL (ref 8.4–10.4)
EGFR: 61 mL/min/{1.73_m2} — ABNORMAL LOW (ref >90)
GLUCOSE: 82 mg/dL (ref 70–140)
Potassium: 3.5 mEq/L (ref 3.5–5.1)
SODIUM: 143 meq/L (ref 136–145)
Total Bilirubin: 0.8 mg/dL (ref 0.20–1.20)
Total Protein: 6.5 g/dL (ref 6.4–8.3)

## 2014-10-01 LAB — CBC WITH DIFFERENTIAL/PLATELET
BASO%: 0 % (ref 0.0–2.0)
Basophils Absolute: 0 10*3/uL (ref 0.0–0.1)
EOS ABS: 0.1 10*3/uL (ref 0.0–0.5)
EOS%: 0.9 % (ref 0.0–7.0)
HEMATOCRIT: 32.3 % — AB (ref 38.4–49.9)
HGB: 10.4 g/dL — ABNORMAL LOW (ref 13.0–17.1)
LYMPH%: 6.6 % — AB (ref 14.0–49.0)
MCH: 36.4 pg — AB (ref 27.2–33.4)
MCHC: 32.2 g/dL (ref 32.0–36.0)
MCV: 112.9 fL — AB (ref 79.3–98.0)
MONO#: 0.6 10*3/uL (ref 0.1–0.9)
MONO%: 7.9 % (ref 0.0–14.0)
NEUT#: 5.9 10*3/uL (ref 1.5–6.5)
NEUT%: 84.6 % — AB (ref 39.0–75.0)
Platelets: 249 10*3/uL (ref 140–400)
RBC: 2.86 10*6/uL — ABNORMAL LOW (ref 4.20–5.82)
RDW: 15.7 % — AB (ref 11.0–14.6)
WBC: 7 10*3/uL (ref 4.0–10.3)
lymph#: 0.5 10*3/uL — ABNORMAL LOW (ref 0.9–3.3)

## 2014-10-01 NOTE — Patient Instructions (Signed)
Continue Votrient at the current dose Keep your scheduled appointments with gastroenterology for evaluation of your GI bleeding Follow-up in one month

## 2014-10-01 NOTE — Progress Notes (Signed)
Hematology and Oncology Follow Up Visit  Jeffrey Trujillo [id number] October 17, 1950 64 y.o.    Principle Diagnosis: 64 year old with the following issues:  1. T1b right kidney cancer diagnosed in 2010. He is S/P rdical nephrectomy on June 27, 2009. The pathological staging was T1b clear cell histology with Fuhrman grade 3/4.  2. Left Kidney mass  Measuring 4.5 x 2 7 x 3.6 irregular enhancing appears to be a new kidney cancer noted in 07/2012. 3. Pancreatic lesion. 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  Current therapy: Votrient  800 mg daily started in December 2013. Treatment held in 11/2012 due to increase LFTs. Votrient was resumed in April 2014 at 400 mg daily.  Interim History: Jeffrey Trujillo presents for a follow up visit by him self. Since his last visit, he continues to do well and tolerate the current dose without any complications. He reports he had some blood per rectum and is being worked up by his gastroenterologist. He has an EGD planned for 10/11/2014 to further evaluate this issue. He states that by the time he saw a gastroenterologist on 09/24/2014 the right red blood per rectum had discontinued however he was positive for occult blood. He is also currently having right toe pain thought to be related to gout. He was placed on a course of prednisone by his gastroenterologist to deal with this. Since he's been on the prednisone he's had some issues with diarrhea/frequent bowel movements. He also complains of some left flank pain that is been present for about 2 weeks. He feels this might be muscular in nature as if he perhaps did something while at work. He is currently not taking any type of pain medication for this issue. He denies having any blood in his urine.  He continues to tolerate Votrient without any new symptoms. He continues to work full time without any decline in his ability. He requests a note for work verifying his appointment today. He does not report any  nausea or vomiting. Does not report any dysphasia or difficulty with swallowing. He denies chest pain, shortness of breath, abdominal pain,  No rashes but continues to have easy bruising.  He continues to work full-time without any difficulty at this time. He has not reported any constitutional symptom. He did not report any headaches blurred vision double vision. Does not report any motor sensory neuropathy or alteration of mental status. He does not report any depression or anxiety. He does not report any frequency urgency or hesitancy. He does not report any other urinary symptoms.   Does not report any lymphadenopathy or any constitutional symptoms. He does report some difficulty sleeping. Remainder of his review of systems is unremarkable.  Medications: Unchanged by my review today. Current Outpatient Prescriptions  Medication Sig Dispense Refill  . hydrocortisone (ANUSOL-HC) 25 MG suppository Place 1 suppository (25 mg total) rectally 2 (two) times daily. 12 suppository 0  . indomethacin (INDOCIN) 50 MG capsule Take 1 capsule (50 mg total) by mouth 2 (two) times daily with a meal.    . megestrol (MEGACE) 400 MG/10ML suspension Take 10 mLs (400 mg total) by mouth 2 (two) times daily. 240 mL 0  . pantoprazole (PROTONIX) 40 MG tablet Take 1 tablet (40 mg total) by mouth daily. 30 tablet 1  . pazopanib (VOTRIENT) 200 MG tablet TAKE 2 TABLETS (400MG  TOTAL) BY MOUTH DAILY ON AN EMPTY STOMACH 60 tablet 0  . predniSONE (DELTASONE) 10 MG tablet Take 40mg  daily for  3 days, then 30mg  daily for 2 days, 20mg  daily for 2 days, 10 mg daily for 2 days, then 5mg  daily for 1 day. 25 tablet 0  . topiramate (TOPAMAX) 25 MG tablet Take 25 mg by mouth 2 (two) times daily.     No current facility-administered medications for this visit.     Allergies:  Allergies  Allergen Reactions  . Nsaids Other (See Comments)    Patient only has one kidney.   . Meperidine Hcl Hives and Rash    Past Medical History,  Surgical history, Social history, and Family History were reviewed and updated.   Physical Exam: Blood pressure 130/67, pulse 67, temperature 98 F (36.7 C), temperature source Oral, resp. rate 18, height 5\' 11"  (1.803 m), weight 172 lb 4.8 oz (78.155 kg), SpO2 100 %. ECOG: 1 General appearance: alert and awake appeared in no active distress. Head: Normocephalic, without obvious abnormality.  Neck: no adenopathy, no masses Lymph nodes: Cervical, supraclavicular, and axillary nodes normal. Heart:regular rate and rhythm, S1, S2.  Lung:chest clear, no wheezing, or dullness to percussion. Abdomen: soft, non-tender, without masses or organomegaly no shifting dullness or ascites EXT:no erythema, induration, or nodules. No edema. Neurological exam: no motor or sensory deficits noted. Skin: No rashes or lesions noted. Ecchymosis noted on his upper extremities. Musculoskeletal: There is mild left flank pain, no rebound or referred pain  CBC    Component Value Date/Time   WBC 7.0 10/01/2014 1321   WBC 2.9* 07/12/2014 1229   RBC 2.86* 10/01/2014 1321   RBC 2.31* 07/12/2014 1229   HGB 10.4* 10/01/2014 1321   HGB 9.0* 07/12/2014 1229   HCT 32.3* 10/01/2014 1321   HCT 26.1* 07/12/2014 1229   PLT 249 10/01/2014 1321   PLT 78* 07/12/2014 1229   MCV 112.9* 10/01/2014 1321   MCV 113.0* 07/12/2014 1229   MCH 36.4* 10/01/2014 1321   MCH 39.0* 07/12/2014 1229   MCHC 32.2 10/01/2014 1321   MCHC 34.5 07/12/2014 1229   RDW 15.7* 10/01/2014 1321   RDW 18.5* 07/12/2014 1229   LYMPHSABS 0.5* 10/01/2014 1321   LYMPHSABS 0.3* 07/12/2014 1229   MONOABS 0.6 10/01/2014 1321   MONOABS 0.4 07/12/2014 1229   EOSABS 0.1 10/01/2014 1321   EOSABS 0.0 07/12/2014 1229   BASOSABS 0.0 10/01/2014 1321   BASOSABS 0.0 07/12/2014 1229    EXAM: CT CHEST, ABDOMEN, AND PELVIS WITH CONTRAST  TECHNIQUE: Multidetector CT imaging of the chest, abdomen and pelvis was performed following the standard protocol  during bolus administration of intravenous contrast.  CONTRAST: 152mL OMNIPAQUE IOHEXOL 300 MG/ML SOLN  COMPARISON: 03/23/2014.  FINDINGS: CT CHEST FINDINGS  No pathologically enlarged mediastinal, hilar or axillary lymph nodes. Heart size normal. No pericardial effusion.  A probable 4 mm subpleural lymph node along the right major fissure (series 4, image 40) is unchanged. Pleural parenchymal scarring at the base of the left hemi thorax. No pleural fluid. Airway is unremarkable.  CT ABDOMEN AND PELVIS FINDINGS  Hepatobiliary: Liver is decreased in attenuation diffusely. A 6 mm low-attenuation lesion in the dome of the liver is unchanged. Gallbladder is unremarkable. No biliary ductal dilatation.  Pancreas: Negative.  Spleen: Negative.  Adrenals/Urinary Tract: Adrenal glands are unremarkable. Right nephrectomy. Hepatic steatosis. A cyst is seen off the upper pole left kidney. A hyper attenuating and slightly heterogeneous mass in the lateral aspect of the upper pole left kidney measures 3.0 x 3.3 cm, stable. Ureters are decompressed. Bladder is low in volume.  Stomach/Bowel: Stomach, small  bowel and colon are unremarkable. Appendix is not readily visualized.  Vascular/Lymphatic: Vascular structures are grossly unremarkable. No pathologically enlarged lymph nodes.  Reproductive: Prostate is normal in size.  Other: No free fluid. Mesenteries and peritoneum are unremarkable.  Musculoskeletal: No worrisome lytic or sclerotic lesions. Posttraumatic or postsurgical changes involving the left sixth rib.  IMPRESSION: 1. Stable left renal mass, consistent with renal cell carcinoma. No evidence of metastatic disease. 2. Right nephrectomy. 3. Fatty liver.   Impression and Plan:   A 64 year old gentleman with the following issues:   1. A history of renal cell carcinoma. He had stage T1b diagnosed in October 2010 of the right kidney, status post right  nephrectomy. He also has left kidney cancer and pancreatic metastasis. He is currently on Votrient without any new complications. CT scan from 07/28/2014 showed stable disease. His left renal mass is unchanged without any other measurable disease. The plan is to continue with the current dose and regimen and repeat scan in 2 months.  2. Pancreatic lesion. Biopsy proven to be malignant and likely represent renal cell cancer. Regressed on Votrient at his last CT scan did not show any evidence of that at this time.  3. Thrombocytopenia: This is related to Votrient and has been improved and stable recently.  4. Anemia. Hgb is stable. No active bleeding. No transfusion is indicated.   5. Increase LFTs: back to normal now. Will continue to follow this.   6. Weight loss: weight is stable but appetite is poor. Continue Megace and follow closely.  7. Insomnia: patient may try Benadryl at bedtime to help him sleep. If this does not work we will consider other options at his follow up appointment  8. Left flank pain this may be muscular in nature, the patient may try Tylenol for relief.  9. Blood per rectum/GM bleeding: Patient to follow through with GI workup for his gastroenterologist including the EGD scheduled for 10/11/2014.  10. Follow-up. In 4 to 5 weeks.  Patient reviewed with [name] who is in agreement with the plan.  Patient given a note verifying his appointment with [place] today.  Carlton Adam PA-C 10/01/2014

## 2014-10-01 NOTE — Telephone Encounter (Signed)
Gave avs & cal for Feb. °

## 2014-10-04 ENCOUNTER — Emergency Department (HOSPITAL_COMMUNITY): Payer: 59

## 2014-10-04 ENCOUNTER — Encounter (HOSPITAL_COMMUNITY): Payer: Self-pay | Admitting: *Deleted

## 2014-10-04 ENCOUNTER — Other Ambulatory Visit: Payer: Self-pay

## 2014-10-04 ENCOUNTER — Emergency Department (HOSPITAL_COMMUNITY)
Admission: EM | Admit: 2014-10-04 | Discharge: 2014-10-04 | Disposition: A | Discharge status: Home / Self Care | Payer: 59 | Attending: Emergency Medicine | Admitting: Emergency Medicine

## 2014-10-04 ENCOUNTER — Telehealth: Payer: Self-pay

## 2014-10-04 DIAGNOSIS — C7889 Secondary malignant neoplasm of other digestive organs: Secondary | ICD-10-CM | POA: Diagnosis not present

## 2014-10-04 DIAGNOSIS — R1084 Generalized abdominal pain: Secondary | ICD-10-CM | POA: Insufficient documentation

## 2014-10-04 DIAGNOSIS — C649 Malignant neoplasm of unspecified kidney, except renal pelvis: Secondary | ICD-10-CM | POA: Diagnosis not present

## 2014-10-04 DIAGNOSIS — Z8719 Personal history of other diseases of the digestive system: Secondary | ICD-10-CM | POA: Insufficient documentation

## 2014-10-04 DIAGNOSIS — H6091 Unspecified otitis externa, right ear: Secondary | ICD-10-CM | POA: Insufficient documentation

## 2014-10-04 DIAGNOSIS — Z79899 Other long term (current) drug therapy: Secondary | ICD-10-CM | POA: Insufficient documentation

## 2014-10-04 DIAGNOSIS — M109 Gout, unspecified: Secondary | ICD-10-CM | POA: Diagnosis not present

## 2014-10-04 DIAGNOSIS — K922 Gastrointestinal hemorrhage, unspecified: Secondary | ICD-10-CM

## 2014-10-04 DIAGNOSIS — Z87891 Personal history of nicotine dependence: Secondary | ICD-10-CM | POA: Insufficient documentation

## 2014-10-04 DIAGNOSIS — Z791 Long term (current) use of non-steroidal anti-inflammatories (NSAID): Secondary | ICD-10-CM | POA: Insufficient documentation

## 2014-10-04 DIAGNOSIS — R197 Diarrhea, unspecified: Secondary | ICD-10-CM | POA: Insufficient documentation

## 2014-10-04 DIAGNOSIS — K5900 Constipation, unspecified: Secondary | ICD-10-CM | POA: Diagnosis not present

## 2014-10-04 DIAGNOSIS — Z862 Personal history of diseases of the blood and blood-forming organs and certain disorders involving the immune mechanism: Secondary | ICD-10-CM | POA: Insufficient documentation

## 2014-10-04 DIAGNOSIS — R1030 Lower abdominal pain, unspecified: Secondary | ICD-10-CM | POA: Diagnosis present

## 2014-10-04 LAB — CBC WITH DIFFERENTIAL/PLATELET
Basophils Absolute: 0 10*3/uL (ref 0.0–0.1)
Basophils Relative: 0 % (ref 0–1)
EOS ABS: 0 10*3/uL (ref 0.0–0.7)
Eosinophils Relative: 1 % (ref 0–5)
HCT: 30.5 % — ABNORMAL LOW (ref 39.0–52.0)
HEMOGLOBIN: 10 g/dL — AB (ref 13.0–17.0)
Lymphocytes Relative: 11 % — ABNORMAL LOW (ref 12–46)
Lymphs Abs: 0.6 10*3/uL — ABNORMAL LOW (ref 0.7–4.0)
MCH: 36.9 pg — ABNORMAL HIGH (ref 26.0–34.0)
MCHC: 32.8 g/dL (ref 30.0–36.0)
MCV: 112.5 fL — AB (ref 78.0–100.0)
MONO ABS: 0.5 10*3/uL (ref 0.1–1.0)
Monocytes Relative: 9 % (ref 3–12)
Neutro Abs: 4.4 10*3/uL (ref 1.7–7.7)
Neutrophils Relative %: 80 % — ABNORMAL HIGH (ref 43–77)
Platelets: 249 10*3/uL (ref 150–400)
RBC: 2.71 MIL/uL — ABNORMAL LOW (ref 4.22–5.81)
RDW: 15.5 % (ref 11.5–15.5)
WBC: 5.6 10*3/uL (ref 4.0–10.5)

## 2014-10-04 LAB — COMPREHENSIVE METABOLIC PANEL
ALT: 20 U/L (ref 0–53)
ANION GAP: 5 (ref 5–15)
AST: 18 U/L (ref 0–37)
Albumin: 3.2 g/dL — ABNORMAL LOW (ref 3.5–5.2)
Alkaline Phosphatase: 115 U/L (ref 39–117)
BILIRUBIN TOTAL: 1.1 mg/dL (ref 0.3–1.2)
BUN: 15 mg/dL (ref 6–23)
CALCIUM: 8.7 mg/dL (ref 8.4–10.5)
CHLORIDE: 108 meq/L (ref 96–112)
CO2: 28 mmol/L (ref 19–32)
Creatinine, Ser: 1.3 mg/dL (ref 0.50–1.35)
GFR calc Af Amer: 66 mL/min — ABNORMAL LOW (ref >90)
GFR, EST NON AFRICAN AMERICAN: 57 mL/min — AB (ref >90)
GLUCOSE: 89 mg/dL (ref 70–99)
Potassium: 3.7 mmol/L (ref 3.5–5.1)
SODIUM: 141 mmol/L (ref 135–145)
Total Protein: 6.7 g/dL (ref 6.0–8.3)

## 2014-10-04 MED ORDER — CIPROFLOXACIN-DEXAMETHASONE 0.3-0.1 % OT SUSP: 4.0000 [drp] | Freq: Two times a day (BID) | OTIC | Status: DC

## 2014-10-04 MED ORDER — POLYETHYLENE GLYCOL 3350 17 G PO PACK: 17.0000 g | PACK | Freq: Every day | ORAL | Status: DC

## 2014-10-04 NOTE — Telephone Encounter (Signed)
Noted and pts wife [name] is aware.

## 2014-10-04 NOTE — Telephone Encounter (Signed)
Pt wife called [name]) and states that pt was seen in the office for rectal bleeding on 09/24/2014 by LSL.  States that pt has stopped taking his prednisone because it wasn't helping.  Wife states he is now having black tarry stools for a week now.   Seen by cancer doctor on 10/01/2014.  States that now pt is constipated. Needs to know what he should do.

## 2014-10-04 NOTE — Telephone Encounter (Signed)
If he is having black, tarry stools, he needs to go to the ED.

## 2014-10-04 NOTE — ED Notes (Signed)
Notice black watery stools on Friday and last BM was Saturday.  Pt on Votient (chemo pill), took med this am.

## 2014-10-04 NOTE — Discharge Instructions (Signed)
Abdominal Pain Many things can cause abdominal pain. Usually, abdominal pain is not caused by a disease and will improve without treatment. It can often be observed and treated at home. Your health care provider will do a physical exam and possibly order blood tests and X-rays to help determine the seriousness of your pain. However, in many cases, more time must pass before a clear cause of the pain can be found. Before that point, your health care provider may not know if you need more testing or further treatment. HOME CARE INSTRUCTIONS  Monitor your abdominal pain for any changes. The following actions may help to alleviate any discomfort you are experiencing:  Only take over-the-counter or prescription medicines as directed by your health care provider.  Do not take laxatives unless directed to do so by your health care provider.  Try a clear liquid diet (broth, tea, or water) as directed by your health care provider. Slowly move to a bland diet as tolerated. SEEK MEDICAL CARE IF:  You have unexplained abdominal pain.  You have abdominal pain associated with nausea or diarrhea.  You have pain when you urinate or have a bowel movement.  You experience abdominal pain that wakes you in the night.  You have abdominal pain that is worsened or improved by eating food.  You have abdominal pain that is worsened with eating fatty foods.  You have a fever. SEEK IMMEDIATE MEDICAL CARE IF:   Your pain does not go away within 2 hours.  You keep throwing up (vomiting).  Your pain is felt only in portions of the abdomen, such as the right side or the left lower portion of the abdomen.  You pass bloody or black tarry stools. MAKE SURE YOU:  Understand these instructions.   Will watch your condition.   Will get help right away if you are not doing well or get worse.  Document Released: 06/13/2005 Document Revised: 09/08/2013 Document Reviewed: 05/13/2013 [facility] Patient Information  2015 [place], [place]. This information is not intended to replace advice given to you by your health care provider. Make sure you discuss any questions you have with your health care provider.  Otitis Externa Otitis externa is a bacterial or fungal infection of the outer ear canal. This is the area from the eardrum to the outside of the ear. Otitis externa is sometimes called "swimmer's ear." CAUSES  Possible causes of infection include:  Swimming in dirty water.  Moisture remaining in the ear after swimming or bathing.  Mild injury (trauma) to the ear.  Objects stuck in the ear (foreign body).  Cuts or scrapes (abrasions) on the outside of the ear. SIGNS AND SYMPTOMS  The first symptom of infection is often itching in the ear canal. Later signs and symptoms may include swelling and redness of the ear canal, ear pain, and yellowish-white fluid (pus) coming from the ear. The ear pain may be worse when pulling on the earlobe. DIAGNOSIS  Your health care provider will perform a physical exam. A sample of fluid may be taken from the ear and examined for bacteria or fungi. TREATMENT  Antibiotic ear drops are often given for 10 to 14 days. Treatment may also include pain medicine or corticosteroids to reduce itching and swelling. HOME CARE INSTRUCTIONS   Apply antibiotic ear drops to the ear canal as prescribed by your health care provider.  Take medicines only as directed by your health care provider.  If you have diabetes, follow any additional treatment instructions from your  health care provider.  Keep all follow-up visits as directed by your health care provider. PREVENTION   Keep your ear dry. Use the corner of a towel to absorb water out of the ear canal after swimming or bathing.  Avoid scratching or putting objects inside your ear. This can damage the ear canal or remove the protective wax that lines the canal. This makes it easier for bacteria and fungi to grow.  Avoid  swimming in lakes, polluted water, or poorly chlorinated pools.  You may use ear drops made of rubbing alcohol and vinegar after swimming. Combine equal parts of white vinegar and alcohol in a bottle. Put 3 or 4 drops into each ear after swimming. SEEK MEDICAL CARE IF:   You have a fever.  Your ear is still red, swollen, painful, or draining pus after 3 days.  Your redness, swelling, or pain gets worse.  You have a severe headache.  You have redness, swelling, pain, or tenderness in the area behind your ear. MAKE SURE YOU:   Understand these instructions.  Will watch your condition.  Will get help right away if you are not doing well or get worse. Document Released: 09/03/2005 Document Revised: 01/18/2014 Document Reviewed: 09/20/2011 [facility] Patient Information 2015 [place], [place]. This information is not intended to replace advice given to you by your health care provider. Make sure you discuss any questions you have with your health care provider.

## 2014-10-04 NOTE — ED Notes (Signed)
Pt states he had diarrhea last week, 2 days ago his stomach started hurting and he has had black tar looking stools. Cancer pt taking chemo pills, Dr. Buford Dresser sent pt to ED. Pt scheduled to have EGD upper 10/11/14.

## 2014-10-04 NOTE — ED Notes (Signed)
MD at bedside. 

## 2014-10-04 NOTE — ED Provider Notes (Signed)
CSN: [id number]     Arrival date & time 10/04/14  1326 History  This chart was scribed for [employer]. [name], MD by Edison Simon, ED Scribe. This patient was seen in room APA04/APA04 and the patient's care was started at 6:33 PM.    Chief Complaint  Patient presents with  . Abdominal Pain   The history is provided by the patient. No language interpreter was used.    HPI Comments: Jeffrey Trujillo is a 64 y.o. male cancer patient taking chemotherapy pills who presents to the Emergency Department complaining of lower abdominal pain with onset 2 days ago. 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 He reports small amount of bright red blood on wiping and in toilet bowl as well as diarrhea after eating last week for which he saw [name]. Patient now is constipated and has black tarry stool when he has stool. He states he is feeling bloated and is not passing gas. Wife notes [name] prescribed him Prednisone for gout which he stopped using before the end of his taper because he thought it might be causing his black stool. He denies lightheadedness, dizziness, bruising, or bleeding elsewhere. Patient is also complaining of right ear pain.  Past Medical History  Diagnosis Date  . Gout   . DJD (degenerative joint disease) of cervical spine   . Diverticulosis of colon     by colonoscopy 11/02/2009  . Barrett's esophagus     by EGD 11/02/2009  . Anemia   . Cancer     "was told has cancer on other kidney and spot on pancreas"  . Renal cell cancer     with mets to pancreas   Past Surgical History  Procedure Laterality Date  . Nephrectomy      right due to cancer  . Lung removal, partial      left lower lobe, benign  . Eus  08/07/2012    Procedure: UPPER ENDOSCOPIC ULTRASOUND (EUS) LINEAR;  Surgeon: [name], MD;  Location: [facility] ENDOSCOPY;  Service: Endoscopy;  Laterality: N/A;  . Inguinal hernia repair    . Hernia repair    . Inguinal hernia repair      Right  . Eus   08/21/2012    Procedure: UPPER ENDOSCOPIC ULTRASOUND (EUS) LINEAR;  Surgeon: [name], MD;  Location: [facility] ENDOSCOPY;  Service: Endoscopy;  Laterality: N/A;  . Colonoscopy  Feb 2011    [name]: single anal papilla, pan-colonic diverticula  . Esophagogastroduodenoscopy  Feb 2011    [name]: salmon-colored epithelium consistent with Barrett's, nodular antral erosions, small hiatal hernia, negative H.pylori  . Colonoscopy  09/25/2012    RMR: Colonic diverticulosis. Suspect diverticular bleeding-hepatic flexure tic-sealed  . Esophagogastroduodenoscopy  09/25/2012    TWS:FKCLEXN'T esophagus. Small hiatal hernia. Antral erosions of doubtful clinical significance, no biopsies  . Esophagogastroduodenoscopy N/A 03/11/2013    SLF: Barrett's esophagus/Single ulcer in the gastric antrum-PROBABLE SOURCE FOR BLACK STOOL/ Chronic gastritis in the gastric antrum   Family History  Problem Relation Age of Onset  . Lung cancer Brother   . Prostate cancer Brother   . Colon cancer Neg Hx   . Lumbar disc disease Brother   . Prostate cancer Brother    History  Substance Use Topics  . Smoking status: Former Smoker    Types: Cigars  . Smokeless tobacco: Never Used     Comment: occasional cigar  . Alcohol Use: Yes     Comment: vodka and  budweiser, sometimes daily, depends on notion. couple drinks    Review of Systems  Constitutional: Negative for fever and fatigue.  HENT: Negative for ear pain.   Respiratory: Negative for chest tightness and shortness of breath.   Gastrointestinal: Positive for abdominal pain, diarrhea, constipation and blood in stool.  Genitourinary: Negative for hematuria.  Musculoskeletal: Negative for joint swelling.  Skin: Negative for wound.  Allergic/Immunologic: Positive for immunocompromised state.  Neurological: Negative for dizziness and light-headedness.      Allergies  Nsaids; Demerol; and Meperidine hcl  Home Medications   Prior to Admission medications    Medication Sig Start Date End Date Taking? Authorizing Provider  hydrocortisone (ANUSOL-HC) 25 MG suppository Place 1 suppository (25 mg total) rectally 2 (two) times daily. 09/24/14  Yes [name], PA-C  megestrol (MEGACE) 400 MG/10ML suspension Take 10 mLs (400 mg total) by mouth 2 (two) times daily. 08/27/14  Yes Adrena E Johnson, PA-C  pantoprazole (PROTONIX) 40 MG tablet Take 1 tablet (40 mg total) by mouth daily. 06/16/14  Yes [name], PA-C  pazopanib (VOTRIENT) 200 MG tablet TAKE 2 TABLETS (400MG  TOTAL) BY MOUTH DAILY ON AN EMPTY STOMACH 09/20/14  Yes [name], MD  topiramate (TOPAMAX) 25 MG tablet Take 25 mg by mouth 2 (two) times daily.   Yes Historical Provider, MD  ciprofloxacin-dexamethasone (CIPRODEX) otic suspension Place 4 drops into the right ear 2 (two) times daily. 10/04/14   Jasper Riling. [name], MD  indomethacin (INDOCIN) 50 MG capsule Take 1 capsule (50 mg total) by mouth 2 (two) times daily with a meal. 04/15/14   Nita Sells, MD  polyethylene glycol (MIRALAX / GLYCOLAX) packet Take 17 g by mouth daily. 10/04/14   Jasper Riling. [name], MD  predniSONE (DELTASONE) 10 MG tablet Take 40mg  daily for 3 days, then 30mg  daily for 2 days, 20mg  daily for 2 days, 10 mg daily for 2 days, then 5mg  daily for 1 day. Patient not taking: Reported on 10/04/2014 09/24/14   [name], PA-C   BP 169/73 mmHg  Pulse 55  Temp(Src) 99.1 F (37.3 C) (Oral)  Resp 18  Ht 5\' 11"  (1.803 m)  Wt 172 lb (78.019 kg)  BMI 24.00 kg/m2  SpO2 99% Physical Exam  Constitutional: He is oriented to person, place, and time. He appears well-developed and well-nourished. No distress.  HENT:  Head: Normocephalic and atraumatic.  Right external auditory canal edematous with some white color change. Tenderness with some pulling of external ear. No tenderness over the mastoid. Left TM normal.  Eyes: Conjunctivae are normal.  Neck: Normal range of motion. Neck supple.  Cardiovascular: Normal  rate, regular rhythm and normal heart sounds.   No murmur heard. Not tachycardic  Pulmonary/Chest: Effort normal and breath sounds normal. No respiratory distress. He has no wheezes. He has no rales.  Abdominal: Soft. Bowel sounds are normal. He exhibits no distension and no mass. There is tenderness (Minimal abdominal tenderness inferiorly). There is no rebound and no guarding.     Musculoskeletal: Normal range of motion.  Neurological: He is alert and oriented to person, place, and time.  Skin: Skin is warm and dry. No pallor.  Psychiatric: He has a normal mood and affect.  Nursing note and vitals reviewed.   ED Course  Procedures (including critical care time)  COORDINATION OF CARE: 3:33 PM Discussed treatment plan with patient at beside, the patient agrees with the plan and has no further questions at this time.   Labs  Review Labs Reviewed  CBC WITH DIFFERENTIAL - Abnormal; Notable for the following:    RBC 2.71 (*)    Hemoglobin 10.0 (*)    HCT 30.5 (*)    MCV 112.5 (*)    MCH 36.9 (*)    Neutrophils Relative % 80 (*)    Lymphocytes Relative 11 (*)    Lymphs Abs 0.6 (*)    All other components within normal limits  COMPREHENSIVE METABOLIC PANEL - Abnormal; Notable for the following:    Albumin 3.2 (*)    GFR calc non Af Amer 57 (*)    GFR calc Af Amer 66 (*)    All other components within normal limits  POC OCCULT BLOOD, ED    Imaging Review Dg Abd 2 Views  10/04/2014   CLINICAL DATA:  Initial encounter for 2 day history of low abdominal pain. Personal history of renal cell carcinoma with metastatic disease.  EXAM: ABDOMEN - 2 VIEW  COMPARISON:  CT scan from 07/28/2014.  FINDINGS: Upright film shows no evidence for intraperitoneal free air. There is no evidence for gaseous bowel dilation to suggest obstruction. Bone windows show no worrisome lytic or sclerotic osseous abnormality.  IMPRESSION: Normal bowel gas pattern.   Electronically Signed   By: Misty Stanley M.D.    On: 10/04/2014 16:21     EKG Interpretation None      MDM   Final diagnoses:  Generalized abdominal pain  Otitis externa, right    Patient is guaiac-negative. Suspicion for GI bleed, but hemoglobin is at baseline. White count is normal without fever. diverticulitis not completely ruled out, but this would likely be a upper GI bleed if it was melanoma he was having. After discussion with the patient patient be started on single dose of mineral lacks a day and will follow with [name]. Will return for fevers. If abdominal pain becomes more prominent patient may need CT scan. Will also treat for his otitis externa  I personally performed the services described in this documentation, which was scribed in my presence. The recorded information has been reviewed and is accurate.    Jasper Riling. [name], MD 10/04/14 [phone]

## 2014-10-05 LAB — POC OCCULT BLOOD, ED: FECAL OCCULT BLD: NEGATIVE

## 2014-10-05 NOTE — Telephone Encounter (Signed)
Does not need to hold Votrient. Platelets normal, Hgb stable. Heme negative in ED recently. Continue with plans for EGD.

## 2014-10-05 NOTE — Telephone Encounter (Signed)
Jeffrey Trujillo, Pt had cbc on 10/04/14, will you look at it since LSL is not in the office?

## 2014-10-11 ENCOUNTER — Other Ambulatory Visit: Payer: Self-pay | Admitting: Internal Medicine

## 2014-10-11 ENCOUNTER — Encounter (HOSPITAL_COMMUNITY)
Admission: RE | Disposition: A | Discharge status: Home / Self Care | Payer: Self-pay | Source: Ambulatory Visit | Attending: Internal Medicine

## 2014-10-11 ENCOUNTER — Ambulatory Visit (HOSPITAL_COMMUNITY)
Admission: RE | Admit: 2014-10-11 | Discharge: 2014-10-11 | Disposition: A | Discharge status: Home / Self Care | Payer: 59 | Source: Ambulatory Visit | Attending: Internal Medicine | Admitting: Internal Medicine

## 2014-10-11 ENCOUNTER — Other Ambulatory Visit: Payer: Self-pay

## 2014-10-11 ENCOUNTER — Encounter (HOSPITAL_COMMUNITY): Payer: Self-pay | Admitting: *Deleted

## 2014-10-11 DIAGNOSIS — Z8711 Personal history of peptic ulcer disease: Secondary | ICD-10-CM | POA: Diagnosis not present

## 2014-10-11 DIAGNOSIS — K625 Hemorrhage of anus and rectum: Secondary | ICD-10-CM | POA: Diagnosis present

## 2014-10-11 DIAGNOSIS — K449 Diaphragmatic hernia without obstruction or gangrene: Secondary | ICD-10-CM | POA: Insufficient documentation

## 2014-10-11 DIAGNOSIS — Z888 Allergy status to other drugs, medicaments and biological substances status: Secondary | ICD-10-CM | POA: Insufficient documentation

## 2014-10-11 DIAGNOSIS — Z87891 Personal history of nicotine dependence: Secondary | ICD-10-CM | POA: Insufficient documentation

## 2014-10-11 DIAGNOSIS — R197 Diarrhea, unspecified: Secondary | ICD-10-CM

## 2014-10-11 DIAGNOSIS — K2270 Barrett's esophagus without dysplasia: Secondary | ICD-10-CM | POA: Diagnosis not present

## 2014-10-11 DIAGNOSIS — M109 Gout, unspecified: Secondary | ICD-10-CM | POA: Diagnosis not present

## 2014-10-11 DIAGNOSIS — Z85528 Personal history of other malignant neoplasm of kidney: Secondary | ICD-10-CM | POA: Insufficient documentation

## 2014-10-11 HISTORY — DX: Gastro-esophageal reflux disease without esophagitis: K21.9

## 2014-10-11 HISTORY — PX: ESOPHAGOGASTRODUODENOSCOPY: SHX5428

## 2014-10-11 SURGERY — EGD (ESOPHAGOGASTRODUODENOSCOPY)
Anesthesia: Moderate Sedation

## 2014-10-11 MED ORDER — SODIUM CHLORIDE 0.9 % IV SOLN
INTRAVENOUS | Status: DC
  Administered 2014-10-11: 10:00:00 via INTRAVENOUS

## 2014-10-11 MED ORDER — STERILE WATER FOR IRRIGATION IR SOLN
Status: DC | PRN
  Administered 2014-10-11: 10:00:00

## 2014-10-11 MED ORDER — MIDAZOLAM HCL 5 MG/5ML IJ SOLN
INTRAMUSCULAR | Status: AC
  Filled 2014-10-11: qty 10

## 2014-10-11 MED ORDER — MIDAZOLAM HCL 5 MG/5ML IJ SOLN
INTRAMUSCULAR | Status: DC | PRN
  Administered 2014-10-11: 2 mg via INTRAVENOUS

## 2014-10-11 MED ORDER — LIDOCAINE VISCOUS 2 % MT SOLN
OROMUCOSAL | Status: AC
  Filled 2014-10-11: qty 15

## 2014-10-11 MED ORDER — PROMETHAZINE HCL 25 MG/ML IJ SOLN
12.5000 mg | Freq: Once | INTRAMUSCULAR | Status: AC
  Administered 2014-10-11: 12.5 mg via INTRAVENOUS

## 2014-10-11 MED ORDER — FENTANYL CITRATE 0.05 MG/ML IJ SOLN
INTRAMUSCULAR | Status: AC
  Filled 2014-10-11: qty 4

## 2014-10-11 MED ORDER — FENTANYL CITRATE 0.05 MG/ML IJ SOLN
INTRAMUSCULAR | Status: DC | PRN
  Administered 2014-10-11: 50 ug via INTRAVENOUS

## 2014-10-11 MED ORDER — SODIUM CHLORIDE 0.9 % IJ SOLN
INTRAMUSCULAR | Status: AC
  Filled 2014-10-11: qty 3

## 2014-10-11 MED ORDER — PROMETHAZINE HCL 25 MG/ML IJ SOLN
INTRAMUSCULAR | Status: AC
  Filled 2014-10-11: qty 1

## 2014-10-11 MED ORDER — LIDOCAINE VISCOUS 2 % MT SOLN
OROMUCOSAL | Status: DC | PRN
  Administered 2014-10-11: 3 mL via OROMUCOSAL

## 2014-10-11 MED ORDER — MEPERIDINE HCL 100 MG/ML IJ SOLN
INTRAMUSCULAR | Status: AC
  Filled 2014-10-11: qty 2

## 2014-10-11 MED ORDER — ONDANSETRON HCL 4 MG/2ML IJ SOLN
INTRAMUSCULAR | Status: AC
  Filled 2014-10-11: qty 2

## 2014-10-11 NOTE — Op Note (Signed)
[facility] [address] [place], [address]   ENDOSCOPY PROCEDURE REPORT  PATIENT: Jeffrey, Trujillo  MR#: [id number] BIRTHDATE: 1950/10/31 , 65  yrs. old GENDER: male ENDOSCOPIST: R.  Garfield Cornea, MD FACP FACG REFERRED BY:  [name], M.D. PROCEDURE DATE:  10-23-2014 PROCEDURE:  EGD w/ biopsy INDICATIONS:  History of Barrett's esophagus; history of peptic ulcer disease?"verify healing. MEDICATIONS: Versed 2 mg IV and fentanyl 50 g IV in divided doses. Phenergan 12.5 mg IV and Zofran 4 mg IV.  Xylocaine gel orally. ASA CLASS:      Class III  CONSENT: The risks, benefits, limitations, alternatives and imponderables have been discussed.  The potential for biopsy, esophogeal dilation, etc. have also been reviewed.  Questions have been answered.  All parties agreeable.  Please see the history and physical in the medical record for more information.  DESCRIPTION OF PROCEDURE: After the risks benefits and alternatives of the procedure were thoroughly explained, informed consent was obtained.  The EG-[id number] ([id number]) endoscope was introduced through the mouth and advanced to the second portion of the duodenum , limited by Without limitations. The instrument was slowly withdrawn as the mucosa was fully examined.    Salmon-colored epithelium coming up to 33 cm from the incisors in an undulating circumferential distribution and extending confluently down to the EG junction (36 cm from incisors). No Esophagitis.  No nodularity.  Stomach empty.  4 cm hiatal hernia.  Previously noted gastric ulcer completely healed.  Patent pylorus.  Normal first and second portion of the duodenum.  Circumferential biopsies of the distal, abnormal esophageal mucosa taken at 1 cm increments from 36-33 cm.  Retroflexed views revealed as previously described.     The scope was then withdrawn from the patient and the procedure completed.  COMPLICATIONS: There were no immediate  complications.  ENDOSCOPIC IMPRESSION: Non-nodular Barrett's esophagus?"status post biopsy. Hiatal hernia. Previously noted gastric ulcer healed  RECOMMENDATIONS: Continue Protonix 40 mg orally daily. Limit NSAID use in the future.  Patient wife have complained to me about a three-week history of nonbloody diarrhea. Possibly antibiotic exposure 3 weeks ago. No stool studies. We'll send off a GI pathogen panel in post op. Further recommendations to follow.  REPEAT EXAM:  eSigned:  R. Garfield Cornea, MD Rosalita Chessman [facility] 2014/10/23 10:42 AM    CC:  CPT CODES: ICD CODES:  The ICD and CPT codes recommended by this software are interpretations from the data that the clinical staff has captured with the software.  The verification of the translation of this report to the ICD and CPT codes and modifiers is the sole responsibility of the health care institution and practicing physician where this report was generated.  [employer] [place]. will not be held responsible for the validity of the ICD and CPT codes included on this report.  AMA assumes no liability for data contained or not contained herein. CPT is a Designer, television/film set of the [employer].  PATIENT NAME:  Jeffrey, Trujillo MR#: [id number]

## 2014-10-11 NOTE — H&P (View-Only) (Signed)
Primary Care Physician:  PROVIDER NOT [place]  Primary Gastroenterologist:  Garfield Cornea, MD   Chief Complaint  Patient presents with  . Rectal Bleeding    HPI:  Jeffrey Trujillo is a 64 y.o. male here for further evaluation of rectal bleeding. Recently had some diarrhea. "Bottom got irritated". Started having some small volume bright red blood per rectum per [facility] on the tissue which began couple of days ago. He had about 4 episodes. Normal bowel movement this morning. No associated melena. Diarrhea R knee resolved. Denies abdominal pain, vomiting, dysphagia, heartburn, hematuria. Complains of acute onset gout in his right big toe which started 2 days ago. He held the Votrient yesterday.  GI history: -June 2014, hospitalization for diverticulitis with small perforation/abscess also had black watery stools. EGD showed known Barrett's esophagus not biopsied), single ulcer in the gastric antrum, chronic gastritis in the antrum. He was on Votrient, ETOH, NSAIDs and no PPI at the time.  -Ileocolonoscopy January 2014 suspected diverticular bleed at the hepatic flexure, sealed with resolution clips -EGD January 2014 Barrett's esophagus, antral erosions no biopsy from Barrett's.   Current Outpatient Prescriptions  Medication Sig Dispense Refill  . indomethacin (INDOCIN) 50 MG capsule Take 1 capsule (50 mg total) by mouth 2 (two) times daily with a meal.    . megestrol (MEGACE) 400 MG/10ML suspension Take 10 mLs (400 mg total) by mouth 2 (two) times daily. 240 mL 0  . pantoprazole (PROTONIX) 40 MG tablet Take 1 tablet (40 mg total) by mouth daily. 30 tablet 1  . pazopanib (VOTRIENT) 200 MG tablet TAKE 2 TABLETS (400MG  TOTAL) BY MOUTH DAILY ON AN EMPTY STOMACH 60 tablet 0  . topiramate (TOPAMAX) 25 MG tablet Take 25 mg by mouth 2 (two) times daily.     No current facility-administered medications for this visit.    Allergies as of 09/24/2014 - Review Complete 09/24/2014  Allergen Reaction  Noted  . Nsaids Other (See Comments) 06/05/2011  . Demerol [meperidine] Rash 01/11/2014  . Meperidine hcl Hives and Rash     Past Medical History  Diagnosis Date  . Gout   . DJD (degenerative joint disease) of cervical spine   . Diverticulosis of colon     by colonoscopy 11/02/2009  . Barrett's esophagus     by EGD 11/02/2009  . Anemia   . Cancer     "was told has cancer on other kidney and spot on pancreas"  . Renal cell cancer     with mets to pancreas    Past Surgical History  Procedure Laterality Date  . Nephrectomy      right due to cancer  . Lung removal, partial      left lower lobe, benign  . Eus  08/07/2012    Procedure: UPPER ENDOSCOPIC ULTRASOUND (EUS) LINEAR;  Surgeon: [name], MD;  Location: [facility];  Service: Endoscopy;  Laterality: N/A;  . Inguinal hernia repair    . Hernia repair    . Inguinal hernia repair      Right  . Eus  08/21/2012    Procedure: UPPER ENDOSCOPIC ULTRASOUND (EUS) LINEAR;  Surgeon: [name], MD;  Location: [facility];  Service: Endoscopy;  Laterality: N/A;  . Colonoscopy  Feb 2011    [name]: single anal papilla, pan-colonic diverticula  . Esophagogastroduodenoscopy  Feb 2011    [name]: salmon-colored epithelium consistent with Barrett's, nodular antral erosions, small hiatal hernia, negative H.pylori  . Colonoscopy  09/25/2012  RMR: Colonic diverticulosis. Suspect diverticular bleeding-hepatic flexure tic-sealed  . Esophagogastroduodenoscopy  09/25/2012    GGY:IRSWNIO'E esophagus. Small hiatal hernia. Antral erosions of doubtful clinical significance, no biopsies  . Esophagogastroduodenoscopy N/A 03/11/2013    SLF: Barrett's esophagus/Single ulcer in the gastric antrum-PROBABLE SOURCE FOR BLACK STOOL/ Chronic gastritis in the gastric antrum    Family History  Problem Relation Age of Onset  . Lung cancer Brother   . Prostate cancer Brother   . Colon cancer Neg Hx   . Lumbar disc disease Brother   . Prostate  cancer Brother     History   Social History  . Marital Status: Married    Spouse Name: N/A    Number of Children: N/A  . Years of Education: N/A   Occupational History  .      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 Smoking status: Former Smoker    Types: Cigars  . Smokeless tobacco: Never Used     Comment: occasional cigar  . Alcohol Use: Yes     Comment: vodka and budweiser, sometimes daily, depends on notion. couple drinks  . Drug Use: No  . Sexual Activity: Yes   Other Topics Concern  . Not on file   Social History Narrative      ROS:  General: Negative for anorexia, weight loss, fever, chills, fatigue, weakness. Eyes: Negative for vision changes.  ENT: Negative for hoarseness, difficulty swallowing , nasal congestion. CV: Negative for chest pain, angina, palpitations, dyspnea on exertion, peripheral edema.  Respiratory: Negative for dyspnea at rest, dyspnea on exertion, cough, sputum, wheezing.  GI: See history of present illness. GU:  Negative for dysuria, hematuria, urinary incontinence, urinary frequency, nocturnal urination.  MS: Negative for joint pain, low back pain.  Derm: Negative for rash or itching.  Neuro: Negative for weakness, abnormal sensation, seizure, frequent headaches, memory loss, confusion.  Psych: Negative for anxiety, depression, suicidal ideation, hallucinations.  Endo: Negative for unusual weight change.  Heme: Negative for bruising or bleeding. Allergy: Negative for rash or hives.    Physical Examination:  BP 121/72 mmHg  Pulse 70  Temp(Src) 97.2 F (36.2 C)  Ht 5\' 11"  (1.803 m)  Wt 166 lb 3.2 oz (75.388 kg)  BMI 23.19 kg/m2   General: Well-nourished, well-developed in no acute distress.  Head: Normocephalic, atraumatic.   Eyes: Conjunctiva pink, no icterus. Mouth: Oropharyngeal mucosa moist and pink , no lesions erythema or exudate. Neck: Supple without thyromegaly, masses, or lymphadenopathy.  Lungs:  Clear to auscultation bilaterally.  Heart: Regular rate and rhythm, no murmurs rubs or gallops.  Abdomen: Bowel sounds are normal, nontender, nondistended, no hepatosplenomegaly or masses, no abdominal bruits or    hernia , no rebound or guarding.   Rectal: No external lesions. Soft brown stool heme positive. Nontender exam. No masses felt. Extremities: No lower extremity edema. No clubbing. Right first metatarsal erythematous swollen warm consistent with gout  Neuro: Alert and oriented x 4 , grossly normal neurologically.  Skin: Warm and dry, no rash or jaundice.   Psych: Alert and cooperative, normal mood and affect.  Labs: Lab Results  Component Value Date   WBC 3.6* 08/27/2014   HGB 9.9* 08/27/2014   HCT 29.5* 08/27/2014   MCV 113.1* 08/27/2014   PLT 164 08/27/2014   Lab Results  Component Value Date   CREATININE 1.2 08/27/2014   BUN 10.8 08/27/2014   NA 143 08/27/2014   K 4.0 08/27/2014   CL 103  07/12/2014   CO2 22 08/27/2014   Lab Results  Component Value Date   ALT 40 08/27/2014   AST 26 08/27/2014   ALKPHOS 165* 08/27/2014   BILITOT 1.00 08/27/2014     Imaging Studies: No results found.

## 2014-10-11 NOTE — Interval H&P Note (Signed)
History and Physical Interval Note:  10/11/2014 10:09 AM  Jeffrey Trujillo  has presented today for surgery, with the diagnosis of barrett's/  H/O PUD  The various methods of treatment have been discussed with the patient and family. After consideration of risks, benefits and other options for treatment, the patient has consented to  Procedure(s) with comments: ESOPHAGOGASTRODUODENOSCOPY (EGD) (N/A) - 1030  as a surgical intervention .  The patient's history has been reviewed, patient examined, no change in status, stable for surgery.  I have reviewed the patient's chart and labs.  Questions were answered to the patient's satisfaction.     Jeffrey Trujillo  No change. EGD per plan.The risks, benefits, limitations, alternatives and imponderables have been reviewed with the patient. Potential for esophageal dilation, biopsy, etc. have also been reviewed.  Questions have been answered. All parties agreeable.

## 2014-10-11 NOTE — Discharge Instructions (Addendum)
EGD Discharge instructions Please read the instructions outlined below and refer to this sheet in the next few weeks. These discharge instructions provide you with general information on caring for yourself after you leave the hospital. Your doctor may also give you specific instructions. While your treatment has been planned according to the most current medical practices available, unavoidable complications occasionally occur. If you have any problems or questions after discharge, please call your doctor. ACTIVITY You may resume your regular activity but move at a slower pace for the next 24 hours.  Take frequent rest periods for the next 24 hours.  Walking will help expel (get rid of) the air and reduce the bloated feeling in your abdomen.  No driving for 24 hours (because of the anesthesia (medicine) used during the test).  You may shower.  Do not sign any important legal documents or operate any machinery for 24 hours (because of the anesthesia used during the test).  NUTRITION Drink plenty of fluids.  You may resume your normal diet.  Begin with a light meal and progress to your normal diet.  Avoid alcoholic beverages for 24 hours or as instructed by your caregiver.  MEDICATIONS You may resume your normal medications unless your caregiver tells you otherwise.  WHAT YOU CAN EXPECT TODAY You may experience abdominal discomfort such as a feeling of fullness or gas pains.  FOLLOW-UP Your doctor will discuss the results of your test with you.  SEEK IMMEDIATE MEDICAL ATTENTION IF ANY OF THE FOLLOWING OCCUR: Excessive nausea (feeling sick to your stomach) and/or vomiting.  Severe abdominal pain and distention (swelling).  Trouble swallowing.  Temperature over 101 F (37.8 C).  Rectal bleeding or vomiting of blood.    GERD and Barrett's esophagus information provided  Continue Protonix 40 mg daily  Use Indocin and all nonsteroidals sparingly as they put you at risk for peptic ulcer  disease  Further recommendations to follow pending review of pathology report     Send a stool sample for GI pathogen panel in post-op  Barrett's Esophagus Barrett's esophagus occurs when the lining of the esophagus is damaged. The esophagus is the tube that carries food from the mouth to the stomach. With Barrett's esophagus, the lining of the esophagus gets replaced by material that is similar to the lining in the intestines. This process is called intestinal metaplasia. A small number of people with Barrett's esophagus develop esophageal cancer. CAUSES  The exact cause of Barrett's esophagus is unknown. SYMPTOMS  Most people with Barrett's esophagus do not have symptoms. However, many patients also have gastroesophageal reflux disease (GERD). GERD can cause heartburn, trouble swallowing, and a dry cough. DIAGNOSIS Barrett's esophagus is diagnosed by an exam called upper gastrointestinal endoscopy. A thin, flexible tube (endoscope) is passed down the esophagus. The endoscope has a light and camera on the end. Your caregiver uses the endoscope to view the inside of the esophagus. A tissue sample may also be taken and examined under a microscope (biopsy). If cancer cells are found during the biopsy, this condition is called dysplasia. TREATMENT  If you have no dysplasia or low-grade dysplasia, your caregiver may recommend no treatment or only taking medicines to treat GERD. Sometimes, taking acid-blocking drugs to treat GERD helps improve the tissue affected by Barrett's esophagus. Your caregiver may also recommend periodic esophageal exams. If you have high-grade dysplasia, treatment may include removing the damaged parts of the esophagus. This can be done by heating, freezing, or surgically removing the tissue. In some  cases, surgery may be done to remove most of the esophagus. The stomach is then attached to the remaining portion of the esophagus. HOME CARE INSTRUCTIONS  Take acid-blocking  drugs for GERD if recommended by your caregiver.  Keep all follow-up appointments as directed by your caregiver. You may need periodic esophageal exams. SEEK IMMEDIATE MEDICAL CARE IF:  You have chest pain.  You have trouble swallowing.  You vomit blood or material that looks like coffee grounds.  Your stools are bright red or dark. Document Released: 11/24/2003 Document Revised: 03/04/2012 Document Reviewed: 11/13/2011 [facility] Patient Information 2015 [place], [place]. This information is not intended to replace advice given to you by your health care provider. Make sure you discuss any questions you have with your health care provider.   Gastroesophageal Reflux Disease, Adult Gastroesophageal reflux disease (GERD) happens when acid from your stomach flows up into the esophagus. When acid comes in contact with the esophagus, the acid causes soreness (inflammation) in the esophagus. Over time, GERD may create small holes (ulcers) in the lining of the esophagus. CAUSES   Increased body weight. This puts pressure on the stomach, making acid rise from the stomach into the esophagus.  Smoking. This increases acid production in the stomach.  Drinking alcohol. This causes decreased pressure in the lower esophageal sphincter (valve or ring of muscle between the esophagus and stomach), allowing acid from the stomach into the esophagus.  Late evening meals and a full stomach. This increases pressure and acid production in the stomach.  A malformed lower esophageal sphincter. Sometimes, no cause is found. SYMPTOMS   Burning pain in the lower part of the mid-chest behind the breastbone and in the mid-stomach area. This may occur twice a week or more often.  Trouble swallowing.  Sore throat.  Dry cough.  Asthma-like symptoms including chest tightness, shortness of breath, or wheezing. DIAGNOSIS  Your caregiver may be able to diagnose GERD based on your symptoms. In some cases, X-rays  and other tests may be done to check for complications or to check the condition of your stomach and esophagus. TREATMENT  Your caregiver may recommend over-the-counter or prescription medicines to help decrease acid production. Ask your caregiver before starting or adding any new medicines.  HOME CARE INSTRUCTIONS   Change the factors that you can control. Ask your caregiver for guidance concerning weight loss, quitting smoking, and alcohol consumption.  Avoid foods and drinks that make your symptoms worse, such as:  Caffeine or alcoholic drinks.  Chocolate.  Peppermint or mint flavorings.  Garlic and onions.  Spicy foods.  Citrus fruits, such as oranges, lemons, or limes.  Tomato-based foods such as sauce, chili, salsa, and pizza.  Fried and fatty foods.  Avoid lying down for the 3 hours prior to your bedtime or prior to taking a nap.  Eat small, frequent meals instead of large meals.  Wear loose-fitting clothing. Do not wear anything tight around your waist that causes pressure on your stomach.  Raise the head of your bed 6 to 8 inches with wood blocks to help you sleep. Extra pillows will not help.  Only take over-the-counter or prescription medicines for pain, discomfort, or fever as directed by your caregiver.  Do not take aspirin, ibuprofen, or other nonsteroidal anti-inflammatory drugs (NSAIDs). SEEK IMMEDIATE MEDICAL CARE IF:   You have pain in your arms, neck, jaw, teeth, or back.  Your pain increases or changes in intensity or duration.  You develop nausea, vomiting, or sweating (diaphoresis).  You  develop shortness of breath, or you faint.  Your vomit is green, yellow, black, or looks like coffee grounds or blood.  Your stool is red, bloody, or black. These symptoms could be signs of other problems, such as heart disease, gastric bleeding, or esophageal bleeding. MAKE SURE YOU:   Understand these instructions.  Will watch your condition.  Will get  help right away if you are not doing well or get worse. Document Released: 06/13/2005 Document Revised: 11/26/2011 Document Reviewed: 03/23/2011 [facility] Patient Information 2015 [place], [place]. This information is not intended to replace advice given to you by your health care provider. Make sure you discuss any questions you have with your health care provider.

## 2014-10-12 ENCOUNTER — Encounter (HOSPITAL_COMMUNITY): Payer: Self-pay | Admitting: Internal Medicine

## 2014-10-15 ENCOUNTER — Encounter: Payer: Self-pay | Admitting: Internal Medicine

## 2014-10-18 ENCOUNTER — Telehealth: Payer: Self-pay

## 2014-10-18 ENCOUNTER — Encounter: Payer: Self-pay | Admitting: Internal Medicine

## 2014-10-18 NOTE — Telephone Encounter (Signed)
APPOINTMENT MADE AND LETTER SENT °

## 2014-10-18 NOTE — Telephone Encounter (Signed)
Letter mailed to the pt. 

## 2014-10-18 NOTE — Telephone Encounter (Signed)
Per RMR- Send letter to patient.  Send copy of letter with path to referring provider and PCP. Please call patient and emphasize repeat EGD in 3 months and twice a day Protonix between now and then.  Needs an office visit in 3 months to set up

## 2014-10-19 LAB — GASTROINTESTINAL PATHOGEN PANEL PCR
C. difficile Tox A/B, PCR: NEGATIVE
Campylobacter, PCR: NEGATIVE
Cryptosporidium, PCR: NEGATIVE
E COLI 0157, PCR: NEGATIVE
E coli (ETEC) LT/ST PCR: NEGATIVE
E coli (STEC) stx1/stx2, PCR: NEGATIVE
Giardia lamblia, PCR: NEGATIVE
Norovirus, PCR: NEGATIVE
Rotavirus A, PCR: NEGATIVE
SHIGELLA, PCR: NEGATIVE
Salmonella, PCR: NEGATIVE

## 2014-10-20 NOTE — Telephone Encounter (Signed)
Tried to call pt- LMOM 

## 2014-10-21 NOTE — Telephone Encounter (Signed)
Tried to call pt- LMOM 

## 2014-10-22 NOTE — Telephone Encounter (Signed)
Tried to call pt- LMOM 

## 2014-10-26 NOTE — Telephone Encounter (Signed)
Pt is aware to take protonix bid and aware of his appt.

## 2014-11-02 ENCOUNTER — Other Ambulatory Visit: Payer: 59

## 2014-11-02 ENCOUNTER — Ambulatory Visit: Payer: 59 | Admitting: Oncology

## 2014-11-03 ENCOUNTER — Telehealth: Payer: Self-pay | Admitting: Oncology

## 2014-11-03 NOTE — Telephone Encounter (Signed)
Pt's wife called lft msg to r/s due to weather, called back and lft msg for pt confirming labs/ov from 02/16 to 03/22, advised pt to c/b if this D/T is not convenient for him.... KJ

## 2014-11-10 ENCOUNTER — Other Ambulatory Visit: Payer: Self-pay | Admitting: Gastroenterology

## 2014-11-10 NOTE — Telephone Encounter (Signed)
Can we please ask patient's oncologist if we can safely increase the pantoprazole to BID for three months for some possibly esophageal dysplasia.   Pantoprazole can interfere with efficacy of his cancer drug, Votrient.

## 2014-11-11 NOTE — Telephone Encounter (Signed)
Message sent to oncology.

## 2014-12-07 ENCOUNTER — Telehealth: Payer: Self-pay | Admitting: Oncology

## 2014-12-07 ENCOUNTER — Other Ambulatory Visit (HOSPITAL_BASED_OUTPATIENT_CLINIC_OR_DEPARTMENT_OTHER): Payer: 59

## 2014-12-07 ENCOUNTER — Ambulatory Visit (HOSPITAL_BASED_OUTPATIENT_CLINIC_OR_DEPARTMENT_OTHER): Payer: 59 | Admitting: Oncology

## 2014-12-07 ENCOUNTER — Other Ambulatory Visit: Payer: Self-pay

## 2014-12-07 VITALS — BP 152/67 | HR 58 | Temp 97.9°F | Resp 16 | Ht 71.0 in | Wt 168.1 lb

## 2014-12-07 DIAGNOSIS — D649 Anemia, unspecified: Secondary | ICD-10-CM

## 2014-12-07 DIAGNOSIS — C642 Malignant neoplasm of left kidney, except renal pelvis: Secondary | ICD-10-CM

## 2014-12-07 DIAGNOSIS — C7989 Secondary malignant neoplasm of other specified sites: Secondary | ICD-10-CM

## 2014-12-07 DIAGNOSIS — D6959 Other secondary thrombocytopenia: Secondary | ICD-10-CM | POA: Diagnosis not present

## 2014-12-07 DIAGNOSIS — C649 Malignant neoplasm of unspecified kidney, except renal pelvis: Secondary | ICD-10-CM

## 2014-12-07 DIAGNOSIS — C641 Malignant neoplasm of right kidney, except renal pelvis: Secondary | ICD-10-CM

## 2014-12-07 LAB — COMPREHENSIVE METABOLIC PANEL (CC13)
ALT: 17 U/L (ref 0–55)
ANION GAP: 13 meq/L — AB (ref 3–11)
AST: 24 U/L (ref 5–34)
Albumin: 2.7 g/dL — ABNORMAL LOW (ref 3.5–5.0)
Alkaline Phosphatase: 104 U/L (ref 40–150)
BUN: 16.6 mg/dL (ref 7.0–26.0)
CO2: 23 meq/L (ref 22–29)
Calcium: 8.9 mg/dL (ref 8.4–10.4)
Chloride: 113 mEq/L — ABNORMAL HIGH (ref 98–109)
Creatinine: 1.3 mg/dL (ref 0.7–1.3)
EGFR: 58 mL/min/{1.73_m2} — ABNORMAL LOW (ref >90)
Glucose: 93 mg/dl (ref 70–140)
Potassium: 3.5 mEq/L (ref 3.5–5.1)
SODIUM: 149 meq/L — AB (ref 136–145)
TOTAL PROTEIN: 6.7 g/dL (ref 6.4–8.3)
Total Bilirubin: 0.41 mg/dL (ref 0.20–1.20)

## 2014-12-07 LAB — CBC WITH DIFFERENTIAL/PLATELET
BASO%: 0.7 % (ref 0.0–2.0)
BASOS ABS: 0 10*3/uL (ref 0.0–0.1)
EOS%: 2.8 % (ref 0.0–7.0)
Eosinophils Absolute: 0.1 10*3/uL (ref 0.0–0.5)
HCT: 28.9 % — ABNORMAL LOW (ref 38.4–49.9)
HGB: 9.3 g/dL — ABNORMAL LOW (ref 13.0–17.1)
LYMPH%: 19.2 % (ref 14.0–49.0)
MCH: 32.6 pg (ref 27.2–33.4)
MCHC: 32.2 g/dL (ref 32.0–36.0)
MCV: 101.4 fL — AB (ref 79.3–98.0)
MONO#: 0.2 10*3/uL (ref 0.1–0.9)
MONO%: 6.6 % (ref 0.0–14.0)
NEUT#: 2 10*3/uL (ref 1.5–6.5)
NEUT%: 70.7 % (ref 39.0–75.0)
Platelets: 227 10*3/uL (ref 140–400)
RBC: 2.85 10*6/uL — ABNORMAL LOW (ref 4.20–5.82)
RDW: 19.7 % — ABNORMAL HIGH (ref 11.0–14.6)
WBC: 2.9 10*3/uL — ABNORMAL LOW (ref 4.0–10.3)
lymph#: 0.6 10*3/uL — ABNORMAL LOW (ref 0.9–3.3)

## 2014-12-07 NOTE — Progress Notes (Signed)
Hematology and Oncology Follow Up Visit  Jeffrey Trujillo [id number] 17-Oct-1950 64 y.o.    Principle Diagnosis: 64 year old with the following issues:  1. T1b right kidney cancer diagnosed in 2010. He is S/P rdical nephrectomy on June 27, 2009. The pathological staging was T1b clear cell histology with Fuhrman grade 3/4.  2. Left Kidney mass  Measuring 4.5 x 2 7 x 3.6 irregular enhancing appears to be a new kidney cancer noted in 07/2012. 3. Pancreatic lesion. 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  Current therapy: Votrient  800 mg daily started in December 2013. Treatment held in 11/2012 due to increase LFTs. Votrient was resumed in April 2014 at 400 mg daily.  Interim History: Jeffrey Trujillo presents for a follow up visit by him self. Since his last visit, he is doing very well. He continues to tolerate the current dose without any complications. He does report loose bowel movements about 2-3 times a day but no diarrhea or hematochezia. He continues to work full time without any decline in his ability. He requests a note for work verifying his appointment today. He does not report any nausea or vomiting. Does not report any dysphasia or difficulty with swallowing. He denies chest pain, shortness of breath, abdominal pain,  No rashes but continues to have easy bruising.  He continues to work full-time without any difficulty at this time. He has not reported any constitutional symptom. He did not report any headaches blurred vision double vision. Does not report any motor sensory neuropathy or alteration of mental status. He does not report any depression or anxiety. He does not report any frequency urgency or hesitancy. He does not report any other urinary symptoms.   Does not report any lymphadenopathy or any constitutional symptoms. He does report some difficulty sleeping. Remainder of his review of systems is unremarkable.  Medications: Unchanged by my review today. Current  Outpatient Prescriptions  Medication Sig Dispense Refill  . ciprofloxacin-dexamethasone (CIPRODEX) otic suspension Place 4 drops into the right ear 2 (two) times daily. 7.5 mL 0  . hydrocortisone (ANUSOL-HC) 25 MG suppository Place 1 suppository (25 mg total) rectally 2 (two) times daily. 12 suppository 0  . indomethacin (INDOCIN) 50 MG capsule Take 1 capsule (50 mg total) by mouth 2 (two) times daily with a meal.    . megestrol (MEGACE) 400 MG/10ML suspension Take 10 mLs (400 mg total) by mouth 2 (two) times daily. 240 mL 0  . pantoprazole (PROTONIX) 40 MG tablet Take 1 tablet (40 mg total) by mouth daily. 30 tablet 1  . pazopanib (VOTRIENT) 200 MG tablet TAKE 2 TABLETS (400MG  TOTAL) BY MOUTH DAILY ON AN EMPTY STOMACH 60 tablet 0  . polyethylene glycol (MIRALAX / GLYCOLAX) packet Take 17 g by mouth daily. 14 each 0  . predniSONE (DELTASONE) 10 MG tablet Take 40mg  daily for 3 days, then 30mg  daily for 2 days, 20mg  daily for 2 days, 10 mg daily for 2 days, then 5mg  daily for 1 day. 25 tablet 0  . topiramate (TOPAMAX) 25 MG tablet Take 25 mg by mouth 2 (two) times daily.     No current facility-administered medications for this visit.     Allergies:  Allergies  Allergen Reactions  . Nsaids Other (See Comments)    Patient only has one kidney.   . Meperidine Hcl Hives and Rash    Past Medical History, Surgical history, Social history, and Family History were reviewed and updated.   Physical  Exam: Blood pressure 152/67, pulse 58, temperature 97.9 F (36.6 C), temperature source Oral, resp. rate 16, height 5\' 11"  (1.803 m), weight 168 lb 1.6 oz (76.25 kg), SpO2 100 %. ECOG: 1 General appearance: alert and awake appeared in no active distress. Head: Normocephalic, without obvious abnormality.  Neck: no adenopathy, no masses Lymph nodes: Cervical, supraclavicular, and axillary nodes normal. Heart:regular rate and rhythm, S1, S2.  Lung:chest clear, no wheezing, or dullness to  percussion. Abdomen: soft, non-tender, without masses or organomegaly no shifting dullness or ascites EXT:no erythema, induration, or nodules. No edema. Neurological exam: no motor or sensory deficits noted. Skin: No rashes or lesions noted. Ecchymosis noted on his upper extremities. Neurological: Intact no deficits.  CBC    Component Value Date/Time   WBC 2.9* 12/07/2014 0836   WBC 5.6 10/04/2014 1555   RBC 2.85* 12/07/2014 0836   RBC 2.71* 10/04/2014 1555   HGB 9.3* 12/07/2014 0836   HGB 10.0* 10/04/2014 1555   HCT 28.9* 12/07/2014 0836   HCT 30.5* 10/04/2014 1555   PLT 227 12/07/2014 0836   PLT 249 10/04/2014 1555   MCV 101.4* 12/07/2014 0836   MCV 112.5* 10/04/2014 1555   MCH 32.6 12/07/2014 0836   MCH 36.9* 10/04/2014 1555   MCHC 32.2 12/07/2014 0836   MCHC 32.8 10/04/2014 1555   RDW 19.7* 12/07/2014 0836   RDW 15.5 10/04/2014 1555   LYMPHSABS 0.6* 12/07/2014 0836   LYMPHSABS 0.6* 10/04/2014 1555   MONOABS 0.2 12/07/2014 0836   MONOABS 0.5 10/04/2014 1555   EOSABS 0.1 12/07/2014 0836   EOSABS 0.0 10/04/2014 1555   BASOSABS 0.0 12/07/2014 0836   BASOSABS 0.0 10/04/2014 1555     Impression and Plan:   A 64 year old gentleman with the following issues:   1. A history of renal cell carcinoma. He had stage T1b diagnosed in October 2010 of the right kidney, status post right nephrectomy. He also has left kidney cancer and pancreatic metastasis. He is currently on Votrient without any new complications. CT scan from 07/28/2014 showed stable disease. His left renal mass is unchanged without any other measurable disease. The plan is to continue with the current dose and regimen and repeat scan before the next visit.  2. Pancreatic lesion. Biopsy proven to be malignant and likely represent renal cell cancer. Regressed on Votrient at his last CT scan did not show any evidence of that at this time.  3. Thrombocytopenia: This is related to Votrient and has been improved and  stable recently.  4. Anemia. Hgb is stable. No active bleeding. No transfusion is indicated.   5. Increase LFTs: back to normal now. Will continue to follow this.   6. Weight loss: weight is stable from last few months.  7. Follow-up: on 01/20/2015 after a CT scan.  [name] MD 12/07/2014

## 2014-12-07 NOTE — Telephone Encounter (Signed)
Pt confirmed labs/ov per 03/22 POF, gave pt AVS and calendar..... KJ, gave pt barium

## 2014-12-16 ENCOUNTER — Encounter: Payer: Self-pay | Admitting: *Deleted

## 2014-12-16 NOTE — Progress Notes (Signed)
rec'd call from [facility] in scheduling @ [facility]. They are unable to reach patient to schedule a CT. This RN called and left messages to call carrie @ [phone] to schedule his CT scan.

## 2014-12-21 ENCOUNTER — Telehealth: Payer: Self-pay | Admitting: Gastroenterology

## 2014-12-21 ENCOUNTER — Telehealth: Payer: Self-pay | Admitting: *Deleted

## 2014-12-21 ENCOUNTER — Telehealth: Payer: Self-pay | Admitting: Internal Medicine

## 2014-12-21 ENCOUNTER — Telehealth: Payer: Self-pay

## 2014-12-21 DIAGNOSIS — C649 Malignant neoplasm of unspecified kidney, except renal pelvis: Secondary | ICD-10-CM

## 2014-12-21 MED ORDER — PANTOPRAZOLE SODIUM 40 MG PO TBEC: 40.0000 mg | DELAYED_RELEASE_TABLET | Freq: Two times a day (BID) | ORAL | Status: DC

## 2014-12-21 NOTE — Telephone Encounter (Signed)
error 

## 2014-12-21 NOTE — Telephone Encounter (Signed)
Previously approved Pantoprazole BID for 3 months with [name] as patient is on Votrient (potential decrease efficacy)   Refill done. Patient to keep appt in 01/2015.

## 2014-12-21 NOTE — Telephone Encounter (Signed)
Pt is aware.  

## 2014-12-21 NOTE — Telephone Encounter (Signed)
-----   Message from [name], PA-C sent at 12/21/2014  1:30 PM EDT -----   ----- Message -----    From: [name], PA-C    Sent: 12/16/2014   3:03 PM      To: Claudina Lick, LPN  noted ----- Message -----    From: Claudina Lick, LPN    Sent: 0/99/8338   2:17 PM      To: Trevor Iha orginally sent this to the PA at oncology and she never answered,I noticed today she didn't answer it so I sent this to the MD that saw him last week and this was his answer.   ----- Message -----    From: [name], MD    Sent: 12/16/2014   7:53 AM      To: Claudina Lick, LPN  I see no problems with that.   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  She is  concerned it will interfere with the efficacy of his votrient.   Thank you,  Burnadette Peter LPN [facility]

## 2014-12-21 NOTE — Telephone Encounter (Signed)
Wife states patient saw PCP yesterday. Hgb 7.8 /PCP wanted to make Dr Alen Blew aware. Wife states patient is doing well, no SOB- went to work today. To see PCP again next week for CBC recheck. Will forward to Dr Alen Blew.

## 2014-12-21 NOTE — Telephone Encounter (Signed)
Routing to the refill box. 

## 2014-12-21 NOTE — Telephone Encounter (Signed)
Pt's wife called to say that they have been waiting for 2 weeks on his Protonix prescription and he is out. She said that [facility] hasn't gotten anything from [place]. I asked if we were working on a PA, but she didn't know. Please advise and call her or patient back at [phone]

## 2014-12-31 ENCOUNTER — Telehealth: Payer: Self-pay | Admitting: *Deleted

## 2014-12-31 NOTE — Telephone Encounter (Signed)
VM message from patient. His message was in regards to worsening swelling in his hands and feet and legs to his thighs.  States he was given steroids for this awhile ago (looks like January 2016). He wanted to know what he should do.  Attempted to call patient back [phone] and at home #. No answer at either. Left message for patient to call back.

## 2015-01-03 ENCOUNTER — Emergency Department (HOSPITAL_COMMUNITY): Payer: 59

## 2015-01-03 ENCOUNTER — Emergency Department (HOSPITAL_COMMUNITY)
Admission: EM | Admit: 2015-01-03 | Discharge: 2015-01-03 | Disposition: A | Discharge status: Home / Self Care | Payer: 59 | Attending: Emergency Medicine | Admitting: Emergency Medicine

## 2015-01-03 ENCOUNTER — Encounter (HOSPITAL_COMMUNITY): Payer: Self-pay | Admitting: Cardiology

## 2015-01-03 DIAGNOSIS — T380X1A Poisoning by glucocorticoids and synthetic analogues, accidental (unintentional), initial encounter: Secondary | ICD-10-CM | POA: Diagnosis not present

## 2015-01-03 DIAGNOSIS — R2240 Localized swelling, mass and lump, unspecified lower limb: Secondary | ICD-10-CM | POA: Diagnosis present

## 2015-01-03 DIAGNOSIS — Z862 Personal history of diseases of the blood and blood-forming organs and certain disorders involving the immune mechanism: Secondary | ICD-10-CM | POA: Insufficient documentation

## 2015-01-03 DIAGNOSIS — Z85528 Personal history of other malignant neoplasm of kidney: Secondary | ICD-10-CM | POA: Insufficient documentation

## 2015-01-03 DIAGNOSIS — R609 Edema, unspecified: Secondary | ICD-10-CM

## 2015-01-03 DIAGNOSIS — K219 Gastro-esophageal reflux disease without esophagitis: Secondary | ICD-10-CM | POA: Diagnosis not present

## 2015-01-03 DIAGNOSIS — Z793 Long term (current) use of hormonal contraceptives: Secondary | ICD-10-CM | POA: Insufficient documentation

## 2015-01-03 DIAGNOSIS — Z79899 Other long term (current) drug therapy: Secondary | ICD-10-CM | POA: Insufficient documentation

## 2015-01-03 DIAGNOSIS — Z87891 Personal history of nicotine dependence: Secondary | ICD-10-CM | POA: Diagnosis not present

## 2015-01-03 DIAGNOSIS — M109 Gout, unspecified: Secondary | ICD-10-CM | POA: Insufficient documentation

## 2015-01-03 DIAGNOSIS — Z7952 Long term (current) use of systemic steroids: Secondary | ICD-10-CM | POA: Insufficient documentation

## 2015-01-03 DIAGNOSIS — T50905A Adverse effect of unspecified drugs, medicaments and biological substances, initial encounter: Secondary | ICD-10-CM

## 2015-01-03 LAB — COMPREHENSIVE METABOLIC PANEL
ALK PHOS: 85 U/L (ref 39–117)
ALT: 12 U/L (ref 0–53)
AST: 20 U/L (ref 0–37)
Albumin: 2.9 g/dL — ABNORMAL LOW (ref 3.5–5.2)
Anion gap: 9 (ref 5–15)
BUN: 13 mg/dL (ref 6–23)
CO2: 28 mmol/L (ref 19–32)
Calcium: 8.1 mg/dL — ABNORMAL LOW (ref 8.4–10.5)
Chloride: 108 mmol/L (ref 96–112)
Creatinine, Ser: 1.17 mg/dL (ref 0.50–1.35)
GFR calc Af Amer: 75 mL/min — ABNORMAL LOW (ref >90)
GFR calc non Af Amer: 65 mL/min — ABNORMAL LOW (ref >90)
Glucose, Bld: 99 mg/dL (ref 70–99)
Potassium: 3.2 mmol/L — ABNORMAL LOW (ref 3.5–5.1)
Sodium: 145 mmol/L (ref 135–145)
Total Bilirubin: 0.8 mg/dL (ref 0.3–1.2)
Total Protein: 6.3 g/dL (ref 6.0–8.3)

## 2015-01-03 LAB — CBC WITH DIFFERENTIAL/PLATELET
BASOS PCT: 0 % (ref 0–1)
Basophils Absolute: 0 10*3/uL (ref 0.0–0.1)
Eosinophils Absolute: 0.1 10*3/uL (ref 0.0–0.7)
Eosinophils Relative: 2 % (ref 0–5)
HCT: 30.7 % — ABNORMAL LOW (ref 39.0–52.0)
Hemoglobin: 9.6 g/dL — ABNORMAL LOW (ref 13.0–17.0)
Lymphocytes Relative: 17 % (ref 12–46)
Lymphs Abs: 0.6 10*3/uL — ABNORMAL LOW (ref 0.7–4.0)
MCH: 32.2 pg (ref 26.0–34.0)
MCHC: 31.3 g/dL (ref 30.0–36.0)
MCV: 103 fL — ABNORMAL HIGH (ref 78.0–100.0)
Monocytes Absolute: 0.2 10*3/uL (ref 0.1–1.0)
Monocytes Relative: 7 % (ref 3–12)
NEUTROS PCT: 74 % (ref 43–77)
Neutro Abs: 2.6 10*3/uL (ref 1.7–7.7)
Platelets: 226 10*3/uL (ref 150–400)
RBC: 2.98 MIL/uL — AB (ref 4.22–5.81)
RDW: 18.8 % — AB (ref 11.5–15.5)
WBC: 3.6 10*3/uL — ABNORMAL LOW (ref 4.0–10.5)

## 2015-01-03 LAB — BRAIN NATRIURETIC PEPTIDE: B Natriuretic Peptide: 57 pg/mL (ref 0.0–100.0)

## 2015-01-03 LAB — URIC ACID: Uric Acid, Serum: 9.7 mg/dL — ABNORMAL HIGH (ref 4.0–7.8)

## 2015-01-03 MED ORDER — FUROSEMIDE 20 MG PO TABS: 20.0000 mg | ORAL_TABLET | Freq: Every day | ORAL | Status: DC

## 2015-01-03 MED ORDER — FUROSEMIDE 40 MG PO TABS
20.0000 mg | ORAL_TABLET | Freq: Every day | ORAL | Status: DC
  Administered 2015-01-03: 20 mg via ORAL
  Filled 2015-01-03: qty 1

## 2015-01-03 MED ORDER — HYDROCODONE-ACETAMINOPHEN 5-325 MG PO TABS
1.0000 | ORAL_TABLET | Freq: Once | ORAL | Status: AC
  Administered 2015-01-03: 1 via ORAL
  Filled 2015-01-03: qty 1

## 2015-01-03 MED ORDER — COLCHICINE 0.6 MG PO TABS
0.6000 mg | ORAL_TABLET | Freq: Every day | ORAL | Status: DC
Start: 2015-01-03 — End: 2015-01-20

## 2015-01-03 MED ORDER — POTASSIUM CHLORIDE ER 10 MEQ PO TBCR: 10.0000 meq | EXTENDED_RELEASE_TABLET | Freq: Once | ORAL | Status: DC

## 2015-01-03 MED ORDER — HYDROCODONE-ACETAMINOPHEN 5-325 MG PO TABS: 1.0000 | ORAL_TABLET | ORAL | Status: DC | PRN

## 2015-01-03 NOTE — ED Notes (Signed)
Hand edema times 2 weeks.  Feet started swelling one weeks ago.  Seen for same and given prescription for steroids.    Being treated for kidney cancer.

## 2015-01-03 NOTE — Discharge Instructions (Signed)
Compression stockings Elevate your legs whenever possible, avoid prolonged standing Stop the prednisone.   Edema Edema is an abnormal buildup of fluids. It is more common in your legs and thighs. Painless swelling of the feet and ankles is more likely as a person ages. It also is common in looser skin, like around your eyes. HOME CARE   Keep the affected body part above the level of the heart while lying down.  Do not sit still or stand for a long time.  Do not put anything right under your knees when you lie down.  Do not wear tight clothes on your upper legs.  Exercise your legs to help the puffiness (swelling) go down.  Wear elastic bandages or support stockings as told by your doctor.  A low-salt diet may help lessen the puffiness.  Only take medicine as told by your doctor. GET HELP IF:  Treatment is not working.  You have heart, liver, or kidney disease and notice that your skin looks puffy or shiny.  You have puffiness in your legs that does not get better when you raise your legs.  You have sudden weight gain for no reason. GET HELP RIGHT AWAY IF:   You have shortness of breath or chest pain.  You cannot breathe when you lie down.  You have pain, redness, or warmth in the areas that are puffy.  You have heart, liver, or kidney disease and get edema all of a sudden.  You have a fever and your symptoms get worse all of a sudden. MAKE SURE YOU:   Understand these instructions.  Will watch your condition.  Will get help right away if you are not doing well or get worse. Document Released: 02/20/2008 Document Revised: 09/08/2013 Document Reviewed: 06/26/2013 [facility] Patient Information 2015 [place], [place]. This information is not intended to replace advice given to you by your health care provider. Make sure you discuss any questions you have with your health care provider.

## 2015-01-03 NOTE — ED Provider Notes (Signed)
CSN: [id number]     Arrival date & time 01/03/15  2585 History  This chart was scribed for Jeffrey Furry, MD by Jeffrey Trujillo, ED Scribe. This patient was seen in room APA01/APA01 and the patient's care was started 10:09 AM.   Chief Complaint  Patient presents with  . Leg Swelling   HPI HPI Comments: Jeffrey Trujillo is a 64 y.o. male with a history of gout, kidney cancer and GERD who presents to the Emergency Department complaining of leg swelling for the last week. Pt was seen for hand swelling on 12/20/14 and was prescribed prednisone for 10 days that he discontinued after taking 40mg  the first day and 30mg  the second. He reports that he has had gout in his hands and feet previously but states his current symptoms are worse than his typical prior episodes. He states that he is experiencing tightness and discomfort in his legs but not pain. Pt states that his hand symptoms have improved slightly since the prednisone. Pt reports a history of right nephrectomy in 2010 due to CA. He states that he had his left lower lobe removed due to a cyst in the 1970s. Pt denies any heart problems or blood pressure issues. Pt states he takes votrient 400mg  daily for his current left kidney CA. He denies facial swelling, changes in urination or SOB.   Past Medical History  Diagnosis Date  . Gout   . DJD (degenerative joint disease) of cervical spine   . Diverticulosis of colon     by colonoscopy 11/02/2009  . Barrett's esophagus     by EGD 11/02/2009  . Anemia   . Cancer     "was told has cancer on other kidney and spot on pancreas"  . 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 GERD (gastroesophageal reflux disease)    Past Surgical History  Procedure Laterality Date  . Nephrectomy      right due to cancer  . Lung removal, partial      left lower lobe, benign  . Eus  08/07/2012    Procedure: UPPER ENDOSCOPIC ULTRASOUND (EUS) LINEAR;  Surgeon: [name], MD;  Location: [facility];  Service:  Endoscopy;  Laterality: N/A;  . Inguinal hernia repair    . Hernia repair    . Inguinal hernia repair      Right  . Eus  08/21/2012    Procedure: UPPER ENDOSCOPIC ULTRASOUND (EUS) LINEAR;  Surgeon: [name], MD;  Location: [facility];  Service: Endoscopy;  Laterality: N/A;  . Colonoscopy  Feb 2011    [name]: single anal papilla, pan-colonic diverticula  . Esophagogastroduodenoscopy  Feb 2011    [name]: salmon-colored epithelium consistent with Barrett's, nodular antral erosions, small hiatal hernia, negative H.pylori  . Colonoscopy  09/25/2012    RMR: Colonic diverticulosis. Suspect diverticular bleeding-hepatic flexure tic-sealed  . Esophagogastroduodenoscopy  09/25/2012    IDP:OEUMPNT'I esophagus. Small hiatal hernia. Antral erosions of doubtful clinical significance, no biopsies  . Esophagogastroduodenoscopy N/A 03/11/2013    SLF: Barrett's esophagus/Single ulcer in the gastric antrum-PROBABLE SOURCE FOR BLACK STOOL/ Chronic gastritis in the gastric antrum  . Esophagogastroduodenoscopy N/A 10/11/2014    Procedure: ESOPHAGOGASTRODUODENOSCOPY (EGD);  Surgeon: [name], MD;  [facility];  Service: Endoscopy;  Laterality: N/A;  1030    Family History  Problem Relation Age of Onset  . Lung cancer Brother   . Prostate cancer Brother   . Colon cancer Neg Hx   .  Lumbar disc disease Brother   . Prostate cancer Brother    History  Substance Use Topics  . Smoking status: Former Smoker -- 0.25 packs/day for 4 years    Types: Cigars  . Smokeless tobacco: Never Used     Comment: occasional cigar  . Alcohol Use: 1.2 - 1.8 oz/week    2-3 Cans of beer per week     Comment: vodka and budweiser, sometimes daily, depends on notion. couple drinks. 2-3 beers on a weekend.    Review of Systems  Constitutional: Negative for fever, chills, diaphoresis, appetite change and fatigue.  HENT: Negative for mouth sores, sore throat and trouble swallowing.   Eyes: Negative for  visual disturbance.  Respiratory: Negative for cough, chest tightness, shortness of breath and wheezing.   Cardiovascular: Positive for leg swelling. Negative for chest pain.  Gastrointestinal: Negative for nausea, vomiting, abdominal pain, diarrhea and abdominal distention.  Endocrine: Negative for polydipsia, polyphagia and polyuria.  Genitourinary: Negative for dysuria, frequency and hematuria.  Musculoskeletal: Negative for gait problem.  Skin: Negative for color change, pallor and rash.  Neurological: Negative for dizziness, syncope, light-headedness and headaches.  Hematological: Does not bruise/bleed easily.  Psychiatric/Behavioral: Negative for behavioral problems and confusion.    Allergies  Nsaids; Demerol; and Meperidine hcl  Home Medications   Prior to Admission medications   Medication Sig Start Date End Date Taking? Authorizing Provider  pantoprazole (PROTONIX) 40 MG tablet Take 1 tablet (40 mg total) by mouth 2 (two) times daily before a meal. 12/21/14  Yes [name], PA-C  pazopanib (VOTRIENT) 200 MG tablet TAKE 2 TABLETS (400MG  TOTAL) BY MOUTH DAILY ON AN EMPTY STOMACH 09/20/14  Yes [name], MD  polyethylene glycol (MIRALAX / GLYCOLAX) packet Take 17 g by mouth daily. Patient taking differently: Take 17 g by mouth daily as needed for mild constipation.  10/04/14  Yes Davonna Belling, MD  predniSONE (DELTASONE) 10 MG tablet Take 40mg  daily for 3 days, then 30mg  daily for 2 days, 20mg  daily for 2 days, 10 mg daily for 2 days, then 5mg  daily for 1 day. 09/24/14  Yes [name], PA-C  topiramate (TOPAMAX) 25 MG tablet Take 25 mg by mouth 2 (two) times daily.   Yes Historical Provider, MD  ciprofloxacin-dexamethasone (CIPRODEX) otic suspension Place 4 drops into the right ear 2 (two) times daily. Patient not taking: Reported on 01/03/2015 10/04/14   Davonna Belling, MD  colchicine 0.6 MG tablet Take 1 tablet (0.6 mg total) by mouth daily. 01/03/15   Jeffrey Furry, MD   furosemide (LASIX) 20 MG tablet Take 1 tablet (20 mg total) by mouth daily. 01/03/15   Jeffrey Furry, MD  HYDROcodone-acetaminophen (NORCO/VICODIN) 5-325 MG per tablet Take 1 tablet by mouth every 4 (four) hours as needed. 01/03/15   Jeffrey Furry, MD  hydrocortisone (ANUSOL-HC) 25 MG suppository Place 1 suppository (25 mg total) rectally 2 (two) times daily. Patient not taking: Reported on 01/03/2015 09/24/14   [name], PA-C  indomethacin (INDOCIN) 50 MG capsule Take 1 capsule (50 mg total) by mouth 2 (two) times daily with a meal. Patient not taking: Reported on 01/03/2015 04/15/14   Nita Sells, MD  megestrol (MEGACE) 400 MG/10ML suspension Take 10 mLs (400 mg total) by mouth 2 (two) times daily. Patient not taking: Reported on 01/03/2015 08/27/14   Carlton Adam, PA-C  potassium chloride (K-DUR) 10 MEQ tablet Take 1 tablet (10 mEq total) by mouth once. 01/03/15   Jeffrey Furry, MD  BP 138/82 mmHg  Pulse 77  Temp(Src) 98.2 F (36.8 C) (Oral)  Resp 18  Ht 5\' 11"  (1.803 m)  Wt 168 lb (76.204 kg)  BMI 23.44 kg/m2  SpO2 95% Physical Exam  Constitutional: He is oriented to person, place, and time. He appears well-developed and well-nourished. No distress.  HENT:  Head: Normocephalic.  Eyes: Conjunctivae are normal. Pupils are equal, round, and reactive to light. No scleral icterus.  Neck: Normal range of motion. Neck supple. No thyromegaly present.  Cardiovascular: Normal rate and regular rhythm.  Exam reveals no gallop and no friction rub.   No murmur heard. Pulmonary/Chest: Effort normal and breath sounds normal. No respiratory distress. He has no wheezes. He has no rales.  Abdominal: Soft. Bowel sounds are normal. He exhibits no distension. There is no tenderness. There is no rebound.  Musculoskeletal: Normal range of motion. He exhibits edema.  Soft tissue swelling DIP and PIP left middle finger. 2+ edema to bilateral lower legs. Trace of soft tissue swelling through left hand.    Neurological: He is alert and oriented to person, place, and time.  Skin: Skin is warm and dry. No rash noted.  Psychiatric: He has a normal mood and affect. His behavior is normal.  Nursing note and vitals reviewed.   ED Course  Procedures  DIAGNOSTIC STUDIES: Oxygen Saturation is 95% on RA, normal by my interpretation.    COORDINATION OF CARE: 10:23 AM Discussed treatment plan with pt at bedside and pt agreed to plan.   Labs Review Labs Reviewed  CBC WITH DIFFERENTIAL/PLATELET - Abnormal; Notable for the following:    WBC 3.6 (*)    RBC 2.98 (*)    Hemoglobin 9.6 (*)    HCT 30.7 (*)    MCV 103.0 (*)    RDW 18.8 (*)    Lymphs Abs 0.6 (*)    All other components within normal limits  COMPREHENSIVE METABOLIC PANEL - Abnormal; Notable for the following:    Potassium 3.2 (*)    Calcium 8.1 (*)    Albumin 2.9 (*)    GFR calc non Af Amer 65 (*)    GFR calc Af Amer 75 (*)    All other components within normal limits  URIC ACID - Abnormal; Notable for the following:    Uric Acid, Serum 9.7 (*)    All other components within normal limits  BRAIN NATRIURETIC PEPTIDE    Imaging Review Dg Chest 2 View  01/03/2015   CLINICAL DATA:  Lower extremity swelling, history of prior lobectomy  EXAM: CHEST  2 VIEW  COMPARISON:  07/12/2014  FINDINGS: Postoperative changes are again noted on the left. The cardiac shadow is stable. The right lung is clear. The residual left lung is clear.  IMPRESSION: No acute abnormality noted.   Electronically Signed   By: Inez Catalina M.D.   On: 01/03/2015 11:19   Dg Finger Middle Left  01/03/2015   CLINICAL DATA:  Pain and swelling for 1 week, no known injury, initial encounter  EXAM: LEFT MIDDLE FINGER 2+V  COMPARISON:  None.  FINDINGS: Mild soft tissue swelling is noted about the proximal interphalangeal joint. Degenerative changes are noted in both the proximal and distal interphalangeal joints. No acute fracture or dislocation is seen.  IMPRESSION:  Degenerative change with soft tissue swelling. No acute bony abnormality is seen.   Electronically Signed   By: Inez Catalina M.D.   On: 01/03/2015 11:20     EKG Interpretation None  MDM   Final diagnoses:  Edema  Medication side effect, initial encounter  Acute gout of left hand, unspecified cause    Patient's symptoms started after 6 days of high-dose prednisone. Has some pain in his legs. However clinically does not appear to be DVT. Liver, kidney function normal. Clinically and radiographically no signs of CHF. Plan will be stopping steroids. Daily Lasix and potassium, elevation, avoid prolonged standing. For his gout will treat with short dose colchicine, and simple pain control.  I personally performed the services described in this documentation, which was scribed in my presence. The recorded information has been reviewed and is accurate.      Jeffrey Furry, MD 01/03/15 1213

## 2015-01-17 ENCOUNTER — Encounter: Payer: Self-pay | Admitting: Gastroenterology

## 2015-01-17 ENCOUNTER — Other Ambulatory Visit: Payer: Self-pay

## 2015-01-17 ENCOUNTER — Ambulatory Visit (INDEPENDENT_AMBULATORY_CARE_PROVIDER_SITE_OTHER): Payer: 59 | Admitting: Gastroenterology

## 2015-01-17 VITALS — BP 130/74 | HR 72 | Temp 98.3°F | Ht 71.0 in | Wt 172.0 lb

## 2015-01-17 DIAGNOSIS — K2270 Barrett's esophagus without dysplasia: Secondary | ICD-10-CM | POA: Diagnosis not present

## 2015-01-17 NOTE — Patient Instructions (Signed)
We have scheduled you for a repeat upper endoscopy with [name] in the near future.  Continue Protonix twice a day as you are doing.   Have a great week!

## 2015-01-17 NOTE — Progress Notes (Signed)
Referring Provider: No ref. provider found Primary Care Physician:  PROVIDER NOT IN SYSTEM  Primary GI: [name]   Chief Complaint  Patient presents with  . Endoscopy    HPI:   Jeffrey Trujillo is a 64 y.o. male presenting today with a history of Barrett's esophagus, due for early interval surveillance due to glandular atypia on biopsies in Jan 2016, indefinite for low grade dysplasia. Recommendations were for 3 month surveillance.   Denies any breakthrough GERD symptoms. Protonix BID. If takes 2 cancer pills at a time has diarrhea. Therefore, takes it BID now. Has to have a CT scan tomorrow.  Denies dysphagia. No abdominal pain. No N/V.    Jan 2016 EGD by Dr. [name] barrett's s/p biopsy. Hiatal hernia. Biopsy with intestinal metaplasia, glandular atypia indefinite for low grade dysplasia noted as well  Past Medical History  Diagnosis Date  . Gout   . DJD (degenerative joint disease) of cervical spine   . Diverticulosis of colon     by colonoscopy 11/02/2009  . Barrett's esophagus     by EGD 11/02/2009  . Anemia   . Cancer     "was told has cancer on other kidney and spot on pancreas"  . 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 GERD (gastroesophageal reflux disease)     Past Surgical History  Procedure Laterality Date  . Nephrectomy      right due to cancer  . Lung removal, partial      left lower lobe, benign  . Eus  08/07/2012    Procedure: UPPER ENDOSCOPIC ULTRASOUND (EUS) LINEAR;  Surgeon: [name], MD;  Location: [facility];  Service: Endoscopy;  Laterality: N/A;  . Inguinal hernia repair    . Hernia repair    . Inguinal hernia repair      Right  . Eus  08/21/2012    Procedure: UPPER ENDOSCOPIC ULTRASOUND (EUS) LINEAR;  Surgeon: [name], MD;  Location: [facility];  Service: Endoscopy;  Laterality: N/A;  . Colonoscopy  Feb 2011    [name]: single anal papilla, pan-colonic diverticula  . Esophagogastroduodenoscopy   Feb 2011    [name]: salmon-colored epithelium consistent with Barrett's, nodular antral erosions, small hiatal hernia, negative H.pylori  . Colonoscopy  09/25/2012    RMR: Colonic diverticulosis. Suspect diverticular bleeding-hepatic flexure tic-sealed  . Esophagogastroduodenoscopy  09/25/2012    TXM:IWOEHOZ'Y esophagus. Small hiatal hernia. Antral erosions of doubtful clinical significance, no biopsies  . Esophagogastroduodenoscopy N/A 03/11/2013    SLF: Barrett's esophagus/Single ulcer in the gastric antrum-PROBABLE SOURCE FOR BLACK STOOL/ Chronic gastritis in the gastric antrum  . Esophagogastroduodenoscopy N/A 10/11/2014    Dr. [name] barrett's s/p biopsy. Hiatal hernia. Biopsy with intestinal metaplasia, glandular atypia indefinite for low grade dysplasia noted as well    Current Outpatient Prescriptions  Medication Sig Dispense Refill  . ciprofloxacin-dexamethasone (CIPRODEX) otic suspension Place 4 drops into the right ear 2 (two) times daily. 7.5 mL 0  . colchicine 0.6 MG tablet Take 1 tablet (0.6 mg total) by mouth daily. 6 tablet 0  . furosemide (LASIX) 20 MG tablet Take 1 tablet (20 mg total) by mouth daily. 10 tablet 0  . HYDROcodone-acetaminophen (NORCO/VICODIN) 5-325 MG per tablet Take 1 tablet by mouth every 4 (four) hours as needed. 10 tablet 0  . indomethacin (INDOCIN) 50 MG capsule Take 1 capsule (50 mg total) by mouth 2 (two) times daily with a meal.    .  megestrol (MEGACE) 400 MG/10ML suspension Take 10 mLs (400 mg total) by mouth 2 (two) times daily. 240 mL 0  . pantoprazole (PROTONIX) 40 MG tablet Take 1 tablet (40 mg total) by mouth 2 (two) times daily before a meal. 60 tablet 1  . pazopanib (VOTRIENT) 200 MG tablet TAKE 2 TABLETS (400MG  TOTAL) BY MOUTH DAILY ON AN EMPTY STOMACH 60 tablet 0  . topiramate (TOPAMAX) 25 MG tablet Take 25 mg by mouth 2 (two) times daily.     No current facility-administered medications for this visit.    Allergies as of  01/17/2015 - Review Complete 01/03/2015  Allergen Reaction Noted  . Nsaids Other (See Comments) 06/05/2011  . Demerol [meperidine] Rash 01/11/2014  . Meperidine hcl Hives and Rash     Family History  Problem Relation Age of Onset  . Lung cancer Brother   . Prostate cancer Brother   . Colon cancer Neg Hx   . Lumbar disc disease Brother   . Prostate cancer Brother     History   Social History  . Marital Status: Married    Spouse Name: N/A  . Number of Children: N/A  . Years of Education: N/A   Occupational History  .      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 Smoking status: Former Smoker -- 0.25 packs/day for 4 years    Types: Cigars  . Smokeless tobacco: Never Used     Comment: occasional cigar  . Alcohol Use: 1.2 - 1.8 oz/week    2-3 Cans of beer per week     Comment: vodka and budweiser, sometimes daily, depends on notion. couple drinks. 2-3 beers on a weekend.  . Drug Use: No  . Sexual Activity: Yes   Other Topics Concern  . None   Social History Narrative    Review of Systems: As mentioned in HPI  Physical Exam: BP 130/74 mmHg  Pulse 72  Temp(Src) 98.3 F (36.8 C) (Oral)  Ht 5\' 11"  (1.803 m)  Wt 172 lb (78.019 kg)  BMI 24.00 kg/m2 General:   Alert and oriented. No distress noted. Pleasant and cooperative.  Head:  Normocephalic and atraumatic. Eyes:  Conjuctiva clear without scleral icterus. Mouth:  Oral mucosa pink and moist. Good dentition. No lesions. Heart:  S1, S2 present without murmurs, rubs, or gallops. Regular rate and rhythm. Abdomen:  +BS, soft, non-tender and non-distended. No rebound or guarding. No HSM or masses noted. Msk:  Symmetrical without gross deformities. Normal posture. Extremities:  Without edema. Neurologic:  Alert and  oriented x4;  grossly normal neurologically. Skin:  Intact without significant lesions or rashes. Psych:  Alert and cooperative. Normal mood and affect.

## 2015-01-17 NOTE — Progress Notes (Signed)
No pcp per patient 

## 2015-01-17 NOTE — Assessment & Plan Note (Signed)
64 year old male with history of Barrett's esophagus and recent EGD in Jan 2016 noting glandular atypia indefinite for low grade dysplasia. Recommend 3 month early interval surveillance. No concerning upper or lower GI symptoms to note and remains on Protonix BID.   Proceed with upper endoscopy in the near future with [name]. The risks, benefits, and alternatives have been discussed in detail with patient. They have stated understanding and desire to proceed.  Phenergan 12.5 mg IV on call due to polypharmacy, ETOH use

## 2015-01-18 ENCOUNTER — Other Ambulatory Visit (HOSPITAL_BASED_OUTPATIENT_CLINIC_OR_DEPARTMENT_OTHER): Payer: 59

## 2015-01-18 ENCOUNTER — Ambulatory Visit (HOSPITAL_COMMUNITY)
Admission: RE | Admit: 2015-01-18 | Discharge: 2015-01-18 | Disposition: A | Discharge status: Home / Self Care | Payer: 59 | Source: Ambulatory Visit | Attending: Oncology | Admitting: Oncology

## 2015-01-18 ENCOUNTER — Encounter (HOSPITAL_COMMUNITY): Payer: Self-pay

## 2015-01-18 DIAGNOSIS — C642 Malignant neoplasm of left kidney, except renal pelvis: Secondary | ICD-10-CM

## 2015-01-18 DIAGNOSIS — Z905 Acquired absence of kidney: Secondary | ICD-10-CM | POA: Insufficient documentation

## 2015-01-18 DIAGNOSIS — C641 Malignant neoplasm of right kidney, except renal pelvis: Secondary | ICD-10-CM

## 2015-01-18 DIAGNOSIS — D649 Anemia, unspecified: Secondary | ICD-10-CM

## 2015-01-18 DIAGNOSIS — D6959 Other secondary thrombocytopenia: Secondary | ICD-10-CM | POA: Diagnosis not present

## 2015-01-18 DIAGNOSIS — Z85528 Personal history of other malignant neoplasm of kidney: Secondary | ICD-10-CM | POA: Diagnosis present

## 2015-01-18 DIAGNOSIS — C649 Malignant neoplasm of unspecified kidney, except renal pelvis: Secondary | ICD-10-CM

## 2015-01-18 LAB — CBC WITH DIFFERENTIAL/PLATELET
BASO%: 0.7 % (ref 0.0–2.0)
Basophils Absolute: 0 10*3/uL (ref 0.0–0.1)
EOS%: 2 % (ref 0.0–7.0)
Eosinophils Absolute: 0.1 10*3/uL (ref 0.0–0.5)
HCT: 25.1 % — ABNORMAL LOW (ref 38.4–49.9)
HGB: 8 g/dL — ABNORMAL LOW (ref 13.0–17.1)
LYMPH#: 0.5 10*3/uL — AB (ref 0.9–3.3)
LYMPH%: 17.6 % (ref 14.0–49.0)
MCH: 30.4 pg (ref 27.2–33.4)
MCHC: 31.7 g/dL — AB (ref 32.0–36.0)
MCV: 96.1 fL (ref 79.3–98.0)
MONO#: 0.4 10*3/uL (ref 0.1–0.9)
MONO%: 12.5 % (ref 0.0–14.0)
NEUT#: 2 10*3/uL (ref 1.5–6.5)
NEUT%: 67.2 % (ref 39.0–75.0)
Platelets: 266 10*3/uL (ref 140–400)
RBC: 2.62 10*6/uL — ABNORMAL LOW (ref 4.20–5.82)
RDW: 20.2 % — ABNORMAL HIGH (ref 11.0–14.6)
WBC: 2.9 10*3/uL — ABNORMAL LOW (ref 4.0–10.3)

## 2015-01-18 LAB — COMPREHENSIVE METABOLIC PANEL (CC13)
ALT: 8 U/L (ref 0–55)
ANION GAP: 9 meq/L (ref 3–11)
AST: 17 U/L (ref 5–34)
Albumin: 3 g/dL — ABNORMAL LOW (ref 3.5–5.0)
Alkaline Phosphatase: 90 U/L (ref 40–150)
BUN: 14.2 mg/dL (ref 7.0–26.0)
CALCIUM: 8.5 mg/dL (ref 8.4–10.4)
CO2: 23 mEq/L (ref 22–29)
CREATININE: 1.3 mg/dL (ref 0.7–1.3)
Chloride: 110 mEq/L — ABNORMAL HIGH (ref 98–109)
EGFR: 57 mL/min/{1.73_m2} — ABNORMAL LOW (ref >90)
Glucose: 96 mg/dl (ref 70–140)
Potassium: 4.6 mEq/L (ref 3.5–5.1)
Sodium: 142 mEq/L (ref 136–145)
TOTAL PROTEIN: 6.4 g/dL (ref 6.4–8.3)
Total Bilirubin: 0.61 mg/dL (ref 0.20–1.20)

## 2015-01-18 MED ORDER — IOHEXOL 300 MG/ML  SOLN
100.0000 mL | Freq: Once | INTRAMUSCULAR | Status: AC | PRN
  Administered 2015-01-18: 100 mL via INTRAVENOUS

## 2015-01-19 ENCOUNTER — Encounter (HOSPITAL_COMMUNITY)
Admission: RE | Disposition: A | Discharge status: Home / Self Care | Payer: Self-pay | Source: Ambulatory Visit | Attending: Internal Medicine

## 2015-01-19 ENCOUNTER — Encounter (HOSPITAL_COMMUNITY): Payer: Self-pay

## 2015-01-19 ENCOUNTER — Ambulatory Visit (HOSPITAL_COMMUNITY)
Admission: RE | Admit: 2015-01-19 | Discharge: 2015-01-19 | Disposition: A | Discharge status: Home / Self Care | Payer: 59 | Source: Ambulatory Visit | Attending: Internal Medicine | Admitting: Internal Medicine

## 2015-01-19 ENCOUNTER — Telehealth: Payer: Self-pay

## 2015-01-19 DIAGNOSIS — Z87891 Personal history of nicotine dependence: Secondary | ICD-10-CM | POA: Insufficient documentation

## 2015-01-19 DIAGNOSIS — Z905 Acquired absence of kidney: Secondary | ICD-10-CM | POA: Insufficient documentation

## 2015-01-19 DIAGNOSIS — K22710 Barrett's esophagus with low grade dysplasia: Secondary | ICD-10-CM | POA: Diagnosis not present

## 2015-01-19 DIAGNOSIS — M47892 Other spondylosis, cervical region: Secondary | ICD-10-CM | POA: Insufficient documentation

## 2015-01-19 DIAGNOSIS — Z79899 Other long term (current) drug therapy: Secondary | ICD-10-CM | POA: Insufficient documentation

## 2015-01-19 DIAGNOSIS — K219 Gastro-esophageal reflux disease without esophagitis: Secondary | ICD-10-CM | POA: Insufficient documentation

## 2015-01-19 DIAGNOSIS — Z792 Long term (current) use of antibiotics: Secondary | ICD-10-CM | POA: Diagnosis not present

## 2015-01-19 DIAGNOSIS — Z8507 Personal history of malignant neoplasm of pancreas: Secondary | ICD-10-CM | POA: Insufficient documentation

## 2015-01-19 DIAGNOSIS — M109 Gout, unspecified: Secondary | ICD-10-CM | POA: Diagnosis not present

## 2015-01-19 DIAGNOSIS — K2270 Barrett's esophagus without dysplasia: Secondary | ICD-10-CM | POA: Diagnosis present

## 2015-01-19 DIAGNOSIS — Z79891 Long term (current) use of opiate analgesic: Secondary | ICD-10-CM | POA: Diagnosis not present

## 2015-01-19 DIAGNOSIS — K449 Diaphragmatic hernia without obstruction or gangrene: Secondary | ICD-10-CM | POA: Insufficient documentation

## 2015-01-19 DIAGNOSIS — Z791 Long term (current) use of non-steroidal anti-inflammatories (NSAID): Secondary | ICD-10-CM | POA: Diagnosis not present

## 2015-01-19 DIAGNOSIS — Z85528 Personal history of other malignant neoplasm of kidney: Secondary | ICD-10-CM | POA: Diagnosis not present

## 2015-01-19 HISTORY — PX: ESOPHAGOGASTRODUODENOSCOPY: SHX5428

## 2015-01-19 SURGERY — EGD (ESOPHAGOGASTRODUODENOSCOPY)
Anesthesia: Moderate Sedation

## 2015-01-19 MED ORDER — ONDANSETRON HCL 4 MG/2ML IJ SOLN
INTRAMUSCULAR | Status: DC | PRN
  Administered 2015-01-19: 4 mg via INTRAVENOUS

## 2015-01-19 MED ORDER — MIDAZOLAM HCL 5 MG/5ML IJ SOLN
INTRAMUSCULAR | Status: AC
  Filled 2015-01-19: qty 5

## 2015-01-19 MED ORDER — SODIUM CHLORIDE 0.9 % IV SOLN
INTRAVENOUS | Status: DC
  Administered 2015-01-19: 13:00:00 via INTRAVENOUS

## 2015-01-19 MED ORDER — LIDOCAINE VISCOUS 2 % MT SOLN
OROMUCOSAL | Status: AC
  Filled 2015-01-19: qty 15

## 2015-01-19 MED ORDER — FENTANYL CITRATE (PF) 100 MCG/2ML IJ SOLN
INTRAMUSCULAR | Status: AC
  Filled 2015-01-19: qty 2

## 2015-01-19 MED ORDER — FENTANYL CITRATE (PF) 100 MCG/2ML IJ SOLN
INTRAMUSCULAR | Status: DC | PRN
  Administered 2015-01-19: 50 ug via INTRAVENOUS

## 2015-01-19 MED ORDER — PROMETHAZINE HCL 25 MG/ML IJ SOLN
12.5000 mg | Freq: Once | INTRAMUSCULAR | Status: AC
  Administered 2015-01-19: 12.5 mg via INTRAVENOUS
  Filled 2015-01-19: qty 1

## 2015-01-19 MED ORDER — ONDANSETRON HCL 4 MG/2ML IJ SOLN
INTRAMUSCULAR | Status: AC
  Filled 2015-01-19: qty 2

## 2015-01-19 MED ORDER — SODIUM CHLORIDE 0.9 % IJ SOLN
INTRAMUSCULAR | Status: AC
  Filled 2015-01-19: qty 3

## 2015-01-19 MED ORDER — MIDAZOLAM HCL 5 MG/5ML IJ SOLN
INTRAMUSCULAR | Status: DC | PRN
  Administered 2015-01-19: 2 mg via INTRAVENOUS

## 2015-01-19 NOTE — Telephone Encounter (Signed)
PA for EGD per pt insurance  PA# [id number] valid from 01/19/2015-04/19/2015

## 2015-01-19 NOTE — Discharge Instructions (Signed)
EGD Discharge instructions Please read the instructions outlined below and refer to this sheet in the next few weeks. These discharge instructions provide you with general information on caring for yourself after you leave the hospital. Your doctor may also give you specific instructions. While your treatment has been planned according to the most current medical practices available, unavoidable complications occasionally occur. If you have any problems or questions after discharge, please call your doctor. ACTIVITY  You may resume your regular activity but move at a slower pace for the next 24 hours.   Take frequent rest periods for the next 24 hours.   Walking will help expel (get rid of) the air and reduce the bloated feeling in your abdomen.   No driving for 24 hours (because of the anesthesia (medicine) used during the test).   You may shower.   Do not sign any important legal documents or operate any machinery for 24 hours (because of the anesthesia used during the test).  NUTRITION  Drink plenty of fluids.   You may resume your normal diet.   Begin with a light meal and progress to your normal diet.   Avoid alcoholic beverages for 24 hours or as instructed by your caregiver.  MEDICATIONS  You may resume your normal medications unless your caregiver tells you otherwise.  WHAT YOU CAN EXPECT TODAY  You may experience abdominal discomfort such as a feeling of fullness or gas pains.  FOLLOW-UP  Your doctor will discuss the results of your test with you.  SEEK IMMEDIATE MEDICAL ATTENTION IF ANY OF THE FOLLOWING OCCUR:  Excessive nausea (feeling sick to your stomach) and/or vomiting.   Severe abdominal pain and distention (swelling).   Trouble swallowing.   Temperature over 101 F (37.8 C).   Rectal bleeding or vomiting of blood.    GERD information  Continue Protonix 40 mg twice daily  Follow-up with Dr.Shadad regarding recent CT results  Further  recommendations to follow pending review of pathology report   Gastroesophageal Reflux Disease, Adult Gastroesophageal reflux disease (GERD) happens when acid from your stomach flows up into the esophagus. When acid comes in contact with the esophagus, the acid causes soreness (inflammation) in the esophagus. Over time, GERD may create small holes (ulcers) in the lining of the esophagus. CAUSES   Increased body weight. This puts pressure on the stomach, making acid rise from the stomach into the esophagus.  Smoking. This increases acid production in the stomach.  Drinking alcohol. This causes decreased pressure in the lower esophageal sphincter (valve or ring of muscle between the esophagus and stomach), allowing acid from the stomach into the esophagus.  Late evening meals and a full stomach. This increases pressure and acid production in the stomach.  A malformed lower esophageal sphincter. Sometimes, no cause is found. SYMPTOMS   Burning pain in the lower part of the mid-chest behind the breastbone and in the mid-stomach area. This may occur twice a week or more often.  Trouble swallowing.  Sore throat.  Dry cough.  Asthma-like symptoms including chest tightness, shortness of breath, or wheezing. DIAGNOSIS  Your caregiver may be able to diagnose GERD based on your symptoms. In some cases, X-rays and other tests may be done to check for complications or to check the condition of your stomach and esophagus. TREATMENT  Your caregiver may recommend over-the-counter or prescription medicines to help decrease acid production. Ask your caregiver before starting or adding any new medicines.  HOME CARE INSTRUCTIONS   Change the  factors that you can control. Ask your caregiver for guidance concerning weight loss, quitting smoking, and alcohol consumption.  Avoid foods and drinks that make your symptoms worse, such as:  Caffeine or alcoholic drinks.  Chocolate.  Peppermint or mint  flavorings.  Garlic and onions.  Spicy foods.  Citrus fruits, such as oranges, lemons, or limes.  Tomato-based foods such as sauce, chili, salsa, and pizza.  Fried and fatty foods.  Avoid lying down for the 3 hours prior to your bedtime or prior to taking a nap.  Eat small, frequent meals instead of large meals.  Wear loose-fitting clothing. Do not wear anything tight around your waist that causes pressure on your stomach.  Raise the head of your bed 6 to 8 inches with wood blocks to help you sleep. Extra pillows will not help.  Only take over-the-counter or prescription medicines for pain, discomfort, or fever as directed by your caregiver.  Do not take aspirin, ibuprofen, or other nonsteroidal anti-inflammatory drugs (NSAIDs). SEEK IMMEDIATE MEDICAL CARE IF:   You have pain in your arms, neck, jaw, teeth, or back.  Your pain increases or changes in intensity or duration.  You develop nausea, vomiting, or sweating (diaphoresis).  You develop shortness of breath, or you faint.  Your vomit is green, yellow, black, or looks like coffee grounds or blood.  Your stool is red, bloody, or black. These symptoms could be signs of other problems, such as heart disease, gastric bleeding, or esophageal bleeding. MAKE SURE YOU:   Understand these instructions.  Will watch your condition.  Will get help right away if you are not doing well or get worse. Document Released: 06/13/2005 Document Revised: 11/26/2011 Document Reviewed: 03/23/2011 [facility] Patient Information 2015 [place], [place]. This information is not intended to replace advice given to you by your health care provider. Make sure you discuss any questions you have with your health care provider.

## 2015-01-19 NOTE — Op Note (Signed)
[facility] [address] [place], [address]   ENDOSCOPY PROCEDURE REPORT  PATIENT: Jeffrey Trujillo, Jeffrey Trujillo  MR#: [id number] BIRTHDATE: 16-May-1951 , 37  yrs. old GENDER: male ENDOSCOPIST: R.  Garfield Cornea, MD [facility] REFERRED BY:  [name] PROCEDURE DATE:  2015-02-17 PROCEDURE:  EGD w/ biopsy INDICATIONS:  History of Barrett's esophagus; glandular atypia on biopsies from January of this year.Marland Kitchen MEDICATIONS: Versed 3 mg IV and fentanyl 50 g IV.  Phenergan 12.5 mg IV and Zofran 4 mg IV.  Xylocaine gel orally. ASA CLASS:      Class III  CONSENT: The risks, benefits, limitations, alternatives and imponderables have been discussed.  The potential for biopsy, esophogeal dilation, etc. have also been reviewed.  Questions have been answered.  All parties agreeable.  Please see the history and physical in the medical record for more information.  DESCRIPTION OF PROCEDURE: After the risks benefits and alternatives of the procedure were thoroughly explained, informed consent was obtained.  The EG-[id number] ([id number]) endoscope was introduced through the mouth and advanced to the second portion of the duodenum , limited by Without limitations. The instrument was slowly withdrawn as the mucosa was fully examined.    Mucosal changes consistent with previously diagnosed Barrett's esophagus identified from 33 cm to 36 cm from the incisors.  No esophagitis.  No nodularity.  Tubular esophagus from remains patent throughout its course stomach empty.  Normal gastric mucosa.  Small hiatal hernia.  Pain pylorus.  Normal first and second portion of the duodenum.  Circumferential biopsies of the esophagus taken at 36, 35, 34/33 cm from the incisors.  Retroflexed views revealed as previously described.     The scope was then withdrawn from the patient and the procedure completed.  COMPLICATIONS: There were no immediate complications.  ENDOSCOPIC IMPRESSION: Abnormal esophagus  consistent with Barrett's esophagus?"status post biopsy. Hiatal hernia.  Please note I reviewed the CT scan from yesterday. 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 Patient does note a painful "knot" in his buttock area from time to time; it is intermittent. I examined this area today and performed a digital rectal exam; nothing obvious found.  RECOMMENDATIONS: Continue Protonix 40 mg twice daily. Follow-up [name] as scheduled tomorrow to review all of pertinent CT findings. I will defer to [name] whether or not a pelvic MRI will be ordered. This approach would probably not be a bad idea. Patient describes a chronic perirectal abscess.   Follow-up on pathology.  REPEAT EXAM:  eSigned:  R. Garfield Cornea, MD Rosalita Chessman [facility] 2015/02/17 2:40 PM    CC:  CPT CODES: ICD CODES:  The ICD and CPT codes recommended by this software are interpretations from the data that the clinical staff has captured with the software.  The verification of the translation of this report to the ICD and CPT codes and modifiers is the sole responsibility of the health care institution and practicing physician where this report was generated.  [employer] will not be held responsible for the validity of the ICD and CPT codes included on this report.  AMA assumes no liability for data contained or not contained herein. CPT is a Designer, television/film set of the [employer].  PATIENT NAME:  Leonidas, Boateng MR#: [id number]

## 2015-01-19 NOTE — Interval H&P Note (Signed)
History and Physical Interval Note:  01/19/2015 2:04 PM  Jeffrey Trujillo  has presented today for surgery, with the diagnosis of barretts esophagus  The various methods of treatment have been discussed with the patient and family. After consideration of risks, benefits and other options for treatment, the patient has consented to  Procedure(s) with comments: ESOPHAGOGASTRODUODENOSCOPY (EGD) (N/A) - 200 as a surgical intervention .  The patient's history has been reviewed, patient examined, no change in status, stable for surgery.  I have reviewed the patient's chart and labs.  Questions were answered to the patient's satisfaction.     Robert Rourk  No dysphagia. GERD well controlled. EGD per plan.  The risks, benefits, limitations, alternatives and imponderables have been reviewed with the patient. Potential for esophageal dilation, biopsy, etc. have also been reviewed.  Questions have been answered. All parties agreeable.

## 2015-01-19 NOTE — H&P (View-Only) (Signed)
Referring Provider: No ref. provider found Primary Care Physician:  PROVIDER NOT IN SYSTEM  Primary GI: [name]   Chief Complaint  Patient presents with  . Endoscopy    HPI:   Jeffrey Trujillo is a 64 y.o. male presenting today with a history of Barrett's esophagus, due for early interval surveillance due to glandular atypia on biopsies in Jan 2016, indefinite for low grade dysplasia. Recommendations were for 3 month surveillance.   Denies any breakthrough GERD symptoms. Protonix BID. If takes 2 cancer pills at a time has diarrhea. Therefore, takes it BID now. Has to have a CT scan tomorrow.  Denies dysphagia. No abdominal pain. No N/V.    Jan 2016 EGD by Dr. [name] barrett's s/p biopsy. Hiatal hernia. Biopsy with intestinal metaplasia, glandular atypia indefinite for low grade dysplasia noted as well  Past Medical History  Diagnosis Date  . Gout   . DJD (degenerative joint disease) of cervical spine   . Diverticulosis of colon     by colonoscopy 11/02/2009  . Barrett's esophagus     by EGD 11/02/2009  . Anemia   . Cancer     "was told has cancer on other kidney and spot on pancreas"  . 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 GERD (gastroesophageal reflux disease)     Past Surgical History  Procedure Laterality Date  . Nephrectomy      right due to cancer  . Lung removal, partial      left lower lobe, benign  . Eus  08/07/2012    Procedure: UPPER ENDOSCOPIC ULTRASOUND (EUS) LINEAR;  Surgeon: [name], MD;  Location: [facility] ENDOSCOPY;  Service: Endoscopy;  Laterality: N/A;  . Inguinal hernia repair    . Hernia repair    . Inguinal hernia repair      Right  . Eus  08/21/2012    Procedure: UPPER ENDOSCOPIC ULTRASOUND (EUS) LINEAR;  Surgeon: [name], MD;  Location: [facility] ENDOSCOPY;  Service: Endoscopy;  Laterality: N/A;  . Colonoscopy  Feb 2011    [name]: single anal papilla, pan-colonic diverticula  . Esophagogastroduodenoscopy   Feb 2011    [name]: salmon-colored epithelium consistent with Barrett's, nodular antral erosions, small hiatal hernia, negative H.pylori  . Colonoscopy  09/25/2012    RMR: Colonic diverticulosis. Suspect diverticular bleeding-hepatic flexure tic-sealed  . Esophagogastroduodenoscopy  09/25/2012    TSV:[id number] esophagus. Small hiatal hernia. Antral erosions of doubtful clinical significance, no biopsies  . Esophagogastroduodenoscopy N/A 03/11/2013    SLF: Barrett's esophagus/Single ulcer in the gastric antrum-PROBABLE SOURCE FOR BLACK STOOL/ Chronic gastritis in the gastric antrum  . Esophagogastroduodenoscopy N/A 10/11/2014    Dr. [name] barrett's s/p biopsy. Hiatal hernia. Biopsy with intestinal metaplasia, glandular atypia indefinite for low grade dysplasia noted as well    Current Outpatient Prescriptions  Medication Sig Dispense Refill  . ciprofloxacin-dexamethasone (CIPRODEX) otic suspension Place 4 drops into the right ear 2 (two) times daily. 7.5 mL 0  . colchicine 0.6 MG tablet Take 1 tablet (0.6 mg total) by mouth daily. 6 tablet 0  . furosemide (LASIX) 20 MG tablet Take 1 tablet (20 mg total) by mouth daily. 10 tablet 0  . HYDROcodone-acetaminophen (NORCO/VICODIN) 5-325 MG per tablet Take 1 tablet by mouth every 4 (four) hours as needed. 10 tablet 0  . indomethacin (INDOCIN) 50 MG capsule Take 1 capsule (50 mg total) by mouth 2 (two) times daily with a meal.    .  megestrol (MEGACE) 400 MG/10ML suspension Take 10 mLs (400 mg total) by mouth 2 (two) times daily. 240 mL 0  . pantoprazole (PROTONIX) 40 MG tablet Take 1 tablet (40 mg total) by mouth 2 (two) times daily before a meal. 60 tablet 1  . pazopanib (VOTRIENT) 200 MG tablet TAKE 2 TABLETS (400MG  TOTAL) BY MOUTH DAILY ON AN EMPTY STOMACH 60 tablet 0  . topiramate (TOPAMAX) 25 MG tablet Take 25 mg by mouth 2 (two) times daily.     No current facility-administered medications for this visit.    Allergies as of  01/17/2015 - Review Complete 01/03/2015  Allergen Reaction Noted  . Nsaids Other (See Comments) 06/05/2011  . Demerol [meperidine] Rash 01/11/2014  . Meperidine hcl Hives and Rash     Family History  Problem Relation Age of Onset  . Lung cancer Brother   . Prostate cancer Brother   . Colon cancer Neg Hx   . Lumbar disc disease Brother   . Prostate cancer Brother     History   Social History  . Marital Status: Married    Spouse Name: N/A  . Number of Children: N/A  . Years of Education: N/A   Occupational History  .      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 Smoking status: Former Smoker -- 0.25 packs/day for 4 years    Types: Cigars  . Smokeless tobacco: Never Used     Comment: occasional cigar  . Alcohol Use: 1.2 - 1.8 oz/week    2-3 Cans of beer per week     Comment: vodka and budweiser, sometimes daily, depends on notion. couple drinks. 2-3 beers on a weekend.  . Drug Use: No  . Sexual Activity: Yes   Other Topics Concern  . None   Social History Narrative    Review of Systems: As mentioned in HPI  Physical Exam: BP 130/74 mmHg  Pulse 72  Temp(Src) 98.3 F (36.8 C) (Oral)  Ht 5\' 11"  (1.803 m)  Wt 172 lb (78.019 kg)  BMI 24.00 kg/m2 General:   Alert and oriented. No distress noted. Pleasant and cooperative.  Head:  Normocephalic and atraumatic. Eyes:  Conjuctiva clear without scleral icterus. Mouth:  Oral mucosa pink and moist. Good dentition. No lesions. Heart:  S1, S2 present without murmurs, rubs, or gallops. Regular rate and rhythm. Abdomen:  +BS, soft, non-tender and non-distended. No rebound or guarding. No HSM or masses noted. Msk:  Symmetrical without gross deformities. Normal posture. Extremities:  Without edema. Neurologic:  Alert and  oriented x4;  grossly normal neurologically. Skin:  Intact without significant lesions or rashes. Psych:  Alert and cooperative. Normal mood and affect.

## 2015-01-20 ENCOUNTER — Ambulatory Visit (HOSPITAL_BASED_OUTPATIENT_CLINIC_OR_DEPARTMENT_OTHER): Payer: 59 | Admitting: Oncology

## 2015-01-20 ENCOUNTER — Telehealth: Payer: Self-pay | Admitting: Oncology

## 2015-01-20 VITALS — BP 164/74 | HR 56 | Temp 98.0°F | Resp 18 | Ht 71.0 in | Wt 171.8 lb

## 2015-01-20 DIAGNOSIS — C641 Malignant neoplasm of right kidney, except renal pelvis: Secondary | ICD-10-CM

## 2015-01-20 DIAGNOSIS — D696 Thrombocytopenia, unspecified: Secondary | ICD-10-CM | POA: Diagnosis not present

## 2015-01-20 DIAGNOSIS — C649 Malignant neoplasm of unspecified kidney, except renal pelvis: Secondary | ICD-10-CM

## 2015-01-20 DIAGNOSIS — C642 Malignant neoplasm of left kidney, except renal pelvis: Secondary | ICD-10-CM | POA: Diagnosis not present

## 2015-01-20 DIAGNOSIS — C7989 Secondary malignant neoplasm of other specified sites: Secondary | ICD-10-CM

## 2015-01-20 DIAGNOSIS — D649 Anemia, unspecified: Secondary | ICD-10-CM

## 2015-01-20 NOTE — Telephone Encounter (Signed)
per pof to sch pt appt-gave pt copy of sch °

## 2015-01-20 NOTE — Progress Notes (Signed)
Hematology and Oncology Follow Up Visit  Jeffrey Trujillo [id number] 08-Nov-1950     Principle Diagnosis: 64 year old with the following issues:  1. T1b right kidney cancer diagnosed in 2010. He is S/P rdical nephrectomy on June 27, 2009. The pathological staging was T1b clear cell histology with Fuhrman grade 3/4.  2. Left Kidney mass  Measuring 4.5 x 2 7 x 3.6 irregular enhancing appears to be a new kidney cancer noted in 07/2012. 3. Pancreatic lesion. 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  Current therapy: Votrient  800 mg daily started in December 2013. Treatment held in 11/2012 due to increase LFTs. Votrient was resumed in April 2014 at 400 mg daily.  Interim History: Jeffrey Trujillo presents for a follow up visit by him self. Since his last visit, he underwent an endoscopy without any complications. He continues to tolerate the current dose without any complications. He does report loose bowel movements about 2-3 times a day but no diarrhea or hematochezia. He does report mild fatigue as well. He continues to work full time but will be cut down to part time. He'll likely lose his insurance and will need assistance regarding this medication motor forward. He does not report any nausea or vomiting. Does not report any dysphasia or difficulty with swallowing. He denies chest pain, shortness of breath, abdominal pain,  No rashes but continues to have easy bruising.  He continues to work full-time without any difficulty at this time. He has not reported any constitutional symptom. He did not report any headaches blurred vision double vision. Does not report any motor sensory neuropathy or alteration of mental status. He does not report any depression or anxiety. He does not report any frequency urgency or hesitancy. He does not report any other urinary symptoms.   Does not report any lymphadenopathy or any constitutional symptoms. He does report some difficulty sleeping. Remainder of  his review of systems is unremarkable.  Medications: Unchanged by my review today. Current Outpatient Prescriptions  Medication Sig Dispense Refill  . pantoprazole (PROTONIX) 40 MG tablet Take 1 tablet (40 mg total) by mouth 2 (two) times daily before a meal. 60 tablet 1  . pazopanib (VOTRIENT) 200 MG tablet TAKE 2 TABLETS (400MG  TOTAL) BY MOUTH DAILY ON AN EMPTY STOMACH 60 tablet 0  . topiramate (TOPAMAX) 25 MG tablet Take 25 mg by mouth 2 (two) times daily.     No current facility-administered medications for this visit.     Allergies:  Allergies  Allergen Reactions  . Nsaids Other (See Comments)    Patient only has one kidney.   . Meperidine Hcl Hives and Rash    Past Medical History, Surgical history, Social history, and Family History were reviewed and updated.   Physical Exam: Blood pressure 164/74, pulse 56, temperature 98 F (36.7 C), temperature source Oral, resp. rate 18, height 5\' 11"  (1.803 m), weight 171 lb 12.8 oz (77.928 kg), SpO2 100 %. ECOG: 1 General appearance: alert and awake appeared in no active distress. Head: Normocephalic, without obvious abnormality.  Neck: no adenopathy, no masses Lymph nodes: Cervical, supraclavicular, and axillary nodes normal. Heart:regular rate and rhythm, S1, S2.  Lung:chest clear, no wheezing, or dullness to percussion. Abdomen: soft, non-tender, without masses or organomegaly no shifting dullness or ascites EXT:no erythema, induration, or nodules. No edema. Neurological exam: no motor or sensory deficits noted. Skin: No rashes or lesions noted. Ecchymosis noted on his upper extremities. Neurological: Intact no deficits.  CBC  Component Value Date/Time   WBC 2.9* 01/18/2015 1332   WBC 3.6* 01/03/2015 1043   RBC 2.62* 01/18/2015 1332   RBC 2.98* 01/03/2015 1043   HGB 8.0* 01/18/2015 1332   HGB 9.6* 01/03/2015 1043   HCT 25.1* 01/18/2015 1332   HCT 30.7* 01/03/2015 1043   PLT 266 01/18/2015 1332   PLT 226 01/03/2015  1043   MCV 96.1 01/18/2015 1332   MCV 103.0* 01/03/2015 1043   MCH 30.4 01/18/2015 1332   MCH 32.2 01/03/2015 1043   MCHC 31.7* 01/18/2015 1332   MCHC 31.3 01/03/2015 1043   RDW 20.2* 01/18/2015 1332   RDW 18.8* 01/03/2015 1043   LYMPHSABS 0.5* 01/18/2015 1332   LYMPHSABS 0.6* 01/03/2015 1043   MONOABS 0.4 01/18/2015 1332   MONOABS 0.2 01/03/2015 1043   EOSABS 0.1 01/18/2015 1332   EOSABS 0.1 01/03/2015 1043   BASOSABS 0.0 01/18/2015 1332   BASOSABS 0.0 01/03/2015 1043   EXAM: CT CHEST, ABDOMEN, AND PELVIS WITH CONTRAST  TECHNIQUE: Multidetector CT imaging of the chest, abdomen and pelvis was performed following the standard protocol during bolus administration of intravenous contrast.  CONTRAST: 135mL OMNIPAQUE IOHEXOL 300 MG/ML SOLN  COMPARISON: 07/28/2014  FINDINGS: CT CHEST FINDINGS  Chest wall: No chest wall mass, supraclavicular or axillary lymphadenopathy. The thyroid gland is grossly normal. The bony thorax is intact. No destructive bone lesions or spinal canal compromise.  Mediastinum: The heart is borderline enlarged but stable. No pericardial effusion. Small scattered mediastinal and hilar lymph nodes but no mass or adenopathy. The aorta is stable in caliber. No dissection. Stable mild fusiform dilatation of the ascending aorta with maximum measurement of 3.7 cm. The esophagus is grossly normal.  Lungs/ pleura: No worrisome pulmonary lesions to suggest pulmonary metastatic disease. No acute pulmonary findings. Stable scarring changes in the left lung with evidence of remote trauma. No pleural effusion.  CT ABDOMEN AND PELVIS FINDINGS  Hepatobiliary: Stable low-attenuation medial segment left hepatic lobe liver lesion most consistent with a benign cyst. No worrisome hepatic lesions or intrahepatic biliary dilatation. The gallbladder is mildly contracted. No common bile duct dilatation.  Pancreas: Normal.  Spleen:  Normal.  Adrenals/Urinary Tract: Enlarging solid enhancing left renal mass. It measures 4.6 x 4.5 cm and previously measured 3.3 x 3.0 cm. No renal vein involvement. No adenopathy. The right kidney is surgically absent. Both adrenal glands appear normal. Stable upper pole left renal cyst.  Stomach/Bowel: The stomach, duodenum, small bowel and colon are unremarkable. No inflammatory changes, mass lesions or obstructive findings. Stable colonic diverticulosis without findings for acute diverticulitis. 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 This was present on the prior study but may be more extensive now. MRI pelvis without and with contrast may be helpful for further evaluation.  Vascular/Lymphatic: No mesenteric or retroperitoneal mass or adenopathy. The aorta and branch vessels are patent. The major venous structures are patent.  Other: The bladder, prostate gland and seminal vesicles are grossly normal. No pelvic mass or pelvic adenopathy. No inguinal adenopathy.  Musculoskeletal: No significant bony findings.  IMPRESSION: 1. Enlarging left renal mass as described above. 2. Status post right nephrectomy. 3. No CT findings to suggest metastatic disease involving the chest, abdomen or pelvis. 4. 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 MRI pelvis without and with contrast may be helpful for further evaluation.   Impression and Plan:   A 64 year old gentleman with the following issues:  1. A history of renal cell carcinoma. He had stage T1b diagnosed in October 2010 of the right kidney, status post right nephrectomy. He also has left kidney cancer and pancreatic metastasis. He is currently on Votrient without any new complications.   CT scan from 01/18/2015 was reviewed today and showed stable disease. His left renal mass is  slightly enlarged but not dramatically and for the most part his disease under reasonable control. The plan is to continue with the same dose and schedule moving forward. We will switch to a different salvage therapy with disease progression.  2. Pancreatic lesion. Biopsy proven to be malignant and likely represent renal cell cancer. Regressed on Votrient at his last CT scan did not show any evidence of that at this time.  3. Thrombocytopenia: This is related to Votrient and has been improved and stable recently.  4. Anemia. Hgb has been fluctuating but has not really needing any transfusion. We'll continue to monitor this closely.  5. Increase LFTs: back to normal now. Will continue to follow this.   6. Weight loss: weight is improving since the last visit.  7. Follow-up: In 6 weeks.  [name] MD 01/20/2015

## 2015-01-21 ENCOUNTER — Encounter (HOSPITAL_COMMUNITY): Payer: Self-pay | Admitting: Internal Medicine

## 2015-01-21 ENCOUNTER — Telehealth: Payer: Self-pay

## 2015-01-21 NOTE — Telephone Encounter (Signed)
Discussed with Melanie RN in endoscopy. Did not have any any issues when he left endoscopy 2 days ago.. Had a little bruising yesterday reported but now skin red in same area today. Given his history of malignancy and potential for immunosuppression,I would be concerned about phlebitis. Less likely chemical/ medication in origin as symptoms did not develop until a good 24 hours following the procedure. May have a bacterial phlebitis. Patient should see his primary care physician today for further evaluation.

## 2015-01-21 NOTE — Telephone Encounter (Signed)
Called to check on pt. LMOM that we were just checking on him.

## 2015-01-21 NOTE — Telephone Encounter (Signed)
I called and LMOM that [name] said to make sure that he sees PCP today and to please call back and let us know .

## 2015-01-21 NOTE — Telephone Encounter (Signed)
Pt's wife called and said that the pt had bad bruising from the IV yesterday when he had his endoscopy. She said he bruises easily anyway. However, the bruising is going up his arm and the area is outlined in red. I spoke to Laban Emperor, NP, and she said that he should check with his primary care physician, it could be something totally unrelated. But she did advise if pt starts running a fever or the area spreads or worsens to go to the ED. Pt's wife is aware of the recommendations.

## 2015-01-21 NOTE — Telephone Encounter (Signed)
We also need to let Threasa Beards know in case it is related to the IV. Please have him keep [place] updated.

## 2015-01-24 ENCOUNTER — Encounter: Payer: Self-pay | Admitting: Internal Medicine

## 2015-01-24 ENCOUNTER — Telehealth: Payer: Self-pay

## 2015-01-24 NOTE — Telephone Encounter (Signed)
ON RECALL LIST  °

## 2015-01-24 NOTE — Telephone Encounter (Signed)
Per RMR-  Send letter to patient.  Send copy of letter with path to referring provider and PCP. Offer office visit in one year. I'm not sure that future surveillance EGDs are in his best interest given his other medical problems at this time. We'll see him back in one year and see how things are going

## 2015-01-24 NOTE — Telephone Encounter (Signed)
Letter mailed to the pt. 

## 2015-02-11 ENCOUNTER — Encounter (HOSPITAL_COMMUNITY): Payer: Self-pay | Admitting: *Deleted

## 2015-02-11 ENCOUNTER — Inpatient Hospital Stay (HOSPITAL_COMMUNITY)
Admission: EM | Admit: 2015-02-11 | Discharge: 2015-02-15 | DRG: 445 | Disposition: A | Discharge status: Home / Self Care | Payer: 59 | Attending: Internal Medicine | Admitting: Internal Medicine

## 2015-02-11 ENCOUNTER — Emergency Department (HOSPITAL_COMMUNITY): Payer: 59

## 2015-02-11 DIAGNOSIS — Z9221 Personal history of antineoplastic chemotherapy: Secondary | ICD-10-CM | POA: Diagnosis not present

## 2015-02-11 DIAGNOSIS — Z801 Family history of malignant neoplasm of trachea, bronchus and lung: Secondary | ICD-10-CM | POA: Diagnosis not present

## 2015-02-11 DIAGNOSIS — K219 Gastro-esophageal reflux disease without esophagitis: Secondary | ICD-10-CM | POA: Diagnosis present

## 2015-02-11 DIAGNOSIS — C7889 Secondary malignant neoplasm of other digestive organs: Secondary | ICD-10-CM | POA: Diagnosis present

## 2015-02-11 DIAGNOSIS — N179 Acute kidney failure, unspecified: Secondary | ICD-10-CM | POA: Diagnosis present

## 2015-02-11 DIAGNOSIS — K819 Cholecystitis, unspecified: Secondary | ICD-10-CM | POA: Diagnosis not present

## 2015-02-11 DIAGNOSIS — M1990 Unspecified osteoarthritis, unspecified site: Secondary | ICD-10-CM | POA: Diagnosis present

## 2015-02-11 DIAGNOSIS — R7989 Other specified abnormal findings of blood chemistry: Secondary | ICD-10-CM | POA: Diagnosis present

## 2015-02-11 DIAGNOSIS — D62 Acute posthemorrhagic anemia: Secondary | ICD-10-CM | POA: Diagnosis present

## 2015-02-11 DIAGNOSIS — D638 Anemia in other chronic diseases classified elsewhere: Secondary | ICD-10-CM | POA: Diagnosis present

## 2015-02-11 DIAGNOSIS — T451X5A Adverse effect of antineoplastic and immunosuppressive drugs, initial encounter: Secondary | ICD-10-CM | POA: Diagnosis present

## 2015-02-11 DIAGNOSIS — Z87891 Personal history of nicotine dependence: Secondary | ICD-10-CM

## 2015-02-11 DIAGNOSIS — D500 Iron deficiency anemia secondary to blood loss (chronic): Secondary | ICD-10-CM | POA: Diagnosis not present

## 2015-02-11 DIAGNOSIS — Z8042 Family history of malignant neoplasm of prostate: Secondary | ICD-10-CM | POA: Diagnosis not present

## 2015-02-11 DIAGNOSIS — E876 Hypokalemia: Secondary | ICD-10-CM | POA: Diagnosis present

## 2015-02-11 DIAGNOSIS — K8020 Calculus of gallbladder without cholecystitis without obstruction: Secondary | ICD-10-CM | POA: Insufficient documentation

## 2015-02-11 DIAGNOSIS — R740 Nonspecific elevation of levels of transaminase and lactic acid dehydrogenase [LDH]: Secondary | ICD-10-CM | POA: Diagnosis present

## 2015-02-11 DIAGNOSIS — R509 Fever, unspecified: Secondary | ICD-10-CM | POA: Diagnosis present

## 2015-02-11 DIAGNOSIS — R1011 Right upper quadrant pain: Secondary | ICD-10-CM | POA: Diagnosis present

## 2015-02-11 DIAGNOSIS — K8012 Calculus of gallbladder with acute and chronic cholecystitis without obstruction: Principal | ICD-10-CM | POA: Diagnosis present

## 2015-02-11 DIAGNOSIS — R001 Bradycardia, unspecified: Secondary | ICD-10-CM | POA: Diagnosis not present

## 2015-02-11 DIAGNOSIS — Z905 Acquired absence of kidney: Secondary | ICD-10-CM | POA: Diagnosis present

## 2015-02-11 DIAGNOSIS — R17 Unspecified jaundice: Secondary | ICD-10-CM

## 2015-02-11 DIAGNOSIS — E440 Moderate protein-calorie malnutrition: Secondary | ICD-10-CM | POA: Diagnosis present

## 2015-02-11 DIAGNOSIS — Z6823 Body mass index (BMI) 23.0-23.9, adult: Secondary | ICD-10-CM | POA: Diagnosis not present

## 2015-02-11 DIAGNOSIS — R945 Abnormal results of liver function studies: Secondary | ICD-10-CM

## 2015-02-11 DIAGNOSIS — C649 Malignant neoplasm of unspecified kidney, except renal pelvis: Secondary | ICD-10-CM | POA: Diagnosis present

## 2015-02-11 DIAGNOSIS — Z85528 Personal history of other malignant neoplasm of kidney: Secondary | ICD-10-CM | POA: Diagnosis not present

## 2015-02-11 DIAGNOSIS — C642 Malignant neoplasm of left kidney, except renal pelvis: Secondary | ICD-10-CM | POA: Diagnosis present

## 2015-02-11 DIAGNOSIS — K2270 Barrett's esophagus without dysplasia: Secondary | ICD-10-CM | POA: Diagnosis present

## 2015-02-11 LAB — COMPREHENSIVE METABOLIC PANEL
ALT: 55 U/L (ref 17–63)
AST: 168 U/L — ABNORMAL HIGH (ref 15–41)
Albumin: 3 g/dL — ABNORMAL LOW (ref 3.5–5.0)
Alkaline Phosphatase: 344 U/L — ABNORMAL HIGH (ref 38–126)
Anion gap: 8 (ref 5–15)
BUN: 19 mg/dL (ref 6–20)
CO2: 20 mmol/L — ABNORMAL LOW (ref 22–32)
Calcium: 8.4 mg/dL — ABNORMAL LOW (ref 8.9–10.3)
Chloride: 110 mmol/L (ref 101–111)
Creatinine, Ser: 1.42 mg/dL — ABNORMAL HIGH (ref 0.61–1.24)
GFR calc Af Amer: 59 mL/min — ABNORMAL LOW (ref >60)
GFR calc non Af Amer: 51 mL/min — ABNORMAL LOW (ref >60)
Glucose, Bld: 106 mg/dL — ABNORMAL HIGH (ref 65–99)
Potassium: 3.2 mmol/L — ABNORMAL LOW (ref 3.5–5.1)
Sodium: 138 mmol/L (ref 135–145)
Total Bilirubin: 1.3 mg/dL — ABNORMAL HIGH (ref 0.3–1.2)
Total Protein: 6.7 g/dL (ref 6.5–8.1)

## 2015-02-11 LAB — URINALYSIS, ROUTINE W REFLEX MICROSCOPIC
Bilirubin Urine: NEGATIVE
Glucose, UA: NEGATIVE mg/dL
Hgb urine dipstick: NEGATIVE
Ketones, ur: NEGATIVE mg/dL
Leukocytes, UA: NEGATIVE
Nitrite: NEGATIVE
Protein, ur: NEGATIVE mg/dL
Specific Gravity, Urine: 1.02 (ref 1.005–1.030)
Urobilinogen, UA: 4 mg/dL — ABNORMAL HIGH (ref 0.0–1.0)
pH: 6 (ref 5.0–8.0)

## 2015-02-11 LAB — CBC WITH DIFFERENTIAL/PLATELET
Basophils Absolute: 0 10*3/uL (ref 0.0–0.1)
Basophils Relative: 1 % (ref 0–1)
Eosinophils Absolute: 0 10*3/uL (ref 0.0–0.7)
Eosinophils Relative: 1 % (ref 0–5)
HCT: 24 % — ABNORMAL LOW (ref 39.0–52.0)
Hemoglobin: 7.8 g/dL — ABNORMAL LOW (ref 13.0–17.0)
Lymphocytes Relative: 6 % — ABNORMAL LOW (ref 12–46)
Lymphs Abs: 0.3 10*3/uL — ABNORMAL LOW (ref 0.7–4.0)
MCH: 31.7 pg (ref 26.0–34.0)
MCHC: 32.5 g/dL (ref 30.0–36.0)
MCV: 97.6 fL (ref 78.0–100.0)
Monocytes Absolute: 0.3 10*3/uL (ref 0.1–1.0)
Monocytes Relative: 7 % (ref 3–12)
Neutro Abs: 3.7 10*3/uL (ref 1.7–7.7)
Neutrophils Relative %: 85 % — ABNORMAL HIGH (ref 43–77)
Platelets: 200 10*3/uL (ref 150–400)
RBC: 2.46 MIL/uL — ABNORMAL LOW (ref 4.22–5.81)
RDW: 17.6 % — ABNORMAL HIGH (ref 11.5–15.5)
WBC: 4.3 10*3/uL (ref 4.0–10.5)

## 2015-02-11 LAB — TROPONIN I

## 2015-02-11 LAB — I-STAT CG4 LACTIC ACID, ED: LACTIC ACID, VENOUS: 0.94 mmol/L (ref 0.5–2.0)

## 2015-02-11 LAB — POC OCCULT BLOOD, ED: Fecal Occult Bld: POSITIVE — AB

## 2015-02-11 LAB — LIPASE, BLOOD: LIPASE: 37 U/L (ref 22–51)

## 2015-02-11 MED ORDER — SODIUM CHLORIDE 0.9 % IV SOLN: INTRAVENOUS | Status: DC

## 2015-02-11 MED ORDER — ACETAMINOPHEN 325 MG PO TABS
650.0000 mg | ORAL_TABLET | Freq: Four times a day (QID) | ORAL | Status: DC | PRN
Start: 2015-02-11 — End: 2015-02-15
  Filled 2015-02-11: qty 2

## 2015-02-11 MED ORDER — ONDANSETRON HCL 4 MG/2ML IJ SOLN: 4.0000 mg | Freq: Four times a day (QID) | INTRAMUSCULAR | Status: DC | PRN

## 2015-02-11 MED ORDER — ONDANSETRON HCL 4 MG/2ML IJ SOLN
4.0000 mg | Freq: Once | INTRAMUSCULAR | Status: AC
Start: 2015-02-11 — End: 2015-02-11
  Administered 2015-02-11: 4 mg via INTRAVENOUS

## 2015-02-11 MED ORDER — ONDANSETRON HCL 4 MG/2ML IJ SOLN: 4.0000 mg | Freq: Once | INTRAMUSCULAR | Status: AC

## 2015-02-11 MED ORDER — ONDANSETRON 4 MG PO TBDP: 4.0000 mg | ORAL_TABLET | Freq: Three times a day (TID) | ORAL | Status: DC | PRN

## 2015-02-11 MED ORDER — PIPERACILLIN-TAZOBACTAM 3.375 G IVPB
3.3750 g | Freq: Once | INTRAVENOUS | Status: AC
  Administered 2015-02-11: 3.375 g via INTRAVENOUS
  Filled 2015-02-11: qty 50

## 2015-02-11 MED ORDER — ACETAMINOPHEN 500 MG PO TABS
ORAL_TABLET | ORAL | Status: AC
  Filled 2015-02-11: qty 2

## 2015-02-11 MED ORDER — SODIUM CHLORIDE 0.9 % IV BOLUS (SEPSIS)
1000.0000 mL | Freq: Once | INTRAVENOUS | Status: AC
  Administered 2015-02-11: 1000 mL via INTRAVENOUS

## 2015-02-11 MED ORDER — TOPIRAMATE 25 MG PO TABS
25.0000 mg | ORAL_TABLET | Freq: Two times a day (BID) | ORAL | Status: DC
  Administered 2015-02-12 – 2015-02-15 (×6): 25 mg via ORAL
  Filled 2015-02-11 (×10): qty 1

## 2015-02-11 MED ORDER — ACETAMINOPHEN 650 MG RE SUPP
650.0000 mg | Freq: Four times a day (QID) | RECTAL | Status: DC | PRN
Start: 2015-02-11 — End: 2015-02-15

## 2015-02-11 MED ORDER — PIPERACILLIN-TAZOBACTAM 3.375 G IVPB
3.3750 g | Freq: Once | INTRAVENOUS | Status: AC
  Administered 2015-02-12: 3.375 g via INTRAVENOUS
  Filled 2015-02-11: qty 50

## 2015-02-11 MED ORDER — MORPHINE SULFATE 2 MG/ML IJ SOLN
2.0000 mg | INTRAMUSCULAR | Status: DC | PRN
  Administered 2015-02-12 – 2015-02-15 (×10): 2 mg via INTRAVENOUS
  Filled 2015-02-11 (×10): qty 1

## 2015-02-11 MED ORDER — PANTOPRAZOLE SODIUM 40 MG PO TBEC
40.0000 mg | DELAYED_RELEASE_TABLET | Freq: Two times a day (BID) | ORAL | Status: DC
  Filled 2015-02-11: qty 1

## 2015-02-11 MED ORDER — SODIUM CHLORIDE 0.9 % IV SOLN
510.0000 mg | Freq: Once | INTRAVENOUS | Status: AC
  Administered 2015-02-12: 510 mg via INTRAVENOUS
  Filled 2015-02-11: qty 17

## 2015-02-11 MED ORDER — ACETAMINOPHEN 500 MG PO TABS
1000.0000 mg | ORAL_TABLET | Freq: Once | ORAL | Status: AC
  Administered 2015-02-11: 1000 mg via ORAL

## 2015-02-11 MED ORDER — ONDANSETRON HCL 4 MG/2ML IJ SOLN
INTRAMUSCULAR | Status: AC
  Filled 2015-02-11: qty 2

## 2015-02-11 MED ORDER — ALUM & MAG HYDROXIDE-SIMETH 200-200-20 MG/5ML PO SUSP: 30.0000 mL | Freq: Four times a day (QID) | ORAL | Status: DC | PRN

## 2015-02-11 MED ORDER — IBUPROFEN 400 MG PO TABS: 400.0000 mg | ORAL_TABLET | Freq: Once | ORAL | Status: DC

## 2015-02-11 MED ORDER — OXYCODONE-ACETAMINOPHEN 5-325 MG PO TABS: 1.0000 | ORAL_TABLET | ORAL | Status: DC | PRN

## 2015-02-11 MED ORDER — IBUPROFEN 400 MG PO TABS
600.0000 mg | ORAL_TABLET | Freq: Once | ORAL | Status: AC
  Administered 2015-02-11: 600 mg via ORAL
  Filled 2015-02-11: qty 2

## 2015-02-11 MED ORDER — IOHEXOL 300 MG/ML  SOLN
50.0000 mL | Freq: Once | INTRAMUSCULAR | Status: AC | PRN
  Administered 2015-02-11: 50 mL via ORAL

## 2015-02-11 MED ORDER — MORPHINE SULFATE 4 MG/ML IJ SOLN
4.0000 mg | Freq: Once | INTRAMUSCULAR | Status: AC
  Administered 2015-02-11: 4 mg via INTRAVENOUS
  Filled 2015-02-11: qty 1

## 2015-02-11 MED ORDER — PAZOPANIB HCL 200 MG PO TABS: 400.0000 mg | ORAL_TABLET | Freq: Every day | ORAL | Status: DC

## 2015-02-11 MED ORDER — ONDANSETRON HCL 4 MG PO TABS: 4.0000 mg | ORAL_TABLET | Freq: Four times a day (QID) | ORAL | Status: DC | PRN

## 2015-02-11 MED ORDER — ONDANSETRON HCL 4 MG/2ML IJ SOLN
4.0000 mg | Freq: Once | INTRAMUSCULAR | Status: AC
  Administered 2015-02-11: 4 mg via INTRAVENOUS
  Filled 2015-02-11: qty 2

## 2015-02-11 MED ORDER — MORPHINE SULFATE 4 MG/ML IJ SOLN
4.0000 mg | Freq: Once | INTRAMUSCULAR | Status: AC
Start: 2015-02-11 — End: 2015-02-11
  Administered 2015-02-11: 4 mg via INTRAVENOUS
  Filled 2015-02-11: qty 1

## 2015-02-11 NOTE — ED Notes (Signed)
Dr Stinson at bedside,  

## 2015-02-11 NOTE — Discharge Instructions (Signed)
Cholelithiasis  Cholelithiasis (also called gallstones) is a form of gallbladder disease in which gallstones form in your gallbladder. The gallbladder is an organ that stores bile made in the liver, which helps digest fats. Gallstones begin as small crystals and slowly grow into stones. Gallstone pain occurs when the gallbladder spasms and a gallstone is blocking the duct. Pain can also occur when a stone passes out of the duct.   RISK FACTORS   Being male.    Having multiple pregnancies. Health care providers sometimes advise removing diseased gallbladders before future pregnancies.    Being obese.   Eating a diet heavy in fried foods and fat.    Being older than 60 years and increasing age.    Prolonged use of medicines containing male hormones.    Having diabetes mellitus.    Rapidly losing weight.    Having a family history of gallstones (heredity).   SYMPTOMS   Nausea.    Vomiting.   Abdominal pain.    Yellowing of the skin (jaundice).    Sudden pain. It may persist from several minutes to several hours.   Fever.    Tenderness to the touch.  In some cases, when gallstones do not move into the bile duct, people have no pain or symptoms. These are called "silent" gallstones.   TREATMENT  Silent gallstones do not need treatment. In severe cases, emergency surgery may be required. Options for treatment include:   Surgery to remove the gallbladder. This is the most common treatment.   Medicines. These do not always work and may take 6-12 months or more to work.   Shock wave treatment (extracorporeal biliary lithotripsy). In this treatment an ultrasound machine sends shock waves to the gallbladder to break gallstones into smaller pieces that can pass into the intestines or be dissolved by medicine.  HOME CARE INSTRUCTIONS    Only take over-the-counter or prescription medicines for pain, discomfort, or fever as directed by your health care provider.    Follow a low-fat diet until  seen again by your health care provider. Fat causes the gallbladder to contract, which can result in pain.    Follow up with your health care provider as directed. Attacks are almost always recurrent and surgery is usually required for permanent treatment.   SEEK IMMEDIATE MEDICAL CARE IF:    Your pain increases and is not controlled by medicines.    You have a fever or persistent symptoms for more than 2-3 days.    You have a fever and your symptoms suddenly get worse.    You have persistent nausea and vomiting.   MAKE SURE YOU:    Understand these instructions.   Will watch your condition.   Will get help right away if you are not doing well or get worse.  Document Released: 08/30/2005 Document Revised: 05/06/2013 Document Reviewed: 02/25/2013  ExitCare Patient Information 2015 [employer]. This information is not intended to replace advice given to you by your health care provider. Make sure you discuss any questions you have with your health care provider.    Low-Fat Diet for Pancreatitis or Gallbladder Conditions  A low-fat diet can be helpful if you have pancreatitis or a gallbladder condition. With these conditions, your pancreas and gallbladder have trouble digesting fats. A healthy eating plan with less fat will help rest your pancreas and gallbladder and reduce your symptoms.  WHAT DO I NEED TO KNOW ABOUT THIS DIET?   Eat a low-fat diet.   Reduce your   fat intake to less than 20-30% of your total daily calories. This is less than 50-60 g of fat per day.   Remember that you need some fat in your diet. Ask your dietician what your daily goal should be.   Choose nonfat and low-fat healthy foods. Look for the words "nonfat," "low fat," or "fat free."   As a guide, look on the label and choose foods with less than 3 g of fat per serving. Eat only one serving.   Avoid alcohol.   Do not smoke. If you need help quitting, talk with your health care provider.   Eat small frequent meals  instead of three large heavy meals.  WHAT FOODS CAN I EAT?  Grains  Include healthy grains and starches such as potatoes, wheat bread, fiber-rich cereal, and brown rice. Choose whole grain options whenever possible. In adults, whole grains should account for 45-65% of your daily calories.   Fruits and Vegetables  Eat plenty of fruits and vegetables. Fresh fruits and vegetables add fiber to your diet.  Meats and Other Protein Sources  Eat lean meat such as chicken and pork. Trim any fat off of meat before cooking it. Eggs, fish, and beans are other sources of protein. In adults, these foods should account for 10-35% of your daily calories.  Dairy  Choose low-fat milk and dairy options. Dairy includes fat and protein, as well as calcium.   Fats and Oils  Limit high-fat foods such as fried foods, sweets, baked goods, sugary drinks.   Other  Creamy sauces and condiments, such as mayonnaise, can add extra fat. Think about whether or not you need to use them, or use smaller amounts or low fat options.  WHAT FOODS ARE NOT RECOMMENDED?   High fat foods, such as:   Baked goods.   Ice cream.   French toast.   Sweet rolls.   Pizza.   Cheese bread.   Foods covered with batter, butter, creamy sauces, or cheese.   Fried foods.   Sugary drinks and desserts.   Foods that cause gas or bloating  Document Released: 09/08/2013 Document Reviewed: 09/08/2013  Exit[employer] Patient Information 2015 [employer]. This information is not intended to replace advice given to you by your health care provider. Make sure you discuss any questions you have with your health care provider.

## 2015-02-11 NOTE — ED Notes (Signed)
Dr Ward at bedside,  

## 2015-02-11 NOTE — ED Provider Notes (Addendum)
TIME SEEN: 2:25 PM  CHIEF COMPLAINT: Abdominal pain, vomiting  HPI: Pt is a 64 y.o. 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 Pain radiates into his back. Denies chest pain or shortness of breath. Denies fever. No diarrhea. No bloody stool or melana. No dysuria or hematuria. States he does drink alcohol occasionally. No history of NSAID use.  Has had a right nephrectomy and left lower lobe removed for a cyst on his lung but no other surgery.   PCP is [facility]  ROS: See HPI Constitutional: no fever  Eyes: no drainage  ENT: no runny nose   Cardiovascular:  no chest pain  Resp: no SOB  GI:  vomiting GU: no dysuria Integumentary: no rash  Allergy: no hives  Musculoskeletal: no leg swelling  Neurological: no slurred speech ROS otherwise negative  PAST MEDICAL HISTORY/PAST SURGICAL HISTORY:  Past Medical History  Diagnosis Date  . Gout   . DJD (degenerative joint disease) of cervical spine   . Diverticulosis of colon     by colonoscopy 11/02/2009  . Barrett's esophagus     by EGD 11/02/2009  . Anemia   . Cancer     "was told has cancer on other kidney and spot on pancreas"  . 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 GERD (gastroesophageal reflux disease)     MEDICATIONS:  Prior to Admission medications   Medication Sig Start Date End Date Taking? Authorizing Provider  pantoprazole (PROTONIX) 40 MG tablet Take 1 tablet (40 mg total) by mouth 2 (two) times daily before a meal. 12/21/14  Yes [name], PA-C  pazopanib (VOTRIENT) 200 MG tablet TAKE 2 TABLETS (400MG  TOTAL) BY MOUTH DAILY ON AN EMPTY STOMACH 09/20/14  Yes [name], MD  topiramate (TOPAMAX) 25 MG tablet Take 25 mg by mouth 2 (two) times daily.   Yes  Historical Provider, MD    ALLERGIES:  Allergies  Allergen Reactions  . Nsaids Other (See Comments)    Patient only has one kidney.   . Demerol [Meperidine] Rash  . Meperidine Hcl Hives and Rash    SOCIAL HISTORY:  History  Substance Use Topics  . Smoking status: Former Smoker -- 0.25 packs/day for 4 years    Types: Cigars  . Smokeless tobacco: Never Used     Comment: occasional cigar  . Alcohol Use: 1.2 - 1.8 oz/week    2-3 Cans of beer per week     Comment: vodka and budweiser, sometimes daily, depends on notion. couple drinks. 2-3 beers on a weekend.    FAMILY HISTORY: Family History  Problem Relation Age of Onset  . Lung cancer Brother   . Prostate cancer Brother   . Colon cancer Neg Hx   . Lumbar disc disease Brother   . Prostate cancer Brother     EXAM: BP 137/52 mmHg  Pulse 52  Temp(Src) 97.8 F (36.6 C) (Oral)  Resp 22  Ht 5\' 11"  (1.803 m)  Wt 161 lb (73.029 kg)  BMI 22.46 kg/m2  SpO2 100% CONSTITUTIONAL: Alert and oriented and responds appropriately to questions. Well-appearing; well-nourished, nontoxic, vomiting  HEAD: Normocephalic EYES: Conjunctivae clear, PERRL ENT: normal nose; no rhinorrhea; moist mucous membranes; pharynx without lesions noted NECK: Supple, no meningismus, no LAD  CARD:  RRR; S1 and S2 appreciated; no murmurs, no clicks, no rubs, no gallops RESP: Normal chest excursion without splinting or tachypnea; breath sounds clear and equal bilaterally; no wheezes, no rhonchi, no rales, no hypoxia or respiratory distress, speaking full sentences ABD/GI: Normal bowel sounds; non-distended; soft, tender to palpation in epigastric region M. Umbilical area without guarding or rebound, no peritoneal signs, negative Murphy sign, no tenderness at McBurney's point RECTAL:  No gross blood or melanoma, normal rectal tone, minimally guaiac positive BACK:  The back appears normal and is non-tender to palpation, there is no CVA tenderness EXT: Normal ROM  in all joints; non-tender to palpation; no edema; normal capillary refill; no cyanosis, no calf tenderness or swelling    SKIN: Normal color for age and race; warm NEURO: Moves all extremities equally, sensation to light touch intact diffusely, cranial nerves II through XII intact PSYCH: The patient's mood and manner are appropriate. Grooming and personal hygiene are appropriate.  MEDICAL DECISION MAKING: Patient here with abdominal pain, vomiting. We'll obtain labs, urine, CT of his abdomen and pelvis. Given he is having epigastric pain we'll also obtain EKG and troponin. We'll give morphine, normal saline IV fluids and Zofran.  ED PROGRESS: Labs show anemia with hemoglobin of 7.8. This may be secondary to his oral chemotherapy but it appears that he has had a chronic decline in his hemoglobin. He is minimally guaiac positive today with no gross blood or melanoma. He is not on any anticoagulation. His AST and alkaline phosphatase are elevated as well as mild elevation of his total bilirubin. Lipase is normal. Troponin negative. EKG shows no new ischemic changes.  No leukocytosis.      CT scan shows gallstones but no sign of cholecystitis. He states he has had this similar pain multiple times before. Suspect this is biliary colic. Have advised him on how to change his diet. He states his pain is controlled and he has not vomited since receiving Zofran. He states he is ready for discharge home. We'll give him outpatient general surgery follow-up information. We'll discharge with prescription for Percocet and Zofran. Discussed return precautions. He verbalizes understanding and is comfortable with plan.   EKG Interpretation  Date/Time:  Friday Feb 11 2015 14:41:26 EDT Ventricular Rate:  57 PR Interval:    QRS Duration: 87 QT Interval:  450 QTC Calculation: 438 R Axis:   24 Text Interpretation:  Normal sinus rhythm Anteroseptal infarct No significant change since last tracing Confirmed by Kenna Gilbert, Shanon Seawright [phone]) on 02/11/2015 2:52:35 PM        [place], DO 02/11/15 1903    Patient was discharged but called back to the room by nursing staff as he is having rigors, fever of 102.8. He reports feeling worse. Suspect he may have clinical cholecystitis and is not able to surmount a leukocytosis given his tyrosine kinase inhibitor. Discussed with [name] a general surgery who recommends hospitalist admission, and about except he will see the patient in the morning.  Discussed with patient who  feels more comfortable now staying in the hospital.  [place], DO 02/11/15 1914

## 2015-02-11 NOTE — Progress Notes (Signed)
ANTIBIOTIC CONSULT NOTE-Preliminary  Pharmacy Consult for zosyn Indication: intra abdominal infection  Allergies  Allergen Reactions  . Nsaids Other (See Comments)    Patient only has one kidney.   Marland Kitchen Amoxicillin     n/v  . Demerol [Meperidine] Rash  . Meperidine Hcl Hives and Rash    Patient Measurements: Height: 5\' 11"  (180.3 cm) Weight: 166 lb 10.7 oz (75.6 kg) IBW/kg (Calculated) : 75.3   Vital Signs: Temp: 98.3 F (36.8 C) (05/27 2228) Temp Source: Oral (05/27 2228) BP: 107/62 mmHg (05/27 2228) Pulse Rate: 87 (05/27 2228)  Labs:  Recent Labs  02/11/15 1435  WBC 4.3  HGB 7.8*  PLT 200  CREATININE 1.42*    Estimated Creatinine Clearance: 56.7 mL/min (by C-G formula based on Cr of 1.42).  No results for input(s): VANCOTROUGH, VANCOPEAK, VANCORANDOM, GENTTROUGH, GENTPEAK, GENTRANDOM, TOBRATROUGH, TOBRAPEAK, TOBRARND, AMIKACINPEAK, AMIKACINTROU, AMIKACIN in the last 72 hours.   Microbiology: No results found for this or any previous visit (from the past 720 hour(s)).  Medical History: Past Medical History  Diagnosis Date  . Gout   . DJD (degenerative joint disease) of cervical spine   . Diverticulosis of colon     by colonoscopy 11/02/2009  . Barrett's esophagus     by EGD 11/02/2009  . Anemia   . Cancer     "was told has cancer on other kidney and spot on pancreas"  . 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 GERD (gastroesophageal reflux disease)     Medications:  Scheduled:  . ferumoxytol  510 mg Intravenous Once  . [START ON 02/12/2015] pantoprazole  40 mg Oral BID AC  . [START ON 02/12/2015] pazopanib  400 mg Oral QAC breakfast  . [START ON 02/12/2015] piperacillin-tazobactam (ZOSYN)  IV  3.375 g Intravenous Once  . topiramate  25 mg Oral BID    Assessment: 64 yo male with severe abdominal pain in the epigastric area. Pt admitted for cholecystitis and surgery consult.  Treatment with Zosyn started in ED.   Plan:  Preliminary review of  pertinent patient information completed.  Protocol will be initiated with a one-time dose(s) of Zosyn 3.375 grams due at 0330, 8 hours after first dose in ED.  Forestine Na clinical pharmacist will complete review during morning rounds to assess patient and finalize treatment regimen.  Nyra Capes, RPH 02/11/2015,11:47 PM

## 2015-02-11 NOTE — ED Notes (Signed)
Pt updated on plan of care, drinking contrast for ct scan, reports that his pain is better,

## 2015-02-11 NOTE — ED Notes (Addendum)
Pt co sudden onset of generalized abdominal pain that radiates to the back, N/V. Pt has kidney cancer and is treted by PO chemo. HR 52 at triage.

## 2015-02-11 NOTE — ED Notes (Signed)
Dr Nehemiah Settle notified of motrin listed as allergy due to one kidney contraindication, advised to continue with one dose motrin

## 2015-02-11 NOTE — ED Notes (Signed)
Dr Leonides Schanz notified of pt's reaction to amoxicillin as n/v, advised that as long as pt denies any sob, hives, swelling to continue with zosyn, pt reports that his only reaction is n/v

## 2015-02-11 NOTE — H&P (Addendum)
History and Physical  [name] EXH:[id number] DOB: 02-02-1951 DOA: 02/11/2015  Referring physician: Dr. Leonides Schanz, ED physician PCP: [name], MD   Chief Complaint: Abdominal pain  HPI: Jeffrey Trujillo is a 64 y.o. male  With a history of renal cell carcinoma, status post left nephrectomy and current treatment with Votrient, metastatic disease to the pancreas, GERD. Patient has been having intermittent abdominal pain in the epigastric area over the past several months. The pain has been extremely irregular and lasting only for 15-20 minutes at a time. The pain is usually aching and dull for cramping and never 2 days in a row. He would often go several days without having symptoms. Today, he started having the pain which was extremely severe and unremitting. The patient had profuse sweating, nausea, vomiting. There were no palliating factors to his pain and he was intolerant to food. Patient presented to the emergency department for evaluation.  Review of Systems:   Pt denies any chest pain, shortness of breath, palpitations, cough, wheeze, headache, blurred vision, melanoma, rectal bleeding, constipation.  Review of systems are otherwise negative  Past Medical History  Diagnosis Date  . Gout   . DJD (degenerative joint disease) of cervical spine   . Diverticulosis of colon     by colonoscopy 11/02/2009  . Barrett's esophagus     by EGD 11/02/2009  . Anemia   . Cancer     "was told has cancer on other kidney and spot on pancreas"  . 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 GERD (gastroesophageal reflux disease)    Past Surgical History  Procedure Laterality Date  . Nephrectomy      right due to cancer  . Lung removal, partial      left lower lobe, benign  . Eus  08/07/2012    Procedure: UPPER ENDOSCOPIC ULTRASOUND (EUS) LINEAR;  Surgeon: [name], MD;  Location: [facility];  Service: Endoscopy;  Laterality: N/A;  . Inguinal hernia repair    . Hernia  repair    . Inguinal hernia repair      Right  . Eus  08/21/2012    Procedure: UPPER ENDOSCOPIC ULTRASOUND (EUS) LINEAR;  Surgeon: [name], MD;  Location: [facility];  Service: Endoscopy;  Laterality: N/A;  . Colonoscopy  Feb 2011    [name]: single anal papilla, pan-colonic diverticula  . Esophagogastroduodenoscopy  Feb 2011    [name]: salmon-colored epithelium consistent with Barrett's, nodular antral erosions, small hiatal hernia, negative H.pylori  . Colonoscopy  09/25/2012    RMR: Colonic diverticulosis. Suspect diverticular bleeding-hepatic flexure tic-sealed  . Esophagogastroduodenoscopy  09/25/2012    FYB:[id number]'H esophagus. Small hiatal hernia. Antral erosions of doubtful clinical significance, no biopsies  . Esophagogastroduodenoscopy N/A 03/11/2013    SLF: Barrett's esophagus/Single ulcer in the gastric antrum-PROBABLE SOURCE FOR BLACK STOOL/ Chronic gastritis in the gastric antrum  . Esophagogastroduodenoscopy N/A 10/11/2014    Dr. [name] barrett's s/p biopsy. Hiatal hernia. Biopsy with intestinal metaplasia, glandular atypia indefinite for low grade dysplasia noted as well  . Esophagogastroduodenoscopy N/A 01/19/2015    Procedure: ESOPHAGOGASTRODUODENOSCOPY (EGD);  Surgeon: [name], MD;  Location: [facility];  Service: Endoscopy;  Laterality: N/A;  200   Social History:  reports that he has quit smoking. His smoking use included Cigars. He has never used smokeless tobacco. He reports that he drinks about 1.2 - 1.8 oz of alcohol per week. He reports that he does not  use illicit drugs. Patient lives at home & is able to participate in activities of daily living  Allergies  Allergen Reactions  . Nsaids Other (See Comments)    Patient only has one kidney.   Marland Kitchen Amoxicillin     n/v  . Demerol [Meperidine] Rash  . Meperidine Hcl Hives and Rash    Family History  Problem Relation Age of Onset  . Lung cancer Brother   . Prostate cancer Brother   .  Colon cancer Neg Hx   . Lumbar disc disease Brother   . Prostate cancer Brother      Prior to Admission medications   Medication Sig Start Date End Date Taking? Authorizing Provider  pantoprazole (PROTONIX) 40 MG tablet Take 1 tablet (40 mg total) by mouth 2 (two) times daily before a meal. 12/21/14  Yes [name], PA-C  pazopanib (VOTRIENT) 200 MG tablet TAKE 2 TABLETS (400MG  TOTAL) BY MOUTH DAILY ON AN EMPTY STOMACH 09/20/14  Yes [name], MD  topiramate (TOPAMAX) 25 MG tablet Take 25 mg by mouth 2 (two) times daily.   Yes Historical Provider, MD  ondansetron (ZOFRAN ODT) 4 MG disintegrating tablet Take 1 tablet (4 mg total) by mouth every 8 (eight) hours as needed for nausea or vomiting. 02/11/15   [name], DO  oxyCODONE-acetaminophen (PERCOCET/ROXICET) 5-325 MG per tablet Take 1 tablet by mouth every 4 (four) hours as needed. 02/11/15   [name] Ward, DO    Physical Exam: BP 179/121 mmHg  Pulse 55  Temp(Src) 102.8 F (39.3 C) (Oral)  Resp 27  Ht 5\' 11"  (1.803 m)  Wt 73.029 kg (161 lb)  BMI 22.46 kg/m2  SpO2 98%  General: Older Caucasian male. Awake and alert and oriented x3. No acute cardiopulmonary distress.  Eyes: Pupils equal, round, reactive to light. Extraocular muscles are intact. Sclerae anicteric and noninjected.  ENT: Moist mucosal membranes. No mucosal lesions.  Neck: Neck supple without lymphadenopathy. No carotid bruits. No masses palpated.  Cardiovascular: Regular rate with normal S1-S2 sounds. No murmurs, rubs, gallops auscultated. No JVD.  Respiratory: Good respiratory effort with no wheezes, rales, rhonchi. Lungs clear to auscultation bilaterally.  Abdomen: Soft, tender in the epigastric area to mild palpation, nondistended. No rebound tenderness, but has mild guarding area and Active bowel sounds. No masses or hepatosplenomegaly  Skin: Dry, warm to touch. 2+ dorsalis pedis and radial pulses. Musculoskeletal: No calf or leg pain. All major joints not  erythematous nontender.  Psychiatric: Intact judgment and insight.  Neurologic: No focal neurological deficits. Cranial nerves II through XII are grossly intact.           Labs on Admission:  Basic Metabolic Panel:  Recent Labs Lab 02/11/15 1435  NA 138  K 3.2*  CL 110  CO2 20*  GLUCOSE 106*  BUN 19  CREATININE 1.42*  CALCIUM 8.4*   Liver Function Tests:  Recent Labs Lab 02/11/15 1435  AST 168*  ALT 55  ALKPHOS 344*  BILITOT 1.3*  PROT 6.7  ALBUMIN 3.0*    Recent Labs Lab 02/11/15 1435  LIPASE 37   No results for input(s): AMMONIA in the last 168 hours. CBC:  Recent Labs Lab 02/11/15 1435  WBC 4.3  NEUTROABS 3.7  HGB 7.8*  HCT 24.0*  MCV 97.6  PLT 200   Cardiac Enzymes:  Recent Labs Lab 02/11/15 1435  TROPONINI <0.03    BNP (last 3 results)  Recent Labs  01/03/15 1043  BNP 57.0  ProBNP (last 3 results) No results for input(s): PROBNP in the last 8760 hours.  CBG: No results for input(s): GLUCAP in the last 168 hours.  Radiological Exams on Admission: Ct Abdomen Pelvis Wo Contrast  02/11/2015   CLINICAL DATA:  Sudden onset of generalized abdominal pain  EXAM: CT ABDOMEN AND PELVIS WITHOUT CONTRAST  TECHNIQUE: Multidetector CT imaging of the abdomen and pelvis was performed following the standard protocol without IV contrast.  COMPARISON:  01/18/2015  FINDINGS: Lower chest: There is no pleural effusion identified. The lung bases are clear.  Hepatobiliary: There is hepatic steatosis noted. There is a small low density structure along the dome of liver measuring 6 mm. This is too small to characterize. Small stone within the dependent portion of the gallbladder.  Pancreas: Pancreas is negative  Spleen: Negative  Adrenals/Urinary Tract: Normal appearence of the adrenal glands. Status post right nephrectomy. Cyst noted arising from upper pole of left kidney. Mass arising from upper pole of left kidney measures 4.2 cm, image 28/series 2. This is  not significantly changed in size from recent exam. There is mild left pelvocaliectasis and hydroureter. No obstructing stone identified. The urinary bladder is normal.  Stomach/Bowel: Small hiatal hernia noted. The stomach is within normal limits. The small bowel loops have a normal course and caliber. No obstruction. Multiple distal colonic diverticula noted without acute inflammation.  Vascular/Lymphatic: Normal appearance of the aorta. No enlarged retroperitoneal or mesenteric adenopathy. No enlarged pelvic or inguinal lymph nodes.  Reproductive: Prostate gland and seminal vesicles are unremarkable.  Other: There is no ascites or focal fluid collections within the abdomen or pelvis.  Musculoskeletal: Degenerative disc disease noted at the L5-S1 level.  IMPRESSION: 1. Mild left pelvocaliectasis and hydroureter without evidence for obstructing stone. 2. Again noted is of mass arising from the upper pole the left kidney. This is compatible with a renal cell carcinoma. Not significantly changed from 01/18/2015. 3. Gallstone. 4. Previous right nephrectomy.   Electronically Signed   By: Kerby Moors M.D.   On: 02/11/2015 18:07    EKG: Independently reviewed. Normal sinus rhythm with normal intervals. Q waves in V1 and V2 suggestive of old anteroseptal infarct. No acute ST changes  Assessment/Plan Present on Admission:  . Cholecystitis . Anemia due to blood loss  This patient was discussed with the ED physician, including pertinent vitals, physical exam findings, labs, and imaging.  We also discussed care given by the ED provider.  #1 cholecystitis #2 anemia due to blood loss #3 renal cell carcinoma status post nephrectomy and currently on Votrient #4 GERD  Admit Consult surgery IV fluids Zosyn Ferriheme for anemia - currently asymptomatic Recheck CBC and BMP in the morning  DVT prophylaxis: SCDs  Consultants: Gen. surgery  Code Status: Full code  Family Communication: Wife in the room     Disposition Plan: Home following treatment  Truett Mainland, DO [facility] Pager [phone]

## 2015-02-12 ENCOUNTER — Inpatient Hospital Stay (HOSPITAL_COMMUNITY): Payer: 59

## 2015-02-12 ENCOUNTER — Encounter (HOSPITAL_COMMUNITY): Payer: Self-pay | Admitting: *Deleted

## 2015-02-12 DIAGNOSIS — R7989 Other specified abnormal findings of blood chemistry: Secondary | ICD-10-CM

## 2015-02-12 DIAGNOSIS — R001 Bradycardia, unspecified: Secondary | ICD-10-CM

## 2015-02-12 DIAGNOSIS — K8020 Calculus of gallbladder without cholecystitis without obstruction: Secondary | ICD-10-CM

## 2015-02-12 DIAGNOSIS — D500 Iron deficiency anemia secondary to blood loss (chronic): Secondary | ICD-10-CM

## 2015-02-12 DIAGNOSIS — R1011 Right upper quadrant pain: Secondary | ICD-10-CM

## 2015-02-12 DIAGNOSIS — R945 Abnormal results of liver function studies: Secondary | ICD-10-CM

## 2015-02-12 DIAGNOSIS — C642 Malignant neoplasm of left kidney, except renal pelvis: Secondary | ICD-10-CM

## 2015-02-12 DIAGNOSIS — N179 Acute kidney failure, unspecified: Secondary | ICD-10-CM

## 2015-02-12 DIAGNOSIS — E440 Moderate protein-calorie malnutrition: Secondary | ICD-10-CM | POA: Diagnosis present

## 2015-02-12 LAB — CBC
HCT: 23.8 % — ABNORMAL LOW (ref 39.0–52.0)
HEMATOCRIT: 27.3 % — AB (ref 39.0–52.0)
Hemoglobin: 7.6 g/dL — ABNORMAL LOW (ref 13.0–17.0)
Hemoglobin: 9 g/dL — ABNORMAL LOW (ref 13.0–17.0)
MCH: 31.5 pg (ref 26.0–34.0)
MCH: 30.8 pg (ref 26.0–34.0)
MCHC: 31.9 g/dL (ref 30.0–36.0)
MCHC: 33 g/dL (ref 30.0–36.0)
MCV: 98.8 fL (ref 78.0–100.0)
MCV: 93.5 fL (ref 78.0–100.0)
Platelets: 168 10*3/uL (ref 150–400)
Platelets: 158 10*3/uL (ref 150–400)
RBC: 2.41 MIL/uL — ABNORMAL LOW (ref 4.22–5.81)
RBC: 2.92 MIL/uL — AB (ref 4.22–5.81)
RDW: 17.9 % — AB (ref 11.5–15.5)
RDW: 18.1 % — ABNORMAL HIGH (ref 11.5–15.5)
WBC: 3.5 10*3/uL — ABNORMAL LOW (ref 4.0–10.5)
WBC: 4.7 10*3/uL (ref 4.0–10.5)

## 2015-02-12 LAB — HEPATIC FUNCTION PANEL
ALK PHOS: 350 U/L — AB (ref 38–126)
ALT: 156 U/L — ABNORMAL HIGH (ref 17–63)
AST: 343 U/L — AB (ref 15–41)
Albumin: 2.6 g/dL — ABNORMAL LOW (ref 3.5–5.0)
BILIRUBIN DIRECT: 2 mg/dL — AB (ref 0.1–0.5)
BILIRUBIN INDIRECT: 1 mg/dL — AB (ref 0.3–0.9)
TOTAL PROTEIN: 6 g/dL — AB (ref 6.5–8.1)
Total Bilirubin: 3 mg/dL — ABNORMAL HIGH (ref 0.3–1.2)

## 2015-02-12 LAB — PREPARE RBC (CROSSMATCH)

## 2015-02-12 LAB — BASIC METABOLIC PANEL
ANION GAP: 10 (ref 5–15)
BUN: 17 mg/dL (ref 6–20)
CO2: 23 mmol/L (ref 22–32)
CREATININE: 1.63 mg/dL — AB (ref 0.61–1.24)
Calcium: 8.3 mg/dL — ABNORMAL LOW (ref 8.9–10.3)
Chloride: 106 mmol/L (ref 101–111)
GFR calc Af Amer: 50 mL/min — ABNORMAL LOW (ref >60)
GFR calc non Af Amer: 43 mL/min — ABNORMAL LOW (ref >60)
Glucose, Bld: 83 mg/dL (ref 65–99)
Potassium: 3.4 mmol/L — ABNORMAL LOW (ref 3.5–5.1)
SODIUM: 139 mmol/L (ref 135–145)

## 2015-02-12 MED ORDER — PANTOPRAZOLE SODIUM 40 MG IV SOLR
40.0000 mg | Freq: Two times a day (BID) | INTRAVENOUS | Status: DC
  Administered 2015-02-12 – 2015-02-13 (×4): 40 mg via INTRAVENOUS
  Filled 2015-02-12 (×4): qty 40

## 2015-02-12 MED ORDER — SODIUM CHLORIDE 0.9 % IV SOLN: Freq: Once | INTRAVENOUS | Status: DC

## 2015-02-12 MED ORDER — SODIUM CHLORIDE 0.9 % IJ SOLN
INTRAMUSCULAR | Status: AC
  Filled 2015-02-12: qty 36

## 2015-02-12 MED ORDER — PIPERACILLIN-TAZOBACTAM 3.375 G IVPB
3.3750 g | Freq: Three times a day (TID) | INTRAVENOUS | Status: DC
  Administered 2015-02-12 – 2015-02-13 (×4): 3.375 g via INTRAVENOUS
  Filled 2015-02-12 (×7): qty 50

## 2015-02-12 MED ORDER — POTASSIUM CHLORIDE CRYS ER 20 MEQ PO TBCR
20.0000 meq | EXTENDED_RELEASE_TABLET | Freq: Two times a day (BID) | ORAL | Status: AC
  Administered 2015-02-12 (×2): 20 meq via ORAL
  Filled 2015-02-12 (×2): qty 1

## 2015-02-12 MED ORDER — POTASSIUM CHLORIDE IN NACL 20-0.9 MEQ/L-% IV SOLN
INTRAVENOUS | Status: DC
  Administered 2015-02-12: 17:00:00 via INTRAVENOUS
  Administered 2015-02-13: 1 mL via INTRAVENOUS
  Administered 2015-02-14: 20:00:00 via INTRAVENOUS

## 2015-02-12 MED ORDER — GADOBENATE DIMEGLUMINE 529 MG/ML IV SOLN
7.0000 mL | Freq: Once | INTRAVENOUS | Status: AC | PRN
  Administered 2015-02-12: 7 mL via INTRAVENOUS

## 2015-02-12 MED ORDER — ENSURE ENLIVE PO LIQD
237.0000 mL | Freq: Two times a day (BID) | ORAL | Status: DC
  Administered 2015-02-12 – 2015-02-15 (×6): 237 mL via ORAL

## 2015-02-12 MED ORDER — PIPERACILLIN-TAZOBACTAM 3.375 G IVPB
INTRAVENOUS | Status: AC
  Filled 2015-02-12: qty 50

## 2015-02-12 NOTE — Progress Notes (Signed)
[name]  BERTRAND VOWELS KNL:[id number] DOB: 1951-03-19 DOA: 02/11/2015 PCP: [name], MD    Code Status: Full code Family Communication: Discussed with wife Disposition Plan: Discharge when clinically appropriate   Consultants:  Gen. surgery, [name]  Procedures:  None  Antibiotics:  Zosyn 5/27>>  HPI/Subjective: Patient has less right upper quadrant abdominal pain. He denies nausea, vomiting, or diarrhea. He reports black tarry stools but no bright red blood per rectum recently. He is hungry.  Objective: Filed Vitals:   02/12/15 1315  BP: 148/66  Pulse: 58  Temp: 99.5 F (37.5 C)  Resp: 20   oxygen saturation 100%.  Intake/Output Summary (Last 24 hours) at 02/12/15 1324 Last data filed at 02/12/15 1245  Gross per 24 hour  Intake   1335 ml  Output      2 ml  Net   1333 ml   Filed Weights   02/11/15 1352 02/11/15 2228  Weight: 73.029 kg (161 lb) 75.6 kg (166 lb 10.7 oz)    Exam:   General:  Debilitated appearing 64 year old man in no acute distress. Mildly jaundiced.  Cardiovascular: S1, S2, with 1 to 2/6 systolic murmur.  Respiratory: Decreased breath sounds at bases, otherwise clear.  Abdomen: Positive bowel sounds, soft, mildly tender in the epigastrium; no masses palpated; no appreciable distention.  Musculoskeletal/extremities: No acute hot red joints. No pedal edema.  Neurologic: He is alert and oriented 3. His speech is clear.  Data Reviewed: Basic Metabolic Panel:  Recent Labs Lab 02/11/15 1435 02/12/15 0549  NA 138 139  K 3.2* 3.4*  CL 110 106  CO2 20* 23  GLUCOSE 106* 83  BUN 19 17  CREATININE 1.42* 1.63*  CALCIUM 8.4* 8.3*   Liver Function Tests:  Recent Labs Lab 02/11/15 1435 02/12/15 0549  AST 168* 343*  ALT 55 156*  ALKPHOS 344* 350*  BILITOT 1.3* 3.0*  PROT 6.7 6.0*  ALBUMIN 3.0* 2.6*    Recent Labs Lab 02/11/15 1435  LIPASE 37   No results for input(s): AMMONIA in the  last 168 hours. CBC:  Recent Labs Lab 02/11/15 1435 02/12/15 0549  WBC 4.3 3.5*  NEUTROABS 3.7  --   HGB 7.8* 7.6*  HCT 24.0* 23.8*  MCV 97.6 98.8  PLT 200 168   Cardiac Enzymes:  Recent Labs Lab 02/11/15 1435  TROPONINI <0.03   BNP (last 3 results)  Recent Labs  01/03/15 1043  BNP 57.0    ProBNP (last 3 results) No results for input(s): PROBNP in the last 8760 hours.  CBG: No results for input(s): GLUCAP in the last 168 hours.  No results found for this or any previous visit (from the past 240 hour(s)).   Studies: Ct Abdomen Pelvis Wo Contrast  02/11/2015   CLINICAL DATA:  Sudden onset of generalized abdominal pain  EXAM: CT ABDOMEN AND PELVIS WITHOUT CONTRAST  TECHNIQUE: Multidetector CT imaging of the abdomen and pelvis was performed following the standard protocol without IV contrast.  COMPARISON:  01/18/2015  FINDINGS: Lower chest: There is no pleural effusion identified. The lung bases are clear.  Hepatobiliary: There is hepatic steatosis noted. There is a small low density structure along the dome of liver measuring 6 mm. This is too small to characterize. Small stone within the dependent portion of the gallbladder.  Pancreas: Pancreas is negative  Spleen: Negative  Adrenals/Urinary Tract: Normal appearence of the adrenal glands. Status post right nephrectomy. Cyst noted arising from upper pole of left kidney. Mass arising  from upper pole of left kidney measures 4.2 cm, image 28/series 2. This is not significantly changed in size from recent exam. There is mild left pelvocaliectasis and hydroureter. No obstructing stone identified. The urinary bladder is normal.  Stomach/Bowel: Small hiatal hernia noted. The stomach is within normal limits. The small bowel loops have a normal course and caliber. No obstruction. Multiple distal colonic diverticula noted without acute inflammation.  Vascular/Lymphatic: Normal appearance of the aorta. No enlarged retroperitoneal or  mesenteric adenopathy. No enlarged pelvic or inguinal lymph nodes.  Reproductive: Prostate gland and seminal vesicles are unremarkable.  Other: There is no ascites or focal fluid collections within the abdomen or pelvis.  Musculoskeletal: Degenerative disc disease noted at the L5-S1 level.  IMPRESSION: 1. Mild left pelvocaliectasis and hydroureter without evidence for obstructing stone. 2. Again noted is of mass arising from the upper pole the left kidney. This is compatible with a renal cell carcinoma. Not significantly changed from 01/18/2015. 3. Gallstone. 4. Previous right nephrectomy.   Electronically Signed   By: Kerby Moors M.D.   On: 02/11/2015 18:07   Mr 3d Recon At Scanner  02/12/2015   CLINICAL DATA:  Upper abdominal pain.  EXAM: MRI ABDOMEN WITHOUT AND WITH CONTRAST (INCLUDING MRCP)  TECHNIQUE: Multiplanar multisequence MR imaging of the abdomen was performed both before and after the administration of intravenous contrast. Heavily T2-weighted images of the biliary and pancreatic ducts were obtained, and three-dimensional MRCP images were rendered by post processing.  CONTRAST:  47mL MULTIHANCE GADOBENATE DIMEGLUMINE 529 MG/ML IV SOLN  COMPARISON:  02/11/2015  FINDINGS: Lower chest:  No pleural or pericardial effusion noted.  Hepatobiliary: Diffuse low T2 signal throughout the liver is identified which likely reflects susceptibility due to iron deposition within the liver hand or therapy with ferriheme. No focal liver abnormality noted. The gallbladder appears normal. There is no biliary dilatation. Normal caliber of the common bile duct. There is a small cyst noted within the left lobe measuring 6 mm.  Pancreas: Within the head of pancreas, in the pancreaticoduodenal groove, the previously noted metastasis measures 0.9 cm, image 51 of series 5004. This is decreased in size from MRI dated 07/18/2012 when it measured 2.6 cm. No new areas of pancreatic metastasis identified. No focal pancreas  abnormality identified. Small soft tissue attenuating nodule ventral to the tail of pancreas likely represents a splenule measuring 1.2 cm.  Spleen: The spleen measures 14.5 cm in length, image 13 of series 4. There is a diffuse abnormal low T2 signal throughout the spleen.  Adrenals/Urinary Tract: Normal appearance of the adrenal glands. Previous right nephrectomy. Cyst is noted within the upper pole of the left kidney measuring 4 cm, image 22/series 5. Also within the upper pole the left kidney is an enhancing solid mass measuring 5.3 cm and compatible with a renal cell carcinoma. Due therapy with Ferriheme, enhancement characteristics of the left kidney renal cell carcinoma and left renal vein are difficult to accurately assessed.  Stomach/Bowel: The stomach appears within normal limits. The small bowel loops appear mildly increased in caliber measuring up to 2.8 cm. The visualized portions of the colon are unremarkable.  Vascular/Lymphatic: On the precontrast images there is increased signal throughout the blood pool. This presumably reflects treatment with Ferriheme. No aneurysm identified. There is no upper abdominal adenopathy.  Other: No free fluid or fluid collections identified within the upper abdomen.  Musculoskeletal: There is abnormal decreased T2 and T1 signal throughout the bone marrow. This may be related to patient's  anemia and or treatment with Ferriheme.  IMPRESSION: 1. Exam detail is diminished due to altered signal secondary to Ferriheme therapy. 2. No acute findings noted and no explanation for upper abdominal pain. Specifically no evidence for gallstones or biliary a tract obstruction. 3. Solid mass within the upper pole of left kidney consistent with a renal cell carcinoma. 4. Splenomegaly. 5. 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   Electronically Signed   By: Kerby Moors M.D.   On: 02/12/2015 12:49   Mr Abd W/wo Cm/mrcp  02/12/2015   CLINICAL  DATA:  Upper abdominal pain.  EXAM: MRI ABDOMEN WITHOUT AND WITH CONTRAST (INCLUDING MRCP)  TECHNIQUE: Multiplanar multisequence MR imaging of the abdomen was performed both before and after the administration of intravenous contrast. Heavily T2-weighted images of the biliary and pancreatic ducts were obtained, and three-dimensional MRCP images were rendered by post processing.  CONTRAST:  68mL MULTIHANCE GADOBENATE DIMEGLUMINE 529 MG/ML IV SOLN  COMPARISON:  02/11/2015  FINDINGS: Lower chest:  No pleural or pericardial effusion noted.  Hepatobiliary: Diffuse low T2 signal throughout the liver is identified which likely reflects susceptibility due to iron deposition within the liver hand or therapy with ferriheme. No focal liver abnormality noted. The gallbladder appears normal. There is no biliary dilatation. Normal caliber of the common bile duct. There is a small cyst noted within the left lobe measuring 6 mm.  Pancreas: Within the head of pancreas, in the pancreaticoduodenal groove, the previously noted metastasis measures 0.9 cm, image 51 of series 5004. This is decreased in size from MRI dated 07/18/2012 when it measured 2.6 cm. No new areas of pancreatic metastasis identified. No focal pancreas abnormality identified. Small soft tissue attenuating nodule ventral to the tail of pancreas likely represents a splenule measuring 1.2 cm.  Spleen: The spleen measures 14.5 cm in length, image 13 of series 4. There is a diffuse abnormal low T2 signal throughout the spleen.  Adrenals/Urinary Tract: Normal appearance of the adrenal glands. Previous right nephrectomy. Cyst is noted within the upper pole of the left kidney measuring 4 cm, image 22/series 5. Also within the upper pole the left kidney is an enhancing solid mass measuring 5.3 cm and compatible with a renal cell carcinoma. Due therapy with Ferriheme, enhancement characteristics of the left kidney renal cell carcinoma and left renal vein are difficult to  accurately assessed.  Stomach/Bowel: The stomach appears within normal limits. The small bowel loops appear mildly increased in caliber measuring up to 2.8 cm. The visualized portions of the colon are unremarkable.  Vascular/Lymphatic: On the precontrast images there is increased signal throughout the blood pool. This presumably reflects treatment with Ferriheme. No aneurysm identified. There is no upper abdominal adenopathy.  Other: No free fluid or fluid collections identified within the upper abdomen.  Musculoskeletal: There is abnormal decreased T2 and T1 signal throughout the bone marrow. This may be related to patient's anemia and or treatment with Ferriheme.  IMPRESSION: 1. Exam detail is diminished due to altered signal secondary to Ferriheme therapy. 2. No acute findings noted and no explanation for upper abdominal pain. Specifically no evidence for gallstones or biliary a tract obstruction. 3. Solid mass within the upper pole of left kidney consistent with a renal cell carcinoma. 4. Splenomegaly. 5. 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   Electronically Signed   By: Kerby Moors M.D.   On: 02/12/2015 12:49   US Abdomen Limited Ruq  02/12/2015   CLINICAL DATA:  Elevated liver function tests  EXAM: US ABDOMEN LIMITED - RIGHT UPPER QUADRANT  COMPARISON:  CT 02/11/2015  FINDINGS: Gallbladder:  No gallstones or wall thickening visualized. No sonographic Murphy sign noted.  Common bile duct:  Diameter: 5 mm  Liver:  No focal lesion identified. Upper limits of normal in parenchymal echogenicity.  IMPRESSION: Mildly increased hepatic echogenicity which is nonspecific and could indicate steatosis or other infiltrative process.   Electronically Signed   By: Conchita Paris M.D.   On: 02/12/2015 11:06    Scheduled Meds: . sodium chloride   Intravenous Once  . feeding supplement (ENSURE ENLIVE)  237 mL Oral BID BM  . pantoprazole (PROTONIX) IV  40 mg Intravenous Q12H   . piperacillin-tazobactam (ZOSYN)  IV  3.375 g Intravenous Q8H  . sodium chloride      . topiramate  25 mg Oral BID   Continuous Infusions: . sodium chloride     Assessment and plan:  Principal Problem:   Abdominal pain, right upper quadrant Active Problems:   Cholecystitis   Elevated LFTs   Anemia due to blood loss   Renal cell cancer   Barrett's esophagus   Malnutrition of moderate degree   1. Right upper quadrant abdominal pain. On admission, the patient's CT scan revealed a gallstone, but no suspicious ascites or fluid collections. He was febrile with a temperature of temperature of 102.8. He was started on Zosyn empirically. Blood cultures were not ordered. There was a suspicion of acute or cholecystitis. General surgeon, [name] was consulted. He noted that the patient's liver enzymes and total bilirubin increased significantly. Therefore, he ordered an ultrasound of the abdomen followed by an MRCP to assess the hepatobiliary tree. The ultrasound revealed mild increased hepatic echogenicity-query steatosis. The MRCP revealed no suspicious findings that would explain the patient's right upper quadrant abdominal pain, but the exam was limited due to altered signal secondary to Ferriheme given following admission. We'll continue PPI empirically and morphine as needed for pain.  Elevated LFTs. The patient's AST was 168, ALT 55, and total bilirubin was 1.3 on admission. His transaminases have increased significantly. He takes Votrient for renal cell carcinoma. The side effects include hepatotoxicity. It was discontinued per my conversation with pharmacy. The patient was also given IV iron yesterday, not quite sure if this played a part. We'll order a viral hepatitis panel for evaluation.  History of Barrett's esophagitis and history of diverticulosis The patient underwent an EGD by [name] on 01/19/15. The results revealed an abnormal esophagus consistent with Barrett's  esophagus. He had a colonoscopy in 2004 secondary to rectal bleeding and was found to have diverticulosis-bleeding control therapy was given. The patient reports black tarry stools prior to being given IV iron following admission. We will guaiac his stools and continue PPI therapy.  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 The patient's hemoglobin was 7.8 on admission. 3 weeks ago was 8.0. One month ago it was 9.6. He reports black tarry stools 1 day, but no bright red blood per rectum and no bloody urine. We'll continue PPI every 12 hours. We'll transfuse him 2 units of packed red blood cells. Consider inpatient GI consult if his hemoglobin does not improve with blood transfusion; otherwise, outpatient therapy would be appropriate.  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 The patient has a known history of renal cell carcinoma in the right kidney, status post radical nephrectomy 2010. He now  has a left kidney mass which appears to be a new cancer, diagnosed in 2013. This was known to have metastasized to the pancreas. MRI of his abdomen during this admission reveals the same. He is followed by oncologist Dr. Nicola Girt. He was started on Vottrient in 2013. It was held 11/2012 for increase in LFTs, but were resumed in April 2014. It is currently being held secondary to an increase in his LFTs. -There was a suspicion of a perirectal abscess approximately 4 weeks ago, but the MRI during this admission does not appear to show this.  Acute kidney injury. The patient's creatinine was within normal limits approximately 4-5 weeks ago. It was 1.42 on admission. It has increased to 1.63, possibly prerenal. We will continue IV fluid hydration and follow his renal function.  Hypokalemia. We'll replete orally and in the IV fluids.     Time spent: 40 minutes.    [facility] Pager [phone]. If 7PM-7AM, please contact  night-coverage at [email or web address], password [facility] 02/12/2015, 1:24 PM  LOS: 1 day

## 2015-02-12 NOTE — Consult Note (Signed)
Reason for Consult: Right upper quadrant abdominal pain Referring Physician: Hospitalist  Jeffrey Trujillo is an 64 y.o. male.  HPI: Patient is a 64 year old white male with a history of renal cell carcinoma, status post right nephrectomy in the past was also told he had metastatic disease to the pancreas and the left kidney, who presents with a 24-hour history of worsening right upper quadrant abdominal pain. This was accompanied with nausea. The pain would then subside but soon returned. CT scan of the abdomen revealed cholelithiasis. Liver enzyme tests were not too remarkable. He did have some abdominal pain yesterday evening. This morning, he has minimal right upper quadrant abdominal pain, but his liver enzyme tests have significantly increased. He is on a chemotherapeutic agent called Votrient which has the side effect of causing hepatotoxicity. He also states he has had black tarry stools, having had an EGD and colonoscopy by [name] in the past. He has a known history of anemia.  Past Medical History  Diagnosis Date  . Gout   . DJD (degenerative joint disease) of cervical spine   . Diverticulosis of colon     by colonoscopy 11/02/2009  . Barrett's esophagus     by EGD 11/02/2009  . Anemia   . Cancer     "was told has cancer on other kidney and spot on pancreas"  . 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 GERD (gastroesophageal reflux disease)     Past Surgical History  Procedure Laterality Date  . Nephrectomy      right due to cancer  . Lung removal, partial      left lower lobe, benign  . Eus  08/07/2012    Procedure: UPPER ENDOSCOPIC ULTRASOUND (EUS) LINEAR;  Surgeon: [name], MD;  Location: [facility];  Service: Endoscopy;  Laterality: N/A;  . Inguinal hernia repair    . Hernia repair    . Inguinal hernia repair      Right  . Eus  08/21/2012    Procedure: UPPER ENDOSCOPIC ULTRASOUND (EUS) LINEAR;  Surgeon: [name], MD;  Location: [facility];   Service: Endoscopy;  Laterality: N/A;  . Colonoscopy  Feb 2011    [name]: single anal papilla, pan-colonic diverticula  . Esophagogastroduodenoscopy  Feb 2011    [name]: salmon-colored epithelium consistent with Barrett's, nodular antral erosions, small hiatal hernia, negative H.pylori  . Colonoscopy  09/25/2012    RMR: Colonic diverticulosis. Suspect diverticular bleeding-hepatic flexure tic-sealed  . Esophagogastroduodenoscopy  09/25/2012    [facility]:DJSHFWY'O esophagus. Small hiatal hernia. Antral erosions of doubtful clinical significance, no biopsies  . Esophagogastroduodenoscopy N/A 03/11/2013    SLF: Barrett's esophagus/Single ulcer in the gastric antrum-PROBABLE SOURCE FOR BLACK STOOL/ Chronic gastritis in the gastric antrum  . Esophagogastroduodenoscopy N/A 10/11/2014    Dr. [name] barrett's s/p biopsy. Hiatal hernia. Biopsy with intestinal metaplasia, glandular atypia indefinite for low grade dysplasia noted as well  . Esophagogastroduodenoscopy N/A 01/19/2015    Procedure: ESOPHAGOGASTRODUODENOSCOPY (EGD);  Surgeon: [name], MD;  Location: [facility];  Service: Endoscopy;  Laterality: N/A;  200    Family History  Problem Relation Age of Onset  . Lung cancer Brother   . Prostate cancer Brother   . Colon cancer Neg Hx   . Lumbar disc disease Brother   . Prostate cancer Brother     Social History:  reports that he has quit smoking. His smoking use included Cigars. He has never used smokeless  tobacco. He reports that he drinks about 1.2 - 1.8 oz of alcohol per week. He reports that he does not use illicit drugs.  Allergies:  Allergies  Allergen Reactions  . Nsaids Other (See Comments)    Patient only has one kidney.   Marland Kitchen Amoxicillin     n/v  . Demerol [Meperidine] Rash  . Meperidine Hcl Hives and Rash    Medications: I have reviewed the patient's current medications.  Results for orders placed or performed during the hospital encounter of 02/11/15 (from  the past 48 hour(s))  Urinalysis, Routine w reflex microscopic     Status: Abnormal   Collection Time: 02/11/15  2:09 PM  Result Value Ref Range   Color, Urine YELLOW YELLOW   APPearance CLEAR CLEAR   Specific Gravity, Urine 1.020 1.005 - 1.030   pH 6.0 5.0 - 8.0   Glucose, UA NEGATIVE NEGATIVE mg/dL   Hgb urine dipstick NEGATIVE NEGATIVE   Bilirubin Urine NEGATIVE NEGATIVE   Ketones, ur NEGATIVE NEGATIVE mg/dL   Protein, ur NEGATIVE NEGATIVE mg/dL   Urobilinogen, UA 4.0 (H) 0.0 - 1.0 mg/dL   Nitrite NEGATIVE NEGATIVE   Leukocytes, UA NEGATIVE NEGATIVE    Comment: MICROSCOPIC NOT DONE ON URINES WITH NEGATIVE PROTEIN, BLOOD, LEUKOCYTES, NITRITE, OR GLUCOSE <1000 mg/dL.  CBC with Differential     Status: Abnormal   Collection Time: 02/11/15  2:35 PM  Result Value Ref Range   WBC 4.3 4.0 - 10.5 K/uL   RBC 2.46 (L) 4.22 - 5.81 MIL/uL   Hemoglobin 7.8 (L) 13.0 - 17.0 g/dL   HCT 24.0 (L) 39.0 - 52.0 %   MCV 97.6 78.0 - 100.0 fL   MCH 31.7 26.0 - 34.0 pg   MCHC 32.5 30.0 - 36.0 g/dL   RDW 17.6 (H) 11.5 - 15.5 %   Platelets 200 150 - 400 K/uL   Neutrophils Relative % 85 (H) 43 - 77 %   Neutro Abs 3.7 1.7 - 7.7 K/uL   Lymphocytes Relative 6 (L) 12 - 46 %   Lymphs Abs 0.3 (L) 0.7 - 4.0 K/uL   Monocytes Relative 7 3 - 12 %   Monocytes Absolute 0.3 0.1 - 1.0 K/uL   Eosinophils Relative 1 0 - 5 %   Eosinophils Absolute 0.0 0.0 - 0.7 K/uL   Basophils Relative 1 0 - 1 %   Basophils Absolute 0.0 0.0 - 0.1 K/uL  Comprehensive metabolic panel     Status: Abnormal   Collection Time: 02/11/15  2:35 PM  Result Value Ref Range   Sodium 138 135 - 145 mmol/L   Potassium 3.2 (L) 3.5 - 5.1 mmol/L   Chloride 110 101 - 111 mmol/L   CO2 20 (L) 22 - 32 mmol/L   Glucose, Bld 106 (H) 65 - 99 mg/dL   BUN 19 6 - 20 mg/dL   Creatinine, Ser 1.42 (H) 0.61 - 1.24 mg/dL   Calcium 8.4 (L) 8.9 - 10.3 mg/dL   Total Protein 6.7 6.5 - 8.1 g/dL   Albumin 3.0 (L) 3.5 - 5.0 g/dL   AST 168 (H) 15 - 41 U/L   ALT  55 17 - 63 U/L   Alkaline Phosphatase 344 (H) 38 - 126 U/L   Total Bilirubin 1.3 (H) 0.3 - 1.2 mg/dL   GFR calc non Af Amer 51 (L) >60 mL/min   GFR calc Af Amer 59 (L) >60 mL/min    Comment: (NOTE) The eGFR has been calculated using the CKD EPI  equation. This calculation has not been validated in all clinical situations. eGFR's persistently <60 mL/min signify possible Chronic Kidney Disease.    Anion gap 8 5 - 15  Lipase, blood     Status: None   Collection Time: 02/11/15  2:35 PM  Result Value Ref Range   Lipase 37 22 - 51 U/L  Troponin I     Status: None   Collection Time: 02/11/15  2:35 PM  Result Value Ref Range   Troponin I <0.03 <0.031 ng/mL    Comment:        NO INDICATION OF MYOCARDIAL INJURY.   I-Stat CG4 Lactic Acid, ED     Status: None   Collection Time: 02/11/15  2:49 PM  Result Value Ref Range   Lactic Acid, Venous 0.94 0.5 - 2.0 mmol/L  POC occult blood, ED     Status: Abnormal   Collection Time: 02/11/15  4:07 PM  Result Value Ref Range   Fecal Occult Bld POSITIVE (A) NEGATIVE  CBC     Status: Abnormal   Collection Time: 02/12/15  5:49 AM  Result Value Ref Range   WBC 3.5 (L) 4.0 - 10.5 K/uL   RBC 2.41 (L) 4.22 - 5.81 MIL/uL   Hemoglobin 7.6 (L) 13.0 - 17.0 g/dL   HCT 23.8 (L) 39.0 - 52.0 %   MCV 98.8 78.0 - 100.0 fL   MCH 31.5 26.0 - 34.0 pg   MCHC 31.9 30.0 - 36.0 g/dL   RDW 17.9 (H) 11.5 - 15.5 %   Platelets 168 150 - 400 K/uL  Basic metabolic panel     Status: Abnormal   Collection Time: 02/12/15  5:49 AM  Result Value Ref Range   Sodium 139 135 - 145 mmol/L   Potassium 3.4 (L) 3.5 - 5.1 mmol/L   Chloride 106 101 - 111 mmol/L   CO2 23 22 - 32 mmol/L   Glucose, Bld 83 65 - 99 mg/dL   BUN 17 6 - 20 mg/dL   Creatinine, Ser 1.63 (H) 0.61 - 1.24 mg/dL   Calcium 8.3 (L) 8.9 - 10.3 mg/dL   GFR calc non Af Amer 43 (L) >60 mL/min   GFR calc Af Amer 50 (L) >60 mL/min    Comment: (NOTE) The eGFR has been calculated using the CKD EPI equation. This  calculation has not been validated in all clinical situations. eGFR's persistently <60 mL/min signify possible Chronic Kidney Disease.    Anion gap 10 5 - 15  Hepatic function panel     Status: Abnormal   Collection Time: 02/12/15  5:49 AM  Result Value Ref Range   Total Protein 6.0 (L) 6.5 - 8.1 g/dL   Albumin 2.6 (L) 3.5 - 5.0 g/dL   AST 343 (H) 15 - 41 U/L   ALT 156 (H) 17 - 63 U/L   Alkaline Phosphatase 350 (H) 38 - 126 U/L   Total Bilirubin 3.0 (H) 0.3 - 1.2 mg/dL   Bilirubin, Direct 2.0 (H) 0.1 - 0.5 mg/dL   Indirect Bilirubin 1.0 (H) 0.3 - 0.9 mg/dL  Type and screen     Status: None (Preliminary result)   Collection Time: 02/12/15  9:34 AM  Result Value Ref Range   ABO/RH(D) B NEG    Antibody Screen PENDING    Sample Expiration 02/15/2015   Prepare RBC     Status: None   Collection Time: 02/12/15  9:34 AM  Result Value Ref Range   Order Confirmation ORDER PROCESSED BY BLOOD  BANK     Ct Abdomen Pelvis Wo Contrast  02/11/2015   CLINICAL DATA:  Sudden onset of generalized abdominal pain  EXAM: CT ABDOMEN AND PELVIS WITHOUT CONTRAST  TECHNIQUE: Multidetector CT imaging of the abdomen and pelvis was performed following the standard protocol without IV contrast.  COMPARISON:  01/18/2015  FINDINGS: Lower chest: There is no pleural effusion identified. The lung bases are clear.  Hepatobiliary: There is hepatic steatosis noted. There is a small low density structure along the dome of liver measuring 6 mm. This is too small to characterize. Small stone within the dependent portion of the gallbladder.  Pancreas: Pancreas is negative  Spleen: Negative  Adrenals/Urinary Tract: Normal appearence of the adrenal glands. Status post right nephrectomy. Cyst noted arising from upper pole of left kidney. Mass arising from upper pole of left kidney measures 4.2 cm, image 28/series 2. This is not significantly changed in size from recent exam. There is mild left pelvocaliectasis and hydroureter. No  obstructing stone identified. The urinary bladder is normal.  Stomach/Bowel: Small hiatal hernia noted. The stomach is within normal limits. The small bowel loops have a normal course and caliber. No obstruction. Multiple distal colonic diverticula noted without acute inflammation.  Vascular/Lymphatic: Normal appearance of the aorta. No enlarged retroperitoneal or mesenteric adenopathy. No enlarged pelvic or inguinal lymph nodes.  Reproductive: Prostate gland and seminal vesicles are unremarkable.  Other: There is no ascites or focal fluid collections within the abdomen or pelvis.  Musculoskeletal: Degenerative disc disease noted at the L5-S1 level.  IMPRESSION: 1. Mild left pelvocaliectasis and hydroureter without evidence for obstructing stone. 2. Again noted is of mass arising from the upper pole the left kidney. This is compatible with a renal cell carcinoma. Not significantly changed from 01/18/2015. 3. Gallstone. 4. Previous right nephrectomy.   Electronically Signed   By: Kerby Moors M.D.   On: 02/11/2015 18:07    ROS: See chart Blood pressure 131/49, pulse 64, temperature 97.6 F (36.4 C), temperature source Oral, resp. rate 20, height _0  (1.803 m), weight 75.6 kg (166 lb 10.7 oz), SpO2 98 %. Physical Exam: Pleasant white male in no acute distress. Abdomen soft with minimal tenderness in the right upper quadrant to deep palpation. No hepatosplenomegaly or masses noted.  Assessment/Plan: Impression: Cholelithiasis, worsening liver enzyme tests, history of metastatic renal cell carcinoma Plan: We'll get urgent ultrasound the gallbladder to assess the hepatobiliary tree. Patient may also need an MRCP and GI consultation.  Lindzy Rupert A 02/12/2015, 10:24 AM

## 2015-02-12 NOTE — Progress Notes (Signed)
ANTIBIOTIC CONSULT NOTE  Pharmacy Consult for Zosyn Indication: intra-abdominal infection  Allergies  Allergen Reactions  . Nsaids Other (See Comments)    Patient only has one kidney.   Marland Kitchen Amoxicillin     n/v  . Demerol [Meperidine] Rash  . Meperidine Hcl Hives and Rash    Patient Measurements: Height: 5\' 11"  (180.3 cm) Weight: 166 lb 10.7 oz (75.6 kg) IBW/kg (Calculated) : 75.3  Vital Signs: Temp: 97.6 F (36.4 C) (05/28 0451) Temp Source: Oral (05/28 0451) BP: 131/49 mmHg (05/28 0451) Pulse Rate: 64 (05/28 0451) Intake/Output from previous day: 05/27 0701 - 05/28 0700 In: 1000 [I.V.:1000] Out: 2 [Emesis/NG output:2] Intake/Output from this shift:    Labs:  Recent Labs  02/11/15 1435 02/12/15 0549  WBC 4.3 3.5*  HGB 7.8* 7.6*  PLT 200 168  CREATININE 1.42* 1.63*   Estimated Creatinine Clearance: 49.4 mL/min (by C-G formula based on Cr of 1.63). No results for input(s): VANCOTROUGH, VANCOPEAK, VANCORANDOM, GENTTROUGH, GENTPEAK, GENTRANDOM, TOBRATROUGH, TOBRAPEAK, TOBRARND, AMIKACINPEAK, AMIKACINTROU, AMIKACIN in the last 72 hours.   Microbiology: No results found for this or any previous visit (from the past 720 hour(s)).  Anti-infectives    Start     Dose/Rate Route Frequency Ordered Stop   02/12/15 1200  piperacillin-tazobactam (ZOSYN) IVPB 3.375 g     3.375 g 12.5 mL/hr over 240 Minutes Intravenous Every 8 hours 02/12/15 0811     02/12/15 0330  piperacillin-tazobactam (ZOSYN) IVPB 3.375 g     3.375 g 12.5 mL/hr over 240 Minutes Intravenous  Once 02/11/15 2344     02/11/15 1915  piperacillin-tazobactam (ZOSYN) IVPB 3.375 g     3.375 g 12.5 mL/hr over 240 Minutes Intravenous  Once 02/11/15 1913 02/11/15 2037      Assessment: 64 yo M admitted with abdominal pain.  Surgery consulted for suspected cholecystitis.   He was febrile on admission (Tm 102.76F).   Scr is elevated above patient's baseline and trending up.  Estimated CrCl > 81ml/min.  Goal of  Therapy:  Eradicate infection.  Plan:  Zosyn 3.375gm IV Q8h to be infused over 4hrs Monitor renal function and cx data  Duration of therapy per MD  Biagio Borg 02/12/2015,8:15 AM

## 2015-02-12 NOTE — Progress Notes (Signed)
Initial Nutrition Assessment  DOCUMENTATION CODES: Non-severe (moderate) malnutrition in context of acute illness/injury  INTERVENTION: Ensure Enlive PO BID (each supplement provides 350kcal and 20 grams of protein)  NUTRITION DIAGNOSIS: Inadequate oral intake related to nausea/vomiting and severe abdominal pains as evidenced by an estimated oral intake of <50% estimated needs in > 5 days  GOAL: Patient will meet greater than or equal to 90% of their needs  MONITOR: Supplement acceptance, Labs, I & O's, PO intake  REASON FOR ASSESSMENT: Malnutrition Screening Tool   ASSESSMENT: 64 y.o. male PMHx metastic renal cancer, s/p nephrectomy and GERD. Patient with intermittent abdominal pain in the epigastric area over the past several months. Yesterday, the pain was extremely severe and unremitting. The patient had profuse sweating, nausea, vomiting and he was intolerant to food.   Per notes, pt with intra-abdominal infection. Pt weight measurements of >170 seem to be outliers. EE BID  Height: Ht Readings from Last 1 Encounters:  02/11/15 5\' 11"  (1.803 m)    Weight: Wt Readings from Last 1 Encounters:  02/11/15 166 lb 10.7 oz (75.6 kg)    Ideal Body Weight:  78.2 kg  Wt Readings from Last 10 Encounters:  02/11/15 166 lb 10.7 oz (75.6 kg)  01/20/15 171 lb 12.8 oz (77.928 kg)  01/17/15 172 lb (78.019 kg)  01/03/15 168 lb (76.204 kg)  12/07/14 168 lb 1.6 oz (76.25 kg)  10/11/14 172 lb (78.019 kg)  10/04/14 172 lb (78.019 kg)  10/01/14 172 lb 4.8 oz (78.155 kg)  09/24/14 166 lb 3.2 oz (75.388 kg)  08/27/14 171 lb 12.8 oz (77.928 kg)  UBW appears to be ~170 lbs . Admit weight 161 (73.2 kg) dosing  BMI:  Body mass index is 23.26 kg/(m^2).  Estimated Nutritional Needs: Kcal:  1850-2050 (25-28 kcal/kg) Protein:  88-102 g (1.2-1.4 g/kg) Fluid:  1.9-2.1 liters  Skin: Nothing documented   Diet Order:  Diet NPO time specified  EDUCATION NEEDS: No education needs  identified at this time   Intake/Output Summary (Last 24 hours) at 02/12/15 0949 Last data filed at 02/12/15 0400  Gross per 24 hour  Intake   1000 ml  Output      2 ml  Net    998 ml   Last BM: 5/28  Burtis Junes RD, LDN Nutrition Pager: [phone] 02/12/2015 9:49 AM

## 2015-02-13 LAB — CBC
HCT: 27.5 % — ABNORMAL LOW (ref 39.0–52.0)
HEMOGLOBIN: 9 g/dL — AB (ref 13.0–17.0)
MCH: 30.8 pg (ref 26.0–34.0)
MCHC: 32.7 g/dL (ref 30.0–36.0)
MCV: 94.2 fL (ref 78.0–100.0)
Platelets: 168 10*3/uL (ref 150–400)
RBC: 2.92 MIL/uL — ABNORMAL LOW (ref 4.22–5.81)
RDW: 18.6 % — AB (ref 11.5–15.5)
WBC: 4.6 10*3/uL (ref 4.0–10.5)

## 2015-02-13 LAB — TYPE AND SCREEN
ABO/RH(D): B NEG
Antibody Screen: NEGATIVE
Unit division: 0
Unit division: 0

## 2015-02-13 LAB — HEPATIC FUNCTION PANEL
ALBUMIN: 2.4 g/dL — AB (ref 3.5–5.0)
ALK PHOS: 281 U/L — AB (ref 38–126)
ALT: 110 U/L — AB (ref 17–63)
AST: 135 U/L — ABNORMAL HIGH (ref 15–41)
BILIRUBIN DIRECT: 3.2 mg/dL — AB (ref 0.1–0.5)
Indirect Bilirubin: 1.4 mg/dL — ABNORMAL HIGH (ref 0.3–0.9)
TOTAL PROTEIN: 5.7 g/dL — AB (ref 6.5–8.1)
Total Bilirubin: 4.6 mg/dL — ABNORMAL HIGH (ref 0.3–1.2)

## 2015-02-13 LAB — BASIC METABOLIC PANEL
ANION GAP: 8 (ref 5–15)
BUN: 11 mg/dL (ref 6–20)
CO2: 23 mmol/L (ref 22–32)
Calcium: 8.6 mg/dL — ABNORMAL LOW (ref 8.9–10.3)
Chloride: 105 mmol/L (ref 101–111)
Creatinine, Ser: 1.51 mg/dL — ABNORMAL HIGH (ref 0.61–1.24)
GFR calc Af Amer: 55 mL/min — ABNORMAL LOW (ref >60)
GFR calc non Af Amer: 47 mL/min — ABNORMAL LOW (ref >60)
GLUCOSE: 100 mg/dL — AB (ref 65–99)
Potassium: 3.6 mmol/L (ref 3.5–5.1)
SODIUM: 136 mmol/L (ref 135–145)

## 2015-02-13 MED ORDER — SACCHAROMYCES BOULARDII 250 MG PO CAPS
250.0000 mg | ORAL_CAPSULE | Freq: Every day | ORAL | Status: DC
  Administered 2015-02-13 – 2015-02-15 (×3): 250 mg via ORAL
  Filled 2015-02-13 (×3): qty 1

## 2015-02-13 MED ORDER — POTASSIUM CHLORIDE CRYS ER 20 MEQ PO TBCR
20.0000 meq | EXTENDED_RELEASE_TABLET | Freq: Every day | ORAL | Status: DC
  Administered 2015-02-13 – 2015-02-15 (×3): 20 meq via ORAL
  Filled 2015-02-13 (×3): qty 1

## 2015-02-13 NOTE — Progress Notes (Signed)
Subjective: Patient states her abdominal pain has resolved. He is hungry. He has had multiple bowel movements.  Objective: Vital signs in last 24 hours: Temp:  [98.5 F (36.9 C)-100 F (37.8 C)] 98.5 F (36.9 C) (05/29 0504) Pulse Rate:  [45-75] 45 (05/29 0504) Resp:  [18-20] 20 (05/29 0504) BP: (121-164)/(53-68) 152/68 mmHg (05/29 0504) SpO2:  [95 %-100 %] 100 % (05/29 0504) Last BM Date: 02/12/15  Intake/Output from previous day: 05/28 0701 - 05/29 0700 In: 1642.5 [P.O.:480; I.V.:107.5; Blood:1005; IV Piggyback:50] Out: 250 [Urine:250] Intake/Output this shift:    General appearance: alert, cooperative and no distress GI: Soft, no specific tenderness in the right upper quadrant to palpation. No rigidity noted.  Lab Results:   Recent Labs  02/12/15 1748 02/13/15 0548  WBC 4.7 4.6  HGB 9.0* 9.0*  HCT 27.3* 27.5*  PLT 158 168   BMET  Recent Labs  02/12/15 0549 02/13/15 0548  NA 139 136  K 3.4* 3.6  CL 106 105  CO2 23 23  GLUCOSE 83 100*  BUN 17 11  CREATININE 1.63* 1.51*  CALCIUM 8.3* 8.6*   PT/INR No results for input(s): LABPROT, INR in the last 72 hours.  Studies/Results: Ct Abdomen Pelvis Wo Contrast  02/11/2015   CLINICAL DATA:  Sudden onset of generalized abdominal pain  EXAM: CT ABDOMEN AND PELVIS WITHOUT CONTRAST  TECHNIQUE: Multidetector CT imaging of the abdomen and pelvis was performed following the standard protocol without IV contrast.  COMPARISON:  01/18/2015  FINDINGS: Lower chest: There is no pleural effusion identified. The lung bases are clear.  Hepatobiliary: There is hepatic steatosis noted. There is a small low density structure along the dome of liver measuring 6 mm. This is too small to characterize. Small stone within the dependent portion of the gallbladder.  Pancreas: Pancreas is negative  Spleen: Negative  Adrenals/Urinary Tract: Normal appearence of the adrenal glands. Status post right nephrectomy. Cyst noted arising from upper  pole of left kidney. Mass arising from upper pole of left kidney measures 4.2 cm, image 28/series 2. This is not significantly changed in size from recent exam. There is mild left pelvocaliectasis and hydroureter. No obstructing stone identified. The urinary bladder is normal.  Stomach/Bowel: Small hiatal hernia noted. The stomach is within normal limits. The small bowel loops have a normal course and caliber. No obstruction. Multiple distal colonic diverticula noted without acute inflammation.  Vascular/Lymphatic: Normal appearance of the aorta. No enlarged retroperitoneal or mesenteric adenopathy. No enlarged pelvic or inguinal lymph nodes.  Reproductive: Prostate gland and seminal vesicles are unremarkable.  Other: There is no ascites or focal fluid collections within the abdomen or pelvis.  Musculoskeletal: Degenerative disc disease noted at the L5-S1 level.  IMPRESSION: 1. Mild left pelvocaliectasis and hydroureter without evidence for obstructing stone. 2. Again noted is of mass arising from the upper pole the left kidney. This is compatible with a renal cell carcinoma. Not significantly changed from 01/18/2015. 3. Gallstone. 4. Previous right nephrectomy.   Electronically Signed   By: Kerby Moors M.D.   On: 02/11/2015 18:07   Mr 3d Recon At Scanner  02/12/2015   CLINICAL DATA:  Upper abdominal pain.  EXAM: MRI ABDOMEN WITHOUT AND WITH CONTRAST (INCLUDING MRCP)  TECHNIQUE: Multiplanar multisequence MR imaging of the abdomen was performed both before and after the administration of intravenous contrast. Heavily T2-weighted images of the biliary and pancreatic ducts were obtained, and three-dimensional MRCP images were rendered by post processing.  CONTRAST:  26mL MULTIHANCE GADOBENATE  DIMEGLUMINE 529 MG/ML IV SOLN  COMPARISON:  02/11/2015  FINDINGS: Lower chest:  No pleural or pericardial effusion noted.  Hepatobiliary: Diffuse low T2 signal throughout the liver is identified which likely reflects  susceptibility due to iron deposition within the liver hand or therapy with ferriheme. No focal liver abnormality noted. The gallbladder appears normal. There is no biliary dilatation. Normal caliber of the common bile duct. There is a small cyst noted within the left lobe measuring 6 mm.  Pancreas: Within the head of pancreas, in the pancreaticoduodenal groove, the previously noted metastasis measures 0.9 cm, image 51 of series 5004. This is decreased in size from MRI dated 07/18/2012 when it measured 2.6 cm. No new areas of pancreatic metastasis identified. No focal pancreas abnormality identified. Small soft tissue attenuating nodule ventral to the tail of pancreas likely represents a splenule measuring 1.2 cm.  Spleen: The spleen measures 14.5 cm in length, image 13 of series 4. There is a diffuse abnormal low T2 signal throughout the spleen.  Adrenals/Urinary Tract: Normal appearance of the adrenal glands. Previous right nephrectomy. Cyst is noted within the upper pole of the left kidney measuring 4 cm, image 22/series 5. Also within the upper pole the left kidney is an enhancing solid mass measuring 5.3 cm and compatible with a renal cell carcinoma. Due therapy with Ferriheme, enhancement characteristics of the left kidney renal cell carcinoma and left renal vein are difficult to accurately assessed.  Stomach/Bowel: The stomach appears within normal limits. The small bowel loops appear mildly increased in caliber measuring up to 2.8 cm. The visualized portions of the colon are unremarkable.  Vascular/Lymphatic: On the precontrast images there is increased signal throughout the blood pool. This presumably reflects treatment with Ferriheme. No aneurysm identified. There is no upper abdominal adenopathy.  Other: No free fluid or fluid collections identified within the upper abdomen.  Musculoskeletal: There is abnormal decreased T2 and T1 signal throughout the bone marrow. This may be related to patient's anemia  and or treatment with Ferriheme.  IMPRESSION: 1. Exam detail is diminished due to altered signal secondary to Ferriheme therapy. 2. No acute findings noted and no explanation for upper abdominal pain. Specifically no evidence for gallstones or biliary a tract obstruction. 3. Solid mass within the upper pole of left kidney consistent with a renal cell carcinoma. 4. Splenomegaly. 5. 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   Electronically Signed   By: Kerby Moors M.D.   On: 02/12/2015 12:49   Mr Abd W/wo Cm/mrcp  02/12/2015   CLINICAL DATA:  Upper abdominal pain.  EXAM: MRI ABDOMEN WITHOUT AND WITH CONTRAST (INCLUDING MRCP)  TECHNIQUE: Multiplanar multisequence MR imaging of the abdomen was performed both before and after the administration of intravenous contrast. Heavily T2-weighted images of the biliary and pancreatic ducts were obtained, and three-dimensional MRCP images were rendered by post processing.  CONTRAST:  65mL MULTIHANCE GADOBENATE DIMEGLUMINE 529 MG/ML IV SOLN  COMPARISON:  02/11/2015  FINDINGS: Lower chest:  No pleural or pericardial effusion noted.  Hepatobiliary: Diffuse low T2 signal throughout the liver is identified which likely reflects susceptibility due to iron deposition within the liver hand or therapy with ferriheme. No focal liver abnormality noted. The gallbladder appears normal. There is no biliary dilatation. Normal caliber of the common bile duct. There is a small cyst noted within the left lobe measuring 6 mm.  Pancreas: Within the head of pancreas, in the pancreaticoduodenal groove, the previously noted metastasis measures  0.9 cm, image 51 of series 5004. This is decreased in size from MRI dated 07/18/2012 when it measured 2.6 cm. No new areas of pancreatic metastasis identified. No focal pancreas abnormality identified. Small soft tissue attenuating nodule ventral to the tail of pancreas likely represents a splenule measuring 1.2 cm.   Spleen: The spleen measures 14.5 cm in length, image 13 of series 4. There is a diffuse abnormal low T2 signal throughout the spleen.  Adrenals/Urinary Tract: Normal appearance of the adrenal glands. Previous right nephrectomy. Cyst is noted within the upper pole of the left kidney measuring 4 cm, image 22/series 5. Also within the upper pole the left kidney is an enhancing solid mass measuring 5.3 cm and compatible with a renal cell carcinoma. Due therapy with Ferriheme, enhancement characteristics of the left kidney renal cell carcinoma and left renal vein are difficult to accurately assessed.  Stomach/Bowel: The stomach appears within normal limits. The small bowel loops appear mildly increased in caliber measuring up to 2.8 cm. The visualized portions of the colon are unremarkable.  Vascular/Lymphatic: On the precontrast images there is increased signal throughout the blood pool. This presumably reflects treatment with Ferriheme. No aneurysm identified. There is no upper abdominal adenopathy.  Other: No free fluid or fluid collections identified within the upper abdomen.  Musculoskeletal: There is abnormal decreased T2 and T1 signal throughout the bone marrow. This may be related to patient's anemia and or treatment with Ferriheme.  IMPRESSION: 1. Exam detail is diminished due to altered signal secondary to Ferriheme therapy. 2. No acute findings noted and no explanation for upper abdominal pain. Specifically no evidence for gallstones or biliary a tract obstruction. 3. Solid mass within the upper pole of left kidney consistent with a renal cell carcinoma. 4. Splenomegaly. 5. 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   Electronically Signed   By: Kerby Moors M.D.   On: 02/12/2015 12:49   US Abdomen Limited Ruq  02/12/2015   CLINICAL DATA:  Elevated liver function tests  EXAM: US ABDOMEN LIMITED - RIGHT UPPER QUADRANT  COMPARISON:  CT 02/11/2015  FINDINGS: Gallbladder:   No gallstones or wall thickening visualized. No sonographic Murphy sign noted.  Common bile duct:  Diameter: 5 mm  Liver:  No focal lesion identified. Upper limits of normal in parenchymal echogenicity.  IMPRESSION: Mildly increased hepatic echogenicity which is nonspecific and could indicate steatosis or other infiltrative process.   Electronically Signed   By: Conchita Paris M.D.   On: 02/12/2015 11:06    Anti-infectives: Anti-infectives    Start     Dose/Rate Route Frequency Ordered Stop   02/12/15 1200  piperacillin-tazobactam (ZOSYN) IVPB 3.375 g     3.375 g 12.5 mL/hr over 240 Minutes Intravenous Every 8 hours 02/12/15 0811     02/12/15 0330  piperacillin-tazobactam (ZOSYN) IVPB 3.375 g     3.375 g 12.5 mL/hr over 240 Minutes Intravenous  Once 02/11/15 2344 02/12/15 0819   02/11/15 1915  piperacillin-tazobactam (ZOSYN) IVPB 3.375 g     3.375 g 12.5 mL/hr over 240 Minutes Intravenous  Once 02/11/15 1913 02/11/15 2037      Assessment/Plan: Impression: Elevated liver enzyme tests. Both ultrasound and MRCP were negative for cholelithiasis. Common bile duct was within normal limits. No surgical pathology for me to address. Discussed with Dr. Caryn Section. We'll advance diet. Will follow peripherally with you.  LOS: 2 days    Whalen Trompeter A 02/13/2015

## 2015-02-13 NOTE — Progress Notes (Signed)
[name]  KADIR AZUCENA DJM:[id number] DOB: [birth year] DOA: 02/11/2015 PCP: [name], MD    Code Status: Full code Family Communication: Discussed with wife Disposition Plan: Discharge when clinically appropriate   Consultants:  Gen. surgery, [name]  Procedures:  None  Antibiotics:  Zosyn 5/27>>  HPI/Subjective: Patient reports no complaints of abdominal pain, nausea, vomiting, but he does report frequent loose stools this morning. Denies tarry stools or bright red blood per rectum. He once his diet advanced.  Objective: Filed Vitals:   02/13/15 0504  BP: 152/68  Pulse: 45  Temp: 98.5 F (36.9 C)  Resp: 20   oxygen saturation 100%.  Intake/Output Summary (Last 24 hours) at 02/13/15 1457 Last data filed at 02/13/15 0505  Gross per 24 hour  Intake 1307.5 ml  Output    250 ml  Net 1057.5 ml   Filed Weights   02/11/15 1352 02/11/15 2228  Weight: 73.029 kg (161 lb) 75.6 kg (166 lb 10.7 oz)    Exam:   General:  Debilitated appearing, but alert 64 year old man in no acute distress. Mildly jaundiced.  Cardiovascular: S1, S2, with 1 to 2/6 systolic murmur.  Respiratory: Decreased breath sounds at bases, otherwise clear.  Abdomen: Positive bowel sounds, soft, mildly tender in the epigastrium; no masses palpated; no appreciable distention.  Musculoskeletal/extremities: No acute hot red joints. No pedal edema.  Neurologic: He is alert and oriented 3. His speech is clear.  Data Reviewed: Basic Metabolic Panel:  Recent Labs Lab 02/11/15 1435 02/12/15 0549 02/13/15 0548  NA 138 139 136  K 3.2* 3.4* 3.6  CL 110 106 105  CO2 20* 23 23  GLUCOSE 106* 83 100*  BUN 19 17 11   CREATININE 1.42* 1.63* 1.51*  CALCIUM 8.4* 8.3* 8.6*   Liver Function Tests:  Recent Labs Lab 02/11/15 1435 02/12/15 0549 02/13/15 0548  AST 168* 343* 135*  ALT 55 156* 110*  ALKPHOS 344* 350* 281*  BILITOT 1.3* 3.0* 4.6*  PROT 6.7 6.0*  5.7*  ALBUMIN 3.0* 2.6* 2.4*    Recent Labs Lab 02/11/15 1435  LIPASE 37   No results for input(s): AMMONIA in the last 168 hours. CBC:  Recent Labs Lab 02/11/15 1435 02/12/15 0549 02/12/15 1748 02/13/15 0548  WBC 4.3 3.5* 4.7 4.6  NEUTROABS 3.7  --   --   --   HGB 7.8* 7.6* 9.0* 9.0*  HCT 24.0* 23.8* 27.3* 27.5*  MCV 97.6 98.8 93.5 94.2  PLT 200 168 158 168   Cardiac Enzymes:  Recent Labs Lab 02/11/15 1435  TROPONINI <0.03   BNP (last 3 results)  Recent Labs  01/03/15 1043  BNP 57.0    ProBNP (last 3 results) No results for input(s): PROBNP in the last 8760 hours.  CBG: No results for input(s): GLUCAP in the last 168 hours.  No results found for this or any previous visit (from the past 240 hour(s)).   Studies: Ct Abdomen Pelvis Wo Contrast  02/11/2015   CLINICAL DATA:  Sudden onset of generalized abdominal pain  EXAM: CT ABDOMEN AND PELVIS WITHOUT CONTRAST  TECHNIQUE: Multidetector CT imaging of the abdomen and pelvis was performed following the standard protocol without IV contrast.  COMPARISON:  01/18/2015  FINDINGS: Lower chest: There is no pleural effusion identified. The lung bases are clear.  Hepatobiliary: There is hepatic steatosis noted. There is a small low density structure along the dome of liver measuring 6 mm. This is too small to characterize. Small stone within the dependent  portion of the gallbladder.  Pancreas: Pancreas is negative  Spleen: Negative  Adrenals/Urinary Tract: Normal appearence of the adrenal glands. Status post right nephrectomy. Cyst noted arising from upper pole of left kidney. Mass arising from upper pole of left kidney measures 4.2 cm, image 28/series 2. This is not significantly changed in size from recent exam. There is mild left pelvocaliectasis and hydroureter. No obstructing stone identified. The urinary bladder is normal.  Stomach/Bowel: Small hiatal hernia noted. The stomach is within normal limits. The small bowel loops  have a normal course and caliber. No obstruction. Multiple distal colonic diverticula noted without acute inflammation.  Vascular/Lymphatic: Normal appearance of the aorta. No enlarged retroperitoneal or mesenteric adenopathy. No enlarged pelvic or inguinal lymph nodes.  Reproductive: Prostate gland and seminal vesicles are unremarkable.  Other: There is no ascites or focal fluid collections within the abdomen or pelvis.  Musculoskeletal: Degenerative disc disease noted at the L5-S1 level.  IMPRESSION: 1. Mild left pelvocaliectasis and hydroureter without evidence for obstructing stone. 2. Again noted is of mass arising from the upper pole the left kidney. This is compatible with a renal cell carcinoma. Not significantly changed from 01/18/2015. 3. Gallstone. 4. Previous right nephrectomy.   Electronically Signed   By: Kerby Moors M.D.   On: 02/11/2015 18:07   Mr 3d Recon At Scanner  02/12/2015   CLINICAL DATA:  Upper abdominal pain.  EXAM: MRI ABDOMEN WITHOUT AND WITH CONTRAST (INCLUDING MRCP)  TECHNIQUE: Multiplanar multisequence MR imaging of the abdomen was performed both before and after the administration of intravenous contrast. Heavily T2-weighted images of the biliary and pancreatic ducts were obtained, and three-dimensional MRCP images were rendered by post processing.  CONTRAST:  26mL MULTIHANCE GADOBENATE DIMEGLUMINE 529 MG/ML IV SOLN  COMPARISON:  02/11/2015  FINDINGS: Lower chest:  No pleural or pericardial effusion noted.  Hepatobiliary: Diffuse low T2 signal throughout the liver is identified which likely reflects susceptibility due to iron deposition within the liver hand or therapy with ferriheme. No focal liver abnormality noted. The gallbladder appears normal. There is no biliary dilatation. Normal caliber of the common bile duct. There is a small cyst noted within the left lobe measuring 6 mm.  Pancreas: Within the head of pancreas, in the pancreaticoduodenal groove, the previously noted  metastasis measures 0.9 cm, image 51 of series 5004. This is decreased in size from MRI dated 07/18/2012 when it measured 2.6 cm. No new areas of pancreatic metastasis identified. No focal pancreas abnormality identified. Small soft tissue attenuating nodule ventral to the tail of pancreas likely represents a splenule measuring 1.2 cm.  Spleen: The spleen measures 14.5 cm in length, image 13 of series 4. There is a diffuse abnormal low T2 signal throughout the spleen.  Adrenals/Urinary Tract: Normal appearance of the adrenal glands. Previous right nephrectomy. Cyst is noted within the upper pole of the left kidney measuring 4 cm, image 22/series 5. Also within the upper pole the left kidney is an enhancing solid mass measuring 5.3 cm and compatible with a renal cell carcinoma. Due therapy with Ferriheme, enhancement characteristics of the left kidney renal cell carcinoma and left renal vein are difficult to accurately assessed.  Stomach/Bowel: The stomach appears within normal limits. The small bowel loops appear mildly increased in caliber measuring up to 2.8 cm. The visualized portions of the colon are unremarkable.  Vascular/Lymphatic: On the precontrast images there is increased signal throughout the blood pool. This presumably reflects treatment with Ferriheme. No aneurysm identified. There is no  upper abdominal adenopathy.  Other: No free fluid or fluid collections identified within the upper abdomen.  Musculoskeletal: There is abnormal decreased T2 and T1 signal throughout the bone marrow. This may be related to patient's anemia and or treatment with Ferriheme.  IMPRESSION: 1. Exam detail is diminished due to altered signal secondary to Ferriheme therapy. 2. No acute findings noted and no explanation for upper abdominal pain. Specifically no evidence for gallstones or biliary a tract obstruction. 3. Solid mass within the upper pole of left kidney consistent with a renal cell carcinoma. 4. Splenomegaly. 5.  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   Electronically Signed   By: Kerby Moors M.D.   On: 02/12/2015 12:49   Mr Abd W/wo Cm/mrcp  02/12/2015   CLINICAL DATA:  Upper abdominal pain.  EXAM: MRI ABDOMEN WITHOUT AND WITH CONTRAST (INCLUDING MRCP)  TECHNIQUE: Multiplanar multisequence MR imaging of the abdomen was performed both before and after the administration of intravenous contrast. Heavily T2-weighted images of the biliary and pancreatic ducts were obtained, and three-dimensional MRCP images were rendered by post processing.  CONTRAST:  60mL MULTIHANCE GADOBENATE DIMEGLUMINE 529 MG/ML IV SOLN  COMPARISON:  02/11/2015  FINDINGS: Lower chest:  No pleural or pericardial effusion noted.  Hepatobiliary: Diffuse low T2 signal throughout the liver is identified which likely reflects susceptibility due to iron deposition within the liver hand or therapy with ferriheme. No focal liver abnormality noted. The gallbladder appears normal. There is no biliary dilatation. Normal caliber of the common bile duct. There is a small cyst noted within the left lobe measuring 6 mm.  Pancreas: Within the head of pancreas, in the pancreaticoduodenal groove, the previously noted metastasis measures 0.9 cm, image 51 of series 5004. This is decreased in size from MRI dated 07/18/2012 when it measured 2.6 cm. No new areas of pancreatic metastasis identified. No focal pancreas abnormality identified. Small soft tissue attenuating nodule ventral to the tail of pancreas likely represents a splenule measuring 1.2 cm.  Spleen: The spleen measures 14.5 cm in length, image 13 of series 4. There is a diffuse abnormal low T2 signal throughout the spleen.  Adrenals/Urinary Tract: Normal appearance of the adrenal glands. Previous right nephrectomy. Cyst is noted within the upper pole of the left kidney measuring 4 cm, image 22/series 5. Also within the upper pole the left kidney is an enhancing solid mass  measuring 5.3 cm and compatible with a renal cell carcinoma. Due therapy with Ferriheme, enhancement characteristics of the left kidney renal cell carcinoma and left renal vein are difficult to accurately assessed.  Stomach/Bowel: The stomach appears within normal limits. The small bowel loops appear mildly increased in caliber measuring up to 2.8 cm. The visualized portions of the colon are unremarkable.  Vascular/Lymphatic: On the precontrast images there is increased signal throughout the blood pool. This presumably reflects treatment with Ferriheme. No aneurysm identified. There is no upper abdominal adenopathy.  Other: No free fluid or fluid collections identified within the upper abdomen.  Musculoskeletal: There is abnormal decreased T2 and T1 signal throughout the bone marrow. This may be related to patient's anemia and or treatment with Ferriheme.  IMPRESSION: 1. Exam detail is diminished due to altered signal secondary to Ferriheme therapy. 2. No acute findings noted and no explanation for upper abdominal pain. Specifically no evidence for gallstones or biliary a tract obstruction. 3. Solid mass within the upper pole of left kidney consistent with a renal cell carcinoma. 4. Splenomegaly.  5. 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   Electronically Signed   By: Kerby Moors M.D.   On: 02/12/2015 12:49   US Abdomen Limited Ruq  02/12/2015   CLINICAL DATA:  Elevated liver function tests  EXAM: US ABDOMEN LIMITED - RIGHT UPPER QUADRANT  COMPARISON:  CT 02/11/2015  FINDINGS: Gallbladder:  No gallstones or wall thickening visualized. No sonographic Murphy sign noted.  Common bile duct:  Diameter: 5 mm  Liver:  No focal lesion identified. Upper limits of normal in parenchymal echogenicity.  IMPRESSION: Mildly increased hepatic echogenicity which is nonspecific and could indicate steatosis or other infiltrative process.   Electronically Signed   By: Conchita Paris M.D.    On: 02/12/2015 11:06    Scheduled Meds: . sodium chloride   Intravenous Once  . feeding supplement (ENSURE ENLIVE)  237 mL Oral BID BM  . pantoprazole (PROTONIX) IV  40 mg Intravenous Q12H  . saccharomyces boulardii  250 mg Oral Daily  . topiramate  25 mg Oral BID   Continuous Infusions: . 0.9 % NaCl with KCl 20 mEq / L Stopped (02/12/15 1800)   Assessment and plan:  Principal Problem:   Abdominal pain, right upper quadrant Active Problems:   Elevated LFTs   Anemia due to blood loss   Renal cell cancer   Barrett's esophagus   Malnutrition of moderate degree   Acute kidney injury   1. Right upper quadrant abdominal pain. On admission, the patient's CT scan revealed a gallstone, but no suspicious ascites or fluid collections. He was febrile with a temperature of temperature of 102.8. He was started on Zosyn empirically. Blood cultures were not ordered. There was a suspicion of acute or cholecystitis. General surgeon, [name] was consulted. He noted that the patient's liver enzymes and total bilirubin increased significantly. Therefore, he ordered an ultrasound of the abdomen followed by an MRCP to assess the hepatobiliary tree. The ultrasound revealed mild increased hepatic echogenicity-query steatosis. The MRCP revealed no suspicious findings that would explain the patient's right upper quadrant abdominal pain, but the exam was limited due to altered signal secondary to Ferriheme given following admission. We'll continue PPI empirically and morphine as needed for pain.  Fever. The patient had isolated fever of 102.8 in the ED. His urinalysis and chest x-ray were not indicative of infection. Apparently, blood cultures were not ordered in the ED. He was started on Zosyn empirically. He is now afebrile and there is no leukocytosis. We'll discontinued Zosyn for now in light of reported loose stools or diarrhea.  Loose stools. The patient reports a history of loose stools on IV  antibiotic. The patient is currently afebrile and his white blood cell count is within normal limits. There does not appear to be a pneumonia or urinary tract infection. We'll hold Zosyn for now. We'll order C. difficile PCR.   Elevated LFTs. The patient's AST was 168, ALT 55, and total bilirubin was 1.3 on admission. His transaminases have increased significantly, but have trended downward little with exception of the bilirubin which has increased. He takes Votrient for renal cell carcinoma. The side effects include hepatotoxicity. Per review of his chart, his oncologist, [name] noted that Votrient was discontinued briefly in 2014 because of elevated LFTs, but it was eventually restarted. It was discontinued per my conversation with pharmacy on 5/28. The patient was also given IV iron following admission on, not quite sure if this played a part. We'll order a  viral hepatitis panel for evaluation; the results are pending. We'll continue to monitor and consult GI if needed.  History of Barrett's esophagitis and history of diverticulosis The patient underwent an EGD by [name] on 01/19/15. The results revealed an abnormal esophagus consistent with Barrett's esophagus. He had a colonoscopy in 2004 secondary to rectal bleeding and was found to have diverticulosis-bleeding control therapy was given. The patient reports black tarry stools prior to being given IV iron following admission. His stool was guaiac positive. We continue PPI therapy.  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 The patient's hemoglobin was 7.8 on admission. 3 weeks ago was 8.0. One month ago it was 9.6. He reports black tarry stools 1 day, but no bright red blood per rectum and no bloody urine. We'll continue PPI every 12 hours.  He was transfused 2 units of packed red blood cells on 5/28 with improvement in his hemoglobin. Consider inpatient GI consult if his hemoglobin does  not improve with blood transfusion; otherwise, outpatient therapy would be appropriate.  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 The patient has a known history of renal cell carcinoma in the right kidney, status post radical nephrectomy 2010. He now has a left kidney mass which appears to be a new cancer, diagnosed in 2013. This was known to have metastasized to the pancreas. MRI of his abdomen during this admission reveals the same. He is followed by oncologist Dr. Nicola Girt. He was started on Vottrient in 2013. It was held 11/2012 for increase in LFTs, but were resumed in April 2014. It is currently being held secondary to an increase in his LFTs. -There was a suspicion of a perirectal abscess approximately 4 weeks ago, but the MRI during this admission does not appear to show this.  Acute kidney injury. The patient's creatinine was within normal limits approximately 4-5 weeks ago. It was 1.42 on admission. It has increased to 1.63, , but is now trending downward. We will continue IV fluid hydration and follow his renal function.  Hypokalemia. We'll continue to replete orally and in the IV fluids.     Time spent: 35 minutes.    [facility] Pager [phone]. If 7PM-7AM, please contact night-coverage at [email or web address], password [facility] 02/13/2015, 2:57 PM  LOS: 2 days

## 2015-02-14 DIAGNOSIS — R001 Bradycardia, unspecified: Secondary | ICD-10-CM | POA: Diagnosis present

## 2015-02-14 LAB — T4, FREE: FREE T4: 0.97 ng/dL (ref 0.61–1.12)

## 2015-02-14 LAB — HEPATITIS PANEL, ACUTE
HCV AB: NEGATIVE
Hep A IgM: NONREACTIVE
Hep B C IgM: NONREACTIVE
Hepatitis B Surface Ag: NEGATIVE

## 2015-02-14 LAB — CBC
HCT: 29.1 % — ABNORMAL LOW (ref 39.0–52.0)
Hemoglobin: 9.3 g/dL — ABNORMAL LOW (ref 13.0–17.0)
MCH: 30.3 pg (ref 26.0–34.0)
MCHC: 32 g/dL (ref 30.0–36.0)
MCV: 94.8 fL (ref 78.0–100.0)
Platelets: 182 10*3/uL (ref 150–400)
RBC: 3.07 MIL/uL — AB (ref 4.22–5.81)
RDW: 18.6 % — ABNORMAL HIGH (ref 11.5–15.5)
WBC: 3.2 10*3/uL — AB (ref 4.0–10.5)

## 2015-02-14 LAB — HEPATIC FUNCTION PANEL
ALBUMIN: 2.3 g/dL — AB (ref 3.5–5.0)
ALK PHOS: 258 U/L — AB (ref 38–126)
ALT: 90 U/L — ABNORMAL HIGH (ref 17–63)
AST: 85 U/L — ABNORMAL HIGH (ref 15–41)
BILIRUBIN TOTAL: 2.7 mg/dL — AB (ref 0.3–1.2)
Bilirubin, Direct: 1.7 mg/dL — ABNORMAL HIGH (ref 0.1–0.5)
Indirect Bilirubin: 1 mg/dL — ABNORMAL HIGH (ref 0.3–0.9)
Total Protein: 5.9 g/dL — ABNORMAL LOW (ref 6.5–8.1)

## 2015-02-14 LAB — BASIC METABOLIC PANEL
ANION GAP: 8 (ref 5–15)
BUN: 9 mg/dL (ref 6–20)
CO2: 24 mmol/L (ref 22–32)
Calcium: 8.7 mg/dL — ABNORMAL LOW (ref 8.9–10.3)
Chloride: 106 mmol/L (ref 101–111)
Creatinine, Ser: 1.38 mg/dL — ABNORMAL HIGH (ref 0.61–1.24)
GFR, EST NON AFRICAN AMERICAN: 53 mL/min — AB (ref >60)
Glucose, Bld: 95 mg/dL (ref 65–99)
POTASSIUM: 4.1 mmol/L (ref 3.5–5.1)
SODIUM: 138 mmol/L (ref 135–145)

## 2015-02-14 LAB — TSH: TSH: 2.198 u[IU]/mL (ref 0.350–4.500)

## 2015-02-14 LAB — CLOSTRIDIUM DIFFICILE BY PCR: Toxigenic C. Difficile by PCR: NEGATIVE

## 2015-02-14 MED ORDER — PANTOPRAZOLE SODIUM 40 MG PO TBEC
40.0000 mg | DELAYED_RELEASE_TABLET | Freq: Every day | ORAL | Status: DC
  Administered 2015-02-14 – 2015-02-15 (×2): 40 mg via ORAL
  Filled 2015-02-14 (×2): qty 1

## 2015-02-14 NOTE — Progress Notes (Signed)
Pt ambulated in hall around the circle without difficulty.  HR 67-30 and 100% RA.  At rest HR 42-47 after ambulating.  BP 165/69, HR 42.

## 2015-02-14 NOTE — Progress Notes (Signed)
[name]  ERICA OSUNA JSH:[id number] DOB: August 09, 1951 DOA: 02/11/2015 PCP: [name], MD    Code Status: Full code Family Communication: Discussed with wife Disposition Plan: Discharge when clinically appropriate   Consultants:  Gen. surgery, [name]  Procedures:  None  Antibiotics:  Zosyn 5/27>>5/29.  HPI/Subjective: Patient reports no complaints of abdominal pain, nausea, vomiting. His stools are still loose, but not really in sit or diarrhea. He believes it was from the IV antibiotic. He denies chest pain or palpitations.  Objective: Filed Vitals:   02/14/15 1400  BP: 150/71  Pulse: 50  Temp: 98.5 F (36.9 C)  Resp: 18   oxygen saturation 100%.  Intake/Output Summary (Last 24 hours) at 02/14/15 1658 Last data filed at 02/14/15 1500  Gross per 24 hour  Intake 537.67 ml  Output   1950 ml  Net -1412.33 ml   Filed Weights   02/11/15 1352 02/11/15 2228  Weight: 73.029 kg (161 lb) 75.6 kg (166 lb 10.7 oz)    Exam:   General:  Debilitated appearing, but alert 64 year old man in no acute distress. Mildly jaundiced.  Cardiovascular: S1, S2, with 1 to 2/6 systolic murmur and bradycardia.  Respiratory: Decreased breath sounds at bases, otherwise clear.  Abdomen: Positive bowel sounds, soft, mildly tender in the epigastrium; no masses palpated; no appreciable distention.  Musculoskeletal/extremities: No acute hot red joints. No pedal edema.  Neurologic: He is alert and oriented 3. His speech is clear.  Data Reviewed: Basic Metabolic Panel:  Recent Labs Lab 02/11/15 1435 02/12/15 0549 02/13/15 0548 02/14/15 0712  NA 138 139 136 138  K 3.2* 3.4* 3.6 4.1  CL 110 106 105 106  CO2 20* 23 23 24   GLUCOSE 106* 83 100* 95  BUN 19 17 11 9   CREATININE 1.42* 1.63* 1.51* 1.38*  CALCIUM 8.4* 8.3* 8.6* 8.7*   Liver Function Tests:  Recent Labs Lab 02/11/15 1435 02/12/15 0549 02/13/15 0548 02/14/15 0712  AST 168* 343*  135* 85*  ALT 55 156* 110* 90*  ALKPHOS 344* 350* 281* 258*  BILITOT 1.3* 3.0* 4.6* 2.7*  PROT 6.7 6.0* 5.7* 5.9*  ALBUMIN 3.0* 2.6* 2.4* 2.3*    Recent Labs Lab 02/11/15 1435  LIPASE 37   No results for input(s): AMMONIA in the last 168 hours. CBC:  Recent Labs Lab 02/11/15 1435 02/12/15 0549 02/12/15 1748 02/13/15 0548 02/14/15 0712  WBC 4.3 3.5* 4.7 4.6 3.2*  NEUTROABS 3.7  --   --   --   --   HGB 7.8* 7.6* 9.0* 9.0* 9.3*  HCT 24.0* 23.8* 27.3* 27.5* 29.1*  MCV 97.6 98.8 93.5 94.2 94.8  PLT 200 168 158 168 182   Cardiac Enzymes:  Recent Labs Lab 02/11/15 1435  TROPONINI <0.03   BNP (last 3 results)  Recent Labs  01/03/15 1043  BNP 57.0    ProBNP (last 3 results) No results for input(s): PROBNP in the last 8760 hours.  CBG: No results for input(s): GLUCAP in the last 168 hours.  No results found for this or any previous visit (from the past 240 hour(s)).   Studies: No results found.  Scheduled Meds: . feeding supplement (ENSURE ENLIVE)  237 mL Oral BID BM  . pantoprazole  40 mg Oral Daily  . potassium chloride  20 mEq Oral Daily  . saccharomyces boulardii  250 mg Oral Daily  . topiramate  25 mg Oral BID   Continuous Infusions: . 0.9 % NaCl with KCl 20 mEq / L  Stopped (02/14/15 1500)   Assessment and plan:  Principal Problem:   Abdominal pain, right upper quadrant Active Problems:   Elevated LFTs   Anemia due to blood loss   Renal cell cancer   Barrett's esophagus   Malnutrition of moderate degree   Acute kidney injury   1. Right upper quadrant abdominal pain. On admission, the patient's CT scan revealed a gallstone, but no suspicious ascites or fluid collections. He was febrile with a temperature of temperature of 102.8. He was started on Zosyn empirically. Blood cultures were not ordered. There was a suspicion of acute or cholecystitis. General surgeon, [name] was consulted. He noted that the patient's liver enzymes and total  bilirubin increased significantly. Therefore, he ordered an ultrasound of the abdomen followed by an MRCP to assess the hepatobiliary tree. The ultrasound revealed mild increased hepatic echogenicity-query steatosis. The MRCP revealed no suspicious findings that would explain the patient's right upper quadrant abdominal pain, but the exam was limited due to altered signal secondary to Ferriheme given following admission. We'll continue PPI empirically and morphine as needed for pain.  Fever. The patient had isolated fever of 102.8 in the ED. His urinalysis and chest x-ray were not indicative of infection. Apparently, blood cultures were not ordered in the ED. He was started on Zosyn empirically. He is now afebrile and there is no leukocytosis.  Zosyn was discontinued in light of reported loose stools or diarrhea.  Loose stools. The patient reports a history of loose stools on IV antibiotic. The patient is currently afebrile and his white blood cell count is within normal limits. There does not appear to be a pneumonia or urinary tract infection, so Zosyn was discontinued. Probiotic was added. We'll order C. difficile PCR; results pending.  Elevated LFTs. The patient's AST was 168, ALT 55, and total bilirubin was 1.3 on admission. His transaminases have increased significantly, but have trended downward little with exception of the bilirubin which has increased. He takes Votrient for renal cell carcinoma. The side effects include hepatotoxicity. Per review of his chart, his oncologist, [name] noted that Votrient was discontinued briefly in 2014 because of elevated LFTs, but it was eventually restarted. It was discontinued per my conversation with pharmacy on 5/28. The patient was also given IV iron following admission on, not quite sure if this played a part. Viral hepatitis panel was negative. His liver transaminases are trending downward. We'll continue to follow.  Bradycardia. The  patient was noted to have a heart rate in the 40s to 50s. Telemetry monitor was placed. His TSH was assessed and was within normal limits. With ambulation, his heart rate increased to 67-80 and decreased to 42-47 at rest. He was asymptomatic. We'll order an EKG for further evaluation. Will refer to outpatient cardiology upon discharge.  History of Barrett's esophagitis and history of diverticulosis The patient underwent an EGD by [name] on 01/19/15. The results revealed an abnormal esophagus consistent with Barrett's esophagus. He had a colonoscopy in 2004 secondary to rectal bleeding and was found to have diverticulosis-bleeding control therapy was given. The patient reports black tarry stools prior to being given IV iron following admission. His stool was guaiac positive. We continue PPI therapy.  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 The patient's hemoglobin was 7.8 on admission. 3 weeks ago was 8.0. One month ago it was 9.6. He reports black tarry stools 1 day, but no bright red blood per rectum and no bloody urine.  We'll continue PPI every 12 hours.  He was transfused 2 units of packed red blood cells on 5/28 with improvement in his hemoglobin. It has been stable 2 days.   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 The patient has a known history of renal cell carcinoma in the right kidney, status post radical nephrectomy 2010. He now has a left kidney mass which appears to be a new cancer, diagnosed in 2013. This was known to have metastasized to the pancreas. MRI of his abdomen during this admission reveals the same. He is followed by oncologist Dr. Nicola Girt. He was started on Vottrient in 2013. It was held 11/2012 for increase in LFTs, but were resumed in April 2014. It is currently being held secondary to an increase in his LFTs. -There was a suspicion of a perirectal abscess approximately 4 weeks ago, but the MRI during this  admission does not appear to show this.  Acute kidney injury. The patient's creatinine was within normal limits approximately 4-5 weeks ago. It was 1.42 on admission. It has increased to 1.63, , but is now trending downward status post gentle IV fluids.  Hypokalemia. His serum potassium has improved, status post potassium chloride supplementation.     Time spent: 35 minutes.    [facility] Pager [phone]. If 7PM-7AM, please contact night-coverage at [email or web address], password [facility] 02/14/2015, 4:58 PM  LOS: 3 days

## 2015-02-15 ENCOUNTER — Other Ambulatory Visit: Payer: Self-pay | Admitting: Oncology

## 2015-02-15 DIAGNOSIS — C649 Malignant neoplasm of unspecified kidney, except renal pelvis: Secondary | ICD-10-CM

## 2015-02-15 DIAGNOSIS — K8020 Calculus of gallbladder without cholecystitis without obstruction: Secondary | ICD-10-CM | POA: Insufficient documentation

## 2015-02-15 LAB — CBC
HCT: 34 % — ABNORMAL LOW (ref 39.0–52.0)
Hemoglobin: 10.7 g/dL — ABNORMAL LOW (ref 13.0–17.0)
MCH: 30.2 pg (ref 26.0–34.0)
MCHC: 31.5 g/dL (ref 30.0–36.0)
MCV: 96 fL (ref 78.0–100.0)
Platelets: 232 10*3/uL (ref 150–400)
RBC: 3.54 MIL/uL — ABNORMAL LOW (ref 4.22–5.81)
RDW: 18.6 % — ABNORMAL HIGH (ref 11.5–15.5)
WBC: 4.3 10*3/uL (ref 4.0–10.5)

## 2015-02-15 LAB — BASIC METABOLIC PANEL
ANION GAP: 8 (ref 5–15)
BUN: 11 mg/dL (ref 6–20)
CALCIUM: 9.2 mg/dL (ref 8.9–10.3)
CHLORIDE: 104 mmol/L (ref 101–111)
CO2: 26 mmol/L (ref 22–32)
Creatinine, Ser: 1.28 mg/dL — ABNORMAL HIGH (ref 0.61–1.24)
GFR calc Af Amer: >60 mL/min (ref >60)
GFR calc non Af Amer: 58 mL/min — ABNORMAL LOW (ref >60)
GLUCOSE: 97 mg/dL (ref 65–99)
Potassium: 4.2 mmol/L (ref 3.5–5.1)
SODIUM: 138 mmol/L (ref 135–145)

## 2015-02-15 LAB — HEPATIC FUNCTION PANEL
ALBUMIN: 2.6 g/dL — AB (ref 3.5–5.0)
ALK PHOS: 332 U/L — AB (ref 38–126)
ALT: 83 U/L — ABNORMAL HIGH (ref 17–63)
AST: 67 U/L — ABNORMAL HIGH (ref 15–41)
BILIRUBIN TOTAL: 1.9 mg/dL — AB (ref 0.3–1.2)
Bilirubin, Direct: 1 mg/dL — ABNORMAL HIGH (ref 0.1–0.5)
Indirect Bilirubin: 0.9 mg/dL (ref 0.3–0.9)
Total Protein: 6.5 g/dL (ref 6.5–8.1)

## 2015-02-15 MED ORDER — ONDANSETRON 4 MG PO TBDP: 4.0000 mg | ORAL_TABLET | Freq: Three times a day (TID) | ORAL | Status: DC | PRN

## 2015-02-15 MED ORDER — OXYCODONE-ACETAMINOPHEN 5-325 MG PO TABS: 1.0000 | ORAL_TABLET | ORAL | Status: DC | PRN

## 2015-02-15 NOTE — Discharge Summary (Signed)
Physician Discharge Summary  Jeffrey Trujillo FIE:[id number] DOB: 10-09-1950 DOA: 02/11/2015  PCP: [name], MD  Admit date: 02/11/2015 Discharge date: 02/15/2015  Time spent: Greater than 30 minutes  Recommendations for Outpatient Follow-up:  1. Recommend follow-up of the patient's liver transaminases-[name] office will call the patient to schedule. 2.   Discharge Diagnoses:  1. Right upper quadrant abdominal pain, possibly secondary to a passed gallstone. -No evidence of acute findings or gallstones per abdominal MRI/MRCP. 2. Elevated liver transaminases/LFTs, thought to be secondary to Votrient. 3. Isolated fever, but no obvious etiology. 4. Anemia secondary to acute and chronic blood loss anemia and anemia of chronic disease. -Status post 2 units packed red blood cell transfusion and IV iron 1. 5. History of Barrett's esophagitis and diverticulosis. 6. Acute kidney injury secondary to prerenal azotemia. 7. Malnutrition of moderate degree. 8. Sinus bradycardia. 9. Mild hypokalemia. 10. 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   Discharge Condition: Improved.  Diet recommendation: Heart healthy.  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 In the ED, CT scan of his abdomen and pelvis revealed mild left hydroureter without evidence for obstructing stone, mass arising from the upper pole of the left kidney, gallstone, previous right nephrectomy. His lipase was within normal limits at 37. Alkaline phosphatase elevated at 344, AST elevated at 168, normal ALT of 55, and slightly elevated ileal loop and a  1.3. His hemoglobin was 7.8. His urinalysis revealed no WBCs. He was admitted for further evaluation and management.  Hospital Course:   1. Right upper quadrant abdominal pain. On admission, the patient's CT scan revealed a gallstone, but no suspicious ascites or fluid collections. He was febrile with a temperature of temperature of 102.8. He was started on Zosyn empirically. He was continued on twice a day dosing of his PPI. Morphine was given as needed for pain. Blood cultures were not ordered. There was a suspicion of acute or chronic cholecystitis. General surgeon, [name] was consulted. He noted that the patient's liver enzymes and total bilirubin had increased significantly the day following admission. Therefore, he ordered an ultrasound of the abdomen followed by an MRCP to assess the hepatobiliary tree. The ultrasound revealed mild increased hepatic echogenicity-query steatosis, but no gallstones seen. The MRCP revealed no suspicious findings that would explain the patient's right upper quadrant abdominal pain, specifically, there was no evidence of a gallstone but the exam was limited due to altered signal secondary to Ferriheme given following admission. His pain resolved. It was presumed that he may have passed a gallstone. Fever. The patient had isolated fever of 102.8 in the ED. His urinalysis and chest x-ray were not indicative of infection. Apparently, blood cultures were not ordered in the ED. He was started on Zosyn empirically. He became afebrile and remained afebrile. His white blood cell count was never elevated. Zosyn was discontinued in light of reported loose stools or diarrhea. Loose stools. The patient reported a history of loose stools when he gets IV antibiotics. Zosyn was discontinued when there did not appear to be an obvious reason for his fever in the ED. C. difficile PCR was ordered and  was negative. His loose stools subside Elevated LFTs. The patient's AST was  168, ALT 55, and total bilirubin was 1.3 on admission. His transaminases increased significantly the following day.He takes Votrient for renal cell carcinoma. The side effects include hepatotoxicity. Per review of his chart, his oncologist, [name] noted that Votrient was discontinued briefly in 2014 because of elevated LFTs, but it was eventually restarted. It was discontinued following my conversation with pharmacy on 02/12/15. His viral hepatitis panel was negative. His LFTs did trend downward. His AST was 67, ALT 83, and total bilirubin was 1.9. I called [name] to inform him that the patient's liver enzymes were elevated and that we were holding Votrient. He agreed. He plans to call the patient for follow-up of his LFTs. In the meantime, he did not want the patient to restart Votrient. This was explained to the patient who voiced understanding. Bradycardia. The patient was noted to have a heart rate in the 40s to 50s. Telemetry monitor was placed. His TSH was assessed and was within normal limits. With ambulation, his heart rate increased to 67-80 and decreased to 42-47 at rest. He was asymptomatic. His EKG reveals sinus bradycardia. He was referred to outpatient cardiology for further evaluation. History of Barrett's esophagitis and history of diverticulosis The patient underwent an EGD by [name] on 01/19/15. The results revealed an abnormal esophagus consistent with Barrett's esophagus. He had a colonoscopy in 2004 secondary to rectal bleeding and was found to have diverticulosis-bleeding control therapy was given. The patient reported black tarry stools prior to being given IV iron. His stool was brownish black and was guaiac positive. There was no evidence of bright red blood per rectum and he denied hematemesis. He was continued on PPI therapy. Anemia secondary to possible mild acute on chronic blood loss/chronic anemia and anemia of chronic disease. The patient's hemoglobin was 7.8  on admission. 3 weeks ago it was 8.0. One month ago it was 9.6. He reports black tarry stools 1 day, but no bright red blood per rectum and no bloody urine. He was continued on every 12 hours Protonix. He was transfused 2 units of packed red blood cells on 5/28 with improvement in his hemoglobin. It has been stable 2 days. 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 The patient has a known history of renal cell carcinoma in the right kidney, status post radical nephrectomy 2010. He now has a left kidney mass which appears to be a new cancer, diagnosed in 2013. This was known to have metastasized to the pancreas. MRI of his abdomen during this admission revealed the same. He is followed by oncologist [name]. He was started on Vottrient in 2013. It was held 11/2012 for increase in LFTs, but was resumed in April 2014. It is now being held again secondary to elevated LFTs. He will follow-up with oncology per their schedule and recommendation. Acute kidney injury secondary to prerenal azotemia. The patient's creatinine was within normal limits approximately 4-5 weeks ago. It was 1.42 on admission. It  increased to 1.63. Following IV fluid hydration, his creatinine improved 1.28 at the time of discharge.    Procedures:  None  Consultations:  Gen. surgery, [name]  Discharge Exam: Filed Vitals:   02/15/15 0423  BP: 139/78  Pulse: 57  Temp: 98.5 F (36.9 C)  Resp: 18    General: Debilitated appearing, but alert 64 year old man in no acute distress. Mildly jaundiced.  Cardiovascular: S1, S2, with 1 to 2/6 systolic murmur  and bradycardia.  Respiratory: Decreased breath sounds at bases, otherwise clear.  Abdomen: Positive bowel sounds, soft, nontender no masses palpated; no appreciable distention.  Musculoskeletal/extremities: No acute hot red joints. No pedal edema.  Neurologic: He is alert and oriented 3. His speech is clear.   Discharge  Instructions   Discharge Instructions    Diet - low sodium heart healthy    Complete by:  As directed      Discharge instructions    Complete by:  As directed   Do not take Votrient until you discuss it with [name].     Increase activity slowly    Complete by:  As directed      Other Restrictions    Complete by:  As directed   Do not return to work until you are cleared by [name].          Current Discharge Medication List    START taking these medications   Details  ondansetron (ZOFRAN ODT) 4 MG disintegrating tablet Take 1 tablet (4 mg total) by mouth every 8 (eight) hours as needed for nausea or vomiting. Qty: 20 tablet, Refills: 0    oxyCODONE-acetaminophen (PERCOCET/ROXICET) 5-325 MG per tablet Take 1 tablet by mouth every 4 (four) hours as needed. Qty: 15 tablet, Refills: 0      CONTINUE these medications which have NOT CHANGED   Details  pantoprazole (PROTONIX) 40 MG tablet Take 1 tablet (40 mg total) by mouth 2 (two) times daily before a meal. Qty: 60 tablet, Refills: 1   Associated Diagnoses: Renal cell cancer, unspecified laterality    topiramate (TOPAMAX) 25 MG tablet Take 25 mg by mouth 2 (two) times daily.   Associated Diagnoses: Renal cell cancer, unspecified laterality      STOP taking these medications     pazopanib (VOTRIENT) 200 MG tablet        Allergies  Allergen Reactions  . Nsaids Other (See Comments)    Patient only has one kidney.   Marland Kitchen Amoxicillin     n/v  . Demerol [Meperidine] Rash  . Meperidine Hcl Hives and Rash   Follow-up Information    Follow up with Dorris Carnes, MD On 03/06/2017.   Specialty:  Cardiology   Why:  At 9:30 AM for evaluation of your low heart rate.   Contact information:   [address] [place] [address] [phone]       Follow up with [facility], [place].   Specialty:  Oncology   Why:  [name] office will call you with the follow up appt for labwork.   Contact information:    [address] [place] [address] [phone]        The results of significant diagnostics from this hospitalization (including imaging, microbiology, ancillary and laboratory) are listed below for reference.    Significant Diagnostic Studies: Ct Abdomen Pelvis Wo Contrast  02/11/2015   CLINICAL DATA:  Sudden onset of generalized abdominal pain  EXAM: CT ABDOMEN AND PELVIS WITHOUT CONTRAST  TECHNIQUE: Multidetector CT imaging of the abdomen and pelvis was performed following the standard protocol without IV contrast.  COMPARISON:  01/18/2015  FINDINGS: Lower chest: There is no pleural effusion identified. The lung bases are clear.  Hepatobiliary: There is hepatic steatosis noted. There is a small low density structure along the dome of liver measuring 6 mm. This is too small to characterize. Small stone within the dependent portion of the gallbladder.  Pancreas: Pancreas is negative  Spleen: Negative  Adrenals/Urinary Tract: Normal appearence of the adrenal glands. Status post right nephrectomy. Cyst noted arising from upper pole of left kidney. Mass arising from upper pole of left kidney measures 4.2 cm, image 28/series 2. This is not significantly changed in size from recent exam. There is mild left pelvocaliectasis and hydroureter. No obstructing stone identified. The urinary bladder is normal.  Stomach/Bowel: Small hiatal hernia noted. The stomach is within normal limits. The small bowel loops have a normal course and caliber. No obstruction. Multiple distal colonic diverticula noted without acute inflammation.  Vascular/Lymphatic: Normal appearance of the aorta. No enlarged retroperitoneal or mesenteric adenopathy. No enlarged pelvic or inguinal lymph nodes.  Reproductive: Prostate gland and seminal vesicles are unremarkable.  Other: There is no ascites or focal fluid collections within the abdomen or pelvis.  Musculoskeletal: Degenerative disc disease noted at the L5-S1 level.  IMPRESSION: 1.  Mild left pelvocaliectasis and hydroureter without evidence for obstructing stone. 2. Again noted is of mass arising from the upper pole the left kidney. This is compatible with a renal cell carcinoma. Not significantly changed from 01/18/2015. 3. Gallstone. 4. Previous right nephrectomy.   Electronically Signed   By: Kerby Moors M.D.   On: 02/11/2015 18:07   Ct Chest W Contrast  01/18/2015   CLINICAL DATA:  Right renal cancer. Diagnosed in 2010 with recurrence in 2013. Status post right nephrectomy.  EXAM: CT CHEST, ABDOMEN, AND PELVIS WITH CONTRAST  TECHNIQUE: Multidetector CT imaging of the chest, abdomen and pelvis was performed following the standard protocol during bolus administration of intravenous contrast.  CONTRAST:  170mL OMNIPAQUE IOHEXOL 300 MG/ML  SOLN  COMPARISON:  07/28/2014  FINDINGS: CT CHEST FINDINGS  Chest wall: No chest wall mass, supraclavicular or axillary lymphadenopathy. The thyroid gland is grossly normal. The bony thorax is intact. No destructive bone lesions or spinal canal compromise.  Mediastinum: The heart is borderline enlarged but stable. No pericardial effusion. Small scattered mediastinal and hilar lymph nodes but no mass or adenopathy. The aorta is stable in caliber. No dissection. Stable mild fusiform dilatation of the ascending aorta with maximum measurement of 3.7 cm. The esophagus is grossly normal.  Lungs/ pleura: No worrisome pulmonary lesions to suggest pulmonary metastatic disease. No acute pulmonary findings. Stable scarring changes in the left lung with evidence of remote trauma. No pleural effusion.  CT ABDOMEN AND PELVIS FINDINGS  Hepatobiliary: Stable low-attenuation medial segment left hepatic lobe liver lesion most consistent with a benign cyst. No worrisome hepatic lesions or intrahepatic biliary dilatation. The gallbladder is mildly contracted. No common bile duct dilatation.  Pancreas:  Normal.  Spleen:  Normal.  Adrenals/Urinary Tract: Enlarging solid  enhancing left renal mass. It measures 4.6 x 4.5 cm and previously measured 3.3 x 3.0 cm. No renal vein involvement. No adenopathy. The right kidney is surgically absent. Both adrenal glands appear normal. Stable upper pole left renal cyst.  Stomach/Bowel: The stomach, duodenum, small bowel and colon are unremarkable. No inflammatory changes, mass lesions or obstructive findings. Stable colonic diverticulosis without findings for acute diverticulitis. 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 This was present on the prior study but may be more extensive now. MRI pelvis without and with contrast may be helpful for further evaluation.  Vascular/Lymphatic: No mesenteric or retroperitoneal mass or adenopathy. The aorta and branch vessels are patent. The major venous structures are patent.  Other: The bladder, prostate gland and seminal vesicles are  grossly normal. No pelvic mass or pelvic adenopathy. No inguinal adenopathy.  Musculoskeletal: No significant bony findings.  IMPRESSION: 1. Enlarging left renal mass as described above. 2. Status post right nephrectomy. 3. No CT findings to suggest metastatic disease involving the chest, abdomen or pelvis. 4. 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 MRI pelvis without and with contrast may be helpful for further evaluation.   Electronically Signed   By: Marijo Sanes M.D.   On: 01/18/2015 15:55   Ct Abdomen Pelvis W Contrast  01/18/2015   CLINICAL DATA:  Right renal cancer. Diagnosed in 2010 with recurrence in 2013. Status post right nephrectomy.  EXAM: CT CHEST, ABDOMEN, AND PELVIS WITH CONTRAST  TECHNIQUE: Multidetector CT imaging of the chest, abdomen and pelvis was performed following the standard protocol during bolus administration of intravenous contrast.  CONTRAST:  178mL OMNIPAQUE IOHEXOL 300 MG/ML  SOLN  COMPARISON:  07/28/2014   FINDINGS: CT CHEST FINDINGS  Chest wall: No chest wall mass, supraclavicular or axillary lymphadenopathy. The thyroid gland is grossly normal. The bony thorax is intact. No destructive bone lesions or spinal canal compromise.  Mediastinum: The heart is borderline enlarged but stable. No pericardial effusion. Small scattered mediastinal and hilar lymph nodes but no mass or adenopathy. The aorta is stable in caliber. No dissection. Stable mild fusiform dilatation of the ascending aorta with maximum measurement of 3.7 cm. The esophagus is grossly normal.  Lungs/ pleura: No worrisome pulmonary lesions to suggest pulmonary metastatic disease. No acute pulmonary findings. Stable scarring changes in the left lung with evidence of remote trauma. No pleural effusion.  CT ABDOMEN AND PELVIS FINDINGS  Hepatobiliary: Stable low-attenuation medial segment left hepatic lobe liver lesion most consistent with a benign cyst. No worrisome hepatic lesions or intrahepatic biliary dilatation. The gallbladder is mildly contracted. No common bile duct dilatation.  Pancreas:  Normal.  Spleen:  Normal.  Adrenals/Urinary Tract: Enlarging solid enhancing left renal mass. It measures 4.6 x 4.5 cm and previously measured 3.3 x 3.0 cm. No renal vein involvement. No adenopathy. The right kidney is surgically absent. Both adrenal glands appear normal. Stable upper pole left renal cyst.  Stomach/Bowel: The stomach, duodenum, small bowel and colon are unremarkable. No inflammatory changes, mass lesions or obstructive findings. Stable colonic diverticulosis without findings for acute diverticulitis. 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 This was present on the prior study but may be more extensive now. MRI pelvis without and with contrast may be helpful for further evaluation.  Vascular/Lymphatic: No mesenteric or retroperitoneal mass or adenopathy. The  aorta and branch vessels are patent. The major venous structures are patent.  Other: The bladder, prostate gland and seminal vesicles are grossly normal. No pelvic mass or pelvic adenopathy. No inguinal adenopathy.  Musculoskeletal: No significant bony findings.  IMPRESSION: 1. Enlarging left renal mass as described above. 2. Status post right nephrectomy. 3. No CT findings to suggest metastatic disease involving the chest, abdomen or pelvis. 4. 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 MRI pelvis without and with contrast may be helpful for further evaluation.   Electronically Signed   By: Marijo Sanes M.D.   On: 01/18/2015 15:55   Mr 3d Recon At Scanner  02/12/2015   CLINICAL DATA:  Upper abdominal pain.  EXAM: MRI ABDOMEN WITHOUT AND WITH CONTRAST (INCLUDING MRCP)  TECHNIQUE: Multiplanar multisequence MR imaging of the abdomen was performed both  before and after the administration of intravenous contrast. Heavily T2-weighted images of the biliary and pancreatic ducts were obtained, and three-dimensional MRCP images were rendered by post processing.  CONTRAST:  26mL MULTIHANCE GADOBENATE DIMEGLUMINE 529 MG/ML IV SOLN  COMPARISON:  02/11/2015  FINDINGS: Lower chest:  No pleural or pericardial effusion noted.  Hepatobiliary: Diffuse low T2 signal throughout the liver is identified which likely reflects susceptibility due to iron deposition within the liver hand or therapy with ferriheme. No focal liver abnormality noted. The gallbladder appears normal. There is no biliary dilatation. Normal caliber of the common bile duct. There is a small cyst noted within the left lobe measuring 6 mm.  Pancreas: Within the head of pancreas, in the pancreaticoduodenal groove, the previously noted metastasis measures 0.9 cm, image 51 of series 5004. This is decreased in size from MRI dated 07/18/2012 when it measured 2.6 cm. No new areas of pancreatic metastasis identified. No focal  pancreas abnormality identified. Small soft tissue attenuating nodule ventral to the tail of pancreas likely represents a splenule measuring 1.2 cm.  Spleen: The spleen measures 14.5 cm in length, image 13 of series 4. There is a diffuse abnormal low T2 signal throughout the spleen.  Adrenals/Urinary Tract: Normal appearance of the adrenal glands. Previous right nephrectomy. Cyst is noted within the upper pole of the left kidney measuring 4 cm, image 22/series 5. Also within the upper pole the left kidney is an enhancing solid mass measuring 5.3 cm and compatible with a renal cell carcinoma. Due therapy with Ferriheme, enhancement characteristics of the left kidney renal cell carcinoma and left renal vein are difficult to accurately assessed.  Stomach/Bowel: The stomach appears within normal limits. The small bowel loops appear mildly increased in caliber measuring up to 2.8 cm. The visualized portions of the colon are unremarkable.  Vascular/Lymphatic: On the precontrast images there is increased signal throughout the blood pool. This presumably reflects treatment with Ferriheme. No aneurysm identified. There is no upper abdominal adenopathy.  Other: No free fluid or fluid collections identified within the upper abdomen.  Musculoskeletal: There is abnormal decreased T2 and T1 signal throughout the bone marrow. This may be related to patient's anemia and or treatment with Ferriheme.  IMPRESSION: 1. Exam detail is diminished due to altered signal secondary to Ferriheme therapy. 2. No acute findings noted and no explanation for upper abdominal pain. Specifically no evidence for gallstones or biliary a tract obstruction. 3. Solid mass within the upper pole of left kidney consistent with a renal cell carcinoma. 4. Splenomegaly. 5. 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   Electronically Signed   By: Kerby Moors M.D.   On: 02/12/2015 12:49   Mr Abd W/wo Cm/mrcp  02/12/2015    CLINICAL DATA:  Upper abdominal pain.  EXAM: MRI ABDOMEN WITHOUT AND WITH CONTRAST (INCLUDING MRCP)  TECHNIQUE: Multiplanar multisequence MR imaging of the abdomen was performed both before and after the administration of intravenous contrast. Heavily T2-weighted images of the biliary and pancreatic ducts were obtained, and three-dimensional MRCP images were rendered by post processing.  CONTRAST:  87mL MULTIHANCE GADOBENATE DIMEGLUMINE 529 MG/ML IV SOLN  COMPARISON:  02/11/2015  FINDINGS: Lower chest:  No pleural or pericardial effusion noted.  Hepatobiliary: Diffuse low T2 signal throughout the liver is identified which likely reflects susceptibility due to iron deposition within the liver hand or therapy with ferriheme. No focal liver abnormality noted. The gallbladder appears normal. There is no biliary dilatation. Normal caliber  of the common bile duct. There is a small cyst noted within the left lobe measuring 6 mm.  Pancreas: Within the head of pancreas, in the pancreaticoduodenal groove, the previously noted metastasis measures 0.9 cm, image 51 of series 5004. This is decreased in size from MRI dated 07/18/2012 when it measured 2.6 cm. No new areas of pancreatic metastasis identified. No focal pancreas abnormality identified. Small soft tissue attenuating nodule ventral to the tail of pancreas likely represents a splenule measuring 1.2 cm.  Spleen: The spleen measures 14.5 cm in length, image 13 of series 4. There is a diffuse abnormal low T2 signal throughout the spleen.  Adrenals/Urinary Tract: Normal appearance of the adrenal glands. Previous right nephrectomy. Cyst is noted within the upper pole of the left kidney measuring 4 cm, image 22/series 5. Also within the upper pole the left kidney is an enhancing solid mass measuring 5.3 cm and compatible with a renal cell carcinoma. Due therapy with Ferriheme, enhancement characteristics of the left kidney renal cell carcinoma and left renal vein are difficult  to accurately assessed.  Stomach/Bowel: The stomach appears within normal limits. The small bowel loops appear mildly increased in caliber measuring up to 2.8 cm. The visualized portions of the colon are unremarkable.  Vascular/Lymphatic: On the precontrast images there is increased signal throughout the blood pool. This presumably reflects treatment with Ferriheme. No aneurysm identified. There is no upper abdominal adenopathy.  Other: No free fluid or fluid collections identified within the upper abdomen.  Musculoskeletal: There is abnormal decreased T2 and T1 signal throughout the bone marrow. This may be related to patient's anemia and or treatment with Ferriheme.  IMPRESSION: 1. Exam detail is diminished due to altered signal secondary to Ferriheme therapy. 2. No acute findings noted and no explanation for upper abdominal pain. Specifically no evidence for gallstones or biliary a tract obstruction. 3. Solid mass within the upper pole of left kidney consistent with a renal cell carcinoma. 4. Splenomegaly. 5. 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   Electronically Signed   By: Kerby Moors M.D.   On: 02/12/2015 12:49   US Abdomen Limited Ruq  02/12/2015   CLINICAL DATA:  Elevated liver function tests  EXAM: US ABDOMEN LIMITED - RIGHT UPPER QUADRANT  COMPARISON:  CT 02/11/2015  FINDINGS: Gallbladder:  No gallstones or wall thickening visualized. No sonographic Murphy sign noted.  Common bile duct:  Diameter: 5 mm  Liver:  No focal lesion identified. Upper limits of normal in parenchymal echogenicity.  IMPRESSION: Mildly increased hepatic echogenicity which is nonspecific and could indicate steatosis or other infiltrative process.   Electronically Signed   By: Conchita Paris M.D.   On: 02/12/2015 11:06    Microbiology: Recent Results (from the past 240 hour(s))  Clostridium Difficile by PCR     Status: None   Collection Time: 02/14/15  3:26 PM  Result Value Ref  Range Status   C difficile by pcr NEGATIVE NEGATIVE Final     Labs: Basic Metabolic Panel:  Recent Labs Lab 02/11/15 1435 02/12/15 0549 02/13/15 0548 02/14/15 0712 02/15/15 0624  NA 138 139 136 138 138  K 3.2* 3.4* 3.6 4.1 4.2  CL 110 106 105 106 104  CO2 20* 23 23 24 26   GLUCOSE 106* 83 100* 95 97  BUN 19 17 11 9 11   CREATININE 1.42* 1.63* 1.51* 1.38* 1.28*  CALCIUM 8.4* 8.3* 8.6* 8.7* 9.2   Liver Function Tests:  Recent Labs  Lab 02/11/15 1435 02/12/15 0549 02/13/15 0548 02/14/15 0712 02/15/15 0624  AST 168* 343* 135* 85* 67*  ALT 55 156* 110* 90* 83*  ALKPHOS 344* 350* 281* 258* 332*  BILITOT 1.3* 3.0* 4.6* 2.7* 1.9*  PROT 6.7 6.0* 5.7* 5.9* 6.5  ALBUMIN 3.0* 2.6* 2.4* 2.3* 2.6*    Recent Labs Lab 02/11/15 1435  LIPASE 37   No results for input(s): AMMONIA in the last 168 hours. CBC:  Recent Labs Lab 02/11/15 1435 02/12/15 0549 02/12/15 1748 02/13/15 0548 02/14/15 0712 02/15/15 0624  WBC 4.3 3.5* 4.7 4.6 3.2* 4.3  NEUTROABS 3.7  --   --   --   --   --   HGB 7.8* 7.6* 9.0* 9.0* 9.3* 10.7*  HCT 24.0* 23.8* 27.3* 27.5* 29.1* 34.0*  MCV 97.6 98.8 93.5 94.2 94.8 96.0  PLT 200 168 158 168 182 232   Cardiac Enzymes:  Recent Labs Lab 02/11/15 1435  TROPONINI <0.03   BNP: BNP (last 3 results)  Recent Labs  01/03/15 1043  BNP 57.0    ProBNP (last 3 results) No results for input(s): PROBNP in the last 8760 hours.  CBG: No results for input(s): GLUCAP in the last 168 hours.     Signed:  Jereline Ticer  [facility] 02/15/2015, 10:11 AM

## 2015-02-15 NOTE — Progress Notes (Signed)
Patient discharged home.  IV removed - WNL.  Instructed on new prescriptions. Follow up in place with cardio and oncologist office to call patient to schedule.  Instructed to no longer take votrient unless instructed to by oncologist and to return to work when cleared by him as well.  Note for work by MD given to patient.  No questions at this time.  Stable to DC home.

## 2015-02-15 NOTE — Care Management Note (Signed)
Case Management Note  Patient Details  Name: TEREZ FREIMARK MRN: [id number] Date of Birth: 1950-11-23  Expected Discharge Date:  02/15/15               Expected Discharge Plan:  Home/Self Care  In-House Referral:  NA  Discharge planning Services  CM Consult  Post Acute Care Choice:  NA Choice offered to:  NA  DME Arranged:    DME Agency:     HH Arranged:    [place] Agency:     Status of Service:  Completed, signed off  Medicare Important Message Given:    Date Medicare IM Given:    Medicare IM give by:    Date Additional Medicare IM Given:    Additional Medicare Important Message give by:     If discussed at [place] of Stay Meetings, dates discussed:    Additional Comments: Pt is from home, lives with wife and is independent at baseline. Pt plans to discharge home today with self care. No CM needs.   Sherald Barge, RN 02/15/2015, 2:09 PM

## 2015-02-17 ENCOUNTER — Telehealth: Payer: Self-pay | Admitting: Oncology

## 2015-02-17 ENCOUNTER — Telehealth: Payer: Self-pay | Admitting: *Deleted

## 2015-02-17 ENCOUNTER — Other Ambulatory Visit: Payer: Self-pay | Admitting: Oncology

## 2015-02-17 NOTE — Telephone Encounter (Signed)
He will needs labs on 6/8. POF sent to schedulers. Please inform the patient about labs on 6/8 and that he will be called with these results.

## 2015-02-17 NOTE — Telephone Encounter (Signed)
TC to pt's wife and confirmed appt times with her for 02/23/15. She voiced understanding.

## 2015-02-17 NOTE — Telephone Encounter (Signed)
S/w pt's wife confirming labs per 06/02 POF also pt request office visit per hospital f/u MD ..... KJ

## 2015-02-17 NOTE — Telephone Encounter (Signed)
TC from pt's wife. Pt was discharged from hospital on Tuesday. Wife states he needs an earlier appt than 03/04/15 to evaluate live enzymes, oral chemo etc.

## 2015-02-23 ENCOUNTER — Other Ambulatory Visit: Payer: 59

## 2015-02-23 ENCOUNTER — Other Ambulatory Visit (HOSPITAL_BASED_OUTPATIENT_CLINIC_OR_DEPARTMENT_OTHER): Payer: 59

## 2015-02-23 ENCOUNTER — Ambulatory Visit (HOSPITAL_BASED_OUTPATIENT_CLINIC_OR_DEPARTMENT_OTHER): Payer: 59 | Admitting: Oncology

## 2015-02-23 ENCOUNTER — Encounter: Payer: Self-pay | Admitting: Oncology

## 2015-02-23 VITALS — BP 106/63 | HR 66 | Temp 98.5°F | Resp 18 | Ht 71.0 in | Wt 164.7 lb

## 2015-02-23 DIAGNOSIS — C641 Malignant neoplasm of right kidney, except renal pelvis: Secondary | ICD-10-CM

## 2015-02-23 DIAGNOSIS — D649 Anemia, unspecified: Secondary | ICD-10-CM

## 2015-02-23 DIAGNOSIS — C7989 Secondary malignant neoplasm of other specified sites: Secondary | ICD-10-CM

## 2015-02-23 DIAGNOSIS — C642 Malignant neoplasm of left kidney, except renal pelvis: Secondary | ICD-10-CM

## 2015-02-23 DIAGNOSIS — C649 Malignant neoplasm of unspecified kidney, except renal pelvis: Secondary | ICD-10-CM

## 2015-02-23 LAB — COMPREHENSIVE METABOLIC PANEL (CC13)
ALT: 36 U/L (ref 0–55)
ANION GAP: 6 meq/L (ref 3–11)
AST: 41 U/L — AB (ref 5–34)
Albumin: 2.8 g/dL — ABNORMAL LOW (ref 3.5–5.0)
Alkaline Phosphatase: 294 U/L — ABNORMAL HIGH (ref 40–150)
BUN: 24 mg/dL (ref 7.0–26.0)
CHLORIDE: 111 meq/L — AB (ref 98–109)
CO2: 24 mEq/L (ref 22–29)
Calcium: 9.5 mg/dL (ref 8.4–10.4)
Creatinine: 1.6 mg/dL — ABNORMAL HIGH (ref 0.7–1.3)
EGFR: 46 mL/min/{1.73_m2} — ABNORMAL LOW (ref >90)
Glucose: 120 mg/dl (ref 70–140)
Potassium: 4.5 mEq/L (ref 3.5–5.1)
Sodium: 140 mEq/L (ref 136–145)
Total Bilirubin: 1.02 mg/dL (ref 0.20–1.20)
Total Protein: 6.9 g/dL (ref 6.4–8.3)

## 2015-02-23 LAB — CBC WITH DIFFERENTIAL/PLATELET
BASO%: 0.5 % (ref 0.0–2.0)
Basophils Absolute: 0 10*3/uL (ref 0.0–0.1)
EOS%: 1.6 % (ref 0.0–7.0)
Eosinophils Absolute: 0.1 10*3/uL (ref 0.0–0.5)
HCT: 33.2 % — ABNORMAL LOW (ref 38.4–49.9)
HGB: 10.7 g/dL — ABNORMAL LOW (ref 13.0–17.1)
LYMPH%: 10.5 % — AB (ref 14.0–49.0)
MCH: 30.1 pg (ref 27.2–33.4)
MCHC: 32.2 g/dL (ref 32.0–36.0)
MCV: 93.5 fL (ref 79.3–98.0)
MONO#: 0.5 10*3/uL (ref 0.1–0.9)
MONO%: 9.8 % (ref 0.0–14.0)
NEUT%: 77.6 % — ABNORMAL HIGH (ref 39.0–75.0)
NEUTROS ABS: 4.2 10*3/uL (ref 1.5–6.5)
PLATELETS: 259 10*3/uL (ref 140–400)
RBC: 3.55 10*6/uL — ABNORMAL LOW (ref 4.20–5.82)
RDW: 20.3 % — ABNORMAL HIGH (ref 11.0–14.6)
WBC: 5.4 10*3/uL (ref 4.0–10.3)
lymph#: 0.6 10*3/uL — ABNORMAL LOW (ref 0.9–3.3)

## 2015-02-23 NOTE — Progress Notes (Signed)
Hematology and Oncology Follow Up Visit  Jeffrey Trujillo [id number] 09-17-1951     Principle Diagnosis: 64 year old with the following issues: old with the following issues:  1. T1b right kidney cancer diagnosed in 2010. He is S/P rdical nephrectomy on June 27, 2009. The pathological staging was T1b clear cell histology with Fuhrman grade 3/4.  2. Left Kidney mass  Measuring 4.5 x 2 7 x 3.6 irregular enhancing appears to be a new kidney cancer noted in 07/2012. 3. Pancreatic lesion. 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  Current therapy: Votrient  800 mg daily started in December 2013. Treatment held in 11/2012 due to increase LFTs. Votrient was resumed in April 2014 at 400 mg daily. The patient was recently hospitalized for elevated LFTs and his Votrient was placed on hold starting on 02/11/2015.  Interim History: Jeffrey Trujillo presents for a follow up visit with his wife and grandson. Since his last visit, he was hospitalized due to fever with abdominal pain and was found to have elevated LFTs possibly due to a gallstone. Due to the elevated LFTs his Votrient was placed on hold. The patient is here today to have his labs rechecked, discuss restarting Votrient, and to get a note to go back to work without restriction. The patient's abdominal pain has resolved at this time. He is having some intermittent nausea without really any vomiting. He does have Zofran at home which issues about twice since being discharged in the hospital last week. He is not having any fevers or chills. He denies chest pain and shortness of breath. Denies diarrhea and constipation. No bleeding. He did not report any headaches blurred vision double vision. Does not report any motor sensory neuropathy or alteration of mental status. He does not report any depression or anxiety. He does not report any frequency urgency or hesitancy. He does not report any other urinary symptoms.   Does not report any lymphadenopathy or any constitutional  symptoms. He does report some difficulty sleeping. Remainder of his review of systems is unremarkable.  Medications: Unchanged by my review today. Current Outpatient Prescriptions  Medication Sig Dispense Refill  . ondansetron (ZOFRAN ODT) 4 MG disintegrating tablet Take 1 tablet (4 mg total) by mouth every 8 (eight) hours as needed for nausea or vomiting. 20 tablet 0  . pantoprazole (PROTONIX) 40 MG tablet Take 1 tablet (40 mg total) by mouth 2 (two) times daily before a meal. 60 tablet 1  . topiramate (TOPAMAX) 25 MG tablet Take 25 mg by mouth 2 (two) times daily.    Marland Kitchen oxyCODONE-acetaminophen (PERCOCET/ROXICET) 5-325 MG per tablet Take 1 tablet by mouth every 4 (four) hours as needed. (Patient not taking: Reported on 02/23/2015) 15 tablet 0   No current facility-administered medications for this visit.     Allergies:  Allergies  Allergen Reactions  . Nsaids Other (See Comments)    Patient only has one kidney.   . Meperidine Hcl Hives and Rash    Past Medical History, Surgical history, Social history, and Family History were reviewed and updated.   Physical Exam: Blood pressure 106/63, pulse 66, temperature 98.5 F (36.9 C), temperature source Oral, resp. rate 18, height 5\' 11"  (1.803 m), weight 164 lb 11.2 oz (74.707 kg), SpO2 100 %. ECOG: 1 General appearance: alert and awake appeared in no active distress. Head: Normocephalic, without obvious abnormality.  Neck: no adenopathy, no masses Lymph nodes: Cervical, supraclavicular, and axillary nodes normal. Heart:regular rate and rhythm, S1, S2.  Lung:chest clear, no  wheezing, or dullness to percussion. Abdomen: soft, non-tender, without masses or organomegaly no shifting dullness or ascites EXT:no erythema, induration, or nodules. No edema. Neurological exam: no motor or sensory deficits noted. Skin: No rashes or lesions noted. Ecchymosis noted on his upper extremities. Neurological: Intact no deficits.  CBC    Component  Value Date/Time   WBC 5.4 02/23/2015 1231   WBC 4.3 02/15/2015 0624   RBC 3.55* 02/23/2015 1231   RBC 3.54* 02/15/2015 0624   HGB 10.7* 02/23/2015 1231   HGB 10.7* 02/15/2015 0624   HCT 33.2* 02/23/2015 1231   HCT 34.0* 02/15/2015 0624   PLT 259 02/23/2015 1231   PLT 232 02/15/2015 0624   MCV 93.5 02/23/2015 1231   MCV 96.0 02/15/2015 0624   MCH 30.1 02/23/2015 1231   MCH 30.2 02/15/2015 0624   MCHC 32.2 02/23/2015 1231   MCHC 31.5 02/15/2015 0624   RDW 20.3* 02/23/2015 1231   RDW 18.6* 02/15/2015 0624   LYMPHSABS 0.6* 02/23/2015 1231   LYMPHSABS 0.3* 02/11/2015 1435   MONOABS 0.5 02/23/2015 1231   MONOABS 0.3 02/11/2015 1435   EOSABS 0.1 02/23/2015 1231   EOSABS 0.0 02/11/2015 1435   BASOSABS 0.0 02/23/2015 1231   BASOSABS 0.0 02/11/2015 1435     Impression and Plan:   A 64 year old gentleman with the following issues:   1. A history of renal cell carcinoma. He had stage T1b diagnosed in October 2010 of the right kidney, status post right nephrectomy. He also has left kidney cancer and pancreatic metastasis. The patient had been on Votrient 400 mg daily since April 2014. This was stopped on May 27 due to elevated LFTs. The elevated LFTs were likely due to a gallstone. Discussed with Dr Alen Blew and the patient was instructed to resume his Votrient 400 mg daily.   2. Pancreatic lesion. Biopsy proven to be malignant and likely represent renal cell cancer. Regressed on Votrient at his last CT scan did not show any evidence of that at this time.  3. Thrombocytopenia: This is related to Votrient and has resolved at this time.  4. Anemia. Received 2 units of packed red blood cells during his recent hospitalization. Hemoglobin is stable compared with the hemoglobin from hospital discharge. We will continue to watch this.  5. Increase LFTs: Back to baseline. Will continue to follow this.   6. Weight loss: He has lost weight due to recent hospitalization but appetite is improving  and we will continue to watch this.  7. Follow-up: On June 17 as previously scheduled to be sure that his labs do not worsen with resuming the Votrient.  The patient was given a letter so that he may go back to work without any restriction.  Maryanna Shape NP 02/23/2015

## 2015-03-02 ENCOUNTER — Telehealth: Payer: Self-pay | Admitting: Oncology

## 2015-03-02 NOTE — Telephone Encounter (Signed)
Pt called to r/s 06/17 out of town, lft msg to c/b to r/s... KJ

## 2015-03-04 ENCOUNTER — Ambulatory Visit: Payer: 59 | Admitting: Nurse Practitioner

## 2015-03-04 ENCOUNTER — Other Ambulatory Visit: Payer: 59

## 2015-03-07 ENCOUNTER — Encounter: Payer: 59 | Admitting: Internal Medicine

## 2015-03-14 ENCOUNTER — Telehealth: Payer: Self-pay | Admitting: Oncology

## 2015-03-14 NOTE — Telephone Encounter (Signed)
Pt called lft msg to r/s I called pt back s/w wife confirming labs/ov r/s from 06/17 due to pt out of town... KJ

## 2015-03-16 ENCOUNTER — Other Ambulatory Visit: Payer: 59

## 2015-03-16 ENCOUNTER — Ambulatory Visit: Payer: 59 | Admitting: Oncology

## 2015-03-17 ENCOUNTER — Telehealth: Payer: Self-pay | Admitting: Oncology

## 2015-03-17 NOTE — Telephone Encounter (Signed)
Due to PAL moved 7/8 lab/FS to 7/29. Spoke with patient wife re change and mailed new schedule.

## 2015-03-25 ENCOUNTER — Ambulatory Visit: Payer: 59 | Admitting: Oncology

## 2015-03-25 ENCOUNTER — Other Ambulatory Visit: Payer: 59

## 2015-04-15 ENCOUNTER — Other Ambulatory Visit (HOSPITAL_BASED_OUTPATIENT_CLINIC_OR_DEPARTMENT_OTHER): Payer: 59

## 2015-04-15 ENCOUNTER — Telehealth: Payer: Self-pay | Admitting: Oncology

## 2015-04-15 ENCOUNTER — Ambulatory Visit (HOSPITAL_BASED_OUTPATIENT_CLINIC_OR_DEPARTMENT_OTHER): Payer: 59 | Admitting: Oncology

## 2015-04-15 VITALS — BP 127/65 | HR 78 | Temp 98.1°F | Resp 18 | Ht 71.0 in | Wt 169.0 lb

## 2015-04-15 DIAGNOSIS — D649 Anemia, unspecified: Secondary | ICD-10-CM | POA: Diagnosis not present

## 2015-04-15 DIAGNOSIS — C649 Malignant neoplasm of unspecified kidney, except renal pelvis: Secondary | ICD-10-CM

## 2015-04-15 DIAGNOSIS — D696 Thrombocytopenia, unspecified: Secondary | ICD-10-CM | POA: Diagnosis not present

## 2015-04-15 DIAGNOSIS — C7989 Secondary malignant neoplasm of other specified sites: Secondary | ICD-10-CM | POA: Diagnosis not present

## 2015-04-15 DIAGNOSIS — C642 Malignant neoplasm of left kidney, except renal pelvis: Secondary | ICD-10-CM

## 2015-04-15 DIAGNOSIS — C641 Malignant neoplasm of right kidney, except renal pelvis: Secondary | ICD-10-CM

## 2015-04-15 LAB — COMPREHENSIVE METABOLIC PANEL (CC13)
ALT: 29 U/L (ref 0–55)
AST: 47 U/L — AB (ref 5–34)
Albumin: 3.6 g/dL (ref 3.5–5.0)
Alkaline Phosphatase: 141 U/L (ref 40–150)
Anion Gap: 11 mEq/L (ref 3–11)
BUN: 23 mg/dL (ref 7.0–26.0)
CALCIUM: 8.6 mg/dL (ref 8.4–10.4)
CO2: 23 mEq/L (ref 22–29)
Chloride: 105 mEq/L (ref 98–109)
Creatinine: 1.5 mg/dL — ABNORMAL HIGH (ref 0.7–1.3)
EGFR: 48 mL/min/{1.73_m2} — AB (ref >90)
GLUCOSE: 112 mg/dL (ref 70–140)
Potassium: 3.5 mEq/L (ref 3.5–5.1)
SODIUM: 138 meq/L (ref 136–145)
TOTAL PROTEIN: 7.2 g/dL (ref 6.4–8.3)
Total Bilirubin: 1.77 mg/dL — ABNORMAL HIGH (ref 0.20–1.20)

## 2015-04-15 LAB — CBC WITH DIFFERENTIAL/PLATELET
BASO%: 0.3 % (ref 0.0–2.0)
Basophils Absolute: 0 10*3/uL (ref 0.0–0.1)
EOS ABS: 0 10*3/uL (ref 0.0–0.5)
EOS%: 1.2 % (ref 0.0–7.0)
HEMATOCRIT: 27.8 % — AB (ref 38.4–49.9)
HGB: 9.4 g/dL — ABNORMAL LOW (ref 13.0–17.1)
LYMPH#: 0.5 10*3/uL — AB (ref 0.9–3.3)
LYMPH%: 13.8 % — AB (ref 14.0–49.0)
MCH: 34.9 pg — AB (ref 27.2–33.4)
MCHC: 33.8 g/dL (ref 32.0–36.0)
MCV: 103.3 fL — AB (ref 79.3–98.0)
MONO#: 0.5 10*3/uL (ref 0.1–0.9)
MONO%: 15.9 % — AB (ref 0.0–14.0)
NEUT#: 2.3 10*3/uL (ref 1.5–6.5)
NEUT%: 68.8 % (ref 39.0–75.0)
PLATELETS: 61 10*3/uL — AB (ref 140–400)
RBC: 2.69 10*6/uL — ABNORMAL LOW (ref 4.20–5.82)
RDW: 24.1 % — AB (ref 11.0–14.6)
WBC: 3.4 10*3/uL — ABNORMAL LOW (ref 4.0–10.3)

## 2015-04-15 NOTE — Telephone Encounter (Signed)
Pt confirmed labs/ov per 07/29 POF, gave pt AVS and Calendar... KJ

## 2015-04-15 NOTE — Progress Notes (Signed)
Hematology and Oncology Follow Up Visit  Jeffrey Trujillo [id number] Feb 20, 1951     Principle Diagnosis: 64 year old with the following issues:  1. T1b right kidney cancer diagnosed in 2010. He is S/P rdical nephrectomy on June 27, 2009. The pathological staging was T1b clear cell histology with Fuhrman grade 3/4.  2. Left Kidney mass  Measuring 4.5 x 2 7 x 3.6 irregular enhancing appears to be a new kidney cancer noted in 07/2012. 3. Pancreatic lesion. 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  Current therapy: Votrient  800 mg daily started in December 2013. Treatment held in 11/2012 due to increase LFTs. Votrient was resumed in April 2014 at 400 mg daily. The patient was recently hospitalized for elevated LFTs and his Votrient was placed on hold starting on 02/11/2015.  This was resumed 5 days later.  Interim History: [name] presents for a follow up visit with his wife. Since his last visit, he reports doing very well. The patient's abdominal pain has resolved at this time. He is able to eat and have regained all his weight back. He does not report any diarrhea or GI symptoms. He is not having any fevers or chills. He denies chest pain and shortness of breath. No bleeding. He did not report any headaches blurred vision double vision. Does not report any motor sensory neuropathy or alteration of mental status. He does not report any depression or anxiety. He does not report any frequency urgency or hesitancy. He does not report any other urinary symptoms.   Does not report any lymphadenopathy or any constitutional symptoms. He does report some difficulty sleeping. Remainder of his review of systems is unremarkable.  Medications: Unchanged by my review today. Current Outpatient Prescriptions  Medication Sig Dispense Refill  . ondansetron (ZOFRAN ODT) 4 MG disintegrating tablet Take 1 tablet (4 mg total) by mouth every 8 (eight) hours as needed for nausea or vomiting. 20  tablet 0  . pantoprazole (PROTONIX) 40 MG tablet Take 1 tablet (40 mg total) by mouth 2 (two) times daily before a meal. 60 tablet 1  . Pazopanib HCl (VOTRIENT PO) Take 200 mg by mouth daily.     Marland Kitchen topiramate (TOPAMAX) 25 MG tablet Take 25 mg by mouth 2 (two) times daily.    Marland Kitchen oxyCODONE-acetaminophen (PERCOCET/ROXICET) 5-325 MG per tablet Take 1 tablet by mouth every 4 (four) hours as needed. (Patient not taking: Reported on 02/23/2015) 15 tablet 0   No current facility-administered medications for this visit.     Allergies:  Allergies  Allergen Reactions  . Nsaids Other (See Comments)    Patient only has one kidney.   . Meperidine Hcl Hives and Rash    Past Medical History, Surgical history, Social history, and Family History were reviewed and updated.   Physical Exam: Blood pressure 127/65, pulse 78, temperature 98.1 F (36.7 C), temperature source Oral, resp. rate 18, height 5\' 11"  (1.803 m), weight 169 lb (76.658 kg), SpO2 98 %. ECOG: 1 General appearance: alert and awake  Chronically ill appearing gentleman not in any distress. Head: Normocephalic, without obvious abnormality.  Neck: no adenopathy, no masses Lymph nodes: Cervical, supraclavicular, and axillary nodes normal. Heart:regular rate and rhythm, S1, S2.  Lung:chest clear, no wheezing, or dullness to percussion. Abdomen: soft, non-tender, without masses or organomegaly no shifting dullness or ascites EXT:no erythema, induration, or nodules. No edema. Neurological exam: no motor or sensory deficits noted. Skin: No rashes or lesions noted. Ecchymosis noted on  his upper extremities. Neurological: Intact no deficits.  CBC    Component Value Date/Time   WBC 3.4* 04/15/2015 1444   WBC 4.3 02/15/2015 0624   RBC 2.69* 04/15/2015 1444   RBC 3.54* 02/15/2015 0624   HGB 9.4* 04/15/2015 1444   HGB 10.7* 02/15/2015 0624   HCT 27.8* 04/15/2015 1444   HCT 34.0* 02/15/2015 0624   PLT 61* 04/15/2015 1444   PLT 232  02/15/2015 0624   MCV 103.3* 04/15/2015 1444   MCV 96.0 02/15/2015 0624   MCH 34.9* 04/15/2015 1444   MCH 30.2 02/15/2015 0624   MCHC 33.8 04/15/2015 1444   MCHC 31.5 02/15/2015 0624   RDW 24.1* 04/15/2015 1444   RDW 18.6* 02/15/2015 0624   LYMPHSABS 0.5* 04/15/2015 1444   LYMPHSABS 0.3* 02/11/2015 1435   MONOABS 0.5 04/15/2015 1444   MONOABS 0.3 02/11/2015 1435   EOSABS 0.0 04/15/2015 1444   EOSABS 0.0 02/11/2015 1435   BASOSABS 0.0 04/15/2015 1444   BASOSABS 0.0 02/11/2015 1435     Impression and Plan:   A 64 year old gentleman with the following issues:   1. A history of renal cell carcinoma. He had stage T1b diagnosed in October 2010 of the right kidney, status post right nephrectomy. He also has left kidney cancer and pancreatic metastasis. The patient had been on Votrient 400 mg daily since April 2014. This was stopped on May 27 due to elevated LFTs. The elevated LFTs were likely due to a gallstone  And his medication was resumed 12 days later. His imaging studies while he is hospitalized did not reveal any malignancy progression. The plan is to continue the same dose and schedule and repeat imaging studies in September 2016..   2. Pancreatic lesion. Biopsy proven to be malignant and likely represent renal cell cancer. Regressed on Votrient at his last CT scan did not show any  Progression of this tumor.  3. Thrombocytopenia:  It is rather fluctuating unlikely related to Votrient. We'll continue to monitor this closely malignant forward. He has no bleeding complications at this time.  4. Anemia.  Hemoglobin is stable at this time and asymptomatic.  5. Increase LFTs: Back to baseline. Will continue to follow this.   6. Weight loss: He has lost weight due to recent hospitalization but appetite is improving and we will continue to watch this.  7. Follow-up:  In one month for a clinical visit and recheck his laboratory testing.  [name] MD 04/15/2015

## 2015-04-25 ENCOUNTER — Other Ambulatory Visit: Payer: Self-pay | Admitting: Gastroenterology

## 2015-05-13 ENCOUNTER — Ambulatory Visit (HOSPITAL_BASED_OUTPATIENT_CLINIC_OR_DEPARTMENT_OTHER): Payer: 59 | Admitting: Oncology

## 2015-05-13 ENCOUNTER — Other Ambulatory Visit (HOSPITAL_BASED_OUTPATIENT_CLINIC_OR_DEPARTMENT_OTHER): Payer: 59

## 2015-05-13 ENCOUNTER — Telehealth: Payer: Self-pay | Admitting: Oncology

## 2015-05-13 VITALS — BP 156/66 | HR 78 | Temp 98.9°F | Resp 17 | Ht 71.0 in | Wt 163.7 lb

## 2015-05-13 DIAGNOSIS — C649 Malignant neoplasm of unspecified kidney, except renal pelvis: Secondary | ICD-10-CM

## 2015-05-13 DIAGNOSIS — C641 Malignant neoplasm of right kidney, except renal pelvis: Secondary | ICD-10-CM

## 2015-05-13 DIAGNOSIS — C642 Malignant neoplasm of left kidney, except renal pelvis: Secondary | ICD-10-CM | POA: Diagnosis not present

## 2015-05-13 DIAGNOSIS — C7889 Secondary malignant neoplasm of other digestive organs: Secondary | ICD-10-CM

## 2015-05-13 DIAGNOSIS — D6959 Other secondary thrombocytopenia: Secondary | ICD-10-CM

## 2015-05-13 DIAGNOSIS — D649 Anemia, unspecified: Secondary | ICD-10-CM

## 2015-05-13 LAB — CBC WITH DIFFERENTIAL/PLATELET
BASO%: 0.2 % (ref 0.0–2.0)
BASOS ABS: 0 10*3/uL (ref 0.0–0.1)
EOS%: 0.5 % (ref 0.0–7.0)
Eosinophils Absolute: 0 10*3/uL (ref 0.0–0.5)
HCT: 31.1 % — ABNORMAL LOW (ref 38.4–49.9)
HGB: 10.3 g/dL — ABNORMAL LOW (ref 13.0–17.1)
LYMPH%: 12.5 % — ABNORMAL LOW (ref 14.0–49.0)
MCH: 37.4 pg — AB (ref 27.2–33.4)
MCHC: 33.1 g/dL (ref 32.0–36.0)
MCV: 113 fL — ABNORMAL HIGH (ref 79.3–98.0)
MONO#: 0.4 10*3/uL (ref 0.1–0.9)
MONO%: 13.7 % (ref 0.0–14.0)
NEUT#: 2.4 10*3/uL (ref 1.5–6.5)
NEUT%: 73.1 % (ref 39.0–75.0)
Platelets: 44 10*3/uL — ABNORMAL LOW (ref 140–400)
RBC: 2.75 10*6/uL — AB (ref 4.20–5.82)
RDW: 19.3 % — ABNORMAL HIGH (ref 11.0–14.6)
WBC: 3.2 10*3/uL — ABNORMAL LOW (ref 4.0–10.3)
lymph#: 0.4 10*3/uL — ABNORMAL LOW (ref 0.9–3.3)

## 2015-05-13 LAB — COMPREHENSIVE METABOLIC PANEL (CC13)
ALT: 53 U/L (ref 0–55)
AST: 100 U/L — AB (ref 5–34)
Albumin: 3.6 g/dL (ref 3.5–5.0)
Alkaline Phosphatase: 131 U/L (ref 40–150)
Anion Gap: 9 mEq/L (ref 3–11)
BUN: 19.3 mg/dL (ref 7.0–26.0)
CALCIUM: 8.7 mg/dL (ref 8.4–10.4)
CHLORIDE: 105 meq/L (ref 98–109)
CO2: 26 meq/L (ref 22–29)
Creatinine: 1.7 mg/dL — ABNORMAL HIGH (ref 0.7–1.3)
EGFR: 43 mL/min/{1.73_m2} — ABNORMAL LOW (ref >90)
GLUCOSE: 111 mg/dL (ref 70–140)
POTASSIUM: 4.2 meq/L (ref 3.5–5.1)
SODIUM: 140 meq/L (ref 136–145)
Total Bilirubin: 1.32 mg/dL — ABNORMAL HIGH (ref 0.20–1.20)
Total Protein: 7.2 g/dL (ref 6.4–8.3)

## 2015-05-13 NOTE — Progress Notes (Signed)
Hematology and Oncology Follow Up Visit  DELMUS WARWICK [id number] Jun 29, 1951     Principle Diagnosis: 64 year old with the following issues:  1. T1b right kidney cancer diagnosed in 2010. He is S/P rdical nephrectomy on June 27, 2009. The pathological staging was T1b clear cell histology with Fuhrman grade 3/4.  2. Left Kidney mass  Measuring 4.5 x 2 7 x 3.6 irregular enhancing appears to be a new kidney cancer noted in 07/2012. 3. Pancreatic lesion. 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  Current therapy: Votrient  800 mg daily started in December 2013. Treatment held in 11/2012 due to increase LFTs. Votrient was resumed in April 2014 at 400 mg daily. The patient was recently hospitalized for elevated LFTs and his Votrient was placed on hold starting on 02/11/2015.  This was resumed 5 days later.  Interim History: [name] presents for a follow up visit. Since his last visit, he developed a lower back pain that was exacerbated by mobility. He was unable to lift heavy objects and was not able to work for a week. He does not recall trauma or injury prior to that. He did not have any neurological deficits. Did not have any neuropathy or loss of bowel or bladder control. His pain is improved this week and able to go back to work. He did have a gout flare an took an indomethacin periodically.   He does not report any new complications related to Votrient.He does not report any diarrhea or GI symptoms.  He is not having any fevers or chills. He denies chest pain and shortness of breath. No bleeding. He did not report any headaches blurred vision double vision. Does not report any motor sensory neuropathy or alteration of mental status. He does not report any depression or anxiety. He does not report any frequency urgency or hesitancy. He does not report any other urinary symptoms.   Does not report any lymphadenopathy or any constitutional symptoms. He does report some  difficulty sleeping. Remainder of his review of systems is unremarkable.  Medications: Unchanged by my review today. Current Outpatient Prescriptions  Medication Sig Dispense Refill  . pantoprazole (PROTONIX) 40 MG tablet TAKE (1) TABLET BY MOUTH TWICE A DAY BEFORE MEALS. (BREAKFAST AND SUPPER) 60 tablet 5  . Pazopanib HCl (VOTRIENT PO) Take 200 mg by mouth daily.     Marland Kitchen topiramate (TOPAMAX) 25 MG tablet Take 25 mg by mouth 2 (two) times daily.    . ondansetron (ZOFRAN ODT) 4 MG disintegrating tablet Take 1 tablet (4 mg total) by mouth every 8 (eight) hours as needed for nausea or vomiting. (Patient not taking: Reported on 05/13/2015) 20 tablet 0  . oxyCODONE-acetaminophen (PERCOCET/ROXICET) 5-325 MG per tablet Take 1 tablet by mouth every 4 (four) hours as needed. (Patient not taking: Reported on 02/23/2015) 15 tablet 0   No current facility-administered medications for this visit.     Allergies:  Allergies  Allergen Reactions  . Nsaids Other (See Comments)    Patient only has one kidney.   . Meperidine Hcl Hives and Rash    Past Medical History, Surgical history, Social history, and Family History were reviewed and updated.   Physical Exam: Blood pressure 156/66, pulse 78, temperature 98.9 F (37.2 C), temperature source Oral, resp. rate 17, height 5\' 11"  (1.803 m), weight 163 lb 11.2 oz (74.254 kg), SpO2 100 %. ECOG: 1 General appearance: alert and awake  Chronically ill appearing gentleman who appears in some distress.  Head: Normocephalic, without obvious abnormality.  Neck: no adenopathy, no masses Lymph nodes: Cervical, supraclavicular, and axillary nodes normal. Heart:regular rate and rhythm, S1, S2.  Lung:chest clear, no wheezing, or dullness to percussion. Abdomen: soft, non-tender, without masses or organomegaly no shifting dullness or ascites EXT:no erythema, induration, or nodules. No edema. Neurological exam: no motor or sensory deficits noted. Skin: No rashes or  lesions noted. Ecchymosis noted on his upper extremities. Faint petechial rash noted on his face as well. Neurological: Intact no deficits. He is able to ambulate without any difficulties. He did have pain in his right so because of gallops. He had good strength in his hip flexors and knee extension. No sensory deficits noted.  CBC    Component Value Date/Time   WBC 3.2* 05/13/2015 1431   WBC 4.3 02/15/2015 0624   RBC 2.75* 05/13/2015 1431   RBC 3.54* 02/15/2015 0624   HGB 10.3* 05/13/2015 1431   HGB 10.7* 02/15/2015 0624   HCT 31.1* 05/13/2015 1431   HCT 34.0* 02/15/2015 0624   PLT 44* 05/13/2015 1431   PLT 232 02/15/2015 0624   MCV 113.0* 05/13/2015 1431   MCV 96.0 02/15/2015 0624   MCH 37.4* 05/13/2015 1431   MCH 30.2 02/15/2015 0624   MCHC 33.1 05/13/2015 1431   MCHC 31.5 02/15/2015 0624   RDW 19.3* 05/13/2015 1431   RDW 18.6* 02/15/2015 0624   LYMPHSABS 0.4* 05/13/2015 1431   LYMPHSABS 0.3* 02/11/2015 1435   MONOABS 0.4 05/13/2015 1431   MONOABS 0.3 02/11/2015 1435   EOSABS 0.0 05/13/2015 1431   EOSABS 0.0 02/11/2015 1435   BASOSABS 0.0 05/13/2015 1431   BASOSABS 0.0 02/11/2015 1435     Impression and Plan:   A 64 year old gentleman with the following issues:   1. A history of renal cell carcinoma. He had stage T1b diagnosed in October 2010 of the right kidney, status post right nephrectomy. He also has left kidney cancer and pancreatic metastasis. The patient had been on Votrient 400 mg daily since April 2014.    His last staging CT scan was in May 2015 and showed he has relatively stable disease. I have instructed him to hold the Votrient for the time being given the constellation of symptoms including thrombocytopenia and worsening back pain. I'll obtain liver function tests on him today and repeat imaging studies for staging purposes in 2 weeks. It is very possible that if he has progressed on this medication, that he might require a different salvage regimen. If  his disease remains stable, we might resumed Votrient at a lower dose.  2. Pancreatic lesion. Biopsy proven to be malignant and likely represent renal cell cancer. Regressed on Votrient at his last CT scan did not show any  Progression of this tumor.  3. Thrombocytopenia:  Likely related to Votrient. It is quite severe at this time and I will hold his medication for at least 2 weeks and repeat a CBC at that time. He has no bleeding at this time but I cautioned him about complications related to that.  4. Anemia.  Hemoglobin is stable at this time and asymptomatic.  5. Increase LFTs: Back to baseline. These are repeated today.  6. Weight loss: Stable at this point.  7. Back pain: Unclear etiology could be muscle or bone in nature but we have to rule out malignancy. Imaging studies should help with that in the near future.  8. Follow-up:  In 2 weeks.   [name] MD 05/13/2015

## 2015-05-13 NOTE — Telephone Encounter (Signed)
Gave adn printed appt sched and avs for pt for Sept...gave barium

## 2015-05-26 ENCOUNTER — Other Ambulatory Visit (HOSPITAL_BASED_OUTPATIENT_CLINIC_OR_DEPARTMENT_OTHER): Payer: 59

## 2015-05-26 ENCOUNTER — Ambulatory Visit (HOSPITAL_COMMUNITY)
Admission: RE | Admit: 2015-05-26 | Discharge: 2015-05-26 | Disposition: A | Discharge status: Home / Self Care | Payer: 59 | Source: Ambulatory Visit | Attending: Oncology | Admitting: Oncology

## 2015-05-26 DIAGNOSIS — D6959 Other secondary thrombocytopenia: Secondary | ICD-10-CM

## 2015-05-26 DIAGNOSIS — D649 Anemia, unspecified: Secondary | ICD-10-CM | POA: Diagnosis not present

## 2015-05-26 DIAGNOSIS — I517 Cardiomegaly: Secondary | ICD-10-CM | POA: Insufficient documentation

## 2015-05-26 DIAGNOSIS — Z905 Acquired absence of kidney: Secondary | ICD-10-CM | POA: Insufficient documentation

## 2015-05-26 DIAGNOSIS — C642 Malignant neoplasm of left kidney, except renal pelvis: Secondary | ICD-10-CM

## 2015-05-26 DIAGNOSIS — K5730 Diverticulosis of large intestine without perforation or abscess without bleeding: Secondary | ICD-10-CM | POA: Insufficient documentation

## 2015-05-26 DIAGNOSIS — C641 Malignant neoplasm of right kidney, except renal pelvis: Secondary | ICD-10-CM

## 2015-05-26 DIAGNOSIS — C649 Malignant neoplasm of unspecified kidney, except renal pelvis: Secondary | ICD-10-CM | POA: Diagnosis present

## 2015-05-26 LAB — COMPREHENSIVE METABOLIC PANEL (CC13)
ALT: 34 U/L (ref 0–55)
ANION GAP: 13 meq/L — AB (ref 3–11)
AST: 89 U/L — ABNORMAL HIGH (ref 5–34)
Albumin: 3.6 g/dL (ref 3.5–5.0)
Alkaline Phosphatase: 120 U/L (ref 40–150)
BUN: 13.5 mg/dL (ref 7.0–26.0)
CALCIUM: 9.3 mg/dL (ref 8.4–10.4)
CO2: 23 meq/L (ref 22–29)
CREATININE: 1.3 mg/dL (ref 0.7–1.3)
Chloride: 110 mEq/L — ABNORMAL HIGH (ref 98–109)
EGFR: 59 mL/min/{1.73_m2} — AB (ref >90)
Glucose: 97 mg/dl (ref 70–140)
POTASSIUM: 3.9 meq/L (ref 3.5–5.1)
Sodium: 146 mEq/L — ABNORMAL HIGH (ref 136–145)
Total Bilirubin: 0.71 mg/dL (ref 0.20–1.20)
Total Protein: 7.5 g/dL (ref 6.4–8.3)

## 2015-05-26 LAB — CBC WITH DIFFERENTIAL/PLATELET
BASO%: 1 % (ref 0.0–2.0)
Basophils Absolute: 0 10*3/uL (ref 0.0–0.1)
EOS%: 3.5 % (ref 0.0–7.0)
Eosinophils Absolute: 0.1 10*3/uL (ref 0.0–0.5)
HEMATOCRIT: 31.2 % — AB (ref 38.4–49.9)
HGB: 10.3 g/dL — ABNORMAL LOW (ref 13.0–17.1)
LYMPH%: 37.5 % (ref 14.0–49.0)
MCH: 35 pg — ABNORMAL HIGH (ref 27.2–33.4)
MCHC: 33 g/dL (ref 32.0–36.0)
MCV: 106.1 fL — AB (ref 79.3–98.0)
MONO#: 0.2 10*3/uL (ref 0.1–0.9)
MONO%: 6 % (ref 0.0–14.0)
NEUT#: 2.1 10*3/uL (ref 1.5–6.5)
NEUT%: 52 % (ref 39.0–75.0)
Platelets: 152 10*3/uL (ref 140–400)
RBC: 2.94 10*6/uL — AB (ref 4.20–5.82)
RDW: 17.4 % — ABNORMAL HIGH (ref 11.0–14.6)
WBC: 4 10*3/uL (ref 4.0–10.3)
lymph#: 1.5 10*3/uL (ref 0.9–3.3)

## 2015-05-26 MED ORDER — IOHEXOL 300 MG/ML  SOLN
75.0000 mL | Freq: Once | INTRAMUSCULAR | Status: AC | PRN
  Administered 2015-05-26: 75 mL via INTRAVENOUS

## 2015-05-27 ENCOUNTER — Telehealth: Payer: Self-pay | Admitting: Oncology

## 2015-05-27 ENCOUNTER — Ambulatory Visit (HOSPITAL_BASED_OUTPATIENT_CLINIC_OR_DEPARTMENT_OTHER): Payer: 59 | Admitting: Oncology

## 2015-05-27 VITALS — BP 143/73 | HR 102 | Temp 98.1°F | Resp 18 | Ht 71.0 in | Wt 166.3 lb

## 2015-05-27 DIAGNOSIS — C642 Malignant neoplasm of left kidney, except renal pelvis: Secondary | ICD-10-CM | POA: Diagnosis not present

## 2015-05-27 DIAGNOSIS — D6959 Other secondary thrombocytopenia: Secondary | ICD-10-CM

## 2015-05-27 DIAGNOSIS — C649 Malignant neoplasm of unspecified kidney, except renal pelvis: Secondary | ICD-10-CM

## 2015-05-27 DIAGNOSIS — D649 Anemia, unspecified: Secondary | ICD-10-CM | POA: Diagnosis not present

## 2015-05-27 NOTE — Progress Notes (Signed)
Hematology and Oncology Follow Up Visit  Jeffrey Trujillo [id number] 16-May-1951     Principle Diagnosis: 64 year old with the following issues:  1. T1b right kidney cancer diagnosed in 2010. He is S/P rdical nephrectomy on June 27, 2009. The pathological staging was T1b clear cell histology with Fuhrman grade 3/4.  2. Left Kidney mass  Measuring 4.5 x 2 7 x 3.6 irregular enhancing appears to be a new kidney cancer noted in 07/2012. 3. Pancreatic lesion. 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  Current therapy: Votrient  800 mg daily started in December 2013. Treatment held in 11/2012 due to increase LFTs. Votrient was resumed in April 2014 at 400 mg daily.   Interim History: Jeffrey Trujillo presents for a follow up visit. Since his last visit, he is doing well without any new complaints. His back pain has improved at this time. He still have lower extremity edema that has been relatively chronic in nature. He has not reported any bruising or bleeding complications at this time.  Votrient has been on hold for the last 2 weeks without any major changes in his clinical status. Has not reported any recent hospitalization or illnesses.  He is not having any fevers or chills. He denies chest pain and shortness of breath. No bleeding. He did not report any headaches blurred vision double vision. Does not report any motor sensory neuropathy or alteration of mental status. He does not report any depression or anxiety. He does not report any frequency urgency or hesitancy. He does not report any other urinary symptoms.   Does not report any lymphadenopathy or any constitutional symptoms. He does report some difficulty sleeping. Remainder of his review of systems is unremarkable.  Medications: Unchanged by my review today. Current Outpatient Prescriptions  Medication Sig Dispense Refill  . ondansetron (ZOFRAN ODT) 4 MG disintegrating tablet Take 1 tablet (4 mg total) by mouth every 8  (eight) hours as needed for nausea or vomiting. 20 tablet 0  . oxyCODONE-acetaminophen (PERCOCET/ROXICET) 5-325 MG per tablet Take 1 tablet by mouth every 4 (four) hours as needed. 15 tablet 0  . pantoprazole (PROTONIX) 40 MG tablet TAKE (1) TABLET BY MOUTH TWICE A DAY BEFORE MEALS. (BREAKFAST AND SUPPER) 60 tablet 5  . topiramate (TOPAMAX) 25 MG tablet Take 25 mg by mouth 2 (two) times daily.    . Pazopanib HCl (VOTRIENT PO) Take 200 mg by mouth daily.      No current facility-administered medications for this visit.     Allergies:  Allergies  Allergen Reactions  . Nsaids Other (See Comments)    Patient only has one kidney.   . Meperidine Hcl Hives and Rash    Past Medical History, Surgical history, Social history, and Family History were reviewed and updated.   Physical Exam: Blood pressure 143/73, pulse 102, temperature 98.1 F (36.7 C), temperature source Oral, resp. rate 18, height 5\' 11"  (1.803 m), weight 166 lb 4.8 oz (75.433 kg), SpO2 97 %. ECOG: 1 General appearance: alert and awake  not in any distress. Head: Normocephalic, without obvious abnormality.  Neck: no adenopathy, no masses Lymph nodes: Cervical, supraclavicular, and axillary nodes normal. Heart:regular rate and rhythm, S1, S2.  Lung:chest clear, no wheezing, or dullness to percussion. Abdomen: soft, non-tender, without masses or organomegaly no shifting dullness or ascites EXT:no erythema, induration, or nodules. No edema. Neurological exam: no motor or sensory deficits noted. Skin: No rashes or lesions noted. Ecchymosis noted on his upper extremities.  Neurological: Intact no deficits. He is able to ambulate without any difficulties.   CBC    Component Value Date/Time   WBC 4.0 05/26/2015 1017   WBC 4.3 02/15/2015 0624   RBC 2.94* 05/26/2015 1017   RBC 3.54* 02/15/2015 0624   HGB 10.3* 05/26/2015 1017   HGB 10.7* 02/15/2015 0624   HCT 31.2* 05/26/2015 1017   HCT 34.0* 02/15/2015 0624   PLT 152  05/26/2015 1017   PLT 232 02/15/2015 0624   MCV 106.1* 05/26/2015 1017   MCV 96.0 02/15/2015 0624   MCH 35.0* 05/26/2015 1017   MCH 30.2 02/15/2015 0624   MCHC 33.0 05/26/2015 1017   MCHC 31.5 02/15/2015 0624   RDW 17.4* 05/26/2015 1017   RDW 18.6* 02/15/2015 0624   LYMPHSABS 1.5 05/26/2015 1017   LYMPHSABS 0.3* 02/11/2015 1435   MONOABS 0.2 05/26/2015 1017   MONOABS 0.3 02/11/2015 1435   EOSABS 0.1 05/26/2015 1017   EOSABS 0.0 02/11/2015 1435   BASOSABS 0.0 05/26/2015 1017   BASOSABS 0.0 02/11/2015 1435   CLINICAL DATA: Right nephrectomy. Prior right renal cancer with metastatic disease to the pancreas. Ongoing chemotherapy. Left lobectomy. Hernia repair. Gout.  EXAM: CT CHEST, ABDOMEN, AND PELVIS WITH CONTRAST  TECHNIQUE: Multidetector CT imaging of the chest, abdomen and pelvis was performed following the standard protocol during bolus administration of intravenous contrast.  CONTRAST: 67mL OMNIPAQUE IOHEXOL 300 MG/ML SOLN  COMPARISON: Multiple exams, including 01/18/2015 and 02/12/2015  FINDINGS: CT CHEST FINDINGS  Mediastinum/Nodes: Mild atherosclerotic calcification of the aortic arch. Mild cardiomegaly.  Lungs/Pleura: Left lower lobe wedge resection in the past. Scarring in the left lower lobe with mild nodularity along the confluent bronchovascular structures inferiorly on image 63 series 8 examining this area on multiplanar imaging, I suspect that there is a plugged bronchus in this vicinity contributing to the nodularity, although generally I feel that this area merits attention on followup studies, particularly in the vicinity of image 63.  Musculoskeletal: Resection of much of the left sixth rib.  CT ABDOMEN PELVIS FINDINGS  Hepatobiliary: Diffuse hepatic steatosis.  Pancreas: Subtle nodularity along the right lateral margin of the pancreatic head, markedly reduced from 2013 and nearly imperceptible today, possibly reflecting  residua from the prior metastatic lesion.  Spleen: Unremarkable  Adrenals/Urinary Tract: Right nephrectomy. Solid enhancing 4.5 by 5.3 cm mass in the left kidney upper pole on image 48 of series 4, previously the same when measured in a similar fashion on the prior exam. Adjacent left kidney upper pole Bosniak category 1 cyst.  Stomach/Bowel: Sigmoid colon diverticulosis. Low position of anorectal junction with poor definition of the anorectal junction. 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 Mild perirectal stranding.  Vascular/Lymphatic: Unremarkable  Reproductive: Curvilinear calcifications the prostate gland. Poor definition of fat planes between the prostate gland and rectum, as before.  Other: No supplemental non-categorized findings.  Musculoskeletal: Prior inguinal hernia repairs.  IMPRESSION: 1. Essentially stable appearance of the 5.3 cm left kidney upper pole renal cell carcinoma. 2. The slight nodularity along the right lateral margin of the pancreatic head probably represents residua from the prior mid metastatic lesion in this vicinity. It is currently virtually undetectable. 3. Scarring in the left lower lobe probably related to the wedge resection. There is some slight nodularity over long the scarring which is unchanged but likely merits observation. 4. Other imaging findings of potential clinical significance: Diffuse hepatic steatosis. Right nephrectomy. Sigmoid diverticulosis. 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 Mild cardiomegaly.   Impression and Plan:  A 64 year old gentleman with the following issues:   1. A history of renal cell carcinoma. He had stage T1b diagnosed in October 2010 of the right kidney, status post right nephrectomy. He also has left kidney cancer and pancreatic metastasis. The patient had been on Votrient 400 mg daily since April 2014.   CT scan obtained on 05/26/2015 was reviewed with the  patient and his wife. He appears to have stable disease at this time without any evidence of progression or relapse. His pancreatic lesion is not detectable and his kidney tumor is about the same size. No new lesions were noted at this time.  Options of treatment at this time were reviewed including resuming Votrient at a lower dose versus using a different agent. Given the excellent benefit associated with this medication, the plan is to resume Votrient at 200 mg daily dose. We will repeat imaging studies in 4 months.   2. Pancreatic lesion. Biopsy proven to be malignant and likely represent renal cell cancer. Regressed on Votrient at his last CT scan did not show any  Progression of this tumor.  3. Thrombocytopenia:  Likely related to Votrient. His platelet count has normalized at this time. The plan is to recheck his count in 2 weeks to ensure stability on the lower dose of Votrient.   4. Anemia.  Hemoglobin is stable at this time and asymptomatic.  5. Increase LFTs: Back to baseline.   6. Weight loss: Stable at this point.  7. Back pain: Appears to have improved at this time without any evidence of metastasis.  8. Follow-up:  In 2 weeks to recheck his labs and a clinical visit in 4-5 weeks.  [name] MD 05/27/2015

## 2015-05-27 NOTE — Progress Notes (Signed)
Lm for barb neff dietitian. Patient would like samples of vanilla ensure. They will call before next visit to request samples. They live out of town.

## 2015-05-27 NOTE — Telephone Encounter (Signed)
Gave and pritned appt sched and avs fo rpt for Sept adn OCT

## 2015-06-06 ENCOUNTER — Telehealth: Payer: Self-pay | Admitting: Nutrition

## 2015-06-06 NOTE — Telephone Encounter (Signed)
Received message from RN that patient requesting samples of oral nutrition supplements.   Contacted patient, however he was not at home.   Patient has an appointment on Friday to come to the [facility].   Left a message for patient to contact me if he would like to pick up samples on Friday, September 23 Left my contact information.

## 2015-06-06 NOTE — Telephone Encounter (Signed)
Family member returned call for patient requesting case of vanilla Ensure Plus left for him on Friday, September 23. I will leave one case of complementary Ensure Plus at the front desk for him to pick up.

## 2015-06-10 ENCOUNTER — Other Ambulatory Visit (HOSPITAL_BASED_OUTPATIENT_CLINIC_OR_DEPARTMENT_OTHER): Payer: 59

## 2015-06-10 DIAGNOSIS — C649 Malignant neoplasm of unspecified kidney, except renal pelvis: Secondary | ICD-10-CM

## 2015-06-10 LAB — CBC WITH DIFFERENTIAL/PLATELET
BASO%: 1.7 % (ref 0.0–2.0)
BASOS ABS: 0.1 10*3/uL (ref 0.0–0.1)
EOS ABS: 0 10*3/uL (ref 0.0–0.5)
EOS%: 1.6 % (ref 0.0–7.0)
HEMATOCRIT: 28.1 % — AB (ref 38.4–49.9)
HGB: 9.2 g/dL — ABNORMAL LOW (ref 13.0–17.1)
LYMPH#: 0.5 10*3/uL — AB (ref 0.9–3.3)
LYMPH%: 15.5 % (ref 14.0–49.0)
MCH: 33.9 pg — AB (ref 27.2–33.4)
MCHC: 32.5 g/dL (ref 32.0–36.0)
MCV: 104.3 fL — AB (ref 79.3–98.0)
MONO#: 0.3 10*3/uL (ref 0.1–0.9)
MONO%: 10.9 % (ref 0.0–14.0)
NEUT#: 2.1 10*3/uL (ref 1.5–6.5)
NEUT%: 70.3 % (ref 39.0–75.0)
PLATELETS: 238 10*3/uL (ref 140–400)
RBC: 2.7 10*6/uL — AB (ref 4.20–5.82)
RDW: 19 % — ABNORMAL HIGH (ref 11.0–14.6)
WBC: 3 10*3/uL — ABNORMAL LOW (ref 4.0–10.3)

## 2015-06-10 LAB — COMPREHENSIVE METABOLIC PANEL (CC13)
ALT: 41 U/L (ref 0–55)
AST: 48 U/L — ABNORMAL HIGH (ref 5–34)
Albumin: 3.1 g/dL — ABNORMAL LOW (ref 3.5–5.0)
Alkaline Phosphatase: 112 U/L (ref 40–150)
Anion Gap: 9 mEq/L (ref 3–11)
BILIRUBIN TOTAL: 0.62 mg/dL (ref 0.20–1.20)
BUN: 12 mg/dL (ref 7.0–26.0)
CALCIUM: 8.7 mg/dL (ref 8.4–10.4)
CHLORIDE: 114 meq/L — AB (ref 98–109)
CO2: 23 meq/L (ref 22–29)
CREATININE: 1.7 mg/dL — AB (ref 0.7–1.3)
EGFR: 41 mL/min/{1.73_m2} — AB (ref >90)
Glucose: 111 mg/dl (ref 70–140)
Potassium: 3.7 mEq/L (ref 3.5–5.1)
Sodium: 147 mEq/L — ABNORMAL HIGH (ref 136–145)
Total Protein: 6.8 g/dL (ref 6.4–8.3)

## 2015-07-01 ENCOUNTER — Ambulatory Visit (HOSPITAL_BASED_OUTPATIENT_CLINIC_OR_DEPARTMENT_OTHER): Payer: 59 | Admitting: Oncology

## 2015-07-01 ENCOUNTER — Telehealth: Payer: Self-pay | Admitting: Oncology

## 2015-07-01 ENCOUNTER — Other Ambulatory Visit (HOSPITAL_BASED_OUTPATIENT_CLINIC_OR_DEPARTMENT_OTHER): Payer: 59

## 2015-07-01 VITALS — BP 144/68 | HR 66 | Temp 97.8°F | Resp 18 | Ht 71.0 in | Wt 174.2 lb

## 2015-07-01 DIAGNOSIS — C641 Malignant neoplasm of right kidney, except renal pelvis: Secondary | ICD-10-CM

## 2015-07-01 DIAGNOSIS — C7889 Secondary malignant neoplasm of other digestive organs: Secondary | ICD-10-CM

## 2015-07-01 DIAGNOSIS — D696 Thrombocytopenia, unspecified: Secondary | ICD-10-CM

## 2015-07-01 DIAGNOSIS — C642 Malignant neoplasm of left kidney, except renal pelvis: Secondary | ICD-10-CM

## 2015-07-01 DIAGNOSIS — D649 Anemia, unspecified: Secondary | ICD-10-CM

## 2015-07-01 DIAGNOSIS — C649 Malignant neoplasm of unspecified kidney, except renal pelvis: Secondary | ICD-10-CM

## 2015-07-01 LAB — CBC WITH DIFFERENTIAL/PLATELET
BASO%: 0.4 % (ref 0.0–2.0)
BASOS ABS: 0 10*3/uL (ref 0.0–0.1)
EOS ABS: 0 10*3/uL (ref 0.0–0.5)
EOS%: 0.8 % (ref 0.0–7.0)
HEMATOCRIT: 28.4 % — AB (ref 38.4–49.9)
HEMOGLOBIN: 9.5 g/dL — AB (ref 13.0–17.1)
LYMPH#: 0.4 10*3/uL — AB (ref 0.9–3.3)
LYMPH%: 7.4 % — ABNORMAL LOW (ref 14.0–49.0)
MCH: 34.1 pg — ABNORMAL HIGH (ref 27.2–33.4)
MCHC: 33.3 g/dL (ref 32.0–36.0)
MCV: 102.5 fL — AB (ref 79.3–98.0)
MONO#: 0.5 10*3/uL (ref 0.1–0.9)
MONO%: 8.8 % (ref 0.0–14.0)
NEUT#: 4.5 10*3/uL (ref 1.5–6.5)
NEUT%: 82.6 % — AB (ref 39.0–75.0)
PLATELETS: 223 10*3/uL (ref 140–400)
RBC: 2.77 10*6/uL — ABNORMAL LOW (ref 4.20–5.82)
RDW: 22.4 % — AB (ref 11.0–14.6)
WBC: 5.4 10*3/uL (ref 4.0–10.3)

## 2015-07-01 LAB — COMPREHENSIVE METABOLIC PANEL (CC13)
ALBUMIN: 2.9 g/dL — AB (ref 3.5–5.0)
ALK PHOS: 92 U/L (ref 40–150)
ALT: 44 U/L (ref 0–55)
AST: 28 U/L (ref 5–34)
Anion Gap: 8 mEq/L (ref 3–11)
BUN: 22.9 mg/dL (ref 7.0–26.0)
CALCIUM: 9.2 mg/dL (ref 8.4–10.4)
CO2: 26 mEq/L (ref 22–29)
Chloride: 108 mEq/L (ref 98–109)
Creatinine: 1.7 mg/dL — ABNORMAL HIGH (ref 0.7–1.3)
EGFR: 43 mL/min/{1.73_m2} — ABNORMAL LOW (ref >90)
Glucose: 103 mg/dl (ref 70–140)
Potassium: 3.7 mEq/L (ref 3.5–5.1)
Sodium: 143 mEq/L (ref 136–145)
Total Bilirubin: 0.57 mg/dL (ref 0.20–1.20)
Total Protein: 6.7 g/dL (ref 6.4–8.3)

## 2015-07-01 NOTE — Telephone Encounter (Signed)
per pof to sch pt appt-gave pt copy of avs °

## 2015-07-01 NOTE — Progress Notes (Signed)
Hematology and Oncology Follow Up Visit  Jeffrey Trujillo [id number] 1950/12/25     Principle Diagnosis: 65 year old with the following issues:  1. T1b right kidney cancer diagnosed in 2010. He is S/P rdical nephrectomy on June 27, 2009. The pathological staging was T1b clear cell histology with Fuhrman grade 3/4.  2. Left Kidney mass  Measuring 4.5 x 2 7 x 3.6 irregular enhancing appears to be a new kidney cancer noted in 07/2012. 3. Pancreatic lesion. 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  Current therapy: Votrient  800 mg daily started in December 2013. Treatment held in 11/2012 due to increase LFTs. Votrient was resumed in April 2014 at 400 mg daily. His dose was reduced further to 200 mg daily starting in September 2016.  Interim History: Jeffrey Trujillo presents for a follow up visit. Since his last visit, he is doing much better since the last visit. He reports his energy is much improved and you have gained more weight. He has tolerated the current dose of Votrient at a much better level. He does not report any diarrhea, rash or excessive fatigue.  He has report a gout episode in his knee and toe which currently resolving. His back pain has improved at this time. He still have lower extremity edema that has been relatively chronic in nature. He has not reported any bruising or bleeding complications at this time.    He is not having any fevers or chills. He denies chest pain and shortness of breath. No bleeding. He did not report any headaches blurred vision double vision. Does not report any motor sensory neuropathy or alteration of mental status. He does not report any depression or anxiety. He does not report any frequency urgency or hesitancy. He does not report any other urinary symptoms.   Does not report any lymphadenopathy or any constitutional symptoms. He does report some difficulty sleeping. Remainder of his review of systems is unremarkable.  Medications:  Unchanged by my review today. Current Outpatient Prescriptions  Medication Sig Dispense Refill  . methylPREDNISolone (MEDROL DOSEPAK) 4 MG TBPK tablet Take 10 mg by mouth daily.    . ondansetron (ZOFRAN ODT) 4 MG disintegrating tablet Take 1 tablet (4 mg total) by mouth every 8 (eight) hours as needed for nausea or vomiting. 20 tablet 0  . oxyCODONE-acetaminophen (PERCOCET/ROXICET) 5-325 MG per tablet Take 1 tablet by mouth every 4 (four) hours as needed. 15 tablet 0  . pantoprazole (PROTONIX) 40 MG tablet TAKE (1) TABLET BY MOUTH TWICE A DAY BEFORE MEALS. (BREAKFAST AND SUPPER) 60 tablet 5  . Pazopanib HCl (VOTRIENT PO) Take 200 mg by mouth daily.     Marland Kitchen topiramate (TOPAMAX) 25 MG tablet Take 25 mg by mouth 2 (two) times daily.     No current facility-administered medications for this visit.     Allergies:  Allergies  Allergen Reactions  . Nsaids Other (See Comments)    Patient only has one kidney.   . Meperidine Hcl Hives and Rash    Past Medical History, Surgical history, Social history, and Family History were reviewed and updated.   Physical Exam: Blood pressure 144/68, pulse 66, temperature 97.8 F (36.6 C), temperature source Oral, resp. rate 18, height 5\' 11"  (1.803 m), weight 174 lb 3.2 oz (79.017 kg), SpO2 99 %. ECOG: 1 General appearance: alert and awake  well-appearing gentleman without distress today. Head: Normocephalic, without obvious abnormality. No oral thrush.  Neck: no adenopathy, no masses Lymph nodes:  Cervical, supraclavicular, and axillary nodes normal. Heart:regular rate and rhythm, S1, S2.  Lung:chest clear, no wheezing, or dullness to percussion. Abdomen: soft, non-tender, without masses or organomegaly no shifting dullness or ascites EXT:no erythema, induration, or nodules. No edema. Neurological exam: no motor or sensory deficits noted. Skin: No rashes or lesions noted. no petechiae or ecchymosis.  Neurological: Intact no deficits. He is able to  ambulate without any difficulties.   CBC    Component Value Date/Time   WBC 5.4 07/01/2015 1415   WBC 4.3 02/15/2015 0624   RBC 2.77* 07/01/2015 1415   RBC 3.54* 02/15/2015 0624   HGB 9.5* 07/01/2015 1415   HGB 10.7* 02/15/2015 0624   HCT 28.4* 07/01/2015 1415   HCT 34.0* 02/15/2015 0624   PLT 223 07/01/2015 1415   PLT 232 02/15/2015 0624   MCV 102.5* 07/01/2015 1415   MCV 96.0 02/15/2015 0624   MCH 34.1* 07/01/2015 1415   MCH 30.2 02/15/2015 0624   MCHC 33.3 07/01/2015 1415   MCHC 31.5 02/15/2015 0624   RDW 22.4* 07/01/2015 1415   RDW 18.6* 02/15/2015 0624   LYMPHSABS 0.4* 07/01/2015 1415   LYMPHSABS 0.3* 02/11/2015 1435   MONOABS 0.5 07/01/2015 1415   MONOABS 0.3 02/11/2015 1435   EOSABS 0.0 07/01/2015 1415   EOSABS 0.0 02/11/2015 1435   BASOSABS 0.0 07/01/2015 1415   BASOSABS 0.0 02/11/2015 1435     Impression and Plan:   A 64 year old gentleman with the following issues:   1. A history of renal cell carcinoma. He had stage T1b diagnosed in October 2010 of the right kidney, status post right nephrectomy. He also has left kidney cancer and pancreatic metastasis. He was initially started on Votrient in December 2013. He had dose reduction on few occasions. The patient currently on Votrient 200 mg daily since September 2016 dose reduction.  CT scan obtained on 05/26/2015 showed  stable disease at this time without any evidence of progression or relapse. His pancreatic lesion is not detectable and his kidney tumor is about the same size. No new lesions were noted at this time.  The plan is to continue with Votrient at the same dose and schedule and repeat imaging studies in 3 months. He tolerated this current dose much better and have gained more weight and energy.   2. Pancreatic lesion. Biopsy proven to be malignant and likely represent renal cell cancer. Regressed on Votrient at his last CT scan did not show any  Progression of this tumor.  3. Thrombocytopenia:   Likely related to Votrient. His platelet count has normalized at this time. no further dose reduction is needed.   4. Anemia.  Hemoglobin is stable at this time and asymptomatic.  5. Increase LFTs:  related to Votrient his current transaminases are perfectly normal range.   6. Weight loss: his weight has been better since the last visit with the dose reduction.   7. Back pain: Appears to have improved at this time without any evidence of metastasis.  8. Follow-up:  4-5 weeks for a checkup.   [name] MD 07/01/2015

## 2015-07-01 NOTE — Progress Notes (Signed)
This RN faxed today's lab results to Dr. Neomia Dear. Fax # [phone].

## 2015-07-05 ENCOUNTER — Telehealth: Payer: Self-pay | Admitting: *Deleted

## 2015-07-05 NOTE — Telephone Encounter (Signed)
TC from pt's wife requesting recent labs (07/01/15) be fax'd to Dr. Neomia Dear 's office. Fax # [phone]. This was done today @ approx. 11:30 am

## 2015-07-23 ENCOUNTER — Encounter (HOSPITAL_COMMUNITY): Payer: Self-pay

## 2015-07-23 ENCOUNTER — Emergency Department (HOSPITAL_COMMUNITY)
Admission: EM | Admit: 2015-07-23 | Discharge: 2015-07-23 | Discharge status: Against Medical Advice | Payer: 59 | Attending: Emergency Medicine | Admitting: Emergency Medicine

## 2015-07-23 DIAGNOSIS — W2209XA Striking against other stationary object, initial encounter: Secondary | ICD-10-CM | POA: Diagnosis not present

## 2015-07-23 DIAGNOSIS — Y9389 Activity, other specified: Secondary | ICD-10-CM | POA: Diagnosis not present

## 2015-07-23 DIAGNOSIS — Y9289 Other specified places as the place of occurrence of the external cause: Secondary | ICD-10-CM | POA: Insufficient documentation

## 2015-07-23 DIAGNOSIS — Y998 Other external cause status: Secondary | ICD-10-CM | POA: Insufficient documentation

## 2015-07-23 DIAGNOSIS — S41111A Laceration without foreign body of right upper arm, initial encounter: Secondary | ICD-10-CM
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
## 2015-07-23 NOTE — ED Notes (Signed)
Pt became upset and started cussing at visitor, Lily Kocher, PA went in to see pt and pt started to cuss at the PA and started toward him while yelling.  Security had been called and once arrived to room, Marshell Levan, [place] asked them to escort pt out of the dept.  Pt had stated earlier about leaving and did go back to room.  Once pt became upset, pt wanting to leave to go home. Pt left with steady gait with security.

## 2015-07-23 NOTE — ED Notes (Signed)
Patient hit right upper arm on fencing post. Laceration noted, bleeding controlled. Last tetanus 7 years ago

## 2015-07-23 NOTE — ED Provider Notes (Signed)
12:40pm Pt began yelling and cursing in  Room 21. He continued to curse at the male in the room. I went to as the patient to lower his voice and to stop using inappropriate language as we have other patients in the department and a child across the hall. The patient said he did not give a God dam who was in the hospital. He then proceeded to call me a GD son -of-a-bitch. He got out of the bed, started coming toward me an said are you going to fix my arm or not.  I informed him there were patients ahead of him, but I would be with him shortly. He then said fuck you, you son -of a- bitch. I then ask security to escourt the patient off the campus.  Lily Kocher, PA-C 07/23/15 1254  Milton Ferguson, MD 07/23/15 1500

## 2015-08-10 ENCOUNTER — Ambulatory Visit (HOSPITAL_BASED_OUTPATIENT_CLINIC_OR_DEPARTMENT_OTHER): Payer: 59 | Admitting: Oncology

## 2015-08-10 ENCOUNTER — Telehealth: Payer: Self-pay | Admitting: Oncology

## 2015-08-10 ENCOUNTER — Other Ambulatory Visit (HOSPITAL_BASED_OUTPATIENT_CLINIC_OR_DEPARTMENT_OTHER): Payer: 59

## 2015-08-10 VITALS — BP 159/85 | HR 82 | Temp 97.5°F | Resp 18 | Ht 71.0 in | Wt 171.4 lb

## 2015-08-10 DIAGNOSIS — C649 Malignant neoplasm of unspecified kidney, except renal pelvis: Secondary | ICD-10-CM

## 2015-08-10 DIAGNOSIS — C642 Malignant neoplasm of left kidney, except renal pelvis: Secondary | ICD-10-CM

## 2015-08-10 DIAGNOSIS — D696 Thrombocytopenia, unspecified: Secondary | ICD-10-CM | POA: Diagnosis not present

## 2015-08-10 DIAGNOSIS — C7889 Secondary malignant neoplasm of other digestive organs: Secondary | ICD-10-CM

## 2015-08-10 DIAGNOSIS — R7989 Other specified abnormal findings of blood chemistry: Secondary | ICD-10-CM

## 2015-08-10 DIAGNOSIS — Z9181 History of falling: Secondary | ICD-10-CM

## 2015-08-10 DIAGNOSIS — D649 Anemia, unspecified: Secondary | ICD-10-CM

## 2015-08-10 DIAGNOSIS — Z85528 Personal history of other malignant neoplasm of kidney: Secondary | ICD-10-CM

## 2015-08-10 LAB — COMPREHENSIVE METABOLIC PANEL (CC13)
ALT: 21 U/L (ref 0–55)
ANION GAP: 8 meq/L (ref 3–11)
AST: 41 U/L — ABNORMAL HIGH (ref 5–34)
Albumin: 3.5 g/dL (ref 3.5–5.0)
Alkaline Phosphatase: 116 U/L (ref 40–150)
BUN: 17 mg/dL (ref 7.0–26.0)
CALCIUM: 9.6 mg/dL (ref 8.4–10.4)
CHLORIDE: 105 meq/L (ref 98–109)
CO2: 27 mEq/L (ref 22–29)
Creatinine: 1.7 mg/dL — ABNORMAL HIGH (ref 0.7–1.3)
EGFR: 41 mL/min/{1.73_m2} — AB (ref >90)
Glucose: 119 mg/dl (ref 70–140)
POTASSIUM: 4.2 meq/L (ref 3.5–5.1)
Sodium: 141 mEq/L (ref 136–145)
Total Bilirubin: 1.16 mg/dL (ref 0.20–1.20)
Total Protein: 7.2 g/dL (ref 6.4–8.3)

## 2015-08-10 LAB — CBC WITH DIFFERENTIAL/PLATELET
BASO%: 0.7 % (ref 0.0–2.0)
BASOS ABS: 0 10*3/uL (ref 0.0–0.1)
EOS%: 1.2 % (ref 0.0–7.0)
Eosinophils Absolute: 0 10*3/uL (ref 0.0–0.5)
HCT: 29.7 % — ABNORMAL LOW (ref 38.4–49.9)
HGB: 9.7 g/dL — ABNORMAL LOW (ref 13.0–17.1)
LYMPH%: 10.4 % — AB (ref 14.0–49.0)
MCH: 34.1 pg — AB (ref 27.2–33.4)
MCHC: 32.8 g/dL (ref 32.0–36.0)
MCV: 104.1 fL — ABNORMAL HIGH (ref 79.3–98.0)
MONO#: 0.2 10*3/uL (ref 0.1–0.9)
MONO%: 7.5 % (ref 0.0–14.0)
NEUT#: 2.5 10*3/uL (ref 1.5–6.5)
NEUT%: 80.2 % — AB (ref 39.0–75.0)
PLATELETS: 53 10*3/uL — AB (ref 140–400)
RBC: 2.86 10*6/uL — AB (ref 4.20–5.82)
RDW: 21.8 % — ABNORMAL HIGH (ref 11.0–14.6)
WBC: 3.2 10*3/uL — ABNORMAL LOW (ref 4.0–10.3)
lymph#: 0.3 10*3/uL — ABNORMAL LOW (ref 0.9–3.3)

## 2015-08-10 NOTE — Telephone Encounter (Signed)
Gave and printed appt sched and avs fo rpt for DEc...gv barium

## 2015-08-10 NOTE — Progress Notes (Signed)
Hematology and Oncology Follow Up Visit  Jeffrey [name]:[id number] 10-21-1950     Principle Diagnosis: 64 year old with the following issues:  1. T1b right kidney cancer diagnosed in 2010. He is S/P rdical nephrectomy on June 27, 2009. The pathological staging was T1b clear cell histology with Fuhrman grade 3/4.  2. Left Kidney mass  Measuring 4.5 x 2 7 x 3.6 irregular enhancing appears to be a new kidney cancer noted in 07/2012. 3. Pancreatic lesion. 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  Current therapy: Votrient  800 mg daily started in December 2013. Treatment held in 11/2012 due to increase LFTs. Votrient was resumed in April 2014 at 400 mg daily. His dose was reduced further to 200 mg daily starting in September 2016.  Interim History: Jeffrey Trujillo presents for a follow up visit. Since his last visit, he reports no major complaints. He did sustain a fall from his recliner and scraped his forehead but did not require any further intervention. He denied any syncope or seizures. He feels that he had tripped temporarily and that caused his fall. He did not have substantial bleeding at that time.    He has tolerated the current dose of Votrient without any new complications. He has not reported any fatigue, diarrhea or worsening in his performance status. He continues to work half days multiple days a week.   He is not having any fevers or chills. He denies chest pain and shortness of breath. No bleeding. He did not report any headaches blurred vision double vision. Does not report any motor sensory neuropathy or alteration of mental status. He does not report any depression or anxiety. He does not report any frequency urgency or hesitancy. He does not report any other urinary symptoms.   Does not report any lymphadenopathy or any constitutional symptoms. He does report some difficulty sleeping. Remainder of his review of systems is unremarkable.  Medications: Unchanged  by my review today. Current Outpatient Prescriptions  Medication Sig Dispense Refill  . indomethacin (INDOCIN) 50 MG capsule Take 50 mg by mouth as directed.    . pantoprazole (PROTONIX) 40 MG tablet TAKE (1) TABLET BY MOUTH TWICE A DAY BEFORE MEALS. (BREAKFAST AND SUPPER) 60 tablet 5  . Pazopanib HCl (VOTRIENT PO) Take 200 mg by mouth daily.     Marland Kitchen topiramate (TOPAMAX) 25 MG tablet Take 25 mg by mouth 2 (two) times daily.     No current facility-administered medications for this visit.     Allergies:  Allergies  Allergen Reactions  . Nsaids Other (See Comments)    Patient only has one kidney.   . Meperidine Hcl Hives and Rash    Past Medical History, Surgical history, Social history, and Family History were reviewed and updated.   Physical Exam: Blood pressure 159/85, pulse 82, temperature 97.5 F (36.4 C), temperature source Oral, resp. rate 18, height 5\' 11"  (1.803 m), weight 171 lb 6.4 oz (77.747 kg), SpO2 100 %. ECOG: 1 General appearance: alert and awake  well-appearing gentleman without distress today. Head: Normocephalic, no obvious trauma or induration noted. Slight erythema noted over his right eye. No active bleeding. Neck: no adenopathy, no masses Lymph nodes: Cervical, supraclavicular, and axillary nodes normal. Heart:regular rate and rhythm, S1, S2.  Lung:chest clear, no wheezing, or dullness to percussion. Abdomen: soft, non-tender, without masses or organomegaly. No rebound or guarding. EXT:no erythema, induration, or nodules. No edema. Neurological exam: no motor or sensory deficits noted. Skin: No  rashes or lesions noted. no petechiae or ecchymosis.  Neurological: Intact no deficits. He is able to ambulate without any difficulties.   CBC    Component Value Date/Time   WBC 3.2* 08/10/2015 1337   WBC 4.3 02/15/2015 0624   RBC 2.86* 08/10/2015 1337   RBC 3.54* 02/15/2015 0624   HGB 9.7* 08/10/2015 1337   HGB 10.7* 02/15/2015 0624   HCT 29.7* 08/10/2015  1337   HCT 34.0* 02/15/2015 0624   PLT 53* 08/10/2015 1337   PLT 232 02/15/2015 0624   MCV 104.1* 08/10/2015 1337   MCV 96.0 02/15/2015 0624   MCH 34.1* 08/10/2015 1337   MCH 30.2 02/15/2015 0624   MCHC 32.8 08/10/2015 1337   MCHC 31.5 02/15/2015 0624   RDW 21.8* 08/10/2015 1337   RDW 18.6* 02/15/2015 0624   LYMPHSABS 0.3* 08/10/2015 1337   LYMPHSABS 0.3* 02/11/2015 1435   MONOABS 0.2 08/10/2015 1337   MONOABS 0.3 02/11/2015 1435   EOSABS 0.0 08/10/2015 1337   EOSABS 0.0 02/11/2015 1435   BASOSABS 0.0 08/10/2015 1337   BASOSABS 0.0 02/11/2015 1435     Impression and Plan:   A 64 year old gentleman with the following issues:   1. A history of renal cell carcinoma. He had stage T1b diagnosed in October 2010 of the right kidney, status post right nephrectomy. He also has left kidney cancer and pancreatic metastasis. He was initially started on Votrient in December 2013. He had dose reduction on few occasions. The patient currently on Votrient 200 mg daily since September 2016 dose reduction.  CT scan obtained on 05/26/2015 showed  stable disease at this time without any evidence of progression or relapse. His pancreatic lesion is not detectable and his kidney tumor is about the same size. No new lesions were noted at this time.  The plan is to continue Votrient for 1 more month and repeat imaging studies at the end of December 2016. If he developed progression of disease, different agent will be used at that time. Continues to have stable disease at the current dose, we will continue at this time.  2. Pancreatic lesion. Biopsy proven to be malignant and likely represent renal cell cancer. Regressed on previous scans.  3. Thrombocytopenia:  Likely related to Votrient. His platelet count has dropped again without bleeding. We'll continue to monitor closely and educate him about signs symptoms of bleeding.  4. Anemia.  Hemoglobin is stable at this time and asymptomatic.  5.  Increase LFTs:  related to Votrient his current transaminases are back within normal range.  6. Weight loss: his weight has been better since the last visit with the dose reduction. We'll continue to monitor closely.  7. Recent fall and trauma: No major damage has been sustained. No evidence of syncope or neurological deficits.  8. Follow-up:  4-5 weeks for a repeat scan and a follow-up after that.  [name] MD 08/10/2015

## 2015-09-13 ENCOUNTER — Ambulatory Visit (HOSPITAL_COMMUNITY)
Admission: RE | Admit: 2015-09-13 | Discharge: 2015-09-13 | Disposition: A | Discharge status: Home / Self Care | Payer: 59 | Source: Ambulatory Visit | Attending: Oncology | Admitting: Oncology

## 2015-09-13 ENCOUNTER — Encounter (HOSPITAL_COMMUNITY): Payer: Self-pay

## 2015-09-13 ENCOUNTER — Ambulatory Visit (HOSPITAL_COMMUNITY): Payer: 59

## 2015-09-13 ENCOUNTER — Other Ambulatory Visit (HOSPITAL_BASED_OUTPATIENT_CLINIC_OR_DEPARTMENT_OTHER): Payer: 59

## 2015-09-13 DIAGNOSIS — K5730 Diverticulosis of large intestine without perforation or abscess without bleeding: Secondary | ICD-10-CM | POA: Insufficient documentation

## 2015-09-13 DIAGNOSIS — C649 Malignant neoplasm of unspecified kidney, except renal pelvis: Secondary | ICD-10-CM

## 2015-09-13 DIAGNOSIS — C641 Malignant neoplasm of right kidney, except renal pelvis: Secondary | ICD-10-CM | POA: Insufficient documentation

## 2015-09-13 DIAGNOSIS — N2889 Other specified disorders of kidney and ureter: Secondary | ICD-10-CM | POA: Insufficient documentation

## 2015-09-13 DIAGNOSIS — K760 Fatty (change of) liver, not elsewhere classified: Secondary | ICD-10-CM | POA: Insufficient documentation

## 2015-09-13 DIAGNOSIS — N400 Enlarged prostate without lower urinary tract symptoms: Secondary | ICD-10-CM | POA: Diagnosis not present

## 2015-09-13 DIAGNOSIS — C642 Malignant neoplasm of left kidney, except renal pelvis: Secondary | ICD-10-CM | POA: Diagnosis not present

## 2015-09-13 DIAGNOSIS — Z905 Acquired absence of kidney: Secondary | ICD-10-CM | POA: Diagnosis not present

## 2015-09-13 DIAGNOSIS — E279 Disorder of adrenal gland, unspecified: Secondary | ICD-10-CM | POA: Insufficient documentation

## 2015-09-13 DIAGNOSIS — R918 Other nonspecific abnormal finding of lung field: Secondary | ICD-10-CM | POA: Diagnosis not present

## 2015-09-13 LAB — CBC WITH DIFFERENTIAL/PLATELET
BASO%: 1.4 % (ref 0.0–2.0)
BASOS ABS: 0.1 10*3/uL (ref 0.0–0.1)
EOS ABS: 0 10*3/uL (ref 0.0–0.5)
EOS%: 0.9 % (ref 0.0–7.0)
HCT: 28.5 % — ABNORMAL LOW (ref 38.4–49.9)
HGB: 9.5 g/dL — ABNORMAL LOW (ref 13.0–17.1)
LYMPH%: 12.7 % — AB (ref 14.0–49.0)
MCH: 34.5 pg — AB (ref 27.2–33.4)
MCHC: 33.5 g/dL (ref 32.0–36.0)
MCV: 103 fL — AB (ref 79.3–98.0)
MONO#: 0.3 10*3/uL (ref 0.1–0.9)
MONO%: 8.3 % (ref 0.0–14.0)
NEUT#: 2.8 10*3/uL (ref 1.5–6.5)
NEUT%: 76.7 % — AB (ref 39.0–75.0)
PLATELETS: 181 10*3/uL (ref 140–400)
RBC: 2.77 10*6/uL — ABNORMAL LOW (ref 4.20–5.82)
RDW: 20.4 % — ABNORMAL HIGH (ref 11.0–14.6)
WBC: 3.6 10*3/uL — ABNORMAL LOW (ref 4.0–10.3)
lymph#: 0.5 10*3/uL — ABNORMAL LOW (ref 0.9–3.3)

## 2015-09-13 LAB — COMPREHENSIVE METABOLIC PANEL
ALBUMIN: 3.2 g/dL — AB (ref 3.5–5.0)
ALT: 30 U/L (ref 0–55)
ANION GAP: 10 meq/L (ref 3–11)
AST: 37 U/L — ABNORMAL HIGH (ref 5–34)
Alkaline Phosphatase: 112 U/L (ref 40–150)
BILIRUBIN TOTAL: 0.95 mg/dL (ref 0.20–1.20)
BUN: 18.4 mg/dL (ref 7.0–26.0)
CALCIUM: 9.5 mg/dL (ref 8.4–10.4)
CO2: 23 mEq/L (ref 22–29)
Chloride: 109 mEq/L (ref 98–109)
Creatinine: 1.4 mg/dL — ABNORMAL HIGH (ref 0.7–1.3)
EGFR: 51 mL/min/{1.73_m2} — AB (ref >90)
Glucose: 89 mg/dl (ref 70–140)
POTASSIUM: 4.1 meq/L (ref 3.5–5.1)
Sodium: 142 mEq/L (ref 136–145)
TOTAL PROTEIN: 7.6 g/dL (ref 6.4–8.3)

## 2015-09-16 ENCOUNTER — Telehealth: Payer: Self-pay | Admitting: Oncology

## 2015-09-16 ENCOUNTER — Ambulatory Visit (HOSPITAL_BASED_OUTPATIENT_CLINIC_OR_DEPARTMENT_OTHER): Payer: 59 | Admitting: Oncology

## 2015-09-16 ENCOUNTER — Ambulatory Visit (HOSPITAL_COMMUNITY)
Admission: RE | Admit: 2015-09-16 | Discharge: 2015-09-16 | Disposition: A | Discharge status: Home / Self Care | Payer: 59 | Source: Ambulatory Visit | Attending: Oncology | Admitting: Oncology

## 2015-09-16 VITALS — BP 167/78 | HR 77 | Temp 97.8°F | Resp 18 | Ht 71.0 in | Wt 174.6 lb

## 2015-09-16 DIAGNOSIS — C642 Malignant neoplasm of left kidney, except renal pelvis: Secondary | ICD-10-CM | POA: Diagnosis not present

## 2015-09-16 DIAGNOSIS — M7989 Other specified soft tissue disorders: Secondary | ICD-10-CM | POA: Diagnosis not present

## 2015-09-16 DIAGNOSIS — C7889 Secondary malignant neoplasm of other digestive organs: Secondary | ICD-10-CM | POA: Diagnosis not present

## 2015-09-16 DIAGNOSIS — R945 Abnormal results of liver function studies: Secondary | ICD-10-CM

## 2015-09-16 DIAGNOSIS — D696 Thrombocytopenia, unspecified: Secondary | ICD-10-CM

## 2015-09-16 DIAGNOSIS — C641 Malignant neoplasm of right kidney, except renal pelvis: Secondary | ICD-10-CM

## 2015-09-16 DIAGNOSIS — D649 Anemia, unspecified: Secondary | ICD-10-CM

## 2015-09-16 DIAGNOSIS — C649 Malignant neoplasm of unspecified kidney, except renal pelvis: Secondary | ICD-10-CM

## 2015-09-16 NOTE — Progress Notes (Signed)
VASCULAR LAB PRELIMINARY  PRELIMINARY  PRELIMINARY  PRELIMINARY  Left lower extremity venous duplex completed.    Preliminary report:  Left:  No evidence of DVT, superficial thrombosis, or Baker's cyst.  Jaleel Allen, RVT 09/16/2015, 12:12 PM

## 2015-09-16 NOTE — Telephone Encounter (Signed)
Schedule patient per pof, avs report printed.

## 2015-09-16 NOTE — Progress Notes (Signed)
Hematology and Oncology Follow Up Visit  Jeffrey Trujillo:[id number] [birth year]     Principle Diagnosis: 64 year old with the following issues:  1. T1b right kidney cancer diagnosed in 2010. He is S/P rdical nephrectomy on June 27, 2009. The pathological staging was T1b clear cell histology with Fuhrman grade 3/4.  2. Left Kidney mass  Measuring 4.5 x 2 7 x 3.6 irregular enhancing appears to be a new kidney cancer noted in 07/2012. 3. Pancreatic lesion. 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  Current therapy: Votrient  800 mg daily started in December 2013. Treatment held in 11/2012 due to increase LFTs. Votrient was resumed in April 2014 at 400 mg daily. His dose was reduced further to 200 mg daily starting in September 2016.  Interim History: [name] presents for a follow up visit. Since his last visit, he reports doing fairly well without any major complaints. He did notice slight increase in his left lower extremity swelling. His predominantly in the ankle and foot. He did have a gout in the past but no pain in noted in his lower extremity. Otherwise he has no other complaints. Has not reported any recent hospitalization or illnesses. His appetite is excellent without any decline in his health.   He has tolerated the current dose of Votrient without any new complications. He has not reported any fatigue, diarrhea or worsening in his performance status. He continues to work half days multiple days a week. He has not reported any recent bleeding complications.   He is not having any fevers or chills. He denies chest pain and shortness of breath. No bleeding. He did not report any headaches blurred vision double vision. Does not report any motor sensory neuropathy or alteration of mental status. He does not report any depression or anxiety. He does not report any frequency urgency or hesitancy. He does not report any other urinary symptoms.   Does not report any  lymphadenopathy or any constitutional symptoms. He does report some difficulty sleeping. Remainder of his review of systems is unremarkable.  Medications: Unchanged by my review today. Current Outpatient Prescriptions  Medication Sig Dispense Refill  . indomethacin (INDOCIN) 50 MG capsule Take 50 mg by mouth as directed.    . pantoprazole (PROTONIX) 40 MG tablet TAKE (1) TABLET BY MOUTH TWICE A DAY BEFORE MEALS. (BREAKFAST AND SUPPER) 60 tablet 5  . Pazopanib HCl (VOTRIENT PO) Take 200 mg by mouth daily.     Marland Kitchen topiramate (TOPAMAX) 25 MG tablet Take 25 mg by mouth 2 (two) times daily.     No current facility-administered medications for this visit.     Allergies:  Allergies  Allergen Reactions  . Nsaids Other (See Comments)    Patient only has one kidney.   . Meperidine Hcl Hives and Rash    Past Medical History, Surgical history, Social history, and Family History were reviewed and updated.   Physical Exam: Blood pressure 167/78, pulse 77, temperature 97.8 F (36.6 C), temperature source Oral, resp. rate 18, height 5\' 11"  (1.803 m), weight 174 lb 9.6 oz (79.198 kg), SpO2 100 %. ECOG: 1 General appearance: alert and awake without distress. Head: Normocephalic, no obvious trauma or induration noted. No oral ulcers or lesions. Neck: no adenopathy, no masses Lymph nodes: Cervical, supraclavicular, and axillary nodes normal. Heart:regular rate and rhythm, S1, S2.  Lung:chest clear, no wheezing, or dullness to percussion. Abdomen: soft, non-tender, without masses or organomegaly. No rebound or guarding. EXT: Left  lower leg edema noted around 1+ at the level of the ankle. Neurological exam: no motor or sensory deficits noted. Skin: No rashes or lesions noted. no petechiae or ecchymosis.  Neurological: Intact no deficits. No changes in his mentation or gait.  CBC    Component Value Date/Time   WBC 3.6* 09/13/2015 1401   WBC 4.3 02/15/2015 0624   RBC 2.77* 09/13/2015 1401   RBC  3.54* 02/15/2015 0624   HGB 9.5* 09/13/2015 1401   HGB 10.7* 02/15/2015 0624   HCT 28.5* 09/13/2015 1401   HCT 34.0* 02/15/2015 0624   PLT 181 09/13/2015 1401   PLT 232 02/15/2015 0624   MCV 103.0* 09/13/2015 1401   MCV 96.0 02/15/2015 0624   MCH 34.5* 09/13/2015 1401   MCH 30.2 02/15/2015 0624   MCHC 33.5 09/13/2015 1401   MCHC 31.5 02/15/2015 0624   RDW 20.4* 09/13/2015 1401   RDW 18.6* 02/15/2015 0624   LYMPHSABS 0.5* 09/13/2015 1401   LYMPHSABS 0.3* 02/11/2015 1435   MONOABS 0.3 09/13/2015 1401   MONOABS 0.3 02/11/2015 1435   EOSABS 0.0 09/13/2015 1401   EOSABS 0.0 02/11/2015 1435   BASOSABS 0.1 09/13/2015 1401   BASOSABS 0.0 02/11/2015 1435   EXAM: CT CHEST, ABDOMEN AND PELVIS WITHOUT CONTRAST  TECHNIQUE: Multidetector CT imaging of the chest, abdomen and pelvis was performed following the standard protocol without IV contrast.  COMPARISON: 05/26/2015 CT chest, abdomen and pelvis.  FINDINGS: CT CHEST  Mediastinum/Nodes: Normal heart size. No pericardial fluid/thickening. Great vessels are normal in course and caliber. Normal visualized thyroid. Normal esophagus. No pathologically enlarged axillary, mediastinal or gross hilar lymph nodes, noting limited sensitivity for the detection of hilar adenopathy on this noncontrast study.  Lungs/Pleura: No pneumothorax. No pleural effusion. Posterior right middle lobe 4 mm pulmonary nodule associated with the right major fissure (series 4/ image 37) appears stable since 04/15/2007 and benign. Superior 4 mm right middle lobe pulmonary nodule associated with the minor fissure (series 4/ image 35) is not appreciably changed since 04/15/2007 and benign. Subpleural 3 mm anterior left lower lobe pulmonary nodule associated with the left major fissure (series 4/ image 22) is stable since 04/15/2007 and benign. There is stable postsurgical changes from left lower lobe wedge resection with subpleural scarring in the  medial basilar left lower lobe, not appreciably changed since at least 07/24/2013. No acute consolidative airspace disease, new significant pulmonary nodules or lung masses.  Musculoskeletal: No aggressive appearing focal osseous lesions. Stable post thoracotomy changes in the left sixth rib.  CT ABDOMEN AND PELVIS  Hepatobiliary: Diffuse hepatic steatosis. Solitary subcentimeter hypodense left liver lobe lesion (series 2/ image 54) is too small to characterize and stable since at least 07/24/2013 and probably benign. No new liver lesions. Normal gallbladder with no radiopaque cholelithiasis. No biliary ductal dilatation.  Pancreas: Normal, with no mass or duct dilation.  Spleen: Normal size. No mass.  Adrenals/Urinary Tract: Normal right adrenal. There is a 1.0 x 1.0 cm left adrenal nodule (series 2/ image 63) with indeterminate CT density of 29 HU, not appreciably changed in size since 05/26/2015, however which was not present on the more remote 07/24/2013 CT study. Stable postsurgical changes status post right nephrectomy, with no mass in the right nephrectomy bed. Stable exophytic 3.6 cm simple renal cyst in the medial upper left kidney. There is a solid 5.7 x 5.0 cm renal mass in the anterior mid to upper left kidney (series 2/ image 66), previously 5.6 x 5.0 cm on 05/26/2015 using  similar measurement technique, not appreciably changed, although increased from 5.2 x 4.3 cm on 02/11/15. No left hydronephrosis. No new contour deforming left renal mass. Collapsed and grossly normal bladder.  Stomach/Bowel: Grossly normal stomach. Normal caliber small bowel with no small bowel wall thickening. Normal appendix. Mild sigmoid diverticulosis, with no large bowel wall thickening or pericolonic fat stranding.  Vascular/Lymphatic: Normal caliber abdominal aorta. No pathologically enlarged lymph nodes in the abdomen or pelvis.  Reproductive: Stable mild prostatomegaly with  nonspecific internal prostatic calcification.  Other: No pneumoperitoneum, ascites or focal fluid collection.  Musculoskeletal: No aggressive appearing focal osseous lesions. Mild-to-moderate degenerative changes in the lumbar spine, most prominent at L5-S1.  IMPRESSION: 1. No appreciable interval change in the solid 5.7 cm renal mass in the anterior mid to upper left kidney, most in keeping with a renal cell carcinoma. 2. Stable appearance status post right nephrectomy, with no evidence of local tumor recurrence in the right nephrectomy bed. 3. Interval stability of an indeterminate 1.0 cm left adrenal nodule, which was not apparent on the more remote 07/24/2013 CT study. Left adrenal metastasis not excluded. If clinically desired, this could be further evaluated with MRI abdomen with and without intravenous contrast (if renal function is normal) or MRI abdomen without intravenous contrast (if renal function is impaired). Alternatively, the left adrenal nodule could be evaluated with PET-CT if clinically warranted. 4. Otherwise no potential sites of metastatic disease in the chest, abdomen or pelvis. 5. Chronic findings including diffuse hepatic steatosis, mild sigmoid diverticulosis and mild prostatomegaly.   Impression and Plan:   A 64 year old gentleman with the following issues:   1. A history of renal cell carcinoma. He had stage T1b diagnosed in October 2010 of the right kidney, status post right nephrectomy. He also has left kidney cancer and pancreatic metastasis. He was initially started on Votrient in December 2013. He had dose reduction on few occasions. The patient currently on Votrient 200 mg daily since September 2016 dose reduction.  CT scan obtained on 09/13/2015 was reviewed and discussed with the patient today. This showed  stable disease at this time without any evidence of progression or relapse. His pancreatic lesion is not detectable and his kidney tumor  is about the same size. No new lesions were noted at this time.  The plan is to continue Votrient for at the current dose and schedule and repeat imaging studies in 3 months. Different salvage agent will be used if he develops progression of disease.  2. Pancreatic lesion. Biopsy proven to be malignant and likely represent renal cell cancer. He is no longer detected.  3. Thrombocytopenia:  Likely related to Votrient. His platelet count is back to normal range.  4. Anemia.  Hemoglobin is stable at this time and asymptomatic.  5. Increase LFTs:  related to Votrient his current transaminases are back within normal range. No changes since the last visit.  6. Weight loss: his weight has been better since the last visit with the dose reduction. We will continue to monitor this moving forward.  7. Left lower extremity edema: We will obtain an Doppler ultrasound to rule out DVT. I talked to him about the need for possible anticoagulation and he is agreeable if that is needed.  8. Follow-up:  4-5 weeks for evaluation.  [name] MD 09/16/2015

## 2015-10-06 ENCOUNTER — Other Ambulatory Visit: Payer: Self-pay | Admitting: *Deleted

## 2015-10-06 ENCOUNTER — Encounter: Payer: Self-pay | Admitting: *Deleted

## 2015-10-06 DIAGNOSIS — C649 Malignant neoplasm of unspecified kidney, except renal pelvis: Secondary | ICD-10-CM

## 2015-10-06 MED ORDER — PAZOPANIB HCL 200 MG PO TABS: ORAL_TABLET | ORAL | Status: DC

## 2015-10-14 ENCOUNTER — Telehealth: Payer: Self-pay | Admitting: Oncology

## 2015-10-14 ENCOUNTER — Ambulatory Visit: Payer: 59 | Admitting: Oncology

## 2015-10-14 ENCOUNTER — Other Ambulatory Visit: Payer: 59

## 2015-10-14 NOTE — Telephone Encounter (Signed)
pt spouse cld wanting to r/s appt-she req a Friday gave r/s first available Friday appt time & date

## 2015-11-09 ENCOUNTER — Emergency Department (HOSPITAL_COMMUNITY): Payer: BLUE CROSS/BLUE SHIELD

## 2015-11-09 ENCOUNTER — Encounter (HOSPITAL_COMMUNITY): Payer: Self-pay | Admitting: *Deleted

## 2015-11-09 ENCOUNTER — Emergency Department (HOSPITAL_COMMUNITY)
Admission: EM | Admit: 2015-11-09 | Discharge: 2015-11-09 | Disposition: A | Discharge status: Home / Self Care | Payer: BLUE CROSS/BLUE SHIELD | Attending: Emergency Medicine | Admitting: Emergency Medicine

## 2015-11-09 DIAGNOSIS — Z85528 Personal history of other malignant neoplasm of kidney: Secondary | ICD-10-CM | POA: Insufficient documentation

## 2015-11-09 DIAGNOSIS — M545 Low back pain, unspecified: Secondary | ICD-10-CM

## 2015-11-09 DIAGNOSIS — Z862 Personal history of diseases of the blood and blood-forming organs and certain disorders involving the immune mechanism: Secondary | ICD-10-CM | POA: Insufficient documentation

## 2015-11-09 DIAGNOSIS — M25521 Pain in right elbow: Secondary | ICD-10-CM | POA: Insufficient documentation

## 2015-11-09 DIAGNOSIS — Z79899 Other long term (current) drug therapy: Secondary | ICD-10-CM | POA: Insufficient documentation

## 2015-11-09 DIAGNOSIS — Z880 Allergy status to penicillin: Secondary | ICD-10-CM | POA: Insufficient documentation

## 2015-11-09 DIAGNOSIS — M109 Gout, unspecified: Secondary | ICD-10-CM | POA: Diagnosis not present

## 2015-11-09 DIAGNOSIS — K219 Gastro-esophageal reflux disease without esophagitis: Secondary | ICD-10-CM | POA: Insufficient documentation

## 2015-11-09 DIAGNOSIS — Z87891 Personal history of nicotine dependence: Secondary | ICD-10-CM | POA: Insufficient documentation

## 2015-11-09 MED ORDER — HYDROGEN PEROXIDE 3 % EX SOLN
CUTANEOUS | Status: AC
  Filled 2015-11-09: qty 473

## 2015-11-09 MED ORDER — PREDNISONE 50 MG PO TABS: ORAL_TABLET | ORAL | Status: DC

## 2015-11-09 MED ORDER — OXYCODONE-ACETAMINOPHEN 5-325 MG PO TABS: 1.0000 | ORAL_TABLET | ORAL | Status: DC | PRN

## 2015-11-09 MED ORDER — HYDROMORPHONE HCL 1 MG/ML IJ SOLN
1.0000 mg | Freq: Once | INTRAMUSCULAR | Status: AC
  Administered 2015-11-09: 1 mg via INTRAMUSCULAR
  Filled 2015-11-09: qty 1

## 2015-11-09 NOTE — Discharge Instructions (Signed)
Medication for pain and prednisone. Follow-up your primary care doctor. Ice pack to lower back.

## 2015-11-09 NOTE — ED Notes (Signed)
Pt comes in by EMS for leg pain, arm pain, and back pain. Per EMS pt has history of gout and states that this feels the same and how he previously presented.

## 2015-11-09 NOTE — ED Provider Notes (Signed)
CSN: [id number]     Arrival date & time 11/09/15  [id number] History  By signing my name below, I, Jeffrey Trujillo, attest that this documentation has been prepared under the direction and in the presence of [name], MD. Electronically Signed: Judithann Sauger, ED Scribe. 11/09/2015. 9:19 AM.    Chief Complaint  Patient presents with  . Back Pain   The history is provided by the patient. No language interpreter was used.   HPI Comments: Jeffrey Trujillo is a 65 y.o. male who presents to the Emergency Department complaining of gradually worsening moderate right elbow and non-radiating lower back pain onset several days ago. He states that he had a gout flare up one week ago. He has tried indomethacin and tylenol for these symptoms with minimal relief. Pt is currently being treated for Cancer at [facility]. No fever or chills.    Past Medical History  Diagnosis Date  . Gout   . DJD (degenerative joint disease) of cervical spine   . Diverticulosis of colon     by colonoscopy 11/02/2009  . Barrett's esophagus     by EGD 11/02/2009  . Anemia   . Cancer [facility])     "was told has cancer on other kidney and spot on pancreas"  . 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 GERD (gastroesophageal reflux disease)    Past Surgical History  Procedure Laterality Date  . Nephrectomy      right due to cancer  . Lung removal, partial      left lower lobe, benign  . Eus  08/07/2012    Procedure: UPPER ENDOSCOPIC ULTRASOUND (EUS) LINEAR;  Surgeon: [name], MD;  Location: [facility];  Service: Endoscopy;  Laterality: N/A;  . Inguinal hernia repair    . Hernia repair    . Inguinal hernia repair      Right  . Eus  08/21/2012    Procedure: UPPER ENDOSCOPIC ULTRASOUND (EUS) LINEAR;  Surgeon: [name], MD;  Location: [facility];  Service: Endoscopy;  Laterality: N/A;  . Colonoscopy  Feb 2011    [name]: single anal papilla, pan-colonic diverticula  .  Esophagogastroduodenoscopy  Feb 2011    [name]: salmon-colored epithelium consistent with Barrett's, nodular antral erosions, small hiatal hernia, negative H.pylori  . Colonoscopy  09/25/2012    RMR: Colonic diverticulosis. Suspect diverticular bleeding-hepatic flexure tic-sealed  . Esophagogastroduodenoscopy  09/25/2012    [id number] esophagus. Small hiatal hernia. Antral erosions of doubtful clinical significance, no biopsies  . Esophagogastroduodenoscopy N/A 03/11/2013    SLF: Barrett's esophagus/Single ulcer in the gastric antrum-PROBABLE SOURCE FOR BLACK STOOL/ Chronic gastritis in the gastric antrum  . Esophagogastroduodenoscopy N/A 10/11/2014    Dr. [name] barrett's s/p biopsy. Hiatal hernia. Biopsy with intestinal metaplasia, glandular atypia indefinite for low grade dysplasia noted as well  . Esophagogastroduodenoscopy N/A 01/19/2015    Procedure: ESOPHAGOGASTRODUODENOSCOPY (EGD);  Surgeon: [name], MD;  [facility];  Service: Endoscopy;  Laterality: N/A;  200   Family History  Problem Relation Age of Onset  . Lung cancer Brother   . Prostate cancer Brother   . Colon cancer Neg Hx   . Lumbar disc disease Brother   . Prostate cancer Brother    Social History  Substance Use Topics  . Smoking status: Former Smoker -- 0.25 packs/day for 4 years    Types: Cigars  . Smokeless tobacco: Never Used     Comment: occasional  cigar  . Alcohol Use: 1.2 - 1.8 oz/week    2-3 Cans of beer per week     Comment: vodka and budweiser, sometimes daily, depends on notion. couple drinks. 2-3 beers on a weekend.    Review of Systems  Constitutional: Negative for fever and chills.  Musculoskeletal: Positive for back pain and arthralgias (left elbow).      Allergies  Amoxicillin; Nsaids; Demerol; and Meperidine hcl  Home Medications   Prior to Admission medications   Medication Sig Start Date End Date Taking? Authorizing Provider  indomethacin (INDOCIN) 50 MG  capsule Take 50 mg by mouth 3 (three) times daily.  07/05/15  Yes Historical Provider, MD  pantoprazole (PROTONIX) 40 MG tablet TAKE (1) TABLET BY MOUTH TWICE A DAY BEFORE MEALS. (BREAKFAST AND SUPPER) 04/26/15  Yes Carlis Stable, NP  pazopanib (VOTRIENT) 200 MG tablet Take 2 tablets by mouth, daily on an empty stomach 10/06/15  Yes [name], MD  topiramate (TOPAMAX) 25 MG tablet Take 25 mg by mouth 2 (two) times daily.   Yes Historical Provider, MD  oxyCODONE-acetaminophen (PERCOCET) 5-325 MG tablet Take 1-2 tablets by mouth every 4 (four) hours as needed. 11/09/15   [name], MD  predniSONE (DELTASONE) 50 MG tablet 1 tablet for 6 days, one half tablet for 6 days 11/09/15   [name], MD   BP 159/80 mmHg  Pulse 98  Temp(Src) 98.1 F (36.7 C) (Oral)  Resp 24  Ht 5\' 11"  (1.803 m)  Wt 164 lb (74.39 kg)  BMI 22.88 kg/m2  SpO2 100% Physical Exam  Constitutional: He is oriented to person, place, and time. He appears well-developed and well-nourished.  HENT:  Head: Normocephalic and atraumatic.  Eyes: Conjunctivae and EOM are normal. Pupils are equal, round, and reactive to light.  Neck: Normal range of motion. Neck supple.  Cardiovascular: Normal rate and regular rhythm.   Pulmonary/Chest: Effort normal and breath sounds normal.  Abdominal: Soft. Bowel sounds are normal.  Musculoskeletal: Normal range of motion.  Tenderness in general lumbar spine Minimally tender in right elbow  Neurological: He is alert and oriented to person, place, and time.  Skin: Skin is warm and dry.  Psychiatric: He has a normal mood and affect. His behavior is normal.  Nursing note and vitals reviewed.   ED Course  Procedures (including critical care time) DIAGNOSTIC STUDIES: Oxygen Saturation is 100% on RA, normal by my interpretation.    COORDINATION OF CARE: 8:15 AM- Pt advised of plan for treatment and pt agrees. Pt will receive x-ray of lower back. He will also receive Dilaudid.   Imaging  Review Dg Lumbar Spine Complete  11/09/2015  CLINICAL DATA:  66 year old male with acute lumbar pain for 3 days with no known injury. Initial encounter. EXAM: LUMBAR SPINE - COMPLETE 4+ VIEW COMPARISON:  CT chest, Abdomen and Pelvis 09/13/2015 FINDINGS: Normal lumbar segmentation. Stable vertebral height and alignment, trace anterolisthesis at L4-L5. Preserved disc spaces except at L5-S1 were there is chronic disc space loss and vacuum disc. Moderate facet hypertrophy from the L4 level to the sacrum re- demonstrated, L4-L5 vacuum facet phenomena previously by CT. No pars fracture. Visible lower thoracic levels appear intact. Sacral ala and SI joints within normal limits. Stable pelvic phleboliths. Stable dystrophic calcifications about the right adrenal gland, and the right kidney is seen to be surgically or congenitally absent on the comparison. IMPRESSION: 1.  No acute osseous abnormality identified in lumbar spine. 2. Chronically advanced L5-S1 disc degeneration, and  L4-L5 facet degeneration. Electronically Signed   By: Genevie Ann M.D.   On: 11/09/2015 08:27   [name], MD has personally reviewed and evaluated these images as part of his medical decision-making.  MDM   Final diagnoses:  Midline low back pain without sciatica   No evidence of AAA.  Patient has known arthritis and gout issues. Plain films of lumbar spine showed no acute osseous abnormalities; however, there is advanced disc degeneration at L5-S1 and facet degeneration L4-L5.  Discharge medications Percocet and prednisone  I personally performed the services described in this documentation, which was scribed in my presence. The recorded information has been reviewed and is accurate.     [name], MD 11/09/15 [phone]

## 2015-11-12 ENCOUNTER — Inpatient Hospital Stay (HOSPITAL_COMMUNITY)
Admission: EM | Admit: 2015-11-12 | Discharge: 2015-11-18 | DRG: 345 | Disposition: A | Discharge status: Home Health | Payer: BLUE CROSS/BLUE SHIELD | Attending: Family Medicine | Admitting: Family Medicine

## 2015-11-12 ENCOUNTER — Emergency Department (HOSPITAL_COMMUNITY): Payer: BLUE CROSS/BLUE SHIELD

## 2015-11-12 ENCOUNTER — Inpatient Hospital Stay (HOSPITAL_COMMUNITY): Payer: BLUE CROSS/BLUE SHIELD

## 2015-11-12 ENCOUNTER — Encounter (HOSPITAL_COMMUNITY): Payer: Self-pay | Admitting: *Deleted

## 2015-11-12 DIAGNOSIS — D638 Anemia in other chronic diseases classified elsewhere: Secondary | ICD-10-CM | POA: Diagnosis present

## 2015-11-12 DIAGNOSIS — E876 Hypokalemia: Secondary | ICD-10-CM | POA: Diagnosis not present

## 2015-11-12 DIAGNOSIS — K5900 Constipation, unspecified: Secondary | ICD-10-CM | POA: Diagnosis not present

## 2015-11-12 DIAGNOSIS — K613 Ischiorectal abscess: Secondary | ICD-10-CM | POA: Diagnosis present

## 2015-11-12 DIAGNOSIS — Z95828 Presence of other vascular implants and grafts: Secondary | ICD-10-CM

## 2015-11-12 DIAGNOSIS — C7889 Secondary malignant neoplasm of other digestive organs: Secondary | ICD-10-CM | POA: Diagnosis present

## 2015-11-12 DIAGNOSIS — Z87891 Personal history of nicotine dependence: Secondary | ICD-10-CM | POA: Diagnosis not present

## 2015-11-12 DIAGNOSIS — M109 Gout, unspecified: Secondary | ICD-10-CM | POA: Diagnosis not present

## 2015-11-12 DIAGNOSIS — C649 Malignant neoplasm of unspecified kidney, except renal pelvis: Secondary | ICD-10-CM | POA: Diagnosis not present

## 2015-11-12 DIAGNOSIS — K611 Rectal abscess: Secondary | ICD-10-CM | POA: Diagnosis not present

## 2015-11-12 DIAGNOSIS — Z8042 Family history of malignant neoplasm of prostate: Secondary | ICD-10-CM | POA: Diagnosis not present

## 2015-11-12 DIAGNOSIS — K219 Gastro-esophageal reflux disease without esophagitis: Secondary | ICD-10-CM | POA: Diagnosis present

## 2015-11-12 DIAGNOSIS — A047 Enterocolitis due to Clostridium difficile: Secondary | ICD-10-CM | POA: Diagnosis present

## 2015-11-12 DIAGNOSIS — Z801 Family history of malignant neoplasm of trachea, bronchus and lung: Secondary | ICD-10-CM

## 2015-11-12 DIAGNOSIS — K615 Supralevator abscess: Secondary | ICD-10-CM | POA: Diagnosis present

## 2015-11-12 DIAGNOSIS — C641 Malignant neoplasm of right kidney, except renal pelvis: Secondary | ICD-10-CM | POA: Diagnosis present

## 2015-11-12 DIAGNOSIS — M545 Low back pain: Secondary | ICD-10-CM | POA: Diagnosis present

## 2015-11-12 DIAGNOSIS — Z905 Acquired absence of kidney: Secondary | ICD-10-CM | POA: Diagnosis not present

## 2015-11-12 LAB — COMPREHENSIVE METABOLIC PANEL
ALK PHOS: 100 U/L (ref 38–126)
ALT: 28 U/L (ref 17–63)
AST: 40 U/L (ref 15–41)
Albumin: 2.5 g/dL — ABNORMAL LOW (ref 3.5–5.0)
Anion gap: 10 (ref 5–15)
BILIRUBIN TOTAL: 0.7 mg/dL (ref 0.3–1.2)
BUN: 32 mg/dL — AB (ref 6–20)
CALCIUM: 8.8 mg/dL — AB (ref 8.9–10.3)
CO2: 22 mmol/L (ref 22–32)
CREATININE: 1.49 mg/dL — AB (ref 0.61–1.24)
Chloride: 103 mmol/L (ref 101–111)
GFR calc Af Amer: 55 mL/min — ABNORMAL LOW (ref >60)
GFR, EST NON AFRICAN AMERICAN: 48 mL/min — AB (ref >60)
Glucose, Bld: 79 mg/dL (ref 65–99)
POTASSIUM: 2.9 mmol/L — AB (ref 3.5–5.1)
Sodium: 135 mmol/L (ref 135–145)
TOTAL PROTEIN: 6.5 g/dL (ref 6.5–8.1)

## 2015-11-12 LAB — CBC WITH DIFFERENTIAL/PLATELET
BASOS PCT: 0 %
Basophils Absolute: 0 10*3/uL (ref 0.0–0.1)
Eosinophils Absolute: 0 10*3/uL (ref 0.0–0.7)
Eosinophils Relative: 0 %
HEMATOCRIT: 26.6 % — AB (ref 39.0–52.0)
HEMOGLOBIN: 8.8 g/dL — AB (ref 13.0–17.0)
LYMPHS PCT: 4 %
Lymphs Abs: 0.3 10*3/uL — ABNORMAL LOW (ref 0.7–4.0)
MCH: 35.8 pg — ABNORMAL HIGH (ref 26.0–34.0)
MCHC: 33.1 g/dL (ref 30.0–36.0)
MCV: 108.1 fL — AB (ref 78.0–100.0)
MONOS PCT: 4 %
Monocytes Absolute: 0.3 10*3/uL (ref 0.1–1.0)
NEUTROS ABS: 6.8 10*3/uL (ref 1.7–7.7)
NEUTROS PCT: 92 %
Platelets: 205 10*3/uL (ref 150–400)
RBC: 2.46 MIL/uL — ABNORMAL LOW (ref 4.22–5.81)
RDW: 21.7 % — ABNORMAL HIGH (ref 11.5–15.5)
WBC: 7.4 10*3/uL (ref 4.0–10.5)

## 2015-11-12 LAB — URINALYSIS, ROUTINE W REFLEX MICROSCOPIC
BILIRUBIN URINE: NEGATIVE
Glucose, UA: NEGATIVE mg/dL
Hgb urine dipstick: NEGATIVE
KETONES UR: NEGATIVE mg/dL
LEUKOCYTES UA: NEGATIVE
NITRITE: NEGATIVE
PROTEIN: NEGATIVE mg/dL
Specific Gravity, Urine: <1.005 — ABNORMAL LOW (ref 1.005–1.030)
pH: 5.5 (ref 5.0–8.0)

## 2015-11-12 LAB — PREPARE RBC (CROSSMATCH)

## 2015-11-12 LAB — PROTIME-INR
INR: 1.19 (ref 0.00–1.49)
Prothrombin Time: 15.3 seconds — ABNORMAL HIGH (ref 11.6–15.2)

## 2015-11-12 LAB — MAGNESIUM: MAGNESIUM: 1.5 mg/dL — AB (ref 1.7–2.4)

## 2015-11-12 LAB — LACTIC ACID, PLASMA: LACTIC ACID, VENOUS: 2.1 mmol/L — AB (ref 0.5–2.0)

## 2015-11-12 MED ORDER — POTASSIUM CHLORIDE CRYS ER 20 MEQ PO TBCR
40.0000 meq | EXTENDED_RELEASE_TABLET | Freq: Once | ORAL | Status: AC
  Administered 2015-11-12: 40 meq via ORAL
  Filled 2015-11-12: qty 2

## 2015-11-12 MED ORDER — ONDANSETRON HCL 4 MG/2ML IJ SOLN
4.0000 mg | Freq: Four times a day (QID) | INTRAMUSCULAR | Status: DC | PRN
  Filled 2015-11-12: qty 2

## 2015-11-12 MED ORDER — SENNOSIDES-DOCUSATE SODIUM 8.6-50 MG PO TABS: 1.0000 | ORAL_TABLET | Freq: Every evening | ORAL | Status: DC | PRN

## 2015-11-12 MED ORDER — VANCOMYCIN HCL IN DEXTROSE 750-5 MG/150ML-% IV SOLN
INTRAVENOUS | Status: AC
  Filled 2015-11-12: qty 150

## 2015-11-12 MED ORDER — ACETAMINOPHEN 650 MG RE SUPP: 650.0000 mg | Freq: Four times a day (QID) | RECTAL | Status: DC | PRN

## 2015-11-12 MED ORDER — TOPIRAMATE 25 MG PO TABS
25.0000 mg | ORAL_TABLET | Freq: Two times a day (BID) | ORAL | Status: DC
  Administered 2015-11-12 – 2015-11-18 (×12): 25 mg via ORAL
  Filled 2015-11-12 (×18): qty 1

## 2015-11-12 MED ORDER — POTASSIUM CHLORIDE 10 MEQ/100ML IV SOLN
INTRAVENOUS | Status: AC
  Filled 2015-11-12: qty 100

## 2015-11-12 MED ORDER — PIPERACILLIN-TAZOBACTAM 3.375 G IVPB
3.3750 g | Freq: Three times a day (TID) | INTRAVENOUS | Status: DC
  Administered 2015-11-12 – 2015-11-16 (×11): 3.375 g via INTRAVENOUS
  Filled 2015-11-12 (×12): qty 50

## 2015-11-12 MED ORDER — CIPROFLOXACIN IN D5W 400 MG/200ML IV SOLN
400.0000 mg | Freq: Once | INTRAVENOUS | Status: AC
  Administered 2015-11-12: 400 mg via INTRAVENOUS
  Filled 2015-11-12: qty 200

## 2015-11-12 MED ORDER — VANCOMYCIN HCL IN DEXTROSE 750-5 MG/150ML-% IV SOLN
750.0000 mg | Freq: Two times a day (BID) | INTRAVENOUS | Status: DC
  Administered 2015-11-13: 750 mg via INTRAVENOUS
  Filled 2015-11-12 (×3): qty 150

## 2015-11-12 MED ORDER — POTASSIUM CHLORIDE CRYS ER 20 MEQ PO TBCR
40.0000 meq | EXTENDED_RELEASE_TABLET | ORAL | Status: AC
  Administered 2015-11-12 – 2015-11-13 (×3): 40 meq via ORAL
  Filled 2015-11-12 (×3): qty 2

## 2015-11-12 MED ORDER — HEPARIN SODIUM (PORCINE) 5000 UNIT/ML IJ SOLN
5000.0000 [IU] | Freq: Three times a day (TID) | INTRAMUSCULAR | Status: DC
  Administered 2015-11-12: 5000 [IU] via SUBCUTANEOUS
  Filled 2015-11-12: qty 1

## 2015-11-12 MED ORDER — HYDROMORPHONE HCL 1 MG/ML IJ SOLN
1.0000 mg | Freq: Once | INTRAMUSCULAR | Status: AC
Start: 2015-11-12 — End: 2015-11-12
  Administered 2015-11-12: 1 mg via INTRAVENOUS
  Filled 2015-11-12: qty 1

## 2015-11-12 MED ORDER — POTASSIUM CHLORIDE 10 MEQ/100ML IV SOLN
10.0000 meq | INTRAVENOUS | Status: DC
  Administered 2015-11-12 (×3): 10 meq via INTRAVENOUS
  Filled 2015-11-12 (×3): qty 100

## 2015-11-12 MED ORDER — VANCOMYCIN HCL IN DEXTROSE 1-5 GM/200ML-% IV SOLN: 1000.0000 mg | Freq: Once | INTRAVENOUS | Status: DC

## 2015-11-12 MED ORDER — PROCHLORPERAZINE EDISYLATE 5 MG/ML IJ SOLN
5.0000 mg | Freq: Once | INTRAMUSCULAR | Status: AC
  Administered 2015-11-12: 5 mg via INTRAVENOUS
  Filled 2015-11-12: qty 2

## 2015-11-12 MED ORDER — SODIUM CHLORIDE 0.9 % IV SOLN
Freq: Once | INTRAVENOUS | Status: AC
  Administered 2015-11-12: 15:00:00 via INTRAVENOUS

## 2015-11-12 MED ORDER — SODIUM CHLORIDE 0.9 % IV SOLN
INTRAVENOUS | Status: DC
  Administered 2015-11-12: 75 mL/h via INTRAVENOUS
  Administered 2015-11-13 – 2015-11-16 (×6): via INTRAVENOUS

## 2015-11-12 MED ORDER — TOPIRAMATE 25 MG PO TABS
ORAL_TABLET | ORAL | Status: AC
  Filled 2015-11-12: qty 1

## 2015-11-12 MED ORDER — MORPHINE SULFATE (PF) 2 MG/ML IV SOLN
1.0000 mg | INTRAVENOUS | Status: DC | PRN
  Administered 2015-11-12 – 2015-11-18 (×13): 1 mg via INTRAVENOUS
  Filled 2015-11-12 (×13): qty 1

## 2015-11-12 MED ORDER — VANCOMYCIN HCL 10 G IV SOLR
1500.0000 mg | Freq: Once | INTRAVENOUS | Status: AC
  Administered 2015-11-12: 1500 mg via INTRAVENOUS
  Filled 2015-11-12: qty 1500

## 2015-11-12 MED ORDER — CHLORHEXIDINE GLUCONATE 4 % EX LIQD
1.0000 "application " | Freq: Once | CUTANEOUS | Status: AC
  Administered 2015-11-12: 1 via TOPICAL
  Filled 2015-11-12: qty 15

## 2015-11-12 MED ORDER — METRONIDAZOLE IN NACL 5-0.79 MG/ML-% IV SOLN
500.0000 mg | Freq: Once | INTRAVENOUS | Status: AC
  Administered 2015-11-12: 500 mg via INTRAVENOUS
  Filled 2015-11-12: qty 100

## 2015-11-12 MED ORDER — ONDANSETRON HCL 4 MG PO TABS: 4.0000 mg | ORAL_TABLET | Freq: Four times a day (QID) | ORAL | Status: DC | PRN

## 2015-11-12 MED ORDER — HYDROMORPHONE HCL 1 MG/ML IJ SOLN
1.0000 mg | Freq: Once | INTRAMUSCULAR | Status: AC
  Administered 2015-11-12: 1 mg via INTRAVENOUS
  Filled 2015-11-12: qty 1

## 2015-11-12 MED ORDER — HYDROMORPHONE HCL 1 MG/ML IJ SOLN
0.5000 mg | Freq: Once | INTRAMUSCULAR | Status: AC
  Administered 2015-11-12: 0.5 mg via INTRAVENOUS
  Filled 2015-11-12: qty 1

## 2015-11-12 MED ORDER — ACETAMINOPHEN 325 MG PO TABS
650.0000 mg | ORAL_TABLET | Freq: Four times a day (QID) | ORAL | Status: DC | PRN
  Administered 2015-11-13: 650 mg via ORAL
  Filled 2015-11-12: qty 2

## 2015-11-12 NOTE — H&P (Signed)
[facility]          History and Physical    PCP:   [name], MD   EDP: Ezequiel Essex, M.D.  Chief Complaint:  Lower back pain  HPI: Patient is a 65 year old man with history of renal cell carcinoma and gout who presents to the hospital with a bout a 5 day history of severe lower back pain. He came to the emergency department last week where x-rays were obtained which show degenerative joint disease and he was discharged home with treatment for musculoskeletal back pain. He re-presented today due to the severity of his pain. He states that he has noted a "small knot" to his lower back but over the past few days has expanded in size. He denies any fevers or chills, does not know about any insect bites or injuries to the area. Has not had any recent operations or procedures involving the lower back or the buttocks area. On CT scan he was found to have a 4 x 3 x 10 cm right perirectal abscess with focal right rectal wall thickening. Has been seen in consultation by [name] who plans on a surgical incision and drainage tomorrow morning. Patient will be admitted to her service for further evaluation and management.  Allergies:   Allergies  Allergen Reactions  . Amoxicillin Nausea And Vomiting  . Nsaids Other (See Comments)    Patient only has one kidney.   . Demerol [Meperidine] Rash  . Meperidine Hcl Hives and Rash      Past Medical History  Diagnosis Date  . Gout   . DJD (degenerative joint disease) of cervical spine   . Diverticulosis of colon     by colonoscopy 11/02/2009  . Barrett's esophagus     by EGD 11/02/2009  . Anemia   . Cancer [facility])     "was told has cancer on other kidney and spot on pancreas"  . 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 GERD (gastroesophageal reflux disease)     Past Surgical History  Procedure Laterality Date  . Nephrectomy      right due to cancer  . Lung removal, partial      left lower lobe,  benign  . Eus  08/07/2012    Procedure: UPPER ENDOSCOPIC ULTRASOUND (EUS) LINEAR;  Surgeon: [name], MD;  Location: [facility];  Service: Endoscopy;  Laterality: N/A;  . Inguinal hernia repair    . Hernia repair    . Inguinal hernia repair      Right  . Eus  08/21/2012    Procedure: UPPER ENDOSCOPIC ULTRASOUND (EUS) LINEAR;  Surgeon: [name], MD;  Location: [facility];  Service: Endoscopy;  Laterality: N/A;  . Colonoscopy  Feb 2011    [name]: single anal papilla, pan-colonic diverticula  . Esophagogastroduodenoscopy  Feb 2011    [name]: salmon-colored epithelium consistent with Barrett's, nodular antral erosions, small hiatal hernia, negative H.pylori  . Colonoscopy  09/25/2012    RMR: Colonic diverticulosis. Suspect diverticular bleeding-hepatic flexure tic-sealed  . Esophagogastroduodenoscopy  09/25/2012    [id number] esophagus. Small hiatal hernia. Antral erosions of doubtful clinical significance, no biopsies  . Esophagogastroduodenoscopy N/A 03/11/2013    SLF: Barrett's esophagus/Single ulcer in the gastric antrum-PROBABLE SOURCE FOR BLACK STOOL/ Chronic gastritis in the gastric antrum  . Esophagogastroduodenoscopy N/A 10/11/2014    Dr. [name] barrett's s/p biopsy. Hiatal  hernia. Biopsy with intestinal metaplasia, glandular atypia indefinite for low grade dysplasia noted as well  . Esophagogastroduodenoscopy N/A 01/19/2015    Procedure: ESOPHAGOGASTRODUODENOSCOPY (EGD);  Surgeon: [name], MD;  [facility];  Service: Endoscopy;  Laterality: N/A;  200    Prior to Admission medications   Medication Sig Start Date End Date Taking? Authorizing Provider  diphenhydrAMINE (BENADRYL) 25 MG tablet Take 25 mg by mouth daily as needed for itching.   Yes Historical Provider, MD  indomethacin (INDOCIN) 50 MG capsule Take 50 mg by mouth 3 (three) times daily.  07/05/15  Yes Historical Provider, MD  oxyCODONE-acetaminophen (PERCOCET) 5-325 MG tablet  Take 1-2 tablets by mouth every 4 (four) hours as needed. Patient taking differently: Take 1-2 tablets by mouth every 4 (four) hours as needed for moderate pain.  11/09/15  Yes [name], MD  pantoprazole (PROTONIX) 40 MG tablet TAKE (1) TABLET BY MOUTH TWICE A DAY BEFORE MEALS. (BREAKFAST AND SUPPER) 04/26/15  Yes Carlis Stable, NP  pazopanib (VOTRIENT) 200 MG tablet Take 2 tablets by mouth, daily on an empty stomach 10/06/15  Yes [name], MD  predniSONE (DELTASONE) 50 MG tablet 1 tablet for 6 days, one half tablet for 6 days 11/09/15  Yes [name], MD  topiramate (TOPAMAX) 25 MG tablet Take 25 mg by mouth 2 (two) times daily.   Yes Historical Provider, MD    Social History:  reports that he has quit smoking. His smoking use included Cigars. He has never used smokeless tobacco. He reports that he drinks about 1.2 - 1.8 oz of alcohol per week. He reports that he does not use illicit drugs.  Family History  Problem Relation Age of Onset  . Lung cancer Brother   . Prostate cancer Brother   . Colon cancer Neg Hx   . Lumbar disc disease Brother   . Prostate cancer Brother     Review of Systems:  Constitutional: Denies fever, chills, diaphoresis, appetite change and fatigue.  HEENT: Denies photophobia, eye pain, redness, hearing loss, ear pain, congestion, sore throat, rhinorrhea, sneezing, mouth sores, trouble swallowing, neck pain, neck stiffness and tinnitus.   Respiratory: Denies SOB, DOE, cough, chest tightness,  and wheezing.   Cardiovascular: Denies chest pain, palpitations and leg swelling.  Gastrointestinal: Denies nausea, vomiting, abdominal pain, diarrhea, constipation, blood in stool and abdominal distention.  Genitourinary: Denies dysuria, urgency, frequency, hematuria, flank pain and difficulty urinating.  Endocrine: Denies: hot or cold intolerance, sweats, changes in hair or nails, polyuria, polydipsia. Musculoskeletal: Denies myalgias, , joint swelling, arthralgias and gait  problem.  Skin: Denies pallor, rash and wound.  Neurological: Denies dizziness, seizures, syncope, weakness, light-headedness, numbness and headaches.  Hematological: Denies adenopathy. Easy bruising, personal or family bleeding history  Psychiatric/Behavioral: Denies suicidal ideation, mood changes, confusion, nervousness, sleep disturbance and agitation   Physical Exam: Blood pressure 146/68, pulse 101, temperature 99.6 F (37.6 C), temperature source Oral, resp. rate 20, height 5' 11"  (1.803 m), weight 77.8 kg (171 lb 8.3 oz), SpO2 98 %. General: Alert, awake, oriented 3, in severe distress due to pain HEENT: Normocephalic, atraumatic, pupils equal round and reactive to light, extraocular movements intact, poor dentition, dry mucous membranes with cracked lips and tongue Neck: Supple, no JVD, no lymphadenopathy, no bruits, no goiter exam cardiovascular: Regular rate and rhythm, no murmurs, rubs or gallops excellent lungs: Clear to auscultation bilaterally Abdomen: Soft, nontender, nondistended, positive bowel sounds, no organomegaly noted Extremities: No clubbing, cyanosis or edema,  positive pulses Neurologic: Grossly intact and nonfocal Back: Patient has a significantly indurated area along the right perirectal region.  Labs on Admission:  Results for orders placed or performed during the hospital encounter of 11/12/15 (from the past 48 hour(s))  CBC with Differential     Status: Abnormal   Collection Time: 11/12/15 10:39 AM  Result Value Ref Range   WBC 7.4 4.0 - 10.5 K/uL   RBC 2.46 (L) 4.22 - 5.81 MIL/uL   Hemoglobin 8.8 (L) 13.0 - 17.0 g/dL   HCT 26.6 (L) 39.0 - 52.0 %   MCV 108.1 (H) 78.0 - 100.0 fL   MCH 35.8 (H) 26.0 - 34.0 pg   MCHC 33.1 30.0 - 36.0 g/dL   RDW 21.7 (H) 11.5 - 15.5 %   Platelets 205 150 - 400 K/uL   Neutrophils Relative % 92 %   Neutro Abs 6.8 1.7 - 7.7 K/uL   Lymphocytes Relative 4 %   Lymphs Abs 0.3 (L) 0.7 - 4.0 K/uL   Monocytes Relative 4 %    Monocytes Absolute 0.3 0.1 - 1.0 K/uL   Eosinophils Relative 0 %   Eosinophils Absolute 0.0 0.0 - 0.7 K/uL   Basophils Relative 0 %   Basophils Absolute 0.0 0.0 - 0.1 K/uL  Comprehensive metabolic panel     Status: Abnormal   Collection Time: 11/12/15 10:39 AM  Result Value Ref Range   Sodium 135 135 - 145 mmol/L   Potassium 2.9 (L) 3.5 - 5.1 mmol/L   Chloride 103 101 - 111 mmol/L   CO2 22 22 - 32 mmol/L   Glucose, Bld 79 65 - 99 mg/dL   BUN 32 (H) 6 - 20 mg/dL   Creatinine, Ser 1.49 (H) 0.61 - 1.24 mg/dL   Calcium 8.8 (L) 8.9 - 10.3 mg/dL   Total Protein 6.5 6.5 - 8.1 g/dL   Albumin 2.5 (L) 3.5 - 5.0 g/dL   AST 40 15 - 41 U/L   ALT 28 17 - 63 U/L   Alkaline Phosphatase 100 38 - 126 U/L   Total Bilirubin 0.7 0.3 - 1.2 mg/dL   GFR calc non Af Amer 48 (L) >60 mL/min   GFR calc Af Amer 55 (L) >60 mL/min    Comment: (NOTE) The eGFR has been calculated using the CKD EPI equation. This calculation has not been validated in all clinical situations. eGFR's persistently <60 mL/min signify possible Chronic Kidney Disease.    Anion gap 10 5 - 15  Protime-INR     Status: Abnormal   Collection Time: 11/12/15 10:39 AM  Result Value Ref Range   Prothrombin Time 15.3 (H) 11.6 - 15.2 seconds   INR 1.19 0.00 - 1.49  Blood culture (routine x 2)     Status: None (Preliminary result)   Collection Time: 11/12/15 11:50 AM  Result Value Ref Range   Specimen Description RIGHT ANTECUBITAL    Special Requests BOTTLES DRAWN AEROBIC AND ANAEROBIC 8CC EACH    Culture PENDING    Report Status PENDING   Blood culture (routine x 2)     Status: None (Preliminary result)   Collection Time: 11/12/15 12:00 PM  Result Value Ref Range   Specimen Description BLOOD LEFT HAND    Special Requests BOTTLES DRAWN AEROBIC AND ANAEROBIC 8CC EACH    Culture PENDING    Report Status PENDING   Lactic acid, plasma     Status: Abnormal   Collection Time: 11/12/15 12:00 PM  Result Value Ref Range  Lactic Acid,  Venous 2.1 (HH) 0.5 - 2.0 mmol/L    Comment: CRITICAL RESULT CALLED TO, READ BACK BY AND VERIFIED WITH: YOUNG E. AT 1233A ON [id number] BY THOMPSON S.   Prepare RBC     Status: None   Collection Time: 11/12/15  2:30 PM  Result Value Ref Range   Order Confirmation ORDER PROCESSED BY BLOOD BANK   Type and screen [facility]     Status: None (Preliminary result)   Collection Time: 11/12/15  2:30 PM  Result Value Ref Range   ABO/RH(D) B NEG    Antibody Screen NEG    Sample Expiration 11/15/2015    Unit Number [id number]    Blood Component Type RED CELLS,LR    Unit division 00    Status of Unit ALLOCATED    Transfusion Status OK TO TRANSFUSE    Crossmatch Result Compatible    Unit Number [id number]    Blood Component Type RBC LR PHER1    Unit division 00    Status of Unit ALLOCATED    Transfusion Status OK TO TRANSFUSE    Crossmatch Result Compatible     Radiological Exams on Admission: Ct Abdomen Pelvis Wo Contrast  11/12/2015  CLINICAL DATA:  64 year old male with abdominal and back pain and rectal pain and fullness. EXAM: CT ABDOMEN AND PELVIS WITHOUT CONTRAST TECHNIQUE: Multidetector CT imaging of the abdomen and pelvis was performed following the standard protocol without IV contrast. COMPARISON:  09/13/2015 and prior CTs FINDINGS: Please note that parenchymal abnormalities may be missed without intravenous contrast. Lower chest:  A trace left pleural effusion is noted. Hepatobiliary: The liver and gallbladder are unremarkable. There is no evidence of biliary dilatation. Pancreas: Unremarkable Spleen: Upper limits of normal sized Adrenals/Urinary Tract: A mass within the upper left kidney is grossly unchanged. An upper pole cyst is again noted. Patient is status post right nephrectomy. The adrenal glands and bladder are unchanged. Stomach/Bowel: A 4 x 2.3 x 10.5 cm (AP x transverse x CC) right perirectal abscess is identified and extends from the of the mid-distal rectum to  the inner right gluteal region. Focal right rectal wall thickening is identified (image 85). There is no evidence of bowel obstruction, pneumoperitoneum or other definite bowel wall thickening. Vascular/Lymphatic: No evidence of abdominal aortic aneurysm or enlarged lymph nodes. Reproductive: Mild prostate enlargement noted. Other: No free fluid. Musculoskeletal: No acute abnormalities identified. IMPRESSION: 4 x 2.8 x 10.5 cm right perirectal abscess with focal right rectal wall thickening. Grossly unchanged left upper renal mass. Trace left pleural effusion. Electronically Signed   By: Margarette Canada M.D.   On: 11/12/2015 10:52   Dg Chest 2 View  11/12/2015  CLINICAL DATA:  Rectal cancer, unspecified laterality.  Back pain. EXAM: CHEST  2 VIEW COMPARISON:  01/03/2015 FINDINGS: Chronic blunting at the left costophrenic angle. Old deformity of the left sixth rib. Lungs are clear without airspace disease or pulmonary edema. Heart and mediastinum are stable and within normal limits. No pleural effusions. IMPRESSION: No active cardiopulmonary disease.- Electronically Signed   By: Markus Daft M.D.   On: 11/12/2015 14:20    Assessment/Plan Principal Problem:   Perirectal abscess Active Problems:   Renal cell cancer (HCC)   Hypokalemia   Anemia of chronic disease    Right perirectal abscess -Check blood cultures. -Vancomycin and Zosyn for now, can narrow based on operative cultures. -2 OR tomorrow for surgical incision and drainage.  Renal cell carcinoma -Continue outpatient follow-up with oncology  Hypokalemia -Replace orally, check magnesium  Anemia of chronic disease -Likely related to renal cell carcinoma, hemoglobin is 8.8 on admission. -[name] is requesting a transfusion of one unit appear prior to surgical procedure in a.m.  DVT prophylaxis -Subcutaneous heparin  CODE STATUS -Full code as discussed with patient and wife [name] at bedside    Time Spent on Admission: 75  minutes  HERNANDEZ ACOSTA,Jidenna Figgs [facility] Pager: [phone] 11/12/2015, 6:38 PM

## 2015-11-12 NOTE — ED Notes (Signed)
CRITICAL VALUE ALERT  Critical value received:  Lactic acid 2.1  Date of notification:  11/11/14  Time of notification:  1234  Critical value read back: yes  Nurse who received alert:  Ilda Mori  MD notified (1st page): Rancour  Time of first page: 1234  MD notified (2nd page):  Time of second page:  Responding MD:  Rancour  Time MD responded:  [id number]

## 2015-11-12 NOTE — ED Provider Notes (Signed)
CSN: [id number]     Arrival date & time 11/12/15  0902 History   First MD Initiated Contact with Patient 11/12/15 [phone]     Chief Complaint  Patient presents with  . Back Pain     (Consider location/radiation/quality/duration/timing/severity/associated sxs/prior Treatment) HPI Comments: Patient is a 65 year old male who presents to the emergency department with a complaint of back pain.  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  The patient states that he has frequent bouts with back pain, as well as joint pains of several other areas, but he has recently noticed a knot of his lower back and his right buttocks area. He states that it will come out for a little while and then go in. It is now come out and it will not go back in. He states that it is very sore to touch, and at times has a tingling type sensation. He denies any fever or chills related to this. He's not had any known injury to the area. He's not had any recent operations or procedures involving the lower back or the buttocks area. There's been no blood in the stools, no blood in the urine. His been nausea, but no vomiting related to this pain.  Patient is a 65 y.o. male presenting with back pain. The history is provided by the patient.  Back Pain   Past Medical History  Diagnosis Date  . Gout   . DJD (degenerative joint disease) of cervical spine   . Diverticulosis of colon     by colonoscopy 11/02/2009  . Barrett's esophagus     by EGD 11/02/2009  . Anemia   . Cancer [facility])     "was told has cancer on other kidney and spot on pancreas"  . 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 GERD (gastroesophageal reflux disease)    Past Surgical History  Procedure Laterality Date  . Nephrectomy      right due to cancer  . Lung removal, partial      left lower lobe, benign  . Eus   08/07/2012    Procedure: UPPER ENDOSCOPIC ULTRASOUND (EUS) LINEAR;  Surgeon: [name], MD;  Location: [facility];  Service: Endoscopy;  Laterality: N/A;  . Inguinal hernia repair    . Hernia repair    . Inguinal hernia repair      Right  . Eus  08/21/2012    Procedure: UPPER ENDOSCOPIC ULTRASOUND (EUS) LINEAR;  Surgeon: [name], MD;  Location: [facility];  Service: Endoscopy;  Laterality: N/A;  . Colonoscopy  Feb 2011    [name]: single anal papilla, pan-colonic diverticula  . Esophagogastroduodenoscopy  Feb 2011    [name]: salmon-colored epithelium consistent with Barrett's, nodular antral erosions, small hiatal hernia, negative H.pylori  . Colonoscopy  09/25/2012    RMR: Colonic diverticulosis. Suspect diverticular bleeding-hepatic flexure tic-sealed  . Esophagogastroduodenoscopy  09/25/2012    [id number] esophagus. Small hiatal hernia. Antral erosions of doubtful clinical significance, no biopsies  . Esophagogastroduodenoscopy N/A 03/11/2013    SLF: Barrett's esophagus/Single ulcer in the gastric antrum-PROBABLE SOURCE FOR BLACK STOOL/ Chronic gastritis in the gastric antrum  . Esophagogastroduodenoscopy N/A 10/11/2014    Dr. [name] barrett's s/p biopsy. Hiatal hernia. Biopsy with intestinal metaplasia, glandular atypia indefinite for low grade dysplasia noted as well  . Esophagogastroduodenoscopy N/A 01/19/2015  Procedure: ESOPHAGOGASTRODUODENOSCOPY (EGD);  Surgeon: [name], MD;  Location: [facility];  Service: Endoscopy;  Laterality: N/A;  200   Family History  Problem Relation Age of Onset  . Lung cancer Brother   . Prostate cancer Brother   . Colon cancer Neg Hx   . Lumbar disc disease Brother   . Prostate cancer Brother    Social History  Substance Use Topics  . Smoking status: Former Smoker -- 0.25 packs/day for 4 years    Types: Cigars  . Smokeless tobacco: Never Used     Comment: occasional cigar  . Alcohol Use: 1.2 - 1.8  oz/week    2-3 Cans of beer per week     Comment: vodka and budweiser, sometimes daily, depends on notion. couple drinks. 2-3 beers on a weekend.    Review of Systems  Gastrointestinal: Positive for nausea.  Musculoskeletal: Positive for back pain and arthralgias.  All other systems reviewed and are negative.     Allergies  Amoxicillin; Nsaids; Demerol; and Meperidine hcl  Home Medications   Prior to Admission medications   Medication Sig Start Date End Date Taking? Authorizing Provider  indomethacin (INDOCIN) 50 MG capsule Take 50 mg by mouth 3 (three) times daily.  07/05/15   Historical Provider, MD  oxyCODONE-acetaminophen (PERCOCET) 5-325 MG tablet Take 1-2 tablets by mouth every 4 (four) hours as needed. 11/09/15   [name], MD  pantoprazole (PROTONIX) 40 MG tablet TAKE (1) TABLET BY MOUTH TWICE A DAY BEFORE MEALS. (BREAKFAST AND SUPPER) 04/26/15   Carlis Stable, NP  pazopanib (VOTRIENT) 200 MG tablet Take 2 tablets by mouth, daily on an empty stomach 10/06/15   [name], MD  predniSONE (DELTASONE) 50 MG tablet 1 tablet for 6 days, one half tablet for 6 days 11/09/15   [name], MD  topiramate (TOPAMAX) 25 MG tablet Take 25 mg by mouth 2 (two) times daily.    Historical Provider, MD   BP 132/82 mmHg  Pulse 80  Temp(Src) 97.8 F (36.6 C) (Oral)  Resp 18  Ht 5\' 11"  (1.803 m)  Wt 74.39 kg  BMI 22.88 kg/m2  SpO2 100% Physical Exam  Constitutional: He is oriented to person, place, and time. He appears well-developed and well-nourished.  Non-toxic appearance.  HENT:  Head: Normocephalic.  Right Ear: Tympanic membrane and external ear normal.  Left Ear: Tympanic membrane and external ear normal.  Eyes: EOM and lids are normal. Pupils are equal, round, and reactive to light.  Neck: Normal range of motion. Neck supple. Carotid bruit is not present.  Cardiovascular: Normal rate, regular rhythm, normal heart sounds, intact distal pulses and normal pulses.   Pulmonary/Chest:  Breath sounds normal. No respiratory distress.  Abdominal: Soft. Bowel sounds are normal. There is no tenderness. There is no guarding.  Genitourinary:  There is a large abscess of the right buttocks extending to the anal area. There is no drainage appreciated this time. The area is warm to touch. It is very painful to palpation. There is no drainage noted from the rectal area.  Musculoskeletal: Normal range of motion.  There is pain to palpation of the lower lumbar spine area, as well as the lower paraspinal areas. There is no palpable step off.  Lymphadenopathy:       Head (right side): No submandibular adenopathy present.       Head (left side): No submandibular adenopathy present.    He has no cervical adenopathy.  Neurological: He is alert  and oriented to person, place, and time. He has normal strength. No cranial nerve deficit or sensory deficit.  Good sphincter tone of the rectum noted. No gross neurologic deficits appreciated on limited examination.  Skin: Skin is warm and dry.  Psychiatric: He has a normal mood and affect. His speech is normal.  Nursing note and vitals reviewed.   ED Course Pt seen with me by Dr. Wyvonnia Dusky.  Procedures (including critical care time) Labs Review Labs Reviewed - No data to display  Imaging Review No results found. I have personally reviewed and evaluated these images and lab results as part of my medical decision-making.   EKG Interpretation None      MDM Vital signs reviewed  CT scan reveal the abscess to be perirectal. Discussed this finding with the patient and wife. Spoke with Dr Arnoldo Morale, surgery. He will come by to see the patient.  Dr Arnoldo Morale with consult on this patient. Case discussed with triad hospitalist for admission. IV Vanc started. Additional pain med also given to the patient.   Final diagnoses:  Perirectal abscess  Renal cell carcinoma, unspecified laterality (HCC)    **I have reviewed nursing notes, vital signs, and  all appropriate lab and imaging results for this patient.Lily Kocher, PA-C 11/12/15 1433  Ezequiel Essex, MD 11/12/15 [phone]

## 2015-11-12 NOTE — Progress Notes (Signed)
Pharmacy Antibiotic Note  Jeffrey Trujillo is a 65 y.o. male admitted on 11/12/2015 with peri-rectal abscess.  Pharmacy has been consulted for Vancomycin and Zosyn dosing. Allergy to Amoxicillin noted = nausea / vomiting.   Plan:  Vancomycin 1500mg  now then 750mg  IV q12hrs  Zosyn 3.375gm IV q8h, EID, monitor for s/sx n/v  Check vanc trough at steady state  Monitor labs, renal fxn, progress and c/s  Deescalate ABX when appropriate  Height: 5\' 11"  (180.3 cm) Weight: 171 lb 8.3 oz (77.8 kg) IBW/kg (Calculated) : 75.3  Temp (24hrs), Avg:98.6 F (37 C), Min:97.8 F (36.6 C), Max:99.6 F (37.6 C)   Recent Labs Lab 11/12/15 1039 11/12/15 1200  WBC 7.4  --   CREATININE 1.49*  --   LATICACIDVEN  --  2.1*    Estimated Creatinine Clearance: 53.3 mL/min (by C-G formula based on Cr of 1.49).    Allergies  Allergen Reactions  . Amoxicillin Nausea And Vomiting  . Nsaids Other (See Comments)    Patient only has one kidney.   . Demerol [Meperidine] Rash  . Meperidine Hcl Hives and Rash   Anti-infectives    Start     Dose/Rate Route Frequency Ordered Stop   11/13/15 0600  vancomycin (VANCOCIN) IVPB 750 mg/150 ml premix     750 mg 150 mL/hr over 60 Minutes Intravenous Every 12 hours 11/12/15 1954     11/12/15 2200  piperacillin-tazobactam (ZOSYN) IVPB 3.375 g     3.375 g 12.5 mL/hr over 240 Minutes Intravenous Every 8 hours 11/12/15 1952     11/12/15 2030  vancomycin (VANCOCIN) 1,500 mg in sodium chloride 0.9 % 500 mL IVPB     1,500 mg 250 mL/hr over 120 Minutes Intravenous  Once 11/12/15 1952     11/12/15 1430  vancomycin (VANCOCIN) IVPB 1000 mg/200 mL premix  Status:  Discontinued     1,000 mg 200 mL/hr over 60 Minutes Intravenous  Once 11/12/15 1428 11/12/15 1435   11/12/15 1300  ciprofloxacin (CIPRO) IVPB 400 mg     400 mg 200 mL/hr over 60 Minutes Intravenous  Once 11/12/15 1259 11/12/15 1555   11/12/15 1300  metroNIDAZOLE (FLAGYL) IVPB 500 mg     500 mg 100 mL/hr over  60 Minutes Intravenous  Once 11/12/15 1259 11/12/15 1434     Thank you for allowing pharmacy to be a part of this patient's care.  Hart Robinsons A 11/12/2015 7:54 PM

## 2015-11-12 NOTE — Consult Note (Signed)
Reason for Consult: Perirectal abscess Referring Physician: ER, hospitalist  Jeffrey Trujillo is an 65 y.o. male.  HPI: Patient is a 65 year old white male with history of metastatic renal cell carcinoma, on Votrient, who presents with lower back and buttock pain. CT scan of the abdomen reveals a large right perirectal abscess extending up to the right rectal wall. No drainage has been noted. Patient has become increasingly uncomfortable. He last had a colonoscopy in 2014 by [name].  Past Medical History  Diagnosis Date  . Gout   . DJD (degenerative joint disease) of cervical spine   . Diverticulosis of colon     by colonoscopy 11/02/2009  . Barrett's esophagus     by EGD 11/02/2009  . Anemia   . Cancer [facility])     "was told has cancer on other kidney and spot on pancreas"  . 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 GERD (gastroesophageal reflux disease)     Past Surgical History  Procedure Laterality Date  . Nephrectomy      right due to cancer  . Lung removal, partial      left lower lobe, benign  . Eus  08/07/2012    Procedure: UPPER ENDOSCOPIC ULTRASOUND (EUS) LINEAR;  Surgeon: [name], MD;  Location: [facility];  Service: Endoscopy;  Laterality: N/A;  . Inguinal hernia repair    . Hernia repair    . Inguinal hernia repair      Right  . Eus  08/21/2012    Procedure: UPPER ENDOSCOPIC ULTRASOUND (EUS) LINEAR;  Surgeon: [name], MD;  Location: [facility];  Service: Endoscopy;  Laterality: N/A;  . Colonoscopy  Feb 2011    [name]: single anal papilla, pan-colonic diverticula  . Esophagogastroduodenoscopy  Feb 2011    [name]: salmon-colored epithelium consistent with Barrett's, nodular antral erosions, small hiatal hernia, negative H.pylori  . Colonoscopy  09/25/2012    RMR: Colonic diverticulosis. Suspect diverticular bleeding-hepatic flexure tic-sealed  . Esophagogastroduodenoscopy  09/25/2012    [id number] esophagus. Small hiatal hernia.  Antral erosions of doubtful clinical significance, no biopsies  . Esophagogastroduodenoscopy N/A 03/11/2013    SLF: Barrett's esophagus/Single ulcer in the gastric antrum-PROBABLE SOURCE FOR BLACK STOOL/ Chronic gastritis in the gastric antrum  . Esophagogastroduodenoscopy N/A 10/11/2014    Dr. [name] barrett's s/p biopsy. Hiatal hernia. Biopsy with intestinal metaplasia, glandular atypia indefinite for low grade dysplasia noted as well  . Esophagogastroduodenoscopy N/A 01/19/2015    Procedure: ESOPHAGOGASTRODUODENOSCOPY (EGD);  Surgeon: [name], MD;  [facility]: [facility];  Service: Endoscopy;  Laterality: N/A;  200    Family History  Problem Relation Age of Onset  . Lung cancer Brother   . Prostate cancer Brother   . Colon cancer Neg Hx   . Lumbar disc disease Brother   . Prostate cancer Brother     Social History:  reports that he has quit smoking. His smoking use included Cigars. He has never used smokeless tobacco. He reports that he drinks about 1.2 - 1.8 oz of alcohol per week. He reports that he does not use illicit drugs.  Allergies:  Allergies  Allergen Reactions  . Amoxicillin Nausea And Vomiting  . Nsaids Other (See Comments)    Patient only has one kidney.   . Demerol [Meperidine] Rash  . Meperidine Hcl Hives and Rash    Medications: I have reviewed the patient's current medications.  Results for orders placed or performed during  the hospital encounter of 11/12/15 (from the past 48 hour(s))  CBC with Differential     Status: Abnormal   Collection Time: 11/12/15 10:39 AM  Result Value Ref Range   WBC 7.4 4.0 - 10.5 K/uL   RBC 2.46 (L) 4.22 - 5.81 MIL/uL   Hemoglobin 8.8 (L) 13.0 - 17.0 g/dL   HCT 26.6 (L) 39.0 - 52.0 %   MCV 108.1 (H) 78.0 - 100.0 fL   MCH 35.8 (H) 26.0 - 34.0 pg   MCHC 33.1 30.0 - 36.0 g/dL   RDW 21.7 (H) 11.5 - 15.5 %   Platelets 205 150 - 400 K/uL   Neutrophils Relative % 92 %   Neutro Abs 6.8 1.7 - 7.7 K/uL   Lymphocytes  Relative 4 %   Lymphs Abs 0.3 (L) 0.7 - 4.0 K/uL   Monocytes Relative 4 %   Monocytes Absolute 0.3 0.1 - 1.0 K/uL   Eosinophils Relative 0 %   Eosinophils Absolute 0.0 0.0 - 0.7 K/uL   Basophils Relative 0 %   Basophils Absolute 0.0 0.0 - 0.1 K/uL  Comprehensive metabolic panel     Status: Abnormal   Collection Time: 11/12/15 10:39 AM  Result Value Ref Range   Sodium 135 135 - 145 mmol/L   Potassium 2.9 (L) 3.5 - 5.1 mmol/L   Chloride 103 101 - 111 mmol/L   CO2 22 22 - 32 mmol/L   Glucose, Bld 79 65 - 99 mg/dL   BUN 32 (H) 6 - 20 mg/dL   Creatinine, Ser 1.49 (H) 0.61 - 1.24 mg/dL   Calcium 8.8 (L) 8.9 - 10.3 mg/dL   Total Protein 6.5 6.5 - 8.1 g/dL   Albumin 2.5 (L) 3.5 - 5.0 g/dL   AST 40 15 - 41 U/L   ALT 28 17 - 63 U/L   Alkaline Phosphatase 100 38 - 126 U/L   Total Bilirubin 0.7 0.3 - 1.2 mg/dL   GFR calc non Af Amer 48 (L) >60 mL/min   GFR calc Af Amer 55 (L) >60 mL/min    Comment: (NOTE) The eGFR has been calculated using the CKD EPI equation. This calculation has not been validated in all clinical situations. eGFR's persistently <60 mL/min signify possible Chronic Kidney Disease.    Anion gap 10 5 - 15  Blood culture (routine x 2)     Status: None (Preliminary result)   Collection Time: 11/12/15 11:50 AM  Result Value Ref Range   Specimen Description RIGHT ANTECUBITAL    Special Requests BOTTLES DRAWN AEROBIC AND ANAEROBIC 8CC EACH    Culture PENDING    Report Status PENDING   Blood culture (routine x 2)     Status: None (Preliminary result)   Collection Time: 11/12/15 12:00 PM  Result Value Ref Range   Specimen Description BLOOD LEFT HAND    Special Requests BOTTLES DRAWN AEROBIC AND ANAEROBIC 8CC EACH    Culture PENDING    Report Status PENDING   Lactic acid, plasma     Status: Abnormal   Collection Time: 11/12/15 12:00 PM  Result Value Ref Range   Lactic Acid, Venous 2.1 (HH) 0.5 - 2.0 mmol/L    Comment: CRITICAL RESULT CALLED TO, READ BACK BY AND  VERIFIED WITH: YOUNG E. AT 1233A ON [id number] BY THOMPSON S.     Ct Abdomen Pelvis Wo Contrast  11/12/2015  CLINICAL DATA:  65 year old male with abdominal and back pain and rectal pain and fullness. EXAM: CT ABDOMEN AND PELVIS WITHOUT CONTRAST  TECHNIQUE: Multidetector CT imaging of the abdomen and pelvis was performed following the standard protocol without IV contrast. COMPARISON:  09/13/2015 and prior CTs FINDINGS: Please note that parenchymal abnormalities may be missed without intravenous contrast. Lower chest:  A trace left pleural effusion is noted. Hepatobiliary: The liver and gallbladder are unremarkable. There is no evidence of biliary dilatation. Pancreas: Unremarkable Spleen: Upper limits of normal sized Adrenals/Urinary Tract: A mass within the upper left kidney is grossly unchanged. An upper pole cyst is again noted. Patient is status post right nephrectomy. The adrenal glands and bladder are unchanged. Stomach/Bowel: A 4 x 2.3 x 10.5 cm (AP x transverse x CC) right perirectal abscess is identified and extends from the of the mid-distal rectum to the inner right gluteal region. Focal right rectal wall thickening is identified (image 85). There is no evidence of bowel obstruction, pneumoperitoneum or other definite bowel wall thickening. Vascular/Lymphatic: No evidence of abdominal aortic aneurysm or enlarged lymph nodes. Reproductive: Mild prostate enlargement noted. Other: No free fluid. Musculoskeletal: No acute abnormalities identified. IMPRESSION: 4 x 2.8 x 10.5 cm right perirectal abscess with focal right rectal wall thickening. Grossly unchanged left upper renal mass. Trace left pleural effusion. Electronically Signed   By: Margarette Canada M.D.   On: 11/12/2015 10:52    ROS: See chart Blood pressure 113/60, pulse 80, temperature 98.3 F (36.8 C), temperature source Oral, resp. rate 18, height 5' 11"  (1.803 m), weight 74.39 kg (164 lb), SpO2 97 %. Physical Exam: Pleasant white male who is  definitely uncomfortable while lying in the bed due to the buttock pain. Lungs clear to auscultation with breath sounds bilaterally. Heart examination reveals regular rate and rhythm without S3, S4, murmurs. Rectal examination was limited secondary to pain. He had a significantly indurated area along the right perirectal region. No digital examination was performed.  Assessment/Plan: Impression: Perirectal abscess, history of metastatic renal cell carcinoma, hypokalemia Plan: Patient will be needed to be taken to the operating room for incision and drainage of the right perirectal abscess. He will need 1 unit of packed red blood cells and his hypokalemia corrected. The risks and benefits of the procedure including bleeding, infection, and a possibly recurrence of the perirectal abscess performed fully explained to the patient, who gave informed consent. He will need a PICC line.  Jeffrey Trujillo A 11/12/2015, 1:39 PM

## 2015-11-12 NOTE — ED Notes (Signed)
Pt here for back pain and states he also has a "knot" near his rectum. Denies drainage. States he was here recently for back pain and gout.

## 2015-11-12 NOTE — ED Provider Notes (Signed)
By signing my name below, I, Altamease Oiler, attest that this documentation has been prepared under the direction and in the presence of Ezequiel Essex, MD. Electronically Signed: Altamease Oiler, ED Scribe. 11/12/2015. 11:43 AM   Medical screening examination/treatment/procedure(s) were conducted as a shared visit with non-physician practitioner Lily Kocher PA-C and myself.  I personally evaluated the patient during the encounter.  Pt here for lower back pain that radiates down the BLEs R>L and an abscess at the right buttock. No nausea, vomiting, or fever.  Hx metastatic renal cell carcinoma on chemotherapy.  Uncomfortable, CTAB, RRR, abd soft and non-tender, large 10 cm area of erythema and induration to right buttock, 5/5 strength to BLEs, intact DP/PT pulses. Paraspinal lumbar tenderness without midline tenderness. Ankles flexion and extension intact. Able to lift each leg off bed.   EKG Interpretation None       I personally performed the services described in this documentation, which was scribed in my presence. The recorded information has been reviewed and is accurate.   Ezequiel Essex, MD 11/12/15 1550

## 2015-11-13 ENCOUNTER — Inpatient Hospital Stay (HOSPITAL_COMMUNITY): Payer: BLUE CROSS/BLUE SHIELD | Admitting: Anesthesiology

## 2015-11-13 ENCOUNTER — Encounter (HOSPITAL_COMMUNITY): Payer: Self-pay

## 2015-11-13 ENCOUNTER — Encounter (HOSPITAL_COMMUNITY)
Admission: EM | Disposition: A | Discharge status: Home Health | Payer: Self-pay | Source: Home / Self Care | Attending: Internal Medicine

## 2015-11-13 HISTORY — PX: INCISION AND DRAINAGE ABSCESS: SHX5864

## 2015-11-13 LAB — CBC
HEMATOCRIT: 28.1 % — AB (ref 39.0–52.0)
Hemoglobin: 9.2 g/dL — ABNORMAL LOW (ref 13.0–17.0)
MCH: 34.5 pg — ABNORMAL HIGH (ref 26.0–34.0)
MCHC: 32.7 g/dL (ref 30.0–36.0)
MCV: 105.2 fL — AB (ref 78.0–100.0)
Platelets: 198 10*3/uL (ref 150–400)
RBC: 2.67 MIL/uL — AB (ref 4.22–5.81)
RDW: 22.2 % — AB (ref 11.5–15.5)
WBC: 6.5 10*3/uL (ref 4.0–10.5)

## 2015-11-13 LAB — BASIC METABOLIC PANEL
Anion gap: 7 (ref 5–15)
BUN: 28 mg/dL — AB (ref 6–20)
CHLORIDE: 108 mmol/L (ref 101–111)
CO2: 19 mmol/L — ABNORMAL LOW (ref 22–32)
Calcium: 8.2 mg/dL — ABNORMAL LOW (ref 8.9–10.3)
Creatinine, Ser: 1.53 mg/dL — ABNORMAL HIGH (ref 0.61–1.24)
GFR calc Af Amer: 54 mL/min — ABNORMAL LOW (ref >60)
GFR calc non Af Amer: 46 mL/min — ABNORMAL LOW (ref >60)
Glucose, Bld: 111 mg/dL — ABNORMAL HIGH (ref 65–99)
POTASSIUM: 4.9 mmol/L (ref 3.5–5.1)
SODIUM: 134 mmol/L — AB (ref 135–145)

## 2015-11-13 LAB — SURGICAL PCR SCREEN
MRSA, PCR: NEGATIVE
Staphylococcus aureus: NEGATIVE

## 2015-11-13 SURGERY — INCISION AND DRAINAGE, ABSCESS
Anesthesia: General

## 2015-11-13 MED ORDER — LIDOCAINE HCL (CARDIAC) 20 MG/ML IV SOLN
INTRAVENOUS | Status: DC | PRN
  Administered 2015-11-13: 50 mg via INTRATRACHEAL

## 2015-11-13 MED ORDER — PROPOFOL 10 MG/ML IV BOLUS
INTRAVENOUS | Status: DC | PRN
  Administered 2015-11-13: 150 mg via INTRAVENOUS

## 2015-11-13 MED ORDER — ONDANSETRON HCL 4 MG/2ML IJ SOLN
INTRAMUSCULAR | Status: DC | PRN
  Administered 2015-11-13: 4 mg via INTRAVENOUS

## 2015-11-13 MED ORDER — ROCURONIUM BROMIDE 50 MG/5ML IV SOLN
INTRAVENOUS | Status: AC
  Filled 2015-11-13: qty 1

## 2015-11-13 MED ORDER — FENTANYL CITRATE (PF) 250 MCG/5ML IJ SOLN
INTRAMUSCULAR | Status: DC | PRN
  Administered 2015-11-13 (×3): 50 ug via INTRAVENOUS
  Administered 2015-11-13: 25 ug via INTRAVENOUS
  Administered 2015-11-13: 50 ug via INTRAVENOUS
  Administered 2015-11-13: 25 ug via INTRAVENOUS

## 2015-11-13 MED ORDER — DIPHENHYDRAMINE HCL 50 MG/ML IJ SOLN
25.0000 mg | Freq: Three times a day (TID) | INTRAMUSCULAR | Status: DC | PRN
  Administered 2015-11-13 – 2015-11-18 (×8): 25 mg via INTRAVENOUS
  Filled 2015-11-13 (×9): qty 1

## 2015-11-13 MED ORDER — LIDOCAINE HCL (PF) 1 % IJ SOLN
INTRAMUSCULAR | Status: AC
  Filled 2015-11-13: qty 5

## 2015-11-13 MED ORDER — FENTANYL CITRATE (PF) 250 MCG/5ML IJ SOLN
INTRAMUSCULAR | Status: AC
  Filled 2015-11-13: qty 5

## 2015-11-13 MED ORDER — HYDROMORPHONE HCL 1 MG/ML IJ SOLN
1.0000 mg | INTRAMUSCULAR | Status: DC | PRN
  Administered 2015-11-13 – 2015-11-14 (×8): 1 mg via INTRAVENOUS
  Filled 2015-11-13 (×8): qty 1

## 2015-11-13 MED ORDER — LORAZEPAM 2 MG/ML IJ SOLN
1.0000 mg | INTRAMUSCULAR | Status: DC | PRN
Start: 2015-11-13 — End: 2015-11-18

## 2015-11-13 MED ORDER — SUCCINYLCHOLINE CHLORIDE 20 MG/ML IJ SOLN
INTRAMUSCULAR | Status: AC
  Filled 2015-11-13: qty 1

## 2015-11-13 MED ORDER — LIDOCAINE VISCOUS 2 % MT SOLN
OROMUCOSAL | Status: AC
  Filled 2015-11-13: qty 15

## 2015-11-13 MED ORDER — ONDANSETRON HCL 4 MG/2ML IJ SOLN
INTRAMUSCULAR | Status: AC
  Filled 2015-11-13: qty 2

## 2015-11-13 MED ORDER — SODIUM CHLORIDE 0.9 % IR SOLN
Status: DC | PRN
  Administered 2015-11-13: 1000 mL

## 2015-11-13 MED ORDER — ACETAMINOPHEN 650 MG RE SUPP: 650.0000 mg | Freq: Four times a day (QID) | RECTAL | Status: DC | PRN

## 2015-11-13 MED ORDER — PROPOFOL 10 MG/ML IV BOLUS
INTRAVENOUS | Status: AC
  Filled 2015-11-13: qty 20

## 2015-11-13 MED ORDER — ZOLPIDEM TARTRATE 5 MG PO TABS
5.0000 mg | ORAL_TABLET | Freq: Every evening | ORAL | Status: DC | PRN
  Administered 2015-11-13 (×2): 5 mg via ORAL
  Filled 2015-11-13 (×2): qty 1

## 2015-11-13 MED ORDER — MIDAZOLAM HCL 5 MG/5ML IJ SOLN
INTRAMUSCULAR | Status: DC | PRN
  Administered 2015-11-13: 2 mg via INTRAVENOUS

## 2015-11-13 MED ORDER — MAGNESIUM SULFATE 2 GM/50ML IV SOLN
2.0000 g | Freq: Once | INTRAVENOUS | Status: AC
  Administered 2015-11-13: 2 g via INTRAVENOUS
  Filled 2015-11-13: qty 50

## 2015-11-13 MED ORDER — OXYCODONE-ACETAMINOPHEN 5-325 MG PO TABS
1.0000 | ORAL_TABLET | ORAL | Status: DC | PRN
  Administered 2015-11-13: 1 via ORAL
  Administered 2015-11-13: 2 via ORAL
  Administered 2015-11-14: 1 via ORAL
  Administered 2015-11-14: 2 via ORAL
  Administered 2015-11-15: 1 via ORAL
  Administered 2015-11-16 – 2015-11-18 (×8): 2 via ORAL
  Filled 2015-11-13 (×4): qty 2
  Filled 2015-11-13: qty 1
  Filled 2015-11-13 (×2): qty 2
  Filled 2015-11-13: qty 1
  Filled 2015-11-13 (×3): qty 2
  Filled 2015-11-13: qty 1
  Filled 2015-11-13: qty 2

## 2015-11-13 MED ORDER — LACTATED RINGERS IV SOLN
INTRAVENOUS | Status: DC | PRN
  Administered 2015-11-13: 08:00:00 via INTRAVENOUS

## 2015-11-13 MED ORDER — BUPIVACAINE HCL (PF) 0.5 % IJ SOLN
INTRAMUSCULAR | Status: AC
  Filled 2015-11-13: qty 30

## 2015-11-13 MED ORDER — MIDAZOLAM HCL 2 MG/2ML IJ SOLN
INTRAMUSCULAR | Status: AC
  Filled 2015-11-13: qty 2

## 2015-11-13 MED ORDER — ACETAMINOPHEN 325 MG PO TABS: 650.0000 mg | ORAL_TABLET | Freq: Four times a day (QID) | ORAL | Status: DC | PRN

## 2015-11-13 SURGICAL SUPPLY — 27 items
BAG HAMPER (MISCELLANEOUS) ×3 IMPLANT
BNDG CONFORM 2 STRL LF (GAUZE/BANDAGES/DRESSINGS) ×3 IMPLANT
CLOTH BEACON ORANGE TIMEOUT ST (SAFETY) ×3 IMPLANT
COVER LIGHT HANDLE STERIS (MISCELLANEOUS) ×6 IMPLANT
ELECT REM PT RETURN 9FT ADLT (ELECTROSURGICAL) ×3
ELECTRODE REM PT RTRN 9FT ADLT (ELECTROSURGICAL) ×1 IMPLANT
GAUZE PACKING 2X5 YD STRL (GAUZE/BANDAGES/DRESSINGS) ×2 IMPLANT
GAUZE SPONGE 4X4 12PLY STRL (GAUZE/BANDAGES/DRESSINGS) IMPLANT
GLOVE BIOGEL M STRL SZ7.5 (GLOVE) ×3 IMPLANT
GLOVE BIOGEL PI IND STRL 7.0 (GLOVE) ×1 IMPLANT
GLOVE BIOGEL PI INDICATOR 7.0 (GLOVE) ×2
GLOVE SURG SS PI 7.5 STRL IVOR (GLOVE) ×3 IMPLANT
GOWN STRL REUS W/TWL LRG LVL3 (GOWN DISPOSABLE) ×6 IMPLANT
KIT ROOM TURNOVER APOR (KITS) ×3 IMPLANT
MANIFOLD NEPTUNE II (INSTRUMENTS) ×3 IMPLANT
MARKER SKIN DUAL TIP RULER LAB (MISCELLANEOUS) ×3 IMPLANT
NS IRRIG 1000ML POUR BTL (IV SOLUTION) ×3 IMPLANT
PACK BASIC LIMB (CUSTOM PROCEDURE TRAY) IMPLANT
PACK MINOR (CUSTOM PROCEDURE TRAY) ×3 IMPLANT
PAD ABD 5X9 TENDERSORB (GAUZE/BANDAGES/DRESSINGS) ×2 IMPLANT
PAD ARMBOARD 7.5X6 YLW CONV (MISCELLANEOUS) ×3 IMPLANT
SET BASIN LINEN APH (SET/KITS/TRAYS/PACK) ×3 IMPLANT
SPONGE GAUZE 4X4 12PLY (GAUZE/BANDAGES/DRESSINGS) ×2 IMPLANT
SWAB CULTURE LIQ STUART DBL (MISCELLANEOUS) ×2 IMPLANT
SYR BULB IRRIGATION 50ML (SYRINGE) ×3 IMPLANT
TAPE CLOTH SURG 4X10 WHT LF (GAUZE/BANDAGES/DRESSINGS) ×2 IMPLANT
TUBE ANAEROBIC PORT A CUL  W/M (MISCELLANEOUS) ×2 IMPLANT

## 2015-11-13 NOTE — Transfer of Care (Signed)
Immediate Anesthesia Transfer of Care Note  Patient: Jeffrey Trujillo  Procedure(s) Performed: Procedure(s): INCISION AND DRAINAGE ABSCESS (N/A)  Patient Location: PACU  Anesthesia Type:General  Level of Consciousness: awake  Airway & Oxygen Therapy: Patient Spontanous Breathing and Patient connected to face mask oxygen  Post-op Assessment: Report given to RN  Post vital signs: Reviewed and stable  Last Vitals:  Filed Vitals:   11/13/15 0223 11/13/15 0453  BP: 129/60 147/74  Pulse: 69 62  Temp: 37.3 C 36.9 C  Resp: 20 18    Complications: No apparent anesthesia complications

## 2015-11-13 NOTE — Anesthesia Preprocedure Evaluation (Addendum)
Anesthesia Evaluation  Patient identified by MRN, date of birth, ID band Patient awake    Reviewed: Allergy & Precautions, NPO status , Patient's Chart, lab work & pertinent test results  History of Anesthesia Complications Negative for: history of anesthetic complications  Airway Mallampati: II  TM Distance: >3 FB     Dental  (+) Teeth Intact   Pulmonary neg pulmonary ROS, former smoker,  LLL removed, trauma   breath sounds clear to auscultation       Cardiovascular Exercise Tolerance: Good  Rhythm:Regular Rate:Normal     Neuro/Psych    GI/Hepatic GERD  Controlled and Medicated,  Endo/Other    Renal/GU      Musculoskeletal   Abdominal   Peds  Hematology  (+) anemia ,   Anesthesia Other Findings   Reproductive/Obstetrics                           Anesthesia Physical Anesthesia Plan  ASA: III  Anesthesia Plan: General   Post-op Pain Management:    Induction: Intravenous  Airway Management Planned: LMA  Additional Equipment:   Intra-op Plan:   Post-operative Plan:   Informed Consent: I have reviewed the patients History and Physical, chart, labs and discussed the procedure including the risks, benefits and alternatives for the proposed anesthesia with the patient or authorized representative who has indicated his/her understanding and acceptance.     Plan Discussed with: Surgeon  Anesthesia Plan Comments:         Anesthesia Quick Evaluation

## 2015-11-13 NOTE — Progress Notes (Signed)
[name]  Jeffrey Trujillo [id number] DOB: [birth year] DOA: 11/12/2015 PCP: [name], MD  Assessment/Plan: Perirectal abscess -S/p OR 2/26. -Op cx pending. -Continue zosyn. -DC vanc. -Pain control as needed.  RCC -F/u with oncology as an OP.  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 -Hb with appropriate response to transfusion of 1 unit of PrBCs on 2/25. -Hb is 9.2.  Code Status: Full Code Family Communication: patient only  Disposition Plan: home when ready   Consultants:  .Surgery   Antibiotics:  Zosyn   Subjective: Still in pain; a little groggy s/p anesthesia  Objective: Filed Vitals:   11/13/15 0915 11/13/15 0921 11/13/15 0927 11/13/15 0949  BP: 160/77   158/81  Pulse: 87 73 83   Temp:   98.6 F (37 C) 97.9 F (36.6 C)  TempSrc:      Resp: 23 19 17 20   Height:      Weight:      SpO2: 99% 95% 93% 99%    Intake/Output Summary (Last 24 hours) at 11/13/15 1145 Last data filed at 11/13/15 1035  Gross per 24 hour  Intake   1134 ml  Output   1401 ml  Net   -267 ml   Filed Weights   11/12/15 0913 11/12/15 1621  Weight: 74.39 kg (164 lb) 77.8 kg (171 lb 8.3 oz)    Exam:   General:  AA Ox3  Cardiovascular: RRR  Respiratory: CTA B  Abdomen: S/NT/ND/+BS  Extremities: no C/C/E   Neurologic:  Grossly intact and non-focal  Data Reviewed: Basic Metabolic Panel:  Recent Labs Lab 11/12/15 1037 11/12/15 1039 11/13/15 0647  NA  --  135 134*  K  --  2.9* 4.9  CL  --  103 108  CO2  --  22 19*  GLUCOSE  --  79 111*  BUN  --  32* 28*  CREATININE  --  1.49* 1.53*  CALCIUM  --  8.8* 8.2*  MG 1.5*  --   --    Liver Function Tests:  Recent Labs Lab 11/12/15 1039  AST 40  ALT 28  ALKPHOS 100  BILITOT 0.7  PROT 6.5  ALBUMIN 2.5*   No results for input(s): LIPASE, AMYLASE in the last 168 hours. No results for input(s): AMMONIA in the last 168  hours. CBC:  Recent Labs Lab 11/12/15 1039 11/13/15 0647  WBC 7.4 6.5  NEUTROABS 6.8  --   HGB 8.8* 9.2*  HCT 26.6* 28.1*  MCV 108.1* 105.2*  PLT 205 198   Cardiac Enzymes: No results for input(s): CKTOTAL, CKMB, CKMBINDEX, TROPONINI in the last 168 hours. BNP (last 3 results)  Recent Labs  01/03/15 1043  BNP 57.0    ProBNP (last 3 results) No results for input(s): PROBNP in the last 8760 hours.  CBG: No results for input(s): GLUCAP in the last 168 hours.  Recent Results (from the past 240 hour(s))  Blood culture (routine x 2)     Status: None (Preliminary result)   Collection Time: 11/12/15 11:50 AM  Result Value Ref Range Status   Specimen Description RIGHT ANTECUBITAL  Final   Special Requests BOTTLES DRAWN AEROBIC AND ANAEROBIC 8CC EACH  Final   Culture PENDING  Incomplete   Report Status PENDING  Incomplete  Blood culture (routine x 2)     Status: None (Preliminary result)   Collection Time: 11/12/15 12:00 PM  Result Value Ref Range Status   Specimen Description BLOOD  LEFT HAND  Final   Special Requests BOTTLES DRAWN AEROBIC AND ANAEROBIC 8CC EACH  Final   Culture PENDING  Incomplete   Report Status PENDING  Incomplete  Surgical pcr screen     Status: None   Collection Time: 11/12/15  8:54 PM  Result Value Ref Range Status   MRSA, PCR NEGATIVE NEGATIVE Final   Staphylococcus aureus NEGATIVE NEGATIVE Final    Comment:        The Xpert SA Assay (FDA approved for NASAL specimens in patients over 49 years of age), is one component of a comprehensive surveillance program.  Test performance has been validated by [facility] for patients greater than or equal to 17 year old. It is not intended to diagnose infection nor to guide or monitor treatment.      Studies: Ct Abdomen Pelvis Wo Contrast  11/12/2015  CLINICAL DATA:  65 year old male with abdominal and back pain and rectal pain and fullness. EXAM: CT ABDOMEN AND PELVIS WITHOUT CONTRAST TECHNIQUE:  Multidetector CT imaging of the abdomen and pelvis was performed following the standard protocol without IV contrast. COMPARISON:  09/13/2015 and prior CTs FINDINGS: Please note that parenchymal abnormalities may be missed without intravenous contrast. Lower chest:  A trace left pleural effusion is noted. Hepatobiliary: The liver and gallbladder are unremarkable. There is no evidence of biliary dilatation. Pancreas: Unremarkable Spleen: Upper limits of normal sized Adrenals/Urinary Tract: A mass within the upper left kidney is grossly unchanged. An upper pole cyst is again noted. Patient is status post right nephrectomy. The adrenal glands and bladder are unchanged. Stomach/Bowel: A 4 x 2.3 x 10.5 cm (AP x transverse x CC) right perirectal abscess is identified and extends from the of the mid-distal rectum to the inner right gluteal region. Focal right rectal wall thickening is identified (image 85). There is no evidence of bowel obstruction, pneumoperitoneum or other definite bowel wall thickening. Vascular/Lymphatic: No evidence of abdominal aortic aneurysm or enlarged lymph nodes. Reproductive: Mild prostate enlargement noted. Other: No free fluid. Musculoskeletal: No acute abnormalities identified. IMPRESSION: 4 x 2.8 x 10.5 cm right perirectal abscess with focal right rectal wall thickening. Grossly unchanged left upper renal mass. Trace left pleural effusion. Electronically Signed   By: Margarette Canada M.D.   On: 11/12/2015 10:52   Dg Chest 2 View  11/12/2015  CLINICAL DATA:  Rectal cancer, unspecified laterality.  Back pain. EXAM: CHEST  2 VIEW COMPARISON:  01/03/2015 FINDINGS: Chronic blunting at the left costophrenic angle. Old deformity of the left sixth rib. Lungs are clear without airspace disease or pulmonary edema. Heart and mediastinum are stable and within normal limits. No pleural effusions. IMPRESSION: No active cardiopulmonary disease.- Electronically Signed   By: Markus Daft M.D.   On: 11/12/2015  14:20   Dg Chest Port 1 View  11/13/2015  CLINICAL DATA:  PICC placement. EXAM: PORTABLE CHEST 1 VIEW COMPARISON:  Earlier this day at 1402 hour FINDINGS: Right-sided central line, likely from an internal jugular approach, tip in the mid SVC. No pneumothorax. The cardiomediastinal contours are normal. Chronic blunting of left costophrenic angle. Minimal right basilar atelectasis. Pulmonary vasculature is normal. No consolidation, pleural effusion, or pneumothorax. No acute osseous abnormalities are seen. Left posterior rib deformity again seen. IMPRESSION: Tip of the right central line in the mid SVC.  No pneumothorax. Electronically Signed   By: Jeb Levering M.D.   On: 11/13/2015 01:05    Scheduled Meds: . piperacillin-tazobactam (ZOSYN)  IV  3.375 g Intravenous Q8H  .  topiramate  25 mg Oral BID  . vancomycin  750 mg Intravenous Q12H   Continuous Infusions: . sodium chloride 75 mL/hr at 11/13/15 [id number]    Principal Problem:   Perirectal abscess Active Problems:   Renal cell cancer (HCC)   Hypokalemia   Anemia of chronic disease    Time spent: 25 minutes. Greater than 50% of this time was spent in direct contact with the patient coordinating care.    Lelon Frohlich  [facility] Pager [phone]  If 7PM-7AM, please contact night-coverage at [email or web address], password [facility] 11/13/2015, 11:45 AM  LOS: 1 day

## 2015-11-13 NOTE — Anesthesia Postprocedure Evaluation (Signed)
Anesthesia Post Note  Patient: Jeffrey Trujillo  Procedure(s) Performed: Procedure(s) (LRB): INCISION AND DRAINAGE ABSCESS (N/A)  Patient location during evaluation: PACU Anesthesia Type: General Level of consciousness: awake and alert and oriented Pain management: pain level controlled Vital Signs Assessment: post-procedure vital signs reviewed and stable Respiratory status: spontaneous breathing Cardiovascular status: blood pressure returned to baseline Postop Assessment: no signs of nausea or vomiting Anesthetic complications: no    Last Vitals:  Filed Vitals:   11/13/15 0900 11/13/15 0915  BP: 161/78   Pulse: 83 87  Temp:    Resp: 19 23    Last Pain:  Filed Vitals:   11/13/15 0917  PainSc: 8                  Michaelina Blandino

## 2015-11-13 NOTE — Anesthesia Procedure Notes (Signed)
Procedure Name: LMA Insertion Date/Time: 11/13/2015 8:18 AM Performed by: Tressie Stalker E Pre-anesthesia Checklist: Patient identified, Patient being monitored, Emergency Drugs available, Timeout performed and Suction available Patient Re-evaluated:Patient Re-evaluated prior to inductionOxygen Delivery Method: Circle System Utilized Preoxygenation: Pre-oxygenation with 100% oxygen Intubation Type: IV induction Ventilation: Mask ventilation without difficulty LMA: LMA inserted LMA Size: 4.0 Number of attempts: 1 Placement Confirmation: positive ETCO2 and breath sounds checked- equal and bilateral

## 2015-11-13 NOTE — Op Note (Signed)
Patient:  Jeffrey Trujillo  DOB:  1951/01/29  MRN:  [id number]   Preop Diagnosis:  Perirectal abscess  Postop Diagnosis:  Same  Procedure:  Incision and drainage of perirectal abscess  Surgeon:  Aviva Signs, M.D.  Anes:  Gen.  Indications:  Patient is a 65 year old white male with multiple medical problems including metastatic renal cell carcinoma presents with a perirectal abscess. The risks and benefits of procedure were fully explained to the patient, who gave informed consent.  Procedure note:  The patient was placed in the lithotomy position after general anesthesia was administered. The perineum was prepped and draped using the usual sterile technique with Betadine. Surgical site confirmation was performed.  The patient had a right perirectal abscess. Incision was made and purulent sanguinous fluid was found. This extended up high into the rectum. This was an ischiorectal abscess. There was no direct connection to the rectum. No stool was within the abscess cavity. Any loculations were digitally broken up. The wound was irrigated normal saline. Aerobic and anaerobic cultures were taken and sent to microbiology. Any bleeding was controlled using Bovie cautery. The wound was packed with Betadine impregnated gauze. A dry sterile dressing was then applied.  All tape and needle counts were correct the end of the procedure. Patient was awakened and transferred to PACU in stable condition.  Complications:  None  EBL:  Minimal  Specimen:  Aerobic and anaerobic cultures, perirectal abscess

## 2015-11-13 NOTE — Progress Notes (Signed)
Pt's temp 101.2 prior to blood being started. Discussed with Dr. Myna Hidalgo, will proceed with blood transfusion and continue to monitor pt.

## 2015-11-14 LAB — BASIC METABOLIC PANEL
Anion gap: 7 (ref 5–15)
BUN: 26 mg/dL — ABNORMAL HIGH (ref 6–20)
CALCIUM: 8.2 mg/dL — AB (ref 8.9–10.3)
CO2: 21 mmol/L — AB (ref 22–32)
CREATININE: 1.63 mg/dL — AB (ref 0.61–1.24)
Chloride: 106 mmol/L (ref 101–111)
GFR calc non Af Amer: 43 mL/min — ABNORMAL LOW (ref >60)
GFR, EST AFRICAN AMERICAN: 50 mL/min — AB (ref >60)
Glucose, Bld: 104 mg/dL — ABNORMAL HIGH (ref 65–99)
Potassium: 4.8 mmol/L (ref 3.5–5.1)
SODIUM: 134 mmol/L — AB (ref 135–145)

## 2015-11-14 LAB — MAGNESIUM: MAGNESIUM: 1.9 mg/dL (ref 1.7–2.4)

## 2015-11-14 LAB — CBC
HCT: 27.6 % — ABNORMAL LOW (ref 39.0–52.0)
Hemoglobin: 9 g/dL — ABNORMAL LOW (ref 13.0–17.0)
MCH: 34.6 pg — ABNORMAL HIGH (ref 26.0–34.0)
MCHC: 32.6 g/dL (ref 30.0–36.0)
MCV: 106.2 fL — ABNORMAL HIGH (ref 78.0–100.0)
PLATELETS: 253 10*3/uL (ref 150–400)
RBC: 2.6 MIL/uL — ABNORMAL LOW (ref 4.22–5.81)
RDW: 22.4 % — AB (ref 11.5–15.5)
WBC: 7.6 10*3/uL (ref 4.0–10.5)

## 2015-11-14 MED ORDER — ENSURE ENLIVE PO LIQD
237.0000 mL | Freq: Two times a day (BID) | ORAL | Status: DC
  Administered 2015-11-14 – 2015-11-17 (×6): 237 mL via ORAL

## 2015-11-14 NOTE — H&P (Signed)
Nutrition Brief Note  Patient identified on the Malnutrition Screening Tool (MST) Report  Pt has hx of renal cell carcinoma and is now being treated due to right perirectal abscess. He is s/p incision and drainage 2/26. Cultures obtained. His weight hx below is stable between 164-172# as far back as fall of 2014. He says he's not eating very well right now due to being in pain with his back. He drinks Ensure at home and has for some time per pt. He prefers vanilla. Will add supplements while he is here due to her current poor po meal intake.  Wt Readings from Last 15 Encounters:  11/12/15 171 lb 8.3 oz (77.8 kg)  11/09/15 164 lb (74.39 kg)  09/16/15 174 lb 9.6 oz (79.198 kg)  08/10/15 171 lb 6.4 oz (77.747 kg)  07/23/15 170 lb (77.111 kg)  07/01/15 174 lb 3.2 oz (79.017 kg)  05/27/15 166 lb 4.8 oz (75.433 kg)  05/13/15 163 lb 11.2 oz (74.254 kg)  04/15/15 169 lb (76.658 kg)  02/23/15 164 lb 11.2 oz (74.707 kg)  02/11/15 166 lb 10.7 oz (75.6 kg)  01/20/15 171 lb 12.8 oz (77.928 kg)  01/17/15 172 lb (78.019 kg)  01/03/15 168 lb (76.204 kg)  12/07/14 168 lb 1.6 oz (76.25 kg)    Body mass index is 23.93 kg/(m^2). Patient meets criteria for  based on current BMI.   Labs and medications reviewed. His sodium is 134 (low), BUN-26 and creat 1.63 (elevated). His wound culture finding- gram negative rods.  Colman Cater MS,RD,CSG,LDN Office: [phone] Pager: [phone]

## 2015-11-14 NOTE — Clinical Documentation Improvement (Signed)
Internal Medicine  The medical record includes the following unapproved, unclear or unknown abbreviation: AOCD   AOCD  -Due to [place].  -Hb with appropriate response to transfusion of 1 unit of PrBCs on 2/25.  -Hb is 9.2.   Acute blood loss anemia  Acute on chronic blood loss anemia  Other Condition???  Unable to determine  Please document the associated medical diagnosis related to the above abbreviation.  Please exercise your independent, professional judgment when responding. A specific answer is not anticipated or expected.  Thank You, Zoila Shutter RN, BSN, [place] [phone]; Cell: [phone]

## 2015-11-14 NOTE — Progress Notes (Signed)
[name]  Jeffrey Trujillo [id number] DOB: 05/03/1951 DOA: 11/12/2015 PCP: [name], MD  Assessment/Plan: Perirectal abscess -S/p OR 2/26. -Op cx pending. -Continue zosyn. -DC vanc. -Pain control as needed.  RCC -F/u with oncology as an OP.  Hypokalemia -Replaced  Hypomagnesemia -replaced  Anemia of Chronic Disease -Due to RCC. -Hb with appropriate response to transfusion of 1 unit of PrBCs on 2/25. -Hb is 9.0.  Code Status: Full Code Family Communication: patient only  Disposition Plan: home when ready, likely in 24 hours   Consultants:  .Surgery   Antibiotics:  Zosyn   Subjective: Still with pain  Objective: Filed Vitals:   11/14/15 0021 11/14/15 0656 11/14/15 0938 11/14/15 1320  BP: 135/84 115/62  160/86  Pulse: 89 84  91  Temp: 98.2 F (36.8 C) 97.8 F (36.6 C)  99 F (37.2 C)  TempSrc: Oral Oral  Oral  Resp: 18 20  18   Height:      Weight:      SpO2: 99% 96% 97% 100%    Intake/Output Summary (Last 24 hours) at 11/14/15 1544 Last data filed at 11/14/15 1215  Gross per 24 hour  Intake   2985 ml  Output   2050 ml  Net    935 ml   Filed Weights   11/12/15 0913 11/12/15 1621  Weight: 74.39 kg (164 lb) 77.8 kg (171 lb 8.3 oz)    Exam:   General:  AA Ox3  Cardiovascular: RRR  Respiratory: CTA B  Abdomen: S/NT/ND/+BS  Extremities: no C/C/E   Neurologic:  Grossly intact and non-focal  Data Reviewed: Basic Metabolic Panel:  Recent Labs Lab 11/12/15 1037 11/12/15 1039 11/13/15 0647 11/14/15 0546  NA  --  135 134* 134*  K  --  2.9* 4.9 4.8  CL  --  103 108 106  CO2  --  22 19* 21*  GLUCOSE  --  79 111* 104*  BUN  --  32* 28* 26*  CREATININE  --  1.49* 1.53* 1.63*  CALCIUM  --  8.8* 8.2* 8.2*  MG 1.5*  --   --  1.9   Liver Function Tests:  Recent Labs Lab 11/12/15 1039  AST 40  ALT 28  ALKPHOS 100  BILITOT 0.7  PROT 6.5  ALBUMIN 2.5*   No results for input(s): LIPASE,  AMYLASE in the last 168 hours. No results for input(s): AMMONIA in the last 168 hours. CBC:  Recent Labs Lab 11/12/15 1039 11/13/15 0647 11/14/15 0546  WBC 7.4 6.5 7.6  NEUTROABS 6.8  --   --   HGB 8.8* 9.2* 9.0*  HCT 26.6* 28.1* 27.6*  MCV 108.1* 105.2* 106.2*  PLT 205 198 253   Cardiac Enzymes: No results for input(s): CKTOTAL, CKMB, CKMBINDEX, TROPONINI in the last 168 hours. BNP (last 3 results)  Recent Labs  01/03/15 1043  BNP 57.0    ProBNP (last 3 results) No results for input(s): PROBNP in the last 8760 hours.  CBG: No results for input(s): GLUCAP in the last 168 hours.  Recent Results (from the past 240 hour(s))  Blood culture (routine x 2)     Status: None (Preliminary result)   Collection Time: 11/12/15 11:50 AM  Result Value Ref Range Status   Specimen Description RIGHT ANTECUBITAL  Final   Special Requests BOTTLES DRAWN AEROBIC AND ANAEROBIC 8CC EACH  Final   Culture NO GROWTH 2 DAYS  Final   Report Status PENDING  Incomplete  Blood culture (routine  x 2)     Status: None (Preliminary result)   Collection Time: 11/12/15 12:00 PM  Result Value Ref Range Status   Specimen Description BLOOD LEFT HAND  Final   Special Requests BOTTLES DRAWN AEROBIC AND ANAEROBIC 8CC EACH  Final   Culture NO GROWTH 2 DAYS  Final   Report Status PENDING  Incomplete  Surgical pcr screen     Status: None   Collection Time: 11/12/15  8:54 PM  Result Value Ref Range Status   MRSA, PCR NEGATIVE NEGATIVE Final   Staphylococcus aureus NEGATIVE NEGATIVE Final    Comment:        The Xpert SA Assay (FDA approved for NASAL specimens in patients over 66 years of age), is one component of a comprehensive surveillance program.  Test performance has been validated by [facility] for patients greater than or equal to 41 year old. It is not intended to diagnose infection nor to guide or monitor treatment.   Culture, routine-abscess     Status: None (Preliminary result)    Collection Time: 11/13/15  9:00 AM  Result Value Ref Range Status   Specimen Description WOUND  Final   Special Requests NONE  Final   Gram Stain   Final    MODERATE WBC PRESENT,BOTH PMN AND MONONUCLEAR NO SQUAMOUS EPITHELIAL CELLS SEEN FEW GRAM POSITIVE COCCI IN CLUSTERS FEW GRAM NEGATIVE RODS FEW GRAM POSITIVE RODS Performed at [facility]    Culture   Final    Culture reincubated for better growth Performed at [facility]    Report Status PENDING  Incomplete     Studies: Dg Chest Port 1 View  11/13/2015  CLINICAL DATA:  PICC placement. EXAM: PORTABLE CHEST 1 VIEW COMPARISON:  Earlier this day at 1402 hour FINDINGS: Right-sided central line, likely from an internal jugular approach, tip in the mid SVC. No pneumothorax. The cardiomediastinal contours are normal. Chronic blunting of left costophrenic angle. Minimal right basilar atelectasis. Pulmonary vasculature is normal. No consolidation, pleural effusion, or pneumothorax. No acute osseous abnormalities are seen. Left posterior rib deformity again seen. IMPRESSION: Tip of the right central line in the mid SVC.  No pneumothorax. Electronically Signed   By: Jeb Levering M.D.   On: 11/13/2015 01:05    Scheduled Meds: . feeding supplement (ENSURE ENLIVE)  237 mL Oral BID BM  . piperacillin-tazobactam (ZOSYN)  IV  3.375 g Intravenous Q8H  . topiramate  25 mg Oral BID   Continuous Infusions: . sodium chloride 75 mL/hr at 11/14/15 0415    Principal Problem:   Perirectal abscess Active Problems:   Renal cell cancer (HCC)   Hypokalemia   Anemia of chronic disease    Time spent: 25 minutes. Greater than 50% of this time was spent in direct contact with the patient coordinating care.    Lelon Frohlich  [facility] Pager [phone]  If 7PM-7AM, please contact night-coverage at [email or web address], password [facility] 11/14/2015, 3:44 PM  LOS: 2 days

## 2015-11-14 NOTE — Addendum Note (Signed)
Addendum  created 11/14/15 0809 by Ollen Bowl, CRNA   Modules edited: Clinical Notes   Clinical Notes:  File: [id number]

## 2015-11-14 NOTE — Care Management Note (Signed)
Case Management Note  Patient Details  Name: XANE MARUSKA MRN: [id number] Date of Birth: [birth year]  Subjective/Objective:        Spoke with patient and spouse for discharge planning. Patient is from home with spouse. Spouse stated that he uses a walker at home to ambulate. Offered patient choice of providers and  No preference. Patient is form [place] [facility] and uses [facility] for PCP.  Anticipate the need for home   Health RN for continued wound care and possible PT due to debilitation. PT consult placed.       Action/Plan:  Home with Home Health.  Expected Discharge Date:                  Expected Discharge Plan:  [place]  In-House Referral:     Discharge planning Services  CM Consult  Post Acute Care Choice:    Choice offered to:     DME Arranged:    DME Agency:     HH Arranged:  RN, PT [place] Agency:     Status of Service:  In process, will continue to follow  Medicare Important Message Given:    Date Medicare IM Given:    Medicare IM give by:    Date Additional Medicare IM Given:    Additional Medicare Important Message give by:     If discussed at [place] of Stay Meetings, dates discussed:    Additional Comments:  Alvie Heidelberg, RN 11/14/2015, 11:15 AM

## 2015-11-14 NOTE — Progress Notes (Signed)
1 Day Post-Op  Subjective: Having pain at incision site.  Objective: Vital signs in last 24 hours: Temp:  [97.7 F (36.5 C)-98.5 F (36.9 C)] 97.8 F (36.6 C) (02/27 0656) Pulse Rate:  [63-89] 84 (02/27 0656) Resp:  [18-20] 20 (02/27 0656) BP: (115-158)/(59-84) 115/62 mmHg (02/27 0656) SpO2:  [96 %-100 %] 97 % (02/27 0938) Last BM Date: 11/13/15  Intake/Output from previous day: 02/26 0701 - 02/27 0700 In: 1060 [P.O.:360; I.V.:700] Out: 1450 [Urine:1200; Blood:250] Intake/Output this shift: Total I/O In: 2985 [I.V.:2735; IV Piggyback:250] Out: 400 [Urine:400]  General appearance: alert, cooperative and moderate distress Pelvic: Rectal packing removed.  Lab Results:   Recent Labs  11/13/15 0647 11/14/15 0546  WBC 6.5 7.6  HGB 9.2* 9.0*  HCT 28.1* 27.6*  PLT 198 253   BMET  Recent Labs  11/13/15 0647 11/14/15 0546  NA 134* 134*  K 4.9 4.8  CL 108 106  CO2 19* 21*  GLUCOSE 111* 104*  BUN 28* 26*  CREATININE 1.53* 1.63*  CALCIUM 8.2* 8.2*   PT/INR  Recent Labs  11/12/15 1039  LABPROT 15.3*  INR 1.19    Studies/Results: Ct Abdomen Pelvis Wo Contrast  11/12/2015  CLINICAL DATA:  65 year old male with abdominal and back pain and rectal pain and fullness. EXAM: CT ABDOMEN AND PELVIS WITHOUT CONTRAST TECHNIQUE: Multidetector CT imaging of the abdomen and pelvis was performed following the standard protocol without IV contrast. COMPARISON:  09/13/2015 and prior CTs FINDINGS: Please note that parenchymal abnormalities may be missed without intravenous contrast. Lower chest:  A trace left pleural effusion is noted. Hepatobiliary: The liver and gallbladder are unremarkable. There is no evidence of biliary dilatation. Pancreas: Unremarkable Spleen: Upper limits of normal sized Adrenals/Urinary Tract: A mass within the upper left kidney is grossly unchanged. An upper pole cyst is again noted. Patient is status post right nephrectomy. The adrenal glands and bladder  are unchanged. Stomach/Bowel: A 4 x 2.3 x 10.5 cm (AP x transverse x CC) right perirectal abscess is identified and extends from the of the mid-distal rectum to the inner right gluteal region. Focal right rectal wall thickening is identified (image 85). There is no evidence of bowel obstruction, pneumoperitoneum or other definite bowel wall thickening. Vascular/Lymphatic: No evidence of abdominal aortic aneurysm or enlarged lymph nodes. Reproductive: Mild prostate enlargement noted. Other: No free fluid. Musculoskeletal: No acute abnormalities identified. IMPRESSION: 4 x 2.8 x 10.5 cm right perirectal abscess with focal right rectal wall thickening. Grossly unchanged left upper renal mass. Trace left pleural effusion. Electronically Signed   By: Margarette Canada M.D.   On: 11/12/2015 10:52   Dg Chest 2 View  11/12/2015  CLINICAL DATA:  Rectal cancer, unspecified laterality.  Back pain. EXAM: CHEST  2 VIEW COMPARISON:  01/03/2015 FINDINGS: Chronic blunting at the left costophrenic angle. Old deformity of the left sixth rib. Lungs are clear without airspace disease or pulmonary edema. Heart and mediastinum are stable and within normal limits. No pleural effusions. IMPRESSION: No active cardiopulmonary disease.- Electronically Signed   By: Markus Daft M.D.   On: 11/12/2015 14:20   Dg Chest Port 1 View  11/13/2015  CLINICAL DATA:  PICC placement. EXAM: PORTABLE CHEST 1 VIEW COMPARISON:  Earlier this day at 1402 hour FINDINGS: Right-sided central line, likely from an internal jugular approach, tip in the mid SVC. No pneumothorax. The cardiomediastinal contours are normal. Chronic blunting of left costophrenic angle. Minimal right basilar atelectasis. Pulmonary vasculature is normal. No consolidation, pleural effusion, or  pneumothorax. No acute osseous abnormalities are seen. Left posterior rib deformity again seen. IMPRESSION: Tip of the right central line in the mid SVC.  No pneumothorax. Electronically Signed   By:  Jeb Levering M.D.   On: 11/13/2015 01:05    Anti-infectives: Anti-infectives    Start     Dose/Rate Route Frequency Ordered Stop   11/13/15 0600  vancomycin (VANCOCIN) IVPB 750 mg/150 ml premix  Status:  Discontinued     750 mg 150 mL/hr over 60 Minutes Intravenous Every 12 hours 11/12/15 1954 11/13/15 1152   11/12/15 2200  piperacillin-tazobactam (ZOSYN) IVPB 3.375 g     3.375 g 12.5 mL/hr over 240 Minutes Intravenous Every 8 hours 11/12/15 1952     11/12/15 2030  vancomycin (VANCOCIN) 1,500 mg in sodium chloride 0.9 % 500 mL IVPB     1,500 mg 250 mL/hr over 120 Minutes Intravenous  Once 11/12/15 1952 11/12/15 2259   11/12/15 1430  vancomycin (VANCOCIN) IVPB 1000 mg/200 mL premix  Status:  Discontinued     1,000 mg 200 mL/hr over 60 Minutes Intravenous  Once 11/12/15 1428 11/12/15 1435   11/12/15 1300  ciprofloxacin (CIPRO) IVPB 400 mg     400 mg 200 mL/hr over 60 Minutes Intravenous  Once 11/12/15 1259 11/12/15 1555   11/12/15 1300  metroNIDAZOLE (FLAGYL) IVPB 500 mg     500 mg 100 mL/hr over 60 Minutes Intravenous  Once 11/12/15 1259 11/12/15 1434      Assessment/Plan: s/p Procedure(s): INCISION AND DRAINAGE ABSCESS Impression: Stable postoperatively. Is still having significant perirectal pain which is not unexpected. Hopefully this will clear with removing the packing. Will try to start sitz baths. Culture results pending.  LOS: 2 days    Lysha Schrade A 11/14/2015

## 2015-11-14 NOTE — Anesthesia Postprocedure Evaluation (Signed)
Anesthesia Post Note  Patient: CADENCE MCLAMB  Procedure(s) Performed: Procedure(s) (LRB): INCISION AND DRAINAGE ABSCESS (N/A)  Patient location during evaluation: Nursing Unit Anesthesia Type: General Level of consciousness: awake and alert Pain management: pain level not controlled (Presently waiting for pain med, nurse notified ) Vital Signs Assessment: post-procedure vital signs reviewed and stable Respiratory status: spontaneous breathing Cardiovascular status: blood pressure returned to baseline Postop Assessment: no signs of nausea or vomiting Anesthetic complications: no    Last Vitals:  Filed Vitals:   11/14/15 0021 11/14/15 0656  BP: 135/84 115/62  Pulse: 89 84  Temp: 36.8 C 36.6 C  Resp: 18 20    Last Pain:  Filed Vitals:   11/14/15 0713  PainSc: 6                  Nataleah Scioneaux

## 2015-11-15 ENCOUNTER — Encounter (HOSPITAL_COMMUNITY): Payer: Self-pay | Admitting: General Surgery

## 2015-11-15 DIAGNOSIS — M109 Gout, unspecified: Secondary | ICD-10-CM

## 2015-11-15 LAB — BASIC METABOLIC PANEL
Anion gap: 6 (ref 5–15)
BUN: 27 mg/dL — AB (ref 6–20)
CALCIUM: 8.4 mg/dL — AB (ref 8.9–10.3)
CHLORIDE: 104 mmol/L (ref 101–111)
CO2: 24 mmol/L (ref 22–32)
CREATININE: 1.75 mg/dL — AB (ref 0.61–1.24)
GFR calc non Af Amer: 39 mL/min — ABNORMAL LOW (ref >60)
GFR, EST AFRICAN AMERICAN: 46 mL/min — AB (ref >60)
Glucose, Bld: 107 mg/dL — ABNORMAL HIGH (ref 65–99)
Potassium: 4.7 mmol/L (ref 3.5–5.1)
SODIUM: 134 mmol/L — AB (ref 135–145)

## 2015-11-15 LAB — CBC
HCT: 24 % — ABNORMAL LOW (ref 39.0–52.0)
HEMOGLOBIN: 7.9 g/dL — AB (ref 13.0–17.0)
MCH: 35.1 pg — AB (ref 26.0–34.0)
MCHC: 32.9 g/dL (ref 30.0–36.0)
MCV: 106.7 fL — ABNORMAL HIGH (ref 78.0–100.0)
Platelets: 281 10*3/uL (ref 150–400)
RBC: 2.25 MIL/uL — ABNORMAL LOW (ref 4.22–5.81)
RDW: 21.7 % — AB (ref 11.5–15.5)
WBC: 5.3 10*3/uL (ref 4.0–10.5)

## 2015-11-15 MED ORDER — PREDNISONE 10 MG PO TABS
10.0000 mg | ORAL_TABLET | Freq: Three times a day (TID) | ORAL | Status: AC
  Administered 2015-11-16 (×3): 10 mg via ORAL
  Filled 2015-11-15 (×3): qty 1

## 2015-11-15 MED ORDER — PREDNISONE 10 MG (21) PO TBPK: 20.0000 mg | ORAL_TABLET | Freq: Every evening | ORAL | Status: DC

## 2015-11-15 MED ORDER — PREDNISONE 20 MG PO TABS
20.0000 mg | ORAL_TABLET | Freq: Every day | ORAL | Status: AC
  Administered 2015-11-15: 20 mg via ORAL
  Filled 2015-11-15: qty 1

## 2015-11-15 MED ORDER — PREDNISONE 10 MG PO TABS
10.0000 mg | ORAL_TABLET | Freq: Two times a day (BID) | ORAL | Status: AC
  Administered 2015-11-15 (×2): 10 mg via ORAL
  Filled 2015-11-15 (×2): qty 1

## 2015-11-15 MED ORDER — PREDNISONE 20 MG PO TABS
20.0000 mg | ORAL_TABLET | Freq: Once | ORAL | Status: AC
  Administered 2015-11-15: 20 mg via ORAL
  Filled 2015-11-15: qty 1

## 2015-11-15 MED ORDER — PREDNISONE 20 MG PO TABS
20.0000 mg | ORAL_TABLET | Freq: Every day | ORAL | Status: AC
  Administered 2015-11-16: 20 mg via ORAL
  Filled 2015-11-15: qty 1

## 2015-11-15 MED ORDER — PREDNISONE 10 MG (21) PO TBPK: 10.0000 mg | ORAL_TABLET | Freq: Three times a day (TID) | ORAL | Status: DC

## 2015-11-15 MED ORDER — PREDNISONE 10 MG (21) PO TBPK: 10.0000 mg | ORAL_TABLET | ORAL | Status: DC

## 2015-11-15 MED ORDER — PREDNISONE 10 MG (21) PO TBPK: 10.0000 mg | ORAL_TABLET | Freq: Four times a day (QID) | ORAL | Status: DC

## 2015-11-15 MED ORDER — PREDNISONE 10 MG (21) PO TBPK
20.0000 mg | ORAL_TABLET | Freq: Every morning | ORAL | Status: DC
  Filled 2015-11-15: qty 21

## 2015-11-15 MED ORDER — PREDNISONE 10 MG PO TABS: 10.0000 mg | ORAL_TABLET | Freq: Four times a day (QID) | ORAL | Status: DC

## 2015-11-15 MED ORDER — POLYETHYLENE GLYCOL 3350 17 G PO PACK
17.0000 g | PACK | Freq: Two times a day (BID) | ORAL | Status: DC
  Administered 2015-11-15: 17 g via ORAL

## 2015-11-15 NOTE — Progress Notes (Signed)
[name]  DASHEEM BRAFF [id number] DOB: [birth year] DOA: 11/12/2015 PCP: [name], MD  Assessment/Plan: Perirectal abscess -S/p OR 2/26. -Op cx with multiple organisms, none predominant. -Continue zosyn. -Pain control as needed.  Acute Gout Flare -Will start steroid taper. -Joints affected: right 1st MTP joint, left 1st PIP joint.  RCC -F/u with oncology as an OP.  Hypokalemia -Replaced  Hypomagnesemia -replaced  Anemia of Chronic Disease -Due to RCC. -Hb with appropriate response to transfusion of 1 unit of PrBCs on 2/25. -Hb has decreased to 7.9. Follow and transfuse for Hb <7.  Code Status: Full Code Family Communication: Wife at bedside updated on plan of care and all questions answered. Disposition Plan: home when ready, likely in 24 hours   Consultants:  .Surgery   Antibiotics:  Zosyn   Subjective: Still with pain of the rectal area, also of left 1st PIP and right 1st MTP joints.  Objective: Filed Vitals:   11/14/15 1836 11/14/15 2125 11/15/15 0630 11/15/15 1023  BP: 133/62 128/67 156/71   Pulse: 76 80 89   Temp: 98.4 F (36.9 C) 97.9 F (36.6 C) 98.1 F (36.7 C)   TempSrc: Oral Oral Oral   Resp: 20 20 20    Height:      Weight:      SpO2: 99% 99% 97% 98%    Intake/Output Summary (Last 24 hours) at 11/15/15 1458 Last data filed at 11/15/15 1200  Gross per 24 hour  Intake    360 ml  Output   1400 ml  Net  -1040 ml   Filed Weights   11/12/15 0913 11/12/15 1621  Weight: 74.39 kg (164 lb) 77.8 kg (171 lb 8.3 oz)    Exam:   General:  AA Ox3  Cardiovascular: RRR  Respiratory: CTA B  Abdomen: S/NT/ND/+BS  Extremities: no C/C/E   Neurologic:  Grossly intact and non-focal  Data Reviewed: Basic Metabolic Panel:  Recent Labs Lab 11/12/15 1037 11/12/15 1039 11/13/15 0647 11/14/15 0546 11/15/15 0559  NA  --  135 134* 134* 134*  K  --  2.9* 4.9 4.8 4.7  CL  --  103 108 106 104  CO2  --   22 19* 21* 24  GLUCOSE  --  79 111* 104* 107*  BUN  --  32* 28* 26* 27*  CREATININE  --  1.49* 1.53* 1.63* 1.75*  CALCIUM  --  8.8* 8.2* 8.2* 8.4*  MG 1.5*  --   --  1.9  --    Liver Function Tests:  Recent Labs Lab 11/12/15 1039  AST 40  ALT 28  ALKPHOS 100  BILITOT 0.7  PROT 6.5  ALBUMIN 2.5*   No results for input(s): LIPASE, AMYLASE in the last 168 hours. No results for input(s): AMMONIA in the last 168 hours. CBC:  Recent Labs Lab 11/12/15 1039 11/13/15 0647 11/14/15 0546 11/15/15 0559  WBC 7.4 6.5 7.6 5.3  NEUTROABS 6.8  --   --   --   HGB 8.8* 9.2* 9.0* 7.9*  HCT 26.6* 28.1* 27.6* 24.0*  MCV 108.1* 105.2* 106.2* 106.7*  PLT 205 198 253 281   Cardiac Enzymes: No results for input(s): CKTOTAL, CKMB, CKMBINDEX, TROPONINI in the last 168 hours. BNP (last 3 results)  Recent Labs  01/03/15 1043  BNP 57.0    ProBNP (last 3 results) No results for input(s): PROBNP in the last 8760 hours.  CBG: No results for input(s): GLUCAP in the last 168 hours.  Recent Results (  from the past 240 hour(s))  Blood culture (routine x 2)     Status: None (Preliminary result)   Collection Time: 11/12/15 11:50 AM  Result Value Ref Range Status   Specimen Description RIGHT ANTECUBITAL  Final   Special Requests BOTTLES DRAWN AEROBIC AND ANAEROBIC 8CC EACH  Final   Culture NO GROWTH 3 DAYS  Final   Report Status PENDING  Incomplete  Blood culture (routine x 2)     Status: None (Preliminary result)   Collection Time: 11/12/15 12:00 PM  Result Value Ref Range Status   Specimen Description BLOOD LEFT HAND  Final   Special Requests BOTTLES DRAWN AEROBIC AND ANAEROBIC 8CC EACH  Final   Culture NO GROWTH 3 DAYS  Final   Report Status PENDING  Incomplete  Surgical pcr screen     Status: None   Collection Time: 11/12/15  8:54 PM  Result Value Ref Range Status   MRSA, PCR NEGATIVE NEGATIVE Final   Staphylococcus aureus NEGATIVE NEGATIVE Final    Comment:        The Xpert SA  Assay (FDA approved for NASAL specimens in patients over 1 years of age), is one component of a comprehensive surveillance program.  Test performance has been validated by [facility] for patients greater than or equal to 60 year old. It is not intended to diagnose infection nor to guide or monitor treatment.   Anaerobic culture     Status: None (Preliminary result)   Collection Time: 11/13/15  9:00 AM  Result Value Ref Range Status   Specimen Description ABSCESS PERIRECTAL  Final   Special Requests ZOSYN  IMMUNE:COMPROMISED  Final   Gram Stain   Final    NO WBC SEEN NO SQUAMOUS EPITHELIAL CELLS SEEN NO ORGANISMS SEEN Performed at [facility]    Culture   Final    NO ANAEROBES ISOLATED; CULTURE IN PROGRESS FOR 5 DAYS Performed at [facility]    Report Status PENDING  Incomplete  Culture, routine-abscess     Status: None (Preliminary result)   Collection Time: 11/13/15  9:00 AM  Result Value Ref Range Status   Specimen Description ABSCESS PERIRECTAL  Final   Special Requests ZOSYN  IMMUNE:COMPROMISED  Final   Gram Stain   Final    MODERATE WBC PRESENT,BOTH PMN AND MONONUCLEAR NO SQUAMOUS EPITHELIAL CELLS SEEN FEW GRAM POSITIVE COCCI IN CLUSTERS FEW GRAM NEGATIVE RODS FEW GRAM POSITIVE RODS Performed at [facility]    Culture   Final    MULTIPLE ORGANISMS PRESENT, NONE PREDOMINANT Performed at [facility]    Report Status PENDING  Incomplete     Studies: No results found.  Scheduled Meds: . feeding supplement (ENSURE ENLIVE)  237 mL Oral BID BM  . piperacillin-tazobactam (ZOSYN)  IV  3.375 g Intravenous Q8H  . polyethylene glycol  17 g Oral BID  . predniSONE  10 mg Oral BID WC   Followed by  . predniSONE  20 mg Oral QHS   Followed by  . [START ON 11/16/2015] predniSONE  10 mg Oral TID WC   Followed by  . [START ON 11/16/2015] predniSONE  20 mg Oral QHS   Followed by  . [START ON 11/18/2015] predniSONE  10 mg Oral 4X daily  taper  . topiramate  25 mg Oral BID   Continuous Infusions: . sodium chloride 75 mL/hr at 11/14/15 1810    Principal Problem:   Perirectal abscess Active Problems:   Renal cell cancer ([place])  Hypokalemia   Anemia of chronic disease    Time spent: 25 minutes. Greater than 50% of this time was spent in direct contact with the patient coordinating care.    Lelon Frohlich  [facility] Pager [phone]  If 7PM-7AM, please contact night-coverage at [email or web address], password [facility] 11/15/2015, 2:58 PM  LOS: 3 days

## 2015-11-15 NOTE — Progress Notes (Signed)
2 Days Post-Op  Subjective: Patient states his perirectal pain is somewhat improved. Still having some drainage.  Objective: Vital signs in last 24 hours: Temp:  [97.9 F (36.6 C)-99 F (37.2 C)] 98.1 F (36.7 C) (02/28 0630) Pulse Rate:  [76-91] 89 (02/28 0630) Resp:  [18-20] 20 (02/28 0630) BP: (128-160)/(62-86) 156/71 mmHg (02/28 0630) SpO2:  [97 %-100 %] 97 % (02/28 0630) Last BM Date: 11/14/15  Intake/Output from previous day: 02/27 0701 - 02/28 0700 In: 3225 [P.O.:240; I.V.:2735; IV Piggyback:250] Out: 2000 [Urine:2000] Intake/Output this shift: Total I/O In: 0  Out: 800 [Urine:800]  General appearance: alert, cooperative and mild distress Pelvic: Decreased induration noted. Still with some serosanguineous drainage.  Lab Results:   Recent Labs  11/14/15 0546 11/15/15 0559  WBC 7.6 5.3  HGB 9.0* 7.9*  HCT 27.6* 24.0*  PLT 253 281   BMET  Recent Labs  11/14/15 0546 11/15/15 0559  NA 134* 134*  K 4.8 4.7  CL 106 104  CO2 21* 24  GLUCOSE 104* 107*  BUN 26* 27*  CREATININE 1.63* 1.75*  CALCIUM 8.2* 8.4*   PT/INR  Recent Labs  11/12/15 1039  LABPROT 15.3*  INR 1.19    Studies/Results: No results found.  Anti-infectives: Anti-infectives    Start     Dose/Rate Route Frequency Ordered Stop   11/13/15 0600  vancomycin (VANCOCIN) IVPB 750 mg/150 ml premix  Status:  Discontinued     750 mg 150 mL/hr over 60 Minutes Intravenous Every 12 hours 11/12/15 1954 11/13/15 1152   11/12/15 2200  piperacillin-tazobactam (ZOSYN) IVPB 3.375 g     3.375 g 12.5 mL/hr over 240 Minutes Intravenous Every 8 hours 11/12/15 1952     11/12/15 2030  vancomycin (VANCOCIN) 1,500 mg in sodium chloride 0.9 % 500 mL IVPB     1,500 mg 250 mL/hr over 120 Minutes Intravenous  Once 11/12/15 1952 11/12/15 2259   11/12/15 1430  vancomycin (VANCOCIN) IVPB 1000 mg/200 mL premix  Status:  Discontinued     1,000 mg 200 mL/hr over 60 Minutes Intravenous  Once 11/12/15 1428 11/12/15  1435   11/12/15 1300  ciprofloxacin (CIPRO) IVPB 400 mg     400 mg 200 mL/hr over 60 Minutes Intravenous  Once 11/12/15 1259 11/12/15 1555   11/12/15 1300  metroNIDAZOLE (FLAGYL) IVPB 500 mg     500 mg 100 mL/hr over 60 Minutes Intravenous  Once 11/12/15 1259 11/12/15 1434      Assessment/Plan: s/p Procedure(s): INCISION AND DRAINAGE ABSCESS Impression: Status post incision and drainage of ischiorectal abscess. Cultures intraoperatively show multiple organisms.  Plan: Continue current therapy. Will add MiraLAX as patient states he is constipated.  LOS: 3 days    Jayme Cham A 11/15/2015

## 2015-11-15 NOTE — Progress Notes (Signed)
PT Cancellation Note  Patient Details Name: Jeffrey Trujillo MRN: [id number] DOB: 11/26/1950   Cancelled Treatment:    Reason Eval/Treat Not Completed: Pain limiting ability to participate;Patient declined, no reason specified; Pt declined PT at this time secondary to pain. PT will attempt to see pt again at a later time.      10:48 AM,11/15/2015 Elly Modena PT, [facility] [phone]

## 2015-11-15 NOTE — Progress Notes (Signed)
PT Cancellation Note  Patient Details Name: Jeffrey Trujillo MRN: [id number] DOB: Sep 26, 1950   Cancelled Treatment:    Reason Eval/Treat Not Completed: Pain limiting ability to participate;Patient declined, no reason specified Pt unwilling to participate in PT. Therapist discussed importance of getting up and out of bed to prevent weakness, risk of DVT and other negative side effects. Pt understanding and requested PT try again later. RN was notified.     1:56 PM,11/15/2015 Elly Modena PT, DPT [facility] [phone]

## 2015-11-15 NOTE — Care Management Note (Signed)
Case Management Note  Patient Details  Name: ANSELMO STEGER MRN: [id number] Date of Birth: [birth year]  Subjective/Objective:       Continuing to follow patient  For discharge needs. Anticipate home health need. More to follow.          Action/Plan:  Home with Home Health.  Expected Discharge Date:                  Expected Discharge Plan:  [place]  In-House Referral:     Discharge planning Services  CM Consult  Post Acute Care Choice:    Choice offered to:     DME Arranged:    DME Agency:     HH Arranged:  RN, PT [place] Agency:     Status of Service:  In process, will continue to follow  Medicare Important Message Given:    Date Medicare IM Given:    Medicare IM give by:    Date Additional Medicare IM Given:    Additional Medicare Important Message give by:     If discussed at [place] of Stay Meetings, dates discussed:    Additional Comments:  Alvie Heidelberg, RN 11/15/2015, 5:03 PM

## 2015-11-15 NOTE — Progress Notes (Addendum)
Unable to complete sitz bath order per [name] because sitz bath kits not stocked at this location.  Unable to use bed pan as a sitz bath.  Cleaned and flushed perirectal surgical site after pt. had a loose BM.  Left message with Butch Penny to order sitz bath kits per charge nurse Demetrio Lapping.

## 2015-11-16 DIAGNOSIS — K611 Rectal abscess: Secondary | ICD-10-CM

## 2015-11-16 LAB — TYPE AND SCREEN
ABO/RH(D): B NEG
Antibody Screen: NEGATIVE
UNIT DIVISION: 0
Unit division: 0

## 2015-11-16 LAB — CULTURE, ROUTINE-ABSCESS

## 2015-11-16 MED ORDER — METRONIDAZOLE 500 MG PO TABS
500.0000 mg | ORAL_TABLET | Freq: Three times a day (TID) | ORAL | Status: DC
  Administered 2015-11-16 – 2015-11-18 (×6): 500 mg via ORAL
  Filled 2015-11-16 (×6): qty 1

## 2015-11-16 MED ORDER — CIPROFLOXACIN HCL 250 MG PO TABS
500.0000 mg | ORAL_TABLET | Freq: Two times a day (BID) | ORAL | Status: DC
  Administered 2015-11-16 – 2015-11-17 (×2): 500 mg via ORAL
  Filled 2015-11-16 (×2): qty 2

## 2015-11-16 NOTE — Progress Notes (Signed)
[name]  Jeffrey Trujillo [id number] DOB: 1951/09/06 DOA: 11/12/2015 PCP: [name], MD  Assessment/Plan: Perirectal abscess -S/p OR 2/26. -Op cx with multiple organisms, none predominant. -Zosyn--> transitioned to PO Cipro/Flagyl 11/16/15-stop date 3.7.17 -Pain control as needed.  Acute Gout Flare -Will start steroid taper. -Joints affected: right 1st MTP joint, left 1st PIP joint.  Renal cell Ca -F/u with oncology as an OP.  Hypokalemia -Replaced  Hypomagnesemia -replaced  Anemia of Chronic Disease -Due to RCC. -Hb with appropriate response to transfusion of 1 unit of PrBCs on 2/25. -rpt CBC am  Code Status: Full Code Family Communication: no family + bedside Disposition Plan: home in 24 hours   Consultants:  .Surgery   Antibiotics:  Zosyn   Subjective:  Pain in rectum Also diarrhea 2/2 to miralax No n/v Cannot sit-up without help No n/v tol diet  Objective: Filed Vitals:   11/15/15 1517 11/15/15 2100 11/16/15 0513 11/16/15 1314  BP: 148/61 142/65 160/74 118/63  Pulse: 72 75 58 62  Temp: 98.7 F (37.1 C) 98.2 F (36.8 C) 98 F (36.7 C) 98.1 F (36.7 C)  TempSrc: Oral Oral Oral Oral  Resp: 20 20 20 20   Height:      Weight:      SpO2: 97% 98% 100% 100%    Intake/Output Summary (Last 24 hours) at 11/16/15 1452 Last data filed at 11/16/15 1200  Gross per 24 hour  Intake    360 ml  Output   2850 ml  Net  -2490 ml   Filed Weights   11/12/15 0913 11/12/15 1621  Weight: 74.39 kg (164 lb) 77.8 kg (171 lb 8.3 oz)    Exam:   General:  AA Ox3  Cardiovascular: RRR  Respiratory: CTA B  Abdomen: S/NT/ND/+BS  Extremities: no C/C/E   Neurologic:  Grossly intact and non-focal  Data Reviewed: Basic Metabolic Panel:  Recent Labs Lab 11/12/15 1037 11/12/15 1039 11/13/15 0647 11/14/15 0546 11/15/15 0559  NA  --  135 134* 134* 134*  K  --  2.9* 4.9 4.8 4.7  CL  --  103 108 106 104  CO2  --  22  19* 21* 24  GLUCOSE  --  79 111* 104* 107*  BUN  --  32* 28* 26* 27*  CREATININE  --  1.49* 1.53* 1.63* 1.75*  CALCIUM  --  8.8* 8.2* 8.2* 8.4*  MG 1.5*  --   --  1.9  --    Liver Function Tests:  Recent Labs Lab 11/12/15 1039  AST 40  ALT 28  ALKPHOS 100  BILITOT 0.7  PROT 6.5  ALBUMIN 2.5*   No results for input(s): LIPASE, AMYLASE in the last 168 hours. No results for input(s): AMMONIA in the last 168 hours. CBC:  Recent Labs Lab 11/12/15 1039 11/13/15 0647 11/14/15 0546 11/15/15 0559  WBC 7.4 6.5 7.6 5.3  NEUTROABS 6.8  --   --   --   HGB 8.8* 9.2* 9.0* 7.9*  HCT 26.6* 28.1* 27.6* 24.0*  MCV 108.1* 105.2* 106.2* 106.7*  PLT 205 198 253 281   Cardiac Enzymes: No results for input(s): CKTOTAL, CKMB, CKMBINDEX, TROPONINI in the last 168 hours. BNP (last 3 results)  Recent Labs  01/03/15 1043  BNP 57.0    ProBNP (last 3 results) No results for input(s): PROBNP in the last 8760 hours.  CBG: No results for input(s): GLUCAP in the last 168 hours.  Recent Results (from the past 240 hour(s))  Blood culture (routine x 2)     Status: None (Preliminary result)   Collection Time: 11/12/15 11:50 AM  Result Value Ref Range Status   Specimen Description BLOOD RIGHT ANTECUBITAL  Final   Special Requests BOTTLES DRAWN AEROBIC AND ANAEROBIC 8CC EACH  Final   Culture NO GROWTH 4 DAYS  Final   Report Status PENDING  Incomplete  Blood culture (routine x 2)     Status: None (Preliminary result)   Collection Time: 11/12/15 12:00 PM  Result Value Ref Range Status   Specimen Description BLOOD LEFT HAND  Final   Special Requests BOTTLES DRAWN AEROBIC AND ANAEROBIC 8CC EACH  Final   Culture NO GROWTH 4 DAYS  Final   Report Status PENDING  Incomplete  Surgical pcr screen     Status: None   Collection Time: 11/12/15  8:54 PM  Result Value Ref Range Status   MRSA, PCR NEGATIVE NEGATIVE Final   Staphylococcus aureus NEGATIVE NEGATIVE Final    Comment:        The Xpert SA  Assay (FDA approved for NASAL specimens in patients over 26 years of age), is one component of a comprehensive surveillance program.  Test performance has been validated by [facility] for patients greater than or equal to 71 year old. It is not intended to diagnose infection nor to guide or monitor treatment.   Anaerobic culture     Status: None (Preliminary result)   Collection Time: 11/13/15  9:00 AM  Result Value Ref Range Status   Specimen Description ABSCESS PERIRECTAL  Final   Special Requests ZOSYN  IMMUNE:COMPROMISED  Final   Gram Stain   Final    NO WBC SEEN NO SQUAMOUS EPITHELIAL CELLS SEEN NO ORGANISMS SEEN Performed at [facility]    Culture   Final    NO ANAEROBES ISOLATED; CULTURE IN PROGRESS FOR 5 DAYS Performed at [facility]    Report Status PENDING  Incomplete  Culture, routine-abscess     Status: None   Collection Time: 11/13/15  9:00 AM  Result Value Ref Range Status   Specimen Description ABSCESS PERIRECTAL  Final   Special Requests ZOSYN  IMMUNE:COMPROMISED  Final   Gram Stain   Final    MODERATE WBC PRESENT,BOTH PMN AND MONONUCLEAR NO SQUAMOUS EPITHELIAL CELLS SEEN FEW GRAM POSITIVE COCCI IN CLUSTERS FEW GRAM NEGATIVE RODS FEW GRAM POSITIVE RODS Performed at [facility]    Culture   Final    MULTIPLE ORGANISMS PRESENT, NONE PREDOMINANT Note: NO STAPHYLOCOCCUS AUREUS ISOLATED NO GROUP A STREP (S.PYOGENES) ISOLATED Performed at [facility]    Report Status 11/16/2015 FINAL  Final     Studies: No results found.  Scheduled Meds: . ciprofloxacin  500 mg Oral BID  . feeding supplement (ENSURE ENLIVE)  237 mL Oral BID BM  . metroNIDAZOLE  500 mg Oral 3 times per day  . predniSONE  10 mg Oral TID WC   Followed by  . predniSONE  20 mg Oral QHS   Followed by  . [START ON 11/18/2015] predniSONE  10 mg Oral 4X daily taper  . topiramate  25 mg Oral BID   Continuous Infusions: . sodium chloride 75 mL/hr at  11/16/15 1117    Principal Problem:   Perirectal abscess Active Problems:   Renal cell cancer ([facility])   Hypokalemia   Anemia of chronic disease   Acute gout    Time spent: 25 minutes. Greater than 50% of this time was  spent in direct contact with the patient coordinating care.   Verneita Griffes, MD [facility] [phone]

## 2015-11-16 NOTE — Progress Notes (Signed)
3 Days Post-Op  Subjective: Patient continues to complain of pain, though it has decreased.  Objective: Vital signs in last 24 hours: Temp:  [98 F (36.7 C)-98.7 F (37.1 C)] 98 F (36.7 C) (03/01 0513) Pulse Rate:  [58-75] 58 (03/01 0513) Resp:  [20] 20 (03/01 0513) BP: (142-160)/(61-74) 160/74 mmHg (03/01 0513) SpO2:  [97 %-100 %] 100 % (03/01 0513) Last BM Date: 11/16/15  Intake/Output from previous day: 02/28 0701 - 03/01 0700 In: 240 [P.O.:240] Out: 3250 [Urine:3250] Intake/Output this shift: Total I/O In: 120 [P.O.:120] Out: -   General appearance: alert, cooperative and no distress Pelvic: Minimal drainage from incision site. No induration present.  Lab Results:   Recent Labs  11/14/15 0546 11/15/15 0559  WBC 7.6 5.3  HGB 9.0* 7.9*  HCT 27.6* 24.0*  PLT 253 281   BMET  Recent Labs  11/14/15 0546 11/15/15 0559  NA 134* 134*  K 4.8 4.7  CL 106 104  CO2 21* 24  GLUCOSE 104* 107*  BUN 26* 27*  CREATININE 1.63* 1.75*  CALCIUM 8.2* 8.4*   PT/INR No results for input(s): LABPROT, INR in the last 72 hours.  Studies/Results: No results found.  Anti-infectives: Anti-infectives    Start     Dose/Rate Route Frequency Ordered Stop   11/13/15 0600  vancomycin (VANCOCIN) IVPB 750 mg/150 ml premix  Status:  Discontinued     750 mg 150 mL/hr over 60 Minutes Intravenous Every 12 hours 11/12/15 1954 11/13/15 1152   11/12/15 2200  piperacillin-tazobactam (ZOSYN) IVPB 3.375 g     3.375 g 12.5 mL/hr over 240 Minutes Intravenous Every 8 hours 11/12/15 1952     11/12/15 2030  vancomycin (VANCOCIN) 1,500 mg in sodium chloride 0.9 % 500 mL IVPB     1,500 mg 250 mL/hr over 120 Minutes Intravenous  Once 11/12/15 1952 11/12/15 2259   11/12/15 1430  vancomycin (VANCOCIN) IVPB 1000 mg/200 mL premix  Status:  Discontinued     1,000 mg 200 mL/hr over 60 Minutes Intravenous  Once 11/12/15 1428 11/12/15 1435   11/12/15 1300  ciprofloxacin (CIPRO) IVPB 400 mg     400  mg 200 mL/hr over 60 Minutes Intravenous  Once 11/12/15 1259 11/12/15 1555   11/12/15 1300  metroNIDAZOLE (FLAGYL) IVPB 500 mg     500 mg 100 mL/hr over 60 Minutes Intravenous  Once 11/12/15 1259 11/12/15 1434      Assessment/Plan: s/p Procedure(s): INCISION AND DRAINAGE ABSCESS Impression: Resolving perirectal abscess  Plan: Continue current therapy. Okay for discharge from surgery standpoint. Will stop MiraLAX as patient has been having diarrhea. Will need 2 weeks total of ciprofloxacin and Flagyl antibiotics.  LOS: 4 days    Loreal Schuessler A 11/16/2015

## 2015-11-16 NOTE — Evaluation (Signed)
Physical Therapy Evaluation Patient Details Name: Jeffrey Trujillo MRN: [id number] DOB: 26-Apr-1951 Today's Date: 11/16/2015   History of Present Illness  Pt is a 65 yo M admitted to [facility] being treated due to right perirectal abscess. He is s/p incision and drainage 2/26. PMH includes gout, cervical DJD, anemia, CA, and GERD  Clinical Impression  Pt found in his bed with wife present. He was willing to cooperate with PT and stated he is feeling much better since yesterday. He is A&Ox3. Strength is WFL, seemingly limited due to pain in his buttock region. He requires minimal physical assistance performing bed mobility and transfers, requiring increased time to complete the task. He reported dizziness upon standing which resolved after several minutes. His ambulation is below his baseline, requiring CGA to MinA to prevent LOB backwards. His independence with gait training is limited due to his fair balance as well as his breath holding during activity due to pain. He would benefit from skilled PT while at [facility] to address his limitations. He would also benefit from supervision for mobility and OOB activity as well at [place] upon D/C to address remaining functional limitations.      Follow Up Recommendations Home health PT;Supervision for mobility/OOB    Equipment Recommendations  Rolling walker with 5" wheels    Recommendations for Other Services       Precautions / Restrictions Restrictions Weight Bearing Restrictions: No      Mobility  Bed Mobility Overal bed mobility: Modified Independent             General bed mobility comments: Increased time, HOB elevated  Transfers Overall transfer level: Needs assistance Equipment used: Rolling walker (2 wheeled) Transfers: Sit to/from Stand Sit to Stand: Min assist         General transfer comment: Pt with poor use of RW, therapist educated pt on proper/safe way to stand from EOB with RW.  Ambulation/Gait Ambulation/Gait assistance:  Min guard;Min assist (CGA when ambulating forward; MinA when ambulating backwards.) Ambulation Distance (Feet): 15 Feet Assistive device: Rolling walker (2 wheeled) Gait Pattern/deviations: Decreased step length - left;Decreased step length - right;Decreased stride length;Trunk flexed;Wide base of support   Gait velocity interpretation: <1.8 ft/sec, indicative of risk for recurrent falls General Gait Details: Pt with increase thoracic kyphosis corrected with verbal cues. Pt reporting his feet are "on fire" during activity  Stairs            Wheelchair Mobility    Modified Rankin (Stroke Patients Only)       Balance Overall balance assessment: Needs assistance Sitting-balance support: No upper extremity supported Sitting balance-Leahy Scale: Good     Standing balance support: Bilateral upper extremity supported Standing balance-Leahy Scale: Fair               High level balance activites: Backward walking;Side stepping High Level Balance Comments: Pt side stepping at EOB initially with CGA; pt ambulating bacwards with RW 3x5' with MinA to prevent LOB backwards.              Pertinent Vitals/Pain Pain Assessment: 0-10 Pain Score: 4  Pain Location: buttock area Pain Descriptors / Indicators: Sore Pain Intervention(s): Monitored during session    Home Living Family/patient expects to be discharged to:: Private residence Living Arrangements: Spouse/significant other Available Help at Discharge: Family Type of Home: House Home Access: Stairs to enter   Technical brewer of Steps: 1 Home Layout: One level Home Equipment: Walker - standard (currently using his sister in laws walker  for the past couple of days)      Prior Function Level of Independence: Independent with assistive device(s)               Hand Dominance        Extremity/Trunk Assessment   Upper Extremity Assessment: Overall WFL for tasks assessed           Lower  Extremity Assessment: Overall WFL for tasks assessed         Communication   Communication: No difficulties  Cognition Arousal/Alertness: Awake/alert Behavior During Therapy: WFL for tasks assessed/performed Overall Cognitive Status: Within Functional Limits for tasks assessed                      General Comments      Exercises General Exercises - Lower Extremity Ankle Circles/Pumps: AROM;Both;5 reps;Supine Quad Sets: AROM;Both;5 reps;Supine      Assessment/Plan    PT Assessment Patient needs continued PT services  PT Diagnosis Difficulty walking;Abnormality of gait;Generalized weakness   PT Problem List Decreased strength;Decreased activity tolerance;Decreased balance;Decreased mobility;Decreased knowledge of use of DME;Decreased safety awareness;Pain  PT Treatment Interventions Gait training;DME instruction;Functional mobility training;Stair training;Therapeutic activities;Therapeutic exercise;Patient/family education;Balance training   PT Goals (Current goals can be found in the Care Plan section) Acute Rehab PT Goals Patient Stated Goal: return to PLOF PT Goal Formulation: With patient/family Time For Goal Achievement: 11/30/15 Potential to Achieve Goals: Good    Frequency Min 3X/week   Barriers to discharge        Co-evaluation               End of Session Equipment Utilized During Treatment: Gait belt Activity Tolerance: Patient limited by pain Patient left: in bed;with call bell/phone within reach;with family/visitor present           Time: 1010-1040 PT Time Calculation (min) (ACUTE ONLY): 30 min   Charges:   PT Evaluation $PT Eval Low Complexity: 1 Procedure PT Treatments $Therapeutic Activity: 23-37 mins   PT G Codes:          11:05 AM,November 28, 2015 Elly Modena PT, DPT [facility] Outpatient Physical Therapy [phone]

## 2015-11-17 LAB — CBC WITH DIFFERENTIAL/PLATELET
BASOS ABS: 0 10*3/uL (ref 0.0–0.1)
BASOS PCT: 0 %
EOS PCT: 0 %
Eosinophils Absolute: 0 10*3/uL (ref 0.0–0.7)
HEMATOCRIT: 23.7 % — AB (ref 39.0–52.0)
Hemoglobin: 7.8 g/dL — ABNORMAL LOW (ref 13.0–17.0)
LYMPHS PCT: 6 %
Lymphs Abs: 0.3 10*3/uL — ABNORMAL LOW (ref 0.7–4.0)
MCH: 35.1 pg — ABNORMAL HIGH (ref 26.0–34.0)
MCHC: 32.9 g/dL (ref 30.0–36.0)
MCV: 106.8 fL — AB (ref 78.0–100.0)
MONO ABS: 0.2 10*3/uL (ref 0.1–1.0)
Monocytes Relative: 4 %
NEUTROS ABS: 3.7 10*3/uL (ref 1.7–7.7)
Neutrophils Relative %: 90 %
PLATELETS: 269 10*3/uL (ref 150–400)
RBC: 2.22 MIL/uL — AB (ref 4.22–5.81)
RDW: 20.2 % — AB (ref 11.5–15.5)
WBC: 4.1 10*3/uL (ref 4.0–10.5)

## 2015-11-17 LAB — COMPREHENSIVE METABOLIC PANEL
ALBUMIN: 2.2 g/dL — AB (ref 3.5–5.0)
ALT: 14 U/L — AB (ref 17–63)
AST: 14 U/L — AB (ref 15–41)
Alkaline Phosphatase: 77 U/L (ref 38–126)
Anion gap: 8 (ref 5–15)
BUN: 33 mg/dL — AB (ref 6–20)
CHLORIDE: 104 mmol/L (ref 101–111)
CO2: 23 mmol/L (ref 22–32)
CREATININE: 1.56 mg/dL — AB (ref 0.61–1.24)
Calcium: 9 mg/dL (ref 8.9–10.3)
GFR calc Af Amer: 52 mL/min — ABNORMAL LOW (ref >60)
GFR calc non Af Amer: 45 mL/min — ABNORMAL LOW (ref >60)
GLUCOSE: 127 mg/dL — AB (ref 65–99)
POTASSIUM: 4.5 mmol/L (ref 3.5–5.1)
Sodium: 135 mmol/L (ref 135–145)
Total Bilirubin: 0.6 mg/dL (ref 0.3–1.2)
Total Protein: 6.3 g/dL — ABNORMAL LOW (ref 6.5–8.1)

## 2015-11-17 LAB — CULTURE, BLOOD (ROUTINE X 2)
Culture: NO GROWTH
Culture: NO GROWTH

## 2015-11-17 LAB — C DIFFICILE QUICK SCREEN W PCR REFLEX
C Diff antigen: POSITIVE — AB
C Diff toxin: NEGATIVE

## 2015-11-17 LAB — ANAEROBIC CULTURE: GRAM STAIN: NONE SEEN

## 2015-11-17 NOTE — Discharge Instructions (Signed)
Perirectal Abscess An abscess is an infected area that contains a collection of pus. A perirectal abscess is an abscess that is near the opening of the anus or around the rectum. A perirectal abscess can cause a lot of pain, especially during bowel movements. CAUSES This condition is almost always caused by an infection that starts in an anal gland. RISK FACTORS This condition is more likely to develop in:  People with diabetes or inflammatory bowel disease.  People whose body defense system (immune system) is weak.  People who have anal sex.  People who have a sexually transmitted disease (STD).  People who have certain kinds of cancers, such as rectal carcinoma, leukemia, or lymphoma. SYMPTOMS The main symptom of this condition is pain. The pain may be a throbbing pain that gets worse during bowel movements. Other symptoms include:  Fever.  Swelling.  Redness.  Bleeding.  Constipation. DIAGNOSIS The condition is diagnosed with a physical exam. If the abscess is not visible, a health care provider may need to place a finger inside the rectum to find the abscess. Sometimes, imaging tests are done to determine the size and location of the abscess. These tests may include:  An ultrasound.  An MRI.  A CT scan. TREATMENT This condition is usually treated with incision and drainage surgery. Incision and drainage surgery involves making an incision over the abscess to drain the pus. Treatment may also involve antibiotic medicine, pain medicine, stool softeners, or laxatives. HOME CARE INSTRUCTIONS  Take medicines only as directed by your health care provider.  If you were prescribed an antibiotic, finish all of it even if you start to feel better.  To relieve pain, try sitting:  In a warm, shallow bath (sitz bath).  On a heating pad with the setting on low.  On an inflatable donut-shaped cushion.  Follow any diet instructions as directed by your health care  provider.  Keep all follow-up visits as directed by your health care provider. This is important. SEEK MEDICAL CARE IF:  Your abscess is bleeding.  You have pain, swelling, or redness that is getting worse.  You are constipated.  You feel ill.  You have muscle aches or chills.  You have a fever.  Your symptoms return after the abscess has healed.   This information is not intended to replace advice given to you by your health care provider. Make sure you discuss any questions you have with your health care provider.   Document Released: 08/31/2000 Document Revised: 05/25/2015 Document Reviewed: 07/14/2014 Elsevier Interactive Patient Education 2016 [employer] Inc.  

## 2015-11-17 NOTE — Progress Notes (Signed)
Patient to follow-up in my office next week. Was given instructions on perianal care.

## 2015-11-17 NOTE — Progress Notes (Signed)
Patient having several loose BMs this AM.  Refuses to use bedpan or commode d/t pain when sitting on them - skin on sacrum and around wound is red and excoriated, MASD. Explained to patient that it is going to hurt either way, as stooling on himself is wearing down his skin.  Stool is getting in wound with each stool.  Attempted to dress with ABD pad, but stool still getting in wound regardless.  Barrier cream applied to sacrum and inside of thighs with each pad change.  Stool sent for C Diff.

## 2015-11-17 NOTE — Progress Notes (Signed)
[name]  Jeffrey Trujillo [id number] DOB: 1951/04/11 DOA: 11/12/2015 PCP: [name], MD  Assessment/Plan: ?CDIFF Cdiff AG + but toxin neg However, as is symptomatic, d/c Cipro Continue flagyl monotherapy Check cbc + diff am Will transition to PO vanc if no better Discontinue PO steroids on 3.2 that was started for gout Start back saline as a lot of diarrhea  Perirectal abscess -S/p OR 2/26. -Op cx with multiple organisms, none predominant. -Zosyn--> transitioned to PO Cipro/Flagyl 11/16/15-stop date 3.7.17 [see above discussion] -Pain control as needed.  Acute Gout Flare -Will start steroid taper-see above -Joints affected: right 1st MTP joint, left 1st PIP joint.  Renal cell Ca -F/u with oncology as an OP.  Hypokalemia -Replaced  Hypomagnesemia -replaced  Anemia of Chronic Disease -Due to RCC. -Hb with appropriate response to transfusion of 1 unit of PrBCs on 2/25. -rpt CBC am  Code Status: Full Code Family Communication: no family + bedside Disposition Plan: home in 24 hours   Consultants:  .Surgery   Antibiotics:  Zosyn   Subjective:  Diarrhea ++ Refusing to get OOB No n/v Some pain in rectum from surgery No fever nor chills  Objective: Filed Vitals:   11/16/15 2113 11/17/15 0430 11/17/15 1210 11/17/15 1300  BP: 119/73 145/79  108/58  Pulse: 63 60  68  Temp: 98.1 F (36.7 C) 97.5 F (36.4 C)  97.8 F (36.6 C)  TempSrc: Oral Oral  Oral  Resp: 21 18  17   Height:      Weight:      SpO2: 99% 100% 100% 100%    Intake/Output Summary (Last 24 hours) at 11/17/15 1527 Last data filed at 11/17/15 1100  Gross per 24 hour  Intake    480 ml  Output   2500 ml  Net  -2020 ml   Filed Weights   11/12/15 0913 11/12/15 1621  Weight: 74.39 kg (164 lb) 77.8 kg (171 lb 8.3 oz)    Exam:   General:  AA Ox3  Cardiovascular: RRR  Respiratory: CTA B  Abdomen: S/NT/ND/+BS--no rebound no gaurd  Extremities:  no C/C/E   Neurologic:  Grossly intact and non-focal  Data Reviewed: Basic Metabolic Panel:  Recent Labs Lab 11/12/15 1037 11/12/15 1039 11/13/15 0647 11/14/15 0546 11/15/15 0559 11/17/15 0545  NA  --  135 134* 134* 134* 135  K  --  2.9* 4.9 4.8 4.7 4.5  CL  --  103 108 106 104 104  CO2  --  22 19* 21* 24 23  GLUCOSE  --  79 111* 104* 107* 127*  BUN  --  32* 28* 26* 27* 33*  CREATININE  --  1.49* 1.53* 1.63* 1.75* 1.56*  CALCIUM  --  8.8* 8.2* 8.2* 8.4* 9.0  MG 1.5*  --   --  1.9  --   --    Liver Function Tests:  Recent Labs Lab 11/12/15 1039 11/17/15 0545  AST 40 14*  ALT 28 14*  ALKPHOS 100 77  BILITOT 0.7 0.6  PROT 6.5 6.3*  ALBUMIN 2.5* 2.2*   No results for input(s): LIPASE, AMYLASE in the last 168 hours. No results for input(s): AMMONIA in the last 168 hours. CBC:  Recent Labs Lab 11/12/15 1039 11/13/15 0647 11/14/15 0546 11/15/15 0559 11/17/15 0545  WBC 7.4 6.5 7.6 5.3 4.1  NEUTROABS 6.8  --   --   --  3.7  HGB 8.8* 9.2* 9.0* 7.9* 7.8*  HCT 26.6* 28.1* 27.6* 24.0* 23.7*  MCV 108.1* 105.2* 106.2* 106.7* 106.8*  PLT 205 198 253 281 269   Cardiac Enzymes: No results for input(s): CKTOTAL, CKMB, CKMBINDEX, TROPONINI in the last 168 hours. BNP (last 3 results)  Recent Labs  01/03/15 1043  BNP 57.0    ProBNP (last 3 results) No results for input(s): PROBNP in the last 8760 hours.  CBG: No results for input(s): GLUCAP in the last 168 hours.  Recent Results (from the past 240 hour(s))  Blood culture (routine x 2)     Status: None   Collection Time: 11/12/15 11:50 AM  Result Value Ref Range Status   Specimen Description BLOOD RIGHT ANTECUBITAL  Final   Special Requests BOTTLES DRAWN AEROBIC AND ANAEROBIC 8CC EACH  Final   Culture NO GROWTH 5 DAYS  Final   Report Status 11/17/2015 FINAL  Final  Blood culture (routine x 2)     Status: None   Collection Time: 11/12/15 12:00 PM  Result Value Ref Range Status   Specimen Description BLOOD  LEFT HAND  Final   Special Requests BOTTLES DRAWN AEROBIC AND ANAEROBIC 8CC EACH  Final   Culture NO GROWTH 5 DAYS  Final   Report Status 11/17/2015 FINAL  Final  Surgical pcr screen     Status: None   Collection Time: 11/12/15  8:54 PM  Result Value Ref Range Status   MRSA, PCR NEGATIVE NEGATIVE Final   Staphylococcus aureus NEGATIVE NEGATIVE Final    Comment:        The Xpert SA Assay (FDA approved for NASAL specimens in patients over 3 years of age), is one component of a comprehensive surveillance program.  Test performance has been validated by [facility] for patients greater than or equal to 18 year old. It is not intended to diagnose infection nor to guide or monitor treatment.   Anaerobic culture     Status: None   Collection Time: 11/13/15  9:00 AM  Result Value Ref Range Status   Specimen Description ABSCESS PERIRECTAL  Final   Special Requests ZOSYN  IMMUNE:COMPROMISED  Final   Gram Stain   Final    NO WBC SEEN NO SQUAMOUS EPITHELIAL CELLS SEEN NO ORGANISMS SEEN Performed at [facility]    Culture   Final    BACTEROIDES FRAGILIS Note: BETA LACTAMASE POSITIVE Performed at [facility]    Report Status 11/17/2015 FINAL  Final  Culture, routine-abscess     Status: None   Collection Time: 11/13/15  9:00 AM  Result Value Ref Range Status   Specimen Description ABSCESS PERIRECTAL  Final   Special Requests ZOSYN  IMMUNE:COMPROMISED  Final   Gram Stain   Final    MODERATE WBC PRESENT,BOTH PMN AND MONONUCLEAR NO SQUAMOUS EPITHELIAL CELLS SEEN FEW GRAM POSITIVE COCCI IN CLUSTERS FEW GRAM NEGATIVE RODS FEW GRAM POSITIVE RODS Performed at [facility]    Culture   Final    MULTIPLE ORGANISMS PRESENT, NONE PREDOMINANT Note: NO STAPHYLOCOCCUS AUREUS ISOLATED NO GROUP A STREP (S.PYOGENES) ISOLATED Performed at [facility]    Report Status 11/16/2015 FINAL  Final  C difficile quick scan w PCR reflex     Status: Abnormal    Collection Time: 11/17/15 10:15 AM  Result Value Ref Range Status   C Diff antigen POSITIVE (A) NEGATIVE Final   C Diff toxin NEGATIVE NEGATIVE Final   C Diff interpretation   Final    C. difficile present, but toxin not detected. This indicates colonization. In most cases,  this does not require treatment. If patient has signs and symptoms consistent with colitis, consider treatment. Requires ENTERIC precautions.     Studies: No results found.  Scheduled Meds: . feeding supplement (ENSURE ENLIVE)  237 mL Oral BID BM  . metroNIDAZOLE  500 mg Oral 3 times per day  . [START ON 11/18/2015] predniSONE  10 mg Oral 4X daily taper  . topiramate  25 mg Oral BID   Continuous Infusions:    Principal Problem:   Perirectal abscess Active Problems:   Renal cell cancer (HCC)   Hypokalemia   Anemia of chronic disease   Acute gout    Time spent: 25 minutes. Greater than 50% of this time was spent in direct contact with the patient coordinating care.   Verneita Griffes, MD [facility] [phone]

## 2015-11-18 ENCOUNTER — Other Ambulatory Visit: Payer: 59

## 2015-11-18 ENCOUNTER — Ambulatory Visit: Payer: BLUE CROSS/BLUE SHIELD | Admitting: Oncology

## 2015-11-18 LAB — BASIC METABOLIC PANEL
ANION GAP: 6 (ref 5–15)
BUN: 37 mg/dL — ABNORMAL HIGH (ref 6–20)
CO2: 25 mmol/L (ref 22–32)
Calcium: 9.1 mg/dL (ref 8.9–10.3)
Chloride: 105 mmol/L (ref 101–111)
Creatinine, Ser: 1.57 mg/dL — ABNORMAL HIGH (ref 0.61–1.24)
GFR calc Af Amer: 52 mL/min — ABNORMAL LOW (ref >60)
GFR, EST NON AFRICAN AMERICAN: 45 mL/min — AB (ref >60)
GLUCOSE: 84 mg/dL (ref 65–99)
POTASSIUM: 4 mmol/L (ref 3.5–5.1)
SODIUM: 136 mmol/L (ref 135–145)

## 2015-11-18 MED ORDER — OXYCODONE-ACETAMINOPHEN 5-325 MG PO TABS: 1.0000 | ORAL_TABLET | ORAL | Status: DC | PRN

## 2015-11-18 MED ORDER — ZOLPIDEM TARTRATE 5 MG PO TABS: 5.0000 mg | ORAL_TABLET | Freq: Every evening | ORAL | Status: DC | PRN

## 2015-11-18 MED ORDER — METRONIDAZOLE 500 MG PO TABS: 500.0000 mg | ORAL_TABLET | Freq: Three times a day (TID) | ORAL | Status: DC

## 2015-11-18 MED ORDER — DIPHENHYDRAMINE HCL 12.5 MG/5ML PO ELIX
12.5000 mg | ORAL_SOLUTION | Freq: Once | ORAL | Status: AC
  Administered 2015-11-18: 12.5 mg via ORAL
  Filled 2015-11-18: qty 5

## 2015-11-18 NOTE — Care Management Note (Signed)
Case Management Note  Patient Details  Name: Jeffrey Trujillo MRN: [id number] Date of Birth: 11/23/1950  Expected Discharge Date:      11/18/2015            Expected Discharge Plan:  [place]  In-House Referral:     Discharge planning Services  CM Consult  Post Acute Care Choice:    Choice offered to:     DME Arranged:    DME Agency:     HH Arranged:  RN, PT [place] Agency:  [facility]  Status of Service:  Completed, signed off  Medicare Important Message Given:    Date Medicare IM Given:    Medicare IM give by:    Date Additional Medicare IM Given:    Additional Medicare Important Message give by:     If discussed at [place] of Stay Meetings, dates discussed:    Additional Comments: Pt discharged home today with [facility] nursing and PT through [facility] (per pt's choice). Myriam Jacobson, of [facility] notified of referral and faxed pt info. Pt also ordered NT but pt refused. Pt's wife at bedside. Pt aware HH has 48 hours to make their first visit. RW recommended by PT. Pt has a RW to use at home.   Sherald Barge, RN 11/18/2015, 1:44 PM

## 2015-11-18 NOTE — Care Management Note (Signed)
Case Management Note  Patient Details  Name: NED FIORETTI MRN: [id number] Date of Birth: 1951/05/28  Expected Discharge Date:    11/18/2015              Expected Discharge Plan:  [place]  In-House Referral:     Discharge planning Services  CM Consult  Post Acute Care Choice:    Choice offered to:     DME Arranged:    DME Agency:     HH Arranged:  RN, PT [place] Agency:  [facility]  Status of Service:  Completed, signed off  Medicare Important Message Given:     Date Medicare IM Given:    Medicare IM give by:    Date Additional Medicare IM Given:    Additional Medicare Important Message give by:     If discussed at [place] of Stay Meetings, dates discussed:    Additional Comments:  Sherald Barge, RN 11/18/2015, 1:42 PM

## 2015-11-18 NOTE — Progress Notes (Signed)
Patient discharged home with family Jeffrey Trujillo to follow up with patient. Wife instructed on wound care verbalized underdstanding. Discharge instructions and RX's explained/ provided to patient and wife verbalized understanding. Patient  Left floor via wheelchair accompanied by staff no c/o pain or shortness of breath at d/c.  Ashlynd Michna [employer]

## 2015-11-18 NOTE — Discharge Summary (Signed)
. Physician Discharge Summary  Jeffrey Trujillo [id number] DOB: [birth year] DOA: 11/12/2015  PCP: [name], MD  Admit date: 11/12/2015 Discharge date: 11/18/2015  Time spent: 45 minutes  Recommendations for Outpatient Follow-up:  1. Patient needs to complete course of Flagyl on 12/01/15 2. Will need close outpatient follow-up with surgeon [name] apparently scheduled already for 11/24/15 3. Consider follow-up with [name] as an outpatient 4. Hard prescription for Percocet given on discharge 5. Home health RN will be ordered to assist with dressing changes  Discharge Diagnoses:  Principal Problem:   Perirectal abscess Active Problems:   Renal cell cancer ([place])   Hypokalemia   Anemia of chronic disease   Acute gout   Discharge Condition: Fair  Diet recommendation: Heart healthy low-salt  Filed Weights   11/12/15 0913 11/12/15 1621  Weight: 74.39 kg (164 lb) 77.8 kg (171 lb 8.3 oz)    History of present illness:  65 year old male with history renal cell CA gout admitted with low back pain and recently sent home from the ED with muscle skeletal back pain represented on 11/12/15 with not in his lower back  Found to have ischial rectal abscess and underwent incision and drainage Complicated by C. difficile see below  Hospital Course:  ?CDIFF Cdiff AG + but toxin neg However, as is symptomatic, d/c Cipro Continue flagyl monotherapy to complete on 3/16/1 Discontinue PO steroids on 3.2 that was started for gout'  Perirectal abscess -S/p OR 2/26. -Op cx with multiple organisms, none predominant- Mainly Bacteroides -Zosyn--> transitioned to PO Cipro/Flagyl 11/16/15-stop date 3.7.17 [see above discussion] -Pain control as needed.  Acute Gout Flare -Will start steroid taper-see above -Joints affected: right 1st MTP joint, left 1st PIP joint.  Renal cell Ca -F/u with oncology as an OP.  Hypokalemia -Replaced  Hypomagnesemia -replaced  Anemia of Chronic  Disease -Due to RCC. -Hb with appropriate response to transfusion of 1 unit of PrBCs on 2/25. -rpt CBC am  Procedures:    Consultations:  General surgery  Discharge Exam: Filed Vitals:   11/17/15 2203 11/18/15 0524  BP: 118/64 137/61  Pulse: 70   Temp: 98.2 F (36.8 C) 97.7 F (36.5 C)  Resp: 18     Fair today, more solid stool, no other issues, pain moderately controlled  General: EOMI NCAT Cardiovascular: S1-S2 no murmur rub or gallop Respiratory: Clinically clear no added sound no rales no rhonchi Bottom not examined today but yesterday. Around the wound looked little red   Discharge Instructions   Discharge Instructions    Diet - low sodium heart healthy    Complete by:  As directed      Discharge instructions    Complete by:  As directed   Completed the course of Flagyl and take all of the tablets 12/01/15 You have a type of infectious diarrhea call C. difficile which is curable but will need attention and I would recommend you eat foods that are fermented such as yogurt and other yeasty foods which will help to restore the good bacteria pneumonia. We will prescribe a low-dose of Percocet for pain from the surgery  Please follow-up with [name] as recommended-recommend doing sitz baths with Epsom salts in a pool of water and the nurse will explain to how to perform this Get some lab work done at her primary physician's office or [name] office on follow-up with him     Increase activity slowly    Complete by:  As directed  Current Discharge Medication List    START taking these medications   Details  metroNIDAZOLE (FLAGYL) 500 MG tablet Take 1 tablet (500 mg total) by mouth every 8 (eight) hours. Qty: 65 tablet, Refills: 0    zolpidem (AMBIEN) 5 MG tablet Take 1 tablet (5 mg total) by mouth at bedtime as needed for sleep. Qty: 30 tablet, Refills: 0      CONTINUE these medications which have CHANGED   Details  oxyCODONE-acetaminophen  (PERCOCET/ROXICET) 5-325 MG tablet Take 1-2 tablets by mouth every 4 (four) hours as needed for moderate pain. Qty: 30 tablet, Refills: 0      CONTINUE these medications which have NOT CHANGED   Details  diphenhydrAMINE (BENADRYL) 25 MG tablet Take 25 mg by mouth daily as needed for itching.    indomethacin (INDOCIN) 50 MG capsule Take 50 mg by mouth 3 (three) times daily.    Associated Diagnoses: Renal cell cancer, unspecified laterality (HCC)    pantoprazole (PROTONIX) 40 MG tablet TAKE (1) TABLET BY MOUTH TWICE A DAY BEFORE MEALS. (BREAKFAST AND SUPPER) Qty: 60 tablet, Refills: 5    pazopanib (VOTRIENT) 200 MG tablet Take 2 tablets by mouth, daily on an empty stomach Qty: 60 tablet, Refills: 0   Associated Diagnoses: Renal cell cancer, unspecified laterality (HCC)    topiramate (TOPAMAX) 25 MG tablet Take 25 mg by mouth 2 (two) times daily.   Associated Diagnoses: Renal cell cancer, unspecified laterality ([place])      STOP taking these medications     predniSONE (DELTASONE) 50 MG tablet        Allergies  Allergen Reactions  . Amoxicillin Nausea And Vomiting  . Nsaids Other (See Comments)    Patient only has one kidney.   . Demerol [Meperidine] Rash  . Meperidine Hcl Hives and Rash   Follow-up Information    Follow up with Jamesetta So, MD. Schedule an appointment as soon as possible for a visit on 11/24/2015.   Specialty:  General Surgery   Contact information:   [address] [place] [id number] [phone]        The results of significant diagnostics from this hospitalization (including imaging, microbiology, ancillary and laboratory) are listed below for reference.    Significant Diagnostic Studies: Ct Abdomen Pelvis Wo Contrast  11/12/2015  CLINICAL DATA:  65 year old male with abdominal and back pain and rectal pain and fullness. EXAM: CT ABDOMEN AND PELVIS WITHOUT CONTRAST TECHNIQUE: Multidetector CT imaging of the abdomen and pelvis was  performed following the standard protocol without IV contrast. COMPARISON:  09/13/2015 and prior CTs FINDINGS: Please note that parenchymal abnormalities may be missed without intravenous contrast. Lower chest:  A trace left pleural effusion is noted. Hepatobiliary: The liver and gallbladder are unremarkable. There is no evidence of biliary dilatation. Pancreas: Unremarkable Spleen: Upper limits of normal sized Adrenals/Urinary Tract: A mass within the upper left kidney is grossly unchanged. An upper pole cyst is again noted. Patient is status post right nephrectomy. The adrenal glands and bladder are unchanged. Stomach/Bowel: A 4 x 2.3 x 10.5 cm (AP x transverse x CC) right perirectal abscess is identified and extends from the of the mid-distal rectum to the inner right gluteal region. Focal right rectal wall thickening is identified (image 85). There is no evidence of bowel obstruction, pneumoperitoneum or other definite bowel wall thickening. Vascular/Lymphatic: No evidence of abdominal aortic aneurysm or enlarged lymph nodes. Reproductive: Mild prostate enlargement noted. Other: No free fluid. Musculoskeletal: No acute abnormalities identified. IMPRESSION: 4  x 2.8 x 10.5 cm right perirectal abscess with focal right rectal wall thickening. Grossly unchanged left upper renal mass. Trace left pleural effusion. Electronically Signed   By: Margarette Canada M.D.   On: 11/12/2015 10:52   Dg Chest 2 View  11/12/2015  CLINICAL DATA:  Rectal cancer, unspecified laterality.  Back pain. EXAM: CHEST  2 VIEW COMPARISON:  01/03/2015 FINDINGS: Chronic blunting at the left costophrenic angle. Old deformity of the left sixth rib. Lungs are clear without airspace disease or pulmonary edema. Heart and mediastinum are stable and within normal limits. No pleural effusions. IMPRESSION: No active cardiopulmonary disease.- Electronically Signed   By: Markus Daft M.D.   On: 11/12/2015 14:20   Dg Lumbar Spine Complete  11/09/2015   CLINICAL DATA:  65 year old male with acute lumbar pain for 3 days with no known injury. Initial encounter. EXAM: LUMBAR SPINE - COMPLETE 4+ VIEW COMPARISON:  CT chest, Abdomen and Pelvis 09/13/2015 FINDINGS: Normal lumbar segmentation. Stable vertebral height and alignment, trace anterolisthesis at L4-L5. Preserved disc spaces except at L5-S1 were there is chronic disc space loss and vacuum disc. Moderate facet hypertrophy from the L4 level to the sacrum re- demonstrated, L4-L5 vacuum facet phenomena previously by CT. No pars fracture. Visible lower thoracic levels appear intact. Sacral ala and SI joints within normal limits. Stable pelvic phleboliths. Stable dystrophic calcifications about the right adrenal gland, and the right kidney is seen to be surgically or congenitally absent on the comparison. IMPRESSION: 1.  No acute osseous abnormality identified in lumbar spine. 2. Chronically advanced L5-S1 disc degeneration, and L4-L5 facet degeneration. Electronically Signed   By: Genevie Ann M.D.   On: 11/09/2015 08:27   Dg Chest Port 1 View  11/13/2015  CLINICAL DATA:  PICC placement. EXAM: PORTABLE CHEST 1 VIEW COMPARISON:  Earlier this day at 1402 hour FINDINGS: Right-sided central line, likely from an internal jugular approach, tip in the mid SVC. No pneumothorax. The cardiomediastinal contours are normal. Chronic blunting of left costophrenic angle. Minimal right basilar atelectasis. Pulmonary vasculature is normal. No consolidation, pleural effusion, or pneumothorax. No acute osseous abnormalities are seen. Left posterior rib deformity again seen. IMPRESSION: Tip of the right central line in the mid SVC.  No pneumothorax. Electronically Signed   By: Jeb Levering M.D.   On: 11/13/2015 01:05    Microbiology: Recent Results (from the past 240 hour(s))  Blood culture (routine x 2)     Status: None   Collection Time: 11/12/15 11:50 AM  Result Value Ref Range Status   Specimen Description BLOOD RIGHT  ANTECUBITAL  Final   Special Requests BOTTLES DRAWN AEROBIC AND ANAEROBIC [place]  Final   Culture NO GROWTH 5 DAYS  Final   Report Status 11/17/2015 FINAL  Final  Blood culture (routine x 2)     Status: None   Collection Time: 11/12/15 12:00 PM  Result Value Ref Range Status   Specimen Description BLOOD LEFT HAND  Final   Special Requests BOTTLES DRAWN AEROBIC AND ANAEROBIC [place]  Final   Culture NO GROWTH 5 DAYS  Final   Report Status 11/17/2015 FINAL  Final  Surgical pcr screen     Status: None   Collection Time: 11/12/15  8:54 PM  Result Value Ref Range Status   MRSA, PCR NEGATIVE NEGATIVE Final   Staphylococcus aureus NEGATIVE NEGATIVE Final    Comment:        The Xpert SA Assay (FDA approved for NASAL specimens in patients over  39 years of age), is one component of a comprehensive surveillance program.  Test performance has been validated by [facility] for patients greater than or equal to 82 year old. It is not intended to diagnose infection nor to guide or monitor treatment.   Anaerobic culture     Status: None   Collection Time: 11/13/15  9:00 AM  Result Value Ref Range Status   Specimen Description ABSCESS PERIRECTAL  Final   Special Requests ZOSYN  IMMUNE:COMPROMISED  Final   Gram Stain   Final    NO WBC SEEN NO SQUAMOUS EPITHELIAL CELLS SEEN NO ORGANISMS SEEN Performed at [facility]    Culture   Final    BACTEROIDES FRAGILIS Note: BETA LACTAMASE POSITIVE Performed at [facility]    Report Status 11/17/2015 FINAL  Final  Culture, routine-abscess     Status: None   Collection Time: 11/13/15  9:00 AM  Result Value Ref Range Status   Specimen Description ABSCESS PERIRECTAL  Final   Special Requests ZOSYN  IMMUNE:COMPROMISED  Final   Gram Stain   Final    MODERATE WBC PRESENT,BOTH PMN AND MONONUCLEAR NO SQUAMOUS EPITHELIAL CELLS SEEN FEW GRAM POSITIVE COCCI IN CLUSTERS FEW GRAM NEGATIVE RODS FEW GRAM POSITIVE RODS Performed at  [facility]    Culture   Final    MULTIPLE ORGANISMS PRESENT, NONE PREDOMINANT Note: NO STAPHYLOCOCCUS AUREUS ISOLATED NO GROUP A STREP (S.PYOGENES) ISOLATED Performed at [facility]    Report Status 11/16/2015 FINAL  Final  C difficile quick scan w PCR reflex     Status: Abnormal   Collection Time: 11/17/15 10:15 AM  Result Value Ref Range Status   C Diff antigen POSITIVE (A) NEGATIVE Final   C Diff toxin NEGATIVE NEGATIVE Final   C Diff interpretation   Final    C. difficile present, but toxin not detected. This indicates colonization. In most cases, this does not require treatment. If patient has signs and symptoms consistent with colitis, consider treatment. Requires ENTERIC precautions.     Labs: Basic Metabolic Panel:  Recent Labs Lab 11/12/15 1037  11/13/15 [id number] 11/14/15 0546 11/15/15 0559 11/17/15 0545 11/18/15 0811  NA  --   < > 134* 134* 134* 135 136  K  --   < > 4.9 4.8 4.7 4.5 4.0  CL  --   < > 108 106 104 104 105  CO2  --   < > 19* 21* 24 23 25   GLUCOSE  --   < > 111* 104* 107* 127* 84  BUN  --   < > 28* 26* 27* 33* 37*  CREATININE  --   < > 1.53* 1.63* 1.75* 1.56* 1.57*  CALCIUM  --   < > 8.2* 8.2* 8.4* 9.0 9.1  MG 1.5*  --   --  1.9  --   --   --   < > = values in this interval not displayed. Liver Function Tests:  Recent Labs Lab 11/12/15 1039 11/17/15 0545  AST 40 14*  ALT 28 14*  ALKPHOS 100 77  BILITOT 0.7 0.6  PROT 6.5 6.3*  ALBUMIN 2.5* 2.2*   No results for input(s): LIPASE, AMYLASE in the last 168 hours. No results for input(s): AMMONIA in the last 168 hours. CBC:  Recent Labs Lab 11/12/15 1039 11/13/15 0647 11/14/15 0546 11/15/15 0559 11/17/15 0545  WBC 7.4 6.5 7.6 5.3 4.1  NEUTROABS 6.8  --   --   --  3.7  HGB 8.8* 9.2* 9.0* 7.9* 7.8*  HCT 26.6* 28.1* 27.6* 24.0* 23.7*  MCV 108.1* 105.2* 106.2* 106.7* 106.8*  PLT 205 198 253 281 269   Cardiac Enzymes: No results for input(s): CKTOTAL, CKMB, CKMBINDEX,  TROPONINI in the last 168 hours. BNP: BNP (last 3 results)  Recent Labs  01/03/15 1043  BNP 57.0    ProBNP (last 3 results) No results for input(s): PROBNP in the last 8760 hours.  CBG: No results for input(s): GLUCAP in the last 168 hours.     SignedNita Sells MD   [facility] 11/18/2015, 11:34 AM

## 2015-12-07 ENCOUNTER — Emergency Department (HOSPITAL_COMMUNITY): Payer: BLUE CROSS/BLUE SHIELD

## 2015-12-07 ENCOUNTER — Observation Stay (HOSPITAL_COMMUNITY)
Admission: EM | Admit: 2015-12-07 | Discharge: 2015-12-09 | Disposition: A | Discharge status: Home / Self Care | Payer: BLUE CROSS/BLUE SHIELD | Attending: Internal Medicine | Admitting: Internal Medicine

## 2015-12-07 ENCOUNTER — Encounter (HOSPITAL_COMMUNITY): Payer: Self-pay

## 2015-12-07 DIAGNOSIS — R51 Headache: Secondary | ICD-10-CM | POA: Insufficient documentation

## 2015-12-07 DIAGNOSIS — K8689 Other specified diseases of pancreas: Secondary | ICD-10-CM | POA: Diagnosis present

## 2015-12-07 DIAGNOSIS — M19031 Primary osteoarthritis, right wrist: Secondary | ICD-10-CM | POA: Insufficient documentation

## 2015-12-07 DIAGNOSIS — S39012A Strain of muscle, fascia and tendon of lower back, initial encounter: Secondary | ICD-10-CM | POA: Diagnosis present

## 2015-12-07 DIAGNOSIS — R0789 Other chest pain: Secondary | ICD-10-CM

## 2015-12-07 DIAGNOSIS — R0602 Shortness of breath: Secondary | ICD-10-CM | POA: Diagnosis not present

## 2015-12-07 DIAGNOSIS — Y999 Unspecified external cause status: Secondary | ICD-10-CM | POA: Diagnosis not present

## 2015-12-07 DIAGNOSIS — S24109A Unspecified injury at unspecified level of thoracic spinal cord, initial encounter: Secondary | ICD-10-CM | POA: Diagnosis not present

## 2015-12-07 DIAGNOSIS — M19032 Primary osteoarthritis, left wrist: Secondary | ICD-10-CM | POA: Insufficient documentation

## 2015-12-07 DIAGNOSIS — Z791 Long term (current) use of non-steroidal anti-inflammatories (NSAID): Secondary | ICD-10-CM | POA: Diagnosis not present

## 2015-12-07 DIAGNOSIS — M109 Gout, unspecified: Secondary | ICD-10-CM | POA: Diagnosis present

## 2015-12-07 DIAGNOSIS — Y929 Unspecified place or not applicable: Secondary | ICD-10-CM | POA: Insufficient documentation

## 2015-12-07 DIAGNOSIS — W19XXXA Unspecified fall, initial encounter: Secondary | ICD-10-CM | POA: Diagnosis not present

## 2015-12-07 DIAGNOSIS — F1010 Alcohol abuse, uncomplicated: Secondary | ICD-10-CM | POA: Diagnosis present

## 2015-12-07 DIAGNOSIS — Z87891 Personal history of nicotine dependence: Secondary | ICD-10-CM | POA: Insufficient documentation

## 2015-12-07 DIAGNOSIS — Y939 Activity, unspecified: Secondary | ICD-10-CM | POA: Insufficient documentation

## 2015-12-07 DIAGNOSIS — R509 Fever, unspecified: Secondary | ICD-10-CM | POA: Diagnosis not present

## 2015-12-07 DIAGNOSIS — M19039 Primary osteoarthritis, unspecified wrist: Secondary | ICD-10-CM

## 2015-12-07 DIAGNOSIS — M1990 Unspecified osteoarthritis, unspecified site: Secondary | ICD-10-CM | POA: Diagnosis present

## 2015-12-07 DIAGNOSIS — C649 Malignant neoplasm of unspecified kidney, except renal pelvis: Secondary | ICD-10-CM | POA: Diagnosis present

## 2015-12-07 DIAGNOSIS — Z79899 Other long term (current) drug therapy: Secondary | ICD-10-CM | POA: Diagnosis not present

## 2015-12-07 DIAGNOSIS — R197 Diarrhea, unspecified: Secondary | ICD-10-CM | POA: Insufficient documentation

## 2015-12-07 DIAGNOSIS — D638 Anemia in other chronic diseases classified elsewhere: Secondary | ICD-10-CM | POA: Diagnosis present

## 2015-12-07 LAB — CBC WITH DIFFERENTIAL/PLATELET
Basophils Absolute: 0 10*3/uL (ref 0.0–0.1)
Basophils Relative: 0 %
Eosinophils Absolute: 0 10*3/uL (ref 0.0–0.7)
Eosinophils Relative: 0 %
HEMATOCRIT: 27.5 % — AB (ref 39.0–52.0)
HEMOGLOBIN: 9.3 g/dL — AB (ref 13.0–17.0)
LYMPHS ABS: 0.6 10*3/uL — AB (ref 0.7–4.0)
LYMPHS PCT: 13 %
MCH: 33.7 pg (ref 26.0–34.0)
MCHC: 33.8 g/dL (ref 30.0–36.0)
MCV: 99.6 fL (ref 78.0–100.0)
MONOS PCT: 12 %
Monocytes Absolute: 0.6 10*3/uL (ref 0.1–1.0)
NEUTROS PCT: 75 %
Neutro Abs: 3.6 10*3/uL (ref 1.7–7.7)
Platelets: 167 10*3/uL (ref 150–400)
RBC: 2.76 MIL/uL — AB (ref 4.22–5.81)
RDW: 17.1 % — ABNORMAL HIGH (ref 11.5–15.5)
WBC: 4.8 10*3/uL (ref 4.0–10.5)

## 2015-12-07 LAB — COMPREHENSIVE METABOLIC PANEL
ALT: 15 U/L — AB (ref 17–63)
ANION GAP: 9 (ref 5–15)
AST: 21 U/L (ref 15–41)
Albumin: 3.3 g/dL — ABNORMAL LOW (ref 3.5–5.0)
Alkaline Phosphatase: 69 U/L (ref 38–126)
BUN: 14 mg/dL (ref 6–20)
CHLORIDE: 102 mmol/L (ref 101–111)
CO2: 23 mmol/L (ref 22–32)
CREATININE: 1.39 mg/dL — AB (ref 0.61–1.24)
Calcium: 8.9 mg/dL (ref 8.9–10.3)
GFR calc non Af Amer: 52 mL/min — ABNORMAL LOW (ref >60)
Glucose, Bld: 127 mg/dL — ABNORMAL HIGH (ref 65–99)
POTASSIUM: 4.2 mmol/L (ref 3.5–5.1)
SODIUM: 134 mmol/L — AB (ref 135–145)
Total Bilirubin: 1.2 mg/dL (ref 0.3–1.2)
Total Protein: 7.3 g/dL (ref 6.5–8.1)

## 2015-12-07 LAB — I-STAT TROPONIN, ED: Troponin i, poc: 0 ng/mL (ref 0.00–0.08)

## 2015-12-07 LAB — URINALYSIS, ROUTINE W REFLEX MICROSCOPIC
BILIRUBIN URINE: NEGATIVE
Glucose, UA: NEGATIVE mg/dL
HGB URINE DIPSTICK: NEGATIVE
Ketones, ur: NEGATIVE mg/dL
Leukocytes, UA: NEGATIVE
Nitrite: NEGATIVE
PH: 7.5 (ref 5.0–8.0)
Protein, ur: NEGATIVE mg/dL
SPECIFIC GRAVITY, URINE: 1.01 (ref 1.005–1.030)

## 2015-12-07 LAB — LIPASE, BLOOD: Lipase: 26 U/L (ref 11–51)

## 2015-12-07 MED ORDER — OXYCODONE-ACETAMINOPHEN 5-325 MG PO TABS
2.0000 | ORAL_TABLET | ORAL | Status: DC | PRN
  Administered 2015-12-07 – 2015-12-09 (×9): 2 via ORAL
  Filled 2015-12-07 (×9): qty 2

## 2015-12-07 MED ORDER — MORPHINE SULFATE (PF) 2 MG/ML IV SOLN
2.0000 mg | Freq: Once | INTRAVENOUS | Status: AC
  Administered 2015-12-07: 2 mg via INTRAVENOUS
  Filled 2015-12-07: qty 1

## 2015-12-07 MED ORDER — MORPHINE SULFATE (PF) 2 MG/ML IV SOLN
2.0000 mg | Freq: Once | INTRAVENOUS | Status: AC
Start: 2015-12-07 — End: 2015-12-07
  Administered 2015-12-07: 2 mg via INTRAVENOUS
  Filled 2015-12-07: qty 1

## 2015-12-07 MED ORDER — SODIUM CHLORIDE 0.9% FLUSH: 3.0000 mL | INTRAVENOUS | Status: DC | PRN

## 2015-12-07 MED ORDER — SODIUM CHLORIDE 0.9 % IV BOLUS (SEPSIS)
250.0000 mL | Freq: Once | INTRAVENOUS | Status: AC
  Administered 2015-12-07: 250 mL via INTRAVENOUS

## 2015-12-07 MED ORDER — SODIUM CHLORIDE 0.9% FLUSH
3.0000 mL | Freq: Two times a day (BID) | INTRAVENOUS | Status: DC
  Administered 2015-12-07: 3 mL via INTRAVENOUS

## 2015-12-07 MED ORDER — IOHEXOL 300 MG/ML  SOLN
100.0000 mL | Freq: Once | INTRAMUSCULAR | Status: AC | PRN
  Administered 2015-12-07: 100 mL via INTRAVENOUS

## 2015-12-07 MED ORDER — ONDANSETRON HCL 4 MG/2ML IJ SOLN
4.0000 mg | Freq: Once | INTRAMUSCULAR | Status: AC
  Administered 2015-12-07: 4 mg via INTRAVENOUS
  Filled 2015-12-07: qty 2

## 2015-12-07 MED ORDER — PIPERACILLIN-TAZOBACTAM 3.375 G IVPB 30 MIN
3.3750 g | Freq: Once | INTRAVENOUS | Status: AC
  Administered 2015-12-07: 3.375 g via INTRAVENOUS
  Filled 2015-12-07: qty 50

## 2015-12-07 MED ORDER — INDOMETHACIN 25 MG PO CAPS
50.0000 mg | ORAL_CAPSULE | Freq: Three times a day (TID) | ORAL | Status: DC | PRN
  Administered 2015-12-07: 50 mg via ORAL
  Filled 2015-12-07: qty 2

## 2015-12-07 MED ORDER — ACETAMINOPHEN 500 MG PO TABS
1000.0000 mg | ORAL_TABLET | Freq: Once | ORAL | Status: AC
  Administered 2015-12-07: 1000 mg via ORAL
  Filled 2015-12-07: qty 2

## 2015-12-07 MED ORDER — SODIUM CHLORIDE 0.9 % IV SOLN
250.0000 mL | INTRAVENOUS | Status: DC | PRN
Start: 2015-12-07 — End: 2015-12-09

## 2015-12-07 MED ORDER — INDOMETHACIN 25 MG PO CAPS: 50.0000 mg | ORAL_CAPSULE | Freq: Three times a day (TID) | ORAL | Status: DC | PRN

## 2015-12-07 MED ORDER — SODIUM CHLORIDE 0.9 % IV SOLN
Freq: Once | INTRAVENOUS | Status: AC
  Administered 2015-12-07: 17:00:00 via INTRAVENOUS

## 2015-12-07 MED ORDER — OXYCODONE-ACETAMINOPHEN 5-325 MG PO TABS: 1.0000 | ORAL_TABLET | Freq: Four times a day (QID) | ORAL | Status: DC | PRN

## 2015-12-07 MED ORDER — SODIUM CHLORIDE 0.9 % IV SOLN
INTRAVENOUS | Status: AC
  Administered 2015-12-07: 23:00:00 via INTRAVENOUS

## 2015-12-07 MED ORDER — PIPERACILLIN-TAZOBACTAM 3.375 G IVPB
3.3750 g | Freq: Three times a day (TID) | INTRAVENOUS | Status: DC
  Administered 2015-12-08 (×2): 3.375 g via INTRAVENOUS
  Filled 2015-12-07 (×2): qty 50

## 2015-12-07 MED ORDER — SODIUM CHLORIDE 0.9 % IV SOLN
INTRAVENOUS | Status: DC
  Administered 2015-12-07: 21:00:00 via INTRAVENOUS

## 2015-12-07 MED ORDER — VANCOMYCIN HCL IN DEXTROSE 1-5 GM/200ML-% IV SOLN
1000.0000 mg | Freq: Once | INTRAVENOUS | Status: DC
  Administered 2015-12-07: 1000 mg via INTRAVENOUS
  Filled 2015-12-07: qty 200

## 2015-12-07 NOTE — ED Notes (Signed)
Pt's wife says pt is "passing a little blood in his stool too."

## 2015-12-07 NOTE — ED Provider Notes (Addendum)
CSN: [id number]     Arrival date & time 12/07/15  [id number] History  By signing my name below, I, Dora Sims, attest that this documentation has been prepared under the direction and in the presence of physician practitioner, Fredia Sorrow, MD,. Electronically Signed: Dora Sims, Scribe. 12/07/2015. 8:58 AM.    Chief Complaint  Patient presents with  . Chest Pain    Patient is a 65 y.o. male presenting with chest pain. The history is provided by the patient and the spouse. No language interpreter was used.  Chest Pain Pain location:  L chest Pain radiates to:  Does not radiate Pain radiates to the back: no   Pain severity:  Severe Onset quality:  Sudden Duration:  1 day Timing:  Constant Progression:  Unable to specify Chronicity:  New Context: breathing and raising an arm   Relieved by:  None tried Worsened by:  Deep breathing Ineffective treatments:  None tried Associated symptoms: back pain, fever, headache and shortness of breath   Associated symptoms: no abdominal pain, no cough, no fatigue, no nausea and not vomiting   Fever:    Timing:  Unable to specify   Temp source:  Subjective   Progression:  Unable to specify Headaches:    Severity:  Unable to specify   Onset quality:  Sudden   Duration:  4 days   Timing:  Constant   Progression:  Unable to specify   Chronicity:  New Shortness of breath:    Severity:  Severe   Onset quality:  Sudden   Duration:  1 day   Timing:  Unable to specify   Progression:  Unable to specify    HPI Comments: Jeffrey Trujillo is a 65 y.o. male brought in by his wife with h/o GERD, arthritis, and gout who presents to the Emergency Department complaining of sudden onset, constant, severe, left chest pain since approximately 10AM yesterday morning. He states that his left chest pain prevented him from sleeping last night. He had his lower left lung removed around 45 years ago. He notes chest pain exacerbation with breathing and left arm  movement. He endorses significant chest pain exacerbation with palpation to his left chest. Pt also states that he fell on concrete four days ago and has experienced upper thoracic and lumbar back pain since the fall. Pt notes bruises to his upper thoracic and lumbar spines s/p fall. Pt hit his head during the fall; he endorses associated headache but denies LOC. He also notes bilateral wrist swelling. He states that he had a right nephrectomy due to cancer. Pt has a tremor at baseline. He notes experiencing diarrhea recently. Pt is allergic to Demerol. He was administered morphine s/p a recent rectal surgery performed by [name] with no complications. He denies cough, dysuria, nausea, vomiting, or any other associated symptoms.  Past Medical History  Diagnosis Date  . Gout   . DJD (degenerative joint disease) of cervical spine   . Diverticulosis of colon     by colonoscopy 11/02/2009  . Barrett's esophagus     by EGD 11/02/2009  . Anemia   . Cancer [facility])     "was told has cancer on other kidney and spot on pancreas"  . 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 GERD (gastroesophageal reflux disease)    Past Surgical History  Procedure Laterality Date  . Nephrectomy      right due to cancer  . Lung removal, partial  left lower lobe, benign  . Eus  08/07/2012    Procedure: UPPER ENDOSCOPIC ULTRASOUND (EUS) LINEAR;  Surgeon: [name], MD;  Location: [facility] ENDOSCOPY;  Service: Endoscopy;  Laterality: N/A;  . Inguinal hernia repair    . Hernia repair    . Inguinal hernia repair      Right  . Eus  08/21/2012    Procedure: UPPER ENDOSCOPIC ULTRASOUND (EUS) LINEAR;  Surgeon: [name], MD;  Location: [facility] ENDOSCOPY;  Service: Endoscopy;  Laterality: N/A;  . Colonoscopy  Feb 2011    [name]: single anal papilla, pan-colonic diverticula  . Esophagogastroduodenoscopy  Feb 2011    [name]: salmon-colored epithelium consistent with Barrett's, nodular antral erosions,  small hiatal hernia, negative H.pylori  . Colonoscopy  09/25/2012    RMR: Colonic diverticulosis. Suspect diverticular bleeding-hepatic flexure tic-sealed  . Esophagogastroduodenoscopy  09/25/2012    [id number] esophagus. Small hiatal hernia. Antral erosions of doubtful clinical significance, no biopsies  . Esophagogastroduodenoscopy N/A 03/11/2013    SLF: Barrett's esophagus/Single ulcer in the gastric antrum-PROBABLE SOURCE FOR BLACK STOOL/ Chronic gastritis in the gastric antrum  . Esophagogastroduodenoscopy N/A 10/11/2014    Dr. [name] barrett's s/p biopsy. Hiatal hernia. Biopsy with intestinal metaplasia, glandular atypia indefinite for low grade dysplasia noted as well  . Esophagogastroduodenoscopy N/A 01/19/2015    Procedure: ESOPHAGOGASTRODUODENOSCOPY (EGD);  Surgeon: [name], MD;  Location: [facility] ENDO SUITE;  Service: Endoscopy;  Laterality: N/A;  200  . Incision and drainage abscess N/A 11/13/2015    Procedure: INCISION AND DRAINAGE ABSCESS;  Surgeon: Aviva Signs, MD;  Location: [facility] ORS;  Service: General;  Laterality: N/A;   Family History  Problem Relation Age of Onset  . Lung cancer Brother   . Prostate cancer Brother   . Colon cancer Neg Hx   . Lumbar disc disease Brother   . Prostate cancer Brother    Social History  Substance Use Topics  . Smoking status: Former Smoker -- 0.25 packs/day for 4 years    Types: Cigars  . Smokeless tobacco: Never Used     Comment: occasional cigar  . Alcohol Use: 1.2 - 1.8 oz/week    2-3 Cans of beer per week     Comment: vodka and budweiser, sometimes daily, depends on notion. couple drinks. 2-3 beers on a weekend. pt says no etoh in 3 months    Review of Systems  Constitutional: Positive for fever and chills. Negative for fatigue.  HENT: Negative for rhinorrhea and sore throat.   Eyes: Negative for visual disturbance.  Respiratory: Positive for shortness of breath. Negative for cough.   Cardiovascular: Positive for chest  pain (left).  Gastrointestinal: Positive for diarrhea. Negative for nausea, vomiting and abdominal pain.  Genitourinary: Negative for dysuria.  Musculoskeletal: Positive for back pain and joint swelling (biilateral ankles).  Skin: Negative for rash.  Neurological: Positive for headaches. Negative for syncope.  Hematological: Does not bruise/bleed easily.  Psychiatric/Behavioral: Negative for confusion.      Allergies  Amoxicillin; Nsaids; Demerol; and Meperidine hcl  Home Medications   Prior to Admission medications   Medication Sig Start Date End Date Taking? Authorizing Provider  diphenhydrAMINE (BENADRYL) 25 MG tablet Take 25 mg by mouth daily as needed for itching.   Yes Historical Provider, MD  indomethacin (INDOCIN) 50 MG capsule Take 50 mg by mouth 3 (three) times daily.  07/05/15  Yes Historical Provider, MD  metroNIDAZOLE (FLAGYL) 500 MG tablet Take 1 tablet (500 mg total) by mouth every  8 (eight) hours. 11/18/15  Yes Nita Sells, MD  pantoprazole (PROTONIX) 40 MG tablet TAKE (1) TABLET BY MOUTH TWICE A DAY BEFORE MEALS. (BREAKFAST AND SUPPER) 04/26/15  Yes Carlis Stable, NP  pazopanib (VOTRIENT) 200 MG tablet Take 2 tablets by mouth, daily on an empty stomach 10/06/15  Yes [name], MD  topiramate (TOPAMAX) 25 MG tablet Take 25 mg by mouth 2 (two) times daily.   Yes Historical Provider, MD  oxyCODONE-acetaminophen (PERCOCET/ROXICET) 5-325 MG tablet Take 1-2 tablets by mouth every 4 (four) hours as needed for moderate pain. Patient not taking: Reported on 12/07/2015 11/18/15   Nita Sells, MD  oxyCODONE-acetaminophen (PERCOCET/ROXICET) 5-325 MG tablet Take 1-2 tablets by mouth every 6 (six) hours as needed for severe pain. 12/07/15   Fredia Sorrow, MD  zolpidem (AMBIEN) 5 MG tablet Take 1 tablet (5 mg total) by mouth at bedtime as needed for sleep. Patient not taking: Reported on 12/07/2015 11/18/15   Nita Sells, MD   BP 157/79 mmHg  Pulse 87  Temp(Src)  98.6 F (37 C) (Oral)  Resp 18  Ht 5\' 11"  (1.803 m)  Wt 77.111 kg  BMI 23.72 kg/m2  SpO2 96% Physical Exam  Constitutional: He is oriented to person, place, and time. He appears well-developed and well-nourished. No distress.  HENT:  Head: Normocephalic and atraumatic.  Mouth/Throat: Oropharynx is clear and moist and mucous membranes are normal.  Eyes: Conjunctivae and EOM are normal. Pupils are equal, round, and reactive to light. No scleral icterus.  Neck: Neck supple. No tracheal deviation present.  Cardiovascular: Normal rate and regular rhythm.   Pulmonary/Chest: Effort normal and breath sounds normal. No respiratory distress. He exhibits tenderness.  Abdominal: Bowel sounds are normal. There is no tenderness.  Musculoskeletal: Normal range of motion. He exhibits no edema.  Negative for bilateral ankle edema Both wrists are warm compared to feet Radial pulses 2+ bilaterally  Neurological: He is alert and oriented to person, place, and time.  Skin: Skin is warm and dry.  Psychiatric: He has a normal mood and affect. His behavior is normal.  Nursing note and vitals reviewed.   ED Course  Procedures (including critical care time)  DIAGNOSTIC STUDIES: Oxygen Saturation is 98% on RA, normal by my interpretation.    COORDINATION OF CARE: 8:58 AM Will administer fluids and morphine 2 mg injection. Will order DG Chest 2 View and DG Lumbar Spine Complete. Will order blood work. Will order ED EKG. Discussed treatment plan with pt at bedside and pt agreed to plan.  Medications  0.9 %  sodium chloride infusion ( Intravenous Rate/Dose Change 12/07/15 1205)  ondansetron (ZOFRAN) injection 4 mg (4 mg Intravenous Given 12/07/15 1105)  sodium chloride 0.9 % bolus 250 mL (0 mLs Intravenous Stopped 12/07/15 1205)  morphine 2 MG/ML injection 2 mg (2 mg Intravenous Given 12/07/15 1105)  morphine 2 MG/ML injection 2 mg (2 mg Intravenous Given 12/07/15 1324)    Results for orders placed or  performed during the hospital encounter of 12/07/15  Comprehensive metabolic panel  Result Value Ref Range   Sodium 134 (L) 135 - 145 mmol/L   Potassium 4.2 3.5 - 5.1 mmol/L   Chloride 102 101 - 111 mmol/L   CO2 23 22 - 32 mmol/L   Glucose, Bld 127 (H) 65 - 99 mg/dL   BUN 14 6 - 20 mg/dL   Creatinine, Ser 1.39 (H) 0.61 - 1.24 mg/dL   Calcium 8.9 8.9 - 10.3 mg/dL   Total  Protein 7.3 6.5 - 8.1 g/dL   Albumin 3.3 (L) 3.5 - 5.0 g/dL   AST 21 15 - 41 U/L   ALT 15 (L) 17 - 63 U/L   Alkaline Phosphatase 69 38 - 126 U/L   Total Bilirubin 1.2 0.3 - 1.2 mg/dL   GFR calc non Af Amer 52 (L) >60 mL/min   GFR calc Af Amer >60 >60 mL/min   Anion gap 9 5 - 15  CBC with Differential/Platelet  Result Value Ref Range   WBC 4.8 4.0 - 10.5 K/uL   RBC 2.76 (L) 4.22 - 5.81 MIL/uL   Hemoglobin 9.3 (L) 13.0 - 17.0 g/dL   HCT 27.5 (L) 39.0 - 52.0 %   MCV 99.6 78.0 - 100.0 fL   MCH 33.7 26.0 - 34.0 pg   MCHC 33.8 30.0 - 36.0 g/dL   RDW 17.1 (H) 11.5 - 15.5 %   Platelets 167 150 - 400 K/uL   Neutrophils Relative % 75 %   Neutro Abs 3.6 1.7 - 7.7 K/uL   Lymphocytes Relative 13 %   Lymphs Abs 0.6 (L) 0.7 - 4.0 K/uL   Monocytes Relative 12 %   Monocytes Absolute 0.6 0.1 - 1.0 K/uL   Eosinophils Relative 0 %   Eosinophils Absolute 0.0 0.0 - 0.7 K/uL   Basophils Relative 0 %   Basophils Absolute 0.0 0.0 - 0.1 K/uL  Urinalysis, Routine w reflex microscopic (not at [facility])  Result Value Ref Range   Color, Urine YELLOW YELLOW   APPearance CLEAR CLEAR   Specific Gravity, Urine 1.010 1.005 - 1.030   pH 7.5 5.0 - 8.0   Glucose, UA NEGATIVE NEGATIVE mg/dL   Hgb urine dipstick NEGATIVE NEGATIVE   Bilirubin Urine NEGATIVE NEGATIVE   Ketones, ur NEGATIVE NEGATIVE mg/dL   Protein, ur NEGATIVE NEGATIVE mg/dL   Nitrite NEGATIVE NEGATIVE   Leukocytes, UA NEGATIVE NEGATIVE  Lipase, blood  Result Value Ref Range   Lipase 26 11 - 51 U/L  I-Stat Troponin, ED (not at [facility])  Result Value Ref Range   Troponin i,  poc 0.00 0.00 - 0.08 ng/mL   Comment 3           Ct Abdomen Pelvis Wo Contrast  11/12/2015  CLINICAL DATA:  65 year old male with abdominal and back pain and rectal pain and fullness. EXAM: CT ABDOMEN AND PELVIS WITHOUT CONTRAST TECHNIQUE: Multidetector CT imaging of the abdomen and pelvis was performed following the standard protocol without IV contrast. COMPARISON:  09/13/2015 and prior CTs FINDINGS: Please note that parenchymal abnormalities may be missed without intravenous contrast. Lower chest:  A trace left pleural effusion is noted. Hepatobiliary: The liver and gallbladder are unremarkable. There is no evidence of biliary dilatation. Pancreas: Unremarkable Spleen: Upper limits of normal sized Adrenals/Urinary Tract: A mass within the upper left kidney is grossly unchanged. An upper pole cyst is again noted. Patient is status post right nephrectomy. The adrenal glands and bladder are unchanged. Stomach/Bowel: A 4 x 2.3 x 10.5 cm (AP x transverse x CC) right perirectal abscess is identified and extends from the of the mid-distal rectum to the inner right gluteal region. Focal right rectal wall thickening is identified (image 85). There is no evidence of bowel obstruction, pneumoperitoneum or other definite bowel wall thickening. Vascular/Lymphatic: No evidence of abdominal aortic aneurysm or enlarged lymph nodes. Reproductive: Mild prostate enlargement noted. Other: No free fluid. Musculoskeletal: No acute abnormalities identified. IMPRESSION: 4 x 2.8 x 10.5 cm right perirectal abscess with  focal right rectal wall thickening. Grossly unchanged left upper renal mass. Trace left pleural effusion. Electronically Signed   By: Margarette Canada M.D.   On: 11/12/2015 10:52   Dg Chest 2 View  12/07/2015  CLINICAL DATA:  65 year old male with a history of sudden onset severe left chest pain EXAM: CHEST - 2 VIEW COMPARISON:  11/12/2015, 09/13/2015 FINDINGS: Cardiomediastinal silhouette unchanged in size and contour.  Left-sided postoperative/post traumatic rib deformities are unchanged. Architectural distortion in the bilateral mid and lower lungs is unchanged. Blunting of the left costophrenic angle persists. No confluent airspace disease or pneumothorax.  No pleural effusion. Unremarkable appearance of the upper abdomen. IMPRESSION: Chronic lung changes without evidence of superimposed acute cardiopulmonary disease. Similar appearance of posttraumatic/ postsurgical deformity of the left chest wall. Signed, [name]. [name], DO [facility] Electronically Signed   By: [name] D.O.   On: 12/07/2015 11:53   Dg Chest 2 View  11/12/2015  CLINICAL DATA:  Rectal cancer, unspecified laterality.  Back pain. EXAM: CHEST  2 VIEW COMPARISON:  01/03/2015 FINDINGS: Chronic blunting at the left costophrenic angle. Old deformity of the left sixth rib. Lungs are clear without airspace disease or pulmonary edema. Heart and mediastinum are stable and within normal limits. No pleural effusions. IMPRESSION: No active cardiopulmonary disease.- Electronically Signed   By: Markus Daft M.D.   On: 11/12/2015 14:20   Dg Lumbar Spine Complete  12/07/2015  CLINICAL DATA:  Fall on concrete 4 days ago, upper thoracic and lumbar back pain, status post right nephrectomy EXAM: LUMBAR SPINE - COMPLETE 4+ VIEW COMPARISON:  11/12/2015 FINDINGS: Five views of lumbar spine submitted. No acute fracture or subluxation. Mild levoscoliosis. Mild disc space flattening at L4-L5 level. Mild anterior spurring upper endplate of L4 and L5 vertebral body. There is disc space flattening with endplate sclerotic changes mild anterior spurring at L5-S1 level. IMPRESSION: No acute fracture or subluxation. Mild levoscoliosis. Mild disc space flattening at L4-L5 level. Moderate disc space flattening with endplate sclerotic changes and mild anterior spurring at L5-S1 level. Electronically Signed   By: [name] M.D.    On: 12/07/2015 12:08   Dg Lumbar Spine Complete  11/09/2015  CLINICAL DATA:  65 year old male with acute lumbar pain for 3 days with no known injury. Initial encounter. EXAM: LUMBAR SPINE - COMPLETE 4+ VIEW COMPARISON:  CT chest, Abdomen and Pelvis 09/13/2015 FINDINGS: Normal lumbar segmentation. Stable vertebral height and alignment, trace anterolisthesis at L4-L5. Preserved disc spaces except at L5-S1 were there is chronic disc space loss and vacuum disc. Moderate facet hypertrophy from the L4 level to the sacrum re- demonstrated, L4-L5 vacuum facet phenomena previously by CT. No pars fracture. Visible lower thoracic levels appear intact. Sacral ala and SI joints within normal limits. Stable pelvic phleboliths. Stable dystrophic calcifications about the right adrenal gland, and the right kidney is seen to be surgically or congenitally absent on the comparison. IMPRESSION: 1.  No acute osseous abnormality identified in lumbar spine. 2. Chronically advanced L5-S1 disc degeneration, and L4-L5 facet degeneration. Electronically Signed   By: Genevie Ann M.D.   On: 11/09/2015 08:27   Dg Chest Port 1 View  11/13/2015  CLINICAL DATA:  PICC placement. EXAM: PORTABLE CHEST 1 VIEW COMPARISON:  Earlier this day at 1402 hour FINDINGS: Right-sided central line, likely from an internal jugular approach, tip in the mid SVC. No pneumothorax. The cardiomediastinal contours are normal. Chronic blunting of left costophrenic angle. Minimal right basilar atelectasis.  Pulmonary vasculature is normal. No consolidation, pleural effusion, or pneumothorax. No acute osseous abnormalities are seen. Left posterior rib deformity again seen. IMPRESSION: Tip of the right central line in the mid SVC.  No pneumothorax. Electronically Signed   By: Jeb Levering M.D.   On: 11/13/2015 01:05    MDM   Final diagnoses:  Chest wall pain  Wrist arthritis  Lumbar strain, initial encounter  Fall, initial encounter    Patient with several  complaints. One there was a fall several days ago resulting in some discomfort to his upper thoracic area and lumbar cervical spine area. Patient also with new complaint of left chest wall pain near the clavicle sternal junction. Worse with some arm movement worse with taking a deep breath. Tender to palpation in that area.  Patient also with bilateral wrist swelling may be consistent with his past history of gout or arthritis.  Patient's chest x-ray was negative. Lumbar spine was negative. Patient had a lumbar spine February 22 as well. Patient also CT of his abdomen on February 25 which showed a perirectal abscess which was taken care of by general surgery [name]. Today's workup without any new or acute findings. Patient's labs are improved from his baseline. Still has some anemia but does not require blood transfusion. No leukocytosis troponin was negative. Patient's had complained of the chest pain since 10:00 yesterday nonstop. This is not consistent with cardiac chest pain. Patient will be treated with pain medicine follow-up with his primary care doctor. Patient also has follow-up with [name] in the next few days.    I personally performed the services described in this documentation, which was scribed in my presence. The recorded information has been reviewed and is accurate.       Fredia Sorrow, MD 12/07/15 1505  At discharge patient was noted to have developed a fever to 101.4. Since he recently had the perirectal abscess drained. We'll go ahead and do CT abdomen and pelvis with contrast. And we'll also do CT chest the without contrast just to rule out the subtle or new pneumonia not showing up on chest x-ray. If both scans are negative then patient can be discharged home and treated symptomatically.  With the development of the fever is been no changes in his vital signs. No tachycardia no hypotension. Patient received Tylenol for the fever.    Fredia Sorrow,  MD 12/07/15 [id number]

## 2015-12-07 NOTE — ED Notes (Signed)
Pt finished oral contrast. Pt reports sudden onset of sob and "clamminess". EDP notified at bedside. Pt o2 saturation remained >99%. Pt alert and oriented. EDP reported to administer 500cc NS. NS started prior to pt being taken to CT. nad noted. Pt reports sob has subsided.

## 2015-12-07 NOTE — ED Notes (Signed)
Pt reporting increase in pain. EDP consulted. No new orders given at this time.

## 2015-12-07 NOTE — ED Notes (Signed)
Pt given contrast, pick up time is 1700.

## 2015-12-07 NOTE — ED Notes (Signed)
Patient ambulated to restroom. Patient needed minimal assistance. Patient did have an episode of diarrhea.

## 2015-12-07 NOTE — ED Provider Notes (Signed)
CT chest reveals an opacity in the posterior left lung base. He has been febrile. IV vancomycin, IV Zosyn. Influenza screen pending. Discussed with Dr. Shanon Brow.  Admit to observation  [name], MD 12/07/15 2106

## 2015-12-07 NOTE — ED Notes (Signed)
Pt reports woke up yesterday morning with pain in r side of back, pain and swelling, to both wrists, and chest pain.  Reports hurts to take a deep breath.  Pt also reports had a cyst drained on buttocks 5 weeks ago and since then has been laying around and has had diarrhea.  Pt says was given antibiotics but he thought they were causing his diarrhea so he stopped them.  Pt says still having diarrhea.

## 2015-12-07 NOTE — Discharge Instructions (Signed)
Take pain medicine as needed. Meghan pointed to follow-up with your regular doctor. Return for any new or worse symptoms. Today's workup without any significant acute findings.

## 2015-12-07 NOTE — ED Notes (Signed)
Pt assisted to bathroom by NT. Pt voided prior to being able to collect sample.

## 2015-12-07 NOTE — H&P (Signed)
PCP:   [facility], MD   Chief Complaint:  Pain all over  HPI: 65 yo male h/o gout, chronic back pain, RCC on oral chemo, recent hospitalization for perirectal absess and cdiff comes in with several days of "pain all over" but mostly when you touch him, and in his bilateral wrists.  Pt says this feels like his gout flares.  He denies any cough or uri symptoms.  He says his diarrhea is much improved from 3 weeks ago (completed course of flagyl for cdiff).  Denies any pleuritic chest pain.  His wrists are painful and swollen.  Did not know he was running fever but has temp in the ED of 101.  So sob, pt reports his rectal area is healing well after being I&d by dr Arnoldo Morale last month.  Pt had ct chest which shows possible pna.  Flu swab is pending.  Pt referred for admission for his fever.  Review of Systems:  Positive and negative as per HPI otherwise all other systems are negative  Past Medical History: Past Medical History  Diagnosis Date  . Gout   . DJD (degenerative joint disease) of cervical spine   . Diverticulosis of colon     by colonoscopy 11/02/2009  . Barrett's esophagus     by EGD 11/02/2009  . Anemia   . Cancer [facility]     "was told has cancer on other kidney and spot on pancreas"  . 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 GERD (gastroesophageal reflux disease)    Past Surgical History  Procedure Laterality Date  . Nephrectomy      right due to cancer  . Lung removal, partial      left lower lobe, benign  . Eus  08/07/2012    Procedure: UPPER ENDOSCOPIC ULTRASOUND (EUS) LINEAR;  Surgeon: [name], MD;  Location: [facility] ENDOSCOPY;  Service: Endoscopy;  Laterality: N/A;  . Inguinal hernia repair    . Hernia repair    . Inguinal hernia repair      Right  . Eus  08/21/2012    Procedure: UPPER ENDOSCOPIC ULTRASOUND (EUS) LINEAR;  Surgeon: [name], MD;  Location: [facility] ENDOSCOPY;  Service: Endoscopy;  Laterality: N/A;  . Colonoscopy  Feb 2011    Dr.  [name]: single anal papilla, pan-colonic diverticula  . Esophagogastroduodenoscopy  Feb 2011    [name]: salmon-colored epithelium consistent with Barrett's, nodular antral erosions, small hiatal hernia, negative H.pylori  . Colonoscopy  09/25/2012    RMR: Colonic diverticulosis. Suspect diverticular bleeding-hepatic flexure tic-sealed  . Esophagogastroduodenoscopy  09/25/2012    [id number] esophagus. Small hiatal hernia. Antral erosions of doubtful clinical significance, no biopsies  . Esophagogastroduodenoscopy N/A 03/11/2013    SLF: Barrett's esophagus/Single ulcer in the gastric antrum-PROBABLE SOURCE FOR BLACK STOOL/ Chronic gastritis in the gastric antrum  . Esophagogastroduodenoscopy N/A 10/11/2014    Dr. [name] barrett's s/p biopsy. Hiatal hernia. Biopsy with intestinal metaplasia, glandular atypia indefinite for low grade dysplasia noted as well  . Esophagogastroduodenoscopy N/A 01/19/2015    Procedure: ESOPHAGOGASTRODUODENOSCOPY (EGD);  Surgeon: [name], MD;  Location: [facility] ENDO SUITE;  Service: Endoscopy;  Laterality: N/A;  200  . Incision and drainage abscess N/A 11/13/2015    Procedure: INCISION AND DRAINAGE ABSCESS;  Surgeon: Aviva Signs, MD;  Location: [facility] ORS;  Service: General;  Laterality: N/A;    Medications: Prior to Admission medications   Medication Sig Start Date End Date Taking? Authorizing Provider  diphenhydrAMINE (BENADRYL) 25 MG tablet Take 25 mg by mouth daily as needed for itching.   Yes Historical Provider, MD  indomethacin (INDOCIN) 50 MG capsule Take 50 mg by mouth 3 (three) times daily.  07/05/15  Yes Historical Provider, MD  metroNIDAZOLE (FLAGYL) 500 MG tablet Take 1 tablet (500 mg total) by mouth every 8 (eight) hours. 11/18/15  Yes Nita Sells, MD  pantoprazole (PROTONIX) 40 MG tablet TAKE (1) TABLET BY MOUTH TWICE A DAY BEFORE MEALS. (BREAKFAST AND SUPPER) 04/26/15  Yes Carlis Stable, NP  pazopanib (VOTRIENT) 200 MG tablet Take 2 tablets by  mouth, daily on an empty stomach 10/06/15  Yes [name], MD  topiramate (TOPAMAX) 25 MG tablet Take 25 mg by mouth 2 (two) times daily.   Yes Historical Provider, MD  oxyCODONE-acetaminophen (PERCOCET/ROXICET) 5-325 MG tablet Take 1-2 tablets by mouth every 4 (four) hours as needed for moderate pain. Patient not taking: Reported on 12/07/2015 11/18/15   Nita Sells, MD  oxyCODONE-acetaminophen (PERCOCET/ROXICET) 5-325 MG tablet Take 1-2 tablets by mouth every 6 (six) hours as needed for severe pain. 12/07/15   Fredia Sorrow, MD  zolpidem (AMBIEN) 5 MG tablet Take 1 tablet (5 mg total) by mouth at bedtime as needed for sleep. Patient not taking: Reported on 12/07/2015 11/18/15   Nita Sells, MD    Allergies:   Allergies  Allergen Reactions  . Amoxicillin Nausea And Vomiting  . Nsaids Other (See Comments)    Patient only has one kidney.   . Demerol [Meperidine] Rash  . Meperidine Hcl Hives and Rash    Social History:  reports that he has quit smoking. His smoking use included Cigars. He has never used smokeless tobacco. He reports that he drinks about 1.2 - 1.8 oz of alcohol per week. He reports that he does not use illicit drugs.  Family History: Family History  Problem Relation Age of Onset  . Lung cancer Brother   . Prostate cancer Brother   . Colon cancer Neg Hx   . Lumbar disc disease Brother   . Prostate cancer Brother     Physical Exam: Filed Vitals:   12/07/15 2000 12/07/15 2059 12/07/15 2100 12/07/15 2202  BP: 127/68 154/79 166/78 152/87  Pulse: 61 72 66 99  Temp:   98.2 F (36.8 C) 98 F (36.7 C)  TempSrc:   Oral Oral  Resp: 17 21 14 16   Height:    5\' 11"  (1.803 m)  Weight:    73.3 kg (161 lb 9.6 oz)  SpO2: 96% 100% 100% 99%   General appearance: alert, cooperative and no distress Head: Normocephalic, without obvious abnormality, atraumatic Eyes: negative Nose: Nares normal. Septum midline. Mucosa normal. No drainage or sinus  tenderness. Neck: no JVD and supple, symmetrical, trachea midline Lungs: clear to auscultation bilaterally Heart: regular rate and rhythm, S1, S2 normal, no murmur, click, rub or gallop Abdomen: soft, non-tender; bowel sounds normal; no masses,  no organomegaly Extremities: extremities normal, atraumatic, no cyanosis or edema wrists are swollen slightly and painful to touch Pulses: 2+ and symmetric Skin: Skin color, texture, turgor normal. No rashes or lesions Neurologic: Grossly normal    Labs on Admission:   Recent Labs  12/07/15 1050  NA 134*  K 4.2  CL 102  CO2 23  GLUCOSE 127*  BUN 14  CREATININE 1.39*  CALCIUM 8.9    Recent Labs  12/07/15 1050  AST 21  ALT 15*  ALKPHOS 69  BILITOT 1.2  PROT 7.3  ALBUMIN 3.3*    Recent Labs  12/07/15 1050  LIPASE 26    Recent Labs  12/07/15 1050  WBC 4.8  NEUTROABS 3.6  HGB 9.3*  HCT 27.5*  MCV 99.6  PLT 167   Radiological Exams on Admission: Dg Chest 2 View  12/07/2015  CLINICAL DATA:  65 year old male with a history of sudden onset severe left chest pain EXAM: CHEST - 2 VIEW COMPARISON:  11/12/2015, 09/13/2015 FINDINGS: Cardiomediastinal silhouette unchanged in size and contour. Left-sided postoperative/post traumatic rib deformities are unchanged. Architectural distortion in the bilateral mid and lower lungs is unchanged. Blunting of the left costophrenic angle persists. No confluent airspace disease or pneumothorax.  No pleural effusion. Unremarkable appearance of the upper abdomen. IMPRESSION: Chronic lung changes without evidence of superimposed acute cardiopulmonary disease. Similar appearance of posttraumatic/ postsurgical deformity of the left chest wall. Signed, [name]. [name], DO [facility] Radiology Electronically Signed   By: [name] D.O.   On: 12/07/2015 11:53   Dg Lumbar Spine Complete  12/07/2015  CLINICAL DATA:  Fall on concrete 4 days ago, upper  thoracic and lumbar back pain, status post right nephrectomy EXAM: LUMBAR SPINE - COMPLETE 4+ VIEW COMPARISON:  11/12/2015 FINDINGS: Five views of lumbar spine submitted. No acute fracture or subluxation. Mild levoscoliosis. Mild disc space flattening at L4-L5 level. Mild anterior spurring upper endplate of L4 and L5 vertebral body. There is disc space flattening with endplate sclerotic changes mild anterior spurring at L5-S1 level. IMPRESSION: No acute fracture or subluxation. Mild levoscoliosis. Mild disc space flattening at L4-L5 level. Moderate disc space flattening with endplate sclerotic changes and mild anterior spurring at L5-S1 level. Electronically Signed   By: [name] M.D.   On: 12/07/2015 12:08   Ct Chest W Contrast  12/07/2015  CLINICAL DATA:  Right back pain. Chest pain. Difficulty breathing. Cyst drained from buttocks 5 weeks ago. Diarrhea. History of renal cell carcinoma. EXAM: CT CHEST, ABDOMEN, AND PELVIS WITH CONTRAST TECHNIQUE: Multidetector CT imaging of the chest, abdomen and pelvis was performed following the standard protocol during bolus administration of intravenous contrast. CONTRAST:  169mL OMNIPAQUE IOHEXOL 300 MG/ML  SOLN COMPARISON:  11/12/2015.  09/13/2015. FINDINGS: CT CHEST Tiny hypodensity in the left lobe of the thyroid gland. No abnormal mediastinal adenopathy.  No pericardial effusion. No pneumothorax or pleural effusion Focal thickening of the right minor fissure is not significantly changed. See image 36 of series 4 on coronal reformats. Scarring in the central left lower lobe extending to the left base is stable. 3 mm left upper lobe nodule on image 21 is stable. Pleural nodules in the posterior left hemi thorax on image 23 are stable. Reticulonodular opacities have developed at the posterior left lung base. No acute bony deformity. Chronic changes of the left sixth rib are noted. CT ABDOMEN AND PELVIS Hypodensity in the left lobe of the liver measuring 8 mm on image 50  is stable. Diffuse hepatic steatosis Gallbladder is unremarkable. Adrenal glands are within normal limits. There is some nodularity without focal mass on the left. Left renal mass on image 66 in the upper pole measures 4.8 cm. It is not significantly changed. Benign cyst in the upper pole of the left kidney is noted. Status post right nephrectomy. There is an ill-defined area of low density within the body of the pancreas on image 63 measuring 1.8 cm worrisome for adenocarcinoma. Spleen is upper normal in size. Appendix is within normal limits. There is diverticulosis  of the descending and sigmoid colon. There is wall thickening of the sigmoid colon. There is stranding in the adjacent fat. There is also stranding in the perirectal fat. A single air bubble is seen in the anterior right ischiorectal fossae on image 122. This is in the location of the patient's previously seen abscess. It is extraluminal. There is thickening of the wall of the distal rectum adjacent to this gas. No fluid filled abscess is identified. There is stranding in extending from this area inferiorly towards the skin of the inner right buttock. Overall, the previously described abscess has nearly resolved. Bladder is decompressed.  Prostate is unremarkable. No evidence of retroperitoneal adenopathy. IMPRESSION: There are reticulonodular opacities at the posterior left lung base worrisome for an inflammatory process. The abscess in the right ischiorectal fossa adjacent to the rectum has markedly improved. There is a small extraluminal gas bubble in the location of the previously seen abscess which is residual. There is also residual wall thickening of the adjacent rectum. There is stranding in the perirectal fat. There is mild inflammatory change of the sigmoid colon. No fluid filled abscess 1.8 cm mass in the body of the pancreas is worrisome for adenocarcinoma metastatic disease. Stable hypodensity in the left lobe of the liver this supports  benign etiology. Stable left renal mass. Electronically Signed   By: Marybelle Killings M.D.   On: 12/07/2015 18:05   Ct Abdomen Pelvis W Contrast  12/07/2015  CLINICAL DATA:  Right back pain. Chest pain. Difficulty breathing. Cyst drained from buttocks 5 weeks ago. Diarrhea. History of renal cell carcinoma. EXAM: CT CHEST, ABDOMEN, AND PELVIS WITH CONTRAST TECHNIQUE: Multidetector CT imaging of the chest, abdomen and pelvis was performed following the standard protocol during bolus administration of intravenous contrast. CONTRAST:  191mL OMNIPAQUE IOHEXOL 300 MG/ML  SOLN COMPARISON:  11/12/2015.  09/13/2015. FINDINGS: CT CHEST Tiny hypodensity in the left lobe of the thyroid gland. No abnormal mediastinal adenopathy.  No pericardial effusion. No pneumothorax or pleural effusion Focal thickening of the right minor fissure is not significantly changed. See image 36 of series 4 on coronal reformats. Scarring in the central left lower lobe extending to the left base is stable. 3 mm left upper lobe nodule on image 21 is stable. Pleural nodules in the posterior left hemi thorax on image 23 are stable. Reticulonodular opacities have developed at the posterior left lung base. No acute bony deformity. Chronic changes of the left sixth rib are noted. CT ABDOMEN AND PELVIS Hypodensity in the left lobe of the liver measuring 8 mm on image 50 is stable. Diffuse hepatic steatosis Gallbladder is unremarkable. Adrenal glands are within normal limits. There is some nodularity without focal mass on the left. Left renal mass on image 66 in the upper pole measures 4.8 cm. It is not significantly changed. Benign cyst in the upper pole of the left kidney is noted. Status post right nephrectomy. There is an ill-defined area of low density within the body of the pancreas on image 63 measuring 1.8 cm worrisome for adenocarcinoma. Spleen is upper normal in size. Appendix is within normal limits. There is diverticulosis of the descending and  sigmoid colon. There is wall thickening of the sigmoid colon. There is stranding in the adjacent fat. There is also stranding in the perirectal fat. A single air bubble is seen in the anterior right ischiorectal fossae on image 122. This is in the location of the patient's previously seen abscess. It is extraluminal. There is thickening of  the wall of the distal rectum adjacent to this gas. No fluid filled abscess is identified. There is stranding in extending from this area inferiorly towards the skin of the inner right buttock. Overall, the previously described abscess has nearly resolved. Bladder is decompressed.  Prostate is unremarkable. No evidence of retroperitoneal adenopathy. IMPRESSION: There are reticulonodular opacities at the posterior left lung base worrisome for an inflammatory process. The abscess in the right ischiorectal fossa adjacent to the rectum has markedly improved. There is a small extraluminal gas bubble in the location of the previously seen abscess which is residual. There is also residual wall thickening of the adjacent rectum. There is stranding in the perirectal fat. There is mild inflammatory change of the sigmoid colon. No fluid filled abscess 1.8 cm mass in the body of the pancreas is worrisome for adenocarcinoma metastatic disease. Stable hypodensity in the left lobe of the liver this supports benign etiology. Stable left renal mass. Electronically Signed   By: Marybelle Killings M.D.   On: 12/07/2015 18:05   Old chart reviewed  Assessment/Plan  65 yo male h/o RCC here with bilateral wrist pain and fever  Principal Problem:   Fever- could be from gout flare.  However due to pt high risk will empirically cover with vanc/zosyn for questionable pna.  ua neg.  Flu pending.    Active Problems:   Gouty arthritis- check uric acid level.  Provide indomethacin temporarily (pt reports takes this with his gout flares s/p nephrectomy).   Renal cell cancer ([place])- noted   DJD  (degenerative joint disease)- noted   ETOH abuse- check uds and etoh level   Anemia of chronic disease- stable and at baseline   Chronic pain - pt reports he has not had his pain meds during his 12 hour ER course and usually gets it every 4 hours.  Will provide him with his pain meds at this time in the form of percocet 5mg .   Fall- noted  obs on medical.  Full code.  Jeffrey Trujillo A 12/07/2015, 10:50 PM

## 2015-12-07 NOTE — Progress Notes (Addendum)
Pharmacy Note:  Initial antibiotics for Vancomycin and Zosyn ordered by EDP for HCAP.  Estimated Creatinine Clearance: 57.2 mL/min (by C-G formula based on Cr of 1.39).   Allergies  Allergen Reactions  . Amoxicillin Nausea And Vomiting  . Nsaids Other (See Comments)    Patient only has one kidney.   . Demerol [Meperidine] Rash  . Meperidine Hcl Hives and Rash    Filed Vitals:   12/07/15 1930 12/07/15 2000  BP: 141/77 127/68  Pulse: 66 61  Temp:    Resp: 19 17    Anti-infectives    Start     Dose/Rate Route Frequency Ordered Stop   12/07/15 2100  vancomycin (VANCOCIN) IVPB 1000 mg/200 mL premix     1,000 mg 200 mL/hr over 60 Minutes Intravenous  Once 12/07/15 2050     12/07/15 2100  piperacillin-tazobactam (ZOSYN) IVPB 3.375 g     3.375 g 100 mL/hr over 30 Minutes Intravenous  Once 12/07/15 2050        Plan: Initial doses of Vancomycin and Zosyn X 1 ordered. F/U admission orders for further dosing if therapy continued. Amoxicillin allergy noted, tolerated Zosyn during admission last month.  Pricilla Larsson, [facility] 12/07/2015 8:51 PM  PM:  Admitting MD stopped Vancomycin, Zosyn and renal adjust ABX still active. H&P Pending. Zosyn 3.375gm IV every 8 hours. Follow-up micro data, labs, vitals.   Pricilla Larsson, [facility]  12/07/2015 10:28 PM  Addum:  Plan to continue vanc as well.  Vanc 750 mg IV q12 hours Excell Seltzer, PharmD

## 2015-12-08 ENCOUNTER — Encounter (HOSPITAL_COMMUNITY): Payer: Self-pay | Admitting: Gastroenterology

## 2015-12-08 DIAGNOSIS — K869 Disease of pancreas, unspecified: Secondary | ICD-10-CM | POA: Diagnosis not present

## 2015-12-08 DIAGNOSIS — M1009 Idiopathic gout, multiple sites: Secondary | ICD-10-CM | POA: Diagnosis not present

## 2015-12-08 DIAGNOSIS — F1010 Alcohol abuse, uncomplicated: Secondary | ICD-10-CM | POA: Diagnosis not present

## 2015-12-08 DIAGNOSIS — C642 Malignant neoplasm of left kidney, except renal pelvis: Secondary | ICD-10-CM

## 2015-12-08 DIAGNOSIS — R509 Fever, unspecified: Secondary | ICD-10-CM | POA: Diagnosis not present

## 2015-12-08 DIAGNOSIS — D638 Anemia in other chronic diseases classified elsewhere: Secondary | ICD-10-CM | POA: Diagnosis not present

## 2015-12-08 LAB — C DIFFICILE QUICK SCREEN W PCR REFLEX
C DIFFICILE (CDIFF) TOXIN: NEGATIVE
C DIFFICLE (CDIFF) ANTIGEN: NEGATIVE
C Diff interpretation: NEGATIVE

## 2015-12-08 LAB — BASIC METABOLIC PANEL
Anion gap: 7 (ref 5–15)
BUN: 17 mg/dL (ref 6–20)
CO2: 25 mmol/L (ref 22–32)
CREATININE: 1.71 mg/dL — AB (ref 0.61–1.24)
Calcium: 8.1 mg/dL — ABNORMAL LOW (ref 8.9–10.3)
Chloride: 100 mmol/L — ABNORMAL LOW (ref 101–111)
GFR, EST AFRICAN AMERICAN: 47 mL/min — AB (ref >60)
GFR, EST NON AFRICAN AMERICAN: 41 mL/min — AB (ref >60)
Glucose, Bld: 119 mg/dL — ABNORMAL HIGH (ref 65–99)
POTASSIUM: 4.2 mmol/L (ref 3.5–5.1)
SODIUM: 132 mmol/L — AB (ref 135–145)

## 2015-12-08 LAB — STREP PNEUMONIAE URINARY ANTIGEN: Strep Pneumo Urinary Antigen: NEGATIVE

## 2015-12-08 LAB — CBC
HCT: 23.1 % — ABNORMAL LOW (ref 39.0–52.0)
HEMOGLOBIN: 7.7 g/dL — AB (ref 13.0–17.0)
MCH: 32.6 pg (ref 26.0–34.0)
MCHC: 33.3 g/dL (ref 30.0–36.0)
MCV: 97.9 fL (ref 78.0–100.0)
PLATELETS: 115 10*3/uL — AB (ref 150–400)
RBC: 2.36 MIL/uL — AB (ref 4.22–5.81)
RDW: 17.6 % — ABNORMAL HIGH (ref 11.5–15.5)
WBC: 4.1 10*3/uL (ref 4.0–10.5)

## 2015-12-08 LAB — RETICULOCYTES
RBC.: 2.33 MIL/uL — ABNORMAL LOW (ref 4.22–5.81)
Retic Count, Absolute: 79.2 10*3/uL (ref 19.0–186.0)
Retic Ct Pct: 3.4 % — ABNORMAL HIGH (ref 0.4–3.1)

## 2015-12-08 LAB — URIC ACID: URIC ACID, SERUM: 8.1 mg/dL — AB (ref 4.4–7.6)

## 2015-12-08 LAB — INFLUENZA PANEL BY PCR (TYPE A & B)
H1N1 flu by pcr: NOT DETECTED
Influenza A By PCR: NEGATIVE
Influenza B By PCR: NEGATIVE

## 2015-12-08 MED ORDER — PREDNISONE 20 MG PO TABS
40.0000 mg | ORAL_TABLET | Freq: Every day | ORAL | Status: DC
  Administered 2015-12-09: 40 mg via ORAL
  Filled 2015-12-08: qty 2

## 2015-12-08 MED ORDER — VANCOMYCIN HCL IN DEXTROSE 750-5 MG/150ML-% IV SOLN
750.0000 mg | Freq: Two times a day (BID) | INTRAVENOUS | Status: DC
  Administered 2015-12-08: 750 mg via INTRAVENOUS
  Filled 2015-12-08 (×3): qty 150

## 2015-12-08 MED ORDER — LOPERAMIDE HCL 2 MG PO CAPS
4.0000 mg | ORAL_CAPSULE | Freq: Once | ORAL | Status: AC
  Administered 2015-12-08: 4 mg via ORAL
  Filled 2015-12-08: qty 2

## 2015-12-08 MED ORDER — PREDNISONE 20 MG PO TABS
60.0000 mg | ORAL_TABLET | Freq: Every day | ORAL | Status: DC
  Administered 2015-12-08: 60 mg via ORAL
  Filled 2015-12-08: qty 3

## 2015-12-08 MED ORDER — METHYLPREDNISOLONE SODIUM SUCC 125 MG IJ SOLR
60.0000 mg | Freq: Once | INTRAMUSCULAR | Status: AC
  Administered 2015-12-08: 60 mg via INTRAVENOUS
  Filled 2015-12-08: qty 2

## 2015-12-08 MED ORDER — LOPERAMIDE HCL 2 MG PO CAPS: 2.0000 mg | ORAL_CAPSULE | ORAL | Status: DC | PRN

## 2015-12-08 MED ORDER — ENSURE ENLIVE PO LIQD
237.0000 mL | Freq: Two times a day (BID) | ORAL | Status: DC
  Administered 2015-12-08: 237 mL via ORAL

## 2015-12-08 NOTE — Consult Note (Signed)
Referring Provider: Eugenie Filler, MD Primary Care Physician:  [name], MD Primary Gastroenterologist:  Garfield Cornea, MD  Reason for Consultation:  Pancreatic mass  HPI: Jeffrey Trujillo is a 65 y.o. 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 Patient presents to ER due to gouty pain. Recent admission for perirectal abscess requiring I+D. Patient states he has been "down" since then. Rectal pain is improving. Continues to have drainage from I+D site. Patient states he has diarrhea during last hospitalization that had improved but has had some recurrence since this admission. Seems to get better off antibiotics. His Cdiff antigen was positive but his toxin was negative. Small amount of rectal bleeding. No melena. No abdominal pain. Appetite poor with gouty pain. No vomiting. No heartburn.   Patient noted to have a temp in the ER of 101. CT chest shows possible pneumonia. The abscess in the right ischiorectal fossa adjacent to the rectum has markedly improved. There is a small extraluminal gas bubble in the location of the previously seen abscess which is residual. There is also residual wall thickening of the adjacent rectum. There is stranding in the perirectal fat. There is mild inflammatory change of the sigmoid colon. No fluid filled abscess 1.8 cm mass in the body of the pancreas is worrisome for adenocarcinoma metastatic disease.   Prior to Admission medications   Medication Sig Start Date End Date Taking? Authorizing Provider  diphenhydrAMINE (BENADRYL) 25 MG tablet Take 25 mg by mouth daily as needed for itching.   Yes Historical Provider, MD  indomethacin (INDOCIN) 50 MG capsule Take 50 mg by mouth 3 (three) times daily.  07/05/15  Yes Historical Provider, MD  metroNIDAZOLE (FLAGYL) 500 MG tablet Take 1 tablet (500  mg total) by mouth every 8 (eight) hours. 11/18/15  Yes Nita Sells, MD  pantoprazole (PROTONIX) 40 MG tablet TAKE (1) TABLET BY MOUTH TWICE A DAY BEFORE MEALS. (BREAKFAST AND SUPPER) 04/26/15  Yes Carlis Stable, NP  pazopanib (VOTRIENT) 200 MG tablet Take 2 tablets by mouth, daily on an empty stomach 10/06/15  Yes [name], MD  topiramate (TOPAMAX) 25 MG tablet Take 25 mg by mouth 2 (two) times daily.   Yes Historical Provider, MD  oxyCODONE-acetaminophen (PERCOCET/ROXICET) 5-325 MG tablet Take 1-2 tablets by mouth every 4 (four) hours as needed for moderate pain. Patient not taking: Reported on 12/07/2015 11/18/15   Nita Sells, MD  oxyCODONE-acetaminophen (PERCOCET/ROXICET) 5-325 MG tablet Take 1-2 tablets by mouth every 6 (six) hours as needed for severe pain. 12/07/15   Fredia Sorrow, MD  zolpidem (AMBIEN) 5 MG tablet Take 1 tablet (5 mg total) by mouth at bedtime as needed for sleep. Patient not taking: Reported on 12/07/2015 11/18/15   Nita Sells, MD    Current Facility-Administered Medications  Medication Dose Route Frequency Provider Last Rate Last Dose  . 0.9 %  sodium chloride infusion  250 mL Intravenous PRN Phillips Grout, MD      . feeding supplement (ENSURE ENLIVE) (ENSURE ENLIVE) liquid 237 mL  237 mL Oral BID BM Milly Jakob, MD      . oxyCODONE-acetaminophen (PERCOCET/ROXICET) 5-325 MG per tablet 2 tablet  2 tablet Oral Q4H PRN Phillips Grout, MD   2 tablet at 12/08/15 1226  . piperacillin-tazobactam (ZOSYN) IVPB 3.375 g  3.375 g Intravenous Q8H Rachal  Rayvon Char, MD   3.375 g at 12/08/15 1222  . [START ON 12/09/2015] predniSONE (DELTASONE) tablet 40 mg  40 mg Oral QAC breakfast Irine Seal V, MD      . sodium chloride flush (NS) 0.9 % injection 3 mL  3 mL Intravenous Q12H Phillips Grout, MD   3 mL at 12/07/15 2314  . sodium chloride flush (NS) 0.9 % injection 3 mL  3 mL Intravenous PRN Phillips Grout, MD        Allergies as of 12/07/2015 - Review Complete  12/07/2015  Allergen Reaction Noted  . Amoxicillin Nausea And Vomiting 02/11/2015  . Nsaids Other (See Comments) 06/05/2011  . Demerol [meperidine] Rash 01/11/2014  . Meperidine hcl Hives and Rash 01/20/2015    Past Medical History  Diagnosis Date  . Gout   . DJD (degenerative joint disease) of cervical spine   . Diverticulosis of colon     by colonoscopy 11/02/2009  . Barrett's esophagus     by EGD 11/02/2009  . Anemia   . Cancer [facility])     "was told has cancer on other kidney and spot on pancreas"  . 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 GERD (gastroesophageal reflux disease)     Past Surgical History  Procedure Laterality Date  . Nephrectomy      right due to cancer  . Lung removal, partial      left lower lobe, benign  . Eus  08/07/2012    Procedure: UPPER ENDOSCOPIC ULTRASOUND (EUS) LINEAR;  Surgeon: [name], MD;  Location: [facility];  Service: Endoscopy;  Laterality: N/A;  . Inguinal hernia repair    . Hernia repair    . Inguinal hernia repair      Right  . Eus  08/21/2012    Procedure: UPPER ENDOSCOPIC ULTRASOUND (EUS) LINEAR;  Surgeon: [name], MD;  Location: [facility];  Service: Endoscopy;  Laterality: N/A;  . Colonoscopy  Feb 2011    [name]: single anal papilla, pan-colonic diverticula  . Esophagogastroduodenoscopy  Feb 2011    [name]: salmon-colored epithelium consistent with Barrett's, nodular antral erosions, small hiatal hernia, negative H.pylori  . Colonoscopy  09/25/2012    RMR: Colonic diverticulosis. Suspect diverticular bleeding-hepatic flexure tic-sealed  . Esophagogastroduodenoscopy  09/25/2012    BWI:OMBTDHR'C esophagus. Small hiatal hernia. Antral erosions of doubtful clinical significance, no biopsies  . Esophagogastroduodenoscopy N/A 03/11/2013    SLF: Barrett's esophagus/Single ulcer in the gastric antrum-PROBABLE SOURCE FOR BLACK STOOL/ Chronic gastritis in the gastric antrum  . Esophagogastroduodenoscopy N/A  10/11/2014    Dr. [name] barrett's s/p biopsy. Hiatal hernia. Biopsy with intestinal metaplasia, glandular atypia indefinite for low grade dysplasia noted as well  . Esophagogastroduodenoscopy N/A 01/19/2015    [name]: Barrett's no dysplasia  . Incision and drainage abscess N/A 11/13/2015    Procedure: INCISION AND DRAINAGE ABSCESS;  Surgeon: Aviva Signs, MD;  Location: [facility];  Service: General;  Laterality: N/A;    Family History  Problem Relation Age of Onset  . Lung cancer Brother   . Prostate cancer Brother   . Colon cancer Neg Hx   . Lumbar disc disease Brother   . Prostate cancer Brother     Social History   Social History  . Marital Status: Married    Spouse Name: N/A  . Number of Children: N/A  . Years of Education: N/A   Occupational History  .  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 Smoking status: Former Smoker -- 0.25 packs/day for 4 years    Types: Cigars  . Smokeless tobacco: Never Used     Comment: occasional cigar  . Alcohol Use: 1.2 - 1.8 oz/week    2-3 Cans of beer per week     Comment: vodka and budweiser, sometimes daily, depends on notion. couple drinks. 2-3 beers on a weekend. pt says no etoh in 3 months  . Drug Use: No  . Sexual Activity: Yes   Other Topics Concern  . Not on file   Social History Narrative     ROS:  General: Negative for anorexia, weight loss, chills. +fever, fatigue, weakness. Eyes: Negative for vision changes.  ENT: Negative for hoarseness, difficulty swallowing , nasal congestion. CV: Negative for chest pain, angina, palpitations, dyspnea on exertion, peripheral edema.  Respiratory: Negative for dyspnea at rest, dyspnea on exertion, cough, sputum, wheezing.  GI: See history of present illness. GU:  Negative for dysuria, hematuria, urinary incontinence, urinary frequency, nocturnal urination.  MS: +for joint pain, low back pain.  Derm: Negative for rash or itching.  Neuro: Negative for  weakness, abnormal sensation, seizure, frequent headaches, memory loss, confusion.  Psych: Negative for anxiety, depression, suicidal ideation, hallucinations.  Endo: Negative for unusual weight change.  Heme: Negative for bruising or bleeding. Allergy: Negative for rash or hives.       Physical Examination: Vital signs in last 24 hours: Temp:  [97.4 F (36.3 C)-101.4 F (38.6 C)] 97.4 F (36.3 C) (03/23 0500) Pulse Rate:  [61-99] 71 (03/23 0500) Resp:  [14-30] 18 (03/23 0500) BP: (108-169)/(58-94) 114/58 mmHg (03/23 0500) SpO2:  [95 %-100 %] 98 % (03/23 0500) Weight:  [161 lb 9.6 oz (73.3 kg)] 161 lb 9.6 oz (73.3 kg) (03/22 2202) Last BM Date: 12/08/15  General: Well-nourished, well-developed in no acute distress.  Head: Normocephalic, atraumatic.   Eyes: Conjunctiva pink, no icterus. Mouth: Oropharyngeal mucosa moist and pink , no lesions erythema or exudate. Neck: Supple without thyromegaly, masses, or lymphadenopathy.  Lungs: Clear to auscultation bilaterally.  Heart: Regular rate and rhythm, no murmurs rubs or gallops.  Abdomen: Bowel sounds are normal, nontender, nondistended, no hepatosplenomegaly or masses, no abdominal bruits or    hernia , no rebound or guarding.   Rectal: not performed Extremities: No lower extremity edema, clubbing, deformity. Wrist swollen, red/tender Neuro: Alert and oriented x 4 , grossly normal neurologically.  Skin: Warm and dry, no rash or jaundice.   Psych: Alert and cooperative, normal mood and affect.        Intake/Output from previous day: 03/22 0701 - 03/23 0700 In: -  Out: 150 [Urine:150] Intake/Output this shift:    Lab Results: CBC  Recent Labs  12/07/15 1050 12/08/15 0946  WBC 4.8 4.1  HGB 9.3* 7.7*  HCT 27.5* 23.1*  MCV 99.6 97.9  PLT 167 115*   BMET  Recent Labs  12/07/15 1050 12/08/15 0946  NA 134* 132*  K 4.2 4.2  CL 102 100*  CO2 23 25  GLUCOSE 127* 119*  BUN 14 17  CREATININE 1.39* 1.71*  CALCIUM 8.9  8.1*   LFT  Recent Labs  12/07/15 1050  BILITOT 1.2  ALKPHOS 69  AST 21  ALT 15*  PROT 7.3  ALBUMIN 3.3*    Lipase  Recent Labs  12/07/15 1050  LIPASE 26    PT/INR No results for input(s): LABPROT, INR in the last 72  hours.    Imaging Studies: Ct Abdomen Pelvis Wo Contrast  11/12/2015  CLINICAL DATA:  65 year old male with abdominal and back pain and rectal pain and fullness. EXAM: CT ABDOMEN AND PELVIS WITHOUT CONTRAST TECHNIQUE: Multidetector CT imaging of the abdomen and pelvis was performed following the standard protocol without IV contrast. COMPARISON:  09/13/2015 and prior CTs FINDINGS: Please note that parenchymal abnormalities may be missed without intravenous contrast. Lower chest:  A trace left pleural effusion is noted. Hepatobiliary: The liver and gallbladder are unremarkable. There is no evidence of biliary dilatation. Pancreas: Unremarkable Spleen: Upper limits of normal sized Adrenals/Urinary Tract: A mass within the upper left kidney is grossly unchanged. An upper pole cyst is again noted. Patient is status post right nephrectomy. The adrenal glands and bladder are unchanged. Stomach/Bowel: A 4 x 2.3 x 10.5 cm (AP x transverse x CC) right perirectal abscess is identified and extends from the of the mid-distal rectum to the inner right gluteal region. Focal right rectal wall thickening is identified (image 85). There is no evidence of bowel obstruction, pneumoperitoneum or other definite bowel wall thickening. Vascular/Lymphatic: No evidence of abdominal aortic aneurysm or enlarged lymph nodes. Reproductive: Mild prostate enlargement noted. Other: No free fluid. Musculoskeletal: No acute abnormalities identified. IMPRESSION: 4 x 2.8 x 10.5 cm right perirectal abscess with focal right rectal wall thickening. Grossly unchanged left upper renal mass. Trace left pleural effusion. Electronically Signed   By: Margarette Canada M.D.   On: 11/12/2015 10:52   Dg Chest 2  View  12/07/2015  CLINICAL DATA:  65 year old male with a history of sudden onset severe left chest pain EXAM: CHEST - 2 VIEW COMPARISON:  11/12/2015, 09/13/2015 FINDINGS: Cardiomediastinal silhouette unchanged in size and contour. Left-sided postoperative/post traumatic rib deformities are unchanged. Architectural distortion in the bilateral mid and lower lungs is unchanged. Blunting of the left costophrenic angle persists. No confluent airspace disease or pneumothorax.  No pleural effusion. Unremarkable appearance of the upper abdomen. IMPRESSION: Chronic lung changes without evidence of superimposed acute cardiopulmonary disease. Similar appearance of posttraumatic/ postsurgical deformity of the left chest wall. Signed, [name]. [name], DO [facility] Electronically Signed   By: [name] D.O.   On: 12/07/2015 11:53   Dg Chest 2 View  11/12/2015  CLINICAL DATA:  Rectal cancer, unspecified laterality.  Back pain. EXAM: CHEST  2 VIEW COMPARISON:  01/03/2015 FINDINGS: Chronic blunting at the left costophrenic angle. Old deformity of the left sixth rib. Lungs are clear without airspace disease or pulmonary edema. Heart and mediastinum are stable and within normal limits. No pleural effusions. IMPRESSION: No active cardiopulmonary disease.- Electronically Signed   By: Markus Daft M.D.   On: 11/12/2015 14:20   Dg Lumbar Spine Complete  12/07/2015  CLINICAL DATA:  Fall on concrete 4 days ago, upper thoracic and lumbar back pain, status post right nephrectomy EXAM: LUMBAR SPINE - COMPLETE 4+ VIEW COMPARISON:  11/12/2015 FINDINGS: Five views of lumbar spine submitted. No acute fracture or subluxation. Mild levoscoliosis. Mild disc space flattening at L4-L5 level. Mild anterior spurring upper endplate of L4 and L5 vertebral body. There is disc space flattening with endplate sclerotic changes mild anterior spurring at L5-S1 level. IMPRESSION: No acute  fracture or subluxation. Mild levoscoliosis. Mild disc space flattening at L4-L5 level. Moderate disc space flattening with endplate sclerotic changes and mild anterior spurring at L5-S1 level. Electronically Signed   By: [name] M.D.   On: 12/07/2015 12:08  Dg Lumbar Spine Complete  11/09/2015  CLINICAL DATA:  65 year old male with acute lumbar pain for 3 days with no known injury. Initial encounter. EXAM: LUMBAR SPINE - COMPLETE 4+ VIEW COMPARISON:  CT chest, Abdomen and Pelvis 09/13/2015 FINDINGS: Normal lumbar segmentation. Stable vertebral height and alignment, trace anterolisthesis at L4-L5. Preserved disc spaces except at L5-S1 were there is chronic disc space loss and vacuum disc. Moderate facet hypertrophy from the L4 level to the sacrum re- demonstrated, L4-L5 vacuum facet phenomena previously by CT. No pars fracture. Visible lower thoracic levels appear intact. Sacral ala and SI joints within normal limits. Stable pelvic phleboliths. Stable dystrophic calcifications about the right adrenal gland, and the right kidney is seen to be surgically or congenitally absent on the comparison. IMPRESSION: 1.  No acute osseous abnormality identified in lumbar spine. 2. Chronically advanced L5-S1 disc degeneration, and L4-L5 facet degeneration. Electronically Signed   By: Genevie Ann M.D.   On: 11/09/2015 08:27   Ct Chest W Contrast  12/07/2015  CLINICAL DATA:  Right back pain. Chest pain. Difficulty breathing. Cyst drained from buttocks 5 weeks ago. Diarrhea. History of renal cell carcinoma. EXAM: CT CHEST, ABDOMEN, AND PELVIS WITH CONTRAST TECHNIQUE: Multidetector CT imaging of the chest, abdomen and pelvis was performed following the standard protocol during bolus administration of intravenous contrast. CONTRAST:  156m OMNIPAQUE IOHEXOL 300 MG/ML  SOLN COMPARISON:  11/12/2015.  09/13/2015. FINDINGS: CT CHEST Tiny hypodensity in the left lobe of the thyroid gland. No abnormal mediastinal adenopathy.  No  pericardial effusion. No pneumothorax or pleural effusion Focal thickening of the right minor fissure is not significantly changed. See image 36 of series 4 on coronal reformats. Scarring in the central left lower lobe extending to the left base is stable. 3 mm left upper lobe nodule on image 21 is stable. Pleural nodules in the posterior left hemi thorax on image 23 are stable. Reticulonodular opacities have developed at the posterior left lung base. No acute bony deformity. Chronic changes of the left sixth rib are noted. CT ABDOMEN AND PELVIS Hypodensity in the left lobe of the liver measuring 8 mm on image 50 is stable. Diffuse hepatic steatosis Gallbladder is unremarkable. Adrenal glands are within normal limits. There is some nodularity without focal mass on the left. Left renal mass on image 66 in the upper pole measures 4.8 cm. It is not significantly changed. Benign cyst in the upper pole of the left kidney is noted. Status post right nephrectomy. There is an ill-defined area of low density within the body of the pancreas on image 63 measuring 1.8 cm worrisome for adenocarcinoma. Spleen is upper normal in size. Appendix is within normal limits. There is diverticulosis of the descending and sigmoid colon. There is wall thickening of the sigmoid colon. There is stranding in the adjacent fat. There is also stranding in the perirectal fat. A single air bubble is seen in the anterior right ischiorectal fossae on image 122. This is in the location of the patient's previously seen abscess. It is extraluminal. There is thickening of the wall of the distal rectum adjacent to this gas. No fluid filled abscess is identified. There is stranding in extending from this area inferiorly towards the skin of the inner right buttock. Overall, the previously described abscess has nearly resolved. Bladder is decompressed.  Prostate is unremarkable. No evidence of retroperitoneal adenopathy. IMPRESSION: There are reticulonodular  opacities at the posterior left lung base worrisome for an inflammatory process. The abscess in the right  ischiorectal fossa adjacent to the rectum has markedly improved. There is a small extraluminal gas bubble in the location of the previously seen abscess which is residual. There is also residual wall thickening of the adjacent rectum. There is stranding in the perirectal fat. There is mild inflammatory change of the sigmoid colon. No fluid filled abscess 1.8 cm mass in the body of the pancreas is worrisome for adenocarcinoma metastatic disease. Stable hypodensity in the left lobe of the liver this supports benign etiology. Stable left renal mass. Electronically Signed   By: Marybelle Killings M.D.   On: 12/07/2015 18:05   Ct Abdomen Pelvis W Contrast  12/07/2015  CLINICAL DATA:  Right back pain. Chest pain. Difficulty breathing. Cyst drained from buttocks 5 weeks ago. Diarrhea. History of renal cell carcinoma. EXAM: CT CHEST, ABDOMEN, AND PELVIS WITH CONTRAST TECHNIQUE: Multidetector CT imaging of the chest, abdomen and pelvis was performed following the standard protocol during bolus administration of intravenous contrast. CONTRAST:  167m OMNIPAQUE IOHEXOL 300 MG/ML  SOLN COMPARISON:  11/12/2015.  09/13/2015. FINDINGS: CT CHEST Tiny hypodensity in the left lobe of the thyroid gland. No abnormal mediastinal adenopathy.  No pericardial effusion. No pneumothorax or pleural effusion Focal thickening of the right minor fissure is not significantly changed. See image 36 of series 4 on coronal reformats. Scarring in the central left lower lobe extending to the left base is stable. 3 mm left upper lobe nodule on image 21 is stable. Pleural nodules in the posterior left hemi thorax on image 23 are stable. Reticulonodular opacities have developed at the posterior left lung base. No acute bony deformity. Chronic changes of the left sixth rib are noted. CT ABDOMEN AND PELVIS Hypodensity in the left lobe of the liver  measuring 8 mm on image 50 is stable. Diffuse hepatic steatosis Gallbladder is unremarkable. Adrenal glands are within normal limits. There is some nodularity without focal mass on the left. Left renal mass on image 66 in the upper pole measures 4.8 cm. It is not significantly changed. Benign cyst in the upper pole of the left kidney is noted. Status post right nephrectomy. There is an ill-defined area of low density within the body of the pancreas on image 63 measuring 1.8 cm worrisome for adenocarcinoma. Spleen is upper normal in size. Appendix is within normal limits. There is diverticulosis of the descending and sigmoid colon. There is wall thickening of the sigmoid colon. There is stranding in the adjacent fat. There is also stranding in the perirectal fat. A single air bubble is seen in the anterior right ischiorectal fossae on image 122. This is in the location of the patient's previously seen abscess. It is extraluminal. There is thickening of the wall of the distal rectum adjacent to this gas. No fluid filled abscess is identified. There is stranding in extending from this area inferiorly towards the skin of the inner right buttock. Overall, the previously described abscess has nearly resolved. Bladder is decompressed.  Prostate is unremarkable. No evidence of retroperitoneal adenopathy. IMPRESSION: There are reticulonodular opacities at the posterior left lung base worrisome for an inflammatory process. The abscess in the right ischiorectal fossa adjacent to the rectum has markedly improved. There is a small extraluminal gas bubble in the location of the previously seen abscess which is residual. There is also residual wall thickening of the adjacent rectum. There is stranding in the perirectal fat. There is mild inflammatory change of the sigmoid colon. No fluid filled abscess 1.8 cm mass in the body  of the pancreas is worrisome for adenocarcinoma metastatic disease. Stable hypodensity in the left lobe of  the liver this supports benign etiology. Stable left renal mass. Electronically Signed   By: Marybelle Killings M.D.   On: 12/07/2015 18:05   Dg Chest Port 1 View  11/13/2015  CLINICAL DATA:  PICC placement. EXAM: PORTABLE CHEST 1 VIEW COMPARISON:  Earlier this day at 1402 hour FINDINGS: Right-sided central line, likely from an internal jugular approach, tip in the mid SVC. No pneumothorax. The cardiomediastinal contours are normal. Chronic blunting of left costophrenic angle. Minimal right basilar atelectasis. Pulmonary vasculature is normal. No consolidation, pleural effusion, or pneumothorax. No acute osseous abnormalities are seen. Left posterior rib deformity again seen. IMPRESSION: Tip of the right central line in the mid SVC.  No pneumothorax. Electronically Signed   By: Jeb Levering M.D.   On: 11/13/2015 01:05  [4 week]   Impression: 65 year old gentleman with history of right renal cell carcinoma status post nephrectomy several years ago followed by left renal cell carcinoma with metastasis to the pancreas being managed with oral chemotherapy, most recent imaging studies showing stable disease and regression of pancreatic lesion, who presents with fever, gouty pain. Pancreatic lesion has not shown up on the last 2 noncontrast CT scans in February 2017, December 2016. It did show up on the CT of the abdomen pelvis with contrast back in September 2016 as well as nicely detailed on previous MRIs. Prior EUSs with FNAs most c/w met from renal cell ca but pancreatic adenocarcinoma not entirely excluded per path report. Now with pancreatic body lesion on current CT.   CT also with sigmoid colon mild inflammatory change, overall improved perirectal abscess. Patient with loose stool on antibiotics.   Plan: 1. Agree with rechecking for Cdiff.  2. Patient may require EUS but initially review imaging with radiologist to determine if this is a "new" pancreatic finding. Further recommendations to follow.    We would like to thank you for the opportunity to participate in the care of RAJAN BURGARD.  Laureen Ochs. Bernarda Caffey [facility] [phone] 3/23/20172:31 PM

## 2015-12-08 NOTE — Progress Notes (Signed)
[name]  Jeffrey Trujillo [id number] DOB: [birth year] DOA: 12/07/2015 PCP: [name], MD  Assessment/Plan: #1 acute gouty flare Patient presented with fever, bilateral wrist pain, right elbow pain, right knee pain. Uric acid level is elevated. Clinical findings consistent with acute gouty flare. Some clinical improvement. Will discontinue indomethacin due to patient's history of renal cell carcinoma status post nephrectomy. Place on the prednisone. Supportive care. Follow.  #2 fever Likely secondary to problem #1. There was concern for possible pneumonia have a patient with no rest or symptoms. CT of the chest showing rectal kilo nodular opacities in the posterior left lung base concerning for possible inflammatory process. Will discontinue IV antibiotics as I doubt if patient does have a pneumonia and will monitor.  #3 history of alcohol abuse Currently stable. UDS negative. Follow.  #4 pancreatic mass Noted on CT abdomen and pelvis. Patient with prior history of renal cell carcinoma with metastases and a prior history of a pancreatic lesion which had seemed to have resolved the last CT of December 2016. Due to patient's history of renal cell carcinoma and prior history of pancreatic lesion will consult with GI for further evaluation and management. Monitor for now.  #5 Anemia Labs pending.  #6 Chronic pain Continue pain regimen.  #7 [facility] Outpatient follow up.  Code Status: Full Family Communication: Updated patient. No family at bedside. Disposition Plan: Home was medically stable with improvement in acute gouty arthritis.   Consultants:  GI pending  Procedures:  CT chest 12/07/2015  CT abdomen and pelvis 12/07/2015  Antibiotics:  IV vancomycin 12/07/2015>>>> 12/08/2015  IV Zosyn 12/07/2015  HPI/Subjective: Patient states diffuse joint pain with some improvement. Patient denies any cough. Patient denies any shortness of breath. Patient  denies any abdominal pain. Patient states had one loose stool this morning.  Objective: Filed Vitals:   12/07/15 2202 12/08/15 0500  BP: 152/87 114/58  Pulse: 99 71  Temp: 98 F (36.7 C) 97.4 F (36.3 C)  Resp: 16 18    Intake/Output Summary (Last 24 hours) at 12/08/15 0945 Last data filed at 12/08/15 0403  Gross per 24 hour  Intake      0 ml  Output    150 ml  Net   -150 ml   Filed Weights   12/07/15 0904 12/07/15 2202  Weight: 77.111 kg (170 lb) 73.3 kg (161 lb 9.6 oz)    Exam:   General:  NAD  Cardiovascular: RRR  Respiratory: CTAB  Abdomen: Soft, nontender, nondistended, positive bowel sounds.  Musculoskeletal: No clubbing cyanosis or edema. Patient with decreased swelling and pain to bilateral wrist and right elbow and right knee.  Data Reviewed: Basic Metabolic Panel:  Recent Labs Lab 12/07/15 1050  NA 134*  K 4.2  CL 102  CO2 23  GLUCOSE 127*  BUN 14  CREATININE 1.39*  CALCIUM 8.9   Liver Function Tests:  Recent Labs Lab 12/07/15 1050  AST 21  ALT 15*  ALKPHOS 69  BILITOT 1.2  PROT 7.3  ALBUMIN 3.3*    Recent Labs Lab 12/07/15 1050  LIPASE 26   No results for input(s): AMMONIA in the last 168 hours. CBC:  Recent Labs Lab 12/07/15 1050  WBC 4.8  NEUTROABS 3.6  HGB 9.3*  HCT 27.5*  MCV 99.6  PLT 167   Cardiac Enzymes: No results for input(s): CKTOTAL, CKMB, CKMBINDEX, TROPONINI in the last 168 hours. BNP (last 3 results)  Recent Labs  01/03/15 1043  BNP 57.0  ProBNP (last 3 results) No results for input(s): PROBNP in the last 8760 hours.  CBG: No results for input(s): GLUCAP in the last 168 hours.  Recent Results (from the past 240 hour(s))  Culture, blood (routine x 2) Call MD if unable to obtain prior to antibiotics being given     Status: None (Preliminary result)   Collection Time: 12/07/15 10:35 PM  Result Value Ref Range Status   Specimen Description BLOOD RIGHT ANTECUBITAL  Final   Special Requests    Final    BOTTLES DRAWN AEROBIC AND ANAEROBIC AEB 4CC ANA 2CC   Culture PENDING  Incomplete   Report Status PENDING  Incomplete     Studies: Dg Chest 2 View  12/07/2015  CLINICAL DATA:  65 year old male with a history of sudden onset severe left chest pain EXAM: CHEST - 2 VIEW COMPARISON:  11/12/2015, 09/13/2015 FINDINGS: Cardiomediastinal silhouette unchanged in size and contour. Left-sided postoperative/post traumatic rib deformities are unchanged. Architectural distortion in the bilateral mid and lower lungs is unchanged. Blunting of the left costophrenic angle persists. No confluent airspace disease or pneumothorax.  No pleural effusion. Unremarkable appearance of the upper abdomen. IMPRESSION: Chronic lung changes without evidence of superimposed acute cardiopulmonary disease. Similar appearance of posttraumatic/ postsurgical deformity of the left chest wall. Signed, [name]. [name], DO [facility] Electronically Signed   By: [name] D.O.   On: 12/07/2015 11:53   Dg Lumbar Spine Complete  12/07/2015  CLINICAL DATA:  Fall on concrete 4 days ago, upper thoracic and lumbar back pain, status post right nephrectomy EXAM: LUMBAR SPINE - COMPLETE 4+ VIEW COMPARISON:  11/12/2015 FINDINGS: Five views of lumbar spine submitted. No acute fracture or subluxation. Mild levoscoliosis. Mild disc space flattening at L4-L5 level. Mild anterior spurring upper endplate of L4 and L5 vertebral body. There is disc space flattening with endplate sclerotic changes mild anterior spurring at L5-S1 level. IMPRESSION: No acute fracture or subluxation. Mild levoscoliosis. Mild disc space flattening at L4-L5 level. Moderate disc space flattening with endplate sclerotic changes and mild anterior spurring at L5-S1 level. Electronically Signed   By: [name] M.D.   On: 12/07/2015 12:08   Ct Chest W Contrast  12/07/2015  CLINICAL DATA:  Right back pain. Chest pain.  Difficulty breathing. Cyst drained from buttocks 5 weeks ago. Diarrhea. History of renal cell carcinoma. EXAM: CT CHEST, ABDOMEN, AND PELVIS WITH CONTRAST TECHNIQUE: Multidetector CT imaging of the chest, abdomen and pelvis was performed following the standard protocol during bolus administration of intravenous contrast. CONTRAST:  162mL OMNIPAQUE IOHEXOL 300 MG/ML  SOLN COMPARISON:  11/12/2015.  09/13/2015. FINDINGS: CT CHEST Tiny hypodensity in the left lobe of the thyroid gland. No abnormal mediastinal adenopathy.  No pericardial effusion. No pneumothorax or pleural effusion Focal thickening of the right minor fissure is not significantly changed. See image 36 of series 4 on coronal reformats. Scarring in the central left lower lobe extending to the left base is stable. 3 mm left upper lobe nodule on image 21 is stable. Pleural nodules in the posterior left hemi thorax on image 23 are stable. Reticulonodular opacities have developed at the posterior left lung base. No acute bony deformity. Chronic changes of the left sixth rib are noted. CT ABDOMEN AND PELVIS Hypodensity in the left lobe of the liver measuring 8 mm on image 50 is stable. Diffuse hepatic steatosis Gallbladder is unremarkable. Adrenal glands are within normal limits. There is some nodularity without focal mass on  the left. Left renal mass on image 66 in the upper pole measures 4.8 cm. It is not significantly changed. Benign cyst in the upper pole of the left kidney is noted. Status post right nephrectomy. There is an ill-defined area of low density within the body of the pancreas on image 63 measuring 1.8 cm worrisome for adenocarcinoma. Spleen is upper normal in size. Appendix is within normal limits. There is diverticulosis of the descending and sigmoid colon. There is wall thickening of the sigmoid colon. There is stranding in the adjacent fat. There is also stranding in the perirectal fat. A single air bubble is seen in the anterior right  ischiorectal fossae on image 122. This is in the location of the patient's previously seen abscess. It is extraluminal. There is thickening of the wall of the distal rectum adjacent to this gas. No fluid filled abscess is identified. There is stranding in extending from this area inferiorly towards the skin of the inner right buttock. Overall, the previously described abscess has nearly resolved. Bladder is decompressed.  Prostate is unremarkable. No evidence of retroperitoneal adenopathy. IMPRESSION: There are reticulonodular opacities at the posterior left lung base worrisome for an inflammatory process. The abscess in the right ischiorectal fossa adjacent to the rectum has markedly improved. There is a small extraluminal gas bubble in the location of the previously seen abscess which is residual. There is also residual wall thickening of the adjacent rectum. There is stranding in the perirectal fat. There is mild inflammatory change of the sigmoid colon. No fluid filled abscess 1.8 cm mass in the body of the pancreas is worrisome for adenocarcinoma metastatic disease. Stable hypodensity in the left lobe of the liver this supports benign etiology. Stable left renal mass. Electronically Signed   By: Marybelle Killings M.D.   On: 12/07/2015 18:05   Ct Abdomen Pelvis W Contrast  12/07/2015  CLINICAL DATA:  Right back pain. Chest pain. Difficulty breathing. Cyst drained from buttocks 5 weeks ago. Diarrhea. History of renal cell carcinoma. EXAM: CT CHEST, ABDOMEN, AND PELVIS WITH CONTRAST TECHNIQUE: Multidetector CT imaging of the chest, abdomen and pelvis was performed following the standard protocol during bolus administration of intravenous contrast. CONTRAST:  173mL OMNIPAQUE IOHEXOL 300 MG/ML  SOLN COMPARISON:  11/12/2015.  09/13/2015. FINDINGS: CT CHEST Tiny hypodensity in the left lobe of the thyroid gland. No abnormal mediastinal adenopathy.  No pericardial effusion. No pneumothorax or pleural effusion Focal  thickening of the right minor fissure is not significantly changed. See image 36 of series 4 on coronal reformats. Scarring in the central left lower lobe extending to the left base is stable. 3 mm left upper lobe nodule on image 21 is stable. Pleural nodules in the posterior left hemi thorax on image 23 are stable. Reticulonodular opacities have developed at the posterior left lung base. No acute bony deformity. Chronic changes of the left sixth rib are noted. CT ABDOMEN AND PELVIS Hypodensity in the left lobe of the liver measuring 8 mm on image 50 is stable. Diffuse hepatic steatosis Gallbladder is unremarkable. Adrenal glands are within normal limits. There is some nodularity without focal mass on the left. Left renal mass on image 66 in the upper pole measures 4.8 cm. It is not significantly changed. Benign cyst in the upper pole of the left kidney is noted. Status post right nephrectomy. There is an ill-defined area of low density within the body of the pancreas on image 63 measuring 1.8 cm worrisome for adenocarcinoma. Spleen is upper  normal in size. Appendix is within normal limits. There is diverticulosis of the descending and sigmoid colon. There is wall thickening of the sigmoid colon. There is stranding in the adjacent fat. There is also stranding in the perirectal fat. A single air bubble is seen in the anterior right ischiorectal fossae on image 122. This is in the location of the patient's previously seen abscess. It is extraluminal. There is thickening of the wall of the distal rectum adjacent to this gas. No fluid filled abscess is identified. There is stranding in extending from this area inferiorly towards the skin of the inner right buttock. Overall, the previously described abscess has nearly resolved. Bladder is decompressed.  Prostate is unremarkable. No evidence of retroperitoneal adenopathy. IMPRESSION: There are reticulonodular opacities at the posterior left lung base worrisome for an  inflammatory process. The abscess in the right ischiorectal fossa adjacent to the rectum has markedly improved. There is a small extraluminal gas bubble in the location of the previously seen abscess which is residual. There is also residual wall thickening of the adjacent rectum. There is stranding in the perirectal fat. There is mild inflammatory change of the sigmoid colon. No fluid filled abscess 1.8 cm mass in the body of the pancreas is worrisome for adenocarcinoma metastatic disease. Stable hypodensity in the left lobe of the liver this supports benign etiology. Stable left renal mass. Electronically Signed   By: Marybelle Killings M.D.   On: 12/07/2015 18:05    Scheduled Meds: . sodium chloride   Intravenous STAT  . methylPREDNISolone (SOLU-MEDROL) injection  60 mg Intravenous Once  . piperacillin-tazobactam (ZOSYN)  IV  3.375 g Intravenous Q8H  . [START ON 12/09/2015] predniSONE  40 mg Oral QAC breakfast  . sodium chloride flush  3 mL Intravenous Q12H   Continuous Infusions:   Principal Problem:   Fever Active Problems:   Pancreatic mass   Renal cell cancer (HCC)   DJD (degenerative joint disease)   ETOH abuse   Anemia of chronic disease   Acute gout   Chest wall pain   Fall   Gouty arthritis    Time spent: 74 minutes    Delisia Mcquiston M.D. [facility] Pager [phone]. If 7PM-7AM, please contact night-coverage at [email or web address], password [email or web address] 12/08/2015, 9:45 AM

## 2015-12-08 NOTE — Progress Notes (Addendum)
Initial Nutrition Assessment  DOCUMENTATION CODES:   Severe malnutrition in context of acute illness/injury  INTERVENTION:  Heart Healthy diet- nutrition services staff to obtain daily meal preferences.  Provide Ensure Enlive po BID, each supplement provides 350 kcal and 20 grams of protein. Pt prefers vanilla or strawberry flavor.  Recommend PT evaluate pt functional status  NUTRITION DIAGNOSIS:   Increased nutrient needs related to altered GI function as evidenced by per patient/family report, moderate depletions of muscle mass, mild depletion of body fat, percent weight loss.   GOAL:   Patient will meet greater than or equal to 90% of their needs   MONITOR:   PO intake, Supplement acceptance, Labs, Weight trends  REASON FOR ASSESSMENT:   Malnutrition Screening Tool    ASSESSMENT:   65 yo male h/o gout, chronic back pain, RCC on oral chemo, recent hospitalization for perirectal absess and cdiff comes in with several days of "pain all over" but mostly when you touch him, and in his bilateral wrists. Pt says this feels like his gout flares. He denies any cough or uri symptoms. He says his diarrhea is much improved from 3 weeks ago (completed course of flagyl for cdiff). Denies any pleuritic chest pain. His wrists are painful and swollen. Did not know he was running fever but has temp in the ED of 101. So sob, pt reports his rectal area is healing well after being I&d by dr Arnoldo Morale last month.  Pt says , " I've not been eating good". He lives with his wife and says they eat take out most meals: pizza, [employer] etc. His breakfasts vary widely from cereal to fried bologna or eggs. They usually try to choose "something quick". His favorite beverage is coke but pt says he drinks mostly sprite due to his [facility] recommendations.  Pt goes on the say he has spent most of his time in the recliner. Complains that he has gotten increasingly weaker and is using a walker to get around  the house. He is able go with wife to shop but says he stays in the car.  Nutrition focused exam completed: Mild fat mass depletion, Moderate muscle wasting, mild edema.   Abnormal labs: hyponatremia, cr. 1.71, GFR-41. His uric acid 8.1 elevated- pt has hx of gout  Diet Order:  Diet Heart Room service appropriate?: Yes; Fluid consistency:: Thin  Skin:   dry intact  Last BM:   12/08/15 loose watery stools   Height:   Ht Readings from Last 1 Encounters:  12/07/15 5\' 11"  (1.803 m)    Weight:   Wt Readings from Last 1 Encounters:  12/07/15 161 lb 9.6 oz (73.3 kg)    Ideal Body Weight:  78 kg  BMI:  Body mass index is 22.55 kg/(m^2).  Estimated Nutritional Needs:   Kcal:  2000-2200   Protein:  85-95 gr  Fluid:  >2.0 liters daily  EDUCATION NEEDS:   Education needs no appropriate at this time  Colman Cater MS,RD,CSG,LDN Office: [phone] Pager: [phone]

## 2015-12-09 ENCOUNTER — Telehealth: Payer: Self-pay | Admitting: Oncology

## 2015-12-09 ENCOUNTER — Other Ambulatory Visit: Payer: Self-pay | Admitting: Oncology

## 2015-12-09 DIAGNOSIS — R509 Fever, unspecified: Secondary | ICD-10-CM

## 2015-12-09 DIAGNOSIS — M1009 Idiopathic gout, multiple sites: Secondary | ICD-10-CM

## 2015-12-09 DIAGNOSIS — D638 Anemia in other chronic diseases classified elsewhere: Secondary | ICD-10-CM

## 2015-12-09 DIAGNOSIS — K869 Disease of pancreas, unspecified: Secondary | ICD-10-CM | POA: Diagnosis not present

## 2015-12-09 LAB — CBC
HCT: 22.6 % — ABNORMAL LOW (ref 39.0–52.0)
Hemoglobin: 7.6 g/dL — ABNORMAL LOW (ref 13.0–17.0)
MCH: 32.5 pg (ref 26.0–34.0)
MCHC: 33.6 g/dL (ref 30.0–36.0)
MCV: 96.6 fL (ref 78.0–100.0)
PLATELETS: 141 10*3/uL — AB (ref 150–400)
RBC: 2.34 MIL/uL — AB (ref 4.22–5.81)
RDW: 16.6 % — AB (ref 11.5–15.5)
WBC: 3.8 10*3/uL — AB (ref 4.0–10.5)

## 2015-12-09 LAB — CANCER ANTIGEN 19-9: CA 19 9: 10 U/mL (ref 0–35)

## 2015-12-09 LAB — BASIC METABOLIC PANEL
Anion gap: 11 (ref 5–15)
BUN: 28 mg/dL — AB (ref 6–20)
CALCIUM: 8.9 mg/dL (ref 8.9–10.3)
CHLORIDE: 104 mmol/L (ref 101–111)
CO2: 21 mmol/L — ABNORMAL LOW (ref 22–32)
CREATININE: 1.67 mg/dL — AB (ref 0.61–1.24)
GFR, EST AFRICAN AMERICAN: 48 mL/min — AB (ref >60)
GFR, EST NON AFRICAN AMERICAN: 42 mL/min — AB (ref >60)
Glucose, Bld: 150 mg/dL — ABNORMAL HIGH (ref 65–99)
Potassium: 4.9 mmol/L (ref 3.5–5.1)
SODIUM: 136 mmol/L (ref 135–145)

## 2015-12-09 LAB — FOLATE: FOLATE: 10.3 ng/mL (ref >5.9)

## 2015-12-09 LAB — IRON AND TIBC
IRON: 21 ug/dL — AB (ref 45–182)
Saturation Ratios: 12 % — ABNORMAL LOW (ref 17.9–39.5)
TIBC: 172 ug/dL — AB (ref 250–450)
UIBC: 151 ug/dL

## 2015-12-09 LAB — FERRITIN: FERRITIN: 775 ng/mL — AB (ref 24–336)

## 2015-12-09 LAB — VITAMIN B12: VITAMIN B 12: 410 pg/mL (ref 180–914)

## 2015-12-09 MED ORDER — PREDNISONE 20 MG PO TABS: 40.0000 mg | ORAL_TABLET | Freq: Every day | ORAL | Status: DC

## 2015-12-09 MED ORDER — LOPERAMIDE HCL 2 MG PO CAPS: 2.0000 mg | ORAL_CAPSULE | ORAL | Status: DC | PRN

## 2015-12-09 MED ORDER — OXYCODONE-ACETAMINOPHEN 5-325 MG PO TABS: 1.0000 | ORAL_TABLET | Freq: Four times a day (QID) | ORAL | Status: DC | PRN

## 2015-12-09 MED ORDER — ENSURE ENLIVE PO LIQD: 237.0000 mL | Freq: Two times a day (BID) | ORAL | Status: DC

## 2015-12-09 NOTE — Discharge Summary (Signed)
Physician Discharge Summary  Jeffrey Trujillo [id number] DOB: 06-17-1951 DOA: 12/07/2015  PCP: [name], MD  Admit date: 12/07/2015 Discharge date: 12/09/2015  Time spent: 65 minutes  Recommendations for Outpatient Follow-up:  1. Follow-up with [name] as scheduled for follow-up on pancreatic mass seen on CT of the abdomen and pelvis. Patient will need BMET and CBC done on follow up. 2. Follow-up at [facility] in 2 weeks for follow-up on his acute gouty flare. Once acute episode has resolved patient may benefit from being started on allopurinol and colchicine.   Discharge Diagnoses:  Principal Problem:   Acute gout Active Problems:   Gouty arthritis   Pancreatic mass   Renal cell cancer (HCC)   DJD (degenerative joint disease)   ETOH abuse   Anemia of chronic disease   Fever   Chest wall pain   Fall   Discharge Condition: Stable and improved  Diet recommendation: Regular  Filed Weights   12/07/15 0904 12/07/15 2202  Weight: 77.111 kg (170 lb) 73.3 kg (161 lb 9.6 oz)    History of present illness:  Per Dr Shanon Brow 65 yo male h/o gout, chronic back pain, RCC on oral chemo, recent hospitalization for perirectal absess and c diff comes in with several days of "pain all over" but mostly when you touch him, and in his bilateral wrists. Pt says this felt like his gout flares. He denied any cough or uri symptoms. He stated his diarrhea was much improved from 3 weeks ago (completed course of flagyl for cdiff). Denied any pleuritic chest pain. His wrists are painful and swollen. Did not know he was running fever but has temp in the ED of 101. No sob, pt reported his rectal area is healing well after being I&d by dr Arnoldo Morale last month. Pt had ct chest which shows possible pna. Flu swab was pending. Pt referred for admission for his fever.   Hospital Course:  1 acute gouty flare Patient presented with fever, bilateral wrist pain, right elbow pain,  right knee pain. Uric acid level is elevated. Clinical findings consistent with acute gouty flare. Patient was initially placed on indomethacin however due to patient's chronic kidney disease and history of renal cell carcinoma status post nephrectomy, indomethacin was discontinued. Patient was given a dose of IV Solu-Medrol and subsequently placed on oral prednisone with significant improvement with his symptoms. Patient be discharged home on 5 more days of oral prednisone to complete a short course of treatment. After patient's acute gouty flare has resolved he'll need to follow-up with PCP may likely benefit from allopurinol and colchicine. Outpatient follow-up.  #2 fever Likely secondary to problem #1. There was concern for possible pneumonia, however patient with no respiratory symptoms. CT of the chest showing recticulonodular opacities in the posterior left lung base concerning for possible inflammatory process. Patient had initially been placed on empiric IV antibiotics of vancomycin and Zosyn which was subsequently discontinued. Patient remained afebrile for the rest of the hospitalization.  #3 history of alcohol abuse Currently stable. UDS negative. Follow.  #4 pancreatic mass Noted on CT abdomen and pelvis. Patient with prior history of renal cell carcinoma with metastases and a prior history of a pancreatic lesion which had seemed to have resolved the last CT of December 2016. Due to patient's history of renal cell carcinoma and prior history of pancreatic lesion, GI was consulted. It was recommended by gastroenterology that patient needed to follow-up with his oncologist Dr Alen Blew as soon as possible in  the outpatient setting to determine the next best management/evaluation. I spoke with Dr.Shadad on day of discharge, we will review the films and noted that patient did have a prior history of pancreatic lesion. An appointment will be set up for patient to follow-up with Dr Alen Blew in the  outpatient setting. CA-19-9 which was obtained was within normal limits at 10.  #5 Anemia Patient noted to be anemic likely anemia of chronic disease. Patient's hemoglobin remained stable. Patient did not have any bleeding. Patient be discharged in stable condition outpatient follow-up.  #6 Chronic pain Continued on home pain regimen.  #7 RCC/chronic kidney disease stage III with history of nephrectomy. Patient's renal function remained stable during the hospitalization. Outpatient follow up with Dr Alen Blew.  Procedures:  CT chest 12/07/2015  CT abdomen and pelvis 12/07/2015    Consultations: Gastroenterology: Dr. Sydell Axon 12/08/2015  Discharge Exam: Filed Vitals:   12/08/15 1439 12/09/15 0741  BP: 154/80 114/72  Pulse: 44 59  Temp: 97.5 F (36.4 C) 97.7 F (36.5 C)  Resp: 20 20    General: NAD Cardiovascular: RRR Respiratory: CTAB  Discharge Instructions   Discharge Instructions    Diet general    Complete by:  As directed      Discharge instructions    Complete by:  As directed   Follow up with [facility], MD as scheduled.     Increase activity slowly    Complete by:  As directed           Current Discharge Medication List    START taking these medications   Details  feeding supplement, ENSURE ENLIVE, (ENSURE ENLIVE) LIQD Take 237 mLs by mouth 2 (two) times daily between meals. Qty: 237 mL, Refills: 0    loperamide (IMODIUM) 2 MG capsule Take 1 capsule (2 mg total) by mouth as needed for diarrhea or loose stools. Qty: 30 capsule, Refills: 0    predniSONE (DELTASONE) 20 MG tablet Take 2 tablets (40 mg total) by mouth daily before breakfast. Take for 5 days, then stop. Qty: 10 tablet, Refills: 0      CONTINUE these medications which have CHANGED   Details  oxyCODONE-acetaminophen (PERCOCET/ROXICET) 5-325 MG tablet Take 1-2 tablets by mouth every 6 (six) hours as needed for severe pain. Qty: 14 tablet, Refills: 0      CONTINUE these medications  which have NOT CHANGED   Details  diphenhydrAMINE (BENADRYL) 25 MG tablet Take 25 mg by mouth daily as needed for itching.    pantoprazole (PROTONIX) 40 MG tablet TAKE (1) TABLET BY MOUTH TWICE A DAY BEFORE MEALS. (BREAKFAST AND SUPPER) Qty: 60 tablet, Refills: 5    pazopanib (VOTRIENT) 200 MG tablet Take 2 tablets by mouth, daily on an empty stomach Qty: 60 tablet, Refills: 0   Associated Diagnoses: Renal cell cancer, unspecified laterality (HCC)    topiramate (TOPAMAX) 25 MG tablet Take 25 mg by mouth 2 (two) times daily.   Associated Diagnoses: Renal cell cancer, unspecified laterality ([place])      STOP taking these medications     indomethacin (INDOCIN) 50 MG capsule      metroNIDAZOLE (FLAGYL) 500 MG tablet      zolpidem (AMBIEN) 5 MG tablet        Allergies  Allergen Reactions  . Amoxicillin Nausea And Vomiting  . Nsaids Other (See Comments)    Patient only has one kidney.   . Demerol [Meperidine] Rash  . Meperidine Hcl Hives and Rash   Follow-up Information  Schedule an appointment as soon as possible for a visit with [facility], MD.   Specialty:  Oncology   Why:  office will call with appointment time.   Contact information:   [address] [place] [address] [phone]       Follow up with [facility]. Schedule an appointment as soon as possible for a visit in 2 weeks.   Contact information:   [address] [place] [address] [phone]        The results of significant diagnostics from this hospitalization (including imaging, microbiology, ancillary and laboratory) are listed below for reference.    Significant Diagnostic Studies: Ct Abdomen Pelvis Wo Contrast  11/12/2015  CLINICAL DATA:  65 year old male with abdominal and back pain and rectal pain and fullness. EXAM: CT ABDOMEN AND PELVIS WITHOUT CONTRAST TECHNIQUE: Multidetector CT imaging of the abdomen and pelvis was performed following the standard  protocol without IV contrast. COMPARISON:  09/13/2015 and prior CTs FINDINGS: Please note that parenchymal abnormalities may be missed without intravenous contrast. Lower chest:  A trace left pleural effusion is noted. Hepatobiliary: The liver and gallbladder are unremarkable. There is no evidence of biliary dilatation. Pancreas: Unremarkable Spleen: Upper limits of normal sized Adrenals/Urinary Tract: A mass within the upper left kidney is grossly unchanged. An upper pole cyst is again noted. Patient is status post right nephrectomy. The adrenal glands and bladder are unchanged. Stomach/Bowel: A 4 x 2.3 x 10.5 cm (AP x transverse x CC) right perirectal abscess is identified and extends from the of the mid-distal rectum to the inner right gluteal region. Focal right rectal wall thickening is identified (image 85). There is no evidence of bowel obstruction, pneumoperitoneum or other definite bowel wall thickening. Vascular/Lymphatic: No evidence of abdominal aortic aneurysm or enlarged lymph nodes. Reproductive: Mild prostate enlargement noted. Other: No free fluid. Musculoskeletal: No acute abnormalities identified. IMPRESSION: 4 x 2.8 x 10.5 cm right perirectal abscess with focal right rectal wall thickening. Grossly unchanged left upper renal mass. Trace left pleural effusion. Electronically Signed   By: Margarette Canada M.D.   On: 11/12/2015 10:52   Dg Chest 2 View  12/07/2015  CLINICAL DATA:  64 year old male with a history of sudden onset severe left chest pain EXAM: CHEST - 2 VIEW COMPARISON:  11/12/2015, 09/13/2015 FINDINGS: Cardiomediastinal silhouette unchanged in size and contour. Left-sided postoperative/post traumatic rib deformities are unchanged. Architectural distortion in the bilateral mid and lower lungs is unchanged. Blunting of the left costophrenic angle persists. No confluent airspace disease or pneumothorax.  No pleural effusion. Unremarkable appearance of the upper abdomen. IMPRESSION: Chronic  lung changes without evidence of superimposed acute cardiopulmonary disease. Similar appearance of posttraumatic/ postsurgical deformity of the left chest wall. Signed, [name]. [name], DO [facility] Electronically Signed   By: [name] D.O.   On: 12/07/2015 11:53   Dg Chest 2 View  11/12/2015  CLINICAL DATA:  Rectal cancer, unspecified laterality.  Back pain. EXAM: CHEST  2 VIEW COMPARISON:  01/03/2015 FINDINGS: Chronic blunting at the left costophrenic angle. Old deformity of the left sixth rib. Lungs are clear without airspace disease or pulmonary edema. Heart and mediastinum are stable and within normal limits. No pleural effusions. IMPRESSION: No active cardiopulmonary disease.- Electronically Signed   By: Markus Daft M.D.   On: 11/12/2015 14:20   Dg Lumbar Spine Complete  12/07/2015  CLINICAL DATA:  Fall on concrete 4 days ago, upper thoracic  and lumbar back pain, status post right nephrectomy EXAM: LUMBAR SPINE - COMPLETE 4+ VIEW COMPARISON:  11/12/2015 FINDINGS: Five views of lumbar spine submitted. No acute fracture or subluxation. Mild levoscoliosis. Mild disc space flattening at L4-L5 level. Mild anterior spurring upper endplate of L4 and L5 vertebral body. There is disc space flattening with endplate sclerotic changes mild anterior spurring at L5-S1 level. IMPRESSION: No acute fracture or subluxation. Mild levoscoliosis. Mild disc space flattening at L4-L5 level. Moderate disc space flattening with endplate sclerotic changes and mild anterior spurring at L5-S1 level. Electronically Signed   By: [name] M.D.   On: 12/07/2015 12:08   Ct Chest W Contrast  12/07/2015  CLINICAL DATA:  Right back pain. Chest pain. Difficulty breathing. Cyst drained from buttocks 5 weeks ago. Diarrhea. History of renal cell carcinoma. EXAM: CT CHEST, ABDOMEN, AND PELVIS WITH CONTRAST TECHNIQUE: Multidetector CT imaging of the chest, abdomen and pelvis  was performed following the standard protocol during bolus administration of intravenous contrast. CONTRAST:  192mL OMNIPAQUE IOHEXOL 300 MG/ML  SOLN COMPARISON:  11/12/2015.  09/13/2015. FINDINGS: CT CHEST Tiny hypodensity in the left lobe of the thyroid gland. No abnormal mediastinal adenopathy.  No pericardial effusion. No pneumothorax or pleural effusion Focal thickening of the right minor fissure is not significantly changed. See image 36 of series 4 on coronal reformats. Scarring in the central left lower lobe extending to the left base is stable. 3 mm left upper lobe nodule on image 21 is stable. Pleural nodules in the posterior left hemi thorax on image 23 are stable. Reticulonodular opacities have developed at the posterior left lung base. No acute bony deformity. Chronic changes of the left sixth rib are noted. CT ABDOMEN AND PELVIS Hypodensity in the left lobe of the liver measuring 8 mm on image 50 is stable. Diffuse hepatic steatosis Gallbladder is unremarkable. Adrenal glands are within normal limits. There is some nodularity without focal mass on the left. Left renal mass on image 66 in the upper pole measures 4.8 cm. It is not significantly changed. Benign cyst in the upper pole of the left kidney is noted. Status post right nephrectomy. There is an ill-defined area of low density within the body of the pancreas on image 63 measuring 1.8 cm worrisome for adenocarcinoma. Spleen is upper normal in size. Appendix is within normal limits. There is diverticulosis of the descending and sigmoid colon. There is wall thickening of the sigmoid colon. There is stranding in the adjacent fat. There is also stranding in the perirectal fat. A single air bubble is seen in the anterior right ischiorectal fossae on image 122. This is in the location of the patient's previously seen abscess. It is extraluminal. There is thickening of the wall of the distal rectum adjacent to this gas. No fluid filled abscess is  identified. There is stranding in extending from this area inferiorly towards the skin of the inner right buttock. Overall, the previously described abscess has nearly resolved. Bladder is decompressed.  Prostate is unremarkable. No evidence of retroperitoneal adenopathy. IMPRESSION: There are reticulonodular opacities at the posterior left lung base worrisome for an inflammatory process. The abscess in the right ischiorectal fossa adjacent to the rectum has markedly improved. There is a small extraluminal gas bubble in the location of the previously seen abscess which is residual. There is also residual wall thickening of the adjacent rectum. There is stranding in the perirectal fat. There is mild inflammatory change of the sigmoid colon. No fluid filled abscess  1.8 cm mass in the body of the pancreas is worrisome for adenocarcinoma metastatic disease. Stable hypodensity in the left lobe of the liver this supports benign etiology. Stable left renal mass. Electronically Signed   By: Marybelle Killings M.D.   On: 12/07/2015 18:05   Ct Abdomen Pelvis W Contrast  12/07/2015  CLINICAL DATA:  Right back pain. Chest pain. Difficulty breathing. Cyst drained from buttocks 5 weeks ago. Diarrhea. History of renal cell carcinoma. EXAM: CT CHEST, ABDOMEN, AND PELVIS WITH CONTRAST TECHNIQUE: Multidetector CT imaging of the chest, abdomen and pelvis was performed following the standard protocol during bolus administration of intravenous contrast. CONTRAST:  174mL OMNIPAQUE IOHEXOL 300 MG/ML  SOLN COMPARISON:  11/12/2015.  09/13/2015. FINDINGS: CT CHEST Tiny hypodensity in the left lobe of the thyroid gland. No abnormal mediastinal adenopathy.  No pericardial effusion. No pneumothorax or pleural effusion Focal thickening of the right minor fissure is not significantly changed. See image 36 of series 4 on coronal reformats. Scarring in the central left lower lobe extending to the left base is stable. 3 mm left upper lobe nodule on  image 21 is stable. Pleural nodules in the posterior left hemi thorax on image 23 are stable. Reticulonodular opacities have developed at the posterior left lung base. No acute bony deformity. Chronic changes of the left sixth rib are noted. CT ABDOMEN AND PELVIS Hypodensity in the left lobe of the liver measuring 8 mm on image 50 is stable. Diffuse hepatic steatosis Gallbladder is unremarkable. Adrenal glands are within normal limits. There is some nodularity without focal mass on the left. Left renal mass on image 66 in the upper pole measures 4.8 cm. It is not significantly changed. Benign cyst in the upper pole of the left kidney is noted. Status post right nephrectomy. There is an ill-defined area of low density within the body of the pancreas on image 63 measuring 1.8 cm worrisome for adenocarcinoma. Spleen is upper normal in size. Appendix is within normal limits. There is diverticulosis of the descending and sigmoid colon. There is wall thickening of the sigmoid colon. There is stranding in the adjacent fat. There is also stranding in the perirectal fat. A single air bubble is seen in the anterior right ischiorectal fossae on image 122. This is in the location of the patient's previously seen abscess. It is extraluminal. There is thickening of the wall of the distal rectum adjacent to this gas. No fluid filled abscess is identified. There is stranding in extending from this area inferiorly towards the skin of the inner right buttock. Overall, the previously described abscess has nearly resolved. Bladder is decompressed.  Prostate is unremarkable. No evidence of retroperitoneal adenopathy. IMPRESSION: There are reticulonodular opacities at the posterior left lung base worrisome for an inflammatory process. The abscess in the right ischiorectal fossa adjacent to the rectum has markedly improved. There is a small extraluminal gas bubble in the location of the previously seen abscess which is residual. There is  also residual wall thickening of the adjacent rectum. There is stranding in the perirectal fat. There is mild inflammatory change of the sigmoid colon. No fluid filled abscess 1.8 cm mass in the body of the pancreas is worrisome for adenocarcinoma metastatic disease. Stable hypodensity in the left lobe of the liver this supports benign etiology. Stable left renal mass. Electronically Signed   By: Marybelle Killings M.D.   On: 12/07/2015 18:05   Dg Chest Port 1 View  11/13/2015  CLINICAL DATA:  PICC placement. EXAM:  PORTABLE CHEST 1 VIEW COMPARISON:  Earlier this day at 1402 hour FINDINGS: Right-sided central line, likely from an internal jugular approach, tip in the mid SVC. No pneumothorax. The cardiomediastinal contours are normal. Chronic blunting of left costophrenic angle. Minimal right basilar atelectasis. Pulmonary vasculature is normal. No consolidation, pleural effusion, or pneumothorax. No acute osseous abnormalities are seen. Left posterior rib deformity again seen. IMPRESSION: Tip of the right central line in the mid SVC.  No pneumothorax. Electronically Signed   By: Jeb Levering M.D.   On: 11/13/2015 01:05    Microbiology: Recent Results (from the past 240 hour(s))  Culture, blood (routine x 2) Call MD if unable to obtain prior to antibiotics being given     Status: None (Preliminary result)   Collection Time: 12/07/15 10:35 PM  Result Value Ref Range Status   Specimen Description BLOOD RIGHT ANTECUBITAL  Final   Special Requests   Final    BOTTLES DRAWN AEROBIC AND ANAEROBIC AEB 4CC ANA 2CC   Culture NO GROWTH < 24 HOURS  Final   Report Status PENDING  Incomplete  C difficile quick scan w PCR reflex     Status: None   Collection Time: 12/08/15 10:30 AM  Result Value Ref Range Status   C Diff antigen NEGATIVE NEGATIVE Final   C Diff toxin NEGATIVE NEGATIVE Final   C Diff interpretation Negative for toxigenic C. difficile  Final     Labs: Basic Metabolic Panel:  Recent Labs Lab  12/07/15 1050 12/08/15 0946 12/09/15 0531  NA 134* 132* 136  K 4.2 4.2 4.9  CL 102 100* 104  CO2 23 25 21*  GLUCOSE 127* 119* 150*  BUN 14 17 28*  CREATININE 1.39* 1.71* 1.67*  CALCIUM 8.9 8.1* 8.9   Liver Function Tests:  Recent Labs Lab 12/07/15 1050  AST 21  ALT 15*  ALKPHOS 69  BILITOT 1.2  PROT 7.3  ALBUMIN 3.3*    Recent Labs Lab 12/07/15 1050  LIPASE 26   No results for input(s): AMMONIA in the last 168 hours. CBC:  Recent Labs Lab 12/07/15 1050 12/08/15 0946 12/09/15 0531  WBC 4.8 4.1 3.8*  NEUTROABS 3.6  --   --   HGB 9.3* 7.7* 7.6*  HCT 27.5* 23.1* 22.6*  MCV 99.6 97.9 96.6  PLT 167 115* 141*   Cardiac Enzymes: No results for input(s): CKTOTAL, CKMB, CKMBINDEX, TROPONINI in the last 168 hours. BNP: BNP (last 3 results)  Recent Labs  01/03/15 1043  BNP 57.0    ProBNP (last 3 results) No results for input(s): PROBNP in the last 8760 hours.  CBG: No results for input(s): GLUCAP in the last 168 hours.     SignedIrine Seal MD.  [facility] 12/09/2015, 12:12 PM

## 2015-12-09 NOTE — Progress Notes (Signed)
Subjective: Feels ok today, "could be better, could be worse." Is asking about the spot on his pancreas, explain that it appears to be new and the priority is to get him back with Oncology for further evaluation, states he was due for a follow-up visit with them yesterday. Denies abdominal pain, N/V. Is tolerating diet well. Diarrhea has stopped. No further GI complaints.  Objective: Vital signs in last 24 hours: Temp:  [97.5 F (36.4 C)-97.7 F (36.5 C)] 97.7 F (36.5 C) (03/24 0741) Pulse Rate:  [44-59] 59 (03/24 0741) Resp:  [20] 20 (03/24 0741) BP: (114-154)/(72-80) 114/72 mmHg (03/24 0741) SpO2:  [98 %-100 %] 98 % (03/24 0741) Last BM Date: 12/09/15 General:   Alert and oriented, pleasant sitting at the edge of the bed. Head:  Normocephalic and atraumatic. Eyes:  No icterus, sclera clear. Conjuctiva pink.  Heart:  S1, S2 present, no murmurs noted.  Lungs: Clear to auscultation bilaterally, without wheezing, rales, or rhonchi.  Abdomen:  Bowel sounds present, soft, non-tender, non-distended. No HSM or hernias noted. No rebound or guarding. Msk:  Symmetrical without gross deformities. Extremities:  Without clubbing or edema. Neurologic:  Alert and  oriented x4;  grossly normal neurologically. Skin:  Warm and dry, intact without significant lesions.  Cervical Nodes:  No significant cervical adenopathy. Psych:  Alert and cooperative. Normal mood and affect.  Intake/Output from previous day: 03/23 0701 - 03/24 0700 In: -  Out: 850 [Urine:850] Intake/Output this shift:    Lab Results:  Recent Labs  12/07/15 1050 12/08/15 0946 12/09/15 0531  WBC 4.8 4.1 3.8*  HGB 9.3* 7.7* 7.6*  HCT 27.5* 23.1* 22.6*  PLT 167 115* 141*   BMET  Recent Labs  12/07/15 1050 12/08/15 0946 12/09/15 0531  NA 134* 132* 136  K 4.2 4.2 4.9  CL 102 100* 104  CO2 23 25 21*  GLUCOSE 127* 119* 150*  BUN 14 17 28*  CREATININE 1.39* 1.71* 1.67*  CALCIUM 8.9 8.1* 8.9   LFT  Recent  Labs  12/07/15 1050  PROT 7.3  ALBUMIN 3.3*  AST 21  ALT 15*  ALKPHOS 69  BILITOT 1.2   PT/INR No results for input(s): LABPROT, INR in the last 72 hours. Hepatitis Panel No results for input(s): HEPBSAG, HCVAB, HEPAIGM, HEPBIGM in the last 72 hours.   Studies/Results: Dg Chest 2 View  12/07/2015  CLINICAL DATA:  65 year old male with a history of sudden onset severe left chest pain EXAM: CHEST - 2 VIEW COMPARISON:  11/12/2015, 09/13/2015 FINDINGS: Cardiomediastinal silhouette unchanged in size and contour. Left-sided postoperative/post traumatic rib deformities are unchanged. Architectural distortion in the bilateral mid and lower lungs is unchanged. Blunting of the left costophrenic angle persists. No confluent airspace disease or pneumothorax.  No pleural effusion. Unremarkable appearance of the upper abdomen. IMPRESSION: Chronic lung changes without evidence of superimposed acute cardiopulmonary disease. Similar appearance of posttraumatic/ postsurgical deformity of the left chest wall. Signed, [name]. [name], DO [facility] Rad[facility] Electronically Signed   By: [name] D.O.   On: 12/07/2015 11:53   Dg Lumbar Spine Complete  12/07/2015  CLINICAL DATA:  Fall on concrete 4 days ago, upper thoracic and lumbar back pain, status post right nephrectomy EXAM: LUMBAR SPINE - COMPLETE 4+ VIEW COMPARISON:  11/12/2015 FINDINGS: Five views of lumbar spine submitted. No acute fracture or subluxation. Mild levoscoliosis. Mild disc space flattening at L4-L5 level. Mild anterior spurring upper endplate of L4 and L5 vertebral body.  There is disc space flattening with endplate sclerotic changes mild anterior spurring at L5-S1 level. IMPRESSION: No acute fracture or subluxation. Mild levoscoliosis. Mild disc space flattening at L4-L5 level. Moderate disc space flattening with endplate sclerotic changes and mild anterior spurring at L5-S1 level.  Electronically Signed   By: [name] M.D.   On: 12/07/2015 12:08   Ct Chest W Contrast  12/07/2015  CLINICAL DATA:  Right back pain. Chest pain. Difficulty breathing. Cyst drained from buttocks 5 weeks ago. Diarrhea. History of renal cell carcinoma. EXAM: CT CHEST, ABDOMEN, AND PELVIS WITH CONTRAST TECHNIQUE: Multidetector CT imaging of the chest, abdomen and pelvis was performed following the standard protocol during bolus administration of intravenous contrast. CONTRAST:  178m OMNIPAQUE IOHEXOL 300 MG/ML  SOLN COMPARISON:  11/12/2015.  09/13/2015. FINDINGS: CT CHEST Tiny hypodensity in the left lobe of the thyroid gland. No abnormal mediastinal adenopathy.  No pericardial effusion. No pneumothorax or pleural effusion Focal thickening of the right minor fissure is not significantly changed. See image 36 of series 4 on coronal reformats. Scarring in the central left lower lobe extending to the left base is stable. 3 mm left upper lobe nodule on image 21 is stable. Pleural nodules in the posterior left hemi thorax on image 23 are stable. Reticulonodular opacities have developed at the posterior left lung base. No acute bony deformity. Chronic changes of the left sixth rib are noted. CT ABDOMEN AND PELVIS Hypodensity in the left lobe of the liver measuring 8 mm on image 50 is stable. Diffuse hepatic steatosis Gallbladder is unremarkable. Adrenal glands are within normal limits. There is some nodularity without focal mass on the left. Left renal mass on image 66 in the upper pole measures 4.8 cm. It is not significantly changed. Benign cyst in the upper pole of the left kidney is noted. Status post right nephrectomy. There is an ill-defined area of low density within the body of the pancreas on image 63 measuring 1.8 cm worrisome for adenocarcinoma. Spleen is upper normal in size. Appendix is within normal limits. There is diverticulosis of the descending and sigmoid colon. There is wall thickening of the sigmoid  colon. There is stranding in the adjacent fat. There is also stranding in the perirectal fat. A single air bubble is seen in the anterior right ischiorectal fossae on image 122. This is in the location of the patient's previously seen abscess. It is extraluminal. There is thickening of the wall of the distal rectum adjacent to this gas. No fluid filled abscess is identified. There is stranding in extending from this area inferiorly towards the skin of the inner right buttock. Overall, the previously described abscess has nearly resolved. Bladder is decompressed.  Prostate is unremarkable. No evidence of retroperitoneal adenopathy. IMPRESSION: There are reticulonodular opacities at the posterior left lung base worrisome for an inflammatory process. The abscess in the right ischiorectal fossa adjacent to the rectum has markedly improved. There is a small extraluminal gas bubble in the location of the previously seen abscess which is residual. There is also residual wall thickening of the adjacent rectum. There is stranding in the perirectal fat. There is mild inflammatory change of the sigmoid colon. No fluid filled abscess 1.8 cm mass in the body of the pancreas is worrisome for adenocarcinoma metastatic disease. Stable hypodensity in the left lobe of the liver this supports benign etiology. Stable left renal mass. Electronically Signed   By: AMarybelle KillingsM.D.   On: 12/07/2015 18:05   Ct Abdomen  Pelvis W Contrast  12/07/2015  CLINICAL DATA:  Right back pain. Chest pain. Difficulty breathing. Cyst drained from buttocks 5 weeks ago. Diarrhea. History of renal cell carcinoma. EXAM: CT CHEST, ABDOMEN, AND PELVIS WITH CONTRAST TECHNIQUE: Multidetector CT imaging of the chest, abdomen and pelvis was performed following the standard protocol during bolus administration of intravenous contrast. CONTRAST:  16m OMNIPAQUE IOHEXOL 300 MG/ML  SOLN COMPARISON:  11/12/2015.  09/13/2015. FINDINGS: CT CHEST Tiny hypodensity in  the left lobe of the thyroid gland. No abnormal mediastinal adenopathy.  No pericardial effusion. No pneumothorax or pleural effusion Focal thickening of the right minor fissure is not significantly changed. See image 36 of series 4 on coronal reformats. Scarring in the central left lower lobe extending to the left base is stable. 3 mm left upper lobe nodule on image 21 is stable. Pleural nodules in the posterior left hemi thorax on image 23 are stable. Reticulonodular opacities have developed at the posterior left lung base. No acute bony deformity. Chronic changes of the left sixth rib are noted. CT ABDOMEN AND PELVIS Hypodensity in the left lobe of the liver measuring 8 mm on image 50 is stable. Diffuse hepatic steatosis Gallbladder is unremarkable. Adrenal glands are within normal limits. There is some nodularity without focal mass on the left. Left renal mass on image 66 in the upper pole measures 4.8 cm. It is not significantly changed. Benign cyst in the upper pole of the left kidney is noted. Status post right nephrectomy. There is an ill-defined area of low density within the body of the pancreas on image 63 measuring 1.8 cm worrisome for adenocarcinoma. Spleen is upper normal in size. Appendix is within normal limits. There is diverticulosis of the descending and sigmoid colon. There is wall thickening of the sigmoid colon. There is stranding in the adjacent fat. There is also stranding in the perirectal fat. A single air bubble is seen in the anterior right ischiorectal fossae on image 122. This is in the location of the patient's previously seen abscess. It is extraluminal. There is thickening of the wall of the distal rectum adjacent to this gas. No fluid filled abscess is identified. There is stranding in extending from this area inferiorly towards the skin of the inner right buttock. Overall, the previously described abscess has nearly resolved. Bladder is decompressed.  Prostate is unremarkable. No  evidence of retroperitoneal adenopathy. IMPRESSION: There are reticulonodular opacities at the posterior left lung base worrisome for an inflammatory process. The abscess in the right ischiorectal fossa adjacent to the rectum has markedly improved. There is a small extraluminal gas bubble in the location of the previously seen abscess which is residual. There is also residual wall thickening of the adjacent rectum. There is stranding in the perirectal fat. There is mild inflammatory change of the sigmoid colon. No fluid filled abscess 1.8 cm mass in the body of the pancreas is worrisome for adenocarcinoma metastatic disease. Stable hypodensity in the left lobe of the liver this supports benign etiology. Stable left renal mass. Electronically Signed   By: AMarybelle KillingsM.D.   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 Pancreatic lesion has not shown up on the last 2 noncontrast CT scans  in February 2017, December 2016. It did show up on the CT of the abdomen pelvis with contrast back in September 2016 as well as nicely detailed on previous MRIs. Prior EUSs with FNAs most c/w met from renal cell ca but pancreatic adenocarcinoma not entirely excluded per path report. Now with pancreatic body lesion on current CT.   CT reviewed with radiologist and the spot seen on CT this admission appears new.  Clinically improved today, no further N/V, keeping food down well. CDiff negative, Cr improved, anemia: hgb remains low but stable from yesterday and within baseline. No recurrent fever.   Plan: 1. Likely near discharge. 2. Priority is follow-up with oncology for new spot on  pancreas 3. May need outpatient EUS with FNA fot diasgnosis   Walden Field, AGNP-C Adult & Gerontological Nurse Practitioner [facility]      12/09/2015, 8:24 AM

## 2015-12-09 NOTE — Progress Notes (Signed)
Patient discharged home.  IV removed - WNL.  Reviewed DC instructions and medications.  Follow up in place.  No questions at this time  Stable to DC home, assisted off unit via [facility]

## 2015-12-09 NOTE — Evaluation (Signed)
Occupational Therapy Evaluation Patient Details Name: Jeffrey Trujillo MRN: [id number] DOB: 1951-08-15 Today's Date: 12/09/2015    History of Present Illness 65 yo male h/o gout, chronic back pain, RCC on oral chemo, recent hospitalization for perirectal absess and cdiff comes in with several days of "pain all over" but mostly when you touch him, and in his bilateral wrists. Pt says this feels like his gout flares. He denies any cough or uri symptoms. He says his diarrhea is much improved from 3 weeks ago (completed course of flagyl for cdiff). Denies any pleuritic chest pain. His wrists are painful and swollen. Did not know he was running fever but has temp in the ED of 101. So sob, pt reports his rectal area is healing well after being I&d by dr Arnoldo Morale last month. Pt had ct chest which shows possible pna. Flu swab is pending. Pt referred for admission for his fever.   Clinical Impression   Pt awake, alert, eating breakfast this am, agreeable to OT evaluation. Pt reports he is feeling much better today, no pain in the wrists or right elbow. Pt demonstrates BUE ROM WFL, strength WFL 4/5. Pt is mod I for all bed mobility, demonstrates mod independence/supervision during ADL tasks, reports wife assists if needed at home. Pt is using rolling walker for functional mobility tasks. No further OT services required at this time, as pt appears to be at baseline with ADL task completion.     Follow Up Recommendations  No OT follow up;Supervision/Assistance - 24 hour    Equipment Recommendations  Tub/shower bench       Precautions / Restrictions Precautions Precautions: Fall Restrictions Weight Bearing Restrictions: No      Mobility Bed Mobility Overal bed mobility: Modified Independent                Transfers Overall transfer level: Modified independent   Transfers: Sit to/from Stand Sit to Stand: Modified independent (Device/Increase time)                    ADL Overall ADL's : Modified independent Eating/Feeding: Modified independent                   Lower Body Dressing: Modified independent                       Vision Vision Assessment?: No apparent visual deficits          Pertinent Vitals/Pain Pain Assessment: No/denies pain     Hand Dominance Right   Extremity/Trunk Assessment Upper Extremity Assessment Upper Extremity Assessment: Overall WFL for tasks assessed   Lower Extremity Assessment Lower Extremity Assessment: Defer to PT evaluation       Communication Communication Communication: No difficulties   Cognition Arousal/Alertness: Awake/alert Behavior During Therapy: WFL for tasks assessed/performed Overall Cognitive Status: Within Functional Limits for tasks assessed                                Home Living Family/patient expects to be discharged to:: Private residence Living Arrangements: Spouse/significant other Available Help at Discharge: Family               Bathroom Shower/Tub: Teacher, early years/pre: Standard     Home Equipment: Environmental consultant - 2 wheels;Toilet riser          Prior Functioning/Environment Level of Independence: Independent with assistive device(s)  OT Problem List: Pain    End of Session    Activity Tolerance: Patient tolerated treatment well Patient left: in bed;with call bell/phone within reach   Time: 0828-0845 OT Time Calculation (min): 17 min Charges:  OT General Charges $OT Visit: 1 Procedure OT Evaluation $OT Eval Low Complexity: 1 Procedure G-Codes: OT G-codes **NOT FOR INPATIENT CLASS** Functional Assessment Tool Used: clinical judgement Functional Limitation: Self care Self Care Current Status [id number]): At least 20 percent but less than 40 percent impaired, limited or restricted Self Care Goal Status [id number]): At least 20 percent but less than 40 percent impaired, limited or restricted Self  Care Discharge Status [phone]): At least 20 percent but less than 40 percent impaired, limited or restricted  Guadelupe Sabin, OTR/L  [phone]  12/09/2015, 8:58 AM

## 2015-12-09 NOTE — Evaluation (Signed)
Physical Therapy Evaluation Patient Details Name: Jeffrey Trujillo MRN: [id number] DOB: [birth year] Today's Date: 12/09/2015   History of Present Illness  65yo white male [facility] with acute worsenign of knee pain, and bilat wrist pain, admitted for gout episode. 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 Pt reports he has flares of gout which limit his mobility, but otherwise is fully indep at home. Pt was admitted here on 3/1 previously.   Clinical Impression  Pt presenting at baseline, performing limited community distance ambulation, at supervision without LOB. Most recent labs reveal mild anemia, but patient offers no complaint of dizziness, SOB, nor aberrant vital signs (Hb: 9.3 --> 7.6 in 2 days; HCT: 27.5 --> 22.6 in 2 days). Patient demonstrates mild impairment of balance, scoring 50/56 on Berg Balance Test, and decreased gait speed which also puts him at risk for falls. Pt will benefit from skilled PT intervention after DC to improve gait, improve balance, reduce falls risk, and to decrease likelihood of being hospitalized, however pt reports he is not interested in any PT at DC. No additional PT services needed at this time; will sign off.      Follow Up Recommendations No PT follow up    Equipment Recommendations  None recommended by PT    Recommendations for Other Services       Precautions / Restrictions Precautions Precautions: None Precaution Comments: no score available.       Mobility  Bed Mobility Overal bed mobility: Independent                Transfers Overall transfer level: Independent                  Ambulation/Gait Ambulation/Gait assistance: Supervision Ambulation Distance (Feet): 400 Feet Assistive device: None   Gait velocity: 0.17m/s Gait velocity interpretation: Below normal speed for age/gender General Gait Details: Walks and talks, no S/Sx of recent anemic lab values.   Stairs             Wheelchair Mobility    Modified Rankin (Stroke Patients Only)       Balance Overall balance assessment: No apparent balance deficits (not formally assessed)                               Standardized Balance Assessment Standardized Balance Assessment : Berg Balance Test Berg Balance Test Sit to Stand: Able to stand  independently using hands Standing Unsupported: Able to stand safely 2 minutes Sitting with Back Unsupported but Feet Supported on Floor or Stool: Able to sit safely and securely 2 minutes Stand to Sit: Sits safely with minimal use of hands Transfers: Able to transfer safely, minor use of hands Standing Unsupported with Eyes Closed: Able to stand 10 seconds safely Standing Ubsupported with Feet Together: Able to place feet together independently and stand 1 minute safely From Standing, Reach Forward with Outstretched Arm: Can reach forward >12 cm safely (5") From Standing Position, Pick up Object from Floor: Able to pick up shoe safely and easily From Standing Position, Turn to Look Behind Over each Shoulder: Looks behind one side only/other side shows less weight shift Turn 360 Degrees: Able to turn 360 degrees safely in 4 seconds or less Standing Unsupported, Alternately Place Feet on Step/Stool: Able to stand independently and safely and complete 8 steps in 20 seconds Standing Unsupported, One Foot in Front: Able to place foot tandem  independently and hold 30 seconds Standing on One Leg: Tries to lift leg/unable to hold 3 seconds but remains standing independently Total Score: 50         Pertinent Vitals/Pain Pain Assessment: No/denies pain    Home Living   Living Arrangements: Spouse/significant other Available Help at Discharge: Family           Home Equipment: Gilford Rile - 2 wheels;Cane - single point      Prior Function Level of Independence: Independent               Hand Dominance   Dominant Hand: Right    Extremity/Trunk  Assessment   Upper Extremity Assessment: Overall WFL for tasks assessed           Lower Extremity Assessment: Overall WFL for tasks assessed;Generalized weakness         Communication   Communication: No difficulties  Cognition Arousal/Alertness: Awake/alert Behavior During Therapy: WFL for tasks assessed/performed Overall Cognitive Status: Within Functional Limits for tasks assessed                      General Comments      Exercises        Assessment/Plan    PT Assessment Patent does not need any further PT services  PT Diagnosis Abnormality of gait;Difficulty walking   PT Problem List    PT Treatment Interventions     PT Goals (Current goals can be found in the Care Plan section) Acute Rehab PT Goals PT Goal Formulation: All assessment and education complete, DC therapy    Frequency     Barriers to discharge        Co-evaluation               End of Session Equipment Utilized During Treatment: Gait belt Activity Tolerance: Patient tolerated treatment well;No increased pain Patient left: with call bell/phone within reach      Functional Assessment Tool Used: Merrilee Jansky Balance Test  Functional Limitation: Mobility: Walking and moving around Mobility: Walking and Moving Around Current Status [phone]): At least 1 percent but less than 20 percent impaired, limited or restricted Mobility: Walking and Moving Around Goal Status [phone]): At least 1 percent but less than 20 percent impaired, limited or restricted Mobility: Walking and Moving Around Discharge Status [phone]): At least 1 percent but less than 20 percent impaired, limited or restricted    Time: 1135-1146 PT Time Calculation (min) (ACUTE ONLY): 11 min   Charges:   PT Evaluation $PT Eval Low Complexity: 1 Procedure PT Treatments $Therapeutic Activity: 8-22 mins   PT G Codes:   PT G-Codes **NOT FOR INPATIENT CLASS** Functional Assessment Tool Used: Berg Balance Test  Functional  Limitation: Mobility: Walking and moving around Mobility: Walking and Moving Around Current Status [phone]): At least 1 percent but less than 20 percent impaired, limited or restricted Mobility: Walking and Moving Around Goal Status [phone]): At least 1 percent but less than 20 percent impaired, limited or restricted Mobility: Walking and Moving Around Discharge Status [phone]): At least 1 percent but less than 20 percent impaired, limited or restricted    12:58 PM, 12/09/2015 Etta Grandchild, PT, DPT PRN Physical Therapist at [place] License # [id number] (wireless)  [phone] (mobile)

## 2015-12-09 NOTE — Care Management Note (Signed)
Case Management Note  Patient Details  Name: Jeffrey Trujillo MRN: [id number] Date of Birth: 1951/08/06  Subjective/Objective:                  Pt is from home, lives with his wife and is ind with ADL's. Pt has no HH services or DME needs PTA. Pt uses a walker for ambulation and is thinking of buying a shower bench but understands insurance will not pay for this and it will have to come OOP. Pt states he has all necessary DME and modifications to his home for him and his wife. Pt plans to return home with self care.   Action/Plan: Pt discharging home today. No CM needs.   Expected Discharge Date:  12/10/15               Expected Discharge Plan:  Home/Self Care  In-House Referral:  NA  Discharge planning Services  CM Consult  Post Acute Care Choice:  NA Choice offered to:  NA  DME Arranged:    DME Agency:     HH Arranged:    HH Agency:     Status of Service:  Completed, signed off  Medicare Important Message Given:    Date Medicare IM Given:    Medicare IM give by:    Date Additional Medicare IM Given:    Additional Medicare Important Message give by:     If discussed at [place] of Stay Meetings, dates discussed:    Additional Comments:  Sherald Barge, RN 12/09/2015, 12:07 PM

## 2015-12-09 NOTE — Telephone Encounter (Signed)
s.w. pt wife and advised on 3.31 appt ok and aware

## 2015-12-12 LAB — CULTURE, BLOOD (ROUTINE X 2): Culture: NO GROWTH

## 2015-12-16 ENCOUNTER — Other Ambulatory Visit (HOSPITAL_BASED_OUTPATIENT_CLINIC_OR_DEPARTMENT_OTHER): Payer: BLUE CROSS/BLUE SHIELD

## 2015-12-16 ENCOUNTER — Ambulatory Visit (HOSPITAL_BASED_OUTPATIENT_CLINIC_OR_DEPARTMENT_OTHER): Payer: BLUE CROSS/BLUE SHIELD | Admitting: Oncology

## 2015-12-16 ENCOUNTER — Telehealth: Payer: Self-pay | Admitting: Oncology

## 2015-12-16 VITALS — BP 136/80 | HR 118 | Temp 98.0°F | Resp 16 | Wt 164.0 lb

## 2015-12-16 DIAGNOSIS — C649 Malignant neoplasm of unspecified kidney, except renal pelvis: Secondary | ICD-10-CM

## 2015-12-16 DIAGNOSIS — C7889 Secondary malignant neoplasm of other digestive organs: Secondary | ICD-10-CM | POA: Diagnosis not present

## 2015-12-16 DIAGNOSIS — C642 Malignant neoplasm of left kidney, except renal pelvis: Secondary | ICD-10-CM | POA: Diagnosis not present

## 2015-12-16 DIAGNOSIS — M7989 Other specified soft tissue disorders: Secondary | ICD-10-CM

## 2015-12-16 DIAGNOSIS — D649 Anemia, unspecified: Secondary | ICD-10-CM | POA: Diagnosis not present

## 2015-12-16 DIAGNOSIS — D696 Thrombocytopenia, unspecified: Secondary | ICD-10-CM

## 2015-12-16 LAB — COMPREHENSIVE METABOLIC PANEL
ALBUMIN: 3.2 g/dL — AB (ref 3.5–5.0)
ALK PHOS: 72 U/L (ref 40–150)
ALT: 24 U/L (ref 0–55)
AST: 16 U/L (ref 5–34)
Anion Gap: 8 mEq/L (ref 3–11)
BUN: 13.1 mg/dL (ref 7.0–26.0)
CALCIUM: 8.8 mg/dL (ref 8.4–10.4)
CO2: 25 mEq/L (ref 22–29)
Chloride: 107 mEq/L (ref 98–109)
Creatinine: 1.6 mg/dL — ABNORMAL HIGH (ref 0.7–1.3)
EGFR: 45 mL/min/{1.73_m2} — ABNORMAL LOW (ref >90)
Glucose: 95 mg/dl (ref 70–140)
Potassium: 3.7 mEq/L (ref 3.5–5.1)
Sodium: 140 mEq/L (ref 136–145)
Total Bilirubin: 0.83 mg/dL (ref 0.20–1.20)
Total Protein: 6.8 g/dL (ref 6.4–8.3)

## 2015-12-16 LAB — CBC WITH DIFFERENTIAL/PLATELET
BASO%: 0 % (ref 0.0–2.0)
Basophils Absolute: 0 10*3/uL (ref 0.0–0.1)
EOS%: 0.8 % (ref 0.0–7.0)
Eosinophils Absolute: 0 10*3/uL (ref 0.0–0.5)
HCT: 33.9 % — ABNORMAL LOW (ref 38.4–49.9)
HGB: 10.6 g/dL — ABNORMAL LOW (ref 13.0–17.1)
LYMPH#: 0.6 10*3/uL — AB (ref 0.9–3.3)
LYMPH%: 14.8 % (ref 14.0–49.0)
MCH: 32 pg (ref 27.2–33.4)
MCHC: 31.3 g/dL — ABNORMAL LOW (ref 32.0–36.0)
MCV: 102.4 fL — ABNORMAL HIGH (ref 79.3–98.0)
MONO#: 0.5 10*3/uL (ref 0.1–0.9)
MONO%: 11.5 % (ref 0.0–14.0)
NEUT%: 72.9 % (ref 39.0–75.0)
NEUTROS ABS: 2.9 10*3/uL (ref 1.5–6.5)
PLATELETS: 201 10*3/uL (ref 140–400)
RBC: 3.31 10*6/uL — AB (ref 4.20–5.82)
RDW: 18.2 % — ABNORMAL HIGH (ref 11.0–14.6)
WBC: 3.9 10*3/uL — AB (ref 4.0–10.3)

## 2015-12-16 NOTE — Telephone Encounter (Signed)
Gave and printed appt sched and avs for pt for April °

## 2015-12-16 NOTE — Progress Notes (Signed)
Hematology and Oncology Follow Up Visit  Jeffrey [name] Jun 22, 1951     Principle Diagnosis: 65 year old with the following issues:  1. T1b right kidney cancer diagnosed in 2010. He is S/P rdical nephrectomy on June 27, 2009. The pathological staging was T1b clear cell histology with Fuhrman grade 3/4.  2. Left Kidney mass  Measuring 4.5 x 2 7 x 3.6 irregular enhancing appears to be a new kidney cancer noted in 07/2012. 3. Pancreatic lesion. 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  Current therapy: Votrient  800 mg daily started in December 2013. Treatment held in 11/2012 due to increase LFTs. Votrient was resumed in April 2014 at 400 mg daily. His dose was reduced further to 200 mg daily starting in September 2016.  Interim History: Jeffrey Trujillo presents for a follow up visit. Since his last visit, he missed his previous appointments because of recurrent hospitalizations. He was hospitalized twice for an extended period of time between February and March 2017. He developed a perirectal abscess, diverticulitis in gout on multiple occasions. He is currently recover from these episodes. For the last 2 months, he had stopped of Votrient and have just resumed it recently. Upon resumption of this medication, he reports no complications related to it. He still have rectal pain related to his abscess which is improving. His gallop of also improved.  His appetite remains poor although it is improving slowly. He is not reporting any postprandial diarrhea at this time but did report some occasional constipation. He denied any abdominal pain, distention or any further hematochezia.   He is not having any fevers or chills. He did not report any headaches blurred vision double vision. Does not report any motor sensory neuropathy or alteration of mental status. He does not report any depression or anxiety. He does not report any frequency urgency or hesitancy. He does not report  any other urinary symptoms. He does report some difficulty sleeping. Remainder of his review of systems is unremarkable.  Medications: Unchanged by my review today. Current Outpatient Prescriptions  Medication Sig Dispense Refill  . diphenhydrAMINE (BENADRYL) 25 MG tablet Take 25 mg by mouth daily as needed for itching.    . loperamide (IMODIUM) 2 MG capsule Take 1 capsule (2 mg total) by mouth as needed for diarrhea or loose stools. 30 capsule 0  . oxyCODONE-acetaminophen (PERCOCET/ROXICET) 5-325 MG tablet Take 1-2 tablets by mouth every 6 (six) hours as needed for severe pain. 20 tablet 0  . pantoprazole (PROTONIX) 40 MG tablet TAKE (1) TABLET BY MOUTH TWICE A DAY BEFORE MEALS. (BREAKFAST AND SUPPER) 60 tablet 5  . pazopanib (VOTRIENT) 200 MG tablet Take 2 tablets by mouth, daily on an empty stomach 60 tablet 0  . predniSONE (DELTASONE) 20 MG tablet Take 2 tablets (40 mg total) by mouth daily before breakfast. Take for 5 days, then stop. 10 tablet 0  . topiramate (TOPAMAX) 25 MG tablet Take 25 mg by mouth 2 (two) times daily.     No current facility-administered medications for this visit.     Allergies:  Allergies  Allergen Reactions  . Nsaids Other (See Comments)    Patient only has one kidney.   . Meperidine Hcl Hives and Rash    Past Medical History, Surgical history, Social history, and Family History were reviewed and updated.   Physical Exam: Blood pressure 136/80, pulse 118, temperature 98 F (36.7 C), temperature source Oral, resp. rate 16, weight 164 lb (74.39 kg),  SpO2 100 %. ECOG: 1 General appearance: alert and awake Appeared in little distress Head: Normocephalic, no obvious trauma or induration noted. No oral thrush noted. Neck: no adenopathy, no masses Lymph nodes: Cervical, supraclavicular, and axillary nodes normal. Heart:regular rate and rhythm, S1, S2.  Lung:chest clear, no wheezing, or dullness to percussion. Abdomen: soft, non-tender, without masses or  organomegaly. No shifting dullness or ascites. EXT: Left lower leg edema noted around 1+ at the level of the ankle. Neurological exam: no motor or sensory deficits noted. Skin: No rashes or lesions noted. no petechiae or ecchymosis.  Neurological: Intact no deficits. No motor or sensory deficits.  CBC    Component Value Date/Time   WBC 3.9* 12/16/2015 1226   WBC 3.8* 12/09/2015 0531   RBC 3.31* 12/16/2015 1226   RBC 2.34* 12/09/2015 0531   RBC 2.33* 12/08/2015 0956   HGB 10.6* 12/16/2015 1226   HGB 7.6* 12/09/2015 0531   HCT 33.9* 12/16/2015 1226   HCT 22.6* 12/09/2015 0531   PLT 201 12/16/2015 1226   PLT 141* 12/09/2015 0531   MCV 102.4* 12/16/2015 1226   MCV 96.6 12/09/2015 0531   MCH 32.0 12/16/2015 1226   MCH 32.5 12/09/2015 0531   MCHC 31.3* 12/16/2015 1226   MCHC 33.6 12/09/2015 0531   RDW 18.2* 12/16/2015 1226   RDW 16.6* 12/09/2015 0531   LYMPHSABS 0.6* 12/16/2015 1226   LYMPHSABS 0.6* 12/07/2015 1050   MONOABS 0.5 12/16/2015 1226   MONOABS 0.6 12/07/2015 1050   EOSABS 0.0 12/16/2015 1226   EOSABS 0.0 12/07/2015 1050   BASOSABS 0.0 12/16/2015 1226   BASOSABS 0.0 12/07/2015 1050    EXAM: CT CHEST, ABDOMEN, AND PELVIS WITH CONTRAST  TECHNIQUE: Multidetector CT imaging of the chest, abdomen and pelvis was performed following the standard protocol during bolus administration of intravenous contrast.  CONTRAST: 159mL OMNIPAQUE IOHEXOL 300 MG/ML SOLN  COMPARISON: 11/12/2015. 09/13/2015.  FINDINGS: CT CHEST  Tiny hypodensity in the left lobe of the thyroid gland.  No abnormal mediastinal adenopathy. No pericardial effusion.  No pneumothorax or pleural effusion  Focal thickening of the right minor fissure is not significantly changed. See image 36 of series 4 on coronal reformats. Scarring in the central left lower lobe extending to the left base is stable. 3 mm left upper lobe nodule on image 21 is stable. Pleural nodules in the posterior  left hemi thorax on image 23 are stable. Reticulonodular opacities have developed at the posterior left lung base.  No acute bony deformity. Chronic changes of the left sixth rib are noted.  CT ABDOMEN AND PELVIS  Hypodensity in the left lobe of the liver measuring 8 mm on image 50 is stable. Diffuse hepatic steatosis  Gallbladder is unremarkable.  Adrenal glands are within normal limits. There is some nodularity without focal mass on the left.  Left renal mass on image 66 in the upper pole measures 4.8 cm. It is not significantly changed. Benign cyst in the upper pole of the left kidney is noted. Status post right nephrectomy.  There is an ill-defined area of low density within the body of the pancreas on image 63 measuring 1.8 cm worrisome for adenocarcinoma.  Spleen is upper normal in size.  Appendix is within normal limits.  There is diverticulosis of the descending and sigmoid colon. There is wall thickening of the sigmoid colon. There is stranding in the adjacent fat. There is also stranding in the perirectal fat. A single air bubble is seen in the anterior right ischiorectal  fossae on image 122. This is in the location of the patient's previously seen abscess. It is extraluminal. There is thickening of the wall of the distal rectum adjacent to this gas. No fluid filled abscess is identified. There is stranding in extending from this area inferiorly towards the skin of the inner right buttock. Overall, the previously described abscess has nearly resolved.  Bladder is decompressed. Prostate is unremarkable.  No evidence of retroperitoneal adenopathy.  IMPRESSION: There are reticulonodular opacities at the posterior left lung base worrisome for an inflammatory process.  The abscess in the right ischiorectal fossa adjacent to the rectum has markedly improved. There is a small extraluminal gas bubble in the location of the previously seen abscess  which is residual. There is also residual wall thickening of the adjacent rectum. There is stranding in the perirectal fat. There is mild inflammatory change of the sigmoid colon. No fluid filled abscess  1.8 cm mass in the body of the pancreas is worrisome for adenocarcinoma metastatic disease.  Stable hypodensity in the left lobe of the liver this supports benign etiology.  Stable left renal mass.    Impression and Plan:   A 65 year old gentleman with the following issues:   1. A history of renal cell carcinoma. He had stage T1b diagnosed in October 2010 of the right kidney, status post right nephrectomy. He also has left kidney cancer and pancreatic metastasis. He was initially started on Votrient in December 2013. He had dose reduction on few occasions. The patient currently on Votrient 200 mg daily since September 2016 dose reduction.  CT scan obtained on 12/07/2015 was personally reviewed. It does not appear that he had any wide spread metastatic disease at this time. His pancreatic lesions have increased slightly in size. Options of therapy were reviewed today including switching his Votrient to a different salvage therapy versus continue on Votrient and repeat imaging studies in 2-3 months. Given the fact that he has not taken this medication consistently for the last 2 months could have contributed to his lip progression of disease. After discussion today, we have elected to proceed with Votrient and repeat imaging studies in 2 months.  2. Pancreatic lesion. Biopsy proven to be malignant and likely represent renal cell cancer. It is more prominent on his recent scan. We'll continue to monitor and switch to a different salvage therapy if it continues to increase in size. I do not see any need to repeat biopsy.  3. Thrombocytopenia:  Likely related to Votrient. His platelet count is back to normal range.  4. Anemia.  Hemoglobin is adequate at this time without any symptoms. He  does not require any transfusions.  5. Increase LFTs:  related to Votrient his current transaminases are back within normal range. No changes since the last visit.  6. Weight loss: his weight is improving after his recent discharge.  7. Perirectal abscess: He continues to follow-up with surgery and currently healing slowly.  8. Follow-up:  4 weeks for evaluation.  [name] MD 12/16/2015

## 2015-12-28 ENCOUNTER — Encounter: Payer: Self-pay | Admitting: Oncology

## 2015-12-28 NOTE — Progress Notes (Signed)
Sent prior auth req for votrient. Wife left message it was needed.

## 2015-12-30 ENCOUNTER — Encounter: Payer: Self-pay | Admitting: *Deleted

## 2015-12-30 ENCOUNTER — Other Ambulatory Visit: Payer: Self-pay | Admitting: *Deleted

## 2015-12-30 DIAGNOSIS — C649 Malignant neoplasm of unspecified kidney, except renal pelvis: Secondary | ICD-10-CM

## 2015-12-30 MED ORDER — PAZOPANIB HCL 200 MG PO TABS: 200.0000 mg | ORAL_TABLET | Freq: Every day | ORAL | Status: DC

## 2016-01-03 ENCOUNTER — Encounter: Payer: Self-pay | Admitting: Internal Medicine

## 2016-01-13 ENCOUNTER — Other Ambulatory Visit: Payer: BLUE CROSS/BLUE SHIELD

## 2016-01-13 ENCOUNTER — Telehealth: Payer: Self-pay | Admitting: Oncology

## 2016-01-13 ENCOUNTER — Ambulatory Visit: Payer: BLUE CROSS/BLUE SHIELD | Admitting: Oncology

## 2016-01-13 NOTE — Telephone Encounter (Signed)
pt sick called to r/s....pt aware of new d.t

## 2016-01-16 ENCOUNTER — Other Ambulatory Visit: Payer: Self-pay | Admitting: *Deleted

## 2016-01-16 DIAGNOSIS — C649 Malignant neoplasm of unspecified kidney, except renal pelvis: Secondary | ICD-10-CM

## 2016-01-16 MED ORDER — PAZOPANIB HCL 200 MG PO TABS: 200.0000 mg | ORAL_TABLET | Freq: Every day | ORAL | Status: DC

## 2016-01-27 ENCOUNTER — Telehealth: Payer: Self-pay | Admitting: Oncology

## 2016-01-27 ENCOUNTER — Ambulatory Visit (HOSPITAL_BASED_OUTPATIENT_CLINIC_OR_DEPARTMENT_OTHER): Payer: BLUE CROSS/BLUE SHIELD | Admitting: Oncology

## 2016-01-27 ENCOUNTER — Other Ambulatory Visit (HOSPITAL_BASED_OUTPATIENT_CLINIC_OR_DEPARTMENT_OTHER): Payer: BLUE CROSS/BLUE SHIELD

## 2016-01-27 VITALS — BP 152/62 | HR 67 | Temp 98.6°F | Resp 18 | Ht 71.0 in | Wt 169.3 lb

## 2016-01-27 DIAGNOSIS — D649 Anemia, unspecified: Secondary | ICD-10-CM | POA: Diagnosis not present

## 2016-01-27 DIAGNOSIS — C259 Malignant neoplasm of pancreas, unspecified: Secondary | ICD-10-CM

## 2016-01-27 DIAGNOSIS — C642 Malignant neoplasm of left kidney, except renal pelvis: Secondary | ICD-10-CM

## 2016-01-27 DIAGNOSIS — Z85528 Personal history of other malignant neoplasm of kidney: Secondary | ICD-10-CM

## 2016-01-27 DIAGNOSIS — D696 Thrombocytopenia, unspecified: Secondary | ICD-10-CM | POA: Diagnosis not present

## 2016-01-27 LAB — CBC WITH DIFFERENTIAL/PLATELET
BASO%: 0.5 % (ref 0.0–2.0)
Basophils Absolute: 0 10*3/uL (ref 0.0–0.1)
EOS ABS: 0 10*3/uL (ref 0.0–0.5)
EOS%: 0.5 % (ref 0.0–7.0)
HEMATOCRIT: 29.5 % — AB (ref 38.4–49.9)
HEMOGLOBIN: 9.2 g/dL — AB (ref 13.0–17.1)
LYMPH#: 0.8 10*3/uL — AB (ref 0.9–3.3)
LYMPH%: 19 % (ref 14.0–49.0)
MCH: 31.4 pg (ref 27.2–33.4)
MCHC: 31.2 g/dL — ABNORMAL LOW (ref 32.0–36.0)
MCV: 100.7 fL — ABNORMAL HIGH (ref 79.3–98.0)
MONO#: 0.3 10*3/uL (ref 0.1–0.9)
MONO%: 7.7 % (ref 0.0–14.0)
NEUT%: 72.3 % (ref 39.0–75.0)
NEUTROS ABS: 2.9 10*3/uL (ref 1.5–6.5)
PLATELETS: 206 10*3/uL (ref 140–400)
RBC: 2.93 10*6/uL — ABNORMAL LOW (ref 4.20–5.82)
RDW: 18.7 % — ABNORMAL HIGH (ref 11.0–14.6)
WBC: 4.1 10*3/uL (ref 4.0–10.3)

## 2016-01-27 LAB — COMPREHENSIVE METABOLIC PANEL
ALBUMIN: 3.1 g/dL — AB (ref 3.5–5.0)
ALT: 11 U/L (ref 0–55)
ANION GAP: 8 meq/L (ref 3–11)
AST: 17 U/L (ref 5–34)
Alkaline Phosphatase: 75 U/L (ref 40–150)
BILIRUBIN TOTAL: 0.33 mg/dL (ref 0.20–1.20)
BUN: 15.9 mg/dL (ref 7.0–26.0)
CALCIUM: 9 mg/dL (ref 8.4–10.4)
CO2: 25 mEq/L (ref 22–29)
CREATININE: 1.5 mg/dL — AB (ref 0.7–1.3)
Chloride: 112 mEq/L — ABNORMAL HIGH (ref 98–109)
EGFR: 48 mL/min/{1.73_m2} — ABNORMAL LOW (ref >90)
Glucose: 73 mg/dl (ref 70–140)
Potassium: 3.3 mEq/L — ABNORMAL LOW (ref 3.5–5.1)
Sodium: 145 mEq/L (ref 136–145)
TOTAL PROTEIN: 6.8 g/dL (ref 6.4–8.3)

## 2016-01-27 NOTE — Telephone Encounter (Signed)
Gave and printed appt shced and avs fo rpt for June...gv barium

## 2016-01-27 NOTE — Progress Notes (Signed)
Hematology and Oncology Follow Up Visit  Jeffrey Trujillo [id number] 17-Jul-1951     Principle Diagnosis: 65 year old with the following issues:  1. T1b right kidney cancer diagnosed in 2010. He is S/P rdical nephrectomy on June 27, 2009. The pathological staging was T1b clear cell histology with Fuhrman grade 3/4.  2. Left Kidney mass  Measuring 4.5 x 2 7 x 3.6 irregular enhancing appears to be a new kidney cancer noted in 07/2012. 3. Pancreatic lesion. 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  Current therapy: Votrient  800 mg daily started in December 2013. Treatment held in 11/2012 due to increase LFTs. Votrient was resumed in April 2014 at 400 mg daily. His dose was reduced further to 200 mg daily starting in September 2016.  Interim History: Mr. Perito presents for a follow up visit. Since his last visit, he reports no major changes in his health. He was recently diagnosed with gout currently on prednisone related to that. He does report pain in his lower extremities as well as swelling. His appetite have been reasonable and have gained some more weight. He has retired from his job at this time and no longer working. He is able to attend to her activities of daily living.  He continues to take Votrient once a day without any sde effects at this time. He does not report any nausea or abdominal distention. He denied any diarrhea or excessive fatigue. He continues to have reasonable quality of life.   He is not having any fevers or chills. He did not report any headaches blurred vision double vision. Does not report any motor sensory neuropathy or alteration of mental status. He does not report any depression or anxiety. He does not report any frequency urgency or hesitancy. He does not report any other urinary symptoms. He does report some difficulty sleeping. Remainder of his review of systems is unremarkable.  Medications: Unchanged by my review today. Current  Outpatient Prescriptions  Medication Sig Dispense Refill  . diphenhydrAMINE (BENADRYL) 25 MG tablet Take 25 mg by mouth daily as needed for itching.    . loperamide (IMODIUM) 2 MG capsule Take 1 capsule (2 mg total) by mouth as needed for diarrhea or loose stools. 30 capsule 0  . oxyCODONE-acetaminophen (PERCOCET/ROXICET) 5-325 MG tablet Take 1-2 tablets by mouth every 6 (six) hours as needed for severe pain. 20 tablet 0  . pantoprazole (PROTONIX) 40 MG tablet TAKE (1) TABLET BY MOUTH TWICE A DAY BEFORE MEALS. (BREAKFAST AND SUPPER) 60 tablet 5  . pazopanib (VOTRIENT) 200 MG tablet Take 1 tablet (200 mg total) by mouth daily. 30 tablet 0  . predniSONE (DELTASONE) 20 MG tablet Take 2 tablets (40 mg total) by mouth daily before breakfast. Take for 5 days, then stop. 10 tablet 0  . topiramate (TOPAMAX) 25 MG tablet Take 25 mg by mouth 2 (two) times daily.     No current facility-administered medications for this visit.     Allergies:  Allergies  Allergen Reactions  . Nsaids Other (See Comments)    Patient only has one kidney.   . Meperidine Hcl Hives and Rash    Past Medical History, Surgical history, Social history, and Family History were reviewed and updated.   Physical Exam: Blood pressure 152/62, pulse 67, temperature 98.6 F (37 C), temperature source Oral, resp. rate 18, height 5\' 11"  (1.803 m), weight 169 lb 4.8 oz (76.794 kg), SpO2 100 %. ECOG: 1 General appearance: pleasant appearing gentleman  without distress. Head: Normocephalic, no obvious trauma or induration noted. No oral ulcers or lesions. Neck: no adenopathy, no masses Lymph nodes: Cervical, supraclavicular, and axillary nodes normal. Heart:regular rate and rhythm, S1, S2.  Lung:chest clear, no wheezing, or dullness to percussion. Abdomen: soft, non-tender, without masses or organomegaly. No shifting dullness or ascites. EXT: bilateral edema noted. Neurological exam: no motor or sensory deficits noted. Skin: No  rashes or lesions noted. no petechiae or ecchymosis.  Neurological: Intact no deficits. No motor or sensory deficits.  CBC    Component Value Date/Time   WBC 4.1 01/27/2016 1215   WBC 3.8* 12/09/2015 0531   RBC 2.93* 01/27/2016 1215   RBC 2.34* 12/09/2015 0531   RBC 2.33* 12/08/2015 0956   HGB 9.2* 01/27/2016 1215   HGB 7.6* 12/09/2015 0531   HCT 29.5* 01/27/2016 1215   HCT 22.6* 12/09/2015 0531   PLT 206 01/27/2016 1215   PLT 141* 12/09/2015 0531   MCV 100.7* 01/27/2016 1215   MCV 96.6 12/09/2015 0531   MCH 31.4 01/27/2016 1215   MCH 32.5 12/09/2015 0531   MCHC 31.2* 01/27/2016 1215   MCHC 33.6 12/09/2015 0531   RDW 18.7* 01/27/2016 1215   RDW 16.6* 12/09/2015 0531   LYMPHSABS 0.8* 01/27/2016 1215   LYMPHSABS 0.6* 12/07/2015 1050   MONOABS 0.3 01/27/2016 1215   MONOABS 0.6 12/07/2015 1050   EOSABS 0.0 01/27/2016 1215   EOSABS 0.0 12/07/2015 1050   BASOSABS 0.0 01/27/2016 1215   BASOSABS 0.0 12/07/2015 1050       Impression and Plan:   A 65 year old gentleman with the following issues:   1. A history of renal cell carcinoma. He had stage T1b diagnosed in October 2010 of the right kidney, status post right nephrectomy. He also has left kidney cancer and pancreatic metastasis. He was initially started on Votrient in December 2013. He had dose reduction on few occasions. The patient currently on Votrient 200 mg daily since September 2016 dose reduction.  CT scan obtained on 12/07/2015 did not show any wide spread metastatic disease at this time. He is currently on Votrient and have tolerated it well. The plan is to continue with the same dose and schedule and repeat CT scan before the next visit. Different salvage therapy will be used if he has progression of disease.  2. Pancreatic lesion. Biopsy proven to be malignant and likely represent renal cell cancer. It is more prominent on his recent scan. Repeat imaging studies in June 2017 we'll further determine the  etiology.  3. Thrombocytopenia:  Likely related to Votrient. His platelet count is back to normal range.  4. Anemia.  Hemoglobin remains stable. No need for any transfusion or growth factor support.  5. Increase LFTs:  related to Votrient his current transaminases are back within normal range. No changes since the last visit.  6. Weight loss: his weight is improving since the last visit.  7. Perirectal abscess: appears to have healed properly.  8. Follow-up:  4 weeks for evaluation.  [name] MD 01/27/2016

## 2016-02-07 ENCOUNTER — Telehealth: Payer: Self-pay

## 2016-02-07 NOTE — Telephone Encounter (Signed)
Wife and pt called. He is still having ankle and foot swelling. He saw [place] [facility] 2 days prior to Dr Alen Blew visit 5/12. They said to talk with Dr Alen Blew. Dr Alen Blew did not address ankle swelling on OV note except to note it's presence. Pt states it is no better, but no worse than 5/12 OV. He denies any resp distress, no UE swelling, no increased redness to feet. He cannot get his shoe on and has to wear slippers. He tried compression hose and they were too tight. He is keeping legs elevated with no difference. He is asking if Dr Alen Blew has any other advice for him.

## 2016-02-26 ENCOUNTER — Encounter (HOSPITAL_COMMUNITY): Payer: Self-pay | Admitting: *Deleted

## 2016-02-26 ENCOUNTER — Emergency Department (HOSPITAL_COMMUNITY)
Admission: EM | Admit: 2016-02-26 | Discharge: 2016-02-26 | Disposition: A | Discharge status: Home / Self Care | Payer: BLUE CROSS/BLUE SHIELD | Attending: Emergency Medicine | Admitting: Emergency Medicine

## 2016-02-26 ENCOUNTER — Emergency Department (HOSPITAL_COMMUNITY): Payer: BLUE CROSS/BLUE SHIELD

## 2016-02-26 DIAGNOSIS — M5030 Other cervical disc degeneration, unspecified cervical region: Secondary | ICD-10-CM | POA: Diagnosis not present

## 2016-02-26 DIAGNOSIS — K2270 Barrett's esophagus without dysplasia: Secondary | ICD-10-CM | POA: Diagnosis not present

## 2016-02-26 DIAGNOSIS — Z85528 Personal history of other malignant neoplasm of kidney: Secondary | ICD-10-CM | POA: Diagnosis not present

## 2016-02-26 DIAGNOSIS — K610 Anal abscess: Secondary | ICD-10-CM | POA: Diagnosis not present

## 2016-02-26 DIAGNOSIS — Z902 Acquired absence of lung [part of]: Secondary | ICD-10-CM | POA: Diagnosis not present

## 2016-02-26 DIAGNOSIS — R197 Diarrhea, unspecified: Secondary | ICD-10-CM | POA: Insufficient documentation

## 2016-02-26 DIAGNOSIS — L539 Erythematous condition, unspecified: Secondary | ICD-10-CM | POA: Diagnosis present

## 2016-02-26 DIAGNOSIS — Z79899 Other long term (current) drug therapy: Secondary | ICD-10-CM | POA: Diagnosis not present

## 2016-02-26 DIAGNOSIS — Z87891 Personal history of nicotine dependence: Secondary | ICD-10-CM | POA: Insufficient documentation

## 2016-02-26 DIAGNOSIS — R112 Nausea with vomiting, unspecified: Secondary | ICD-10-CM | POA: Diagnosis not present

## 2016-02-26 LAB — COMPREHENSIVE METABOLIC PANEL
ALBUMIN: 3.2 g/dL — AB (ref 3.5–5.0)
ALK PHOS: 82 U/L (ref 38–126)
ALT: 14 U/L — ABNORMAL LOW (ref 17–63)
ANION GAP: 8 (ref 5–15)
AST: 20 U/L (ref 15–41)
BILIRUBIN TOTAL: 0.9 mg/dL (ref 0.3–1.2)
BUN: 26 mg/dL — ABNORMAL HIGH (ref 6–20)
CALCIUM: 9.1 mg/dL (ref 8.9–10.3)
CO2: 30 mmol/L (ref 22–32)
Chloride: 98 mmol/L — ABNORMAL LOW (ref 101–111)
Creatinine, Ser: 1.87 mg/dL — ABNORMAL HIGH (ref 0.61–1.24)
GFR calc non Af Amer: 36 mL/min — ABNORMAL LOW (ref >60)
GFR, EST AFRICAN AMERICAN: 42 mL/min — AB (ref >60)
GLUCOSE: 150 mg/dL — AB (ref 65–99)
POTASSIUM: 3.7 mmol/L (ref 3.5–5.1)
SODIUM: 136 mmol/L (ref 135–145)
TOTAL PROTEIN: 7.6 g/dL (ref 6.5–8.1)

## 2016-02-26 LAB — CBC WITH DIFFERENTIAL/PLATELET
Basophils Absolute: 0 10*3/uL (ref 0.0–0.1)
Basophils Relative: 0 %
Eosinophils Absolute: 0 10*3/uL (ref 0.0–0.7)
Eosinophils Relative: 1 %
HCT: 26.5 % — ABNORMAL LOW (ref 39.0–52.0)
Hemoglobin: 8.8 g/dL — ABNORMAL LOW (ref 13.0–17.0)
LYMPHS ABS: 0.3 10*3/uL — AB (ref 0.7–4.0)
LYMPHS PCT: 7 %
MCH: 31.7 pg (ref 26.0–34.0)
MCHC: 33.2 g/dL (ref 30.0–36.0)
MCV: 95.3 fL (ref 78.0–100.0)
MONO ABS: 0.3 10*3/uL (ref 0.1–1.0)
Monocytes Relative: 7 %
Neutro Abs: 4.2 10*3/uL (ref 1.7–7.7)
Neutrophils Relative %: 85 %
Platelets: 123 10*3/uL — ABNORMAL LOW (ref 150–400)
RBC: 2.78 MIL/uL — AB (ref 4.22–5.81)
RDW: 17.7 % — ABNORMAL HIGH (ref 11.5–15.5)
WBC: 4.8 10*3/uL (ref 4.0–10.5)

## 2016-02-26 MED ORDER — LIDOCAINE-EPINEPHRINE (PF) 1 %-1:200000 IJ SOLN
10.0000 mL | Freq: Once | INTRAMUSCULAR | Status: AC
  Administered 2016-02-26: 10 mL via INTRADERMAL
  Filled 2016-02-26 (×2): qty 30

## 2016-02-26 MED ORDER — CIPROFLOXACIN HCL 250 MG PO TABS
500.0000 mg | ORAL_TABLET | Freq: Once | ORAL | Status: AC
  Administered 2016-02-26: 500 mg via ORAL
  Filled 2016-02-26: qty 2

## 2016-02-26 MED ORDER — CIPROFLOXACIN HCL 500 MG PO TABS: 500.0000 mg | ORAL_TABLET | Freq: Two times a day (BID) | ORAL | Status: DC

## 2016-02-26 MED ORDER — OXYCODONE-ACETAMINOPHEN 5-325 MG PO TABS: 1.0000 | ORAL_TABLET | Freq: Four times a day (QID) | ORAL | Status: DC | PRN

## 2016-02-26 MED ORDER — HYDROMORPHONE HCL 1 MG/ML IJ SOLN
1.0000 mg | Freq: Once | INTRAMUSCULAR | Status: AC
  Administered 2016-02-26: 1 mg via INTRAVENOUS
  Filled 2016-02-26: qty 1

## 2016-02-26 MED ORDER — DEXTROSE 5 % IV SOLN
1.0000 g | Freq: Once | INTRAVENOUS | Status: AC
  Administered 2016-02-26: 1 g via INTRAVENOUS
  Filled 2016-02-26: qty 10

## 2016-02-26 MED ORDER — DIPHENHYDRAMINE HCL 50 MG/ML IJ SOLN
25.0000 mg | Freq: Once | INTRAMUSCULAR | Status: AC
  Administered 2016-02-26: 25 mg via INTRAVENOUS

## 2016-02-26 MED ORDER — ONDANSETRON HCL 4 MG/2ML IJ SOLN
4.0000 mg | Freq: Once | INTRAMUSCULAR | Status: AC
  Administered 2016-02-26: 4 mg via INTRAVENOUS
  Filled 2016-02-26: qty 2

## 2016-02-26 MED ORDER — SODIUM CHLORIDE 0.9 % IV BOLUS (SEPSIS)
500.0000 mL | Freq: Once | INTRAVENOUS | Status: AC
  Administered 2016-02-26: 500 mL via INTRAVENOUS

## 2016-02-26 MED ORDER — DOCUSATE SODIUM 100 MG PO CAPS: 100.0000 mg | ORAL_CAPSULE | Freq: Two times a day (BID) | ORAL | Status: DC

## 2016-02-26 MED ORDER — IOPAMIDOL (ISOVUE-300) INJECTION 61%
80.0000 mL | Freq: Once | INTRAVENOUS | Status: AC | PRN
  Administered 2016-02-26: 80 mL via INTRAVENOUS

## 2016-02-26 MED ORDER — HYDROMORPHONE HCL 1 MG/ML IJ SOLN
1.0000 mg | Freq: Once | INTRAMUSCULAR | Status: AC
  Administered 2016-02-26: 1 mg via INTRAVENOUS

## 2016-02-26 MED ORDER — DIPHENHYDRAMINE HCL 50 MG/ML IJ SOLN
INTRAMUSCULAR | Status: AC
  Administered 2016-02-26: 25 mg via INTRAVENOUS
  Filled 2016-02-26: qty 1

## 2016-02-26 MED ORDER — METRONIDAZOLE 500 MG PO TABS
250.0000 mg | ORAL_TABLET | Freq: Once | ORAL | Status: AC
  Administered 2016-02-26: 250 mg via ORAL
  Filled 2016-02-26: qty 1

## 2016-02-26 MED ORDER — HYDROMORPHONE HCL 1 MG/ML IJ SOLN
INTRAMUSCULAR | Status: AC
  Filled 2016-02-26: qty 1

## 2016-02-26 MED ORDER — OXYCODONE-ACETAMINOPHEN 5-325 MG PO TABS
1.0000 | ORAL_TABLET | Freq: Once | ORAL | Status: AC
  Administered 2016-02-26: 1 via ORAL
  Filled 2016-02-26: qty 1

## 2016-02-26 MED ORDER — METRONIDAZOLE 250 MG PO TABS: 250.0000 mg | ORAL_TABLET | Freq: Three times a day (TID) | ORAL | Status: AC

## 2016-02-26 NOTE — ED Notes (Signed)
Patient now has redness noted towards left upper forearm towards elbow. Patient complains of itching. Dr. Dayna Barker made aware. New verbal orders given.

## 2016-02-26 NOTE — ED Notes (Signed)
Pt reports a painful abscess perirectal area post I & D in march to same area.

## 2016-02-26 NOTE — ED Notes (Signed)
No redness noted to left forearm at this time. Patient denies itching.

## 2016-02-26 NOTE — ED Provider Notes (Signed)
CSN: [id number]     Arrival date & time 02/26/16  0908 History  By signing my name below, I, Jasmyn B. Alexander, attest that this documentation has been prepared under the direction and in the presence of Merrily Pew, MD.  Electronically Signed: Tedra Coupe. Sheppard Coil, ED Scribe. 02/26/2016. 10:40 AM.   Chief Complaint  Patient presents with  . Abscess   The history is provided by the patient. No language interpreter was used.    HPI Comments: Jeffrey Trujillo is a 65 y.o. male with PMHx of kidney cancer who presents to the Emergency Department complaining of gradual onset, worsening perirectal pain with erythematous abscess which began x 1 day. Per chart review, pt had a perirectal I&D abscess procedure on 11/13/15 by [name]. He notes that pain in area began yesterday, however it was relieved with use of epsom salt. He notes that pain returned and increased early morning on 02/26/16. Pt has associated diarrhea, nausea and vomiting while in ED and pain in right leg. Pt reports pain in area is exacerbated upon pressure. He notes that he had burning surrounding rectum 2 weeks ago, but has now resolved. He denies any abdominal pain or fever.   Past Medical History  Diagnosis Date  . Gout   . DJD (degenerative joint disease) of cervical spine   . Diverticulosis of colon     by colonoscopy 11/02/2009  . Barrett's esophagus     by EGD 11/02/2009  . Anemia   . [facility])     "was told has cancer on other kidney and spot on pancreas"  . 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 GERD (gastroesophageal reflux disease)    Past Surgical History  Procedure Laterality Date  . Nephrectomy      right due to cancer  . Lung removal, partial      left lower lobe, benign  . Eus  08/07/2012    Procedure: UPPER ENDOSCOPIC ULTRASOUND (EUS) LINEAR;  Surgeon: [name], MD;  Location: [facility];  Service: Endoscopy;  Laterality: N/A;  . Inguinal hernia repair    . Hernia repair     . Inguinal hernia repair      Right  . Eus  08/21/2012    Procedure: UPPER ENDOSCOPIC ULTRASOUND (EUS) LINEAR;  Surgeon: [name], MD;  Location: [facility];  Service: Endoscopy;  Laterality: N/A;  . Colonoscopy  Feb 2011    [name]: single anal papilla, pan-colonic diverticula  . Esophagogastroduodenoscopy  Feb 2011    [name]: salmon-colored epithelium consistent with Barrett's, nodular antral erosions, small hiatal hernia, negative H.pylori  . Colonoscopy  09/25/2012    RMR: Colonic diverticulosis. Suspect diverticular bleeding-hepatic flexure tic-sealed  . Esophagogastroduodenoscopy  09/25/2012    [id number] esophagus. Small hiatal hernia. Antral erosions of doubtful clinical significance, no biopsies  . Esophagogastroduodenoscopy N/A 03/11/2013    SLF: Barrett's esophagus/Single ulcer in the gastric antrum-PROBABLE SOURCE FOR BLACK STOOL/ Chronic gastritis in the gastric antrum  . Esophagogastroduodenoscopy N/A 10/11/2014    Dr. [name] barrett's s/p biopsy. Hiatal hernia. Biopsy with intestinal metaplasia, glandular atypia indefinite for low grade dysplasia noted as well  . Esophagogastroduodenoscopy N/A 01/19/2015    [name]: Barrett's no dysplasia  . Incision and drainage abscess N/A 11/13/2015    Procedure: INCISION AND DRAINAGE ABSCESS;  Surgeon: Aviva Signs, MD;  Location: [facility];  Service: General;  Laterality: N/A;   Family History  Problem Relation Age of  Onset  . Lung cancer Brother   . Prostate cancer Brother   . Colon cancer Neg Hx   . Lumbar disc disease Brother   . Prostate cancer Brother    Social History  Substance Use Topics  . Smoking status: Former Smoker -- 0.25 packs/day for 4 years    Types: Cigars  . Smokeless tobacco: Never Used     Comment: occasional cigar  . Alcohol Use: 1.2 - 1.8 oz/week    2-3 Cans of beer per week     Comment: vodka and budweiser, sometimes daily, depends on notion. couple drinks. 2-3 beers on a weekend. pt  says no etoh in 3 months    Review of Systems  Constitutional: Negative for fever.  Gastrointestinal: Positive for nausea, vomiting and diarrhea. Negative for abdominal pain.  Musculoskeletal: Positive for myalgias and arthralgias.  Skin: Positive for wound.  All other systems reviewed and are negative.   Allergies  Amoxicillin; Nsaids; Demerol; and Meperidine hcl  Home Medications   Prior to Admission medications   Medication Sig Start Date End Date Taking? Authorizing Provider  acetaminophen (TYLENOL) 650 MG CR tablet Take 650 mg by mouth every 8 (eight) hours as needed for pain.   Yes Historical Provider, MD  furosemide (LASIX) 40 MG tablet Take 40 mg by mouth daily.   Yes Historical Provider, MD  pantoprazole (PROTONIX) 40 MG tablet TAKE (1) TABLET BY MOUTH TWICE A DAY BEFORE MEALS. (BREAKFAST AND SUPPER) 04/26/15  Yes Carlis Stable, NP  pazopanib (VOTRIENT) 200 MG tablet Take 1 tablet (200 mg total) by mouth daily. 01/16/16  Yes [name], MD  topiramate (TOPAMAX) 25 MG tablet Take 25 mg by mouth 2 (two) times daily.   Yes Historical Provider, MD  ciprofloxacin (CIPRO) 500 MG tablet Take 1 tablet (500 mg total) by mouth 2 (two) times daily. 02/26/16   Merrily Pew, MD  diphenhydrAMINE (BENADRYL) 25 MG tablet Take 25 mg by mouth daily as needed for itching. Reported on 02/26/2016    Historical Provider, MD  docusate sodium (COLACE) 100 MG capsule Take 1 capsule (100 mg total) by mouth every 12 (twelve) hours. 02/26/16   Merrily Pew, MD  loperamide (IMODIUM) 2 MG capsule Take 1 capsule (2 mg total) by mouth as needed for diarrhea or loose stools. Patient not taking: Reported on 02/26/2016 12/09/15   Eugenie Filler, MD  metroNIDAZOLE (FLAGYL) 250 MG tablet Take 1 tablet (250 mg total) by mouth 3 (three) times daily. 02/26/16 03/07/16  Merrily Pew, MD  oxyCODONE-acetaminophen (PERCOCET/ROXICET) 5-325 MG tablet Take 1-2 tablets by mouth every 6 (six) hours as needed for severe pain. 02/26/16    Merrily Pew, MD  predniSONE (DELTASONE) 20 MG tablet Take 2 tablets (40 mg total) by mouth daily before breakfast. Take for 5 days, then stop. Patient not taking: Reported on 02/26/2016 12/09/15   Eugenie Filler, MD   BP 125/78 mmHg  Pulse 78  Temp(Src) 98.2 F (36.8 C) (Oral)  Resp 15  Ht 5\' 11"  (1.803 m)  Wt 170 lb (77.111 kg)  BMI 23.72 kg/m2  SpO2 99% Physical Exam  Constitutional: He is oriented to person, place, and time. He appears well-developed and well-nourished. No distress.  HENT:  Head: Normocephalic and atraumatic.  Eyes: Conjunctivae are normal.  Cardiovascular: Normal rate, regular rhythm and normal heart sounds.   Tachycardic in triage but not during Dr. Weston Brass PE  Pulmonary/Chest: Effort normal.  Abdominal: Soft. Bowel sounds are normal. He exhibits no  distension. There is no tenderness. There is no rebound.  Neurological: He is alert and oriented to person, place, and time.  Skin: Skin is warm and dry. No rash noted. There is erythema.  6cm x 3cm erythematous area located on right gluteal cleft with fluctuance in the middle  Psychiatric: He has a normal mood and affect.  Nursing note and vitals reviewed.   ED Course  .Marland KitchenIncision and Drainage Date/Time: 02/26/2016 3:24 PM Performed by: Merrily Pew Authorized by: Merrily Pew Consent: Verbal consent obtained. Risks and benefits: risks, benefits and alternatives were discussed Consent given by: patient Patient understanding: patient states understanding of the procedure being performed Patient identity confirmed: verbally with patient Type: abscess Body area: anogenital Location details: perianal Anesthesia: local infiltration Local anesthetic: lidocaine 1% with epinephrine Anesthetic total: 2 ml Scalpel size: 11 Incision type: single straight Complexity: simple Drainage: bloody Drainage amount: scant Wound treatment: wound left open Patient tolerance: Patient tolerated the procedure well with  no immediate complications   (including critical care time) DIAGNOSTIC STUDIES: Oxygen Saturation is 100% on RA, normal by my interpretation.    COORDINATION OF CARE: 10:29 AM-Discussed treatment plan which includes CBC panel, and BMP with pt at bedside and pt agreed to plan.   Labs Review Labs Reviewed  CBC WITH DIFFERENTIAL/PLATELET - Abnormal; Notable for the following:    RBC 2.78 (*)    Hemoglobin 8.8 (*)    HCT 26.5 (*)    RDW 17.7 (*)    Platelets 123 (*)    Lymphs Abs 0.3 (*)    All other components within normal limits  COMPREHENSIVE METABOLIC PANEL - Abnormal; Notable for the following:    Chloride 98 (*)    Glucose, Bld 150 (*)    BUN 26 (*)    Creatinine, Ser 1.87 (*)    Albumin 3.2 (*)    ALT 14 (*)    GFR calc non Af Amer 36 (*)    GFR calc Af Amer 42 (*)    All other components within normal limits    Imaging Review Ct Chest W Contrast  02/26/2016  CLINICAL DATA:  Worsening perirectal pain and erythema for the past day. The patient had a perirectal abscess incision and drainage on 11/13/2015. History of right nephrectomy for renal cell carcinoma and partial left lower lobe lung resection for a benign process. EXAM: CT CHEST, ABDOMEN, AND PELVIS WITH CONTRAST TECHNIQUE: Multidetector CT imaging of the chest, abdomen and pelvis was performed following the standard protocol during bolus administration of intravenous contrast. CONTRAST:  37mL ISOVUE-300 IOPAMIDOL (ISOVUE-300) INJECTION 61% COMPARISON:  Previous examinations, the most recent dated 12/07/2015. FINDINGS: CT CHEST FINDINGS Mediastinum/Lymph Nodes: 6 mm right lobe thyroid nodule and 4 mm left lobe thyroid nodule without significant change. No enlarged lymph nodes. Lungs/Pleura: The left hemidiaphragm remains mildly elevated. Stable left lower lobe postsurgical distortion. Minimal linear scarring in the right lower lobe without significant change. No lung nodules are seen at this time the previously demonstrated  3 mm nodular density along the left major fissure currently appears more linear and has the appearance of a small focal scar rather than a true lung nodule. Similar changes along the minor fissure are also less prominent. Musculoskeletal: Sternomanubrial degenerative changes. Minimal upper thoracic spine degenerative changes. CT ABDOMEN PELVIS FINDINGS Hepatobiliary: A small medial segment left lobe liver cyst is again demonstrated. Normal appearing gallbladder. Pancreas: The previously demonstrated 1.8 cm oval area of low density in the distal body/ proximal tail of the  pancreas is smaller and better defined today, currently measuring 1.0 cm in maximum diameter on image number 65 of series 2. Spleen: Within normal limits in size and appearance. Adrenals/Urinary Tract: A heterogeneous mass in the upper pole of the left kidney measures 5.8 cm in length on coronal image number 33, previously measuring 5.7 cm in corresponding diameter. In the axial plane, this measures 6.3 x 5.9 cm on image number 15 of series 8. This previously measured 6.0 x 5.3 cm in corresponding dimensions. This has slowly increased in size since 12/29/2012. Left renal cysts have not changed significantly. Surgically absent right kidney. No bladder or ureteral mass or calculi seen. No hydronephrosis. Normal appearing right adrenal gland. Focal nodular enlargement of the lateral limb of the left adrenal gland measuring 1.3 x 1.0 cm on image number 11 of series 8. This measured 1.4 x 1.4 cm on 12/29/2012. Stomach/Bowel: Large number of sigmoid and descending colon diverticula. No gastric or small bowel abnormalities. No evidence of appendicitis. Vascular/Lymphatic: No aneurysm or enlarged lymph nodes. Reproductive: Mildly enlarged prostate gland with coarse calcifications. Other: There is right perianal soft tissue stranding and a fluid and gas collection measure 3.1 x 1.3 cm on axial image number 129 of series 2. This measures 5.0 cm in length on  coronal image number 41. Musculoskeletal: Lumbar spine degenerative changes. These include marked disc space narrowing at the L5-S1 level with associated spur formation. No evidence of bony metastatic disease. IMPRESSION: 1. Right perianal fluid and gas collection compatible with an abscess measuring 5.0 x 3.1 x 1.3 cm. The majority of this is gas with minimal fluid. 2. Interval decrease in size of an oval area of low density in the pancreas at the junction of the body and tail. This currently measures 1.0 cm in maximum diameter and is nonspecific. 3. Mild, slowly progressive enlargement of the previously demonstrated left renal mass, currently measuring 6.3 x 5.9 x 5.8 cm. This remains concerning for a renal cell carcinoma. 4. 1.3 x 1.0 cm left adrenal gland nodule, decreased in size since 12/29/2012. The decrease in size is compatible with a benign process, most likely an adenoma. 5. Small bilateral thyroid nodules with little change since 12/29/2012, compatible with benign nodules. Electronically Signed   By: Claudie Revering M.D.   On: 02/26/2016 14:13   Ct Abdomen Pelvis W Contrast  02/26/2016  CLINICAL DATA:  Worsening perirectal pain and erythema for the past day. The patient had a perirectal abscess incision and drainage on 11/13/2015. History of right nephrectomy for renal cell carcinoma and partial left lower lobe lung resection for a benign process. EXAM: CT CHEST, ABDOMEN, AND PELVIS WITH CONTRAST TECHNIQUE: Multidetector CT imaging of the chest, abdomen and pelvis was performed following the standard protocol during bolus administration of intravenous contrast. CONTRAST:  41mL ISOVUE-300 IOPAMIDOL (ISOVUE-300) INJECTION 61% COMPARISON:  Previous examinations, the most recent dated 12/07/2015. FINDINGS: CT CHEST FINDINGS Mediastinum/Lymph Nodes: 6 mm right lobe thyroid nodule and 4 mm left lobe thyroid nodule without significant change. No enlarged lymph nodes. Lungs/Pleura: The left hemidiaphragm remains  mildly elevated. Stable left lower lobe postsurgical distortion. Minimal linear scarring in the right lower lobe without significant change. No lung nodules are seen at this time the previously demonstrated 3 mm nodular density along the left major fissure currently appears more linear and has the appearance of a small focal scar rather than a true lung nodule. Similar changes along the minor fissure are also less prominent. Musculoskeletal: Sternomanubrial degenerative changes. Minimal  upper thoracic spine degenerative changes. CT ABDOMEN PELVIS FINDINGS Hepatobiliary: A small medial segment left lobe liver cyst is again demonstrated. Normal appearing gallbladder. Pancreas: The previously demonstrated 1.8 cm oval area of low density in the distal body/ proximal tail of the pancreas is smaller and better defined today, currently measuring 1.0 cm in maximum diameter on image number 65 of series 2. Spleen: Within normal limits in size and appearance. Adrenals/Urinary Tract: A heterogeneous mass in the upper pole of the left kidney measures 5.8 cm in length on coronal image number 33, previously measuring 5.7 cm in corresponding diameter. In the axial plane, this measures 6.3 x 5.9 cm on image number 15 of series 8. This previously measured 6.0 x 5.3 cm in corresponding dimensions. This has slowly increased in size since 12/29/2012. Left renal cysts have not changed significantly. Surgically absent right kidney. No bladder or ureteral mass or calculi seen. No hydronephrosis. Normal appearing right adrenal gland. Focal nodular enlargement of the lateral limb of the left adrenal gland measuring 1.3 x 1.0 cm on image number 11 of series 8. This measured 1.4 x 1.4 cm on 12/29/2012. Stomach/Bowel: Large number of sigmoid and descending colon diverticula. No gastric or small bowel abnormalities. No evidence of appendicitis. Vascular/Lymphatic: No aneurysm or enlarged lymph nodes. Reproductive: Mildly enlarged prostate gland  with coarse calcifications. Other: There is right perianal soft tissue stranding and a fluid and gas collection measure 3.1 x 1.3 cm on axial image number 129 of series 2. This measures 5.0 cm in length on coronal image number 41. Musculoskeletal: Lumbar spine degenerative changes. These include marked disc space narrowing at the L5-S1 level with associated spur formation. No evidence of bony metastatic disease. IMPRESSION: 1. Right perianal fluid and gas collection compatible with an abscess measuring 5.0 x 3.1 x 1.3 cm. The majority of this is gas with minimal fluid. 2. Interval decrease in size of an oval area of low density in the pancreas at the junction of the body and tail. This currently measures 1.0 cm in maximum diameter and is nonspecific. 3. Mild, slowly progressive enlargement of the previously demonstrated left renal mass, currently measuring 6.3 x 5.9 x 5.8 cm. This remains concerning for a renal cell carcinoma. 4. 1.3 x 1.0 cm left adrenal gland nodule, decreased in size since 12/29/2012. The decrease in size is compatible with a benign process, most likely an adenoma. 5. Small bilateral thyroid nodules with little change since 12/29/2012, compatible with benign nodules. Electronically Signed   By: Claudie Revering M.D.   On: 02/26/2016 14:13   I have personally reviewed and evaluated these images and lab results as part of my medical decision-making.   EKG Interpretation None      MDM   Final diagnoses:  Perianal abscess    65 year old with perianal abscess on the right. I I&D as per the noted above with minimal fluid obtained. CT scan appears are mostly air and the area became smaller after incision so I think this was the right area. Patient also had decrease in pain afterwards. Will start on antibiotics, sitz baths and pain meds. Will return here or FU w/ [name] in 2-3 days if symptoms not improving.   New Prescriptions: New Prescriptions   CIPROFLOXACIN (CIPRO) 500 MG TABLET     Take 1 tablet (500 mg total) by mouth 2 (two) times daily.   DOCUSATE SODIUM (COLACE) 100 MG CAPSULE    Take 1 capsule (100 mg total) by mouth every 12 (twelve)  hours.   METRONIDAZOLE (FLAGYL) 250 MG TABLET    Take 1 tablet (250 mg total) by mouth 3 (three) times daily.     I have personally and contemperaneously reviewed labs and imaging and used in my decision making as above.   A medical screening exam was performed and I feel the patient has had an appropriate workup for their chief complaint at this time and likelihood of emergent condition existing is low and thus workup can continue on an outpatient basis.. Their vital signs are stable. They have been counseled on decision, discharge, follow up and which symptoms necessitate immediate return to the emergency department.  They verbally stated understanding and agreement with plan and discharged in stable condition.    I personally performed the services described in this documentation, which was scribed in my presence. The recorded information has been reviewed and is accurate.    Merrily Pew, MD 02/26/16 [phone]

## 2016-02-26 NOTE — ED Notes (Addendum)
Patient has red localized area noted around VI site in left forearm. Patient denies of pain or itching to site. Dr. Dayna Barker made aware. No new orders given.

## 2016-02-28 ENCOUNTER — Other Ambulatory Visit: Payer: BLUE CROSS/BLUE SHIELD

## 2016-02-28 ENCOUNTER — Ambulatory Visit (HOSPITAL_COMMUNITY): Admission: RE | Admit: 2016-02-28 | Payer: BLUE CROSS/BLUE SHIELD | Source: Ambulatory Visit

## 2016-03-02 ENCOUNTER — Other Ambulatory Visit: Payer: Self-pay | Admitting: *Deleted

## 2016-03-02 ENCOUNTER — Ambulatory Visit (HOSPITAL_BASED_OUTPATIENT_CLINIC_OR_DEPARTMENT_OTHER): Payer: BLUE CROSS/BLUE SHIELD | Admitting: Oncology

## 2016-03-02 ENCOUNTER — Telehealth: Payer: Self-pay | Admitting: Oncology

## 2016-03-02 ENCOUNTER — Other Ambulatory Visit: Payer: Self-pay | Admitting: Pharmacist

## 2016-03-02 VITALS — BP 165/6 | HR 99 | Temp 98.5°F | Resp 18 | Ht 71.0 in | Wt 165.6 lb

## 2016-03-02 DIAGNOSIS — Z85528 Personal history of other malignant neoplasm of kidney: Secondary | ICD-10-CM | POA: Diagnosis not present

## 2016-03-02 DIAGNOSIS — C649 Malignant neoplasm of unspecified kidney, except renal pelvis: Secondary | ICD-10-CM

## 2016-03-02 DIAGNOSIS — K611 Rectal abscess: Secondary | ICD-10-CM

## 2016-03-02 DIAGNOSIS — D509 Iron deficiency anemia, unspecified: Secondary | ICD-10-CM | POA: Diagnosis not present

## 2016-03-02 DIAGNOSIS — D696 Thrombocytopenia, unspecified: Secondary | ICD-10-CM | POA: Diagnosis not present

## 2016-03-02 MED ORDER — CABOZANTINIB S-MALATE 40 MG PO TABS: 40.0000 mg | ORAL_TABLET | Freq: Every day | ORAL | Status: DC

## 2016-03-02 NOTE — Progress Notes (Signed)
Hematology and Oncology Follow Up Visit  Jeffrey [name] 03/02/2016     Principle Diagnosis: 65 year old with the following issues:  1. T1b right kidney cancer diagnosed in 2010. He is S/P rdical nephrectomy on June 27, 2009. The pathological staging was T1b clear cell histology with Fuhrman grade 3/4.  2. Left Kidney mass  Measuring 4.5 x 2 7 x 3.6 irregular enhancing appears to be a new kidney cancer noted in 07/2012. 3. Pancreatic lesion. 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  Current therapy: Votrient  800 mg daily started in December 2013. Treatment held in 11/2012 due to increase LFTs. Votrient was resumed in April 2014 at 400 mg daily. His dose was reduced further to 200 mg daily starting in September 2016.  Interim History: Jeffrey Trujillo presents for a follow up visit. Since his last visit, he continues to have issues with perirectal abscess and required another drainage procedure on 02/26/2016. He is a recovering slowly at this point without any major issues. He continues to have pain with defecation and being in a sitting position but is recovering slowly. Some mild drainage is noted from his abscess. Extremity edema have also improved on diuretics.  He continues to take Votrient once a day without any sde effects at this time. He does not report any nausea or abdominal distention. He denied any diarrhea or excessive fatigue. His medication has been on hold since his recent abscess.  He is not having any fevers or chills. He did not report any headaches blurred vision double vision. Does not report any motor sensory neuropathy or alteration of mental status. He does not report any depression or anxiety. He does not report any frequency urgency or hesitancy. He does not report any other urinary symptoms. Remainder of his review of systems is unremarkable.  Medications: Unchanged by my review today. Current Outpatient Prescriptions  Medication Sig  Dispense Refill  . acetaminophen (TYLENOL) 650 MG CR tablet Take 650 mg by mouth every 8 (eight) hours as needed for pain.    . cabozantinib S-Malate (CABOMETYX) 40 MG TABS Take 40 mg by mouth daily. 30 tablet 0  . ciprofloxacin (CIPRO) 500 MG tablet Take 1 tablet (500 mg total) by mouth 2 (two) times daily. 20 tablet 0  . diphenhydrAMINE (BENADRYL) 25 MG tablet Take 25 mg by mouth daily as needed for itching. Reported on 02/26/2016    . docusate sodium (COLACE) 100 MG capsule Take 1 capsule (100 mg total) by mouth every 12 (twelve) hours. 60 capsule 0  . furosemide (LASIX) 40 MG tablet Take 40 mg by mouth daily.    . metroNIDAZOLE (FLAGYL) 250 MG tablet Take 1 tablet (250 mg total) by mouth 3 (three) times daily. 30 tablet 0  . oxyCODONE-acetaminophen (PERCOCET/ROXICET) 5-325 MG tablet Take 1-2 tablets by mouth every 6 (six) hours as needed for severe pain. 20 tablet 0  . pantoprazole (PROTONIX) 40 MG tablet TAKE (1) TABLET BY MOUTH TWICE A DAY BEFORE MEALS. (BREAKFAST AND SUPPER) 60 tablet 5  . pazopanib (VOTRIENT) 200 MG tablet Take 1 tablet (200 mg total) by mouth daily. 30 tablet 0  . topiramate (TOPAMAX) 25 MG tablet Take 25 mg by mouth 2 (two) times daily.     No current facility-administered medications for this visit.     Allergies:  Allergies  Allergen Reactions  . Nsaids Other (See Comments)    Patient only has one kidney.   . Meperidine Hcl Hives and Rash  Past Medical History, Surgical history, Social history, and Family History were reviewed and updated.   Physical Exam: Blood pressure 165/6, pulse 99, temperature 98.5 F (36.9 C), temperature source Oral, resp. rate 18, height 5\' 11"  (1.803 m), weight 165 lb 9.6 oz (75.116 kg), SpO2 100 %. ECOG: 1 General appearance: Alert, awake gentleman without distress. Head: Normocephalic, no obvious trauma or induration noted. No oral ulcers or lesions. Neck: no adenopathy, no masses Lymph nodes: Cervical, supraclavicular, and  axillary nodes normal. Heart:regular rate and rhythm, S1, S2.  Lung:chest clear, no wheezing, or dullness to percussion. Abdomen: soft, non-tender, without masses or organomegaly. No shifting dullness or ascites. EXT: bilateral edema noted. Neurological exam: no motor or sensory deficits noted. Skin: No rashes or lesions noted. no petechiae or ecchymosis.  Neurological: Intact no deficits. No motor or sensory deficits.  CBC    Component Value Date/Time   WBC 4.8 02/26/2016 1051   WBC 4.1 01/27/2016 1215   RBC 2.78* 02/26/2016 1051   RBC 2.93* 01/27/2016 1215   RBC 2.33* 12/08/2015 0956   HGB 8.8* 02/26/2016 1051   HGB 9.2* 01/27/2016 1215   HCT 26.5* 02/26/2016 1051   HCT 29.5* 01/27/2016 1215   PLT 123* 02/26/2016 1051   PLT 206 01/27/2016 1215   MCV 95.3 02/26/2016 1051   MCV 100.7* 01/27/2016 1215   MCH 31.7 02/26/2016 1051   MCH 31.4 01/27/2016 1215   MCHC 33.2 02/26/2016 1051   MCHC 31.2* 01/27/2016 1215   RDW 17.7* 02/26/2016 1051   RDW 18.7* 01/27/2016 1215   LYMPHSABS 0.3* 02/26/2016 1051   LYMPHSABS 0.8* 01/27/2016 1215   MONOABS 0.3 02/26/2016 1051   MONOABS 0.3 01/27/2016 1215   EOSABS 0.0 02/26/2016 1051   EOSABS 0.0 01/27/2016 1215   BASOSABS 0.0 02/26/2016 1051   BASOSABS 0.0 01/27/2016 1215    EXAM: CT CHEST, ABDOMEN, AND PELVIS WITH CONTRAST  TECHNIQUE: Multidetector CT imaging of the chest, abdomen and pelvis was performed following the standard protocol during bolus administration of intravenous contrast.  CONTRAST: 67mL ISOVUE-300 IOPAMIDOL (ISOVUE-300) INJECTION 61%  COMPARISON: Previous examinations, the most recent dated 12/07/2015.  FINDINGS: CT CHEST FINDINGS  Mediastinum/Lymph Nodes: 6 mm right lobe thyroid nodule and 4 mm left lobe thyroid nodule without significant change. No enlarged lymph nodes.  Lungs/Pleura: The left hemidiaphragm remains mildly elevated. Stable left lower lobe postsurgical distortion. Minimal  linear scarring in the right lower lobe without significant change. No lung nodules are seen at this time the previously demonstrated 3 mm nodular density along the left major fissure currently appears more linear and has the appearance of a small focal scar rather than a true lung nodule. Similar changes along the minor fissure are also less prominent.  Musculoskeletal: Sternomanubrial degenerative changes. Minimal upper thoracic spine degenerative changes.  CT ABDOMEN PELVIS FINDINGS  Hepatobiliary: A small medial segment left lobe liver cyst is again demonstrated. Normal appearing gallbladder.  Pancreas: The previously demonstrated 1.8 cm oval area of low density in the distal body/ proximal tail of the pancreas is smaller and better defined today, currently measuring 1.0 cm in maximum diameter on image number 65 of series 2.  Spleen: Within normal limits in size and appearance.  Adrenals/Urinary Tract: A heterogeneous mass in the upper pole of the left kidney measures 5.8 cm in length on coronal image number 33, previously measuring 5.7 cm in corresponding diameter. In the axial plane, this measures 6.3 x 5.9 cm on image number 15 of series 8. This previously  measured 6.0 x 5.3 cm in corresponding dimensions. This has slowly increased in size since 12/29/2012. Left renal cysts have not changed significantly. Surgically absent right kidney. No bladder or ureteral mass or calculi seen. No hydronephrosis. Normal appearing right adrenal gland. Focal nodular enlargement of the lateral limb of the left adrenal gland measuring 1.3 x 1.0 cm on image number 11 of series 8. This measured 1.4 x 1.4 cm on 12/29/2012.  Stomach/Bowel: Large number of sigmoid and descending colon diverticula. No gastric or small bowel abnormalities. No evidence of appendicitis.  Vascular/Lymphatic: No aneurysm or enlarged lymph nodes.  Reproductive: Mildly enlarged prostate gland with  coarse calcifications.  Other: There is right perianal soft tissue stranding and a fluid and gas collection measure 3.1 x 1.3 cm on axial image number 129 of series 2. This measures 5.0 cm in length on coronal image number 41.  Musculoskeletal: Lumbar spine degenerative changes. These include marked disc space narrowing at the L5-S1 level with associated spur formation. No evidence of bony metastatic disease.  IMPRESSION: 1. Right perianal fluid and gas collection compatible with an abscess measuring 5.0 x 3.1 x 1.3 cm. The majority of this is gas with minimal fluid. 2. Interval decrease in size of an oval area of low density in the pancreas at the junction of the body and tail. This currently measures 1.0 cm in maximum diameter and is nonspecific. 3. Mild, slowly progressive enlargement of the previously demonstrated left renal mass, currently measuring 6.3 x 5.9 x 5.8 cm. This remains concerning for a renal cell carcinoma. 4. 1.3 x 1.0 cm left adrenal gland nodule, decreased in size since 12/29/2012. The decrease in size is compatible with a benign process, most likely an adenoma. 5. Small bilateral thyroid nodules with little change since 12/29/2012, compatible with benign nodules.    Impression and Plan:   A 65 year old gentleman with the following issues:   1. A history of renal cell carcinoma. He had stage T1b diagnosed in October 2010 of the right kidney, status post right nephrectomy. He also has left kidney cancer and pancreatic metastasis. He was initially started on Votrient in December 2013. He had dose reduction on few occasions. The patient currently on Votrient 200 mg daily since September 2016 dose reduction.  CT scan obtained on 02/26/2016 was reviewed and showed slow progression of disease especially in his left renal mass. Based on these findings, we have recommended discontinuation of Votrient and use a different salvage therapy. His options include oral  targeted therapy versus immune therapy. After discussion today think he would be a reasonable candidate for Cabometyx. Risks and benefits of this medication were reviewed complications include diarrhea, hand-foot syndrome, fatigue, wound healing issues among others were reviewed. He is agreeable to proceed tentatively on 03/12/2016 after his perirectal abscess heals. I plan on repeating imaging studies in 3 months.  2. Pancreatic lesion. Biopsy proven to be malignant and likely represent renal cell cancer. It appears less prominent at this time.  3. Thrombocytopenia:  Likely related to Votrient. His platelet count is back to normal range.  4. Anemia.  Related to iron deficiency as well as renal insufficiency. We'll continue to monitor counts and supplement iron as needed.  5. Increase LFTs:  We will continue to monitor this moving forward. His most recent LFTs were within normal range.  6. Weight loss: Weight is stable at this time.  7. Perirectal abscess: appears to be healing slowly at this time.  8. Follow-up:  4 weeks for  evaluation.  [name] MD 03/02/2016

## 2016-03-02 NOTE — Telephone Encounter (Signed)
per pof ot sch pt appt-gave pt copy of avs °

## 2016-03-02 NOTE — Progress Notes (Signed)
RX for Cabometyx 40 mg daily requires PA and sent to specialty pharmacy.   Status:  PA approved from 03/02/16 to 03/01/17                [facility] - escribed to them and will follow-up on status.  Thank you  Henreitta Leber, PharmD [facility]

## 2016-03-07 ENCOUNTER — Telehealth: Payer: Self-pay | Admitting: *Deleted

## 2016-03-07 ENCOUNTER — Telehealth: Payer: Self-pay

## 2016-03-07 NOTE — Telephone Encounter (Signed)
Cabometx 40 mg RX was e-scribed to [facility] due to specialty RX item.  However [facility] cannot get this medication so [facility] transferred RX to [facility]. I contacted [facility] today and was informed benefits analysis was done and patients copay is $0.  Pharmacy is attempting to contact the patient to set up delivery.  We will contact the patient on Monday June 26th to verify patient receipt and counseling of patient.  Thank you  Henreitta Leber, PharmD [facility]

## 2016-03-07 NOTE — Telephone Encounter (Signed)
[facility] is now his preferred pharmacy. Thanks beth

## 2016-03-07 NOTE — Telephone Encounter (Signed)
TC from [facility] @ [place] regarding pt's prescription for Cabozantinib.  Original prescription had been fax'd to [facility], per [facility], this is a limited distribution drug and only carried by a few specialty pharmacies.  [facility] does not carry this. Therefore prescription is being filled by [facility].  Phone # [phone] Fax # [phone].  Pt's list of allergies and medication list given to this pharmacist

## 2016-03-08 ENCOUNTER — Encounter: Payer: Self-pay | Admitting: *Deleted

## 2016-03-12 NOTE — Progress Notes (Signed)
Attempted to call the patient no answer, left voice message to please contact us to confirm delivery of medication and if has any questions regarding proper use.  Will attempt contact at the end of the week to reconfirm delivery.    Thank you,  Henreitta Leber, PharmD [facility]

## 2016-03-19 ENCOUNTER — Encounter (HOSPITAL_COMMUNITY): Payer: Self-pay | Admitting: Emergency Medicine

## 2016-03-19 ENCOUNTER — Emergency Department (HOSPITAL_COMMUNITY)
Admission: EM | Admit: 2016-03-19 | Discharge: 2016-03-19 | Disposition: A | Discharge status: Home / Self Care | Payer: BLUE CROSS/BLUE SHIELD | Attending: Emergency Medicine | Admitting: Emergency Medicine

## 2016-03-19 DIAGNOSIS — K6289 Other specified diseases of anus and rectum: Secondary | ICD-10-CM

## 2016-03-19 DIAGNOSIS — Z87891 Personal history of nicotine dependence: Secondary | ICD-10-CM | POA: Insufficient documentation

## 2016-03-19 DIAGNOSIS — K625 Hemorrhage of anus and rectum: Secondary | ICD-10-CM | POA: Diagnosis present

## 2016-03-19 DIAGNOSIS — Z79899 Other long term (current) drug therapy: Secondary | ICD-10-CM | POA: Diagnosis not present

## 2016-03-19 LAB — I-STAT CHEM 8, ED
BUN: 17 mg/dL (ref 6–20)
Calcium, Ion: 1.1 mmol/L — ABNORMAL LOW (ref 1.12–1.23)
Chloride: 97 mmol/L — ABNORMAL LOW (ref 101–111)
Creatinine, Ser: 1.8 mg/dL — ABNORMAL HIGH (ref 0.61–1.24)
Glucose, Bld: 99 mg/dL (ref 65–99)
HEMATOCRIT: 24 % — AB (ref 39.0–52.0)
HEMOGLOBIN: 8.2 g/dL — AB (ref 13.0–17.0)
POTASSIUM: 4 mmol/L (ref 3.5–5.1)
Sodium: 135 mmol/L (ref 135–145)
TCO2: 24 mmol/L (ref 0–100)

## 2016-03-19 LAB — CBC WITH DIFFERENTIAL/PLATELET
BASOS ABS: 0 10*3/uL (ref 0.0–0.1)
BASOS PCT: 0 %
EOS ABS: 0 10*3/uL (ref 0.0–0.7)
EOS PCT: 1 %
HCT: 26.7 % — ABNORMAL LOW (ref 39.0–52.0)
Hemoglobin: 8.7 g/dL — ABNORMAL LOW (ref 13.0–17.0)
Lymphocytes Relative: 6 %
Lymphs Abs: 0.3 10*3/uL — ABNORMAL LOW (ref 0.7–4.0)
MCH: 32.7 pg (ref 26.0–34.0)
MCHC: 32.6 g/dL (ref 30.0–36.0)
MCV: 100.4 fL — ABNORMAL HIGH (ref 78.0–100.0)
MONO ABS: 0.3 10*3/uL (ref 0.1–1.0)
Monocytes Relative: 5 %
NEUTROS ABS: 4.7 10*3/uL (ref 1.7–7.7)
Neutrophils Relative %: 89 %
PLATELETS: 64 10*3/uL — AB (ref 150–400)
RBC: 2.66 MIL/uL — ABNORMAL LOW (ref 4.22–5.81)
RDW: 18.4 % — AB (ref 11.5–15.5)
WBC: 5.4 10*3/uL (ref 4.0–10.5)

## 2016-03-19 MED ORDER — HYDROMORPHONE HCL 1 MG/ML IJ SOLN
INTRAMUSCULAR | Status: AC
  Administered 2016-03-19: 1 mg via INTRAVENOUS
  Filled 2016-03-19: qty 1

## 2016-03-19 MED ORDER — HYDROMORPHONE HCL 1 MG/ML IJ SOLN
1.0000 mg | Freq: Once | INTRAMUSCULAR | Status: AC
  Administered 2016-03-19: 1 mg via INTRAVENOUS

## 2016-03-19 MED ORDER — SODIUM CHLORIDE 0.9 % IV BOLUS (SEPSIS)
250.0000 mL | Freq: Once | INTRAVENOUS | Status: AC
  Administered 2016-03-19: 250 mL via INTRAVENOUS

## 2016-03-19 MED ORDER — HYDROCODONE-ACETAMINOPHEN 5-325 MG PO TABS: 1.0000 | ORAL_TABLET | Freq: Four times a day (QID) | ORAL | Status: DC | PRN

## 2016-03-19 NOTE — Consult Note (Addendum)
SURGICAL CONSULTATION NOTE (initial)  HISTORY OF PRESENT ILLNESS (HPI):  65 y.o. male recently started on oral chemotherapy 6/26 for metastatic renal cell CA and status post operative incision and drainage for ischiorectal abscess (10/2015) and ER drainage of peri-anal abscess (6/11) with subsequent outpatient follow-up with [name] the following week, presented today with recurrence of peri-anal pain similar to that for which he presented 6/11, worst with coughing. In this context, patient reports he felt something "pop" this morning, after which he noted blood in the toilet. It was due to his concern for bleeding from his prior surgical site that he presented to the ER today. Patient denies any fever/chills, change in bowel function, CP, or SOB.  PAST MEDICAL HISTORY ([facility]):  Past Medical History  Diagnosis Date  . Gout   . DJD (degenerative joint disease) of cervical spine   . Diverticulosis of colon     by colonoscopy 11/02/2009  . Barrett's esophagus     by EGD 11/02/2009  . Anemia   . Cancer [facility])     "was told has cancer on other kidney and spot on pancreas"  . 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 GERD (gastroesophageal reflux disease)      PAST SURGICAL HISTORY ([place]):  Past Surgical History  Procedure Laterality Date  . Nephrectomy      right due to cancer  . Lung removal, partial      left lower lobe, benign  . Eus  08/07/2012    Procedure: UPPER ENDOSCOPIC ULTRASOUND (EUS) LINEAR;  Surgeon: [name], MD;  Location: [facility];  Service: Endoscopy;  Laterality: N/A;  . Inguinal hernia repair    . Hernia repair    . Inguinal hernia repair      Right  . Eus  08/21/2012    Procedure: UPPER ENDOSCOPIC ULTRASOUND (EUS) LINEAR;  Surgeon: [name], MD;  Location: [facility];  Service: Endoscopy;  Laterality: N/A;  . Colonoscopy  Feb 2011    [name]: single anal papilla, pan-colonic diverticula  . Esophagogastroduodenoscopy  Feb 2011   [name]: salmon-colored epithelium consistent with Barrett's, nodular antral erosions, small hiatal hernia, negative H.pylori  . Colonoscopy  09/25/2012    RMR: Colonic diverticulosis. Suspect diverticular bleeding-hepatic flexure tic-sealed  . Esophagogastroduodenoscopy  09/25/2012    [id number] esophagus. Small hiatal hernia. Antral erosions of doubtful clinical significance, no biopsies  . Esophagogastroduodenoscopy N/A 03/11/2013    SLF: Barrett's esophagus/Single ulcer in the gastric antrum-PROBABLE SOURCE FOR BLACK STOOL/ Chronic gastritis in the gastric antrum  . Esophagogastroduodenoscopy N/A 10/11/2014    Dr. [name] barrett's s/p biopsy. Hiatal hernia. Biopsy with intestinal metaplasia, glandular atypia indefinite for low grade dysplasia noted as well  . Esophagogastroduodenoscopy N/A 01/19/2015    [name]: Barrett's no dysplasia  . Incision and drainage abscess N/A 11/13/2015    Procedure: INCISION AND DRAINAGE ABSCESS;  Surgeon: Aviva Signs, MD;  Location: [facility];  Service: General;  Laterality: N/A;     MEDICATIONS:  Prior to Admission medications   Medication Sig Start Date End Date Taking? Authorizing Provider  acetaminophen (TYLENOL) 650 MG CR tablet Take 650 mg by mouth every 8 (eight) hours as needed for pain.   Yes Historical Provider, MD  cabozantinib S-Malate (CABOMETYX) 40 MG TABS Take 40 mg by mouth daily. 03/02/16  Yes [name], MD  diphenhydrAMINE (BENADRYL) 25 MG tablet Take 25 mg by mouth daily as needed for itching. Reported on  02/26/2016   Yes Historical Provider, MD  furosemide (LASIX) 40 MG tablet Take 40 mg by mouth daily.   Yes Historical Provider, MD  pantoprazole (PROTONIX) 40 MG tablet TAKE (1) TABLET BY MOUTH TWICE A DAY BEFORE MEALS. (BREAKFAST AND SUPPER) 04/26/15  Yes Carlis Stable, NP  topiramate (TOPAMAX) 25 MG tablet Take 25 mg by mouth 2 (two) times daily.   Yes Historical Provider, MD  ciprofloxacin (CIPRO) 500 MG tablet Take 1 tablet (500  mg total) by mouth 2 (two) times daily. Patient not taking: Reported on 03/19/2016 02/26/16   Merrily Pew, MD  docusate sodium (COLACE) 100 MG capsule Take 1 capsule (100 mg total) by mouth every 12 (twelve) hours. Patient not taking: Reported on 03/19/2016 02/26/16   Merrily Pew, MD  oxyCODONE-acetaminophen (PERCOCET/ROXICET) 5-325 MG tablet Take 1-2 tablets by mouth every 6 (six) hours as needed for severe pain. Patient not taking: Reported on 03/19/2016 02/26/16   Merrily Pew, MD     ALLERGIES:  Allergies  Allergen Reactions  . Amoxicillin Nausea And Vomiting  . Nsaids Other (See Comments)    Patient only has one kidney.   Marland Kitchen Zofran [Ondansetron Hcl] Hives  . Demerol [Meperidine] Rash  . Meperidine Hcl Hives and Rash     SOCIAL HISTORY:  Social History   Social History  . Marital Status: Married    Spouse Name: N/A  . Number of Children: N/A  . Years of Education: N/A   Occupational History  .      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 Smoking status: Former Smoker -- 0.25 packs/day for 4 years    Types: Cigars  . Smokeless tobacco: Never Used     Comment: occasional cigar  . Alcohol Use: 1.2 - 1.8 oz/week    2-3 Cans of beer per week     Comment: vodka and budweiser, sometimes daily, depends on notion. couple drinks. 2-3 beers on a weekend. pt says no etoh in 3 months  . Drug Use: No  . Sexual Activity: Yes   Other Topics Concern  . Not on file   Social History Narrative    The patient currently resides (home / rehab facility / nursing home): Home  The patient normally is (ambulatory / bedbound): Ambulatory   FAMILY HISTORY:  Family History  Problem Relation Age of Onset  . Lung cancer Brother   . Prostate cancer Brother   . Colon cancer Neg Hx   . Lumbar disc disease Brother   . Prostate cancer Brother     REVIEW OF SYSTEMS:  Constitutional: denies weight loss, fever, chills, or sweats  Eyes: denies any other vision changes, history of  eye injury  ENT: denies sore throat, hearing problems  Respiratory: denies shortness of breath, wheezing  Cardiovascular: denies chest pain, palpitations  Gastrointestinal: anorectal pain as per HPI, denies abdominal pain or N/V Musculoskeletal: denies any other joint pains or cramps  Skin: denies any other rashes or skin discolorations  Neurological: denies any other headache, dizziness, weakness  Psychiatric: denies any other depression, anxiety   All other review of systems were negative   VITAL SIGNS:  Temp:  [98.5 F (36.9 C)] 98.5 F (36.9 C) (07/03 1557) Pulse Rate:  [78-85] 78 (07/03 1730) Resp:  [20] 20 (07/03 1557) BP: (122-156)/(63-89) 156/89 mmHg (07/03 1730) SpO2:  [99 %-100 %] 100 % (07/03 1730) Weight:  [74.39 kg (164 lb)] 74.39 kg (164 lb) (07/03 1557)  Height: 5\' 4"  (162.6 cm) Weight: 74.39 kg (164 lb) BMI (Calculated): 28.2   INTAKE/OUTPUT:  This shift:    Last 2 shifts: @IOLAST2SHIFTS @   PHYSICAL EXAM:  Constitutional:  -- Normal body habitus  -- Awake, alert, and oriented x3  Eyes:  -- Pupils equally round and reactive to light  -- No scleral icterus  Ear, nose, and throat:  -- No jugular venous distension  Pulmonary:  -- No crackles  -- Equal breath sounds bilaterally  Cardiovascular:  -- S1, S2 present  -- No pericardial rubs Abdomen:  -- Soft, nontender, nondistended, no guarding/rebound  -- No abdominal masses appreciated, pulsatile or otherwise Rectal: -- Purulent dark maroon fluid on bedsheets with minimal additional fluid able to be expressed from Right peri-anal sinus tract extending into a large non-loculated cavity, small to moderate area of induration at the drainage site Musculoskeletal / Integumentary:  -- Wounds or skin discoloration: None except drained peri-anal abscess as described above  -- Extremities: B/L UE and LE FROM, hands and feet warm, no edema  Neurologic:  -- Motor function: intact and symmetric -- Sensation: intact  and symmetric   Labs:  CBC:  Lab Results  Component Value Date   WBC 5.4 03/19/2016   WBC 4.1 01/27/2016   RBC 2.66* 03/19/2016   RBC 2.93* 01/27/2016   RBC 2.33* 12/08/2015     Imaging studies:  No new pertinent imaging studies, though CT abdomen and pelvis 2/25, 3/22, and 6/11 reviewed  Assessment/Plan:  65 y.o. male with recurrent drained Right peri-anal abscess, complicated by pertinent comorbidities including chemotherapy for metastatic renal cell CA with pancreatic metastases.   - appropriate drainage of peri-anal abscess cavity confirmed   - Right peri-anal abscess cavity packed, will need to be changed daily   - follow-up outpatient with [name], return to ED if unable or symptoms worsen  - antibiotics as per ED, please call if any questions  All of the above findings and recommendations were discussed with the patient, his family, and ED staff, and all of patient's and his family's questions were answered to their expressed satisfaction.  Thank you for the opportunity to participate in this patient's care.  -- Marilynne Drivers Rosana Hoes, MD, [place]: [place] [facility] Office: [phone]

## 2016-03-19 NOTE — Discharge Instructions (Signed)
Follow-up back here tomorrow morning for dressing change. Then follow-up with [name] later this week

## 2016-03-19 NOTE — ED Notes (Signed)
MD packing rectal abscess

## 2016-03-19 NOTE — ED Notes (Signed)
Pt has abscess near rectum, lanced 2 weeks ago, pt states today excessive bleeding, and pain.

## 2016-03-19 NOTE — ED Provider Notes (Signed)
CSN: [id number]     Arrival date & time 03/19/16  1552 History   First MD Initiated Contact with Patient 03/19/16 1559     Chief Complaint  Patient presents with  . Laceration    rectum     (Consider location/radiation/quality/duration/timing/severity/associated sxs/prior Treatment) Patient is a 65 y.o. male presenting with hematochezia. The history is provided by the patient (Patient with rectal pain and bleeding. He had a perirectal abscess drained couple weeks ago).  Rectal Bleeding Quality:  Maroon Amount:  Moderate Timing:  Intermittent Progression:  Waxing and waning Chronicity:  New Context: not anal fissures   Associated symptoms: no abdominal pain     Past Medical History  Diagnosis Date  . Gout   . DJD (degenerative joint disease) of cervical spine   . Diverticulosis of colon     by colonoscopy 11/02/2009  . Barrett's esophagus     by EGD 11/02/2009  . Anemia   . Cancer [facility])     "was told has cancer on other kidney and spot on pancreas"  . 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 GERD (gastroesophageal reflux disease)    Past Surgical History  Procedure Laterality Date  . Nephrectomy      right due to cancer  . Lung removal, partial      left lower lobe, benign  . Eus  08/07/2012    Procedure: UPPER ENDOSCOPIC ULTRASOUND (EUS) LINEAR;  Surgeon: [name], MD;  Location: [facility];  Service: Endoscopy;  Laterality: N/A;  . Inguinal hernia repair    . Hernia repair    . Inguinal hernia repair      Right  . Eus  08/21/2012    Procedure: UPPER ENDOSCOPIC ULTRASOUND (EUS) LINEAR;  Surgeon: [name], MD;  Location: [facility];  Service: Endoscopy;  Laterality: N/A;  . Colonoscopy  Feb 2011    [name]: single anal papilla, pan-colonic diverticula  . Esophagogastroduodenoscopy  Feb 2011    [name]: salmon-colored epithelium consistent with Barrett's, nodular antral erosions, small hiatal hernia, negative H.pylori  . Colonoscopy   09/25/2012    RMR: Colonic diverticulosis. Suspect diverticular bleeding-hepatic flexure tic-sealed  . Esophagogastroduodenoscopy  09/25/2012    [id number] esophagus. Small hiatal hernia. Antral erosions of doubtful clinical significance, no biopsies  . Esophagogastroduodenoscopy N/A 03/11/2013    SLF: Barrett's esophagus/Single ulcer in the gastric antrum-PROBABLE SOURCE FOR BLACK STOOL/ Chronic gastritis in the gastric antrum  . Esophagogastroduodenoscopy N/A 10/11/2014    Dr. [name] barrett's s/p biopsy. Hiatal hernia. Biopsy with intestinal metaplasia, glandular atypia indefinite for low grade dysplasia noted as well  . Esophagogastroduodenoscopy N/A 01/19/2015    [name]: Barrett's no dysplasia  . Incision and drainage abscess N/A 11/13/2015    Procedure: INCISION AND DRAINAGE ABSCESS;  Surgeon: Aviva Signs, MD;  Location: [facility];  Service: General;  Laterality: N/A;   Family History  Problem Relation Age of Onset  . Lung cancer Brother   . Prostate cancer Brother   . Colon cancer Neg Hx   . Lumbar disc disease Brother   . Prostate cancer Brother    Social History  Substance Use Topics  . Smoking status: Former Smoker -- 0.25 packs/day for 4 years    Types: Cigars  . Smokeless tobacco: Never Used     Comment: occasional cigar  . Alcohol Use: 1.2 - 1.8 oz/week    2-3 Cans of beer per week     Comment:  vodka and budweiser, sometimes daily, depends on notion. couple drinks. 2-3 beers on a weekend. pt says no etoh in 3 months    Review of Systems  Constitutional: Negative for appetite change and fatigue.  HENT: Negative for congestion, ear discharge and sinus pressure.   Eyes: Negative for discharge.  Respiratory: Negative for cough.   Cardiovascular: Negative for chest pain.  Gastrointestinal: Positive for hematochezia. Negative for abdominal pain and diarrhea.       Rectal pain and bleeding  Genitourinary: Negative for frequency and hematuria.  Musculoskeletal:  Negative for back pain.  Skin: Negative for rash.  Neurological: Negative for seizures and headaches.  Psychiatric/Behavioral: Negative for hallucinations.      Allergies  Amoxicillin; Nsaids; Zofran; Demerol; and Meperidine hcl  Home Medications   Prior to Admission medications   Medication Sig Start Date End Date Taking? Authorizing Provider  acetaminophen (TYLENOL) 650 MG CR tablet Take 650 mg by mouth every 8 (eight) hours as needed for pain.   Yes Historical Provider, MD  cabozantinib S-Malate (CABOMETYX) 40 MG TABS Take 40 mg by mouth daily. 03/02/16  Yes [name], MD  diphenhydrAMINE (BENADRYL) 25 MG tablet Take 25 mg by mouth daily as needed for itching. Reported on 02/26/2016   Yes Historical Provider, MD  furosemide (LASIX) 40 MG tablet Take 40 mg by mouth daily.   Yes Historical Provider, MD  pantoprazole (PROTONIX) 40 MG tablet TAKE (1) TABLET BY MOUTH TWICE A DAY BEFORE MEALS. (BREAKFAST AND SUPPER) 04/26/15  Yes Carlis Stable, NP  topiramate (TOPAMAX) 25 MG tablet Take 25 mg by mouth 2 (two) times daily.   Yes Historical Provider, MD  ciprofloxacin (CIPRO) 500 MG tablet Take 1 tablet (500 mg total) by mouth 2 (two) times daily. Patient not taking: Reported on 03/19/2016 02/26/16   Merrily Pew, MD  docusate sodium (COLACE) 100 MG capsule Take 1 capsule (100 mg total) by mouth every 12 (twelve) hours. Patient not taking: Reported on 03/19/2016 02/26/16   Merrily Pew, MD  HYDROcodone-acetaminophen (NORCO/VICODIN) 5-325 MG tablet Take 1-2 tablets by mouth every 6 (six) hours as needed. 03/19/16   Milton Ferguson, MD  oxyCODONE-acetaminophen (PERCOCET/ROXICET) 5-325 MG tablet Take 1-2 tablets by mouth every 6 (six) hours as needed for severe pain. Patient not taking: Reported on 03/19/2016 02/26/16   Merrily Pew, MD   BP 151/86 mmHg  Pulse 80  Temp(Src) 98.5 F (36.9 C) (Oral)  Resp 20  Ht 5\' 4"  (1.626 m)  Wt 164 lb (74.39 kg)  BMI 28.14 kg/m2  SpO2 100% Physical Exam   Constitutional: He is oriented to person, place, and time. He appears well-developed.  HENT:  Head: Normocephalic.  Eyes: Conjunctivae and EOM are normal. No scleral icterus.  Neck: Neck supple. No thyromegaly present.  Cardiovascular: Normal rate and regular rhythm.  Exam reveals no gallop and no friction rub.   No murmur heard. Pulmonary/Chest: No stridor. He has no wheezes. He has no rales. He exhibits no tenderness.  Abdominal: He exhibits no distension. There is no tenderness. There is no rebound.  Genitourinary:  Patient has what appears to be a perirectal abscess that is tender and draining some blood.  Musculoskeletal: Normal range of motion. He exhibits no edema.  Lymphadenopathy:    He has no cervical adenopathy.  Neurological: He is oriented to person, place, and time. He exhibits normal muscle tone. Coordination normal.  Skin: No rash noted. No erythema.  Psychiatric: He has a normal mood and affect. His  behavior is normal.    ED Course  Procedures (including critical care time) Labs Review Labs Reviewed  CBC WITH DIFFERENTIAL/PLATELET - Abnormal; Notable for the following:    RBC 2.66 (*)    Hemoglobin 8.7 (*)    HCT 26.7 (*)    MCV 100.4 (*)    RDW 18.4 (*)    Platelets 64 (*)    Lymphs Abs 0.3 (*)    All other components within normal limits  I-STAT CHEM 8, ED - Abnormal; Notable for the following:    Chloride 97 (*)    Creatinine, Ser 1.80 (*)    Calcium, Ion 1.10 (*)    Hemoglobin 8.2 (*)    HCT 24.0 (*)    All other components within normal limits    Imaging Review No results found. I have personally reviewed and evaluated these images and lab results as part of my medical decision-making.   EKG Interpretation None      MDM   Final diagnoses:  Rectal pain   Perirectal abscess patient was seen by surgeon in emergency department. The abscess/hematoma was drained by the surgeon.  Packing was placed. Patient was sent home with pain medicine and  will follow-up tomorrow for dressing change. He will then follow-up with general surgery this week   Milton Ferguson, MD 03/19/16 [phone]

## 2016-03-19 NOTE — ED Notes (Signed)
MD at the bedside  

## 2016-03-19 NOTE — ED Notes (Signed)
Patient given discharge instruction, verbalized understand. IV removed, band aid applied. Patient ambulatory out of the department.  

## 2016-03-19 NOTE — ED Notes (Signed)
Dr Rosana Hoes at the bedside, supplies provided

## 2016-03-20 ENCOUNTER — Emergency Department (HOSPITAL_COMMUNITY)
Admission: EM | Admit: 2016-03-20 | Discharge: 2016-03-20 | Disposition: A | Discharge status: Home / Self Care | Payer: BLUE CROSS/BLUE SHIELD | Attending: Emergency Medicine | Admitting: Emergency Medicine

## 2016-03-20 ENCOUNTER — Encounter (HOSPITAL_COMMUNITY): Payer: Self-pay | Admitting: *Deleted

## 2016-03-20 DIAGNOSIS — Z4801 Encounter for change or removal of surgical wound dressing: Secondary | ICD-10-CM | POA: Insufficient documentation

## 2016-03-20 DIAGNOSIS — Z4800 Encounter for change or removal of nonsurgical wound dressing: Secondary | ICD-10-CM

## 2016-03-20 DIAGNOSIS — Z8553 Personal history of malignant neoplasm of renal pelvis: Secondary | ICD-10-CM | POA: Diagnosis not present

## 2016-03-20 DIAGNOSIS — Z79899 Other long term (current) drug therapy: Secondary | ICD-10-CM | POA: Insufficient documentation

## 2016-03-20 MED ORDER — PROMETHAZINE HCL 12.5 MG PO TABS
12.5000 mg | ORAL_TABLET | Freq: Once | ORAL | Status: AC
  Administered 2016-03-20: 12.5 mg via ORAL
  Filled 2016-03-20: qty 1

## 2016-03-20 MED ORDER — OXYCODONE-ACETAMINOPHEN 5-325 MG PO TABS: 1.0000 | ORAL_TABLET | Freq: Four times a day (QID) | ORAL | Status: DC | PRN

## 2016-03-20 MED ORDER — HYDROMORPHONE HCL 1 MG/ML IJ SOLN
1.0000 mg | Freq: Once | INTRAMUSCULAR | Status: AC
  Administered 2016-03-20: 1 mg via INTRAMUSCULAR
  Filled 2016-03-20: qty 1

## 2016-03-20 NOTE — ED Notes (Signed)
Jeffrey Trujillo at bedside. Area was re-packed and pt was given a dressing to overlay on area.

## 2016-03-20 NOTE — ED Notes (Signed)
Pt comes in for re-packing of his rectal wound. Pt was seen here yesterday.

## 2016-03-20 NOTE — ED Provider Notes (Signed)
CSN: [id number]     Arrival date & time 03/20/16  0850 History   First MD Initiated Contact with Patient 03/20/16 [phone]     Chief Complaint  Patient presents with  . Wound Check     (Consider location/radiation/quality/duration/timing/severity/associated sxs/prior Treatment) HPI Comments: Patient is a 65 year old male who presents to the emergency department for packing removal and wound check.  Patient has a history of perirectal abscess on. Patient had surgical intervention by Dr. Rosana Hoes on yesterday. He was told to come to the emergency department today to have the wound repacked and dressing changed. The patient denies any high fever on yesterday. He states he is having quite a bit of pain. He states that the hydrocodone that he was giving is not helping his pain. He has noted some mild to moderate drainage present. No other new changes or problems reported.   Patient is a 65 y.o. male presenting with wound check. The history is provided by the patient.  Wound Check Associated symptoms include arthralgias and myalgias.    Past Medical History  Diagnosis Date  . Gout   . DJD (degenerative joint disease) of cervical spine   . Diverticulosis of colon     by colonoscopy 11/02/2009  . Barrett's esophagus     by EGD 11/02/2009  . Anemia   . Cancer [facility])     "was told has cancer on other kidney and spot on pancreas"  . 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 GERD (gastroesophageal reflux disease)    Past Surgical History  Procedure Laterality Date  . Nephrectomy      right due to cancer  . Lung removal, partial      left lower lobe, benign  . Eus  08/07/2012    Procedure: UPPER ENDOSCOPIC ULTRASOUND (EUS) LINEAR;  Surgeon: [name], MD;  Location: [facility];  Service: Endoscopy;  Laterality: N/A;  . Inguinal hernia repair    . Hernia repair    . Inguinal hernia repair      Right  . Eus  08/21/2012    Procedure: UPPER ENDOSCOPIC ULTRASOUND (EUS) LINEAR;   Surgeon: [name], MD;  Location: [facility];  Service: Endoscopy;  Laterality: N/A;  . Colonoscopy  Feb 2011    [name]: single anal papilla, pan-colonic diverticula  . Esophagogastroduodenoscopy  Feb 2011    [name]: salmon-colored epithelium consistent with Barrett's, nodular antral erosions, small hiatal hernia, negative H.pylori  . Colonoscopy  09/25/2012    RMR: Colonic diverticulosis. Suspect diverticular bleeding-hepatic flexure tic-sealed  . Esophagogastroduodenoscopy  09/25/2012    [id number] esophagus. Small hiatal hernia. Antral erosions of doubtful clinical significance, no biopsies  . Esophagogastroduodenoscopy N/A 03/11/2013    SLF: Barrett's esophagus/Single ulcer in the gastric antrum-PROBABLE SOURCE FOR BLACK STOOL/ Chronic gastritis in the gastric antrum  . Esophagogastroduodenoscopy N/A 10/11/2014    Dr. [name] barrett's s/p biopsy. Hiatal hernia. Biopsy with intestinal metaplasia, glandular atypia indefinite for low grade dysplasia noted as well  . Esophagogastroduodenoscopy N/A 01/19/2015    [name]: Barrett's no dysplasia  . Incision and drainage abscess N/A 11/13/2015    Procedure: INCISION AND DRAINAGE ABSCESS;  Surgeon: Aviva Signs, MD;  Location: [facility];  Service: General;  Laterality: N/A;   Family History  Problem Relation Age of Onset  . Lung cancer Brother   . Prostate cancer Brother   . Colon cancer Neg Hx   . Lumbar disc disease Brother   .  Prostate cancer Brother    Social History  Substance Use Topics  . Smoking status: Former Smoker -- 0.25 packs/day for 4 years    Types: Cigars  . Smokeless tobacco: Never Used     Comment: occasional cigar  . Alcohol Use: 1.2 - 1.8 oz/week    2-3 Cans of beer per week     Comment: vodka and budweiser, sometimes daily, depends on notion. couple drinks. 2-3 beers on a weekend. pt says no etoh in 3 months    Review of Systems  Musculoskeletal: Positive for myalgias and arthralgias.  Skin:  Positive for wound.  All other systems reviewed and are negative.     Allergies  Amoxicillin; Nsaids; Zofran; Demerol; and Meperidine hcl  Home Medications   Prior to Admission medications   Medication Sig Start Date End Date Taking? Authorizing Provider  acetaminophen (TYLENOL) 650 MG CR tablet Take 650 mg by mouth every 8 (eight) hours as needed for pain.   Yes Historical Provider, MD  cabozantinib S-Malate (CABOMETYX) 40 MG TABS Take 40 mg by mouth daily. 03/02/16  Yes [name], MD  diphenhydrAMINE (BENADRYL) 25 MG tablet Take 25 mg by mouth daily as needed for itching. Reported on 02/26/2016   Yes Historical Provider, MD  HYDROcodone-acetaminophen (NORCO/VICODIN) 5-325 MG tablet Take 1-2 tablets by mouth every 6 (six) hours as needed. 03/19/16  Yes Milton Ferguson, MD  pantoprazole (PROTONIX) 40 MG tablet TAKE (1) TABLET BY MOUTH TWICE A DAY BEFORE MEALS. (BREAKFAST AND SUPPER) 04/26/15  Yes Carlis Stable, NP  topiramate (TOPAMAX) 25 MG tablet Take 25 mg by mouth 2 (two) times daily.   Yes Historical Provider, MD  ciprofloxacin (CIPRO) 500 MG tablet Take 1 tablet (500 mg total) by mouth 2 (two) times daily. Patient not taking: Reported on 03/19/2016 02/26/16   Merrily Pew, MD  docusate sodium (COLACE) 100 MG capsule Take 1 capsule (100 mg total) by mouth every 12 (twelve) hours. Patient not taking: Reported on 03/19/2016 02/26/16   Merrily Pew, MD  oxyCODONE-acetaminophen (PERCOCET/ROXICET) 5-325 MG tablet Take 1-2 tablets by mouth every 6 (six) hours as needed for severe pain. Patient not taking: Reported on 03/19/2016 02/26/16   Merrily Pew, MD   BP 153/73 mmHg  Pulse 59  Temp(Src) 97.7 F (36.5 C) (Oral)  Resp 16  Wt 74.844 kg  SpO2 100% Physical Exam  Genitourinary:  There is an incision and drainage area noted of the right buttocks near the anus that has had surgical packing. The packing remains in place. There is mild to moderate drainage present. There is no red streaking noted at  the abscess area.    ED Course  Wound packing Date/Time: 03/20/2016 10:48 AM Performed by: Lily Kocher Authorized by: Lily Kocher Consent: Verbal consent obtained. Risks and benefits: risks, benefits and alternatives were discussed Consent given by: patient Patient understanding: patient states understanding of the procedure being performed Patient identity confirmed: arm band Time out: Immediately prior to procedure a "time out" was called to verify the correct patient, procedure, equipment, support staff and site/side marked as required. Preparation: Patient was prepped and draped in the usual sterile fashion. Local anesthesia used: no Patient sedated: no Patient tolerance: Patient tolerated the procedure well with no immediate complications Comments: Wound packed with 1/2 iodoform gause. Dressing applied by me.   (including critical care time) Labs Review Labs Reviewed - No data to display  Imaging Review No results found. I have personally reviewed and evaluated these images and  lab results as part of my medical decision-making.   EKG Interpretation None      MDM  Patient had incision and drainage by the surgeon on yesterday. Instructions were given for the patient to return to the emergency department today for evaluation of wound, and for repacking and bandaging. On the packing was removed and new packing applied. Dressing applied. Vital signs well within normal limits.  The patient is instructed to see [name] on Friday. He is instructed if he cannot see [name] on Friday to return to the emergency department to have the packing removed and dressing changed on. Prescription for Percocet on one or 2 tablets every 6 hours #20 given to the patient, as his Norco was not effective for his pain. Patient acknowledged  understanding of the discharge structure is.    Final diagnoses:  Change or removal of wound dressing    *I have reviewed nursing notes, vital  signs, and all appropriate lab and imaging results for this patient.Lily Kocher, PA-C 03/20/16 [place] Liu, MD 03/20/16 [phone]

## 2016-03-20 NOTE — Discharge Instructions (Signed)
Please call [name] tomorrow for an office appointment on Friday. If you are unable to be seen on Friday, please return to the emergency department to have the packing removed and the dressing changed. Please use Percocet one or 2 every 6 hours as needed for pain. This medication may cause drowsiness, and/or constipation. Please use a stool softener while taking this medication. Please return to the emergency department if any high fever, unusual bleeding, or unusual drainage. Wound Check If you have a wound, it may take some time to heal. Eventually, a scar will form. The scar will also fade with time. It is important to take care of your wound while it is healing. This helps to protect your wound from infection.  HOW SHOULD I TAKE CARE OF MY WOUND AT HOME?  Some wounds are allowed to close on their own or are repaired at a later date. There are many different ways to close and cover a wound, including stitches (sutures), skin glue, and adhesive strips. Follow your health care provider's instructions about:  Wound care.  Bandage (dressing) changes and removal.  Wound closure removal.  Take medicines only as directed by your health care provider.  Keep all follow-up visits as directed by your health care provider. This is important.  Do not take baths, swim, or use a hot tub until your health care provider approves. You may shower as directed by your health care provider.  Keep your wound clean and dry. WHAT AFFECTS SCAR FORMATION? Scars affect each person differently. How your body scars depends on:  The location and size of your wound.  Traits that you inherited from your parents (genetic predisposition).  How you take care of your wound. Irritation and inflammation increase the amount of scar formation.  Sun exposure. This can darken a scar. WHEN SHOULD I CALL OR SEE [facility] PROVIDER? Call or see your health care provider if:  You have redness, swelling, or pain at your  wound site.  You have fluid, blood, or pus coming from your wound.  You have muscle aches, chills, or a general ill feeling.  You notice a bad smell coming from the wound.  Your wound separates after the sutures, staples, or skin adhesive strips have been removed.  You have persistent nausea or vomiting.  You have a fever.  You are dizzy. WHEN SHOULD I CALL 911 OR GO TO THE EMERGENCY ROOM? Call 911 or go to the emergency room if:  You faint.  You have difficulty breathing.   This information is not intended to replace advice given to you by your health care provider. Make sure you discuss any questions you have with your health care provider.   Document Released: 06/09/2004 Document Revised: 09/24/2014 Document Reviewed: 06/15/2014 Elsevier Interactive Patient Education [employer].

## 2016-04-02 ENCOUNTER — Other Ambulatory Visit: Payer: Self-pay | Admitting: Oncology

## 2016-04-06 ENCOUNTER — Other Ambulatory Visit (HOSPITAL_BASED_OUTPATIENT_CLINIC_OR_DEPARTMENT_OTHER): Payer: BLUE CROSS/BLUE SHIELD

## 2016-04-06 ENCOUNTER — Telehealth: Payer: Self-pay | Admitting: Oncology

## 2016-04-06 ENCOUNTER — Ambulatory Visit (HOSPITAL_BASED_OUTPATIENT_CLINIC_OR_DEPARTMENT_OTHER): Payer: BLUE CROSS/BLUE SHIELD | Admitting: Oncology

## 2016-04-06 VITALS — BP 121/74 | HR 77 | Temp 98.6°F | Resp 17 | Ht 64.0 in | Wt 159.7 lb

## 2016-04-06 DIAGNOSIS — C7889 Secondary malignant neoplasm of other digestive organs: Secondary | ICD-10-CM

## 2016-04-06 DIAGNOSIS — C649 Malignant neoplasm of unspecified kidney, except renal pelvis: Secondary | ICD-10-CM

## 2016-04-06 DIAGNOSIS — C642 Malignant neoplasm of left kidney, except renal pelvis: Secondary | ICD-10-CM

## 2016-04-06 DIAGNOSIS — D509 Iron deficiency anemia, unspecified: Secondary | ICD-10-CM

## 2016-04-06 DIAGNOSIS — N289 Disorder of kidney and ureter, unspecified: Secondary | ICD-10-CM | POA: Diagnosis not present

## 2016-04-06 DIAGNOSIS — Z85528 Personal history of other malignant neoplasm of kidney: Secondary | ICD-10-CM

## 2016-04-06 LAB — CBC WITH DIFFERENTIAL/PLATELET
BASO%: 1 % (ref 0.0–2.0)
BASOS ABS: 0 10*3/uL (ref 0.0–0.1)
EOS ABS: 0.1 10*3/uL (ref 0.0–0.5)
EOS%: 1.5 % (ref 0.0–7.0)
HCT: 29.8 % — ABNORMAL LOW (ref 38.4–49.9)
HGB: 9.6 g/dL — ABNORMAL LOW (ref 13.0–17.1)
LYMPH%: 17.9 % (ref 14.0–49.0)
MCH: 30.4 pg (ref 27.2–33.4)
MCHC: 32.2 g/dL (ref 32.0–36.0)
MCV: 94.5 fL (ref 79.3–98.0)
MONO#: 0.4 10*3/uL (ref 0.1–0.9)
MONO%: 10.1 % (ref 0.0–14.0)
NEUT#: 2.9 10*3/uL (ref 1.5–6.5)
NEUT%: 69.5 % (ref 39.0–75.0)
PLATELETS: 232 10*3/uL (ref 140–400)
RBC: 3.15 10*6/uL — AB (ref 4.20–5.82)
RDW: 19.3 % — ABNORMAL HIGH (ref 11.0–14.6)
WBC: 4.1 10*3/uL (ref 4.0–10.3)
lymph#: 0.7 10*3/uL — ABNORMAL LOW (ref 0.9–3.3)

## 2016-04-06 LAB — COMPREHENSIVE METABOLIC PANEL
ALK PHOS: 74 U/L (ref 40–150)
ALT: <9 U/L (ref 0–55)
ANION GAP: 8 meq/L (ref 3–11)
AST: 14 U/L (ref 5–34)
Albumin: 3.3 g/dL — ABNORMAL LOW (ref 3.5–5.0)
BILIRUBIN TOTAL: 0.37 mg/dL (ref 0.20–1.20)
BUN: 13.1 mg/dL (ref 7.0–26.0)
CO2: 28 meq/L (ref 22–29)
Calcium: 9.7 mg/dL (ref 8.4–10.4)
Chloride: 105 mEq/L (ref 98–109)
Creatinine: 1.6 mg/dL — ABNORMAL HIGH (ref 0.7–1.3)
EGFR: 46 mL/min/{1.73_m2} — AB (ref >90)
GLUCOSE: 95 mg/dL (ref 70–140)
POTASSIUM: 4.7 meq/L (ref 3.5–5.1)
SODIUM: 142 meq/L (ref 136–145)
Total Protein: 7.5 g/dL (ref 6.4–8.3)

## 2016-04-06 NOTE — Progress Notes (Signed)
Hematology and Oncology Follow Up Visit  Jeffrey [name] 04/06/2016     Principle Diagnosis: 65 year old with the following issues:  1. T1b right kidney cancer diagnosed in 2010. He is S/P rdical nephrectomy on June 27, 2009. The pathological staging was T1b clear cell histology with Fuhrman grade 3/4.  2. Left Kidney mass  Measuring 4.5 x 2 7 x 3.6 irregular enhancing appears to be a new kidney cancer noted in 07/2012. 3. Pancreatic lesion. 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 4. Votrient  800 mg daily started in December 2013. Treatment held in 11/2012 due to increase LFTs. Votrient was resumed in April 2014 at 400 mg daily. His dose was reduced further to 200 mg daily starting in September 2016. Therapy discontinued in June 2017 because of progression of disease.   Current therapy: Cabometyx 40 mg daily started in July 2017.  Interim History: Jeffrey Trujillo presents for a follow up visit. Since his last visit, he remains relatively stable. He continues to have issues with perirectal abscess and required another drainage recently. He continues to have rectal pain but no diarrhea.  He started Cabometyxwithout any sde effects at this time. He does not report any nausea or abdominal distention. He denied any diarrhea or excessive fatigue. His medication has been on held her periodically because of recurrent abscesses. He has resumed it recently without any issues at this time.  He is not having any fevers or chills. He did not report any headaches blurred vision double vision. Does not report any motor sensory neuropathy or alteration of mental status. He does not report any depression or anxiety. He does not report any frequency urgency or hesitancy. He does not report any other urinary symptoms. Remainder of his review of systems is unremarkable.  Medications: Unchanged by my review today. Current Outpatient Prescriptions  Medication Sig Dispense Refill  .  acetaminophen (TYLENOL) 650 MG CR tablet Take 650 mg by mouth every 8 (eight) hours as needed for pain.    Marland Kitchen CABOMETYX 40 MG TABS TAKE 1 TABLET BY MOUTH ONE TIME DAILY WITH AT LEAST 8 OZ OF WATER ON AN EMPTY STOMACH. DO NOT EAT FOR 2 HOURS BEFORE OR 1 HOUR AFTER. SWALLO 30 tablet 5  . ciprofloxacin (CIPRO) 500 MG tablet Take 1 tablet (500 mg total) by mouth 2 (two) times daily. (Patient not taking: Reported on 03/19/2016) 20 tablet 0  . diphenhydrAMINE (BENADRYL) 25 MG tablet Take 25 mg by mouth daily as needed for itching. Reported on 02/26/2016    . docusate sodium (COLACE) 100 MG capsule Take 1 capsule (100 mg total) by mouth every 12 (twelve) hours. (Patient not taking: Reported on 03/19/2016) 60 capsule 0  . HYDROcodone-acetaminophen (NORCO/VICODIN) 5-325 MG tablet Take 1-2 tablets by mouth every 6 (six) hours as needed. 20 tablet 0  . oxyCODONE-acetaminophen (PERCOCET/ROXICET) 5-325 MG tablet Take 1-2 tablets by mouth every 6 (six) hours as needed. 20 tablet 0  . pantoprazole (PROTONIX) 40 MG tablet TAKE (1) TABLET BY MOUTH TWICE A DAY BEFORE MEALS. (BREAKFAST AND SUPPER) 60 tablet 5  . topiramate (TOPAMAX) 25 MG tablet Take 25 mg by mouth 2 (two) times daily.     No current facility-administered medications for this visit.     Allergies:  Allergies  Allergen Reactions  . Nsaids Other (See Comments)    Patient only has one kidney.   . Meperidine Hcl Hives and Rash    Past Medical History, Surgical history, Social  history, and Family History were reviewed and updated.   Physical Exam: Blood pressure 121/74, pulse 77, temperature 98.6 F (37 C), temperature source Oral, resp. rate 17, height 5\' 4"  (1.626 m), weight 159 lb 11.2 oz (72.439 kg), SpO2 100 %. ECOG: 1 General appearance: Pleasant-appearing gentleman without distress. Head: No oral ulcers or lesions. Neck: no adenopathy, no masses Lymph nodes: Cervical, supraclavicular, and axillary nodes normal. Heart:regular rate and  rhythm, S1, S2.  Lung:chest clear, no wheezing, or dullness to percussion. Abdomen: soft, non-tender, without masses or organomegaly. No rebound or guarding. EXT: bilateral edema noted. Neurological exam: no motor or sensory deficits noted. Skin: No rashes or lesions noted. no petechiae or ecchymosis.  Neurological: Intact no deficits. No motor or sensory deficits.  CBC    Component Value Date/Time   WBC 4.1 04/06/2016 1138   WBC 5.4 03/19/2016 1636   RBC 3.15* 04/06/2016 1138   RBC 2.66* 03/19/2016 1636   RBC 2.33* 12/08/2015 0956   HGB 9.6* 04/06/2016 1138   HGB 8.2* 03/19/2016 1651   HCT 29.8* 04/06/2016 1138   HCT 24.0* 03/19/2016 1651   PLT 232 04/06/2016 1138   PLT 64* 03/19/2016 1636   MCV 94.5 04/06/2016 1138   MCV 100.4* 03/19/2016 1636   MCH 30.4 04/06/2016 1138   MCH 32.7 03/19/2016 1636   MCHC 32.2 04/06/2016 1138   MCHC 32.6 03/19/2016 1636   RDW 19.3* 04/06/2016 1138   RDW 18.4* 03/19/2016 1636   LYMPHSABS 0.7* 04/06/2016 1138   LYMPHSABS 0.3* 03/19/2016 1636   MONOABS 0.4 04/06/2016 1138   MONOABS 0.3 03/19/2016 1636   EOSABS 0.1 04/06/2016 1138   EOSABS 0.0 03/19/2016 1636   BASOSABS 0.0 04/06/2016 1138   BASOSABS 0.0 03/19/2016 1636       Impression and Plan:   A 65 year old gentleman with the following issues:   1. A history of renal cell carcinoma. He had stage T1b diagnosed in October 2010 of the right kidney, status post right nephrectomy. He also has left kidney cancer and pancreatic metastasis. He was initially started on Votrient in December 2013. He had dose reduction on few occasions. The patient currently on Votrient 200 mg daily since September 2016 dose reduction.  CT scan obtained on 02/26/2016 was reviewed and showed slow progression of disease especially in his left renal mass.   He is currently on Cabometyx and have tolerated it well although he has not taken it persistently because of his perirectal abscess. The plan is to  continue with the same dose and schedule and continue to monitor him periodically. 8 imaging studies in 3-4 months.  2. Pancreatic lesion. Biopsy proven to be malignant and likely represent renal cell cancer. It appears less prominent at this time.  3. Thrombocytopenia: Have resolved at this time.  4. Anemia.  Related to iron deficiency as well as renal insufficiency. Hemoglobin appears stable and improving at this time.  5. Increase LFTs:  We will continue to monitor this moving forward. His most recent LFTs were within normal range.  6. Weight loss: Have declined by 5 pounds related to his recurrent abscess and infection. We have discussed strategies to improve his oral intake and keep nutritional supplements as an option.  7. Perirectal abscess: Continues to be initiated this time he is following with Gen. surgery.  8. Follow-up:  4 weeks for evaluation.  [name] MD 04/06/2016

## 2016-04-06 NOTE — Telephone Encounter (Signed)
per pof to sch pt apt-gave pt copy of avs

## 2016-04-11 ENCOUNTER — Other Ambulatory Visit: Payer: Self-pay | Admitting: Nurse Practitioner

## 2016-04-12 ENCOUNTER — Telehealth: Payer: Self-pay | Admitting: Internal Medicine

## 2016-04-12 MED ORDER — PANTOPRAZOLE SODIUM 40 MG PO TBEC
40.0000 mg | DELAYED_RELEASE_TABLET | Freq: Two times a day (BID) | ORAL | 5 refills | Status: DC
Start: 2016-04-12 — End: 2017-11-15

## 2016-04-12 NOTE — Telephone Encounter (Signed)
We do not manage topamax, but I refilled his Protonix. I believe this is what he meant.

## 2016-04-12 NOTE — Addendum Note (Signed)
Addended by: Orvil Feil on: 04/12/2016 11:52 AM   Modules accepted: Orders

## 2016-04-12 NOTE — Telephone Encounter (Signed)
Pt needs a refill on his topamax to [facility]

## 2016-04-12 NOTE — Telephone Encounter (Signed)
Routing to the refill box. 

## 2016-04-17 ENCOUNTER — Other Ambulatory Visit: Payer: Self-pay | Admitting: Internal Medicine

## 2016-04-17 DIAGNOSIS — C649 Malignant neoplasm of unspecified kidney, except renal pelvis: Secondary | ICD-10-CM

## 2016-04-19 ENCOUNTER — Other Ambulatory Visit: Payer: Self-pay | Admitting: Internal Medicine

## 2016-04-19 DIAGNOSIS — C649 Malignant neoplasm of unspecified kidney, except renal pelvis: Secondary | ICD-10-CM

## 2016-04-20 ENCOUNTER — Other Ambulatory Visit: Payer: Self-pay | Admitting: General Surgery

## 2016-04-20 NOTE — H&P (Signed)
History of Present Illness Leighton Ruff MD; [id number] 9:26 AM) The patient is a 65 year old male who presents with anal fistula. 65 year old male who presents to my office as a referral from [name] for a nonhealing perianal wound. He states that over the past 3 months he has had difficulty with an abscess in his perirectal area. This has been drained before and continues to drain the purulence and clear bloody fluid. He has tried antibiotics as well with no change in his symptoms. He has never had any anorectal procedures before. He does have renal cell carcinoma and is on by mouth chemotherapy for this.   Other Problems [name], [name]; 04/20/2016 9:01 AM) Alcohol Abuse Anxiety Disorder Diverticulosis Gastroesophageal Reflux Disease Inguinal Hernia Umbilical Hernia Repair  Past Surgical History [name], [name]; 04/20/2016 9:01 AM) Nephrectomy Right. Open Inguinal Hernia Surgery Left. Oral Surgery  Allergies [name], [name]; 04/20/2016 9:01 AM) No Known Drug Allergies 04/20/2016  Medication History [name], [name]; 04/20/2016 9:02 AM) Cabometyx (40MG  Tablet, Oral) Active. Protonix (40MG  Tablet DR, Oral) Active. Topamax (25MG  Tablet, Oral) Active. Medications Reconciled  Social History [name], [name]; 04/20/2016 9:01 AM) Caffeine use Carbonated beverages.  Family History [name], [name]; 04/20/2016 9:01 AM) Prostate Cancer Brother. Respiratory Condition Brother.     Review of Systems [name] [name]; 04/20/2016 9:01 AM) General Present- Chills. Not Present- Appetite Loss, Fatigue, Fever, Night Sweats, Weight Gain and Weight Loss. HEENT Present- Earache, Ringing in the Ears and Wears glasses/contact lenses. Not Present- Hearing Loss, Hoarseness, Nose Bleed, Oral Ulcers, Seasonal Allergies, Sinus Pain, Sore Throat, Visual Disturbances and Yellow Eyes. Gastrointestinal Present- Constipation, Indigestion and Rectal Pain. Not Present- Abdominal Pain,  Bloating, Bloody Stool, Change in Bowel Habits, Chronic diarrhea, Difficulty Swallowing, Excessive gas, Gets full quickly at meals, Hemorrhoids, Nausea and Vomiting. Male Genitourinary Not Present- Blood in Urine, Change in Urinary Stream, Frequency, Impotence, Nocturia, Painful Urination, Urgency and Urine Leakage.  Vitals [name] [name]; 04/20/2016 9:03 AM) 04/20/2016 9:02 AM Weight: 157 lb Height: 71in Body Surface Area: 1.9 m Body Mass Index: 21.9 kg/m  Temp.: 97.61F(Temporal)  Pulse: 98 (Regular)  BP: 128/80 (Sitting, Left Arm, Standard)      Physical Exam Leighton Ruff MD; [id number] 9:24 AM)  General Mental Status-Alert. General Appearance-Not in acute distress. Build & Nutrition-Well nourished. Posture-Normal posture. Gait-Normal.  Head and Neck Head-normocephalic, atraumatic with no lesions or palpable masses. Trachea-midline.  Chest and Lung Exam Chest and lung exam reveals -on auscultation, normal breath sounds, no adventitious sounds and normal vocal resonance.  Cardiovascular Cardiovascular examination reveals -normal heart sounds, regular rate and rhythm with no murmurs.  Abdomen Inspection Inspection of the abdomen reveals - No Hernias. Palpation/Percussion Palpation and Percussion of the abdomen reveal - Soft, Non Tender, No Rigidity (guarding), No hepatosplenomegaly and No Palpable abdominal masses.  Rectal Note: R anteriolateral external opening c/w fistula disease  Neurologic Neurologic evaluation reveals -alert and oriented x 3 with no impairment of recent or remote memory, normal attention span and ability to concentrate, normal sensation and normal coordination.  Musculoskeletal Normal Exam - Bilateral-Upper Extremity Strength Normal and Lower Extremity Strength Normal.    Assessment & Plan Leighton Ruff MD; [id number] 9:27 AM)  ANAL FISTULA (K60.3) Impression: 65 year old male with classic signs and symptoms  of an anal fistula. I have recommended that he undergo an anal exam under anesthesia with fistulotomy versus seton placement depending on the fistula tract. I will touch base with his oncologist prior to surgery. We  discussed the risk of surgery which are bleeding, infection, prolonged healing, recurrence and a small chance of incontinence.

## 2016-04-26 ENCOUNTER — Telehealth: Payer: Self-pay | Admitting: Pharmacist

## 2016-04-26 NOTE — Telephone Encounter (Signed)
Oral Chemotherapy Follow-Up Form  Original Start date of oral chemotherapy: July 2017   Called patient today & s/w his wife to follow up regarding patient's oral chemotherapy medication: Cabometyx  Pts wife reports 0 tablets/doses missed since pt saw [name] on 04/06/16. Takes on empty stomach.  Is staying well hydrated.  Pt reports the following side effects:  Fatigue - gets tired easily  No nausea/diarrhea. Pt is doing pretty good and has taken his Cabometyx consistently. He is awaiting fistula repair surgery and this has not been scheduled.  When he has the repair, he'll likely not take the Cabometyx.  He has 9 tabs remaining.  I suggested pt/his wife call [facility] b/c there are 5 refills on the Rx sent by [name] to [facility] on 04/02/16.  It they need more refills, they know to call the oral chemo clinic.  They have that phone #.  Will follow up after pt has lab/MD visit on 05/10/16.  Kennith Center, Pharm.D., CPP 04/26/2016@4 :55 PM Oral Chemotherapy Clinic

## 2016-05-10 ENCOUNTER — Telehealth: Payer: Self-pay | Admitting: Oncology

## 2016-05-10 ENCOUNTER — Other Ambulatory Visit (HOSPITAL_BASED_OUTPATIENT_CLINIC_OR_DEPARTMENT_OTHER): Payer: BLUE CROSS/BLUE SHIELD

## 2016-05-10 ENCOUNTER — Ambulatory Visit (HOSPITAL_BASED_OUTPATIENT_CLINIC_OR_DEPARTMENT_OTHER): Payer: BLUE CROSS/BLUE SHIELD | Admitting: Oncology

## 2016-05-10 VITALS — BP 126/89 | HR 87 | Temp 98.1°F | Resp 18 | Ht 71.0 in | Wt 152.2 lb

## 2016-05-10 DIAGNOSIS — C642 Malignant neoplasm of left kidney, except renal pelvis: Secondary | ICD-10-CM | POA: Diagnosis not present

## 2016-05-10 DIAGNOSIS — D509 Iron deficiency anemia, unspecified: Secondary | ICD-10-CM | POA: Diagnosis not present

## 2016-05-10 DIAGNOSIS — C649 Malignant neoplasm of unspecified kidney, except renal pelvis: Secondary | ICD-10-CM

## 2016-05-10 DIAGNOSIS — N289 Disorder of kidney and ureter, unspecified: Secondary | ICD-10-CM | POA: Diagnosis not present

## 2016-05-10 DIAGNOSIS — C7989 Secondary malignant neoplasm of other specified sites: Secondary | ICD-10-CM | POA: Diagnosis not present

## 2016-05-10 DIAGNOSIS — D696 Thrombocytopenia, unspecified: Secondary | ICD-10-CM

## 2016-05-10 LAB — CBC WITH DIFFERENTIAL/PLATELET
BASO%: 0.3 % (ref 0.0–2.0)
Basophils Absolute: 0 10*3/uL (ref 0.0–0.1)
EOS%: 0.8 % (ref 0.0–7.0)
Eosinophils Absolute: 0 10*3/uL (ref 0.0–0.5)
HEMATOCRIT: 36.6 % — AB (ref 38.4–49.9)
HGB: 12 g/dL — ABNORMAL LOW (ref 13.0–17.1)
LYMPH%: 23.2 % (ref 14.0–49.0)
MCH: 30.2 pg (ref 27.2–33.4)
MCHC: 32.8 g/dL (ref 32.0–36.0)
MCV: 92 fL (ref 79.3–98.0)
MONO#: 0.3 10*3/uL (ref 0.1–0.9)
MONO%: 7 % (ref 0.0–14.0)
NEUT#: 2.5 10*3/uL (ref 1.5–6.5)
NEUT%: 68.7 % (ref 39.0–75.0)
Platelets: 33 10*3/uL — ABNORMAL LOW (ref 140–400)
RBC: 3.98 10*6/uL — ABNORMAL LOW (ref 4.20–5.82)
RDW: 16.4 % — ABNORMAL HIGH (ref 11.0–14.6)
WBC: 3.7 10*3/uL — ABNORMAL LOW (ref 4.0–10.3)
lymph#: 0.9 10*3/uL (ref 0.9–3.3)
nRBC: 0 % (ref 0–0)

## 2016-05-10 LAB — COMPREHENSIVE METABOLIC PANEL
ALT: 57 U/L — ABNORMAL HIGH (ref 0–55)
ANION GAP: 10 meq/L (ref 3–11)
AST: 107 U/L — AB (ref 5–34)
Albumin: 3.5 g/dL (ref 3.5–5.0)
Alkaline Phosphatase: 158 U/L — ABNORMAL HIGH (ref 40–150)
BUN: 17 mg/dL (ref 7.0–26.0)
CALCIUM: 9.6 mg/dL (ref 8.4–10.4)
CHLORIDE: 92 meq/L — AB (ref 98–109)
CO2: 32 mEq/L — ABNORMAL HIGH (ref 22–29)
Creatinine: 1.8 mg/dL — ABNORMAL HIGH (ref 0.7–1.3)
EGFR: 38 mL/min/{1.73_m2} — ABNORMAL LOW (ref >90)
Glucose: 120 mg/dl (ref 70–140)
POTASSIUM: 3.7 meq/L (ref 3.5–5.1)
Sodium: 134 mEq/L — ABNORMAL LOW (ref 136–145)
Total Bilirubin: 1.24 mg/dL — ABNORMAL HIGH (ref 0.20–1.20)
Total Protein: 7.9 g/dL (ref 6.4–8.3)

## 2016-05-10 NOTE — Progress Notes (Signed)
Hematology and Oncology Follow Up Visit  Jeffrey Trujillo [id number] 05/10/16     Principle Diagnosis: 65 year old with the following issues:  1. T1b right kidney cancer diagnosed in 2010. He is S/P rdical nephrectomy on June 27, 2009. The pathological staging was T1b clear cell histology with Fuhrman grade 3/4.  2. Left Kidney mass  Measuring 4.5 x 2 7 x 3.6 irregular enhancing appears to be a new kidney cancer noted in 07/2012. 3. Pancreatic lesion. 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 4. Votrient  800 mg daily started in December 2013. Treatment held in 11/2012 due to increase LFTs. Votrient was resumed in April 2014 at 400 mg daily. His dose was reduced further to 200 mg daily starting in September 2016. Therapy discontinued in June 2017 because of progression of disease.   Current therapy: Cabometyx 40 mg daily started in July 2017.  Interim History: Jeffrey Trujillo presents for a follow up visit. Since his last visit, he reports no major changes in his health. He continues to take Cabometyx without any sde effects at this time. He does not report any nausea or abdominal distention. He denied any diarrhea or excessive fatigue. He denied any bleeding complications but does report anorexia. He lost 5 pounds since the last visit. He reports his appetite have been fluctuating but without any dysphagia or odynophagia. He denied any excessive fatigue or inability to perform activities of daily living. His quality of life remains unchanged.  He is not having any fevers or chills. He did not report any headaches blurred vision double vision. Does not report any motor sensory neuropathy or alteration of mental status. He does not report any depression or anxiety. He does not report any frequency urgency or hesitancy. He does not report any other urinary symptoms. Remainder of his review of systems is unremarkable.  Medications: Unchanged by my review today. Current Outpatient  Prescriptions  Medication Sig Dispense Refill  . acetaminophen (TYLENOL) 650 MG CR tablet Take 650 mg by mouth every 8 (eight) hours as needed for pain.    Marland Kitchen CABOMETYX 40 MG TABS TAKE 1 TABLET BY MOUTH ONE TIME DAILY WITH AT LEAST 8 OZ OF WATER ON AN EMPTY STOMACH. DO NOT EAT FOR 2 HOURS BEFORE OR 1 HOUR AFTER. SWALLO 30 tablet 5  . diphenhydrAMINE (BENADRYL) 25 MG tablet Take 25 mg by mouth daily as needed for itching. Reported on 02/26/2016    . docusate sodium (COLACE) 100 MG capsule Take 1 capsule (100 mg total) by mouth every 12 (twelve) hours. 60 capsule 0  . pantoprazole (PROTONIX) 40 MG tablet Take 1 tablet (40 mg total) by mouth 2 (two) times daily before a meal. 60 tablet 5  . topiramate (TOPAMAX) 25 MG tablet Take 25 mg by mouth 2 (two) times daily.     No current facility-administered medications for this visit.      Allergies:  Allergies  Allergen Reactions  . Nsaids Other (See Comments)    Patient only has one kidney.   . Meperidine Hcl Hives and Rash    Past Medical History, Surgical history, Social history, and Family History were reviewed and updated.   Physical Exam: Blood pressure 126/89, pulse 87, temperature 98.1 F (36.7 C), temperature source Oral, resp. rate 18, height 5\' 11"  (1.803 m), weight 152 lb 3.2 oz (69 kg), SpO2 100 %. ECOG: 1 General appearance: Alert, awake gentleman without distress. Head: No oral ulcers or lesions. Sclerae anicteric. Neck: no adenopathy,  no masses Lymph nodes: Cervical, supraclavicular, and axillary nodes normal. Heart:regular rate and rhythm, S1, S2.  Lung:chest clear, no wheezing, or dullness to percussion. Abdomen: soft, non-tender, without masses or organomegaly. No shifting dullness or ascites. EXT: bilateral edema noted. Neurological exam: no motor or sensory deficits noted. Skin: No rashes or lesions noted. no petechiae or ecchymosis.  Neurological: Intact no deficits. No motor or sensory deficits.  CBC    Component  Value Date/Time   WBC 3.7 (L) 05/10/2016 1429   WBC 5.4 03/19/2016 1636   RBC 3.98 (L) 05/10/2016 1429   RBC 2.66 (L) 03/19/2016 1636   HGB 12.0 (L) 05/10/2016 1429   HCT 36.6 (L) 05/10/2016 1429   PLT 33 (L) 05/10/2016 1429   MCV 92.0 05/10/2016 1429   MCH 30.2 05/10/2016 1429   MCH 32.7 03/19/2016 1636   MCHC 32.8 05/10/2016 1429   MCHC 32.6 03/19/2016 1636   RDW 16.4 (H) 05/10/2016 1429   LYMPHSABS 0.9 05/10/2016 1429   MONOABS 0.3 05/10/2016 1429   EOSABS 0.0 05/10/2016 1429   BASOSABS 0.0 05/10/2016 1429       Impression and Plan:   A 65 year old gentleman with the following issues:   1. A history of renal cell carcinoma. He had stage T1b diagnosed in October 2010 of the right kidney, status post right nephrectomy. He also has left kidney cancer and pancreatic metastasis. He was initially started on Votrient in December 2013. He had dose reduction on few occasions. The patient currently on Votrient 200 mg daily since September 2016 dose reduction.  CT scan obtained on 02/26/2016 was reviewed and showed slow progression of disease especially in his left renal mass.   He is currently on Cabometyx and have tolerated it well. He does have thrombocytopenia so she will this medication and I have asked him to hold it for one week in anticipation of this coming surgery. He will resume it after that at the full dose and we will consider dose reduction thrombocytopenia continues to be an issue.  2. Pancreatic lesion. Biopsy proven to be malignant and likely represent renal cell cancer. It appears less prominent at this time.  3. Thrombocytopenia: No active bleeding noted and likely related to Cabometyx. The plan is to hold this medication for the time being and resume it after his surgery.  4. Anemia.  Related to iron deficiency as well as renal insufficiency. Hemoglobin appears close to normal range.  5. Increase LFTs:  We will continue to monitor this moving forward. His most  recent LFTs were within normal range.  6. Weight loss: Have declined by 5 pounds related to his recurrent abscess and infection. Dose reductions may be needed in the future to prevent any further weight loss. We discussed different strategies to improve his nutritional intake.  7. Perirectal abscess: Prior to have surgery on 05/18/2016.  8. Follow-up:  4 weeks for evaluation.  [name] MD 05/10/16

## 2016-05-10 NOTE — Telephone Encounter (Signed)
GAVE PATIENT AVS REPORT AND APPOINTMENTS FOR September.  °

## 2016-05-15 ENCOUNTER — Encounter (HOSPITAL_BASED_OUTPATIENT_CLINIC_OR_DEPARTMENT_OTHER): Payer: Self-pay | Admitting: *Deleted

## 2016-05-16 ENCOUNTER — Encounter (HOSPITAL_BASED_OUTPATIENT_CLINIC_OR_DEPARTMENT_OTHER): Payer: Self-pay | Admitting: *Deleted

## 2016-05-16 NOTE — Progress Notes (Signed)
SPOKE W/ PT'S WIFE AND PT VIA SPEAKER PHONE.  NPO AFTER MN WITH EXCEPTION CLEAR LIQUIDS UNTIL 0730 (NO CREAM/ MILK PRODUCTS).  PT VERBALIZED UNDERSTANDING THIS INCLUDES NO CHEW TOBACCO.  CURRENT LAB RESULTS AND EKG IN CHART AND EPIC.  WILL TAKE PROTONIX, TOPAMAX, AND COLACE AM DOS W/ SIPS OF WATER AND IF NEEDED MAY TAKE TYLENOL/ TRAMADOL.

## 2016-05-18 ENCOUNTER — Ambulatory Visit (HOSPITAL_BASED_OUTPATIENT_CLINIC_OR_DEPARTMENT_OTHER): Payer: BLUE CROSS/BLUE SHIELD | Admitting: Anesthesiology

## 2016-05-18 ENCOUNTER — Encounter (HOSPITAL_BASED_OUTPATIENT_CLINIC_OR_DEPARTMENT_OTHER)
Admission: RE | Disposition: A | Discharge status: Home / Self Care | Payer: Self-pay | Source: Ambulatory Visit | Attending: General Surgery

## 2016-05-18 ENCOUNTER — Ambulatory Visit (HOSPITAL_BASED_OUTPATIENT_CLINIC_OR_DEPARTMENT_OTHER)
Admission: RE | Admit: 2016-05-18 | Discharge: 2016-05-18 | Disposition: A | Discharge status: Home / Self Care | Payer: BLUE CROSS/BLUE SHIELD | Source: Ambulatory Visit | Attending: General Surgery | Admitting: General Surgery

## 2016-05-18 ENCOUNTER — Encounter (HOSPITAL_BASED_OUTPATIENT_CLINIC_OR_DEPARTMENT_OTHER): Payer: Self-pay

## 2016-05-18 DIAGNOSIS — C7889 Secondary malignant neoplasm of other digestive organs: Secondary | ICD-10-CM | POA: Diagnosis not present

## 2016-05-18 DIAGNOSIS — Z87891 Personal history of nicotine dependence: Secondary | ICD-10-CM | POA: Diagnosis not present

## 2016-05-18 DIAGNOSIS — C649 Malignant neoplasm of unspecified kidney, except renal pelvis: Secondary | ICD-10-CM | POA: Insufficient documentation

## 2016-05-18 DIAGNOSIS — K219 Gastro-esophageal reflux disease without esophagitis: Secondary | ICD-10-CM | POA: Diagnosis not present

## 2016-05-18 DIAGNOSIS — Z79899 Other long term (current) drug therapy: Secondary | ICD-10-CM | POA: Insufficient documentation

## 2016-05-18 DIAGNOSIS — J449 Chronic obstructive pulmonary disease, unspecified: Secondary | ICD-10-CM | POA: Diagnosis not present

## 2016-05-18 DIAGNOSIS — K603 Anal fistula: Secondary | ICD-10-CM | POA: Diagnosis present

## 2016-05-18 DIAGNOSIS — K610 Anal abscess: Secondary | ICD-10-CM | POA: Insufficient documentation

## 2016-05-18 DIAGNOSIS — Z905 Acquired absence of kidney: Secondary | ICD-10-CM | POA: Diagnosis not present

## 2016-05-18 HISTORY — DX: Chronic kidney disease, stage 3 unspecified: N18.30

## 2016-05-18 HISTORY — DX: Other chronic pain: G89.29

## 2016-05-18 HISTORY — DX: Anal fistula, unspecified: K60.30

## 2016-05-18 HISTORY — DX: Chronic kidney disease, unspecified: N18.9

## 2016-05-18 HISTORY — DX: Thrombocytopenia, unspecified: D69.6

## 2016-05-18 HISTORY — DX: Nocturia: R35.1

## 2016-05-18 HISTORY — DX: Presence of spectacles and contact lenses: Z97.3

## 2016-05-18 HISTORY — DX: Malignant neoplasm of unspecified kidney, except renal pelvis: C64.9

## 2016-05-18 HISTORY — DX: Gout, unspecified: M10.9

## 2016-05-18 HISTORY — DX: Anal fistula: K60.3

## 2016-05-18 HISTORY — DX: Personal history of peptic ulcer disease: Z87.11

## 2016-05-18 HISTORY — DX: Iron deficiency anemia secondary to blood loss (chronic): D50.0

## 2016-05-18 HISTORY — DX: Chronic kidney disease, stage 3 (moderate): N18.3

## 2016-05-18 HISTORY — DX: Chronic kidney disease, unspecified: D63.1

## 2016-05-18 HISTORY — DX: Personal history of other diseases of the digestive system: Z87.19

## 2016-05-18 SURGERY — EXAM UNDER ANESTHESIA
Anesthesia: Monitor Anesthesia Care | Site: Rectum

## 2016-05-18 MED ORDER — BUPIVACAINE-EPINEPHRINE 0.5% -1:200000 IJ SOLN
INTRAMUSCULAR | Status: DC | PRN
  Administered 2016-05-18: 30 mL

## 2016-05-18 MED ORDER — LIDOCAINE HCL (CARDIAC) 20 MG/ML IV SOLN
INTRAVENOUS | Status: DC | PRN
  Administered 2016-05-18: 50 mg via INTRAVENOUS

## 2016-05-18 MED ORDER — OXYCODONE HCL 5 MG PO TABS
5.0000 mg | ORAL_TABLET | ORAL | Status: DC | PRN
  Filled 2016-05-18: qty 2

## 2016-05-18 MED ORDER — ACETAMINOPHEN 325 MG PO TABS
650.0000 mg | ORAL_TABLET | ORAL | Status: DC | PRN
Start: 2016-05-18 — End: 2016-05-18
  Filled 2016-05-18: qty 2

## 2016-05-18 MED ORDER — MIDAZOLAM HCL 5 MG/5ML IJ SOLN
INTRAMUSCULAR | Status: DC | PRN
  Administered 2016-05-18: 2 mg via INTRAVENOUS

## 2016-05-18 MED ORDER — PROMETHAZINE HCL 25 MG/ML IJ SOLN
6.2500 mg | INTRAMUSCULAR | Status: DC | PRN
  Filled 2016-05-18: qty 1

## 2016-05-18 MED ORDER — DEXAMETHASONE SODIUM PHOSPHATE 4 MG/ML IJ SOLN
INTRAMUSCULAR | Status: DC | PRN
  Administered 2016-05-18: 10 mg via INTRAVENOUS

## 2016-05-18 MED ORDER — SODIUM CHLORIDE 0.9% FLUSH
3.0000 mL | Freq: Two times a day (BID) | INTRAVENOUS | Status: DC
  Filled 2016-05-18: qty 3

## 2016-05-18 MED ORDER — SODIUM CHLORIDE 0.9 % IV SOLN
INTRAVENOUS | Status: DC
  Administered 2016-05-18: 12:00:00 via INTRAVENOUS
  Filled 2016-05-18: qty 1000

## 2016-05-18 MED ORDER — FENTANYL CITRATE (PF) 100 MCG/2ML IJ SOLN
INTRAMUSCULAR | Status: DC | PRN
  Administered 2016-05-18: 25 ug via INTRAVENOUS

## 2016-05-18 MED ORDER — OXYCODONE HCL 5 MG PO TABS: 5.0000 mg | ORAL_TABLET | ORAL | 0 refills | Status: DC | PRN

## 2016-05-18 MED ORDER — MEPERIDINE HCL 25 MG/ML IJ SOLN
6.2500 mg | INTRAMUSCULAR | Status: DC | PRN
  Filled 2016-05-18: qty 1

## 2016-05-18 MED ORDER — FENTANYL CITRATE (PF) 100 MCG/2ML IJ SOLN
25.0000 ug | INTRAMUSCULAR | Status: DC | PRN
  Filled 2016-05-18: qty 1

## 2016-05-18 MED ORDER — PHENYLEPHRINE HCL 10 MG/ML IJ SOLN
INTRAMUSCULAR | Status: DC | PRN
  Administered 2016-05-18 (×4): 10 ug via INTRAVENOUS

## 2016-05-18 MED ORDER — ACETAMINOPHEN 650 MG RE SUPP
650.0000 mg | RECTAL | Status: DC | PRN
  Filled 2016-05-18: qty 1

## 2016-05-18 MED ORDER — PROPOFOL 500 MG/50ML IV EMUL
INTRAVENOUS | Status: DC | PRN
  Administered 2016-05-18: 200 ug/kg/min via INTRAVENOUS

## 2016-05-18 MED ORDER — MIDAZOLAM HCL 2 MG/2ML IJ SOLN
0.5000 mg | Freq: Once | INTRAMUSCULAR | Status: DC | PRN
  Filled 2016-05-18: qty 2

## 2016-05-18 MED ORDER — OXYCODONE HCL 5 MG PO TABS
5.0000 mg | ORAL_TABLET | Freq: Once | ORAL | Status: AC
  Administered 2016-05-18: 5 mg via ORAL
  Filled 2016-05-18: qty 1

## 2016-05-18 MED ORDER — OXYCODONE HCL 5 MG PO TABS
ORAL_TABLET | ORAL | Status: AC
  Filled 2016-05-18: qty 1

## 2016-05-18 MED ORDER — SODIUM CHLORIDE 0.9 % IV SOLN
250.0000 mL | INTRAVENOUS | Status: DC | PRN
  Filled 2016-05-18: qty 250

## 2016-05-18 MED ORDER — SODIUM CHLORIDE 0.9% FLUSH
3.0000 mL | INTRAVENOUS | Status: DC | PRN
  Filled 2016-05-18: qty 3

## 2016-05-18 SURGICAL SUPPLY — 68 items
APL SKNCLS STERI-STRIP NONHPOA (GAUZE/BANDAGES/DRESSINGS) ×2
BARD PEZZER MODEL DRAIN ×3 IMPLANT
BENZOIN TINCTURE PRP APPL 2/3 (GAUZE/BANDAGES/DRESSINGS) ×4 IMPLANT
BLADE HEX COATED 2.75 (ELECTRODE) ×4 IMPLANT
BLADE SURG 10 STRL SS (BLADE) IMPLANT
BLADE SURG 15 STRL LF DISP TIS (BLADE) ×2 IMPLANT
BLADE SURG 15 STRL SS (BLADE) ×4
BRIEF STRETCH FOR OB PAD LRG (UNDERPADS AND DIAPERS) ×4 IMPLANT
CANISTER SUCTION 2500CC (MISCELLANEOUS) ×4 IMPLANT
CLEANER CAUTERY TIP 5X5 PAD (MISCELLANEOUS) ×1 IMPLANT
COVER BACK TABLE 60X90IN (DRAPES) ×4 IMPLANT
COVER MAYO STAND STRL (DRAPES) ×4 IMPLANT
DECANTER SPIKE VIAL GLASS SM (MISCELLANEOUS) ×4 IMPLANT
DRAPE LAPAROTOMY 100X72 PEDS (DRAPES) ×4 IMPLANT
DRAPE LG THREE QUARTER DISP (DRAPES) IMPLANT
DRAPE UNDERBUTTOCKS STRL (DRAPE) IMPLANT
DRAPE UTILITY XL STRL (DRAPES) ×4 IMPLANT
DRSG PAD ABDOMINAL 8X10 ST (GAUZE/BANDAGES/DRESSINGS) ×3 IMPLANT
ELECT BLADE 6.5 .24CM SHAFT (ELECTRODE) IMPLANT
ELECT REM PT RETURN 9FT ADLT (ELECTROSURGICAL) ×4
ELECTRODE REM PT RTRN 9FT ADLT (ELECTROSURGICAL) ×2 IMPLANT
GAUZE SPONGE 4X4 16PLY XRAY LF (GAUZE/BANDAGES/DRESSINGS) ×4 IMPLANT
GAUZE VASELINE 3X9 (GAUZE/BANDAGES/DRESSINGS) IMPLANT
GLOVE BIO SURGEON STRL SZ 6.5 (GLOVE) ×5 IMPLANT
GLOVE BIO SURGEONS STRL SZ 6.5 (GLOVE) ×2
GLOVE BIOGEL PI IND STRL 6.5 (GLOVE) ×1 IMPLANT
GLOVE BIOGEL PI IND STRL 7.5 (GLOVE) ×1 IMPLANT
GLOVE BIOGEL PI INDICATOR 6.5 (GLOVE) ×2
GLOVE BIOGEL PI INDICATOR 7.5 (GLOVE) ×2
GLOVE INDICATOR 7.0 STRL GRN (GLOVE) ×4 IMPLANT
GOWN STRL REUS W/ TWL LRG LVL3 (GOWN DISPOSABLE) ×2 IMPLANT
GOWN STRL REUS W/ TWL XL LVL3 (GOWN DISPOSABLE) ×2 IMPLANT
GOWN STRL REUS W/TWL 2XL LVL3 (GOWN DISPOSABLE) ×4 IMPLANT
GOWN STRL REUS W/TWL LRG LVL3 (GOWN DISPOSABLE) ×7 IMPLANT
GOWN STRL REUS W/TWL XL LVL3 (GOWN DISPOSABLE)
HEMOSTAT SURGICEL 2X14 (HEMOSTASIS) ×1 IMPLANT
HYDROGEN PEROXIDE 16OZ (MISCELLANEOUS) IMPLANT
IV CATH 18G SAFETY (IV SOLUTION) ×3 IMPLANT
KIT ROOM TURNOVER WOR (KITS) ×4 IMPLANT
LEGGING LITHOTOMY PAIR STRL (DRAPES) IMPLANT
LOOP VESSEL MAXI BLUE (MISCELLANEOUS) ×4 IMPLANT
NDL HYPO 25X1 1.5 SAFETY (NEEDLE) ×1 IMPLANT
NDL SAFETY ECLIPSE 18X1.5 (NEEDLE) IMPLANT
NEEDLE HYPO 18GX1.5 SHARP (NEEDLE)
NEEDLE HYPO 25X1 1.5 SAFETY (NEEDLE) ×4 IMPLANT
NS IRRIG 500ML POUR BTL (IV SOLUTION) ×4 IMPLANT
PACK BASIN DAY SURGERY FS (CUSTOM PROCEDURE TRAY) ×4 IMPLANT
PAD ABD 8X10 STRL (GAUZE/BANDAGES/DRESSINGS) ×4 IMPLANT
PAD ARMBOARD 7.5X6 YLW CONV (MISCELLANEOUS) ×4 IMPLANT
PAD CLEANER CAUTERY TIP 5X5 (MISCELLANEOUS) ×2
PENCIL BUTTON HOLSTER BLD 10FT (ELECTRODE) ×4 IMPLANT
SPONGE GAUZE 4X4 12PLY STER LF (GAUZE/BANDAGES/DRESSINGS) ×3 IMPLANT
SPONGE SURGIFOAM ABS GEL 12-7 (HEMOSTASIS) IMPLANT
SUT CHROMIC 2 0 SH (SUTURE) IMPLANT
SUT CHROMIC 3 0 SH 27 (SUTURE) IMPLANT
SUT ETHIBOND 0 (SUTURE) IMPLANT
SUT ETHILON 2 0 PS N (SUTURE) ×3 IMPLANT
SUT VIC AB 2-0 SH 27 (SUTURE)
SUT VIC AB 2-0 SH 27XBRD (SUTURE) IMPLANT
SUT VIC AB 3-0 SH 18 (SUTURE) ×3 IMPLANT
SUT VIC AB 4-0 P-3 18XBRD (SUTURE) IMPLANT
SUT VIC AB 4-0 P3 18 (SUTURE)
SYR CONTROL 10ML LL (SYRINGE) ×7 IMPLANT
TOWEL OR 17X24 6PK STRL BLUE (TOWEL DISPOSABLE) ×8 IMPLANT
TRAY DSU PREP LF (CUSTOM PROCEDURE TRAY) ×4 IMPLANT
TUBE CONNECTING 12'X1/4 (SUCTIONS) ×1
TUBE CONNECTING 12X1/4 (SUCTIONS) ×3 IMPLANT
YANKAUER SUCT BULB TIP NO VENT (SUCTIONS) ×4 IMPLANT

## 2016-05-18 NOTE — H&P (View-Only) (Signed)
History of Present Illness Jeffrey Ruff MD; [id number] 9:26 AM) The patient is a 65 year old male who presents with anal fistula. 65 year old male who presents to my office as a referral from [name] for a nonhealing perianal wound. He states that over the past 3 months he has had difficulty with an abscess in his perirectal area. This has been drained before and continues to drain the purulence and clear bloody fluid. He has tried antibiotics as well with no change in his symptoms. He has never had any anorectal procedures before. He does have renal cell carcinoma and is on by mouth chemotherapy for this.   Other Problems Jeffrey Trujillo, [name]; 04/20/2016 9:01 AM) Alcohol Abuse Anxiety Disorder Diverticulosis Gastroesophageal Reflux Disease Inguinal Hernia Umbilical Hernia Repair  Past Surgical History Jeffrey Trujillo, [name]; 04/20/2016 9:01 AM) Nephrectomy Right. Open Inguinal Hernia Surgery Left. Oral Surgery  Allergies Jeffrey Trujillo, [name]; 04/20/2016 9:01 AM) No Known Drug Allergies 04/20/2016  Medication History Jeffrey Trujillo, [name]; 04/20/2016 9:02 AM) Cabometyx (40MG  Tablet, Oral) Active. Protonix (40MG  Tablet DR, Oral) Active. Topamax (25MG  Tablet, Oral) Active. Medications Reconciled  Social History Jeffrey Trujillo, [name]; 04/20/2016 9:01 AM) Caffeine use Carbonated beverages.  Family History Jeffrey Trujillo, [name]; 04/20/2016 9:01 AM) Prostate Cancer Brother. Respiratory Condition Brother.     Review of Systems Jeffrey Trujillo [name]; 04/20/2016 9:01 AM) General Present- Chills. Not Present- Appetite Loss, Fatigue, Fever, Night Sweats, Weight Gain and Weight Loss. HEENT Present- Earache, Ringing in the Ears and Wears glasses/contact lenses. Not Present- Hearing Loss, Hoarseness, Nose Bleed, Oral Ulcers, Seasonal Allergies, Sinus Pain, Sore Throat, Visual Disturbances and Yellow Eyes. Gastrointestinal Present- Constipation, Indigestion and Rectal Pain. Not Present- Abdominal Pain,  Bloating, Bloody Stool, Change in Bowel Habits, Chronic diarrhea, Difficulty Swallowing, Excessive gas, Gets full quickly at meals, Hemorrhoids, Nausea and Vomiting. Male Genitourinary Not Present- Blood in Urine, Change in Urinary Stream, Frequency, Impotence, Nocturia, Painful Urination, Urgency and Urine Leakage.  Vitals Jeffrey Trujillo [name]; 04/20/2016 9:03 AM) 04/20/2016 9:02 AM Weight: 157 lb Height: 71in Body Surface Area: 1.9 m Body Mass Index: 21.9 kg/m  Temp.: 97.78F(Temporal)  Pulse: 98 (Regular)  BP: 128/80 (Sitting, Left Arm, Standard)      Physical Exam Jeffrey Ruff MD; [id number] 9:24 AM)  General Mental Status-Alert. General Appearance-Not in acute distress. Build & Nutrition-Well nourished. Posture-Normal posture. Gait-Normal.  Head and Neck Head-normocephalic, atraumatic with no lesions or palpable masses. Trachea-midline.  Chest and Lung Exam Chest and lung exam reveals -on auscultation, normal breath sounds, no adventitious sounds and normal vocal resonance.  Cardiovascular Cardiovascular examination reveals -normal heart sounds, regular rate and rhythm with no murmurs.  Abdomen Inspection Inspection of the abdomen reveals - No Hernias. Palpation/Percussion Palpation and Percussion of the abdomen reveal - Soft, Non Tender, No Rigidity (guarding), No hepatosplenomegaly and No Palpable abdominal masses.  Rectal Note: R anteriolateral external opening c/w fistula disease  Neurologic Neurologic evaluation reveals -alert and oriented x 3 with no impairment of recent or remote memory, normal attention span and ability to concentrate, normal sensation and normal coordination.  Musculoskeletal Normal Exam - Bilateral-Upper Extremity Strength Normal and Lower Extremity Strength Normal.    Assessment & Plan Jeffrey Ruff MD; [id number] 9:27 AM)  ANAL FISTULA (K60.3) Impression: 65 year old male with classic signs and symptoms  of an anal fistula. I have recommended that he undergo an anal exam under anesthesia with fistulotomy versus seton placement depending on the fistula tract. I will touch base with his oncologist prior to surgery. We  discussed the risk of surgery which are bleeding, infection, prolonged healing, recurrence and a small chance of incontinence.

## 2016-05-18 NOTE — Anesthesia Preprocedure Evaluation (Addendum)
Anesthesia Evaluation  Patient identified by MRN, date of birth, ID band Patient awake    Reviewed: Allergy & Precautions, H&P , Patient's Chart, lab work & pertinent test results, reviewed documented beta blocker date and time   Airway Mallampati: II  TM Distance: >3 FB Neck ROM: full    Dental no notable dental hx.    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     Musculoskeletal   Abdominal   Peds  Hematology  (+) Blood dyscrasia (thrombocytopenia: plt 33k), ,   Anesthesia Other Findings   Reproductive/Obstetrics                            Anesthesia Physical Anesthesia Plan  ASA: III  Anesthesia Plan: MAC   Post-op Pain Management:    Induction: Intravenous  Airway Management Planned: Mask and Natural Airway  Additional Equipment:   Intra-op Plan:   Post-operative Plan:   Informed Consent: I have reviewed the patients History and Physical, chart, labs and discussed the procedure including the risks, benefits and alternatives for the proposed anesthesia with the patient or authorized representative who has indicated his/her understanding and acceptance.   Dental Advisory Given  Plan Discussed with: CRNA and Surgeon  Anesthesia Plan Comments: (Discussed sedation and potential to need to place airway or ETT if warranted by clinical changes intra-operatively. We will start procedure as MAC.)        Anesthesia Quick Evaluation

## 2016-05-18 NOTE — Transfer of Care (Signed)
Immediate Anesthesia Transfer of Care Note  Patient: Jeffrey Trujillo  Procedure(s) Performed: Procedure(s): ANAL EXAM UNDER ANESTHESIA/ DRAIN TO ABSCESSED CAVITY (N/A)  Patient Location: PACU  Anesthesia Type:MAC  Level of Consciousness: awake, alert , oriented and patient cooperative  Airway & Oxygen Therapy: Patient Spontanous Breathing and Patient connected to nasal cannula oxygen  Post-op Assessment: Report given to RN and Post -op Vital signs reviewed and stable  Post vital signs: Reviewed and stable  Last Vitals:  Vitals:   05/18/16 1022  BP: 105/62  Pulse: 80  Resp: 16  Temp: 36.9 C    Last Pain:  Vitals:   05/18/16 1022  TempSrc: Oral      Patients Stated Pain Goal: 7 ([phone] [id number])  Complications: No apparent anesthesia complications

## 2016-05-18 NOTE — Anesthesia Procedure Notes (Signed)
Procedure Name: MAC Date/Time: 05/18/2016 1:35 PM Performed by: Wanita Chamberlain Pre-anesthesia Checklist: Patient identified, Timeout performed, Emergency Drugs available, Suction available and Patient being monitored Patient Re-evaluated:Patient Re-evaluated prior to inductionOxygen Delivery Method: Nasal cannula Intubation Type: IV induction Placement Confirmation: CO2 detector

## 2016-05-18 NOTE — Discharge Instructions (Addendum)

## 2016-05-18 NOTE — Op Note (Signed)
05/18/2016  2:30 PM  PATIENT:  Jeffrey Trujillo  65 y.o. male  Patient Care Team: [facility] as PCP - General [name], MD as Consulting Physician (Gastroenterology)  PRE-OPERATIVE DIAGNOSIS:  Anal Fistula  POST-OPERATIVE DIAGNOSIS:  Chronic Anal Abscess  PROCEDURE:  ANAL EXAM UNDER ANESTHESIA/ DRAIN PLACEMENT TO ABSCESS CAVITY    Surgeon(s): Leighton Ruff, MD  ASSISTANT: none   ANESTHESIA:   local and MAC  SPECIMEN:  No Specimen  DISPOSITION OF SPECIMEN:  N/A  COUNTS:  YES  PLAN OF CARE: Discharge to home after PACU  PATIENT DISPOSITION:  PACU - hemodynamically stable.  INDICATION: 65 y.o. M with Metastatic RCC.  He has a chronically draining anal wound.     OR FINDINGS: Right lateral deep perianal abscess  DESCRIPTION: the patient was identified in the preoperative holding area and taken to the OR where they were laid on the operating room table.  MAC anesthesia was induced without difficulty. The patient was then positioned in prone jackknife position with buttocks gently taped apart.  The patient was then prepped and draped in usual sterile fashion.  SCDs were noted to be in place prior to the initiation of anesthesia. A surgical timeout was performed indicating the correct patient, procedure, positioning and need for preoperative antibiotics.  A rectal block was performed using Marcaine with epinephrine.    I began with a digital rectal exam.  There were no obvious defects or masses noted.  I then placed a Hill-Ferguson anoscope into the anal canal and evaluated this completely.  There was an area of previous scar in the anterior perianal region but no signs of internal fistula. I placed a fistula probe into the defect on the right lateral perianal region and this easily passed into a large cavity underneath. I unroofed a part of the skin around this cavity and was able to palpate a large wound that tracked approximately 8 cm up along the  ischioorectal space.  The cavity was relatively clean but with minimal signs of wound healing. I injected the cavity with hydrogen peroxide and confirmed no patency to the anal canal. I then placed a 22 French mushroom catheter into the cavity and secured this to the skin using 2, 2-0 nylon sutures. The remaining portion of the wound was closed using 3-0 chromic suture. A dressing was applied over this. The patient was awakened from anesthesia and sent to the postanesthesia care unit in stable condition. All counts were correct per operating room staff.

## 2016-05-18 NOTE — Interval H&P Note (Signed)
History and Physical Interval Note:  05/18/2016 1:12 PM  Jeffrey Trujillo  has presented today for surgery, with the diagnosis of Anal Fistula  The various methods of treatment have been discussed with the patient and family. After consideration of risks, benefits and other options for treatment, the patient has consented to  Procedure(s): ANAL EXAM UNDER ANESTHESIA (N/A) POSSIBLE SETON PLACEMENT, POSSIBLE FISTULOTOMY (N/A) as a surgical intervention .  The patient's history has been reviewed, patient examined, no change in status, stable for surgery.  I have reviewed the patient's chart and labs.  Questions were answered to the patient's satisfaction.     [name].

## 2016-05-21 NOTE — Anesthesia Postprocedure Evaluation (Signed)
Anesthesia Post Note  Patient: Jeffrey Trujillo  Procedure(s) Performed: Procedure(s) (LRB): ANAL EXAM UNDER ANESTHESIA/ DRAIN TO ABSCESSED CAVITY (N/A)  Patient location during evaluation: PACU Anesthesia Type: MAC Level of consciousness: awake and alert Pain management: pain level controlled Vital Signs Assessment: post-procedure vital signs reviewed and stable Respiratory status: spontaneous breathing, nonlabored ventilation, respiratory function stable and patient connected to nasal cannula oxygen Cardiovascular status: stable and blood pressure returned to baseline Anesthetic complications: no    Last Vitals:  Vitals:   05/18/16 1500 05/18/16 1605  BP: (!) 108/55 (!) 145/65  Pulse: 66 72  Resp: (!) 8 14  Temp:  36.6 C    Last Pain:  Vitals:   05/18/16 1605  TempSrc:   PainSc: 3                  Amirrah Quigley EDWARD

## 2016-06-12 ENCOUNTER — Ambulatory Visit (HOSPITAL_BASED_OUTPATIENT_CLINIC_OR_DEPARTMENT_OTHER): Payer: BLUE CROSS/BLUE SHIELD | Admitting: Oncology

## 2016-06-12 ENCOUNTER — Telehealth: Payer: Self-pay | Admitting: Oncology

## 2016-06-12 ENCOUNTER — Other Ambulatory Visit (HOSPITAL_BASED_OUTPATIENT_CLINIC_OR_DEPARTMENT_OTHER): Payer: BLUE CROSS/BLUE SHIELD

## 2016-06-12 VITALS — BP 123/66 | HR 82 | Temp 98.1°F | Resp 18 | Ht 71.0 in | Wt 165.0 lb

## 2016-06-12 DIAGNOSIS — D696 Thrombocytopenia, unspecified: Secondary | ICD-10-CM | POA: Diagnosis not present

## 2016-06-12 DIAGNOSIS — C642 Malignant neoplasm of left kidney, except renal pelvis: Secondary | ICD-10-CM

## 2016-06-12 DIAGNOSIS — C649 Malignant neoplasm of unspecified kidney, except renal pelvis: Secondary | ICD-10-CM

## 2016-06-12 DIAGNOSIS — D509 Iron deficiency anemia, unspecified: Secondary | ICD-10-CM

## 2016-06-12 DIAGNOSIS — N289 Disorder of kidney and ureter, unspecified: Secondary | ICD-10-CM

## 2016-06-12 LAB — COMPREHENSIVE METABOLIC PANEL
ALBUMIN: 2.9 g/dL — AB (ref 3.5–5.0)
ALK PHOS: 94 U/L (ref 40–150)
ALT: 10 U/L (ref 0–55)
ANION GAP: 10 meq/L (ref 3–11)
AST: 17 U/L (ref 5–34)
BILIRUBIN TOTAL: 0.46 mg/dL (ref 0.20–1.20)
BUN: 18.3 mg/dL (ref 7.0–26.0)
CO2: 25 mEq/L (ref 22–29)
Calcium: 9.4 mg/dL (ref 8.4–10.4)
Chloride: 107 mEq/L (ref 98–109)
Creatinine: 1.7 mg/dL — ABNORMAL HIGH (ref 0.7–1.3)
EGFR: 43 mL/min/{1.73_m2} — AB (ref >90)
GLUCOSE: 84 mg/dL (ref 70–140)
POTASSIUM: 5 meq/L (ref 3.5–5.1)
SODIUM: 142 meq/L (ref 136–145)
TOTAL PROTEIN: 7.2 g/dL (ref 6.4–8.3)

## 2016-06-12 LAB — CBC WITH DIFFERENTIAL/PLATELET
BASO%: 0.8 % (ref 0.0–2.0)
BASOS ABS: 0 10*3/uL (ref 0.0–0.1)
EOS%: 6.3 % (ref 0.0–7.0)
Eosinophils Absolute: 0.4 10*3/uL (ref 0.0–0.5)
HCT: 26.8 % — ABNORMAL LOW (ref 38.4–49.9)
HEMOGLOBIN: 8.6 g/dL — AB (ref 13.0–17.1)
LYMPH%: 11.4 % — AB (ref 14.0–49.0)
MCH: 28.9 pg (ref 27.2–33.4)
MCHC: 32.2 g/dL (ref 32.0–36.0)
MCV: 89.8 fL (ref 79.3–98.0)
MONO#: 0.4 10*3/uL (ref 0.1–0.9)
MONO%: 7 % (ref 0.0–14.0)
NEUT#: 4.5 10*3/uL (ref 1.5–6.5)
NEUT%: 74.5 % (ref 39.0–75.0)
PLATELETS: 262 10*3/uL (ref 140–400)
RBC: 2.98 10*6/uL — ABNORMAL LOW (ref 4.20–5.82)
RDW: 18.1 % — AB (ref 11.0–14.6)
WBC: 6.1 10*3/uL (ref 4.0–10.3)
lymph#: 0.7 10*3/uL — ABNORMAL LOW (ref 0.9–3.3)

## 2016-06-12 NOTE — Telephone Encounter (Signed)
Gave relative avs report and appointments for November date/time per relative needs Friday around 12:30 pm.

## 2016-06-12 NOTE — Progress Notes (Signed)
Hematology and Oncology Follow Up Visit  KIEGAN MANGINO [id number] 06/12/16     Principle Diagnosis: 65 year old with the following issues:  1. T1b right kidney cancer diagnosed in 2010. He is S/P rdical nephrectomy on June 27, 2009. The pathological staging was T1b clear cell histology with Fuhrman grade 3/4.  2. Left Kidney mass  Measuring 4.5 x 2 7 x 3.6 irregular enhancing appears to be a new kidney cancer noted in 07/2012. 3. Pancreatic lesion. 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 4. Votrient  800 mg daily started in December 2013. Treatment held in 11/2012 due to increase LFTs. Votrient was resumed in April 2014 at 400 mg daily. His dose was reduced further to 200 mg daily starting in September 2016. Therapy discontinued in June 2017 because of progression of disease.   Current therapy: Cabometyx 40 mg daily started in July 2017.  Interim History: [name] presents for a follow up visit. Since his last visit, he underwent abscess drainage and placement of a drain on 05/18/2016. I asked him to hold his anticancer therapy to he is fully healed from his perirectal abscess. Since his operation, he reports some improvement in his overall health and decrease in the drainage from his abscess. He feels that he still have some pain and drainage but has improved.  His appetite remained reasonable and have actually improved and he gained weight. He denied any bleeding complications.  He is not having any fevers or chills. He did not report any headaches blurred vision double vision. Does not report any motor sensory neuropathy or alteration of mental status. He does not report any depression or anxiety. He does not report any frequency urgency or hesitancy. He does not report any other urinary symptoms. Remainder of his review of systems is unremarkable.  Medications: Unchanged by my review today. Current Outpatient Prescriptions  Medication Sig Dispense Refill  .  acetaminophen (TYLENOL) 650 MG CR tablet Take 650 mg by mouth every 8 (eight) hours as needed for pain.    Marland Kitchen CABOMETYX 40 MG TABS TAKE 1 TABLET BY MOUTH ONE TIME DAILY WITH AT LEAST 8 OZ OF WATER ON AN EMPTY STOMACH. DO NOT EAT FOR 2 HOURS BEFORE OR 1 HOUR AFTER. SWALLO 30 tablet 5  . diphenhydrAMINE (BENADRYL) 25 MG tablet Take 25 mg by mouth daily as needed for itching. Reported on 02/26/2016    . docusate sodium (COLACE) 100 MG capsule Take 1 capsule (100 mg total) by mouth every 12 (twelve) hours. 60 capsule 0  . indomethacin (INDOCIN) 50 MG capsule Take 40 mg by mouth every 8 (eight) hours as needed for moderate pain.    Marland Kitchen oxyCODONE (OXY IR/ROXICODONE) 5 MG immediate release tablet Take 1-2 tablets (5-10 mg total) by mouth every 4 (four) hours as needed. 25 tablet 0  . pantoprazole (PROTONIX) 40 MG tablet Take 1 tablet (40 mg total) by mouth 2 (two) times daily before a meal. 60 tablet 5  . topiramate (TOPAMAX) 25 MG tablet Take 25 mg by mouth 2 (two) times daily.    . traMADol (ULTRAM) 50 MG tablet Take 50 mg by mouth every 6 (six) hours as needed.     No current facility-administered medications for this visit.      Allergies:  Allergies  Allergen Reactions  . Nsaids Other (See Comments)    Patient only has one kidney.   . Meperidine Hcl Hives and Rash    Past Medical History, Surgical history, Social  history, and Family History were reviewed and updated.   Physical Exam: Blood pressure 123/66, pulse 82, temperature 98.1 F (36.7 C), temperature source Oral, resp. rate 18, height 5\' 11"  (1.803 m), weight 165 lb (74.8 kg), SpO2 100 %. ECOG: 1 General appearance: Well-appearing gentleman without distress. Head: No oral ulcers or lesions. Sclerae anicteric. No oral ulcers or lesions. Neck: no adenopathy, no masses Lymph nodes: Cervical, supraclavicular, and axillary nodes normal. Heart:regular rate and rhythm, S1, S2.  Lung:chest clear, no wheezing, or dullness to  percussion. Abdomen: soft, non-tender, without masses or organomegaly. No rebound or guarding. EXT: bilateral edema noted. Neurological exam: no motor or sensory deficits noted. Skin: No rashes or lesions noted. no petechiae or ecchymosis.  Neurological: Intact no deficits. No motor or sensory deficits.  CBC    Component Value Date/Time   WBC 6.1 06/12/2016 1411   WBC 5.4 03/19/2016 1636   RBC 2.98 (L) 06/12/2016 1411   RBC 2.66 (L) 03/19/2016 1636   HGB 8.6 (L) 06/12/2016 1411   HCT 26.8 (L) 06/12/2016 1411   PLT 262 06/12/2016 1411   MCV 89.8 06/12/2016 1411   MCH 28.9 06/12/2016 1411   MCH 32.7 03/19/2016 1636   MCHC 32.2 06/12/2016 1411   MCHC 32.6 03/19/2016 1636   RDW 18.1 (H) 06/12/2016 1411   LYMPHSABS 0.7 (L) 06/12/2016 1411   MONOABS 0.4 06/12/2016 1411   EOSABS 0.4 06/12/2016 1411   BASOSABS 0.0 06/12/2016 1411       Impression and Plan:   A 65 year old gentleman with the following issues:   1. A history of renal cell carcinoma. He had stage T1b diagnosed in October 2010 of the right kidney, status post right nephrectomy. He also has left kidney cancer and pancreatic metastasis. He was initially started on Votrient in December 2013. He had dose reduction on few occasions. The patient currently on Votrient 200 mg daily since September 2016 dose reduction.  CT scan obtained on 02/26/2016 was reviewed and showed slow progression of disease especially in his left renal mass.   He is currently on Cabometyx but has been on hold since July 2017 due to her thrombocytopenia and his recurrent abscess. This will be resumed in the future once his abscess is healed.   2. Pancreatic lesion. Biopsy proven to be malignant and likely represent renal cell cancer. It appears less prominent at this time.  3. Thrombocytopenia: Platelet count back to normal off treatment.  4. Anemia.  Related to iron deficiency as well as renal insufficiency. Hemoglobin decreased recently likely  related to his recent infection and surgery.  5. Increase LFTs:  We will continue to monitor this moving forward. His most recent LFTs were within normal range.  6. Weight loss: Improved since the last visit.  7. Perirectal abscess: S/P 05/18/2016 and continues to follow with Dr. Marcello Moores.  8. Follow-up:  4 weeks for evaluation.  [name] MD 06/12/16

## 2016-07-20 ENCOUNTER — Other Ambulatory Visit: Payer: BLUE CROSS/BLUE SHIELD

## 2016-07-20 ENCOUNTER — Ambulatory Visit: Payer: BLUE CROSS/BLUE SHIELD | Admitting: Oncology

## 2016-07-23 ENCOUNTER — Telehealth: Payer: Self-pay | Admitting: Oncology

## 2016-07-23 NOTE — Telephone Encounter (Signed)
Appointments rescheduled per patient request. Patient missed 11/3 appointment by mistake. The patient thought that his appointment was scheduled for 11/6 instead. Scheduled for next available.

## 2016-07-24 ENCOUNTER — Telehealth: Payer: Self-pay | Admitting: Oncology

## 2016-07-24 ENCOUNTER — Ambulatory Visit: Payer: BLUE CROSS/BLUE SHIELD | Admitting: Oncology

## 2016-07-24 ENCOUNTER — Other Ambulatory Visit: Payer: BLUE CROSS/BLUE SHIELD

## 2016-07-24 NOTE — Telephone Encounter (Signed)
Spoke with wife twice to confirm appointment for 11/9. The wife stated that the patient wasn't feeling well and couldn't remember appointment.

## 2016-07-26 ENCOUNTER — Telehealth: Payer: Self-pay | Admitting: Oncology

## 2016-07-26 ENCOUNTER — Other Ambulatory Visit: Payer: BLUE CROSS/BLUE SHIELD

## 2016-07-26 ENCOUNTER — Ambulatory Visit: Payer: BLUE CROSS/BLUE SHIELD | Admitting: Oncology

## 2016-07-26 NOTE — Telephone Encounter (Signed)
11/9 appointments canceled Per patient request. Patient not feeling well. Will call back to reschedule.

## 2016-08-01 ENCOUNTER — Telehealth: Payer: Self-pay | Admitting: Oncology

## 2016-08-01 NOTE — Telephone Encounter (Signed)
Patient called to have appointment rescheduled for 11/16. Spoke with nurse to verify it was okay for patient to come 11/16 in the afternoon. Patient scheduled for 11/16@3PM 

## 2016-08-02 ENCOUNTER — Telehealth: Payer: Self-pay | Admitting: Oncology

## 2016-08-02 ENCOUNTER — Other Ambulatory Visit (HOSPITAL_BASED_OUTPATIENT_CLINIC_OR_DEPARTMENT_OTHER): Payer: BLUE CROSS/BLUE SHIELD

## 2016-08-02 ENCOUNTER — Ambulatory Visit (HOSPITAL_BASED_OUTPATIENT_CLINIC_OR_DEPARTMENT_OTHER): Payer: BLUE CROSS/BLUE SHIELD | Admitting: Oncology

## 2016-08-02 VITALS — BP 163/70 | HR 83 | Temp 98.8°F | Resp 18 | Ht 71.0 in | Wt 170.7 lb

## 2016-08-02 DIAGNOSIS — C649 Malignant neoplasm of unspecified kidney, except renal pelvis: Secondary | ICD-10-CM

## 2016-08-02 DIAGNOSIS — C642 Malignant neoplasm of left kidney, except renal pelvis: Secondary | ICD-10-CM

## 2016-08-02 DIAGNOSIS — D696 Thrombocytopenia, unspecified: Secondary | ICD-10-CM | POA: Diagnosis not present

## 2016-08-02 DIAGNOSIS — C7889 Secondary malignant neoplasm of other digestive organs: Secondary | ICD-10-CM

## 2016-08-02 DIAGNOSIS — D509 Iron deficiency anemia, unspecified: Secondary | ICD-10-CM | POA: Diagnosis not present

## 2016-08-02 LAB — CBC WITH DIFFERENTIAL/PLATELET
BASO%: 0.2 % (ref 0.0–2.0)
Basophils Absolute: 0 10*3/uL (ref 0.0–0.1)
EOS ABS: 0 10*3/uL (ref 0.0–0.5)
EOS%: 0.7 % (ref 0.0–7.0)
HCT: 29.5 % — ABNORMAL LOW (ref 38.4–49.9)
HGB: 9.3 g/dL — ABNORMAL LOW (ref 13.0–17.1)
LYMPH%: 7.5 % — AB (ref 14.0–49.0)
MCH: 28.7 pg (ref 27.2–33.4)
MCHC: 31.5 g/dL — AB (ref 32.0–36.0)
MCV: 91 fL (ref 79.3–98.0)
MONO#: 1 10*3/uL — AB (ref 0.1–0.9)
MONO%: 16.9 % — ABNORMAL HIGH (ref 0.0–14.0)
NEUT#: 4.5 10*3/uL (ref 1.5–6.5)
NEUT%: 74.7 % (ref 39.0–75.0)
PLATELETS: 122 10*3/uL — AB (ref 140–400)
RBC: 3.24 10*6/uL — AB (ref 4.20–5.82)
RDW: 17.2 % — ABNORMAL HIGH (ref 11.0–14.6)
WBC: 6 10*3/uL (ref 4.0–10.3)
lymph#: 0.5 10*3/uL — ABNORMAL LOW (ref 0.9–3.3)

## 2016-08-02 LAB — COMPREHENSIVE METABOLIC PANEL
ALT: 25 U/L (ref 0–55)
ANION GAP: 8 meq/L (ref 3–11)
AST: 22 U/L (ref 5–34)
Albumin: 3.2 g/dL — ABNORMAL LOW (ref 3.5–5.0)
Alkaline Phosphatase: 101 U/L (ref 40–150)
BILIRUBIN TOTAL: 0.57 mg/dL (ref 0.20–1.20)
BUN: 20.8 mg/dL (ref 7.0–26.0)
CHLORIDE: 109 meq/L (ref 98–109)
CO2: 24 meq/L (ref 22–29)
Calcium: 9.5 mg/dL (ref 8.4–10.4)
Creatinine: 1.4 mg/dL — ABNORMAL HIGH (ref 0.7–1.3)
EGFR: 52 mL/min/{1.73_m2} — AB (ref >90)
Glucose: 96 mg/dl (ref 70–140)
Potassium: 4.4 mEq/L (ref 3.5–5.1)
Sodium: 141 mEq/L (ref 136–145)
Total Protein: 7.4 g/dL (ref 6.4–8.3)

## 2016-08-02 NOTE — Progress Notes (Signed)
Hematology and Oncology Follow Up Visit  JERMAR FERRETT [id number] 08/02/16     Principle Diagnosis: 65 year old with the following issues:  1. T1b right kidney cancer diagnosed in 2010. He is S/P rdical nephrectomy on June 27, 2009. The pathological staging was T1b clear cell histology with Fuhrman grade 3/4.  2. Left Kidney mass  Measuring 4.5 x 2 7 x 3.6 irregular enhancing appears to be a new kidney cancer noted in 07/2012. 3. Pancreatic lesion. 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 4. Votrient  800 mg daily started in December 2013. Treatment held in 11/2012 due to increase LFTs. Votrient was resumed in April 2014 at 400 mg daily. His dose was reduced further to 200 mg daily starting in September 2016. Therapy discontinued in June 2017 because of progression of disease.   Current therapy: Cabometyx 40 mg daily started in July 2017. Therapy has been on hold since August of 2017 because of perianal abscess.  Interim History: Mr. Salahuddin presents for a follow up visit. Since his last visit, he continues to recover slowly from his abscess and not fully healed at this time. He continues to have rectal discharge and requires a pad changes multiple times a day. He denied any fevers, chills or hospitalizations. His appetite is excellent and have gained 5 pounds. He is ambulating without any difficulties and continues take 10 to activities of daily living. No changes bowel habits at this time. He denied any abdominal pain or dyspnea on exertion.  He is not having any fevers or chills. He did not report any headaches blurred vision double vision. Does not report any motor sensory neuropathy or alteration of mental status. He does not report any depression or anxiety. He does not report any frequency urgency or hesitancy. He does not report any other urinary symptoms. Remainder of his review of systems is unremarkable.  Medications: Unchanged by my review today. Current  Outpatient Prescriptions  Medication Sig Dispense Refill  . acetaminophen (TYLENOL) 650 MG CR tablet Take 650 mg by mouth every 8 (eight) hours as needed for pain.    Marland Kitchen CABOMETYX 40 MG TABS TAKE 1 TABLET BY MOUTH ONE TIME DAILY WITH AT LEAST 8 OZ OF WATER ON AN EMPTY STOMACH. DO NOT EAT FOR 2 HOURS BEFORE OR 1 HOUR AFTER. [facility] (Patient not taking: Reported on 08/02/2016) 30 tablet 5  . diphenhydrAMINE (BENADRYL) 25 MG tablet Take 25 mg by mouth daily as needed for itching. Reported on 02/26/2016    . docusate sodium (COLACE) 100 MG capsule Take 1 capsule (100 mg total) by mouth every 12 (twelve) hours. (Patient not taking: Reported on 08/02/2016) 60 capsule 0  . indomethacin (INDOCIN) 50 MG capsule Take 40 mg by mouth every 8 (eight) hours as needed for moderate pain.    . pantoprazole (PROTONIX) 40 MG tablet Take 1 tablet (40 mg total) by mouth 2 (two) times daily before a meal. (Patient not taking: Reported on 08/02/2016) 60 tablet 5  . topiramate (TOPAMAX) 25 MG tablet Take 25 mg by mouth 2 (two) times daily.    . traMADol (ULTRAM) 50 MG tablet Take 50 mg by mouth every 6 (six) hours as needed.     No current facility-administered medications for this visit.      Allergies:  Allergies  Allergen Reactions  . Nsaids Other (See Comments)    Patient only has one kidney.   . Meperidine Hcl Hives and Rash    Past Medical History,  Surgical history, Social history, and Family History were reviewed and updated.   Physical Exam: Blood pressure (!) 163/70, pulse 83, temperature 98.8 F (37.1 C), temperature source Oral, resp. rate 18, height 5\' 11"  (1.803 m), weight 170 lb 11.2 oz (77.4 kg), SpO2 100 %. ECOG: 1 General appearance: Alert, awake gentleman without distress. Head: No oral ulcers or lesions. Sclerae anicteric. No oral ulcers or lesions. Neck: no adenopathy, no masses Lymph nodes: Cervical, supraclavicular, and axillary nodes normal. Heart:regular rate and rhythm, S1, S2.   Lung:chest clear, no wheezing, or dullness to percussion. Abdomen: soft, non-tender, without masses or organomegaly. No shifting dullness or ascites. EXT: bilateral edema noted. Neurological exam: no motor or sensory deficits noted. Skin: No rashes or lesions noted. no petechiae or ecchymosis.  Neurological: Intact no deficits. No motor or sensory deficits.  CBC    Component Value Date/Time   WBC 6.0 08/02/2016 1321   WBC 5.4 03/19/2016 1636   RBC 3.24 (L) 08/02/2016 1321   RBC 2.66 (L) 03/19/2016 1636   HGB 9.3 (L) 08/02/2016 1321   HCT 29.5 (L) 08/02/2016 1321   PLT 122 (L) 08/02/2016 1321   MCV 91.0 08/02/2016 1321   MCH 28.7 08/02/2016 1321   MCH 32.7 03/19/2016 1636   MCHC 31.5 (L) 08/02/2016 1321   MCHC 32.6 03/19/2016 1636   RDW 17.2 (H) 08/02/2016 1321   LYMPHSABS 0.5 (L) 08/02/2016 1321   MONOABS 1.0 (H) 08/02/2016 1321   EOSABS 0.0 08/02/2016 1321   BASOSABS 0.0 08/02/2016 1321       Impression and Plan:   A 65 year old gentleman with the following issues:   1. A history of renal cell carcinoma. He had stage T1b diagnosed in October 2010 of the right kidney, status post right nephrectomy. He also has left kidney cancer and pancreatic metastasis. He was initially started on Votrient in December 2013. He had dose reduction on few occasions. The patient currently on Votrient 200 mg daily since September 2016 dose reduction.  CT scan obtained on 02/26/2016 showed slow progression of disease especially in his left renal mass.   He is currently on Cabometyx which has been on hold due to his anal abscess. The plan is to repeat imaging studies in December 2017 and restart therapy once he is fully healed.   2. Pancreatic lesion. Biopsy proven to be malignant and likely represent renal cell cancer. It appears less prominent at this time.  3. Thrombocytopenia: Platelet count back to normal off treatment.  4. Anemia.  Related to iron deficiency as well as renal  insufficiency. Hemoglobin is improving.  5. Increase LFTs:  We will continue to monitor this moving forward. His most recent LFTs were within normal range.  6. Weight loss: Improved since the last visit.  7. Perirectal abscess: S/P 05/18/2016 and continues to follow with Dr. Marcello Moores.  8. Follow-up:  In one month after a CT scan.  [name] MD 08/02/16

## 2016-08-02 NOTE — Telephone Encounter (Signed)
Appointments scheduled per 11/16 LOS. Patient given AVS report and calendars with future scheduled appointments. ° °

## 2016-08-24 ENCOUNTER — Encounter: Payer: Self-pay | Admitting: *Deleted

## 2016-08-31 ENCOUNTER — Ambulatory Visit (HOSPITAL_COMMUNITY)
Admission: RE | Admit: 2016-08-31 | Discharge: 2016-08-31 | Disposition: A | Discharge status: Home / Self Care | Payer: Medicare Other | Source: Ambulatory Visit | Attending: Oncology | Admitting: Oncology

## 2016-08-31 ENCOUNTER — Other Ambulatory Visit (HOSPITAL_BASED_OUTPATIENT_CLINIC_OR_DEPARTMENT_OTHER): Payer: Medicare Other

## 2016-08-31 DIAGNOSIS — C642 Malignant neoplasm of left kidney, except renal pelvis: Secondary | ICD-10-CM

## 2016-08-31 DIAGNOSIS — C649 Malignant neoplasm of unspecified kidney, except renal pelvis: Secondary | ICD-10-CM | POA: Insufficient documentation

## 2016-08-31 DIAGNOSIS — K8689 Other specified diseases of pancreas: Secondary | ICD-10-CM | POA: Diagnosis not present

## 2016-08-31 LAB — CBC WITH DIFFERENTIAL/PLATELET
BASO%: 0.2 % (ref 0.0–2.0)
BASOS ABS: 0 10*3/uL (ref 0.0–0.1)
EOS ABS: 0.1 10*3/uL (ref 0.0–0.5)
EOS%: 3.1 % (ref 0.0–7.0)
HEMATOCRIT: 30.3 % — AB (ref 38.4–49.9)
HEMOGLOBIN: 9.4 g/dL — AB (ref 13.0–17.1)
LYMPH#: 0.5 10*3/uL — AB (ref 0.9–3.3)
LYMPH%: 11.8 % — ABNORMAL LOW (ref 14.0–49.0)
MCH: 29 pg (ref 27.2–33.4)
MCHC: 31 g/dL — AB (ref 32.0–36.0)
MCV: 93.5 fL (ref 79.3–98.0)
MONO#: 0.5 10*3/uL (ref 0.1–0.9)
MONO%: 12.2 % (ref 0.0–14.0)
NEUT#: 3 10*3/uL (ref 1.5–6.5)
NEUT%: 72.7 % (ref 39.0–75.0)
PLATELETS: 72 10*3/uL — AB (ref 140–400)
RBC: 3.24 10*6/uL — ABNORMAL LOW (ref 4.20–5.82)
RDW: 19 % — AB (ref 11.0–14.6)
WBC: 4.2 10*3/uL (ref 4.0–10.3)

## 2016-08-31 LAB — COMPREHENSIVE METABOLIC PANEL
ALBUMIN: 3.3 g/dL — AB (ref 3.5–5.0)
ALK PHOS: 125 U/L (ref 40–150)
ALT: 26 U/L (ref 0–55)
ANION GAP: 8 meq/L (ref 3–11)
AST: 39 U/L — ABNORMAL HIGH (ref 5–34)
BUN: 26.6 mg/dL — ABNORMAL HIGH (ref 7.0–26.0)
CALCIUM: 9.2 mg/dL (ref 8.4–10.4)
CO2: 27 mEq/L (ref 22–29)
CREATININE: 1.7 mg/dL — AB (ref 0.7–1.3)
Chloride: 106 mEq/L (ref 98–109)
EGFR: 43 mL/min/{1.73_m2} — ABNORMAL LOW (ref >90)
Glucose: 98 mg/dl (ref 70–140)
POTASSIUM: 4.5 meq/L (ref 3.5–5.1)
Sodium: 141 mEq/L (ref 136–145)
Total Bilirubin: 0.77 mg/dL (ref 0.20–1.20)
Total Protein: 7.8 g/dL (ref 6.4–8.3)

## 2016-09-05 ENCOUNTER — Telehealth: Payer: Self-pay | Admitting: Oncology

## 2016-09-05 ENCOUNTER — Ambulatory Visit (HOSPITAL_BASED_OUTPATIENT_CLINIC_OR_DEPARTMENT_OTHER): Payer: Medicare Other | Admitting: Oncology

## 2016-09-05 VITALS — BP 146/68 | HR 88 | Temp 97.9°F | Resp 18 | Ht 71.0 in | Wt 176.9 lb

## 2016-09-05 DIAGNOSIS — D696 Thrombocytopenia, unspecified: Secondary | ICD-10-CM

## 2016-09-05 DIAGNOSIS — C649 Malignant neoplasm of unspecified kidney, except renal pelvis: Secondary | ICD-10-CM

## 2016-09-05 DIAGNOSIS — C641 Malignant neoplasm of right kidney, except renal pelvis: Secondary | ICD-10-CM

## 2016-09-05 DIAGNOSIS — D509 Iron deficiency anemia, unspecified: Secondary | ICD-10-CM | POA: Diagnosis not present

## 2016-09-05 DIAGNOSIS — N289 Disorder of kidney and ureter, unspecified: Secondary | ICD-10-CM

## 2016-09-05 NOTE — Telephone Encounter (Signed)
Appointments scheduled per 09/05/16 los. A copy of the appointment schedule was given to the patient, per 09/05/16 los.

## 2016-09-05 NOTE — Progress Notes (Signed)
Hematology and Oncology Follow Up Visit  Jeffrey Trujillo [id number] 09/05/16     Principle Diagnosis: 65 year old with the following issues:  1. T1b right kidney cancer diagnosed in 2010. He is S/P rdical nephrectomy on June 27, 2009. The pathological staging was T1b clear cell histology with Fuhrman grade 3/4.  2. Left Kidney mass  Measuring 4.5 x 2 7 x 3.6 irregular enhancing appears to be a new kidney cancer noted in 07/2012. 3. Pancreatic lesion. 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 4. Votrient  800 mg daily started in December 2013. Treatment held in 11/2012 due to increase LFTs. Votrient was resumed in April 2014 at 400 mg daily. His dose was reduced further to 200 mg daily starting in September 2016. Therapy discontinued in June 2017 because of progression of disease.   Current therapy: Cabometyx 40 mg daily started in July 2017. Therapy has been on hold since August of 2017 because of perianal abscess. This is to be resumed on 09/06/2016.  Interim History: Jeffrey Trujillo presents for a follow up visit. Since his last visit, he reports feeling reasonably well. His perianal abscess has healed well at this time and does not require any further intervention. He is no longer reporting any pain or discharge. He denied any fevers, chills or hospitalizations.   His appetite is excellent and have gained more weight. He is ambulating without any difficulties and attends to activities of daily living.   He is not having any fevers or chills. He did not report any headaches blurred vision double vision. Does not report any motor sensory neuropathy or alteration of mental status. He does not report any depression or anxiety. He does not report any frequency urgency or hesitancy. He does not report any other urinary symptoms. Remainder of his review of systems is unremarkable.  Medications: Unchanged by my review today. Current Outpatient Prescriptions  Medication Sig Dispense  Refill  . acetaminophen (TYLENOL) 650 MG CR tablet Take 650 mg by mouth every 8 (eight) hours as needed for pain.    . diphenhydrAMINE (BENADRYL) 25 MG tablet Take 25 mg by mouth daily as needed for itching. Reported on 02/26/2016    . docusate sodium (COLACE) 100 MG capsule Take 1 capsule (100 mg total) by mouth every 12 (twelve) hours. 60 capsule 0  . indomethacin (INDOCIN) 50 MG capsule Take 40 mg by mouth every 8 (eight) hours as needed for moderate pain.    Marland Kitchen topiramate (TOPAMAX) 25 MG tablet Take 25 mg by mouth 2 (two) times daily.    . traMADol (ULTRAM) 50 MG tablet Take 50 mg by mouth every 6 (six) hours as needed.    . CABOMETYX 40 MG TABS TAKE 1 TABLET BY MOUTH ONE TIME DAILY WITH AT LEAST 8 OZ OF WATER ON AN EMPTY STOMACH. DO NOT EAT FOR 2 HOURS BEFORE OR 1 HOUR AFTER. [facility] (Patient not taking: Reported on 09/05/2016) 30 tablet 5  . pantoprazole (PROTONIX) 40 MG tablet Take 1 tablet (40 mg total) by mouth 2 (two) times daily before a meal. (Patient not taking: Reported on 08/02/2016) 60 tablet 5   No current facility-administered medications for this visit.      Allergies:  Allergies  Allergen Reactions  . Nsaids Other (See Comments)    Patient only has one kidney.   . Meperidine Hcl Hives and Rash    Past Medical History, Surgical history, Social history, and Family History were reviewed and updated.   Physical  Exam: Blood pressure (!) 146/68, pulse 88, temperature 97.9 F (36.6 C), temperature source Oral, resp. rate 18, height 5\' 11"  (1.803 m), weight 176 lb 14.4 oz (80.2 kg), SpO2 94 %. ECOG: 1 General appearance: Alert, awake gentleman without distress. Head: No oral ulcers or lesions. Sclerae anicteric. No oral ulcers or lesions. Neck: no adenopathy, no masses Lymph nodes: Cervical, supraclavicular, and axillary nodes normal. Heart:regular rate and rhythm, S1, S2.  Lung:chest clear, no wheezing, or dullness to percussion. Abdomen: soft, non-tender, without masses  or organomegaly. No rebound or guarding. EXT: bilateral 1+ edema noted. Neurological exam: no motor or sensory deficits noted. Skin: No rashes or lesions noted. no petechiae or ecchymosis.  Neurological: Intact no deficits. No motor or sensory deficits.  CBC    Component Value Date/Time   WBC 4.2 08/31/2016 0902   WBC 5.4 03/19/2016 1636   RBC 3.24 (L) 08/31/2016 0902   RBC 2.66 (L) 03/19/2016 1636   HGB 9.4 (L) 08/31/2016 0902   HCT 30.3 (L) 08/31/2016 0902   PLT 72 (L) 08/31/2016 0902   MCV 93.5 08/31/2016 0902   MCH 29.0 08/31/2016 0902   MCH 32.7 03/19/2016 1636   MCHC 31.0 (L) 08/31/2016 0902   MCHC 32.6 03/19/2016 1636   RDW 19.0 (H) 08/31/2016 0902   LYMPHSABS 0.5 (L) 08/31/2016 0902   MONOABS 0.5 08/31/2016 0902   EOSABS 0.1 08/31/2016 0902   BASOSABS 0.0 08/31/2016 0902   EXAM: CT CHEST, ABDOMEN AND PELVIS WITHOUT CONTRAST  TECHNIQUE: Multidetector CT imaging of the chest, abdomen and pelvis was performed following the standard protocol without IV contrast.  COMPARISON:  None.  12/07/2015  FINDINGS: CT CHEST FINDINGS  Cardiovascular: The heart size appears normal. There is no pericardial effusion identified. Aortic atherosclerosis identified.  Mediastinum/Nodes: The trachea is patent and midline. Normal appearance of the esophagus. There is no mediastinal or hilar adenopathy identified. No axillary or supraclavicular adenopathy.  Lungs/Pleura: Postoperative changes from partial left lower lobectomy. The index subpleural nodule within the superior segment of left lower lobe measures 4 mm, image 57 of series 4. Unchanged from previous exam.  Musculoskeletal: No aggressive lytic or sclerotic bone lesions identified. Chronic left lateral rib deformities are identified and appears similar to previous exam, image 79 of series 4.  CT ABDOMEN PELVIS FINDINGS  Hepatobiliary: Normal appearance of the liver. The gallbladder is normal. No biliary  dilatation.  Pancreas: Grossly unchanged lesion within the tail of pancreas which measures 1.6 x 2.2 cm, image 66 of series 2. Previously 1.8 x 1.8 cm. No pancreatic ductal dilatation or surrounding inflammatory changes.  Spleen: Normal appearance of the spleen.  Adrenals/Urinary Tract: There has been enlargement and left adrenal nodule which measures 1.9 cm, image 65 of series 2. Previously 0.7 cm. Normal appearance of the right adrenal gland. Status post right nephrectomy. The left adrenal mass is incompletely visualized without IV contrast. This appears increased in size from previous exam now measuring 6.3 cm, image 68 of series 2. Previously 5.0 cm. Urinary bladder is unremarkable.  Stomach/Bowel: Stomach is within normal limits. Appendix appears normal. No evidence of bowel wall thickening, distention, or inflammatory changes. Distal colonic diverticula identified. No inflammation.  Vascular/Lymphatic: Normal appearance of the abdominal aorta. No enlarged lymph nodes within the upper abdomen. No pelvic or inguinal adenopathy.  Reproductive: Prostate is unremarkable.  Other: No abdominal wall hernia or abnormality. No abdominopelvic ascites.  Musculoskeletal: No acute or significant osseous findings.  IMPRESSION: 1. Stable CT of the chest.  No  evidence for thoracic metastasis. 2. Mass involving the upper pole of left kidney has increased in size from previous exam. 3. Increase in size of left adrenal gland nodule, favor metastases. 4. Stable indeterminate lesion within the tail of pancreas which may represent metastatic disease or primary pancreatic adenocarcinoma.     Impression and Plan:   65 year old gentleman with the following issues:   1. A history of renal cell carcinoma. He had stage T1b diagnosed in October 2010 of the right kidney, status post right nephrectomy. He also has left kidney cancer and pancreatic metastasis. He was initially  started on Votrient in December 2013. He had dose reduction on few occasions. The patient currently on Votrient 200 mg daily since September 2016 dose reduction.  CT scan obtained on 02/26/2016 showed slow progression of disease especially in his left renal mass.   He was started on Cabometyx which has been on hold due to his anal abscess.   CT scan obtained on 08/31/2016 was reviewed and discussed with the patient today. He did develop slight progression of disease although no rapid metastasis noted. I recommended starting Cabometyx in the near future given his abscess have fully healed. Complications associated with this medication were reviewed again including diarrhea, fatigue among others. He is agreeable to proceed at this time.   2. Pancreatic lesion. Biopsy proven to be malignant and likely represent renal cell cancer. Not dramatically on the recent CT scan.  3. Thrombocytopenia: Platelet count continues to fluctuate without bleeding. We'll continue to monitor.  4. Anemia.  Related to iron deficiency as well as renal insufficiency. Hemoglobin is improving.  5. Increase LFTs:  We will continue to monitor this moving forward. His most recent LFTs were within normal range.  6. Weight loss: Continues to improve at this time. I anticipates may be slight drop after restarting his anticancer treatment.   7. Perirectal abscess: Well-healed at this time.  8. Follow-up:  In one month.   [name] MD 09/05/16

## 2016-10-11 ENCOUNTER — Telehealth: Payer: Self-pay | Admitting: Oncology

## 2016-10-11 NOTE — Telephone Encounter (Signed)
Pt wife called to r/s appt to 2/23 at 1245. Pt has new appt date/time

## 2016-10-12 ENCOUNTER — Ambulatory Visit: Payer: Medicare Other | Admitting: Oncology

## 2016-10-12 ENCOUNTER — Other Ambulatory Visit: Payer: Medicare Other

## 2016-10-17 ENCOUNTER — Emergency Department (HOSPITAL_COMMUNITY)
Admission: EM | Admit: 2016-10-17 | Discharge: 2016-10-17 | Disposition: A | Discharge status: Home / Self Care | Payer: Medicare Other | Attending: Emergency Medicine | Admitting: Emergency Medicine

## 2016-10-17 ENCOUNTER — Encounter (HOSPITAL_COMMUNITY): Payer: Self-pay | Admitting: Emergency Medicine

## 2016-10-17 DIAGNOSIS — N183 Chronic kidney disease, stage 3 (moderate): Secondary | ICD-10-CM | POA: Diagnosis not present

## 2016-10-17 DIAGNOSIS — F17220 Nicotine dependence, chewing tobacco, uncomplicated: Secondary | ICD-10-CM | POA: Diagnosis not present

## 2016-10-17 DIAGNOSIS — M79671 Pain in right foot: Secondary | ICD-10-CM | POA: Diagnosis not present

## 2016-10-17 DIAGNOSIS — M25562 Pain in left knee: Secondary | ICD-10-CM

## 2016-10-17 DIAGNOSIS — M79672 Pain in left foot: Secondary | ICD-10-CM | POA: Diagnosis not present

## 2016-10-17 MED ORDER — PREDNISONE 10 MG PO TABS: 20.0000 mg | ORAL_TABLET | Freq: Every day | ORAL | 0 refills | Status: DC

## 2016-10-17 MED ORDER — HYDROCODONE-ACETAMINOPHEN 5-325 MG PO TABS
2.0000 | ORAL_TABLET | Freq: Once | ORAL | Status: AC
  Administered 2016-10-17: 2 via ORAL
  Filled 2016-10-17: qty 2

## 2016-10-17 MED ORDER — DEXAMETHASONE SODIUM PHOSPHATE 4 MG/ML IJ SOLN
8.0000 mg | Freq: Once | INTRAMUSCULAR | Status: AC
  Administered 2016-10-17: 8 mg via INTRAMUSCULAR
  Filled 2016-10-17: qty 2

## 2016-10-17 MED ORDER — OXYCODONE-ACETAMINOPHEN 5-325 MG PO TABS: 1.0000 | ORAL_TABLET | ORAL | 0 refills | Status: DC | PRN

## 2016-10-17 NOTE — Discharge Instructions (Signed)
Rest. Prescription for pain medicine and prednisone. Follow-up your primary care doctor.

## 2016-10-17 NOTE — ED Triage Notes (Signed)
Pt rpeoerts head and neck pain since Sunday. Pt reports left knee pain and back pain since yesterday. Pt reports is not able to bear weight on BLE. Pt reports history of gout. Distal pulses strong. BLE warm.

## 2016-10-17 NOTE — ED Provider Notes (Signed)
[facility] DEPT Provider Note   CSN: [id number] Arrival date & time: 10/17/16  1525     History   Chief Complaint Chief Complaint  Patient presents with  . Knee Pain    HPI Jeffrey Trujillo is a 66 y.o. male.  Complaints of left knee pain and bilateral foot pain since yesterday. No trauma, fever, sweats, chills. Patient has a history of gout.  He is having trouble bearing weight on the plantar aspect of both feet. Severity of pain is moderate. He has taken nothing at home.      Past Medical History:  Diagnosis Date  . Anal fistula   . Anemia in chronic renal disease   . Barrett's esophagus    by EGD 11/02/2009  . Chronic pain   . CKD (chronic kidney disease), stage III   . Diverticulosis of colon    by colonoscopy 11/02/2009  . DJD (degenerative joint disease) of cervical spine   . GERD (gastroesophageal reflux disease)   . Gouty arthritis    per pt on 05-16-2016 stable (last bout bilatearl wrist 03/ 2017)  . History of diverticulitis of colon    w/ small perforation , no surgical intervention 06/ 2014  . History of peptic ulcer disease   . Iron deficiency anemia due to chronic blood loss   . Metastatic renal cell carcinoma [facility]) oncologist-  dr Alen Blew--  currently on oral chemo -- RCC w/ mets to pancreas slow progression per last ct 06/ 2017   dx 2010 right renal cell carcinoma (pT1b, Fuhrman grade 3/4) s/p  radical nephrectomy 06-27-2009/  dx left renal cell carcinoma w/ mets to pancreas 11/ 2013    . Nocturia   . Thrombocytopenia ([place])    secondary to oral chemo  . Wears glasses     Patient Active Problem List   Diagnosis Date Noted  . Fever 12/07/2015  . Gouty arthritis 12/07/2015  . Chest wall pain   . Fall   . Pyrexia   . Acute gout 11/15/2015  . Perirectal abscess 11/12/2015  . Hypokalemia 11/12/2015  . Anemia of chronic disease 11/12/2015  . Gallstones   . Sinus bradycardia 02/14/2015  . Malnutrition of moderate degree ([place]) 02/12/2015  .  Elevated LFTs 02/12/2015  . Abdominal pain, right upper quadrant 02/12/2015  . Acute kidney injury ([place]) 02/12/2015  . Cholecystitis 02/11/2015  . Hiatal hernia   . Chest pain 04/15/2014  . Chest pain at rest 04/14/2014  . PUD (peptic ulcer disease) 04/21/2013  . Melena 03/10/2013  . Abdominal pain 03/10/2013  . Acute renal insufficiency 03/10/2013  . Leukopenia 03/10/2013  . Thrombocytopenia ([place]) 03/10/2013  . Abdominal abscess ([place]) 03/10/2013  . Anemia 03/10/2013  . ETOH abuse 03/10/2013  . Bradycardia 03/10/2013  . Barrett's esophagus 01/04/2013  . Renal cell cancer ([place]) 09/25/2012  . Lower GI bleed 09/25/2012  . Anemia due to blood loss 09/25/2012  . Acute gout of right foot 09/25/2012  . DJD (degenerative joint disease) 09/25/2012  . Pancreatic mass 08/04/2012  . Acute strain of neck muscle 06/05/2011  . RECTAL BLEEDING 10/20/2009  . NAUSEA WITH VOMITING 10/20/2009  . DIARRHEA, BLOODY 10/20/2009    Past Surgical History:  Procedure Laterality Date  . COLONOSCOPY  Feb 2011   [name]: single anal papilla, pan-colonic diverticula  . COLONOSCOPY  09/25/2012   RMR: Colonic diverticulosis. Suspect diverticular bleeding-hepatic flexure tic-sealed  . ESOPHAGOGASTRODUODENOSCOPY  Feb 2011   [name]: salmon-colored epithelium consistent with Barrett's, nodular antral erosions, small  hiatal hernia, negative H.pylori  . ESOPHAGOGASTRODUODENOSCOPY  09/25/2012   [id number] esophagus. Small hiatal hernia. Antral erosions of doubtful clinical significance, no biopsies  . ESOPHAGOGASTRODUODENOSCOPY N/A 03/11/2013   SLF: Barrett's esophagus/Single ulcer in the gastric antrum-PROBABLE SOURCE FOR BLACK STOOL/ Chronic gastritis in the gastric antrum  . ESOPHAGOGASTRODUODENOSCOPY N/A 10/11/2014   Dr. [name] barrett's s/p biopsy. Hiatal hernia. Biopsy with intestinal metaplasia, glandular atypia indefinite for low grade dysplasia noted as well  . ESOPHAGOGASTRODUODENOSCOPY N/A  01/19/2015   [name]: Barrett's no dysplasia  . EUS  08/07/2012   Procedure: UPPER ENDOSCOPIC ULTRASOUND (EUS) LINEAR;  Surgeon: [name], MD;  Location: [facility] ENDOSCOPY;  Service: Endoscopy;  Laterality: N/A;  . EUS  08/21/2012   Procedure: UPPER ENDOSCOPIC ULTRASOUND (EUS) LINEAR;  Surgeon: [name], MD;  Location: [facility] ENDOSCOPY;  Service: Endoscopy;  Laterality: N/A;  . INCISION AND DRAINAGE ABSCESS N/A 11/13/2015   Procedure: INCISION AND DRAINAGE ABSCESS;  Surgeon: Aviva Signs, MD;  Location: [facility] ORS;  Service: General;  Laterality: N/A;  . INGUINAL HERNIA REPAIR Bilateral 1990's  . LAPAROSCOPIC NEPHRECTOMY Right 06/27/2009   dr Alinda Money  . LUNG REMOVAL, PARTIAL  1970   left lower lobe, benign       Home Medications    Prior to Admission medications   Medication Sig Start Date End Date Taking? Authorizing Provider  acetaminophen (TYLENOL) 650 MG CR tablet Take 650 mg by mouth every 8 (eight) hours as needed for pain.   Yes Historical Provider, MD  CABOMETYX 40 MG TABS TAKE 1 TABLET BY MOUTH ONE TIME DAILY WITH AT LEAST 8 OZ OF WATER ON AN EMPTY STOMACH. DO NOT EAT FOR 2 HOURS BEFORE OR 1 HOUR AFTER. SWALLO 04/02/16  Yes [name], MD  cefUROXime (CEFTIN) 250 MG tablet Take 250 mg by mouth 3 (three) times daily. 10 day course starting on 10/16/2016 10/16/16  Yes Historical Provider, MD  diphenhydrAMINE (BENADRYL) 25 MG tablet Take 25 mg by mouth daily as needed for itching. Reported on 02/26/2016   Yes Historical Provider, MD  docusate sodium (COLACE) 100 MG capsule Take 1 capsule (100 mg total) by mouth every 12 (twelve) hours. 02/26/16  Yes Merrily Pew, MD  indomethacin (INDOCIN) 50 MG capsule Take 40 mg by mouth every 8 (eight) hours as needed for moderate pain.   Yes Historical Provider, MD  pantoprazole (PROTONIX) 40 MG tablet Take 1 tablet (40 mg total) by mouth 2 (two) times daily before a meal. 04/12/16  Yes Annitta Needs, NP  tobramycin-dexamethasone [facility])  ophthalmic solution Place 3 drops into the right ear 3 (three) times daily. 7 day course starting  10/16/2016 10/16/16  Yes Historical Provider, MD  topiramate (TOPAMAX) 25 MG tablet Take 25 mg by mouth 2 (two) times daily.   Yes Historical Provider, MD  oxyCODONE-acetaminophen (PERCOCET) 5-325 MG tablet Take 1-2 tablets by mouth every 4 (four) hours as needed. 10/17/16   [name], MD  predniSONE (DELTASONE) 10 MG tablet Take 2 tablets (20 mg total) by mouth daily. 10/17/16   [name], MD    Family History Family History  Problem Relation Age of Onset  . Lung cancer Brother   . Prostate cancer Brother   . Lumbar disc disease Brother   . Prostate cancer Brother   . Colon cancer Neg Hx     Social History Social History  Substance Use Topics  . Smoking status: Former Smoker    Packs/day: 0.25    Years: 4.00  Types: Cigars    Quit date: 05/16/2014  . Smokeless tobacco: Current User    Types: Chew     Comment: occasional cigar  . Alcohol use 2.4 - 3.0 oz/week    2 - 3 Cans of beer, 2 Shots of liquor per week     Comment: vodka and beer, sometimes daily,  couple drinks.& 2-3 beers on a weekend.     Allergies   Amoxicillin; Nsaids; Zofran [ondansetron hcl]; and Demerol [meperidine]   Review of Systems Review of Systems  All other systems reviewed and are negative.    Physical Exam Updated Vital Signs BP 171/80 (BP Location: Left Arm)   Pulse 79   Temp 98.2 F (36.8 C) (Oral)   Resp 18   Ht 5\' 11"  (1.803 m)   Wt 175 lb (79.4 kg)   SpO2 97%   BMI 24.41 kg/m   Physical Exam  Constitutional: He is oriented to person, place, and time. He appears well-developed and well-nourished.  HENT:  Head: Normocephalic and atraumatic.  Eyes: Conjunctivae are normal.  Neck: Neck supple.  Cardiovascular: Normal rate and regular rhythm.   Pulmonary/Chest: Effort normal and breath sounds normal.  Abdominal: Soft. Bowel sounds are normal.  Musculoskeletal:  Left knee: No  obvious edema or erythema. No evidence of septic joint. Both feet examined: No erythema, edema, rash.  No evidence of infection and feet  Neurological: He is alert and oriented to person, place, and time.  Skin: Skin is warm and dry.  Psychiatric: He has a normal mood and affect. His behavior is normal.  Nursing note and vitals reviewed.    ED Treatments / Results  Labs (all labs ordered are listed, but only abnormal results are displayed) Labs Reviewed - No data to display  EKG  EKG Interpretation None       Radiology No results found.  Procedures Procedures (including critical care time)  Medications Ordered in ED Medications  HYDROcodone-acetaminophen (NORCO/VICODIN) 5-325 MG per tablet 2 tablet (2 tablets Oral Given 10/17/16 1646)  dexamethasone (DECADRON) injection 8 mg (8 mg Intramuscular Given 10/17/16 1645)     Initial Impression / Assessment and Plan / ED Course  I have reviewed the triage vital signs and the nursing notes.  Pertinent labs & imaging results that were available during my care of the patient were reviewed by me and considered in my medical decision making (see chart for details).     Will treat for gout with Percocet and prednisone. Patient has primary care follow-up.  Final Clinical Impressions(s) / ED Diagnoses   Final diagnoses:  Acute pain of left knee  Bilateral foot pain    New Prescriptions New Prescriptions   OXYCODONE-ACETAMINOPHEN (PERCOCET) 5-325 MG TABLET    Take 1-2 tablets by mouth every 4 (four) hours as needed.   PREDNISONE (DELTASONE) 10 MG TABLET    Take 2 tablets (20 mg total) by mouth daily.     [name], MD 10/17/16 1719

## 2016-10-17 NOTE — ED Notes (Signed)
Patient refused knee brace states it will hurt is knee more. RN notified.

## 2016-10-29 ENCOUNTER — Encounter (HOSPITAL_COMMUNITY): Payer: Self-pay | Admitting: Emergency Medicine

## 2016-10-29 ENCOUNTER — Emergency Department (HOSPITAL_COMMUNITY)
Admission: EM | Admit: 2016-10-29 | Discharge: 2016-10-29 | Disposition: A | Discharge status: Home / Self Care | Payer: Medicare Other | Attending: Emergency Medicine | Admitting: Emergency Medicine

## 2016-10-29 DIAGNOSIS — M25511 Pain in right shoulder: Secondary | ICD-10-CM | POA: Diagnosis present

## 2016-10-29 DIAGNOSIS — F17220 Nicotine dependence, chewing tobacco, uncomplicated: Secondary | ICD-10-CM | POA: Diagnosis not present

## 2016-10-29 DIAGNOSIS — R51 Headache: Secondary | ICD-10-CM | POA: Insufficient documentation

## 2016-10-29 DIAGNOSIS — M7989 Other specified soft tissue disorders: Secondary | ICD-10-CM | POA: Insufficient documentation

## 2016-10-29 DIAGNOSIS — Z85528 Personal history of other malignant neoplasm of kidney: Secondary | ICD-10-CM | POA: Diagnosis not present

## 2016-10-29 DIAGNOSIS — Z8739 Personal history of other diseases of the musculoskeletal system and connective tissue: Secondary | ICD-10-CM | POA: Insufficient documentation

## 2016-10-29 DIAGNOSIS — M5030 Other cervical disc degeneration, unspecified cervical region: Secondary | ICD-10-CM

## 2016-10-29 DIAGNOSIS — Z79899 Other long term (current) drug therapy: Secondary | ICD-10-CM | POA: Insufficient documentation

## 2016-10-29 DIAGNOSIS — N183 Chronic kidney disease, stage 3 (moderate): Secondary | ICD-10-CM | POA: Insufficient documentation

## 2016-10-29 MED ORDER — PROMETHAZINE HCL 12.5 MG PO TABS
12.5000 mg | ORAL_TABLET | Freq: Once | ORAL | Status: AC
  Administered 2016-10-29: 12.5 mg via ORAL
  Filled 2016-10-29: qty 1

## 2016-10-29 MED ORDER — DEXAMETHASONE SODIUM PHOSPHATE 4 MG/ML IJ SOLN
8.0000 mg | Freq: Once | INTRAMUSCULAR | Status: AC
  Administered 2016-10-29: 8 mg via INTRAMUSCULAR
  Filled 2016-10-29: qty 2

## 2016-10-29 MED ORDER — HYDROCODONE-ACETAMINOPHEN 5-325 MG PO TABS: 1.0000 | ORAL_TABLET | Freq: Four times a day (QID) | ORAL | 0 refills | Status: DC | PRN

## 2016-10-29 MED ORDER — HYDROCODONE-ACETAMINOPHEN 5-325 MG PO TABS
2.0000 | ORAL_TABLET | Freq: Once | ORAL | Status: AC
  Administered 2016-10-29: 2 via ORAL
  Filled 2016-10-29: qty 2

## 2016-10-29 NOTE — ED Triage Notes (Signed)
Pt reports head and neck pain since Saturday with neck stiffness.  States he also has gout in both feet which are hurting.

## 2016-10-29 NOTE — ED Provider Notes (Signed)
Jeffrey Trujillo Provider Note   CSN: [id number] Arrival date & time: 10/29/16  0900     History   Chief Complaint Chief Complaint  Patient presents with  . Headache  . Joint Pain    HPI Jeffrey Trujillo is a 66 y.o. male.  Patient is a 67 year old male who presents to the emergency department with a complaint of neck pain, shoulder pain, and headache.  The patient states that 2-3 days ago he began to have pain in his shoulder and neck. This then developed into neck pain and headache at the lower portion of his skull on. He states that certain movements he feels as though that he is getting pins and needles in his neck and at times in the shoulder. The patient denies any recent fall or injury or trauma. There's been no recent fever or chills reported. No recent operations or procedures involving the neck, back, or shoulder. It is of note that the patient suffers from gout. 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 There's been no loss of bowel or bladder function. The patient presents to the emergency department for assistance with his pain and discomfort.   The history is provided by the patient.    Past Medical History:  Diagnosis Date  . Anal fistula   . Anemia in chronic renal disease   . Barrett's esophagus    by EGD 11/02/2009  . Chronic pain   . CKD (chronic kidney disease), stage III   . Diverticulosis of colon    by colonoscopy 11/02/2009  . DJD (degenerative joint disease) of cervical spine   . GERD (gastroesophageal reflux disease)   . Gouty arthritis    per pt on 05-16-2016 stable (last bout bilatearl wrist 03/ 2017)  . History of diverticulitis of colon    w/ small perforation , no surgical intervention 06/ 2014  . History of peptic ulcer disease   . Iron deficiency anemia due to chronic blood loss   . 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 Nocturia   . Thrombocytopenia ([place])    secondary to oral chemo  . Wears glasses     Patient Active Problem List   Diagnosis Date Noted  . Fever 12/07/2015  . Gouty arthritis 12/07/2015  . Chest wall pain   . Fall   . Pyrexia   . Acute gout 11/15/2015  . Perirectal abscess 11/12/2015  . Hypokalemia 11/12/2015  . Anemia of chronic disease 11/12/2015  . Gallstones   . Sinus bradycardia 02/14/2015  . Malnutrition of moderate degree ([place]) 02/12/2015  . Elevated LFTs 02/12/2015  . Abdominal pain, right upper quadrant 02/12/2015  . Acute kidney injury ([place]) 02/12/2015  . Cholecystitis 02/11/2015  . Hiatal hernia   . Chest pain 04/15/2014  . Chest pain at rest 04/14/2014  . PUD (peptic ulcer disease) 04/21/2013  . Melena 03/10/2013  . Abdominal pain 03/10/2013  . Acute renal insufficiency 03/10/2013  . Leukopenia 03/10/2013  . Thrombocytopenia ([place]) 03/10/2013  . Abdominal abscess ([place]) 03/10/2013  . Anemia 03/10/2013  . ETOH abuse 03/10/2013  . Bradycardia  03/10/2013  . Barrett's esophagus 01/04/2013  . Renal cell cancer ([place]) 09/25/2012  . Lower GI bleed 09/25/2012  . Anemia due to blood loss 09/25/2012  . Acute gout of right foot 09/25/2012  . DJD (degenerative joint disease) 09/25/2012  . Pancreatic mass 08/04/2012  . Acute strain of neck muscle 06/05/2011  . RECTAL BLEEDING 10/20/2009  . NAUSEA WITH VOMITING 10/20/2009  . DIARRHEA, BLOODY 10/20/2009    Past Surgical History:  Procedure Laterality Date  . COLONOSCOPY  Feb 2011   [name]: single anal papilla, pan-colonic diverticula  . COLONOSCOPY  09/25/2012   RMR: Colonic diverticulosis. Suspect diverticular bleeding-hepatic flexure tic-sealed  .  ESOPHAGOGASTRODUODENOSCOPY  Feb 2011   [name]: salmon-colored epithelium consistent with Barrett's, nodular antral erosions, small hiatal hernia, negative H.pylori  . ESOPHAGOGASTRODUODENOSCOPY  09/25/2012   [id number] esophagus. Small hiatal hernia. Antral erosions of doubtful clinical significance, no biopsies  . ESOPHAGOGASTRODUODENOSCOPY N/A 03/11/2013   SLF: Barrett's esophagus/Single ulcer in the gastric antrum-PROBABLE SOURCE FOR BLACK STOOL/ Chronic gastritis in the gastric antrum  . ESOPHAGOGASTRODUODENOSCOPY N/A 10/11/2014   Dr. [name] barrett's s/p biopsy. Hiatal hernia. Biopsy with intestinal metaplasia, glandular atypia indefinite for low grade dysplasia noted as well  . ESOPHAGOGASTRODUODENOSCOPY N/A 01/19/2015   [name]: Barrett's no dysplasia  . EUS  08/07/2012   Procedure: UPPER ENDOSCOPIC ULTRASOUND (EUS) LINEAR;  Surgeon: [name], MD;  Location: [facility];  Service: Endoscopy;  Laterality: N/A;  . EUS  08/21/2012   Procedure: UPPER ENDOSCOPIC ULTRASOUND (EUS) LINEAR;  Surgeon: [name], MD;  Location: [facility] ENDOSCOPY;  Service: Endoscopy;  Laterality: N/A;  . INCISION AND DRAINAGE ABSCESS N/A 11/13/2015   Procedure: INCISION AND DRAINAGE ABSCESS;  Surgeon: Aviva Signs, MD;  Location: [facility];  Service: General;  Laterality: N/A;  . INGUINAL HERNIA REPAIR Bilateral 1990's  . LAPAROSCOPIC NEPHRECTOMY Right 06/27/2009   dr Alinda Money  . LUNG REMOVAL, PARTIAL  1970   left lower lobe, benign       Home Medications    Prior to Admission medications   Medication Sig Start Date End Date Taking? Authorizing Provider  acetaminophen (TYLENOL) 650 MG CR tablet Take 650 mg by mouth every 8 (eight) hours as needed for pain.   Yes Historical Provider, MD  CABOMETYX 40 MG TABS TAKE 1 TABLET BY MOUTH ONE TIME DAILY WITH AT LEAST 8 OZ OF WATER ON AN EMPTY STOMACH. DO NOT EAT FOR 2 HOURS BEFORE OR 1 HOUR AFTER. SWALLO 04/02/16  Yes [name], MD  cefUROXime  (CEFTIN) 250 MG tablet Take 250 mg by mouth 3 (three) times daily. 10 day course starting on 10/16/2016 10/16/16  Yes Historical Provider, MD  diphenhydrAMINE (BENADRYL) 25 MG tablet Take 25 mg by mouth daily as needed for itching. Reported on 02/26/2016   Yes Historical Provider, MD  docusate sodium (COLACE) 100 MG capsule Take 1 capsule (100 mg total) by mouth every 12 (twelve) hours. Patient taking differently: Take 100 mg by mouth 2 (two) times daily as needed for mild constipation.  02/26/16  Yes Merrily Pew, MD  topiramate (TOPAMAX) 25 MG tablet Take 25 mg by mouth 2 (two) times daily.   Yes Historical Provider, MD  indomethacin (INDOCIN) 50 MG capsule Take 40 mg by mouth every 8 (eight) hours as needed for moderate pain.    Historical Provider, MD  oxyCODONE-acetaminophen (PERCOCET) 5-325 MG tablet Take 1-2 tablets by mouth every 4 (four) hours as needed. Patient not taking: Reported on 10/29/2016 10/17/16  [name], MD  pantoprazole (PROTONIX) 40 MG tablet Take 1 tablet (40 mg total) by mouth 2 (two) times daily before a meal. 04/12/16   Annitta Needs, NP  predniSONE (DELTASONE) 10 MG tablet Take 2 tablets (20 mg total) by mouth daily. Patient not taking: Reported on 10/29/2016 10/17/16   [name], MD    Family History Family History  Problem Relation Age of Onset  . Lung cancer Brother   . Prostate cancer Brother   . Lumbar disc disease Brother   . Prostate cancer Brother   . Colon cancer Neg Hx     Social History Social History  Substance Use Topics  . Smoking status: Former Smoker    Packs/day: 0.25    Years: 4.00    Types: Cigars    Quit date: 05/16/2014  . Smokeless tobacco: Current User    Types: Chew     Comment: occasional cigar  . Alcohol use 2.4 - 3.0 oz/week    2 - 3 Cans of beer, 2 Shots of liquor per week     Comment: vodka and beer, sometimes daily,  couple drinks.& 2-3 beers on a weekend.     Allergies   Amoxicillin; Nsaids; Zofran [ondansetron hcl]; and  Demerol [meperidine]   Review of Systems Review of Systems  Musculoskeletal: Positive for arthralgias and back pain.  Neurological: Positive for headaches.  All other systems reviewed and are negative.    Physical Exam Updated Vital Signs BP 151/77 (BP Location: Left Arm)   Pulse 70   Temp 98.6 F (37 C) (Oral)   Resp 18   Ht 5\' 11"  (1.803 m)   Wt 82.6 kg   SpO2 97%   BMI 25.38 kg/m   Physical Exam  Constitutional: He is oriented to person, place, and time. He appears well-developed and well-nourished.  Non-toxic appearance.  HENT:  Head: Normocephalic.  Right Ear: Tympanic membrane and external ear normal.  Left Ear: Tympanic membrane and external ear normal.  Eyes: EOM and lids are normal. Pupils are equal, round, and reactive to light.  Neck: Normal range of motion. Neck supple. Carotid bruit is not present.  Cardiovascular: Normal rate, regular rhythm, normal heart sounds, intact distal pulses and normal pulses.   Pulmonary/Chest: Breath sounds normal. No respiratory distress.  Abdominal: Soft. Bowel sounds are normal. There is no tenderness. There is no guarding.  Musculoskeletal:       Right shoulder: He exhibits decreased range of motion, tenderness and pain. He exhibits no deformity.       Cervical back: He exhibits decreased range of motion, tenderness and pain.       Lumbar back: He exhibits decreased range of motion and pain.  Pain at the [facility] joint area on the right. No hot joint. No deformity. Moderate crepitus on limited exam. Left shoulder is not hot. Good ROM. Pain to palpation of the paraspinal muscles of the c-spine. No hot areas.  Swelling of the right and left ankle. Pain to palpation and attempted ROM of the left ankle.  Lymphadenopathy:       Head (right side): No submandibular adenopathy present.       Head (left side): No submandibular adenopathy present.    He has no cervical adenopathy.  Neurological: He is alert and oriented to person, place, and  time. He has normal strength. No cranial nerve deficit or sensory deficit.  Skin: Skin is warm and dry.  Psychiatric: He has a normal mood and affect. His speech  is normal.  Nursing note and vitals reviewed.    ED Treatments / Results  Labs (all labs ordered are listed, but only abnormal results are displayed) Labs Reviewed - No data to display  EKG  EKG Interpretation None       Radiology No results found.  Procedures Procedures (including critical care time)  Medications Ordered in ED Medications  HYDROcodone-acetaminophen (NORCO/VICODIN) 5-325 MG per tablet 2 tablet (2 tablets Oral Given 10/29/16 1125)  promethazine (PHENERGAN) tablet 12.5 mg (12.5 mg Oral Given 10/29/16 1125)  dexamethasone (DECADRON) injection 8 mg (8 mg Intramuscular Given 10/29/16 1126)     Initial Impression / Assessment and Plan / ED Course Pt seen with me by Dr Reather Converse.  I have reviewed the triage vital signs and the nursing notes.  Pertinent labs & imaging results that were available during my care of the patient were reviewed by me and considered in my medical decision making (see chart for details).     **I have reviewed nursing notes, vital signs, and all appropriate lab and imaging results for this patient.*  Final Clinical Impressions(s) / ED Diagnoses MDM Vital signs reviewed. No hot joints. Hx of gout and DDD of the C-spine. No gross neuro deficits. Patient states he feels better after medications in the emergency department. Pt will be treated with norco for pain. IM decadron given in the ED. Pt to follow up with PCP.   Final diagnoses:  Right shoulder pain, unspecified chronicity  DDD (degenerative disc disease), cervical  History of gout    New Prescriptions Discharge Medication List as of 10/29/2016  1:14 PM    START taking these medications   Details  HYDROcodone-acetaminophen (NORCO/VICODIN) 5-325 MG tablet Take 1 tablet by mouth every 6 (six) hours as needed., Starting Mon  10/29/2016, Print         Lily Kocher, PA-C 10/29/16 2002    Elnora Morrison, MD 10/30/16 [phone]

## 2016-10-29 NOTE — Discharge Instructions (Signed)
Please use a heating pad to the shoulder, back, and back of the neck. Use tylenol for mild pain. Use norco for more severe pain. Use a stool softener while taking the norco medication. See your MD at [place] for additional pain management.

## 2016-11-09 ENCOUNTER — Telehealth: Payer: Self-pay | Admitting: Oncology

## 2016-11-09 ENCOUNTER — Other Ambulatory Visit (HOSPITAL_BASED_OUTPATIENT_CLINIC_OR_DEPARTMENT_OTHER): Payer: Medicare Other

## 2016-11-09 ENCOUNTER — Ambulatory Visit (HOSPITAL_BASED_OUTPATIENT_CLINIC_OR_DEPARTMENT_OTHER): Payer: Medicare Other | Admitting: Oncology

## 2016-11-09 VITALS — BP 152/60 | HR 57 | Temp 98.4°F | Resp 18 | Ht 71.0 in | Wt 175.8 lb

## 2016-11-09 DIAGNOSIS — D509 Iron deficiency anemia, unspecified: Secondary | ICD-10-CM | POA: Diagnosis not present

## 2016-11-09 DIAGNOSIS — C7889 Secondary malignant neoplasm of other digestive organs: Secondary | ICD-10-CM | POA: Diagnosis not present

## 2016-11-09 DIAGNOSIS — C649 Malignant neoplasm of unspecified kidney, except renal pelvis: Secondary | ICD-10-CM

## 2016-11-09 DIAGNOSIS — R634 Abnormal weight loss: Secondary | ICD-10-CM | POA: Diagnosis not present

## 2016-11-09 DIAGNOSIS — C641 Malignant neoplasm of right kidney, except renal pelvis: Secondary | ICD-10-CM

## 2016-11-09 DIAGNOSIS — D696 Thrombocytopenia, unspecified: Secondary | ICD-10-CM

## 2016-11-09 LAB — COMPREHENSIVE METABOLIC PANEL
ALBUMIN: 3.5 g/dL (ref 3.5–5.0)
ALK PHOS: 80 U/L (ref 40–150)
ALT: 17 U/L (ref 0–55)
ANION GAP: 7 meq/L (ref 3–11)
AST: 19 U/L (ref 5–34)
BILIRUBIN TOTAL: 0.46 mg/dL (ref 0.20–1.20)
BUN: 23 mg/dL (ref 7.0–26.0)
CALCIUM: 9.7 mg/dL (ref 8.4–10.4)
CO2: 28 mEq/L (ref 22–29)
Chloride: 109 mEq/L (ref 98–109)
Creatinine: 1.5 mg/dL — ABNORMAL HIGH (ref 0.7–1.3)
EGFR: 49 mL/min/{1.73_m2} — AB (ref >90)
GLUCOSE: 89 mg/dL (ref 70–140)
POTASSIUM: 4.2 meq/L (ref 3.5–5.1)
SODIUM: 144 meq/L (ref 136–145)
TOTAL PROTEIN: 7.4 g/dL (ref 6.4–8.3)

## 2016-11-09 LAB — CBC WITH DIFFERENTIAL/PLATELET
BASO%: 0 % (ref 0.0–2.0)
BASOS ABS: 0 10*3/uL (ref 0.0–0.1)
EOS ABS: 0 10*3/uL (ref 0.0–0.5)
EOS%: 0.1 % (ref 0.0–7.0)
HCT: 28.8 % — ABNORMAL LOW (ref 38.4–49.9)
HEMOGLOBIN: 9.1 g/dL — AB (ref 13.0–17.1)
LYMPH%: 9 % — ABNORMAL LOW (ref 14.0–49.0)
MCH: 28.9 pg (ref 27.2–33.4)
MCHC: 31.6 g/dL — ABNORMAL LOW (ref 32.0–36.0)
MCV: 91.4 fL (ref 79.3–98.0)
MONO#: 0.6 10*3/uL (ref 0.1–0.9)
MONO%: 8.5 % (ref 0.0–14.0)
NEUT%: 82.4 % — ABNORMAL HIGH (ref 39.0–75.0)
NEUTROS ABS: 6 10*3/uL (ref 1.5–6.5)
Platelets: 152 10*3/uL (ref 140–400)
RBC: 3.15 10*6/uL — ABNORMAL LOW (ref 4.20–5.82)
RDW: 17.2 % — AB (ref 11.0–14.6)
WBC: 7.3 10*3/uL (ref 4.0–10.3)
lymph#: 0.7 10*3/uL — ABNORMAL LOW (ref 0.9–3.3)

## 2016-11-09 NOTE — Progress Notes (Signed)
Hematology and Oncology Follow Up Visit  Jeffrey Trujillo [id number] 11/09/16     Principle Diagnosis: 66 year old with the following issues:  1. T1b right kidney cancer diagnosed in 2010. He is S/P rdical nephrectomy on June 27, 2009. The pathological staging was T1b clear cell histology with Fuhrman grade 3/4.  2. Left Kidney mass  Measuring 4.5 x 2 7 x 3.6 irregular enhancing appears to be a new kidney cancer noted in 07/2012. 3. Pancreatic lesion. 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 4. Votrient  800 mg daily started in December 2013. Treatment held in 11/2012 due to increase LFTs. Votrient was resumed in April 2014 at 400 mg daily. His dose was reduced further to 200 mg daily starting in September 2016. Therapy discontinued in June 2017 because of progression of disease.   Current therapy: Cabometyx 40 mg daily started in July 2017. Therapy has been on hold since August of 2017 because of perianal abscess. Therapy was resumed temporarily in December 2017 but discontinued because of relapse in his abscess.  Interim History: Mr. Fortino presents for a follow up visit. Since his last visit, he reports feels reasonably well. He resumed Cabometyx 40 mg daily for a few days but discontinued it because of rectal bleeding and relapse of his abscess. He is following with Dr. Marcello Moores regarding this issue in his symptoms has improved as of late. Although he does have periodic drainage and pain at times.  He did report also neck pain or shoulder pain and currently under evaluation by orthopedic surgery. He still ambulating without any major difficulties. He is able to eat and maintain his weight.  He is not having any fevers or chills. He did not report any headaches blurred vision double vision. Does not report any motor sensory neuropathy or alteration of mental status. He does not report any depression or anxiety. He does not report any frequency urgency or hesitancy. He does  not report any other urinary symptoms. Remainder of his review of systems is unremarkable.  Medications: Unchanged by my review today. Current Outpatient Prescriptions  Medication Sig Dispense Refill  . acetaminophen (TYLENOL) 650 MG CR tablet Take 650 mg by mouth every 8 (eight) hours as needed for pain.    Marland Kitchen CABOMETYX 40 MG TABS TAKE 1 TABLET BY MOUTH ONE TIME DAILY WITH AT LEAST 8 OZ OF WATER ON AN EMPTY STOMACH. DO NOT EAT FOR 2 HOURS BEFORE OR 1 HOUR AFTER. SWALLO 30 tablet 5  . cefUROXime (CEFTIN) 250 MG tablet Take 250 mg by mouth 3 (three) times daily. 10 day course starting on 10/16/2016  0  . diphenhydrAMINE (BENADRYL) 25 MG tablet Take 25 mg by mouth daily as needed for itching. Reported on 02/26/2016    . docusate sodium (COLACE) 100 MG capsule Take 1 capsule (100 mg total) by mouth every 12 (twelve) hours. (Patient taking differently: Take 100 mg by mouth 2 (two) times daily as needed for mild constipation. ) 60 capsule 0  . HYDROcodone-acetaminophen (NORCO/VICODIN) 5-325 MG tablet Take 1 tablet by mouth every 6 (six) hours as needed. 12 tablet 0  . indomethacin (INDOCIN) 50 MG capsule Take 40 mg by mouth every 8 (eight) hours as needed for moderate pain.    Marland Kitchen oxyCODONE-acetaminophen (PERCOCET) 5-325 MG tablet Take 1-2 tablets by mouth every 4 (four) hours as needed. 20 tablet 0  . pantoprazole (PROTONIX) 40 MG tablet Take 1 tablet (40 mg total) by mouth 2 (two) times daily before  a meal. 60 tablet 5  . predniSONE (DELTASONE) 10 MG tablet Take 2 tablets (20 mg total) by mouth daily. 15 tablet 0  . topiramate (TOPAMAX) 25 MG tablet Take 25 mg by mouth 2 (two) times daily.     No current facility-administered medications for this visit.      Allergies:  Allergies  Allergen Reactions  . Nsaids Other (See Comments)    Patient only has one kidney.   . Meperidine Hcl Hives and Rash    Past Medical History, Surgical history, Social history, and Family History were reviewed and  updated.   Physical Exam: Blood pressure (!) 152/60, pulse (!) 57, temperature 98.4 F (36.9 C), temperature source Oral, resp. rate 18, height 5\' 11"  (1.803 m), weight 175 lb 12.8 oz (79.7 kg), SpO2 97 %. ECOG: 1 General appearance: Alert, awake gentleman without distress. Head: No oral ulcers or lesions.  Neck: no adenopathy, no masses Lymph nodes: Cervical, supraclavicular, and axillary nodes normal. Heart:regular rate and rhythm, S1, S2.  Lung:chest clear, no wheezing, or dullness to percussion. Abdomen: soft, non-tender, without masses or organomegaly. No rebound or guarding. EXT: Trace edema noted. Neurological exam: no motor or sensory deficits noted. Skin: No rashes or lesions noted. no petechiae or ecchymosis.    CBC    Component Value Date/Time   WBC 7.3 11/09/2016 1241   WBC 5.4 03/19/2016 1636   RBC 3.15 (L) 11/09/2016 1241   RBC 2.66 (L) 03/19/2016 1636   HGB 9.1 (L) 11/09/2016 1241   HCT 28.8 (L) 11/09/2016 1241   PLT 152 11/09/2016 1241   MCV 91.4 11/09/2016 1241   MCH 28.9 11/09/2016 1241   MCH 32.7 03/19/2016 1636   MCHC 31.6 (L) 11/09/2016 1241   MCHC 32.6 03/19/2016 1636   RDW 17.2 (H) 11/09/2016 1241   LYMPHSABS 0.7 (L) 11/09/2016 1241   MONOABS 0.6 11/09/2016 1241   EOSABS 0.0 11/09/2016 1241   BASOSABS 0.0 11/09/2016 1241       Impression and Plan:   66 year old gentleman with the following issues:   1. A history of renal cell carcinoma. He had stage T1b diagnosed in October 2010 of the right kidney, status post right nephrectomy. He also has left kidney cancer and pancreatic metastasis. He was initially started on Votrient in December 2013. He had dose reduction on few occasions. The patient currently on Votrient 200 mg daily since September 2016 dose reduction.  CT scan obtained on 02/26/2016 showed slow progression of disease especially in his left renal mass.   He is currently on Cabometyx which has been on hold due to his anal abscess.    The plan is to continue to hold his treatment until his absence of fully healed. CT scan obtained in December 2017 showed very minimal changes indicating rather slow disease progression.   2. Pancreatic lesion. Biopsy proven to be malignant and likely represent renal cell cancer. Slightly increased based on his last CT scan.  3. Thrombocytopenia: Platelet count back to normal off treatment.  4. Anemia.  Related to iron deficiency as well as renal insufficiency. Hemoglobin is improving.  5. Increase LFTs:  We will continue to monitor this moving forward. His most recent LFTs were within normal range.  6. Weight loss: Continues to improve off treatment.  7. Perirectal abscess: Ccontinues to follow with Dr. Marcello Moores.  8. Follow-up:  In 4-6 weeks to follow his progress.  [name] MD 11/09/16

## 2016-11-09 NOTE — Telephone Encounter (Signed)
Appointments scheduled per 2/23 LOS. Patient given AVS report and calendars with future scheduled appointments. °

## 2016-11-15 ENCOUNTER — Emergency Department (HOSPITAL_COMMUNITY): Payer: Medicare Other

## 2016-11-15 ENCOUNTER — Encounter (HOSPITAL_COMMUNITY): Payer: Self-pay | Admitting: *Deleted

## 2016-11-15 ENCOUNTER — Inpatient Hospital Stay (HOSPITAL_COMMUNITY): Payer: Medicare Other

## 2016-11-15 ENCOUNTER — Telehealth: Payer: Self-pay | Admitting: *Deleted

## 2016-11-15 ENCOUNTER — Inpatient Hospital Stay (HOSPITAL_COMMUNITY)
Admission: EM | Admit: 2016-11-15 | Discharge: 2016-11-20 | DRG: 551 | Disposition: A | Discharge status: Home / Self Care | Payer: Medicare Other | Attending: Internal Medicine | Admitting: Internal Medicine

## 2016-11-15 DIAGNOSIS — G8929 Other chronic pain: Secondary | ICD-10-CM

## 2016-11-15 DIAGNOSIS — Z87891 Personal history of nicotine dependence: Secondary | ICD-10-CM

## 2016-11-15 DIAGNOSIS — N183 Chronic kidney disease, stage 3 (moderate): Secondary | ICD-10-CM | POA: Diagnosis present

## 2016-11-15 DIAGNOSIS — R32 Unspecified urinary incontinence: Secondary | ICD-10-CM | POA: Diagnosis present

## 2016-11-15 DIAGNOSIS — I129 Hypertensive chronic kidney disease with stage 1 through stage 4 chronic kidney disease, or unspecified chronic kidney disease: Secondary | ICD-10-CM | POA: Diagnosis present

## 2016-11-15 DIAGNOSIS — M47812 Spondylosis without myelopathy or radiculopathy, cervical region: Principal | ICD-10-CM | POA: Diagnosis present

## 2016-11-15 DIAGNOSIS — D638 Anemia in other chronic diseases classified elsewhere: Secondary | ICD-10-CM | POA: Diagnosis present

## 2016-11-15 DIAGNOSIS — Z801 Family history of malignant neoplasm of trachea, bronchus and lung: Secondary | ICD-10-CM

## 2016-11-15 DIAGNOSIS — M5030 Other cervical disc degeneration, unspecified cervical region: Secondary | ICD-10-CM | POA: Diagnosis present

## 2016-11-15 DIAGNOSIS — M542 Cervicalgia: Secondary | ICD-10-CM

## 2016-11-15 DIAGNOSIS — G9340 Encephalopathy, unspecified: Secondary | ICD-10-CM | POA: Diagnosis present

## 2016-11-15 DIAGNOSIS — Z85528 Personal history of other malignant neoplasm of kidney: Secondary | ICD-10-CM | POA: Diagnosis not present

## 2016-11-15 DIAGNOSIS — R399 Unspecified symptoms and signs involving the genitourinary system: Secondary | ICD-10-CM

## 2016-11-15 DIAGNOSIS — Z66 Do not resuscitate: Secondary | ICD-10-CM | POA: Diagnosis present

## 2016-11-15 DIAGNOSIS — Z888 Allergy status to other drugs, medicaments and biological substances status: Secondary | ICD-10-CM

## 2016-11-15 DIAGNOSIS — Z7189 Other specified counseling: Secondary | ICD-10-CM

## 2016-11-15 DIAGNOSIS — Z886 Allergy status to analgesic agent status: Secondary | ICD-10-CM

## 2016-11-15 DIAGNOSIS — N179 Acute kidney failure, unspecified: Secondary | ICD-10-CM | POA: Diagnosis present

## 2016-11-15 DIAGNOSIS — Z905 Acquired absence of kidney: Secondary | ICD-10-CM | POA: Diagnosis not present

## 2016-11-15 DIAGNOSIS — Z7952 Long term (current) use of systemic steroids: Secondary | ICD-10-CM | POA: Diagnosis not present

## 2016-11-15 DIAGNOSIS — C7889 Secondary malignant neoplasm of other digestive organs: Secondary | ICD-10-CM | POA: Diagnosis present

## 2016-11-15 DIAGNOSIS — M1990 Unspecified osteoarthritis, unspecified site: Secondary | ICD-10-CM | POA: Diagnosis present

## 2016-11-15 DIAGNOSIS — R7989 Other specified abnormal findings of blood chemistry: Secondary | ICD-10-CM

## 2016-11-15 DIAGNOSIS — K219 Gastro-esophageal reflux disease without esophagitis: Secondary | ICD-10-CM | POA: Diagnosis present

## 2016-11-15 DIAGNOSIS — T402X5A Adverse effect of other opioids, initial encounter: Secondary | ICD-10-CM | POA: Diagnosis not present

## 2016-11-15 DIAGNOSIS — R519 Headache, unspecified: Secondary | ICD-10-CM

## 2016-11-15 DIAGNOSIS — Z8042 Family history of malignant neoplasm of prostate: Secondary | ICD-10-CM | POA: Diagnosis not present

## 2016-11-15 DIAGNOSIS — Z885 Allergy status to narcotic agent status: Secondary | ICD-10-CM

## 2016-11-15 DIAGNOSIS — R3129 Other microscopic hematuria: Secondary | ICD-10-CM

## 2016-11-15 DIAGNOSIS — K2270 Barrett's esophagus without dysplasia: Secondary | ICD-10-CM | POA: Diagnosis present

## 2016-11-15 DIAGNOSIS — M109 Gout, unspecified: Secondary | ICD-10-CM | POA: Diagnosis present

## 2016-11-15 DIAGNOSIS — R4182 Altered mental status, unspecified: Secondary | ICD-10-CM

## 2016-11-15 DIAGNOSIS — K615 Supralevator abscess: Secondary | ICD-10-CM | POA: Diagnosis present

## 2016-11-15 DIAGNOSIS — K5730 Diverticulosis of large intestine without perforation or abscess without bleeding: Secondary | ICD-10-CM | POA: Diagnosis present

## 2016-11-15 DIAGNOSIS — R531 Weakness: Secondary | ICD-10-CM

## 2016-11-15 DIAGNOSIS — C649 Malignant neoplasm of unspecified kidney, except renal pelvis: Secondary | ICD-10-CM | POA: Diagnosis not present

## 2016-11-15 DIAGNOSIS — Z515 Encounter for palliative care: Secondary | ICD-10-CM

## 2016-11-15 DIAGNOSIS — G893 Neoplasm related pain (acute) (chronic): Secondary | ICD-10-CM | POA: Diagnosis present

## 2016-11-15 DIAGNOSIS — R001 Bradycardia, unspecified: Secondary | ICD-10-CM | POA: Diagnosis not present

## 2016-11-15 DIAGNOSIS — K611 Rectal abscess: Secondary | ICD-10-CM | POA: Diagnosis present

## 2016-11-15 DIAGNOSIS — Z9221 Personal history of antineoplastic chemotherapy: Secondary | ICD-10-CM

## 2016-11-15 DIAGNOSIS — Z881 Allergy status to other antibiotic agents status: Secondary | ICD-10-CM

## 2016-11-15 DIAGNOSIS — R51 Headache: Secondary | ICD-10-CM

## 2016-11-15 DIAGNOSIS — D500 Iron deficiency anemia secondary to blood loss (chronic): Secondary | ICD-10-CM | POA: Diagnosis present

## 2016-11-15 DIAGNOSIS — Z902 Acquired absence of lung [part of]: Secondary | ICD-10-CM

## 2016-11-15 LAB — CBC
HEMATOCRIT: 33 % — AB (ref 39.0–52.0)
HEMOGLOBIN: 10.7 g/dL — AB (ref 13.0–17.0)
MCH: 30.2 pg (ref 26.0–34.0)
MCHC: 32.4 g/dL (ref 30.0–36.0)
MCV: 93.2 fL (ref 78.0–100.0)
Platelets: 165 10*3/uL (ref 150–400)
RBC: 3.54 MIL/uL — ABNORMAL LOW (ref 4.22–5.81)
RDW: 17.2 % — AB (ref 11.5–15.5)
WBC: 6.9 10*3/uL (ref 4.0–10.5)

## 2016-11-15 LAB — URINALYSIS, ROUTINE W REFLEX MICROSCOPIC
BACTERIA UA: NONE SEEN
BILIRUBIN URINE: NEGATIVE
Glucose, UA: NEGATIVE mg/dL
KETONES UR: NEGATIVE mg/dL
LEUKOCYTES UA: NEGATIVE
NITRITE: NEGATIVE
PROTEIN: NEGATIVE mg/dL
SPECIFIC GRAVITY, URINE: 1.008 (ref 1.005–1.030)
pH: 6 (ref 5.0–8.0)

## 2016-11-15 LAB — COMPREHENSIVE METABOLIC PANEL
ALBUMIN: 3.4 g/dL — AB (ref 3.5–5.0)
ALK PHOS: 56 U/L (ref 38–126)
ALT: 16 U/L — ABNORMAL LOW (ref 17–63)
ANION GAP: 7 (ref 5–15)
AST: 16 U/L (ref 15–41)
BUN: 18 mg/dL (ref 6–20)
CO2: 27 mmol/L (ref 22–32)
Calcium: 9.1 mg/dL (ref 8.9–10.3)
Chloride: 103 mmol/L (ref 101–111)
Creatinine, Ser: 1.26 mg/dL — ABNORMAL HIGH (ref 0.61–1.24)
GFR calc Af Amer: >60 mL/min (ref >60)
GFR calc non Af Amer: 58 mL/min — ABNORMAL LOW (ref >60)
GLUCOSE: 106 mg/dL — AB (ref 65–99)
POTASSIUM: 3.6 mmol/L (ref 3.5–5.1)
SODIUM: 137 mmol/L (ref 135–145)
Total Bilirubin: 1 mg/dL (ref 0.3–1.2)
Total Protein: 7 g/dL (ref 6.5–8.1)

## 2016-11-15 LAB — I-STAT CG4 LACTIC ACID, ED
LACTIC ACID, VENOUS: 5.05 mmol/L — AB (ref 0.5–1.9)
Lactic Acid, Venous: 0.75 mmol/L (ref 0.5–1.9)

## 2016-11-15 LAB — LACTIC ACID, PLASMA
LACTIC ACID, VENOUS: 1.8 mmol/L (ref 0.5–1.9)
Lactic Acid, Venous: 1.2 mmol/L (ref 0.5–1.9)

## 2016-11-15 LAB — PROTIME-INR
INR: 1.08
Prothrombin Time: 14 seconds (ref 11.4–15.2)

## 2016-11-15 LAB — I-STAT TROPONIN, ED: TROPONIN I, POC: 0 ng/mL (ref 0.00–0.08)

## 2016-11-15 LAB — CBG MONITORING, ED: Glucose-Capillary: 102 mg/dL — ABNORMAL HIGH (ref 65–99)

## 2016-11-15 LAB — ETHANOL: Alcohol, Ethyl (B): <5 mg/dL (ref <5)

## 2016-11-15 LAB — PROCALCITONIN: Procalcitonin: 0.3 ng/mL

## 2016-11-15 LAB — MRSA PCR SCREENING: MRSA by PCR: NEGATIVE

## 2016-11-15 MED ORDER — SODIUM CHLORIDE 0.9% FLUSH
3.0000 mL | Freq: Two times a day (BID) | INTRAVENOUS | Status: DC
  Administered 2016-11-15 – 2016-11-20 (×8): 3 mL via INTRAVENOUS

## 2016-11-15 MED ORDER — VANCOMYCIN HCL 10 G IV SOLR
1500.0000 mg | Freq: Once | INTRAVENOUS | Status: AC
  Administered 2016-11-15: 1500 mg via INTRAVENOUS
  Filled 2016-11-15: qty 1500

## 2016-11-15 MED ORDER — LORAZEPAM 2 MG/ML IJ SOLN
1.0000 mg | Freq: Once | INTRAMUSCULAR | Status: DC
  Filled 2016-11-15: qty 1

## 2016-11-15 MED ORDER — SODIUM CHLORIDE 0.9 % IV BOLUS (SEPSIS)
500.0000 mL | Freq: Once | INTRAVENOUS | Status: AC
  Administered 2016-11-15: 500 mL via INTRAVENOUS

## 2016-11-15 MED ORDER — SODIUM CHLORIDE 0.9 % IV SOLN
INTRAVENOUS | Status: DC
  Administered 2016-11-15: 13:00:00 via INTRAVENOUS

## 2016-11-15 MED ORDER — SODIUM CHLORIDE 0.9 % IV BOLUS (SEPSIS)
1000.0000 mL | Freq: Once | INTRAVENOUS | Status: AC
  Administered 2016-11-15: 1000 mL via INTRAVENOUS

## 2016-11-15 MED ORDER — ACETAMINOPHEN 325 MG PO TABS: 650.0000 mg | ORAL_TABLET | Freq: Four times a day (QID) | ORAL | Status: DC | PRN

## 2016-11-15 MED ORDER — VANCOMYCIN HCL IN DEXTROSE 1-5 GM/200ML-% IV SOLN: 1000.0000 mg | Freq: Two times a day (BID) | INTRAVENOUS | Status: DC

## 2016-11-15 MED ORDER — SODIUM CHLORIDE 0.9 % IV SOLN
1.0000 mg/h | INTRAVENOUS | Status: DC
  Filled 2016-11-15: qty 10

## 2016-11-15 MED ORDER — PIPERACILLIN-TAZOBACTAM 3.375 G IVPB: 3.3750 g | Freq: Three times a day (TID) | INTRAVENOUS | Status: DC

## 2016-11-15 MED ORDER — ENOXAPARIN SODIUM 40 MG/0.4ML ~~LOC~~ SOLN
40.0000 mg | SUBCUTANEOUS | Status: DC
  Administered 2016-11-15 – 2016-11-16 (×2): 40 mg via SUBCUTANEOUS
  Filled 2016-11-15 (×2): qty 0.4

## 2016-11-15 MED ORDER — LACTATED RINGERS IV SOLN
INTRAVENOUS | Status: DC
  Administered 2016-11-15 – 2016-11-18 (×5): via INTRAVENOUS

## 2016-11-15 MED ORDER — MORPHINE SULFATE (PF) 2 MG/ML IV SOLN
2.0000 mg | INTRAVENOUS | Status: DC | PRN
  Administered 2016-11-15 (×2): 2 mg via INTRAVENOUS
  Filled 2016-11-15 (×2): qty 1

## 2016-11-15 MED ORDER — VANCOMYCIN HCL IN DEXTROSE 1-5 GM/200ML-% IV SOLN
1000.0000 mg | Freq: Once | INTRAVENOUS | Status: DC
  Filled 2016-11-15: qty 200

## 2016-11-15 MED ORDER — LORAZEPAM 2 MG/ML IJ SOLN
0.5000 mg | Freq: Once | INTRAMUSCULAR | Status: AC
  Administered 2016-11-15: 0.5 mg via INTRAVENOUS

## 2016-11-15 MED ORDER — ACETAMINOPHEN 650 MG RE SUPP: 650.0000 mg | Freq: Four times a day (QID) | RECTAL | Status: DC | PRN

## 2016-11-15 MED ORDER — PIPERACILLIN-TAZOBACTAM 3.375 G IVPB 30 MIN
3.3750 g | Freq: Once | INTRAVENOUS | Status: AC
  Administered 2016-11-15: 3.375 g via INTRAVENOUS
  Filled 2016-11-15: qty 50

## 2016-11-15 NOTE — H&P (Addendum)
History and Physical    Jeffrey Trujillo [id number] DOB: [birth year] DOA: 11/15/2016  PCP: [facility] Consultants:  [facility] - oncology; [facility] Moores - surgery Patient coming from: home - lives with wife; NOK: wife, [phone]  Chief Complaint: AMS  HPI: Jeffrey Trujillo is a 66 y.o. male with medical history significant of renal cell CA s/p nephrectomy with spread to the remaining kidney for which he is not being treated; iron deficiency anemia; stage 3 CKD; and an anal fistula presenting with AMS and severe pain - ?head and neck.   1-H/o neck problems for years.  Used to see orthopedics, Dr. Ace Gins; he had injections in neck and was on topamax.  The neck seems to be getting chronically worse, with pain into the back of his head over the last year.  Went to PCP and was given opiates and was also seen in the ER with neck pain on 10/29/16.   2- He also had a perirectal abscess with surgical repair on 11/13/15; [name] did the surgery initially but he was eventually sent to Dr. Marcello Moores at [facility] in [place].  He had another syurgery last year, had a deep abscess with a fistula and had a drain placed.  It took about 3 months to complete the drain removal.  He saw [name] in December and restarted the cancer medication (Cabometyx) in January.  Only took a few of them and then the incision on his bottom started acting up with leakage and pain with sitting.  About 2 weeks ago, he got up during the night and the small remaining wound was pouring blood.  Bleeding stopped and no further issues until 3 days ago.  Had some brown leakage out of the lesion, but just a small spot visible on the skin.    3- He did also go to the medical center last week for dysuria.  Told he did not have an infection "but his urine was thick" - was told to drink more water, "lay off [place]", and if drinking gatorade then dilute it.  He started himself on a leftover cephalosporin he had  at home and then was given pain medication and muscle relaxer.  No further complaints about the dysuria.    Yesterday, he began complaining of chronic severe headache and neck pain.  Also had urinary incontinence x 3-4 days.  Yesterday, he was able to talk to his wife.  He could move around and walk.  Last night, he was up during the night with head/neck pain, urinary incontinence, yelling "help me, help me, I hurt, I hurt."  As of 10am today, he wasn't coherent.  Sitting on toilet, drooling, unable to communicate what was wrong. He has had simliar MS changes in the past but never this severe.  Current AMS started this AM.  +chills, no fevers.     ED Course:  Concern for infection based on lactate 5.5 but no symptoms to indicate possible source.  No external abnormality or abscess appreciated on rectal but patient did moan and grimace.  Imaging studies pending.  Review of Systems: Unable to obtain   Ambulatory Status:  Ambulates without assistance  Past Medical History:  Diagnosis Date  . Anal fistula   . Anemia in chronic renal disease   . Barrett's esophagus    by EGD 11/02/2009  . Chronic pain   . CKD (chronic kidney disease), stage III   . Diverticulosis of colon    by colonoscopy  11/02/2009  . DJD (degenerative joint disease) of cervical spine   . GERD (gastroesophageal reflux disease)   . Gouty arthritis    per pt on 05-16-2016 stable (last bout bilatearl wrist 03/ 2017)  . History of diverticulitis of colon    w/ small perforation , no surgical intervention 06/ 2014  . History of peptic ulcer disease   . Iron deficiency anemia due to chronic blood loss   . 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 Nocturia   .  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 Wears glasses     Past Surgical History:  Procedure Laterality Date  . COLONOSCOPY  Feb 2011   [name]: single anal papilla, pan-colonic diverticula  . COLONOSCOPY  09/25/2012   RMR: Colonic diverticulosis. Suspect diverticular bleeding-hepatic flexure tic-sealed  . ESOPHAGOGASTRODUODENOSCOPY  Feb 2011   [name]: salmon-colored epithelium consistent with Barrett's, nodular antral erosions, small hiatal hernia, negative H.pylori  . ESOPHAGOGASTRODUODENOSCOPY  09/25/2012   [id number] esophagus. Small hiatal hernia. Antral erosions of doubtful clinical significance, no biopsies  . ESOPHAGOGASTRODUODENOSCOPY N/A 03/11/2013   SLF: Barrett's esophagus/Single ulcer in the gastric antrum-PROBABLE SOURCE FOR BLACK STOOL/ Chronic gastritis in the gastric antrum  . ESOPHAGOGASTRODUODENOSCOPY N/A 10/11/2014   Dr. [name] barrett's s/p biopsy. Hiatal hernia. Biopsy with intestinal metaplasia, glandular atypia indefinite for low grade dysplasia noted as well  . ESOPHAGOGASTRODUODENOSCOPY N/A 01/19/2015   [name]: Barrett's no dysplasia  . EUS  08/07/2012   Procedure: UPPER ENDOSCOPIC ULTRASOUND (EUS) LINEAR;  Surgeon: [name], MD;  Location: [facility] END[facility]COPY;  Service: Endoscopy;  Laterality: N/A;  . EUS  08/21/2012   Procedure: UPPER ENDOSCOPIC ULTRASOUND (EUS) LINEAR;  Surgeon: [name], MD;  Location: [facility] ENDOSCOPY;  Service: Endoscopy;  Laterality: N/A;  . INCISION AND DRAINAGE ABSCESS N/A 11/13/2015   Procedure: INCISION AND DRAINAGE ABSCESS;  Surgeon: Aviva Signs, MD;  Location: [facility] ORS;  Service: General;  Laterality: N/A;  . INGUINAL HERNIA REPAIR Bilateral 1990's  . LAPAROSCOPIC NEPHRECTOMY Right 06/27/2009   dr Alinda Money  . LUNG REMOVAL, PARTIAL  1970   left lower lobe, benign    Social History   Social History  . Marital status: Married    Spouse name: N/A  . Number of children: N/A  . Years of education: N/A    Occupational History  . retired [employer], [employer] in [place] History Main Topics  . Smoking status: Former Smoker    Packs/day: 0.25    Years: 4.00    Types: Cigars    Quit date: 05/16/2014  . Smokeless tobacco: Current User    Types: Chew     Comment: occasional cigar  . Alcohol use 2.4 - 3.0 oz/week    2 - 3 Cans of beer, 2 Shots of liquor per week     Comment: binges intermittently, 1/5 vodka daily until it makes him sick, last ETOH was about 3 months ago  . Drug use: No  . Sexual activity: Yes   Other Topics Concern  . Not on file   Social History Narrative  . No narrative on file  Allergies  Allergen Reactions  . Amoxicillin Nausea And Vomiting  . Nsaids Other (See Comments)    Patient only has one kidney.   Marland Kitchen Zofran [Ondansetron Hcl] Hives  . Demerol [Meperidine] Rash    Family History  Problem Relation Age of Onset  . Lung cancer Brother   . Prostate cancer Brother   . Lumbar disc disease Brother   . Prostate cancer Brother   . Colon cancer Neg Hx     Prior to Admission medications   Medication Sig Start Date End Date Taking? Authorizing Provider  acetaminophen (TYLENOL) 650 MG CR tablet Take 650 mg by mouth every 8 (eight) hours as needed for pain.   Yes Historical Provider, MD  baclofen (LIORESAL) 20 MG tablet Take 20 mg by mouth 2 (two) times daily.   Yes Historical Provider, MD  cefUROXime (CEFTIN) 250 MG tablet Take 250 mg by mouth 3 (three) times daily. 10 day course starting on 10/16/2016 10/16/16  Yes Historical Provider, MD  diphenhydrAMINE (BENADRYL) 25 MG tablet Take 25 mg by mouth daily as needed for itching. Reported on 02/26/2016   Yes Historical Provider, MD  docusate sodium (COLACE) 100 MG capsule Take 1 capsule (100 mg total) by mouth every 12 (twelve) hours. Patient taking differently: Take 100 mg by mouth 2 (two) times daily as needed for mild constipation.  02/26/16  Yes Merrily Pew, MD   oxyCODONE-acetaminophen (PERCOCET) 5-325 MG tablet Take 1-2 tablets by mouth every 4 (four) hours as needed. 10/17/16  Yes [name], MD  pantoprazole (PROTONIX) 40 MG tablet Take 1 tablet (40 mg total) by mouth 2 (two) times daily before a meal. 04/12/16  Yes Annitta Needs, NP  topiramate (TOPAMAX) 25 MG tablet Take 25 mg by mouth 2 (two) times daily.   Yes Historical Provider, MD  CABOMETYX 40 MG TABS TAKE 1 TABLET BY MOUTH ONE TIME DAILY WITH AT LEAST 8 OZ OF WATER ON AN EMPTY STOMACH. DO NOT EAT FOR 2 HOURS BEFORE OR 1 HOUR AFTER. [facility] Patient not taking: Reported on 11/15/2016 04/02/16   [name], MD  HYDROcodone-acetaminophen (NORCO/VICODIN) 5-325 MG tablet Take 1 tablet by mouth every 6 (six) hours as needed. Patient not taking: Reported on 11/15/2016 10/29/16   Lily Kocher, PA-C  predniSONE (DELTASONE) 10 MG tablet Take 2 tablets (20 mg total) by mouth daily. Patient not taking: Reported on 11/15/2016 10/17/16   [name], MD    Physical Exam: Vitals:   11/15/16 1530 11/15/16 1600 11/15/16 1630 11/15/16 1700  BP: 166/76  149/71 150/98  Pulse: 82 93 84 91  Resp: 20 22 15  (!) 27  Temp:      TempSrc:      SpO2: 95% 100% 94% 96%  Weight:      Height:         General: Intermittently sleeping, snoring with periods of apnea alternating with agitation, crying out in pain when disturbed, crying, "I'm sick, I'm so sick." Eyes:  PERRL, EOMI, normal lids, iris ENT:  grossly normal hearing, lips & tongue, mmm Neck:  no LAD, masses or thyromegaly Cardiovascular:  RRR, no m/r/g. No LE edema.  Respiratory:  CTA bilaterally, no w/r/r. Normal respiratory effort interspersed with periods of apnea. Abdomen:  soft, nd, NABS - patient did seem to cry out and grasp head/ears/neck with palpation of the abdomen with voluntary guarding and flexion of the hips into a pseudo-fetal position Skin:  no rash or induration seen on limited exam Musculoskeletal:  grossly  normal tone BUE/BLE, good ROM, no  bony abnormality Psychiatric: clearly altered, generally snoring but easily disturbed and then becomes quite agitated and cries out in pain and angst Neurologic: unable to assess  Labs on Admission: I have personally reviewed following labs and imaging studies  CBC:  Recent Labs Lab 11/09/16 1241 11/15/16 1150  WBC 7.3 6.9  NEUTROABS 6.0  --   HGB 9.1* 10.7*  HCT 28.8* 33.0*  MCV 91.4 93.2  PLT 152 [id number]   Basic Metabolic Panel:  Recent Labs Lab 11/09/16 1241 11/15/16 1150  NA 144 137  K 4.2 3.6  CL  --  103  CO2 28 27  GLUCOSE 89 106*  BUN 23.0 18  CREATININE 1.5* 1.26*  CALCIUM 9.7 9.1   GFR: Estimated Creatinine Clearance: 62.3 mL/min (by C-G formula based on SCr of 1.26 mg/dL (H)). Liver Function Tests:  Recent Labs Lab 11/09/16 1241 11/15/16 1150  AST 19 16  ALT 17 16*  ALKPHOS 80 56  BILITOT 0.46 1.0  PROT 7.4 7.0  ALBUMIN 3.5 3.4*   No results for input(s): LIPASE, AMYLASE in the last 168 hours. No results for input(s): AMMONIA in the last 168 hours. Coagulation Profile:  Recent Labs Lab 11/15/16 1150  INR 1.08   Cardiac Enzymes: No results for input(s): CKTOTAL, CKMB, CKMBINDEX, TROPONINI in the last 168 hours. BNP (last 3 results) No results for input(s): PROBNP in the last 8760 hours. HbA1C: No results for input(s): HGBA1C in the last 72 hours. CBG:  Recent Labs Lab 11/15/16 1152  GLUCAP 102*   Lipid Profile: No results for input(s): CHOL, HDL, LDLCALC, TRIG, CHOLHDL, LDLDIRECT in the last 72 hours. Thyroid Function Tests: No results for input(s): TSH, T4TOTAL, FREET4, T3FREE, THYROIDAB in the last 72 hours. Anemia Panel: No results for input(s): VITAMINB12, FOLATE, FERRITIN, TIBC, IRON, RETICCTPCT in the last 72 hours. Urine analysis:    Component Value Date/Time   COLORURINE STRAW (A) 11/15/2016 1350   APPEARANCEUR CLEAR 11/15/2016 1350   LABSPEC 1.008 11/15/2016 1350   PHURINE 6.0 11/15/2016 1350   GLUCOSEU NEGATIVE  11/15/2016 1350   HGBUR MODERATE (A) 11/15/2016 1350   BILIRUBINUR NEGATIVE 11/15/2016 1350   KETONESUR NEGATIVE 11/15/2016 1350   PROTEINUR NEGATIVE 11/15/2016 1350   UROBILINOGEN 4.0 (H) 02/11/2015 1409   NITRITE NEGATIVE 11/15/2016 1350   LEUKOCYTESUR NEGATIVE 11/15/2016 1350    Creatinine Clearance: Estimated Creatinine Clearance: 62.3 mL/min (by C-G formula based on SCr of 1.26 mg/dL (H)).  Sepsis Labs: @LABRCNTIP (procalcitonin:4,lacticidven:4) ) Recent Results (from the past 240 hour(s))  Blood Culture (routine x 2)     Status: None (Preliminary result)   Collection Time: 11/15/16  1:33 PM  Result Value Ref Range Status   Specimen Description RIGHT ANTECUBITAL  Final   Special Requests BOTTLES DRAWN AEROBIC AND ANAEROBIC 10CC EACH  Final   Culture PENDING  Incomplete   Report Status PENDING  Incomplete  Blood Culture (routine x 2)     Status: None (Preliminary result)   Collection Time: 11/15/16  1:37 PM  Result Value Ref Range Status   Specimen Description LEFT ANTECUBITAL  Final   Special Requests BOTTLES DRAWN AEROBIC ONLY 10 CC AEB  Final   Culture PENDING  Incomplete   Report Status PENDING  Incomplete     Radiological Exams on Admission: Ct Abdomen Pelvis Wo Contrast  Result Date: 11/15/2016 CLINICAL DATA:  Urinary urgency and incontinence. EXAM: CT ABDOMEN AND PELVIS WITHOUT CONTRAST TECHNIQUE: Multidetector CT imaging of the abdomen and  pelvis was performed following the standard protocol without IV contrast. COMPARISON:  CT 08/31/2016. FINDINGS: Lower chest: Breathing motion artifact but no obvious pulmonary lesion or acute pulmonary abnormality. The heart is normal in size. No pericardial effusion. Hepatobiliary: No focal hepatic lesions or intrahepatic biliary dilatation. The gallbladder is grossly normal. Pancreas: Stable lesion in the pancreatic body measuring 20 x 18 mm on image number 28. No ductal dilatation. Spleen: Normal size.  No focal lesions.  Adrenals/Urinary Tract: Solid left adrenal gland lesion measures 2 x 2 cm and previously measured 1.8 x 1.8 cm. The right adrenal gland is normal. Stable exophytic slightly complex left renal cyst. There is an enlarging solid upper pole Mass obliterating the upper pole collecting system. Expanded and hyperdense left pulmonary vein consistent with tumor invasion. No hydronephrosis. No ureteral or bladder calculi. The right kidney is surgically absent. Stomach/Bowel: The stomach, duodenum, small bowel and colon are grossly normal. No acute inflammatory changes, mass lesions or obstructive findings. Colonic diverticulosis is noted. Vascular/Lymphatic: The aorta and branch vessels are grossly normal without contrast. No aneurysm. No mesenteric or retroperitoneal mass or adenopathy. Reproductive: Surgical changes from TURP. Seminal vesicles are grossly normal. Other: No pelvic mass or adenopathy. No free pelvic fluid collections. No inguinal mass or adenopathy. No abdominal wall hernia or subcutaneous lesions. Musculoskeletal: No significant bony findings. No lytic/destructive bone lesions are identified. IMPRESSION: 1. Enlarging left upper pole solid renal mass measuring approximately 5.6 cm with evidence of tumor invading the proximal renal vein which is hyperdense and distended. 2. Slight interval enlargement of a left adrenal gland lesion. 3. Slight interval enlargement of the pancreatic body lesion. 4. No findings for metastatic disease involving the liver, lung bases or bony structures. 5. No renal, ureteral or bladder calculi. Electronically Signed   By: Marijo Sanes M.D.   On: 11/15/2016 17:03   Ct Head Wo Contrast  Result Date: 11/15/2016 CLINICAL DATA:  Altered mental status EXAM: CT HEAD WITHOUT CONTRAST TECHNIQUE: Contiguous axial images were obtained from the base of the skull through the vertex without intravenous contrast. COMPARISON:  None. FINDINGS: Brain: Image quality degraded by moderate motion.  Repeat attempts were attempted but there was excessive motion on the repeat images. Mild atrophy. Negative for hydrocephalus. Negative for hemorrhage or mass. No acute infarct identified. Vascular: No hyperdense vessel or unexpected calcification. Skull: Negative Sinuses/Orbits: Negative Other: None IMPRESSION: No acute abnormality.  Image quality degraded by moderate motion. Electronically Signed   By: Franchot Gallo M.D.   On: 11/15/2016 12:26   Dg Chest Port 1 View  Result Date: 11/15/2016 CLINICAL DATA:  Urinary tract infection with altered mental status. EXAM: PORTABLE CHEST 1 VIEW COMPARISON:  08/31/2016 chest CT, CXR 12/07/2015 FINDINGS: The heart size and mediastinal contours are within normal limits. Chronic blunting of the left costophrenic angle with postoperative deformity of the left posterior sixth rib in this patient who is reportedly status post partial pneumonectomy. Mild central vascular congestion. No pneumothorax or pneumonic consolidation. No acute nor worrisome osseous abnormalities. IMPRESSION: Postsurgical change of the left hemithorax. Central vascular congestion suggestive of mild CHF. Electronically Signed   By: Ashley Royalty M.D.   On: 11/15/2016 13:46    EKG: Independently reviewed.  NSR with rate 73;  no evidence of acute ischemia  Assessment/Plan Principal Problem:   Encephalopathy acute Active Problems:   Renal cell cancer (HCC)   Perirectal abscess   Anemia of chronic disease   -The patient has chronic neck and head pain which  may or may not be related to acute presentation of AMS -No infectious concern at this time - he has no h/o fever, no localizing symptoms (other than drainage from prior anal fistula, see below), and normal WBC count -UA: moderate Hgb, hyaline casts present, mucous present, TNTC RBC, otherwise negative -CXR unremarkable (possible mild volume overload but this is not present clinically) -ETOH <5 -Blood and urine cultures pending -Creatinine  stable -Hgb 10.7, baseline 9s -Interestingly, his lactate is 5.05.  While sepsis is a consideration, other causes of ischemia also bear consideration. -CT does not indicate colitis as would be expected with ischemic colitis.   -Abdominal pain is a concern on PE, but with his encephalopathy it is hard to say this for certain.   -His CT does clearly show progression of his malignancy with expansion into the proximal renal vein - which may explain his urinary symptoms and hematuria with normal creatinine and otherwise negative UA (although partially treated UTI is also a consideration since the patient started leftover antibiotics at home). -His CT also does not show concern for recurrence of his perirectal abscess, and his PE performed by Dr. Ashok Cordia was well documented and without concern. -Head CT was negative, but with his periods of apnea I am also very concerned about metastatic spread particularly into his brain stem.  MRI is pending. -For now, I have shared my concerns with the family and we are awaiting imaging results before meeting again to discuss our plan ahead. -His wife has noted that he would not want life support and so he will be DNR. -Will admit to SDU for now. -He is likely to need oncology evaluation - if not tonight telephonically then tomorrow AM. -Will rehydrate and trend lactate. -Procalcitonin is also pending.  If abnormal, will order the ICU procalcitonin algorithm. -Unfortunately, based on the current evaluation, the patient's prognosis appears to be quite poor and I would anticipate an in-hospital demise unless a reversible cause can be found.  DVT prophylaxis: Lovenox  Code Status: DNR - confirmed with family Family Communication: Wife and daughter present throughout evaluation; we will meet again once the MRI results are available Disposition Plan: To be determined. Consults called: None thus far.  Likely to need oncology input. Admission status: Admit - It is my  clinical opinion that admission to INPATIENT is reasonable and necessary because this patient will require at least 2 midnights in the hospital to treat this condition based on the medical complexity of the problems presented.  Given the aforementioned information, the predictability of an adverse outcome is felt to be significant.  Total critical care time: 85 minutes Critical care time was exclusive of separately billable procedures and treating other patients. Critical care was necessary to treat or prevent imminent or life-threatening deterioration. Critical care was time spent personally by me on the following activities: development of treatment plan with patient and/or surrogate as well as nursing, discussions with consultants, evaluation of patient's response to treatment, examination of patient, obtaining history from patient or surrogate, ordering and performing treatments and interventions, ordering and review of laboratory studies, ordering and review of radiographic studies, pulse oximetry and re-evaluation of patient's condition.    Karmen Bongo MD [facility]  If 7PM-7AM, please contact night-coverage [email or web address] Password [facility]  11/15/2016, 5:21 PM

## 2016-11-15 NOTE — Telephone Encounter (Signed)
"  This is [name] calling for Jeffrey Trujillo.  Tell [name] he has no control of his urine whatsoever for the past few days and only has one kidney.  He is throwing up.  Not very responsive so he's going to [facility]."  EMS has arrived.  Call ended.

## 2016-11-15 NOTE — Progress Notes (Addendum)
Unfortunately, the patient was unable to complete the MRI due to severe agitation.  When not disturbed, he sleeps soundly and snores comfortably with intermittent periods of apnea.  But when he is disturbed he begins to yell out "I'm sick!" and that he needs to urinate and tries to grab at his condom catheter.  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  Other than elevated lactate which has now normalized, there is little else to explain his symptoms.  I remain concern about spread to the brainstem, based on patient with progressive severe neck pain and occipital headache in conjunction with his periods of apnea in the ER.  We have not found a reversible cause for his delirium yet.  After further discussion with his wife, daughter, sister, and brother-in-law, they are aware of his very guarded prognosis and that he may not survive this hospitalization.  For now, we are in agreement with SDU admission overnight for close monitoring.  His wife has clearly stated that he would not desire resuscitation.  Their primary objective is for him to not suffer, but they would like to give him 24-48 hours to see if he shows improvement with pain control and IVF.  We are in agreement with the plan to give morphine pushes for now, but if this is insufficient to provide pain control then we will transition to a morphine drip.  There is currently no indication for antibiotics.  Depending on his progress, will consider oncology consultation tomorrow.  They are uncertain about hospice at this time.  They do desire Chaplain support.  Carlyon Shadow, M.D.

## 2016-11-15 NOTE — ED Provider Notes (Addendum)
[facility] DEPT Provider Note   CSN: [id number] Arrival date & time: 11/15/16  1122     History   Chief Complaint Chief Complaint  Patient presents with  . Altered Mental Status    HPI Jeffrey Trujillo is a 66 y.o. male.  Patient with altered mental status, decreased responsiveness.   Per ems report, spouse noted symptoms upon him waking up this AM - was last his normal self last night. Pt was found to be sitting on toilet w nv, when EMS arrived.  Pt currently not verbally responsive to questions -  Level 5 caveat.     Patients sister subserquently arrives, indicates he was at her home last evening  - states pt was c/o neck/head pain, but that this is not unusual for patient.  She also notes in past 2 weeks problems w not being able to hold urine ?urinary urgency/incont.  States pts wife indicated he had fallen out of chair last night. She indicates she was called to his home this AM due to nv, and helped pt to bathroom but that then he was too weak to get up under his own power.    The history is provided by the patient and the EMS personnel. The history is limited by the condition of the patient.  Altered Mental Status      Past Medical History:  Diagnosis Date  . Anal fistula   . Anemia in chronic renal disease   . Barrett's esophagus    by EGD 11/02/2009  . Chronic pain   . CKD (chronic kidney disease), stage III   . Diverticulosis of colon    by colonoscopy 11/02/2009  . DJD (degenerative joint disease) of cervical spine   . GERD (gastroesophageal reflux disease)   . Gouty arthritis    per pt on 05-16-2016 stable (last bout bilatearl wrist 03/ 2017)  . History of diverticulitis of colon    w/ small perforation , no surgical intervention 06/ 2014  . History of peptic ulcer disease   . Iron deficiency anemia due to chronic blood loss   . Metastatic renal cell carcinoma [facility]) oncologist-  dr Alen Blew--  currently on oral chemo -- RCC w/ mets to pancreas slow progression  per last ct 06/ 2017   dx 2010 right renal cell carcinoma (pT1b, Fuhrman grade 3/4) s/p  radical nephrectomy 06-27-2009/  dx left renal cell carcinoma w/ mets to pancreas 11/ 2013    . Nocturia   . Thrombocytopenia ([place])    secondary to oral chemo  . Wears glasses     Patient Active Problem List   Diagnosis Date Noted  . Fever 12/07/2015  . Gouty arthritis 12/07/2015  . Chest wall pain   . Fall   . Pyrexia   . Acute gout 11/15/2015  . Perirectal abscess 11/12/2015  . Hypokalemia 11/12/2015  . Anemia of chronic disease 11/12/2015  . Gallstones   . Sinus bradycardia 02/14/2015  . Malnutrition of moderate degree ([place]) 02/12/2015  . Elevated LFTs 02/12/2015  . Abdominal pain, right upper quadrant 02/12/2015  . Acute kidney injury ([place]) 02/12/2015  . Cholecystitis 02/11/2015  . Hiatal hernia   . Chest pain 04/15/2014  . Chest pain at rest 04/14/2014  . PUD (peptic ulcer disease) 04/21/2013  . Melena 03/10/2013  . Abdominal pain 03/10/2013  . Acute renal insufficiency 03/10/2013  . Leukopenia 03/10/2013  . Thrombocytopenia ([place]) 03/10/2013  . Abdominal abscess ([place]) 03/10/2013  . Anemia 03/10/2013  . ETOH abuse 03/10/2013  .  Bradycardia 03/10/2013  . Barrett's esophagus 01/04/2013  . Renal cell cancer ([place]) 09/25/2012  . Lower GI bleed 09/25/2012  . Anemia due to blood loss 09/25/2012  . Acute gout of right foot 09/25/2012  . DJD (degenerative joint disease) 09/25/2012  . Pancreatic mass 08/04/2012  . Acute strain of neck muscle 06/05/2011  . RECTAL BLEEDING 10/20/2009  . NAUSEA WITH VOMITING 10/20/2009  . DIARRHEA, BLOODY 10/20/2009    Past Surgical History:  Procedure Laterality Date  . COLONOSCOPY  Feb 2011   [name]: single anal papilla, pan-colonic diverticula  . COLONOSCOPY  09/25/2012   RMR: Colonic diverticulosis. Suspect diverticular bleeding-hepatic flexure tic-sealed  . ESOPHAGOGASTRODUODENOSCOPY  Feb 2011   [name]: salmon-colored epithelium  consistent with Barrett's, nodular antral erosions, small hiatal hernia, negative H.pylori  . ESOPHAGOGASTRODUODENOSCOPY  09/25/2012   HQI:ONGEXBM'W esophagus. Small hiatal hernia. Antral erosions of doubtful clinical significance, no biopsies  . ESOPHAGOGASTRODUODENOSCOPY N/A 03/11/2013   SLF: Barrett's esophagus/Single ulcer in the gastric antrum-PROBABLE SOURCE FOR BLACK STOOL/ Chronic gastritis in the gastric antrum  . ESOPHAGOGASTRODUODENOSCOPY N/A 10/11/2014   Dr. [name] barrett's s/p biopsy. Hiatal hernia. Biopsy with intestinal metaplasia, glandular atypia indefinite for low grade dysplasia noted as well  . ESOPHAGOGASTRODUODENOSCOPY N/A 01/19/2015   [name]: Barrett's no dysplasia  . EUS  08/07/2012   Procedure: UPPER ENDOSCOPIC ULTRASOUND (EUS) LINEAR;  Surgeon: [name], MD;  Location: [facility] END[facility]COPY;  Service: Endoscopy;  Laterality: N/A;  . EUS  08/21/2012   Procedure: UPPER ENDOSCOPIC ULTRASOUND (EUS) LINEAR;  Surgeon: [name], MD;  Location: [facility] ENDOSCOPY;  Service: Endoscopy;  Laterality: N/A;  . INCISION AND DRAINAGE ABSCESS N/A 11/13/2015   Procedure: INCISION AND DRAINAGE ABSCESS;  Surgeon: Aviva Signs, MD;  Location: [facility];  Service: General;  Laterality: N/A;  . INGUINAL HERNIA REPAIR Bilateral 1990's  . LAPAROSCOPIC NEPHRECTOMY Right 06/27/2009   dr Alinda Money  . LUNG REMOVAL, PARTIAL  1970   left lower lobe, benign       Home Medications    Prior to Admission medications   Medication Sig Start Date End Date Taking? Authorizing Provider  acetaminophen (TYLENOL) 650 MG CR tablet Take 650 mg by mouth every 8 (eight) hours as needed for pain.    Historical Provider, MD  CABOMETYX 40 MG TABS TAKE 1 TABLET BY MOUTH ONE TIME DAILY WITH AT LEAST 8 OZ OF WATER ON AN EMPTY STOMACH. DO NOT EAT FOR 2 HOURS BEFORE OR 1 HOUR AFTER. [facility] 04/02/16   [name], MD  cefUROXime (CEFTIN) 250 MG tablet Take 250 mg by mouth 3 (three) times daily. 10 day course  starting on 10/16/2016 10/16/16   Historical Provider, MD  diphenhydrAMINE (BENADRYL) 25 MG tablet Take 25 mg by mouth daily as needed for itching. Reported on 02/26/2016    Historical Provider, MD  docusate sodium (COLACE) 100 MG capsule Take 1 capsule (100 mg total) by mouth every 12 (twelve) hours. Patient taking differently: Take 100 mg by mouth 2 (two) times daily as needed for mild constipation.  02/26/16   Merrily Pew, MD  HYDROcodone-acetaminophen (NORCO/VICODIN) 5-325 MG tablet Take 1 tablet by mouth every 6 (six) hours as needed. 10/29/16   Lily Kocher, PA-C  indomethacin (INDOCIN) 50 MG capsule Take 40 mg by mouth every 8 (eight) hours as needed for moderate pain.    Historical Provider, MD  oxyCODONE-acetaminophen (PERCOCET) 5-325 MG tablet Take 1-2 tablets by mouth every 4 (four) hours as needed. 10/17/16   Aaron Edelman  Lacinda Axon, MD  pantoprazole (PROTONIX) 40 MG tablet Take 1 tablet (40 mg total) by mouth 2 (two) times daily before a meal. 04/12/16   Annitta Needs, NP  predniSONE (DELTASONE) 10 MG tablet Take 2 tablets (20 mg total) by mouth daily. 10/17/16   [name], MD  topiramate (TOPAMAX) 25 MG tablet Take 25 mg by mouth 2 (two) times daily.    Historical Provider, MD    Family History Family History  Problem Relation Age of Onset  . Lung cancer Brother   . Prostate cancer Brother   . Lumbar disc disease Brother   . Prostate cancer Brother   . Colon cancer Neg Hx     Social History Social History  Substance Use Topics  . Smoking status: Former Smoker    Packs/day: 0.25    Years: 4.00    Types: Cigars    Quit date: 05/16/2014  . Smokeless tobacco: Current User    Types: Chew     Comment: occasional cigar  . Alcohol use 2.4 - 3.0 oz/week    2 - 3 Cans of beer, 2 Shots of liquor per week     Comment: vodka and beer, sometimes daily,  couple drinks.& 2-3 beers on a weekend.     Allergies   Amoxicillin; Nsaids; Zofran [ondansetron hcl]; and Demerol [meperidine]   Review of  Systems Review of Systems  Unable to perform ROS: Patient unresponsive  pt not verbally responsive.      Physical Exam Updated Vital Signs BP 174/59   Pulse 62   Temp 98.3 F (36.8 C) (Oral)   Resp 13   Ht _0  (1.803 m)   Wt 79.4 kg   SpO2 98%   BMI 24.41 kg/m   Physical Exam  Constitutional: He appears well-developed and well-nourished. No distress.  HENT:  Head: Atraumatic.  Mouth/Throat: Oropharynx is clear and moist.  Eyes: EOM are normal. Pupils are equal, round, and reactive to light.  Neck: Neck supple. No tracheal deviation present.  No stiffness or rigidity. No bruits. During course of exam pt freely moves neck in all directions.   Cardiovascular: Normal rate, regular rhythm, normal heart sounds and intact distal pulses.  Exam reveals no gallop and no friction rub.   No murmur heard. Pulmonary/Chest: Effort normal and breath sounds normal. No accessory muscle usage. No respiratory distress.  Abdominal: Soft. Bowel sounds are normal. He exhibits no distension. There is no tenderness.  Genitourinary:  Genitourinary Comments: No cva tenderness. No buttock or perirectal abscess noted.   Musculoskeletal: He exhibits no edema.  CTLS spine, non tender, aligned, no step off. No focal bony tenderness.  Neurological:  Lying, eyes closed. Mumbles to verbal stimuli. Does not follow commands.  Moves bil extremities purposefully with good strength.   Skin: Skin is warm and dry. No rash noted. He is not diaphoretic.  Psychiatric:  Confused.   Nursing note and vitals reviewed.    ED Treatments / Results  Labs (all labs ordered are listed, but only abnormal results are displayed) Results for orders placed or performed during the hospital encounter of 11/15/16  Comprehensive metabolic panel  Result Value Ref Range   Sodium 137 135 - 145 mmol/L   Potassium 3.6 3.5 - 5.1 mmol/L   Chloride 103 101 - 111 mmol/L   CO2 27 22 - 32 mmol/L   Glucose, Bld 106 (H) 65 - 99  mg/dL   BUN 18 6 - 20 mg/dL   Creatinine, Ser 1.26 (H)  0.61 - 1.24 mg/dL   Calcium 9.1 8.9 - 10.3 mg/dL   Total Protein 7.0 6.5 - 8.1 g/dL   Albumin 3.4 (L) 3.5 - 5.0 g/dL   AST 16 15 - 41 U/L   ALT 16 (L) 17 - 63 U/L   Alkaline Phosphatase 56 38 - 126 U/L   Total Bilirubin 1.0 0.3 - 1.2 mg/dL   GFR calc non Af Amer 58 (L) >60 mL/min   GFR calc Af Amer >60 >60 mL/min   Anion gap 7 5 - 15  CBC  Result Value Ref Range   WBC 6.9 4.0 - 10.5 K/uL   RBC 3.54 (L) 4.22 - 5.81 MIL/uL   Hemoglobin 10.7 (L) 13.0 - 17.0 g/dL   HCT 33.0 (L) 39.0 - 52.0 %   MCV 93.2 78.0 - 100.0 fL   MCH 30.2 26.0 - 34.0 pg   MCHC 32.4 30.0 - 36.0 g/dL   RDW 17.2 (H) 11.5 - 15.5 %   Platelets 165 150 - 400 K/uL  Protime-INR  Result Value Ref Range   Prothrombin Time 14.0 11.4 - 15.2 seconds   INR 1.08   Urinalysis, Routine w reflex microscopic  Result Value Ref Range   Color, Urine STRAW (A) YELLOW   APPearance CLEAR CLEAR   Specific Gravity, Urine 1.008 1.005 - 1.030   pH 6.0 5.0 - 8.0   Glucose, UA NEGATIVE NEGATIVE mg/dL   Hgb urine dipstick MODERATE (A) NEGATIVE   Bilirubin Urine NEGATIVE NEGATIVE   Ketones, ur NEGATIVE NEGATIVE mg/dL   Protein, ur NEGATIVE NEGATIVE mg/dL   Nitrite NEGATIVE NEGATIVE   Leukocytes, UA NEGATIVE NEGATIVE   RBC / HPF TOO NUMEROUS TO COUNT 0 - 5 RBC/hpf   WBC, UA 0-5 0 - 5 WBC/hpf   Bacteria, UA NONE SEEN NONE SEEN   Mucous PRESENT    Hyaline Casts, UA PRESENT   Ethanol  Result Value Ref Range   Alcohol, Ethyl (B) <5 <5 mg/dL  CBG monitoring, ED  Result Value Ref Range   Glucose-Capillary 102 (H) 65 - 99 mg/dL  I-Stat CG4 Lactic Acid, ED  Result Value Ref Range   Lactic Acid, Venous 5.05 (HH) 0.5 - 1.9 mmol/L   Comment NOTIFIED PHYSICIAN   I-stat troponin, ED  Result Value Ref Range   Troponin i, poc 0.00 0.00 - 0.08 ng/mL   Comment 3           Ct Head Wo Contrast  Result Date: 11/15/2016 CLINICAL DATA:  Altered mental status EXAM: CT HEAD WITHOUT  CONTRAST TECHNIQUE: Contiguous axial images were obtained from the base of the skull through the vertex without intravenous contrast. COMPARISON:  None. FINDINGS: Brain: Image quality degraded by moderate motion. Repeat attempts were attempted but there was excessive motion on the repeat images. Mild atrophy. Negative for hydrocephalus. Negative for hemorrhage or mass. No acute infarct identified. Vascular: No hyperdense vessel or unexpected calcification. Skull: Negative Sinuses/Orbits: Negative Other: None IMPRESSION: No acute abnormality.  Image quality degraded by moderate motion. Electronically Signed   By: Franchot Gallo M.D.   On: 11/15/2016 12:26   Dg Chest Port 1 View  Result Date: 11/15/2016 CLINICAL DATA:  Urinary tract infection with altered mental status. EXAM: PORTABLE CHEST 1 VIEW COMPARISON:  08/31/2016 chest CT, CXR 12/07/2015 FINDINGS: The heart size and mediastinal contours are within normal limits. Chronic blunting of the left costophrenic angle with postoperative deformity of the left posterior sixth rib in this patient who is reportedly status post  partial pneumonectomy. Mild central vascular congestion. No pneumothorax or pneumonic consolidation. No acute nor worrisome osseous abnormalities. IMPRESSION: Postsurgical change of the left hemithorax. Central vascular congestion suggestive of mild CHF. Electronically Signed   By: Ashley Royalty M.D.   On: 11/15/2016 13:46    EKG  EKG Interpretation  Date/Time:  Thursday November 15 2016 11:25:35 EST Ventricular Rate:  73 PR Interval:    QRS Duration: 89 QT Interval:  377 QTC Calculation: 416 R Axis:   9 Text Interpretation:  Sinus rhythm No significant change since last tracing Confirmed by Ashok Cordia  MD, Lennette Bihari ([id number]) on 11/15/2016 1:50:40 PM       Radiology Ct Head Wo Contrast  Result Date: 11/15/2016 CLINICAL DATA:  Altered mental status EXAM: CT HEAD WITHOUT CONTRAST TECHNIQUE: Contiguous axial images were obtained from the base of  the skull through the vertex without intravenous contrast. COMPARISON:  None. FINDINGS: Brain: Image quality degraded by moderate motion. Repeat attempts were attempted but there was excessive motion on the repeat images. Mild atrophy. Negative for hydrocephalus. Negative for hemorrhage or mass. No acute infarct identified. Vascular: No hyperdense vessel or unexpected calcification. Skull: Negative Sinuses/Orbits: Negative Other: None IMPRESSION: No acute abnormality.  Image quality degraded by moderate motion. Electronically Signed   By: Franchot Gallo M.D.   On: 11/15/2016 12:26   Dg Chest Port 1 View  Result Date: 11/15/2016 CLINICAL DATA:  Urinary tract infection with altered mental status. EXAM: PORTABLE CHEST 1 VIEW COMPARISON:  08/31/2016 chest CT, CXR 12/07/2015 FINDINGS: The heart size and mediastinal contours are within normal limits. Chronic blunting of the left costophrenic angle with postoperative deformity of the left posterior sixth rib in this patient who is reportedly status post partial pneumonectomy. Mild central vascular congestion. No pneumothorax or pneumonic consolidation. No acute nor worrisome osseous abnormalities. IMPRESSION: Postsurgical change of the left hemithorax. Central vascular congestion suggestive of mild CHF. Electronically Signed   By: Ashley Royalty M.D.   On: 11/15/2016 13:46    Procedures Procedures (including critical care time)  Medications Ordered in ED Medications  0.9 %  sodium chloride infusion (not administered)     Initial Impression / Assessment and Plan / ED Course  I have reviewed the triage vital signs and the nursing notes.  Pertinent labs & imaging results that were available during my care of the patient were reviewed by me and considered in my medical decision making (see chart for details).  I personally performed the services described in this documentation, which was scribed in my presence. The recorded information has been reviewed and  considered. Lajean Saver, MD  Reviewed nursing notes and prior charts for additional history.   CT head neg acute.   Lactate is very high. Pt afeb. Recent urinary symptoms, but no clear source of infection.  Iv ns boluses. Cultures sent. Iv abx.   Recheck, pt sitting upright, more alert, but not at baseline. abd soft nt. Chest ct.   Sister noted hx met kidney ca, and one kidney left.  ua pending.   Hospitalist consulted for admission.  Given hx renal cell ca, change in ms, may need additional brain imaging/mr.  Spouse arrives, indicates in past year, recurrent problems w pelvic and ?perirectal abscess, ? Recent drainage.  On exam, no external/abscess noted. Pt just moans/grimaces on rectal exam.  Will get imaging.  Spouse also states for 'long time'/many weeks has c/o worsening neck and base head pain. Unclear whether that pain worse w past week or few  days given pts mental state - will get add mr imaging to workup.  Hospitalists consulted for admission.  Discussed with Dr Lorin Mercy, Hospitalist, that imaging studies are pending, elev lact, etc  - she will admit.    Final Clinical Impressions(s) / ED Diagnoses   Final diagnoses:  None    New Prescriptions New Prescriptions   No medications on file         Lajean Saver, MD 11/15/16 1535

## 2016-11-15 NOTE — ED Triage Notes (Addendum)
Pt brought in by [facility]. Pt's wife reports to EMS that pt is lethargic, AMS that started this morning upon waking around 0800. Pt normally alert, oriented, ambulatory. Pt c/o head, neck pain - wife reports to EMS that this is chronic. Pt oriented to self only. Disoriented to place and time. Denies fever. When EMS arrived pt was found to be sitting on the toilet vomiting bile. Pt denies any falls. EMS reports pt was very lethargic on the ride to ED and had a very hard time arousing pt.

## 2016-11-15 NOTE — Progress Notes (Signed)
Pharmacy Antibiotic Note  Jeffrey Trujillo is a 66 y.o. male admitted on 11/15/2016 with sepsis.  Pharmacy has been consulted for Vancomycin and Zosyn dosing.  Plan: Vancomycin 1500 mg IV loading dose, then Vancomycin 1 GM IV every 12 hours.  Goal trough 15-20 mcg/mL. Zosyn 3.375g IV q8h (4 hour infusion).  Labs per protocol  Height: 5\' 11"  (180.3 cm) Weight: 175 lb (79.4 kg) IBW/kg (Calculated) : 75.3  Temp (24hrs), Avg:98.7 F (37.1 C), Min:98.3 F (36.8 C), Max:99 F (37.2 C)   Recent Labs Lab 11/09/16 1241 11/15/16 1150 11/15/16 1306  WBC 7.3 6.9  --   CREATININE 1.5* 1.26*  --   LATICACIDVEN  --   --  5.05*    Estimated Creatinine Clearance: 62.3 mL/min (by C-G formula based on SCr of 1.26 mg/dL (H)).    Allergies  Allergen Reactions  . Amoxicillin Nausea And Vomiting  . Nsaids Other (See Comments)    Patient only has one kidney.   Marland Kitchen Zofran [Ondansetron Hcl] Hives  . Demerol [Meperidine] Rash    Antimicrobials this admission: Vancomycin 3/1 >>  Zosyn 3/1 >>   Dose adjustments this admission:   Microbiology results:   Thank you for allowing pharmacy to be a part of this patient's care.  Abner Greenspan, Mandolin Falwell Bennett 11/15/2016 1:40 PM

## 2016-11-16 ENCOUNTER — Encounter (HOSPITAL_COMMUNITY): Payer: Self-pay | Admitting: Primary Care

## 2016-11-16 DIAGNOSIS — D638 Anemia in other chronic diseases classified elsewhere: Secondary | ICD-10-CM

## 2016-11-16 DIAGNOSIS — Z515 Encounter for palliative care: Secondary | ICD-10-CM

## 2016-11-16 DIAGNOSIS — G8929 Other chronic pain: Secondary | ICD-10-CM

## 2016-11-16 DIAGNOSIS — M542 Cervicalgia: Secondary | ICD-10-CM

## 2016-11-16 DIAGNOSIS — Z7189 Other specified counseling: Secondary | ICD-10-CM

## 2016-11-16 DIAGNOSIS — C649 Malignant neoplasm of unspecified kidney, except renal pelvis: Secondary | ICD-10-CM

## 2016-11-16 LAB — BASIC METABOLIC PANEL
Anion gap: 7 (ref 5–15)
BUN: 15 mg/dL (ref 6–20)
CALCIUM: 8.7 mg/dL — AB (ref 8.9–10.3)
CO2: 25 mmol/L (ref 22–32)
CREATININE: 1.17 mg/dL (ref 0.61–1.24)
Chloride: 107 mmol/L (ref 101–111)
GFR calc Af Amer: >60 mL/min (ref >60)
Glucose, Bld: 94 mg/dL (ref 65–99)
Potassium: 3.7 mmol/L (ref 3.5–5.1)
SODIUM: 139 mmol/L (ref 135–145)

## 2016-11-16 LAB — CBC
HCT: 30.6 % — ABNORMAL LOW (ref 39.0–52.0)
Hemoglobin: 9.8 g/dL — ABNORMAL LOW (ref 13.0–17.0)
MCH: 29.5 pg (ref 26.0–34.0)
MCHC: 32 g/dL (ref 30.0–36.0)
MCV: 92.2 fL (ref 78.0–100.0)
PLATELETS: 130 10*3/uL — AB (ref 150–400)
RBC: 3.32 MIL/uL — AB (ref 4.22–5.81)
RDW: 16.9 % — ABNORMAL HIGH (ref 11.5–15.5)
WBC: 7.6 10*3/uL (ref 4.0–10.5)

## 2016-11-16 MED ORDER — HYDROMORPHONE HCL 1 MG/ML IJ SOLN
1.0000 mg | Freq: Once | INTRAMUSCULAR | Status: AC
Start: 2016-11-16 — End: 2016-11-16
  Administered 2016-11-16: 1 mg via INTRAVENOUS

## 2016-11-16 MED ORDER — VANCOMYCIN HCL IN DEXTROSE 1-5 GM/200ML-% IV SOLN
1000.0000 mg | Freq: Two times a day (BID) | INTRAVENOUS | Status: DC
  Administered 2016-11-16 – 2016-11-18 (×4): 1000 mg via INTRAVENOUS
  Filled 2016-11-16 (×4): qty 200

## 2016-11-16 MED ORDER — DEXAMETHASONE SODIUM PHOSPHATE 10 MG/ML IJ SOLN
10.0000 mg | INTRAMUSCULAR | Status: AC
  Administered 2016-11-16: 10 mg via INTRAVENOUS
  Filled 2016-11-16: qty 1

## 2016-11-16 MED ORDER — HYDROMORPHONE HCL 1 MG/ML IJ SOLN
1.0000 mg | INTRAMUSCULAR | Status: DC | PRN
  Administered 2016-11-16 – 2016-11-17 (×4): 1 mg via INTRAVENOUS
  Filled 2016-11-16 (×6): qty 1

## 2016-11-16 MED ORDER — DEXTROSE 5 % IV SOLN
2.0000 g | Freq: Two times a day (BID) | INTRAVENOUS | Status: DC
  Administered 2016-11-16 – 2016-11-18 (×5): 2 g via INTRAVENOUS
  Filled 2016-11-16 (×7): qty 2

## 2016-11-16 MED ORDER — HALOPERIDOL LACTATE 5 MG/ML IJ SOLN
2.0000 mg | INTRAMUSCULAR | Status: DC | PRN
Start: 2016-11-16 — End: 2016-11-16
  Administered 2016-11-16 (×2): 2 mg via INTRAVENOUS
  Filled 2016-11-16 (×2): qty 1

## 2016-11-16 MED ORDER — DEXAMETHASONE SODIUM PHOSPHATE 10 MG/ML IJ SOLN
10.0000 mg | Freq: Four times a day (QID) | INTRAMUSCULAR | Status: DC
  Administered 2016-11-16 – 2016-11-18 (×7): 10 mg via INTRAVENOUS
  Filled 2016-11-16 (×12): qty 1

## 2016-11-16 MED ORDER — HYDROMORPHONE HCL 1 MG/ML IJ SOLN
1.0000 mg | INTRAMUSCULAR | Status: DC | PRN
  Filled 2016-11-16: qty 1

## 2016-11-16 MED ORDER — SODIUM CHLORIDE 0.9 % IV SOLN
1500.0000 mg | Freq: Once | INTRAVENOUS | Status: AC
  Administered 2016-11-16: 1500 mg via INTRAVENOUS
  Filled 2016-11-16: qty 1500

## 2016-11-16 MED ORDER — HALOPERIDOL LACTATE 5 MG/ML IJ SOLN: 5.0000 mg | INTRAMUSCULAR | Status: DC | PRN

## 2016-11-16 MED ORDER — HALOPERIDOL LACTATE 5 MG/ML IJ SOLN
2.0000 mg | INTRAMUSCULAR | Status: DC | PRN
Start: 2016-11-16 — End: 2016-11-18

## 2016-11-16 MED ORDER — AMPICILLIN SODIUM 2 G IJ SOLR
2.0000 g | INTRAMUSCULAR | Status: DC
  Administered 2016-11-16 – 2016-11-18 (×13): 2 g via INTRAVENOUS
  Filled 2016-11-16 (×20): qty 2000

## 2016-11-16 MED ORDER — HYDROMORPHONE HCL 1 MG/ML IJ SOLN: 1.0000 mg | INTRAMUSCULAR | Status: DC | PRN

## 2016-11-16 MED ORDER — HYDROMORPHONE HCL 1 MG/ML IJ SOLN
1.0000 mg | INTRAMUSCULAR | Status: DC | PRN
  Administered 2016-11-16 (×3): 1 mg via INTRAVENOUS
  Filled 2016-11-16 (×4): qty 1

## 2016-11-16 NOTE — Progress Notes (Signed)
PROGRESS NOTE                                                                                                                                                                                                             Patient Demographics:    Jeffrey Trujillo, is a 66 y.o. male, DOB - 01/24/1951, [id number]  Admit date - 11/15/2016   Admitting Physician Karmen Bongo, MD  Outpatient Primary MD for the patient is [facility]  LOS - 1  Outpatient Specialists: [facility] Alen Blew.  Chief Complaint  Patient presents with  . Altered Mental Status       Brief Narrative   66 y.o. male with medical history significant of renal cell CA s/p nephrectomy with spread to the remaining kidney for which he is not being treated; iron deficiency anemia; stage 3 CKD; and an anal fistula presenting with AMS and severe pain .   Subjective:    Jeffrey Trujillo today Lethargic, mainly nonverbal, but never wakes up complaining of significant pain.   Assessment  & Plan :    Principal Problem:   Encephalopathy acute Active Problems:   Renal cell cancer ([place])   Perirectal abscess   Anemia of chronic disease  Acute encephalopathy - Patient with acute worsening mental status over last 48 hours, CT head with no acute findings but poor quality, MRI brain could not be performed due to pain and mental status. - At this point no clear etiology, but wide differentials include brain/brainstem metastasis, less likely leptomeningeal metastasis(not, metastasis aside with renal cell tumor as discussed with oncology [name]), even less likely infectious process including encephalitis/meningitis especially with no fever or white count. - Discussed goals of care with multiple family members, patient is DO NOT RESUSCITATE, patient do not wish for any aggressive interventions including LP or myelogram, they want to proceed to MRI when pain and mental status  is more appropriate, begin a continue with medical management including antibiotics, imaging and blood work as needed, and main concern for the family is pain control, discussed with them significant amount of pain medicine needs to control his pain, and regarding pain medicine they want to focus on comfort, medical management and to reevaluate in 4-48 hours, so will continue with when necessary Dilaudid 1 mg every hour, and Haldol when necessary, and  palliative medicine has been consulted as well for assistance. - Patient has kept on this ampicillin, vancomycin and Rocephin, given though low clinical suspicion of meningitis, but it can't be ruled out until LP is performed, which family don't want to be done at this point.  Cancer related pain - Patient with significant headache, generalized pain, cervicothoracic pain, on when necessary Dilaudid, as well as started on large dose Decadron in case he has metastatic bone disease.  Renal cell cancer - Patient followed by Dr.Shadad, chemotherapy is currently on hold given rectal abscess, D/W oncology, it is uncommon for leptomeningeal spread for renal cell cancer, but it can has a similar presentation.  Anemia of chronic disease - Continue to monitor closely  Goals of care - Please see above discussion  Code Status : DO NOT RESUSCITATE  Family Communication  : Discussed with multiple family members at bedside including wife and sister  Disposition Plan  : pending further work up.  Consults  :  Palliative  Procedures  : None  DVT Prophylaxis  :  Lovenox   Lab Results  Component Value Date   PLT 130 (L) 11/16/2016    Antibiotics  :    Anti-infectives    Start     Dose/Rate Route Frequency Ordered Stop   11/16/16 2200  vancomycin (VANCOCIN) IVPB 1000 mg/200 mL premix     1,000 mg 200 mL/hr over 60 Minutes Intravenous Every 12 hours 11/16/16 1015     11/16/16 1000  cefTRIAXone (ROCEPHIN) 2 g in dextrose 5 % 50 mL IVPB     2 g 100  mL/hr over 30 Minutes Intravenous Every 12 hours 11/16/16 0847     11/16/16 0900  ampicillin (OMNIPEN) 2 g in sodium chloride 0.9 % 50 mL IVPB     2 g 150 mL/hr over 20 Minutes Intravenous Every 4 hours 11/16/16 0847     11/16/16 0900  vancomycin (VANCOCIN) 1,500 mg in sodium chloride 0.9 % 500 mL IVPB     1,500 mg 250 mL/hr over 120 Minutes Intravenous  Once 11/16/16 0847 11/16/16 1231   11/16/16 0200  vancomycin (VANCOCIN) IVPB 1000 mg/200 mL premix  Status:  Discontinued     1,000 mg 200 mL/hr over 60 Minutes Intravenous Every 12 hours 11/15/16 1339 11/15/16 1955   11/15/16 2200  piperacillin-tazobactam (ZOSYN) IVPB 3.375 g  Status:  Discontinued     3.375 g 12.5 mL/hr over 240 Minutes Intravenous Every 8 hours 11/15/16 1338 11/15/16 1955   11/15/16 1345  vancomycin (VANCOCIN) 1,500 mg in sodium chloride 0.9 % 500 mL IVPB     1,500 mg 250 mL/hr over 120 Minutes Intravenous  Once 11/15/16 1330 11/15/16 1646   11/15/16 1330  piperacillin-tazobactam (ZOSYN) IVPB 3.375 g     3.375 g 100 mL/hr over 30 Minutes Intravenous  Once 11/15/16 1322 11/15/16 1413   11/15/16 1330  vancomycin (VANCOCIN) IVPB 1000 mg/200 mL premix  Status:  Discontinued     1,000 mg 200 mL/hr over 60 Minutes Intravenous  Once 11/15/16 1322 11/15/16 1330        Objective:   Vitals:   11/16/16 0000 11/16/16 0100 11/16/16 0200 11/16/16 0300  BP: (!) 178/87 (!) 161/61 (!) 195/83 (!) 176/92  Pulse: 66 74 86 75  Resp: 16 15 (!) 22 19  Temp:      TempSrc:      SpO2: 100% 100% 100% 100%  Weight:      Height:  Wt Readings from Last 3 Encounters:  11/15/16 79.4 kg (175 lb)  11/09/16 79.7 kg (175 lb 12.8 oz)  10/29/16 82.6 kg (182 lb)     Intake/Output Summary (Last 24 hours) at 11/16/16 1245 Last data filed at 11/15/16 2300  Gross per 24 hour  Intake             3550 ml  Output             2350 ml  Net             1200 ml     Physical Exam  Lethargic, Responds occasionally to [place] or no question,  morning occasionally  Bilateral pupils are constricted , but mildly reactive  Symmetrical Chest wall movement, Good air movement bilaterallNo wheezing Regular rate and rhythm,No Gallops,Rubs or new Murmurs, No Parasternal Heave +ve B.Sounds, Abd Soft, No tenderness,  No rebound - guarding or rigidity. No Cyanosis, Clubbing or edema,  - Very difficult to perform appropriate neurological exam giving his significant pain and is morning even to touch, could not even turn the patient, but lower extremities appear to be grossly intact, bilateral upper extremity and consistent physical exam .    Data Review:    CBC  Recent Labs Lab 11/15/16 1150 11/16/16 0532  WBC 6.9 7.6  HGB 10.7* 9.8*  HCT 33.0* 30.6*  PLT 165 130*  MCV 93.2 92.2  MCH 30.2 29.5  MCHC 32.4 32.0  RDW 17.2* 16.9*    Chemistries   Recent Labs Lab 11/15/16 1150 11/16/16 0532  NA 137 139  K 3.6 3.7  CL 103 107  CO2 27 25  GLUCOSE 106* 94  BUN 18 15  CREATININE 1.26* 1.17  CALCIUM 9.1 8.7*  AST 16  --   ALT 16*  --   ALKPHOS 56  --   BILITOT 1.0  --    ------------------------------------------------------------------------------------------------------------------ No results for input(s): CHOL, HDL, LDLCALC, TRIG, CHOLHDL, LDLDIRECT in the last 72 hours.  No results found for: HGBA1C ------------------------------------------------------------------------------------------------------------------ No results for input(s): TSH, T4TOTAL, T3FREE, THYROIDAB in the last 72 hours.  Invalid input(s): FREET3 ------------------------------------------------------------------------------------------------------------------ No results for input(s): VITAMINB12, FOLATE, FERRITIN, TIBC, IRON, RETICCTPCT in the last 72 hours.  Coagulation profile  Recent Labs Lab 11/15/16 1150  INR 1.08    No results for input(s): DDIMER in the last 72 hours.  Cardiac Enzymes No results for input(s): CKMB, TROPONINI,  MYOGLOBIN in the last 168 hours.  Invalid input(s): CK ------------------------------------------------------------------------------------------------------------------    Component Value Date/Time   BNP 57.0 01/03/2015 1043    Inpatient Medications  Scheduled Meds: . ampicillin (OMNIPEN) IV  2 g Intravenous Q4H  . cefTRIAXone (ROCEPHIN)  IV  2 g Intravenous Q12H  . dexamethasone  10 mg Intravenous Q6H  . enoxaparin (LOVENOX) injection  40 mg Subcutaneous Q24H  . sodium chloride flush  3 mL Intravenous Q12H  . vancomycin  1,000 mg Intravenous Q12H   Continuous Infusions: . lactated ringers 100 mL/hr at 11/16/16 0802   PRN Meds:.acetaminophen **OR** acetaminophen, haloperidol lactate, HYDROmorphone (DILAUDID) injection  Micro Results Recent Results (from the past 240 hour(s))  Blood Culture (routine x 2)     Status: None (Preliminary result)   Collection Time: 11/15/16  1:33 PM  Result Value Ref Range Status   Specimen Description RIGHT ANTECUBITAL  Final   Special Requests BOTTLES DRAWN AEROBIC AND ANAEROBIC 10CC EACH  Final   Culture NO GROWTH < 24 HOURS  Final   Report Status PENDING  Incomplete  Blood Culture (routine x 2)     Status: None (Preliminary result)   Collection Time: 11/15/16  1:37 PM  Result Value Ref Range Status   Specimen Description LEFT ANTECUBITAL  Final   Special Requests BOTTLES DRAWN AEROBIC ONLY 10 CC AEB  Final   Culture NO GROWTH < 24 HOURS  Final   Report Status PENDING  Incomplete  MRSA PCR Screening     Status: None   Collection Time: 11/15/16  8:02 PM  Result Value Ref Range Status   MRSA by PCR NEGATIVE NEGATIVE Final    Comment:        The GeneXpert MRSA Assay (FDA approved for NASAL specimens only), is one component of a comprehensive MRSA colonization surveillance program. It is not intended to diagnose MRSA infection nor to guide or monitor treatment for MRSA infections.     Radiology Reports Ct Abdomen Pelvis Wo  Contrast  Result Date: 11/15/2016 CLINICAL DATA:  Urinary urgency and incontinence. EXAM: CT ABDOMEN AND PELVIS WITHOUT CONTRAST TECHNIQUE: Multidetector CT imaging of the abdomen and pelvis was performed following the standard protocol without IV contrast. COMPARISON:  CT 08/31/2016. FINDINGS: Lower chest: Breathing motion artifact but no obvious pulmonary lesion or acute pulmonary abnormality. The heart is normal in size. No pericardial effusion. Hepatobiliary: No focal hepatic lesions or intrahepatic biliary dilatation. The gallbladder is grossly normal. Pancreas: Stable lesion in the pancreatic body measuring 20 x 18 mm on image number 28. No ductal dilatation. Spleen: Normal size.  No focal lesions. Adrenals/Urinary Tract: Solid left adrenal gland lesion measures 2 x 2 cm and previously measured 1.8 x 1.8 cm. The right adrenal gland is normal. Stable exophytic slightly complex left renal cyst. There is an enlarging solid upper pole Mass obliterating the upper pole collecting system. Expanded and hyperdense left pulmonary vein consistent with tumor invasion. No hydronephrosis. No ureteral or bladder calculi. The right kidney is surgically absent. Stomach/Bowel: The stomach, duodenum, small bowel and colon are grossly normal. No acute inflammatory changes, mass lesions or obstructive findings. Colonic diverticulosis is noted. Vascular/Lymphatic: The aorta and branch vessels are grossly normal without contrast. No aneurysm. No mesenteric or retroperitoneal mass or adenopathy. Reproductive: Surgical changes from TURP. Seminal vesicles are grossly normal. Other: No pelvic mass or adenopathy. No free pelvic fluid collections. No inguinal mass or adenopathy. No abdominal wall hernia or subcutaneous lesions. Musculoskeletal: No significant bony findings. No lytic/destructive bone lesions are identified. IMPRESSION: 1. Enlarging left upper pole solid renal mass measuring approximately 5.6 cm with evidence of tumor  invading the proximal renal vein which is hyperdense and distended. 2. Slight interval enlargement of a left adrenal gland lesion. 3. Slight interval enlargement of the pancreatic body lesion. 4. No findings for metastatic disease involving the liver, lung bases or bony structures. 5. No renal, ureteral or bladder calculi. Electronically Signed   By: Marijo Sanes M.D.   On: 11/15/2016 17:03   Ct Head Wo Contrast  Result Date: 11/15/2016 CLINICAL DATA:  Altered mental status EXAM: CT HEAD WITHOUT CONTRAST TECHNIQUE: Contiguous axial images were obtained from the base of the skull through the vertex without intravenous contrast. COMPARISON:  None. FINDINGS: Brain: Image quality degraded by moderate motion. Repeat attempts were attempted but there was excessive motion on the repeat images. Mild atrophy. Negative for hydrocephalus. Negative for hemorrhage or mass. No acute infarct identified. Vascular: No hyperdense vessel or unexpected calcification. Skull: Negative Sinuses/Orbits: Negative Other: None IMPRESSION: No acute abnormality.  Image quality degraded  by moderate motion. Electronically Signed   By: Franchot Gallo M.D.   On: 11/15/2016 12:26   Dg Chest Port 1 View  Result Date: 11/15/2016 CLINICAL DATA:  Urinary tract infection with altered mental status. EXAM: PORTABLE CHEST 1 VIEW COMPARISON:  08/31/2016 chest CT, CXR 12/07/2015 FINDINGS: The heart size and mediastinal contours are within normal limits. Chronic blunting of the left costophrenic angle with postoperative deformity of the left posterior sixth rib in this patient who is reportedly status post partial pneumonectomy. Mild central vascular congestion. No pneumothorax or pneumonic consolidation. No acute nor worrisome osseous abnormalities. IMPRESSION: Postsurgical change of the left hemithorax. Central vascular congestion suggestive of mild CHF. Electronically Signed   By: Ashley Royalty M.D.   On: 11/15/2016 13:46    Time Spent in minutes  35  minutes   ELGERGAWY, DAWOOD M.D on 11/16/2016 at 12:45 PM  Between 7am to 7pm - Pager - [phone]  After 7pm go to [email or web address] - password [facility]  [facility] -  Office  [phone]

## 2016-11-16 NOTE — Progress Notes (Signed)
MD called and asked to come see the patient for increased pain and agitation. Although Pt said he was in pain doctor treated it as agitation and confusion only. I asked for clarification of "comfort care" and morphine gtt that Dr. Lorin Mercy wanted to progress to.  Dr. Marin Comment did not want to start the morphine gtt at this time because he did not feel comfortable and felt that he wasn't in pain although pain is subjective and pt has complained of severe pain. I'm making note of this because the charge nurse and I both have advocated for this patient to have some relief of pain and to communicate with the family as to know what they wanted now that there has been a status change and how to direct his care at this time. MD ordered haldol Q 2 hours because he did a bedside assessment and felt this was delirium. I will continue to assess. Will clear confusion of if morphine gtt was for pain management or comfort care at this point.   Ericka Pontiff, RN 2:14 AM 11/16/16

## 2016-11-16 NOTE — Progress Notes (Signed)
Present with family for emotional and spiritual support. [name] was for the most part resting so I only spoke with him briefly. I had spoken with his wife [name] last night about her concerns for him and we offered prayer this morning for his peace. His sister [name] also shared about his illness and their concerns for him. Will continue to offer support.

## 2016-11-16 NOTE — Progress Notes (Signed)
CTSP re:  Screaming and agitation. Patient is confused and agitated.  He was directable. Vital signs are stable.  Will give IV Haldol as needed for agitation.  Orvan Falconer MD FACP.  Hospitalist.

## 2016-11-16 NOTE — Progress Notes (Addendum)
Pt has been in and out of periods of confusion being alert and oriented x1 to x3. Pt is aware of where he is (the hospital) as well as in [place]. He has screamed in pain and is clearly suffering with periods of relief but these moments of relief do not last long.The primary objective is to keep him from suffering per MD notes and family wishes through Morphine push and possible transition into morphine gtt. Night on-call doctor has d/c morphine orders and has ordered dilaudid every 3 hours PRN. The dilaudid gave some relief for about an hour, but pt was back to screaming in severe pain in his head. MD notified again and asked for bedside assessment,received the haldol order for MD assessment of increased confusion/agitation but this seems to have little to no effect. Pt is still screaming in severe pain, MD called to check on pt and he is aware of this and can hear the pt through the phone, no new orders at this time. RN to give dilaudid when due again in hopes pt will gain some relief from pain.Will continue to montior and assess.  Ericka Pontiff, RN 3:07 AM 11/16/16

## 2016-11-16 NOTE — Progress Notes (Signed)
Daily Progress Note   Patient Name: Jeffrey Trujillo       Date: 11/16/2016 DOB: 1951/03/04  Age: 66 y.o. MRN#: [id number] Attending Physician: Albertine Patricia, MD Primary Care Physician: [place] Date: 11/15/2016  Reason for Consultation/Follow-up: Establishing goals of care and Psychosocial/spiritual support  Subjective: Jeffrey Trujillo is lying quietly in bed. He is able to open his eyes and make eye contact when I call his name. He occasionally it is moaning, crying out in pain. Tries to communicate but is having difficulty making his needs known. Present at bedside today is sister [name] and her husband and their son. [name] states that [name] wife, [name], has gone home but will return for a family meeting at 11:30 today. [name] states that she has seen her brother's decline, and her goal is comfort and dignity. Conference with nursing staff regarding pain management, conference with hospitalist Dr. Waldron Labs.   Length of Stay: 1  Current Medications: Scheduled Meds:  . ampicillin (OMNIPEN) IV  2 g Intravenous Q4H  . cefTRIAXone (ROCEPHIN)  IV  2 g Intravenous Q12H  . enoxaparin (LOVENOX) injection  40 mg Subcutaneous Q24H  . sodium chloride flush  3 mL Intravenous Q12H  . vancomycin  1,500 mg Intravenous Once  . vancomycin  1,000 mg Intravenous Q12H    Continuous Infusions: . lactated ringers 100 mL/hr at 11/16/16 0802    PRN Meds: acetaminophen **OR** acetaminophen, haloperidol lactate, HYDROmorphone (DILAUDID) injection  Physical Exam  Constitutional:  Lying in bed, calls out or moans occasionally. Opens eyes to command, and tries to communicate  HENT:  Head: Normocephalic and atraumatic.  Cardiovascular: Normal rate and regular rhythm.     Pulmonary/Chest: Effort normal. No respiratory distress.  Abdominal: Soft. He exhibits no distension.  Musculoskeletal: He exhibits no edema.  Neurological:  Opens eyes to command  Skin: Skin is warm and dry.  Nursing note and vitals reviewed.           Vital Signs: BP (!) 176/92   Pulse 75   Temp 97.8 F (36.6 C) (Axillary)   Resp 19   Ht 5\' 11"  (1.803 m)   Wt 79.4 kg (175 lb)   SpO2 100%   BMI 24.41 kg/m  SpO2: SpO2: 100 % O2 Device: O2 Device: Not Delivered  O2 Flow Rate:    Intake/output summary:  Intake/Output Summary (Last 24 hours) at 11/16/16 1117 Last data filed at 11/15/16 2300  Gross per 24 hour  Intake             3550 ml  Output             2350 ml  Net             1200 ml   LBM: Last BM Date:  (PTA) Baseline Weight: Weight: 79.4 kg (175 lb) Most recent weight: Weight: 79.4 kg (175 lb)       Palliative Assessment/Data:      Patient Active Problem List   Diagnosis Date Noted  . Encephalopathy acute 11/15/2016  . Fever 12/07/2015  . Gouty arthritis 12/07/2015  . Fall   . Pyrexia   . Acute gout 11/15/2015  . Perirectal abscess 11/12/2015  . Hypokalemia 11/12/2015  . Anemia of chronic disease 11/12/2015  . Gallstones   . Sinus bradycardia 02/14/2015  . Malnutrition of moderate degree ([place]) 02/12/2015  . Elevated LFTs 02/12/2015  . Abdominal pain, right upper quadrant 02/12/2015  . Acute kidney injury ([place]) 02/12/2015  . Cholecystitis 02/11/2015  . Hiatal hernia   . Chest pain 04/15/2014  . PUD (peptic ulcer disease) 04/21/2013  . Melena 03/10/2013  . Abdominal pain 03/10/2013  . Leukopenia 03/10/2013  . Thrombocytopenia ([place]) 03/10/2013  . Abdominal abscess ([place]) 03/10/2013  . Anemia 03/10/2013  . ETOH abuse 03/10/2013  . Bradycardia 03/10/2013  . Barrett's esophagus 01/04/2013  . Renal cell cancer ([place]) 09/25/2012  . Lower GI bleed 09/25/2012  . Anemia due to blood loss 09/25/2012  . Acute gout of right foot 09/25/2012  . DJD  (degenerative joint disease) 09/25/2012  . Pancreatic mass 08/04/2012  . Acute strain of neck muscle 06/05/2011  . RECTAL BLEEDING 10/20/2009  . NAUSEA WITH VOMITING 10/20/2009  . DIARRHEA, BLOODY 10/20/2009    Palliative Care Assessment & Plan   Patient Profile: Jeffrey Trujillo is a 66 year old male with a medical history significant for renal cell cancer status post nephrectomy with spread to the remaining kidney. He is not being treated for the cancer per Dr. should died in 2023/10/14 he took a few treatments but ended up with problems from a known anal fistula abscess that had surgical repair February 2017 the required drain placement with complex wound healing. He also has history of iron deficiency anemia and stage IIICKD, Barrett's esophagus, diverticulosis of colon, GER D, peptic ulcer disease, gouty arthritis, DJD admitted on 11/15/16 for  acute encephalopathy.  Assessment: History of chronic worsening neck pain: hx of pain management clinic with injections, opiates from PCP, seen in ED for neck pain 10/29/16.  MRI of brain when patient is able to cooperate.  Acute encephalopathy: support for pan management, fluid, and antibiotics. MRI of brain when patient is able to cooperate.   Recommendations/Plan:  continue to treat the treatable with the hopes of MRI of the brain/neck for determination if advancement of degenerative joint disease of neck versus metastatic cancer burden.   Goals of Care and Additional Recommendations:  Limitations on Scope of Treatment: Treat the treatable but now CPR or intubation  Code Status:    Code Status Orders        Start     Ordered   11/15/16 1956  Do not attempt resuscitation (DNR)  Continuous    Question Answer Comment  In the event of cardiac or respiratory ARREST Do not call  a "code blue"   In the event of cardiac or respiratory ARREST Do not perform Intubation, CPR, defibrillation or ACLS   In the event of cardiac or respiratory ARREST Use  medication by any route, position, wound care, and other measures to relive pain and suffering. May use oxygen, suction and manual treatment of airway obstruction as needed for comfort.      11/15/16 1955    Code Status History    Date Active Date Inactive Code Status Order ID Comments User Context   12/07/2015 10:11 PM 12/09/2015  5:07 PM Full Code [id number]  Phillips Grout, MD Inpatient   11/12/2015  6:38 PM 11/18/2015  9:02 PM Full Code [id number]  Erline Hau, MD Inpatient   02/11/2015 11:37 PM 02/15/2015  3:56 PM Full Code [id number]  Truett Mainland, DO Inpatient   04/14/2014  7:04 PM 04/15/2014 11:15 PM Full Code [id number]  Doree Albee, MD ED   03/10/2013 11:51 AM 03/12/2013  5:41 PM Full Code [id number]  Radene Gunning, NP ED   09/25/2012  7:32 AM 09/30/2012  8:07 PM Full Code [id number]  Yehuda Budd, RN Inpatient       Prognosis:   Unable to determine, based on outcomes.  Discharge Planning:  To Be Determined  Care plan was discussed with nursing staff, case manager, social worker, and Dr. Waldron Labs.   Thank you for allowing the Palliative Medicine Team to assist in the care of this patient.   Time In: 1000 Time Out: 1035 Total Time 35 minutes Prolonged Time Billed  no       Greater than 50%  of this time was spent counseling and coordinating care related to the above assessment and plan.  Drue Novel, NP  Please contact Palliative Medicine Team phone at [phone] for questions and concerns.

## 2016-11-16 NOTE — Progress Notes (Signed)
Pharmacy Antibiotic Note  Jeffrey Trujillo is a 66 y.o. male admitted on 11/15/2016 with possible meningitis.  Pharmacy has been consulted for Vancomycin dosing.  Plan: Vancomycin 1500mg  IV now, then Vancomycin 1 GM IV every 12 hours.  Goal trough 15-20 mcg/mL. Ampicillin 2gm IV q4h Ceftriaxone 2gm IV q12h F/U cxs and clinical progress Monitor V/S, labs, and levels as indicated  Height: 5\' 11"  (180.3 cm) Weight: 175 lb (79.4 kg) IBW/kg (Calculated) : 75.3  Temp (24hrs), Avg:98.4 F (36.9 C), Min:97.8 F (36.6 C), Max:99 F (37.2 C)   Recent Labs Lab 11/09/16 1241 11/15/16 1150 11/15/16 1306 11/15/16 1840 11/15/16 2022 11/15/16 2304 11/16/16 0532  WBC 7.3 6.9  --   --   --   --  7.6  CREATININE 1.5* 1.26*  --   --   --   --  1.17  LATICACIDVEN  --   --  5.05* 0.75 1.8 1.2  --     Estimated Creatinine Clearance: 67 mL/min (by C-G formula based on SCr of 1.17 mg/dL).    Allergies  Allergen Reactions  . Amoxicillin Nausea And Vomiting  . Nsaids Other (See Comments)    Patient only has one kidney.   Marland Kitchen Zofran [Ondansetron Hcl] Hives  . Demerol [Meperidine] Rash    Antimicrobials this admission: Vancomycin 3/1 >> 3/1 restarted 3/2>> Zosyn 3/1 >> 3/1 Ampicillin 3/2>> Ceftriaxone 3/2>.  Dose adjustments this admission: N/a            Microbiology results: 3/1 BCx: pending 3/1 UCx: pending 3/1 MRSA PCR is negative  Thank you for allowing pharmacy to be a part of this patient's care.  Isac Sarna, BS Pharm D, [place] Clinical Pharmacist Pager [phone] 11/16/2016 10:06 AM

## 2016-11-16 NOTE — Telephone Encounter (Signed)
Currently Admitted

## 2016-11-17 ENCOUNTER — Inpatient Hospital Stay (HOSPITAL_COMMUNITY): Payer: Medicare Other

## 2016-11-17 LAB — URINE CULTURE: Culture: <10000 — AB

## 2016-11-17 LAB — CBC
HEMATOCRIT: 31.9 % — AB (ref 39.0–52.0)
HEMOGLOBIN: 10.4 g/dL — AB (ref 13.0–17.0)
MCH: 29.9 pg (ref 26.0–34.0)
MCHC: 32.6 g/dL (ref 30.0–36.0)
MCV: 91.7 fL (ref 78.0–100.0)
Platelets: 164 10*3/uL (ref 150–400)
RBC: 3.48 MIL/uL — AB (ref 4.22–5.81)
RDW: 16.3 % — ABNORMAL HIGH (ref 11.5–15.5)
WBC: 7.7 10*3/uL (ref 4.0–10.5)

## 2016-11-17 LAB — BASIC METABOLIC PANEL
ANION GAP: 10 (ref 5–15)
BUN: 21 mg/dL — ABNORMAL HIGH (ref 6–20)
CO2: 26 mmol/L (ref 22–32)
Calcium: 9.1 mg/dL (ref 8.9–10.3)
Chloride: 101 mmol/L (ref 101–111)
Creatinine, Ser: 1.14 mg/dL (ref 0.61–1.24)
GFR calc non Af Amer: >60 mL/min (ref >60)
Glucose, Bld: 150 mg/dL — ABNORMAL HIGH (ref 65–99)
POTASSIUM: 4 mmol/L (ref 3.5–5.1)
Sodium: 137 mmol/L (ref 135–145)

## 2016-11-17 LAB — GLUCOSE, CSF: Glucose, CSF: 105 mg/dL — ABNORMAL HIGH (ref 40–70)

## 2016-11-17 LAB — CSF CELL COUNT WITH DIFFERENTIAL
RBC Count, CSF: 31 /mm3 — ABNORMAL HIGH
TUBE #: 1
WBC, CSF: 0 /mm3 (ref 0–5)

## 2016-11-17 LAB — PROTEIN, CSF: TOTAL PROTEIN, CSF: 34 mg/dL (ref 15–45)

## 2016-11-17 MED ORDER — SODIUM CHLORIDE 0.9 % IV SOLN: INTRAVENOUS | Status: DC

## 2016-11-17 MED ORDER — SACCHAROMYCES BOULARDII 250 MG PO CAPS
250.0000 mg | ORAL_CAPSULE | Freq: Two times a day (BID) | ORAL | Status: DC
  Administered 2016-11-17 – 2016-11-20 (×6): 250 mg via ORAL
  Filled 2016-11-17 (×7): qty 1

## 2016-11-17 MED ORDER — LIDOCAINE HCL (PF) 1 % IJ SOLN
10.0000 mL | Freq: Once | INTRAMUSCULAR | Status: AC
  Administered 2016-11-17: 10 mL via SUBCUTANEOUS

## 2016-11-17 MED ORDER — MORPHINE SULFATE (PF) 2 MG/ML IV SOLN
2.0000 mg | INTRAVENOUS | Status: DC | PRN
  Administered 2016-11-17 – 2016-11-18 (×5): 2 mg via INTRAVENOUS
  Filled 2016-11-17 (×4): qty 1

## 2016-11-17 MED ORDER — PANTOPRAZOLE SODIUM 40 MG PO TBEC
40.0000 mg | DELAYED_RELEASE_TABLET | Freq: Two times a day (BID) | ORAL | Status: DC
  Administered 2016-11-17 – 2016-11-20 (×7): 40 mg via ORAL
  Filled 2016-11-17 (×7): qty 1

## 2016-11-17 NOTE — Progress Notes (Signed)
Patient picked up by [facility] to travel to [facility] for Lumbar Puncture. Patient returning after procedure.

## 2016-11-17 NOTE — Progress Notes (Signed)
PROGRESS NOTE                                                                                                                                                                                                             Patient Demographics:    Jeffrey Trujillo, is a 66 y.o. male, DOB - 15-Jul-1951, [id number]  Admit date - 11/15/2016   Admitting Physician Karmen Bongo, MD  Outpatient Primary MD for the patient is [facility]  LOS - 2  Outpatient Specialists: [facility] Dr Alen Blew.  Chief Complaint  Patient presents with  . Altered Mental Status       Brief Narrative   66 y.o. male with medical history significant of renal cell CA s/p nephrectomy with spread to the remaining kidney for which he is not being treated; iron deficiency anemia; stage 3 CKD; and an anal fistula presenting with AMS and severe pain .   Subjective:    Jeffrey Trujillo today Awake alert oriented, appropriate, reports he is feeling much better, still complaining of mild headache and cervical pain, asking if he can eat.   Assessment  & Plan :    Principal Problem:   Encephalopathy acute Active Problems:   Renal cell cancer ([place])   Perirectal abscess   Anemia of chronic disease   Chronic neck pain   Goals of care, counseling/discussion   Palliative care encounter  Acute encephalopathy - Patient with acute worsening mental status over last 48 hours, CT head with no acute findings but poor quality, MRI brain With no acute finding as well, multi cervical spine significant for cervical disc degeneration, most notable at C6-7 , New right facet arthrosis at C2-3 without stenosis and New/larger, shallow C7-T1 disc protrusion without stenosis. - Unclear etiology, thought initially secondary to the brain or brainstem metastasis, or cervical spine metastasis, but currently no evidence of metastasis on MRI. - Infectious challenges possibility, has been  started empirically on ampicillin, vancomycin and Rocephin to cover for bacterial meningitis, but he is afebrile, with no leukocytosis, so will need LP for further evaluation and to determine if it's appropriate to continue with this empiric antibiotic coverage. - Appears to be improving on IV Decadron 10 mg every 6 hours, will continue currently and taper in the next 24 hours.  Cancer related pain - Patient with significant  headache, generalized pain, cervicothoracic pain, Dilaudid has been causing bradycardia overnight, so currently has been transitioned to morphine .  Renal cell cancer - Patient followed by Dr.Shadad, chemotherapy is currently on hold given rectal abscess.  Anemia of chronic disease - Continue to monitor closely  Code Status : DO NOT RESUSCITATE  Family Communication  : Discussed with wife at bedside  Disposition Plan  : pending further work up.  Consults  :  Palliative  Procedures  : None  DVT Prophylaxis  :  Lovenox   Lab Results  Component Value Date   PLT 164 11/17/2016    Antibiotics  :    Anti-infectives    Start     Dose/Rate Route Frequency Ordered Stop   11/16/16 2200  vancomycin (VANCOCIN) IVPB 1000 mg/200 mL premix     1,000 mg 200 mL/hr over 60 Minutes Intravenous Every 12 hours 11/16/16 1015     11/16/16 1000  cefTRIAXone (ROCEPHIN) 2 g in dextrose 5 % 50 mL IVPB     2 g 100 mL/hr over 30 Minutes Intravenous Every 12 hours 11/16/16 0847     11/16/16 0900  ampicillin (OMNIPEN) 2 g in sodium chloride 0.9 % 50 mL IVPB     2 g 150 mL/hr over 20 Minutes Intravenous Every 4 hours 11/16/16 0847     11/16/16 0900  vancomycin (VANCOCIN) 1,500 mg in sodium chloride 0.9 % 500 mL IVPB     1,500 mg 250 mL/hr over 120 Minutes Intravenous  Once 11/16/16 0847 11/16/16 1231   11/16/16 0200  vancomycin (VANCOCIN) IVPB 1000 mg/200 mL premix  Status:  Discontinued     1,000 mg 200 mL/hr over 60 Minutes Intravenous Every 12 hours 11/15/16 1339 11/15/16 1955     11/15/16 2200  piperacillin-tazobactam (ZOSYN) IVPB 3.375 g  Status:  Discontinued     3.375 g 12.5 mL/hr over 240 Minutes Intravenous Every 8 hours 11/15/16 1338 11/15/16 1955   11/15/16 1345  vancomycin (VANCOCIN) 1,500 mg in sodium chloride 0.9 % 500 mL IVPB     1,500 mg 250 mL/hr over 120 Minutes Intravenous  Once 11/15/16 1330 11/15/16 1646   11/15/16 1330  piperacillin-tazobactam (ZOSYN) IVPB 3.375 g     3.375 g 100 mL/hr over 30 Minutes Intravenous  Once 11/15/16 1322 11/15/16 1413   11/15/16 1330  vancomycin (VANCOCIN) IVPB 1000 mg/200 mL premix  Status:  Discontinued     1,000 mg 200 mL/hr over 60 Minutes Intravenous  Once 11/15/16 1322 11/15/16 1330        Objective:   Vitals:   11/17/16 0900 11/17/16 1000 11/17/16 1130 11/17/16 1200  BP: (!) 157/65 (!) 147/75  (!) 161/77  Pulse: (!) 48 67    Resp: 10 (!) 27 18 10   Temp:   97.9 F (36.6 C)   TempSrc:   Oral   SpO2: 100% 98%  99%  Weight:      Height:        Wt Readings from Last 3 Encounters:  11/15/16 79.4 kg (175 lb)  11/09/16 79.7 kg (175 lb 12.8 oz)  10/29/16 82.6 kg (182 lb)     Intake/Output Summary (Last 24 hours) at 11/17/16 1341 Last data filed at 11/17/16 1300  Gross per 24 hour  Intake             3880 ml  Output             2900 ml  Net  980 ml     Physical Exam  Awake alert oriented 3, laying in bed in no apparent distress Symmetrical Chest wall movement, Good air movement bilaterally, No wheezing Regular rate and rhythm,No Gallops,Rubs or new Murmurs, No Parasternal Heave +ve B.Sounds, Abd Soft, No tenderness,  No rebound - guarding or rigidity. No Cyanosis, Clubbing or edema, patient with tenderness to palpation in the low cervical spine area     Data Review:    CBC  Recent Labs Lab 11/15/16 1150 11/16/16 0532 11/17/16 0549  WBC 6.9 7.6 7.7  HGB 10.7* 9.8* 10.4*  HCT 33.0* 30.6* 31.9*  PLT 165 130* 164  MCV 93.2 92.2 91.7  MCH 30.2 29.5 29.9  MCHC 32.4  32.0 32.6  RDW 17.2* 16.9* 16.3*    Chemistries   Recent Labs Lab 11/15/16 1150 11/16/16 0532 11/17/16 0549  NA 137 139 137  K 3.6 3.7 4.0  CL 103 107 101  CO2 27 25 26   GLUCOSE 106* 94 150*  BUN 18 15 21*  CREATININE 1.26* 1.17 1.14  CALCIUM 9.1 8.7* 9.1  AST 16  --   --   ALT 16*  --   --   ALKPHOS 56  --   --   BILITOT 1.0  --   --    ------------------------------------------------------------------------------------------------------------------ No results for input(s): CHOL, HDL, LDLCALC, TRIG, CHOLHDL, LDLDIRECT in the last 72 hours.  No results found for: HGBA1C ------------------------------------------------------------------------------------------------------------------ No results for input(s): TSH, T4TOTAL, T3FREE, THYROIDAB in the last 72 hours.  Invalid input(s): FREET3 ------------------------------------------------------------------------------------------------------------------ No results for input(s): VITAMINB12, FOLATE, FERRITIN, TIBC, IRON, RETICCTPCT in the last 72 hours.  Coagulation profile  Recent Labs Lab 11/15/16 1150  INR 1.08    No results for input(s): DDIMER in the last 72 hours.  Cardiac Enzymes No results for input(s): CKMB, TROPONINI, MYOGLOBIN in the last 168 hours.  Invalid input(s): CK ------------------------------------------------------------------------------------------------------------------    Component Value Date/Time   BNP 57.0 01/03/2015 1043    Inpatient Medications  Scheduled Meds: . ampicillin (OMNIPEN) IV  2 g Intravenous Q4H  . cefTRIAXone (ROCEPHIN)  IV  2 g Intravenous Q12H  . dexamethasone  10 mg Intravenous Q6H  . pantoprazole  40 mg Oral BID  . saccharomyces boulardii  250 mg Oral BID  . sodium chloride flush  3 mL Intravenous Q12H  . vancomycin  1,000 mg Intravenous Q12H   Continuous Infusions: . lactated ringers 100 mL/hr at 11/16/16 2228   PRN Meds:.acetaminophen **OR** acetaminophen,  haloperidol lactate, morphine injection  Micro Results Recent Results (from the past 240 hour(s))  Blood Culture (routine x 2)     Status: None (Preliminary result)   Collection Time: 11/15/16  1:33 PM  Result Value Ref Range Status   Specimen Description RIGHT ANTECUBITAL  Final   Special Requests BOTTLES DRAWN AEROBIC AND ANAEROBIC 10CC EACH  Final   Culture NO GROWTH 2 DAYS  Final   Report Status PENDING  Incomplete  Blood Culture (routine x 2)     Status: None (Preliminary result)   Collection Time: 11/15/16  1:37 PM  Result Value Ref Range Status   Specimen Description LEFT ANTECUBITAL  Final   Special Requests BOTTLES DRAWN AEROBIC ONLY 10 CC AEB  Final   Culture NO GROWTH 2 DAYS  Final   Report Status PENDING  Incomplete  Urine culture     Status: Abnormal   Collection Time: 11/15/16  1:50 PM  Result Value Ref Range Status   Specimen Description URINE, CATHETERIZED  Final   Special Requests NONE  Final   Culture (A)  Final    <10,000 COLONIES/mL INSIGNIFICANT GROWTH Performed at [facility], [place] [address], [place], [place] [address]    Report Status 11/17/2016 FINAL  Final  MRSA PCR Screening     Status: None   Collection Time: 11/15/16  8:02 PM  Result Value Ref Range Status   MRSA by PCR NEGATIVE NEGATIVE Final    Comment:        The GeneXpert MRSA Assay (FDA approved for NASAL specimens only), is one component of a comprehensive MRSA colonization surveillance program. It is not intended to diagnose MRSA infection nor to guide or monitor treatment for MRSA infections.     Radiology Reports Ct Abdomen Pelvis Wo Contrast  Result Date: 11/15/2016 CLINICAL DATA:  Urinary urgency and incontinence. EXAM: CT ABDOMEN AND PELVIS WITHOUT CONTRAST TECHNIQUE: Multidetector CT imaging of the abdomen and pelvis was performed following the standard protocol without IV contrast. COMPARISON:  CT 08/31/2016. FINDINGS: Lower chest: Breathing motion artifact but no obvious  pulmonary lesion or acute pulmonary abnormality. The heart is normal in size. No pericardial effusion. Hepatobiliary: No focal hepatic lesions or intrahepatic biliary dilatation. The gallbladder is grossly normal. Pancreas: Stable lesion in the pancreatic body measuring 20 x 18 mm on image number 28. No ductal dilatation. Spleen: Normal size.  No focal lesions. Adrenals/Urinary Tract: Solid left adrenal gland lesion measures 2 x 2 cm and previously measured 1.8 x 1.8 cm. The right adrenal gland is normal. Stable exophytic slightly complex left renal cyst. There is an enlarging solid upper pole Mass obliterating the upper pole collecting system. Expanded and hyperdense left pulmonary vein consistent with tumor invasion. No hydronephrosis. No ureteral or bladder calculi. The right kidney is surgically absent. Stomach/Bowel: The stomach, duodenum, small bowel and colon are grossly normal. No acute inflammatory changes, mass lesions or obstructive findings. Colonic diverticulosis is noted. Vascular/Lymphatic: The aorta and branch vessels are grossly normal without contrast. No aneurysm. No mesenteric or retroperitoneal mass or adenopathy. Reproductive: Surgical changes from TURP. Seminal vesicles are grossly normal. Other: No pelvic mass or adenopathy. No free pelvic fluid collections. No inguinal mass or adenopathy. No abdominal wall hernia or subcutaneous lesions. Musculoskeletal: No significant bony findings. No lytic/destructive bone lesions are identified. IMPRESSION: 1. Enlarging left upper pole solid renal mass measuring approximately 5.6 cm with evidence of tumor invading the proximal renal vein which is hyperdense and distended. 2. Slight interval enlargement of a left adrenal gland lesion. 3. Slight interval enlargement of the pancreatic body lesion. 4. No findings for metastatic disease involving the liver, lung bases or bony structures. 5. No renal, ureteral or bladder calculi. Electronically Signed   By: Marijo Sanes M.D.   On: 11/15/2016 17:03   Ct Head Wo Contrast  Result Date: 11/15/2016 CLINICAL DATA:  Altered mental status EXAM: CT HEAD WITHOUT CONTRAST TECHNIQUE: Contiguous axial images were obtained from the base of the skull through the vertex without intravenous contrast. COMPARISON:  None. FINDINGS: Brain: Image quality degraded by moderate motion. Repeat attempts were attempted but there was excessive motion on the repeat images. Mild atrophy. Negative for hydrocephalus. Negative for hemorrhage or mass. No acute infarct identified. Vascular: No hyperdense vessel or unexpected calcification. Skull: Negative Sinuses/Orbits: Negative Other: None IMPRESSION: No acute abnormality.  Image quality degraded by moderate motion. Electronically Signed   By: Franchot Gallo M.D.   On: 11/15/2016 12:26   Mr Brain Wo Contrast  Result Date: 11/17/2016 CLINICAL DATA:  Headache and neck pain. EXAM: MRI HEAD WITHOUT CONTRAST TECHNIQUE: Multiplanar, multiecho pulse sequences of the brain and surrounding structures were obtained without intravenous contrast. COMPARISON:  Head CT 11/15/2016 FINDINGS: Brain: There is no evidence of acute infarct, intracranial hemorrhage, mass, midline shift, or extra-axial fluid collection. There is mild cerebral atrophy. Periventricular and scattered subcortical cerebral white matter T2 hyperintensities are nonspecific but compatible with mild chronic small vessel ischemic disease. Vascular: Major intracranial vascular flow voids are preserved. Skull and upper cervical spine: Unremarkable bone marrow signal. Sinuses/Orbits: Globes appear intact. Remote fracture involving the right maxillary sinus and inferior orbital rim. Minimal left greater than right ethmoid sinus mucosal thickening. Trace right mastoid fluid. Other: None. IMPRESSION: 1. No acute intracranial abnormality. 2. Mild chronic small vessel ischemic disease and cerebral atrophy. Electronically Signed   By: Logan Bores M.D.    On: 11/17/2016 11:42   Mr Cervical Spine Wo Contrast  Result Date: 11/17/2016 CLINICAL DATA:  Headache and neck pain. EXAM: MRI CERVICAL SPINE WITHOUT CONTRAST TECHNIQUE: Multiplanar, multisequence MR imaging of the cervical spine was performed. No intravenous contrast was administered. COMPARISON:  06/05/2011 FINDINGS: Despite the patient being pre-medicated, only 3 sequences could be obtained (sagittal T1, sagittal STIR, and axial gradient echo T2). These are mildly to moderately motion degraded. Alignment: Reversal the normal cervical lordosis. Trace anterolisthesis of C3 on C4. Vertebrae: No evidence of fracture, suspicious osseous lesion, or significant marrow edema. Cord: Normal morphology. No definite signal abnormality identified within limitations of motion artifact. Posterior Fossa, vertebral arteries, paraspinal tissues: Unremarkable. Disc levels: C2-3: New moderate right facet arthrosis without significant stenosis. C3-4: Listhesis with mild disc uncovering and mild to moderate facet arthrosis without significant stenosis. C4-5: Moderate disc space narrowing. Right greater left uncovertebral spurring results in mild right neural foraminal stenosis, similar to prior. No spinal stenosis. C5-6: Uncovertebral spurring results in mild bilateral neural foraminal narrowing, likely unchanged. Diffuse osteophytic ridging posteriorly (versus disc bulging on the prior) without significant spinal stenosis. C6-7: Disc bulging and uncovertebral spurring result in mild spinal stenosis and mild-to-moderate left neural foraminal stenosis, not significantly changed. C7-T1: Shallow central disc protrusion, new or larger than on the prior study though without stenosis or spinal cord mass effect. IMPRESSION: 1. Incomplete, motion degraded examination. 2. Multilevel cervical disc degeneration, most notable at C6-7 where there is unchanged mild spinal stenosis and mild-to-moderate left foraminal stenosis. 3. New right facet  arthrosis at C2-3 without stenosis. 4. New/larger, shallow C7-T1 disc protrusion without stenosis. Electronically Signed   By: Logan Bores M.D.   On: 11/17/2016 11:55   Dg Chest Port 1 View  Result Date: 11/15/2016 CLINICAL DATA:  Urinary tract infection with altered mental status. EXAM: PORTABLE CHEST 1 VIEW COMPARISON:  08/31/2016 chest CT, CXR 12/07/2015 FINDINGS: The heart size and mediastinal contours are within normal limits. Chronic blunting of the left costophrenic angle with postoperative deformity of the left posterior sixth rib in this patient who is reportedly status post partial pneumonectomy. Mild central vascular congestion. No pneumothorax or pneumonic consolidation. No acute nor worrisome osseous abnormalities. IMPRESSION: Postsurgical change of the left hemithorax. Central vascular congestion suggestive of mild CHF. Electronically Signed   By: Ashley Royalty M.D.   On: 11/15/2016 13:46    Time Spent in minutes  35 minutes   Koleen Celia M.D on 11/17/2016 at 1:41 PM  Between 7am to 7pm - Pager - [phone]  After 7pm go to [email or web address] - password [email or web address]  [facility] -  Office  [phone]

## 2016-11-17 NOTE — Progress Notes (Signed)
Patient arrived via [facility] from [facility] after Lumbar Puncture. Patient tolerated trip well, Report taken from [facility] and [place] from [place].

## 2016-11-17 NOTE — Progress Notes (Signed)
Patient taken to MRI for imaging. RN accompanied patient to monitor during MRI. Patient unable to complete MRI due to back spasm. Md advised.

## 2016-11-17 NOTE — Progress Notes (Signed)
Spoke with MD regarding pain control, advised pt was SBrady and heart rate dropped during pm shift after dilaudid. Pt stating his back is hurting, MD discontinued Dilaudid and ordered Morphine 2mg  Q3 as needed. Md advised to give dose now, to see how patient handled, prior to transport for MRI today

## 2016-11-17 NOTE — Progress Notes (Signed)
Called [facility] radiology-[place] to advise [facility] was at [facility] to transport patient to [facility]. Report given to [place] from [place] and Kirvin @ [facility] who is travelling with patient to monitor during procedure at [facility].

## 2016-11-17 NOTE — Procedures (Signed)
Successful lumbar puncture. 10 cc clear CSF removed and sent to the lab.  Patient tolerated procedure well with no apparent complications.

## 2016-11-18 LAB — BASIC METABOLIC PANEL
Anion gap: 8 (ref 5–15)
BUN: 26 mg/dL — ABNORMAL HIGH (ref 6–20)
CHLORIDE: 102 mmol/L (ref 101–111)
CO2: 30 mmol/L (ref 22–32)
Calcium: 9.1 mg/dL (ref 8.9–10.3)
Creatinine, Ser: 1.2 mg/dL (ref 0.61–1.24)
GFR calc Af Amer: >60 mL/min (ref >60)
GFR calc non Af Amer: >60 mL/min (ref >60)
Glucose, Bld: 155 mg/dL — ABNORMAL HIGH (ref 65–99)
POTASSIUM: 3.6 mmol/L (ref 3.5–5.1)
Sodium: 140 mmol/L (ref 135–145)

## 2016-11-18 LAB — HERPES SIMPLEX VIRUS(HSV) DNA BY PCR
HSV 1 DNA: NEGATIVE
HSV 2 DNA: NEGATIVE

## 2016-11-18 LAB — CBC
HEMATOCRIT: 30.6 % — AB (ref 39.0–52.0)
HEMOGLOBIN: 10.1 g/dL — AB (ref 13.0–17.0)
MCH: 30.2 pg (ref 26.0–34.0)
MCHC: 33 g/dL (ref 30.0–36.0)
MCV: 91.6 fL (ref 78.0–100.0)
Platelets: 158 10*3/uL (ref 150–400)
RBC: 3.34 MIL/uL — AB (ref 4.22–5.81)
RDW: 16.2 % — ABNORMAL HIGH (ref 11.5–15.5)
WBC: 12.2 10*3/uL — ABNORMAL HIGH (ref 4.0–10.5)

## 2016-11-18 MED ORDER — DEXAMETHASONE 4 MG PO TABS
4.0000 mg | ORAL_TABLET | Freq: Four times a day (QID) | ORAL | Status: DC
  Administered 2016-11-18 – 2016-11-19 (×4): 4 mg via ORAL
  Filled 2016-11-18 (×13): qty 1

## 2016-11-18 MED ORDER — HYDRALAZINE HCL 20 MG/ML IJ SOLN
10.0000 mg | Freq: Once | INTRAMUSCULAR | Status: AC
  Administered 2016-11-18: 10 mg via INTRAVENOUS
  Filled 2016-11-18: qty 1

## 2016-11-18 MED ORDER — HYDRALAZINE HCL 20 MG/ML IJ SOLN: 5.0000 mg | Freq: Four times a day (QID) | INTRAMUSCULAR | Status: DC | PRN

## 2016-11-18 MED ORDER — OXYCODONE-ACETAMINOPHEN 5-325 MG PO TABS
1.0000 | ORAL_TABLET | ORAL | Status: DC | PRN
  Filled 2016-11-18: qty 1

## 2016-11-18 MED ORDER — TOPIRAMATE 25 MG PO TABS
25.0000 mg | ORAL_TABLET | Freq: Two times a day (BID) | ORAL | Status: DC
  Administered 2016-11-18 – 2016-11-20 (×4): 25 mg via ORAL
  Filled 2016-11-18 (×7): qty 1

## 2016-11-18 MED ORDER — OXYCODONE-ACETAMINOPHEN 5-325 MG PO TABS
1.0000 | ORAL_TABLET | ORAL | Status: DC | PRN
  Administered 2016-11-18: 1 via ORAL
  Administered 2016-11-18 – 2016-11-20 (×4): 2 via ORAL
  Filled 2016-11-18: qty 2
  Filled 2016-11-18: qty 1
  Filled 2016-11-18 (×2): qty 2
  Filled 2016-11-18: qty 1

## 2016-11-18 MED ORDER — DOCUSATE SODIUM 100 MG PO CAPS
100.0000 mg | ORAL_CAPSULE | Freq: Two times a day (BID) | ORAL | Status: DC
  Administered 2016-11-19: 100 mg via ORAL
  Filled 2016-11-18 (×3): qty 1

## 2016-11-18 MED ORDER — COLCHICINE 0.6 MG PO TABS
0.6000 mg | ORAL_TABLET | Freq: Two times a day (BID) | ORAL | Status: AC
  Administered 2016-11-18 – 2016-11-20 (×4): 0.6 mg via ORAL
  Filled 2016-11-18 (×4): qty 1

## 2016-11-18 MED ORDER — AMLODIPINE BESYLATE 5 MG PO TABS
5.0000 mg | ORAL_TABLET | Freq: Every day | ORAL | Status: DC
  Administered 2016-11-18 – 2016-11-19 (×2): 5 mg via ORAL
  Filled 2016-11-18 (×2): qty 1

## 2016-11-18 NOTE — Progress Notes (Signed)
Pt maintaining systolic BP [id number]. Dr Marin Comment notified. V.O. Hydralazine given

## 2016-11-18 NOTE — Progress Notes (Addendum)
PROGRESS NOTE                                                                                                                                                                                                             Patient Demographics:    Victoriano Toole, is a 66 y.o. male, DOB - 06-15-1951, [id number]  Admit date - 11/15/2016   Admitting Physician Karmen Bongo, MD  Outpatient Primary MD for the patient is [facility]  LOS - 3  Outpatient Specialists: [facility] Dr Alen Blew.  Chief Complaint  Patient presents with  . Altered Mental Status       Brief Narrative   66 y.o. male with medical history significant of renal cell CA s/p nephrectomy with spread to the remaining kidney for which he is not being treated; iron deficiency anemia; stage 3 CKD; and an anal fistula presenting with AMS and severe Neck pain with headache  .   Subjective:    Jhadiel Eggett today Awake alert oriented, appropriate, reports he is feeling much better, Headache significantly subsided, still reports mild cervical pain, aching in the room with no deficits .   Assessment  & Plan :    Principal Problem:   Encephalopathy acute Active Problems:   Renal cell cancer ([place])   Perirectal abscess   Anemia of chronic disease   Chronic neck pain   Goals of care, counseling/discussion   Palliative care encounter  Acute encephalopathy - Patient with acute worsening mental status over last 48 hours, CT head with no acute findings but poor quality, MRI brain With no acute finding as well, multi cervical spine significant for cervical disc degeneration, most notable at C6-7 , New right facet arthrosis at C2-3 without stenosis and New/larger, shallow C7-T1 disc protrusion without stenosis. - Unclear etiology, thought initially secondary to the brain or brainstem metastasis, or cervical spine metastasis, but currently no evidence of metastasis on MRI. -  Infectious Versus is less likely, but given his critical condition he was started empirically on ampicillin, vancomycin and Rocephin to cover for bacterial meningitis, but he is afebrile, with no leukocytosis, L L PICC be done by radiology on 3/3 at [facility], there are no white blood cells to be seen, has elevated glucose level, which goes against infectious process, as well Gram stain is negative ,  discussed with ID via phone, especially in the setting of no fever, leukocytosis, totally negative LP recommendation is to stop antibiotics and monitor closely . 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  Cancer related pain - Patient with significant headache, generalized pain, cervicothoracic pain, Dilaudid has been causing bradycardia overnight, transitioned to morphine, pain appears to be improving, will change to Percocet  Renal cell cancer - Patient followed by Dr.Shadad, chemotherapy is currently on hold given rectal abscess.  Anemia of chronic disease - Continue to monitor closely  Hypertension - Blood pressure uncontrolled, will start on amlodipine and when necessary hydralazine  Code Status : DO NOT RESUSCITATE  Family Communication  : None at bedside  Disposition Plan  : Transfer to telemetry floor, PT consulted  Consults  :  Palliative  Procedures  : None  DVT Prophylaxis  :  Lovenox   Lab Results  Component Value Date   PLT 158 11/18/2016    Antibiotics  :    Anti-infectives    Start     Dose/Rate Route Frequency Ordered Stop   11/16/16 2200  vancomycin (VANCOCIN) IVPB 1000 mg/200 mL premix  Status:  Discontinued     1,000 mg 200 mL/hr over 60 Minutes Intravenous Every 12 hours 11/16/16 1015 11/18/16 1225   11/16/16 1000  cefTRIAXone (ROCEPHIN)  2 g in dextrose 5 % 50 mL IVPB  Status:  Discontinued     2 g 100 mL/hr over 30 Minutes Intravenous Every 12 hours 11/16/16 0847 11/18/16 1225   11/16/16 0900  ampicillin (OMNIPEN) 2 g in sodium chloride 0.9 % 50 mL IVPB  Status:  Discontinued     2 g 150 mL/hr over 20 Minutes Intravenous Every 4 hours 11/16/16 0847 11/18/16 1225   11/16/16 0900  vancomycin (VANCOCIN) 1,500 mg in sodium chloride 0.9 % 500 mL IVPB     1,500 mg 250 mL/hr over 120 Minutes Intravenous  Once 11/16/16 0847 11/16/16 1231   11/16/16 0200  vancomycin (VANCOCIN) IVPB 1000 mg/200 mL premix  Status:  Discontinued     1,000 mg 200 mL/hr over 60 Minutes Intravenous Every 12 hours 11/15/16 1339 11/15/16 1955   11/15/16 2200  piperacillin-tazobactam (ZOSYN) IVPB 3.375 g  Status:  Discontinued     3.375 g 12.5 mL/hr over 240 Minutes Intravenous Every 8 hours 11/15/16 1338 11/15/16 1955   11/15/16 1345  vancomycin (VANCOCIN) 1,500 mg in sodium chloride 0.9 % 500 mL IVPB     1,500 mg 250 mL/hr over 120 Minutes Intravenous  Once 11/15/16 1330 11/15/16 1646   11/15/16 1330  piperacillin-tazobactam (ZOSYN) IVPB 3.375 g     3.375 g 100 mL/hr over 30 Minutes Intravenous  Once 11/15/16 1322 11/15/16 1413   11/15/16 1330  vancomycin (VANCOCIN) IVPB 1000 mg/200 mL premix  Status:  Discontinued     1,000 mg 200 mL/hr over 60 Minutes Intravenous  Once 11/15/16 1322 11/15/16 1330        Objective:   Vitals:   11/17/16 1730 11/17/16 1800 11/18/16 0528 11/18/16 0909  BP: (!) 157/78 (!) 142/78 (!) 154/76 (!) 167/79  Pulse: 71 64    Resp: 15 15    Temp:      TempSrc:      SpO2: 100% 100%    Weight:  80.1 kg (176 lb 9.4 oz)   Height:        Wt Readings from Last 3 Encounters:  11/18/16 80.1 kg (176 lb 9.4 oz)  11/09/16 79.7 kg (175 lb 12.8 oz)  10/29/16 82.6 kg (182 lb)     Intake/Output Summary (Last 24 hours) at 11/18/16 1259 Last data filed at 11/18/16 0600  Gross per 24 hour  Intake             2300 ml  Output              1340 ml  Net              960 ml     Physical Exam  Awake alert oriented 3, laying in bed in no apparent distress Symmetrical Chest wall movement, Good air movement bilaterally, No wheezing Regular rate and rhythm,No Gallops,Rubs or new Murmurs, No Parasternal Heave +ve B.Sounds, Abd Soft, No tenderness,  No rebound - guarding or rigidity. No Cyanosis, Clubbing or edema, patient with focal tenderness to palpation in the low cervical spine area, but otherwise negative meningeal signs     Data Review:    CBC  Recent Labs Lab 11/15/16 1150 11/16/16 0532 11/17/16 0549 11/18/16 0815  WBC 6.9 7.6 7.7 12.2*  HGB 10.7* 9.8* 10.4* 10.1*  HCT 33.0* 30.6* 31.9* 30.6*  PLT 165 130* 164 158  MCV 93.2 92.2 91.7 91.6  MCH 30.2 29.5 29.9 30.2  MCHC 32.4 32.0 32.6 33.0  RDW 17.2* 16.9* 16.3* 16.2*    Chemistries   Recent Labs Lab 11/15/16 1150 11/16/16 0532 11/17/16 0549 11/18/16 0815  NA 137 139 137 140  K 3.6 3.7 4.0 3.6  CL 103 107 101 102  CO2 27 25 26 30   GLUCOSE 106* 94 150* 155*  BUN 18 15 21* 26*  CREATININE 1.26* 1.17 1.14 1.20  CALCIUM 9.1 8.7* 9.1 9.1  AST 16  --   --   --   ALT 16*  --   --   --   ALKPHOS 56  --   --   --   BILITOT 1.0  --   --   --    ------------------------------------------------------------------------------------------------------------------ No results for input(s): CHOL, HDL, LDLCALC, TRIG, CHOLHDL, LDLDIRECT in the last 72 hours.  No results found for: HGBA1C ------------------------------------------------------------------------------------------------------------------ No results for input(s): TSH, T4TOTAL, T3FREE, THYROIDAB in the last 72 hours.  Invalid input(s): FREET3 ------------------------------------------------------------------------------------------------------------------ No results for input(s): VITAMINB12, FOLATE, FERRITIN, TIBC, IRON, RETICCTPCT in the last 72 hours.  Coagulation profile  Recent  Labs Lab 11/15/16 1150  INR 1.08    No results for input(s): DDIMER in the last 72 hours.  Cardiac Enzymes No results for input(s): CKMB, TROPONINI, MYOGLOBIN in the last 168 hours.  Invalid input(s): CK ------------------------------------------------------------------------------------------------------------------    Component Value Date/Time   BNP 57.0 01/03/2015 1043    Inpatient Medications  Scheduled Meds: . amLODipine  5 mg Oral Daily  . dexamethasone  4 mg Oral Q6H  . pantoprazole  40 mg Oral BID  . saccharomyces boulardii  250 mg Oral BID  . sodium chloride flush  3 mL Intravenous Q12H   Continuous Infusions:  PRN Meds:.acetaminophen **OR** acetaminophen, hydrALAZINE, oxyCODONE-acetaminophen  Micro Results Recent Results (from the past 240 hour(s))  Blood Culture (routine x 2)     Status: None (Preliminary result)   Collection Time: 11/15/16  1:33 PM  Result Value Ref Range Status   Specimen Description RIGHT ANTECUBITAL  Final   Special  Requests BOTTLES DRAWN AEROBIC AND ANAEROBIC 10CC EACH  Final   Culture NO GROWTH 3 DAYS  Final   Report Status PENDING  Incomplete  Blood Culture (routine x 2)     Status: None (Preliminary result)   Collection Time: 11/15/16  1:37 PM  Result Value Ref Range Status   Specimen Description LEFT ANTECUBITAL  Final   Special Requests BOTTLES DRAWN AEROBIC ONLY 10 CC AEB  Final   Culture NO GROWTH 3 DAYS  Final   Report Status PENDING  Incomplete  Urine culture     Status: Abnormal   Collection Time: 11/15/16  1:50 PM  Result Value Ref Range Status   Specimen Description URINE, CATHETERIZED  Final   Special Requests NONE  Final   Culture (A)  Final    <10,000 COLONIES/mL INSIGNIFICANT GROWTH Performed at [facility], [place] [address] [place], [place] [address]    Report Status 11/17/2016 FINAL  Final  MRSA PCR Screening     Status: None   Collection Time: 11/15/16  8:02 PM  Result Value Ref Range Status   MRSA  by PCR NEGATIVE NEGATIVE Final    Comment:        The GeneXpert MRSA Assay (FDA approved for NASAL specimens only), is one component of a comprehensive MRSA colonization surveillance program. It is not intended to diagnose MRSA infection nor to guide or monitor treatment for MRSA infections.   CSF culture     Status: None (Preliminary result)   Collection Time: 11/17/16  4:09 PM  Result Value Ref Range Status   Specimen Description CSF  Final   Special Requests NONE  Final   Gram Stain NO WBC SEEN NO ORGANISMS SEEN   Final   Culture   Final    NO GROWTH < 24 HOURS Performed at [facility], [address] [place], [place] [address]    Report Status PENDING  Incomplete  Culture, fungus without smear     Status: None (Preliminary result)   Collection Time: 11/17/16  4:09 PM  Result Value Ref Range Status   Specimen Description CSF  Final   Special Requests NONE  Final   Culture   Final    NO FUNGUS ISOLATED AFTER 1 DAY Performed at [facility], [place] [address] [place], [place] [address]    Report Status PENDING  Incomplete    Radiology Reports Ct Abdomen Pelvis Wo Contrast  Result Date: 11/15/2016 CLINICAL DATA:  Urinary urgency and incontinence. EXAM: CT ABDOMEN AND PELVIS WITHOUT CONTRAST TECHNIQUE: Multidetector CT imaging of the abdomen and pelvis was performed following the standard protocol without IV contrast. COMPARISON:  CT 08/31/2016. FINDINGS: Lower chest: Breathing motion artifact but no obvious pulmonary lesion or acute pulmonary abnormality. The heart is normal in size. No pericardial effusion. Hepatobiliary: No focal hepatic lesions or intrahepatic biliary dilatation. The gallbladder is grossly normal. Pancreas: Stable lesion in the pancreatic body measuring 20 x 18 mm on image number 28. No ductal dilatation. Spleen: Normal size.  No focal lesions. Adrenals/Urinary Tract: Solid left adrenal gland lesion measures 2 x 2 cm and previously measured 1.8 x  1.8 cm. The right adrenal gland is normal. Stable exophytic slightly complex left renal cyst. There is an enlarging solid upper pole Mass obliterating the upper pole collecting system. Expanded and hyperdense left pulmonary vein consistent with tumor invasion. No hydronephrosis. No ureteral or bladder calculi. The right kidney is surgically absent. Stomach/Bowel: The stomach, duodenum, small bowel  and colon are grossly normal. No acute inflammatory changes, mass lesions or obstructive findings. Colonic diverticulosis is noted. Vascular/Lymphatic: The aorta and branch vessels are grossly normal without contrast. No aneurysm. No mesenteric or retroperitoneal mass or adenopathy. Reproductive: Surgical changes from TURP. Seminal vesicles are grossly normal. Other: No pelvic mass or adenopathy. No free pelvic fluid collections. No inguinal mass or adenopathy. No abdominal wall hernia or subcutaneous lesions. Musculoskeletal: No significant bony findings. No lytic/destructive bone lesions are identified. IMPRESSION: 1. Enlarging left upper pole solid renal mass measuring approximately 5.6 cm with evidence of tumor invading the proximal renal vein which is hyperdense and distended. 2. Slight interval enlargement of a left adrenal gland lesion. 3. Slight interval enlargement of the pancreatic body lesion. 4. No findings for metastatic disease involving the liver, lung bases or bony structures. 5. No renal, ureteral or bladder calculi. Electronically Signed   By: Marijo Sanes M.D.   On: 11/15/2016 17:03   Ct Head Wo Contrast  Result Date: 11/15/2016 CLINICAL DATA:  Altered mental status EXAM: CT HEAD WITHOUT CONTRAST TECHNIQUE: Contiguous axial images were obtained from the base of the skull through the vertex without intravenous contrast. COMPARISON:  None. FINDINGS: Brain: Image quality degraded by moderate motion. Repeat attempts were attempted but there was excessive motion on the repeat images. Mild atrophy.  Negative for hydrocephalus. Negative for hemorrhage or mass. No acute infarct identified. Vascular: No hyperdense vessel or unexpected calcification. Skull: Negative Sinuses/Orbits: Negative Other: None IMPRESSION: No acute abnormality.  Image quality degraded by moderate motion. Electronically Signed   By: Franchot Gallo M.D.   On: 11/15/2016 12:26   Mr Brain Wo Contrast  Result Date: 11/17/2016 CLINICAL DATA:  Headache and neck pain. EXAM: MRI HEAD WITHOUT CONTRAST TECHNIQUE: Multiplanar, multiecho pulse sequences of the brain and surrounding structures were obtained without intravenous contrast. COMPARISON:  Head CT 11/15/2016 FINDINGS: Brain: There is no evidence of acute infarct, intracranial hemorrhage, mass, midline shift, or extra-axial fluid collection. There is mild cerebral atrophy. Periventricular and scattered subcortical cerebral white matter T2 hyperintensities are nonspecific but compatible with mild chronic small vessel ischemic disease. Vascular: Major intracranial vascular flow voids are preserved. Skull and upper cervical spine: Unremarkable bone marrow signal. Sinuses/Orbits: Globes appear intact. Remote fracture involving the right maxillary sinus and inferior orbital rim. Minimal left greater than right ethmoid sinus mucosal thickening. Trace right mastoid fluid. Other: None. IMPRESSION: 1. No acute intracranial abnormality. 2. Mild chronic small vessel ischemic disease and cerebral atrophy. Electronically Signed   By: Logan Bores M.D.   On: 11/17/2016 11:42   Mr Cervical Spine Wo Contrast  Result Date: 11/17/2016 CLINICAL DATA:  Headache and neck pain. EXAM: MRI CERVICAL SPINE WITHOUT CONTRAST TECHNIQUE: Multiplanar, multisequence MR imaging of the cervical spine was performed. No intravenous contrast was administered. COMPARISON:  06/05/2011 FINDINGS: Despite the patient being pre-medicated, only 3 sequences could be obtained (sagittal T1, sagittal STIR, and axial gradient echo T2).  These are mildly to moderately motion degraded. Alignment: Reversal the normal cervical lordosis. Trace anterolisthesis of C3 on C4. Vertebrae: No evidence of fracture, suspicious osseous lesion, or significant marrow edema. Cord: Normal morphology. No definite signal abnormality identified within limitations of motion artifact. Posterior Fossa, vertebral arteries, paraspinal tissues: Unremarkable. Disc levels: C2-3: New moderate right facet arthrosis without significant stenosis. C3-4: Listhesis with mild disc uncovering and mild to moderate facet arthrosis without significant stenosis. C4-5: Moderate disc space narrowing. Right greater left uncovertebral spurring results in mild right neural foraminal  stenosis, similar to prior. No spinal stenosis. C5-6: Uncovertebral spurring results in mild bilateral neural foraminal narrowing, likely unchanged. Diffuse osteophytic ridging posteriorly (versus disc bulging on the prior) without significant spinal stenosis. C6-7: Disc bulging and uncovertebral spurring result in mild spinal stenosis and mild-to-moderate left neural foraminal stenosis, not significantly changed. C7-T1: Shallow central disc protrusion, new or larger than on the prior study though without stenosis or spinal cord mass effect. IMPRESSION: 1. Incomplete, motion degraded examination. 2. Multilevel cervical disc degeneration, most notable at C6-7 where there is unchanged mild spinal stenosis and mild-to-moderate left foraminal stenosis. 3. New right facet arthrosis at C2-3 without stenosis. 4. New/larger, shallow C7-T1 disc protrusion without stenosis. Electronically Signed   By: Logan Bores M.D.   On: 11/17/2016 11:55   Dg Chest Port 1 View  Result Date: 11/15/2016 CLINICAL DATA:  Urinary tract infection with altered mental status. EXAM: PORTABLE CHEST 1 VIEW COMPARISON:  08/31/2016 chest CT, CXR 12/07/2015 FINDINGS: The heart size and mediastinal contours are within normal limits. Chronic blunting  of the left costophrenic angle with postoperative deformity of the left posterior sixth rib in this patient who is reportedly status post partial pneumonectomy. Mild central vascular congestion. No pneumothorax or pneumonic consolidation. No acute nor worrisome osseous abnormalities. IMPRESSION: Postsurgical change of the left hemithorax. Central vascular congestion suggestive of mild CHF. Electronically Signed   By: Ashley Royalty M.D.   On: 11/15/2016 13:46   Dg Fluoro Guide Lumbar Puncture  Result Date: 11/17/2016 Michiel Cowboy, MD     11/17/2016  3:50 PM Successful lumbar puncture. 10 cc clear CSF removed and sent to the lab.  Patient tolerated procedure well with no apparent complications.     [facility], DAWOOD M.D on 11/18/2016 at 12:59 PM  Between 7am to 7pm - Pager - [phone]  After 7pm go to [email or web address] - password [email or web address]  [facility] -  Office  [phone]

## 2016-11-19 LAB — CBC
HEMATOCRIT: 34.7 % — AB (ref 39.0–52.0)
Hemoglobin: 11.1 g/dL — ABNORMAL LOW (ref 13.0–17.0)
MCH: 29.8 pg (ref 26.0–34.0)
MCHC: 32 g/dL (ref 30.0–36.0)
MCV: 93 fL (ref 78.0–100.0)
PLATELETS: 158 10*3/uL (ref 150–400)
RBC: 3.73 MIL/uL — ABNORMAL LOW (ref 4.22–5.81)
RDW: 16.4 % — AB (ref 11.5–15.5)
WBC: 13.9 10*3/uL — AB (ref 4.0–10.5)

## 2016-11-19 LAB — CBC WITH DIFFERENTIAL/PLATELET
BASOS ABS: 0 10*3/uL (ref 0.0–0.1)
BASOS PCT: 0 %
Eosinophils Absolute: 0 10*3/uL (ref 0.0–0.7)
Eosinophils Relative: 0 %
Lymphocytes Relative: 1 %
Lymphs Abs: 0.2 10*3/uL — ABNORMAL LOW (ref 0.7–4.0)
MONO ABS: 0.3 10*3/uL (ref 0.1–1.0)
MONOS PCT: 2 %
NEUTROS ABS: 13.7 10*3/uL — AB (ref 1.7–7.7)
Neutrophils Relative %: 97 %

## 2016-11-19 LAB — BASIC METABOLIC PANEL
ANION GAP: 12 (ref 5–15)
BUN: 32 mg/dL — AB (ref 6–20)
CALCIUM: 9.1 mg/dL (ref 8.9–10.3)
CO2: 22 mmol/L (ref 22–32)
CREATININE: 1.37 mg/dL — AB (ref 0.61–1.24)
Chloride: 103 mmol/L (ref 101–111)
GFR calc Af Amer: >60 mL/min (ref >60)
GFR, EST NON AFRICAN AMERICAN: 53 mL/min — AB (ref >60)
GLUCOSE: 133 mg/dL — AB (ref 65–99)
Potassium: 3.9 mmol/L (ref 3.5–5.1)
Sodium: 137 mmol/L (ref 135–145)

## 2016-11-19 LAB — GLUCOSE, CAPILLARY
Glucose-Capillary: 148 mg/dL — ABNORMAL HIGH (ref 65–99)
Glucose-Capillary: 162 mg/dL — ABNORMAL HIGH (ref 65–99)

## 2016-11-19 MED ORDER — DEXAMETHASONE 4 MG PO TABS
4.0000 mg | ORAL_TABLET | Freq: Two times a day (BID) | ORAL | Status: DC
  Administered 2016-11-19 – 2016-11-20 (×2): 4 mg via ORAL
  Filled 2016-11-19 (×4): qty 1

## 2016-11-19 MED ORDER — AMLODIPINE BESYLATE 5 MG PO TABS
10.0000 mg | ORAL_TABLET | Freq: Every day | ORAL | Status: DC
  Administered 2016-11-20: 10 mg via ORAL
  Filled 2016-11-19: qty 2

## 2016-11-19 MED ORDER — AMLODIPINE BESYLATE 5 MG PO TABS
5.0000 mg | ORAL_TABLET | Freq: Once | ORAL | Status: AC
  Administered 2016-11-19: 5 mg via ORAL
  Filled 2016-11-19: qty 1

## 2016-11-19 MED ORDER — DEXAMETHASONE 4 MG PO TABS
ORAL_TABLET | ORAL | Status: AC
  Filled 2016-11-19: qty 2

## 2016-11-19 MED ORDER — SODIUM CHLORIDE 0.9 % IV SOLN
INTRAVENOUS | Status: DC
  Administered 2016-11-19 – 2016-11-20 (×3): via INTRAVENOUS

## 2016-11-19 NOTE — Progress Notes (Signed)
PROGRESS NOTE                                                                                                                                                                                                             Patient Demographics:    Jeffrey Trujillo, is a 66 y.o. male, DOB - 16-Aug-1951, [id number]  Admit date - 11/15/2016   Admitting Physician Karmen Bongo, MD  Outpatient Primary MD for the patient is [facility]  LOS - 4  Outpatient Specialists: [facility] Dr Alen Blew.  Chief Complaint  Patient presents with  . Altered Mental Status       Brief Narrative   66 y.o. male with medical history significant of renal cell CA s/p nephrectomy with spread to the remaining kidney for which he is not being treated; iron deficiency anemia; stage 3 CKD; and an anal fistula presenting with AMS and severe Neck pain with headache  .   Subjective:    Jeffrey Trujillo today Awake alert oriented, appropriate, He denies any complaints today, reports headache and cervical neck pain almost resolved, ambulating in the hallway with no deficits , a small amount with discharge with tinged blood from his anal fistula .   Assessment  & Plan :    Principal Problem:   Encephalopathy acute Active Problems:   Renal cell cancer ([place])   Perirectal abscess   Anemia of chronic disease   Chronic neck pain   Goals of care, counseling/discussion   Palliative care encounter  Acute encephalopathy - Patient with acute worsening mental status over last 48 hours, CT head with no acute findings but poor quality, MRI brain With no acute finding as well, multi cervical spine significant for cervical disc degeneration, most notable at C6-7 , New right facet arthrosis at C2-3 without stenosis and New/larger, shallow C7-T1 disc protrusion without stenosis. - Unclear etiology, thought initially secondary to the brain or brainstem metastasis, or cervical  spine metastasis, but currently no evidence of metastasis on MRI. - Infectious Versus is less likely, but given his critical condition he was started empirically on ampicillin, vancomycin and Rocephin to cover for bacterial meningitis, but he is afebrile, with no leukocytosis, L L PICC be done by radiology on 3/3 at [facility], there are no white blood cells to be seen, has elevated glucose level,  which goes against infectious process, as well Gram stain is negative , discussed with ID via phone, especially in the setting of no fever, leukocytosis, totally negative LP recommendation is to stop antibiotics and monitor closely . - IV IV steroids appears to have been helping, which make me think since this episode most likely related to cervical radiculopathy versus severe gouty flare (even though this is non-typical location for it ), will continue with Decadron taper. -  leukocytosis with white blood cell count today of 13.9, nontoxic appearing, afebrile, this is most likely related to steroids.  Cancer related pain - Patient with significant headache, generalized pain, cervicothoracic pain, Dilaudid has been causing bradycardia overnight, transitioned to morphine, pain appears to be improving, controlled with Percocet(required to tablets yesterday)  Renal cell cancer - Patient followed by Dr.Shadad, chemotherapy is currently on hold given rectal abscess.  Anemia of chronic disease - Continue to monitor closely  Hypertension - Blood pressure uncontrolled, will increase amlodipine to 10 mg oral daily, continue with  when necessary hydralazine  AKI - Creatinine of 1.37 today, continue with IV fluids  Code Status : DO NOT RESUSCITATE  Family Communication  : None at bedside  Disposition Plan  : Home in 24 hours if continues to improve and remains afebrile  Consults  :  Palliative  Procedures  : None  DVT Prophylaxis  :  Lovenox   Lab Results  Component Value Date   PLT 158 11/19/2016     Antibiotics  :    Anti-infectives    Start     Dose/Rate Route Frequency Ordered Stop   11/16/16 2200  vancomycin (VANCOCIN) IVPB 1000 mg/200 mL premix  Status:  Discontinued     1,000 mg 200 mL/hr over 60 Minutes Intravenous Every 12 hours 11/16/16 1015 11/18/16 1225   11/16/16 1000  cefTRIAXone (ROCEPHIN) 2 g in dextrose 5 % 50 mL IVPB  Status:  Discontinued     2 g 100 mL/hr over 30 Minutes Intravenous Every 12 hours 11/16/16 0847 11/18/16 1225   11/16/16 0900  ampicillin (OMNIPEN) 2 g in sodium chloride 0.9 % 50 mL IVPB  Status:  Discontinued     2 g 150 mL/hr over 20 Minutes Intravenous Every 4 hours 11/16/16 0847 11/18/16 1225   11/16/16 0900  vancomycin (VANCOCIN) 1,500 mg in sodium chloride 0.9 % 500 mL IVPB     1,500 mg 250 mL/hr over 120 Minutes Intravenous  Once 11/16/16 0847 11/16/16 1231   11/16/16 0200  vancomycin (VANCOCIN) IVPB 1000 mg/200 mL premix  Status:  Discontinued     1,000 mg 200 mL/hr over 60 Minutes Intravenous Every 12 hours 11/15/16 1339 11/15/16 1955   11/15/16 2200  piperacillin-tazobactam (ZOSYN) IVPB 3.375 g  Status:  Discontinued     3.375 g 12.5 mL/hr over 240 Minutes Intravenous Every 8 hours 11/15/16 1338 11/15/16 1955   11/15/16 1345  vancomycin (VANCOCIN) 1,500 mg in sodium chloride 0.9 % 500 mL IVPB     1,500 mg 250 mL/hr over 120 Minutes Intravenous  Once 11/15/16 1330 11/15/16 1646   11/15/16 1330  piperacillin-tazobactam (ZOSYN) IVPB 3.375 g     3.375 g 100 mL/hr over 30 Minutes Intravenous  Once 11/15/16 1322 11/15/16 1413   11/15/16 1330  vancomycin (VANCOCIN) IVPB 1000 mg/200 mL premix  Status:  Discontinued     1,000 mg 200 mL/hr over 60 Minutes Intravenous  Once 11/15/16 1322 11/15/16 1330        Objective:   Vitals:  11/18/16 1600 11/18/16 1700 11/18/16 2151 11/19/16 0601  BP: (!) 157/82 (!) 143/103 (!) 164/79 (!) 178/73  Pulse: 87 83 63 (!) 59  Resp: 17 15 20 18   Temp: 98.2 F (36.8 C)  98.2 F (36.8 C) 97.8 F (36.6  C)  TempSrc: Oral  Oral Oral  SpO2: 98% 98% 99% 100%  Weight:      Height:        Wt Readings from Last 3 Encounters:  11/18/16 80.1 kg (176 lb 9.4 oz)  11/09/16 79.7 kg (175 lb 12.8 oz)  10/29/16 82.6 kg (182 lb)     Intake/Output Summary (Last 24 hours) at 11/19/16 1100 Last data filed at 11/19/16 0237  Gross per 24 hour  Intake              720 ml  Output             1100 ml  Net             -380 ml     Physical Exam  Awake alert oriented 3, laying in bed in no apparent distress Symmetrical Chest wall movement, Good air movement bilaterally, No wheezing Regular rate and rhythm,No Gallops,Rubs or new Murmurs, No Parasternal Heave +ve B.Sounds, Abd Soft, No tenderness,  No rebound - guarding or rigidity. No Cyanosis, Clubbing or edema, patient with a normal focal tenderness to palpation in the mid cervical spine area, but otherwise negative meningeal signs     Data Review:    CBC  Recent Labs Lab 11/15/16 1150 11/16/16 0532 11/17/16 0549 11/18/16 0815 11/19/16 0853  WBC 6.9 7.6 7.7 12.2* 13.9*  HGB 10.7* 9.8* 10.4* 10.1* 11.1*  HCT 33.0* 30.6* 31.9* 30.6* 34.7*  PLT 165 130* 164 158 158  MCV 93.2 92.2 91.7 91.6 93.0  MCH 30.2 29.5 29.9 30.2 29.8  MCHC 32.4 32.0 32.6 33.0 32.0  RDW 17.2* 16.9* 16.3* 16.2* 16.4*    Chemistries   Recent Labs Lab 11/15/16 1150 11/16/16 0532 11/17/16 0549 11/18/16 0815 11/19/16 0853  NA 137 139 137 140 137  K 3.6 3.7 4.0 3.6 3.9  CL 103 107 101 102 103  CO2 27 25 26 30 22   GLUCOSE 106* 94 150* 155* 133*  BUN 18 15 21* 26* 32*  CREATININE 1.26* 1.17 1.14 1.20 1.37*  CALCIUM 9.1 8.7* 9.1 9.1 9.1  AST 16  --   --   --   --   ALT 16*  --   --   --   --   ALKPHOS 56  --   --   --   --   BILITOT 1.0  --   --   --   --    ------------------------------------------------------------------------------------------------------------------ No results for input(s): CHOL, HDL, LDLCALC, TRIG, CHOLHDL, LDLDIRECT in the last  72 hours.  No results found for: HGBA1C ------------------------------------------------------------------------------------------------------------------ No results for input(s): TSH, T4TOTAL, T3FREE, THYROIDAB in the last 72 hours.  Invalid input(s): FREET3 ------------------------------------------------------------------------------------------------------------------ No results for input(s): VITAMINB12, FOLATE, FERRITIN, TIBC, IRON, RETICCTPCT in the last 72 hours.  Coagulation profile  Recent Labs Lab 11/15/16 1150  INR 1.08    No results for input(s): DDIMER in the last 72 hours.  Cardiac Enzymes No results for input(s): CKMB, TROPONINI, MYOGLOBIN in the last 168 hours.  Invalid input(s): CK ------------------------------------------------------------------------------------------------------------------    Component Value Date/Time   BNP 57.0 01/03/2015 1043    Inpatient Medications  Scheduled Meds: . [START ON 11/20/2016] amLODipine  10 mg Oral Daily  .  amLODipine  5 mg Oral Once  . colchicine  0.6 mg Oral BID  . dexamethasone  4 mg Oral Q12H  . docusate sodium  100 mg Oral Q12H  . pantoprazole  40 mg Oral BID  . saccharomyces boulardii  250 mg Oral BID  . sodium chloride flush  3 mL Intravenous Q12H  . topiramate  25 mg Oral BID   Continuous Infusions: . sodium chloride     PRN Meds:.acetaminophen **OR** acetaminophen, hydrALAZINE, oxyCODONE-acetaminophen  Micro Results Recent Results (from the past 240 hour(s))  Blood Culture (routine x 2)     Status: None (Preliminary result)   Collection Time: 11/15/16  1:33 PM  Result Value Ref Range Status   Specimen Description RIGHT ANTECUBITAL  Final   Special Requests BOTTLES DRAWN AEROBIC AND ANAEROBIC 10CC EACH  Final   Culture NO GROWTH 3 DAYS  Final   Report Status PENDING  Incomplete  Blood Culture (routine x 2)     Status: None (Preliminary result)   Collection Time: 11/15/16  1:37 PM  Result Value Ref  Range Status   Specimen Description LEFT ANTECUBITAL  Final   Special Requests BOTTLES DRAWN AEROBIC ONLY 10 CC AEB  Final   Culture NO GROWTH 3 DAYS  Final   Report Status PENDING  Incomplete  Urine culture     Status: Abnormal   Collection Time: 11/15/16  1:50 PM  Result Value Ref Range Status   Specimen Description URINE, CATHETERIZED  Final   Special Requests NONE  Final   Culture (A)  Final    <10,000 COLONIES/mL INSIGNIFICANT GROWTH Performed at [facility], [address] [place], [place] [address]    Report Status 11/17/2016 FINAL  Final  MRSA PCR Screening     Status: None   Collection Time: 11/15/16  8:02 PM  Result Value Ref Range Status   MRSA by PCR NEGATIVE NEGATIVE Final    Comment:        The GeneXpert MRSA Assay (FDA approved for NASAL specimens only), is one component of a comprehensive MRSA colonization surveillance program. It is not intended to diagnose MRSA infection nor to guide or monitor treatment for MRSA infections.   CSF culture     Status: None (Preliminary result)   Collection Time: 11/17/16  4:09 PM  Result Value Ref Range Status   Specimen Description CSF  Final   Special Requests NONE  Final   Gram Stain NO WBC SEEN NO ORGANISMS SEEN   Final   Culture   Final    NO GROWTH 2 DAYS Performed at [facility], [address] [place], [place] [address]    Report Status PENDING  Incomplete  Culture, fungus without smear     Status: None (Preliminary result)   Collection Time: 11/17/16  4:09 PM  Result Value Ref Range Status   Specimen Description CSF  Final   Special Requests NONE  Final   Culture   Final    NO FUNGUS ISOLATED AFTER 1 DAY Performed at [facility], [place] [address] [place], [place] [address]    Report Status PENDING  Incomplete    Radiology Reports Ct Abdomen Pelvis Wo Contrast  Result Date: 11/15/2016 CLINICAL DATA:  Urinary urgency and incontinence. EXAM: CT ABDOMEN AND PELVIS WITHOUT CONTRAST  TECHNIQUE: Multidetector CT imaging of the abdomen and pelvis was performed following the standard protocol without IV contrast. COMPARISON:  CT 08/31/2016. FINDINGS: Lower chest: Breathing motion artifact but no  obvious pulmonary lesion or acute pulmonary abnormality. The heart is normal in size. No pericardial effusion. Hepatobiliary: No focal hepatic lesions or intrahepatic biliary dilatation. The gallbladder is grossly normal. Pancreas: Stable lesion in the pancreatic body measuring 20 x 18 mm on image number 28. No ductal dilatation. Spleen: Normal size.  No focal lesions. Adrenals/Urinary Tract: Solid left adrenal gland lesion measures 2 x 2 cm and previously measured 1.8 x 1.8 cm. The right adrenal gland is normal. Stable exophytic slightly complex left renal cyst. There is an enlarging solid upper pole Mass obliterating the upper pole collecting system. Expanded and hyperdense left pulmonary vein consistent with tumor invasion. No hydronephrosis. No ureteral or bladder calculi. The right kidney is surgically absent. Stomach/Bowel: The stomach, duodenum, small bowel and colon are grossly normal. No acute inflammatory changes, mass lesions or obstructive findings. Colonic diverticulosis is noted. Vascular/Lymphatic: The aorta and branch vessels are grossly normal without contrast. No aneurysm. No mesenteric or retroperitoneal mass or adenopathy. Reproductive: Surgical changes from TURP. Seminal vesicles are grossly normal. Other: No pelvic mass or adenopathy. No free pelvic fluid collections. No inguinal mass or adenopathy. No abdominal wall hernia or subcutaneous lesions. Musculoskeletal: No significant bony findings. No lytic/destructive bone lesions are identified. IMPRESSION: 1. Enlarging left upper pole solid renal mass measuring approximately 5.6 cm with evidence of tumor invading the proximal renal vein which is hyperdense and distended. 2. Slight interval enlargement of a left adrenal gland lesion. 3.  Slight interval enlargement of the pancreatic body lesion. 4. No findings for metastatic disease involving the liver, lung bases or bony structures. 5. No renal, ureteral or bladder calculi. Electronically Signed   By: Marijo Sanes M.D.   On: 11/15/2016 17:03   Ct Head Wo Contrast  Result Date: 11/15/2016 CLINICAL DATA:  Altered mental status EXAM: CT HEAD WITHOUT CONTRAST TECHNIQUE: Contiguous axial images were obtained from the base of the skull through the vertex without intravenous contrast. COMPARISON:  None. FINDINGS: Brain: Image quality degraded by moderate motion. Repeat attempts were attempted but there was excessive motion on the repeat images. Mild atrophy. Negative for hydrocephalus. Negative for hemorrhage or mass. No acute infarct identified. Vascular: No hyperdense vessel or unexpected calcification. Skull: Negative Sinuses/Orbits: Negative Other: None IMPRESSION: No acute abnormality.  Image quality degraded by moderate motion. Electronically Signed   By: Franchot Gallo M.D.   On: 11/15/2016 12:26   Mr Brain Wo Contrast  Result Date: 11/17/2016 CLINICAL DATA:  Headache and neck pain. EXAM: MRI HEAD WITHOUT CONTRAST TECHNIQUE: Multiplanar, multiecho pulse sequences of the brain and surrounding structures were obtained without intravenous contrast. COMPARISON:  Head CT 11/15/2016 FINDINGS: Brain: There is no evidence of acute infarct, intracranial hemorrhage, mass, midline shift, or extra-axial fluid collection. There is mild cerebral atrophy. Periventricular and scattered subcortical cerebral white matter T2 hyperintensities are nonspecific but compatible with mild chronic small vessel ischemic disease. Vascular: Major intracranial vascular flow voids are preserved. Skull and upper cervical spine: Unremarkable bone marrow signal. Sinuses/Orbits: Globes appear intact. Remote fracture involving the right maxillary sinus and inferior orbital rim. Minimal left greater than right ethmoid sinus  mucosal thickening. Trace right mastoid fluid. Other: None. IMPRESSION: 1. No acute intracranial abnormality. 2. Mild chronic small vessel ischemic disease and cerebral atrophy. Electronically Signed   By: Logan Bores M.D.   On: 11/17/2016 11:42   Mr Cervical Spine Wo Contrast  Result Date: 11/17/2016 CLINICAL DATA:  Headache and neck pain. EXAM: MRI CERVICAL SPINE WITHOUT CONTRAST TECHNIQUE: Multiplanar,  multisequence MR imaging of the cervical spine was performed. No intravenous contrast was administered. COMPARISON:  06/05/2011 FINDINGS: Despite the patient being pre-medicated, only 3 sequences could be obtained (sagittal T1, sagittal STIR, and axial gradient echo T2). These are mildly to moderately motion degraded. Alignment: Reversal the normal cervical lordosis. Trace anterolisthesis of C3 on C4. Vertebrae: No evidence of fracture, suspicious osseous lesion, or significant marrow edema. Cord: Normal morphology. No definite signal abnormality identified within limitations of motion artifact. Posterior Fossa, vertebral arteries, paraspinal tissues: Unremarkable. Disc levels: C2-3: New moderate right facet arthrosis without significant stenosis. C3-4: Listhesis with mild disc uncovering and mild to moderate facet arthrosis without significant stenosis. C4-5: Moderate disc space narrowing. Right greater left uncovertebral spurring results in mild right neural foraminal stenosis, similar to prior. No spinal stenosis. C5-6: Uncovertebral spurring results in mild bilateral neural foraminal narrowing, likely unchanged. Diffuse osteophytic ridging posteriorly (versus disc bulging on the prior) without significant spinal stenosis. C6-7: Disc bulging and uncovertebral spurring result in mild spinal stenosis and mild-to-moderate left neural foraminal stenosis, not significantly changed. C7-T1: Shallow central disc protrusion, new or larger than on the prior study though without stenosis or spinal cord mass effect.  IMPRESSION: 1. Incomplete, motion degraded examination. 2. Multilevel cervical disc degeneration, most notable at C6-7 where there is unchanged mild spinal stenosis and mild-to-moderate left foraminal stenosis. 3. New right facet arthrosis at C2-3 without stenosis. 4. New/larger, shallow C7-T1 disc protrusion without stenosis. Electronically Signed   By: Logan Bores M.D.   On: 11/17/2016 11:55   Dg Chest Port 1 View  Result Date: 11/15/2016 CLINICAL DATA:  Urinary tract infection with altered mental status. EXAM: PORTABLE CHEST 1 VIEW COMPARISON:  08/31/2016 chest CT, CXR 12/07/2015 FINDINGS: The heart size and mediastinal contours are within normal limits. Chronic blunting of the left costophrenic angle with postoperative deformity of the left posterior sixth rib in this patient who is reportedly status post partial pneumonectomy. Mild central vascular congestion. No pneumothorax or pneumonic consolidation. No acute nor worrisome osseous abnormalities. IMPRESSION: Postsurgical change of the left hemithorax. Central vascular congestion suggestive of mild CHF. Electronically Signed   By: Ashley Royalty M.D.   On: 11/15/2016 13:46   Dg Fluoro Guide Lumbar Puncture  Result Date: 11/17/2016 Michiel Cowboy, MD     11/17/2016  3:50 PM Successful lumbar puncture. 10 cc clear CSF removed and sent to the lab.  Patient tolerated procedure well with no apparent complications.     [facility], Aretta Stetzel M.D on 11/19/2016 at 11:00 AM  Between 7am to 7pm - Pager - [phone]  After 7pm go to [email or web address] - password [facility]  [facility] -  Office  [phone]

## 2016-11-19 NOTE — Evaluation (Signed)
Physical Therapy Evaluation Patient Details Name: Jeffrey Trujillo MRN: [id number] DOB: 1951/08/24 Today's Date: 11/19/2016   History of Present Illness  66 y.o. male with medical history significant of renal cell CA s/p nephrectomy with spread to the remaining kidney for which he is not being treated; iron deficiency anemia; stage 3 CKD; and an anal fistula presenting with AMS and severe Neck pain with headache      Clinical Impression  Pt received in bed, and was agreeable to PT evaluation.  Pt is normally independent with ambulation, and ADL's.  During PT evaluation, pt ambulated 446ft independently with no balance deficits with vertical and horizontal head turns, ramps or picking items up from the floor.  Pt does not demonstrate need for skilled acute PT at this time, and therefore PT will sign off.      Follow Up Recommendations No PT follow up    Equipment Recommendations  None recommended by PT    Recommendations for Other Services       Precautions / Restrictions Precautions Precautions: Fall Precaution Comments: Pt states that he slid down out of the recliner onto his knee the other day Restrictions Weight Bearing Restrictions: No      Mobility  Bed Mobility Overal bed mobility: Independent                Transfers Overall transfer level: Independent Equipment used: None                Ambulation/Gait Ambulation/Gait assistance: Independent Ambulation Distance (Feet): 400 Feet Assistive device: None          Stairs            Wheelchair Mobility    Modified Rankin (Stroke Patients Only)       Balance Overall balance assessment: History of Falls;No apparent balance deficits (not formally assessed)                                           Pertinent Vitals/Pain Pain Assessment: 0-10 Pain Score: 2  Pain Location: neck and back Pain Intervention(s): Limited activity within patient's tolerance;Monitored during  session;Repositioned    Home Living   Living Arrangements: Spouse/significant other   Type of Home: House Home Access: Ramped entrance     Home Layout: One level Home Equipment: Walker - 2 wheels;Cane - single point      Prior Function Level of Independence: Independent               Hand Dominance        Extremity/Trunk Assessment   Upper Extremity Assessment Upper Extremity Assessment: Overall WFL for tasks assessed    Lower Extremity Assessment Lower Extremity Assessment: Overall WFL for tasks assessed       Communication   Communication: No difficulties  Cognition Arousal/Alertness: Awake/alert Behavior During Therapy: WFL for tasks assessed/performed Overall Cognitive Status: Within Functional Limits for tasks assessed                      General Comments      Exercises     Assessment/Plan    PT Assessment Patent does not need any further PT services  PT Problem List         PT Treatment Interventions      PT Goals (Current goals can be found in the Care Plan section)  Acute Rehab PT Goals  PT Goal Formulation: All assessment and education complete, DC therapy    Frequency     Barriers to discharge        Co-evaluation               End of Session Equipment Utilized During Treatment: Gait belt Activity Tolerance: Patient tolerated treatment well Patient left: in bed;with call bell/phone within reach;with nursing/sitter in room Nurse Communication: Mobility status (Mobility sheet left hanging in the room. ) PT Visit Diagnosis: Muscle weakness (generalized) (M62.81)    Functional Assessment Tool Used: AM-PAC 6 Clicks Basic Mobility;Clinical judgement Functional Limitation: Mobility: Walking and moving around Mobility: Walking and Moving Around Current Status [phone]): 0 percent impaired, limited or restricted Mobility: Walking and Moving Around Goal Status [phone]): 0 percent impaired, limited or restricted Mobility:  Walking and Moving Around Discharge Status [phone]): 0 percent impaired, limited or restricted    Time: 1317-1340 PT Time Calculation (min) (ACUTE ONLY): 23 min   Charges:   PT Evaluation $PT Eval Low Complexity: 1 Procedure PT Treatments $Gait Training: 8-22 mins   PT G Codes:   PT G-Codes **NOT FOR INPATIENT CLASS** Functional Assessment Tool Used: AM-PAC 6 Clicks Basic Mobility;Clinical judgement Functional Limitation: Mobility: Walking and moving around Mobility: Walking and Moving Around Current Status [id number]): 0 percent impaired, limited or restricted Mobility: Walking and Moving Around Goal Status [id number]): 0 percent impaired, limited or restricted Mobility: Walking and Moving Around Discharge Status [id number]): 0 percent impaired, limited or restricted     Beth Joselinne Lawal, PT, DPT X: [id number]

## 2016-11-19 NOTE — Progress Notes (Signed)
Daily Progress Note   Patient Name: Jeffrey Trujillo       Date: 11/19/2016 DOB: [birth year]  Age: 66 y.o. MRN#: [id number] Attending Physician: Albertine Patricia, MD Primary Care Physician: [place] Date: 11/15/2016  Reason for Consultation/Follow-up: Disposition, Establishing goals of care and Psychosocial/spiritual support  Subjective: Mr. Gelvin is resting quietly in bed. He greets me making and keeping eye contact as I enter. He has been up walking in the hallway earlier today. Present today at bedside is sister Cheri Guppy and her husband.  Mr. Pottorf denies pain or concerns at this time. He shares that he has a follow-up with his trusted oncologist of 8 years [name] sometime around April 9 or 12th.  We talk briefly about his chemo regimen, he shares that he takes pills daily and follows up with oncologist every 1-3 months. I encourage Mr. Millwee to let [name] office know that he has been hospitalized, and ask if he needs an earlier appointment.  We talk about pain management, he states that he no longer sees the pain clinic, but believes that his primary care provider, [place], will provide pain medication if he needs it. We talk about making a plan for pain management before he gets out of hand. Mr. Messerli has no questions or concerns at this time, but his sister [name] asks, "what about bringing him back to the hospital?". I share with [name] that Mr. Plourd to come back to the hospital anytime that he feels he needs to, that I am encouraging family to make a plan regarding future pain management.  Length of Stay: 4  Current Medications: Scheduled Meds:  . [START ON 11/20/2016] amLODipine  10 mg Oral Daily  . colchicine  0.6 mg Oral  BID  . dexamethasone  4 mg Oral Q12H  . docusate sodium  100 mg Oral Q12H  . pantoprazole  40 mg Oral BID  . saccharomyces boulardii  250 mg Oral BID  . sodium chloride flush  3 mL Intravenous Q12H  . topiramate  25 mg Oral BID    Continuous Infusions: . sodium chloride 100 mL/hr at 11/19/16 1105    PRN Meds: acetaminophen **OR** acetaminophen, hydrALAZINE, oxyCODONE-acetaminophen  Physical Exam  Constitutional: He is oriented to person, place, and time.  No distress.  HENT:  Head: Normocephalic and atraumatic.  Cardiovascular: Normal rate and regular rhythm.   Rate 50's at times  Pulmonary/Chest: Effort normal. No respiratory distress.  Abdominal: Soft. He exhibits no distension.  Musculoskeletal: He exhibits no edema.  Neurological: He is alert and oriented to person, place, and time.  Skin: Skin is warm and dry.  Nursing note and vitals reviewed.           Vital Signs: BP (!) 178/73 (BP Location: Left Arm)   Pulse (!) 59   Temp 97.8 F (36.6 C) (Oral)   Resp 18   Ht 5\' 11"  (1.803 m)   Wt 80.1 kg (176 lb 9.4 oz)   SpO2 100%   BMI 24.63 kg/m  SpO2: SpO2: 100 % O2 Device: O2 Device: Not Delivered O2 Flow Rate: O2 Flow Rate (L/min): 4 L/min  Intake/output summary:  Intake/Output Summary (Last 24 hours) at 11/19/16 1255 Last data filed at 11/19/16 [id number]  Trujillo per 24 hour  Intake              480 ml  Output              700 ml  Net             -220 ml   LBM: Last BM Date: 11/19/16 Baseline Weight: Weight: 79.4 kg (175 lb) Most recent weight: Weight: 80.1 kg (176 lb 9.4 oz)       Palliative Assessment/Data:    Flowsheet Rows   Flowsheet Row Most Recent Value  Intake Tab  Referral Department  Hospitalist  Unit at Time of Referral  ICU  Palliative Care Primary Diagnosis  Cancer  Date Notified  11/16/16  Palliative Care Type  New Palliative care  Reason for referral  Clarify Goals of Care  Date of Admission  11/15/16  Date first seen by Palliative Care   11/16/16  # of days Palliative referral response time  0 Day(s)  # of days IP prior to Palliative referral  1  Clinical Assessment  Palliative Performance Scale Score  20%  Pain Max last 24 hours  Not able to report  Pain Min Last 24 hours  Not able to report  Dyspnea Max Last 24 Hours  Not able to report  Dyspnea Min Last 24 hours  Not able to report  Psychosocial & Spiritual Assessment  Palliative Care Outcomes  Patient/Family meeting held?  Yes [Sister and brother-in-law at bedside]  Who was at the meeting?  Sister and brother-in-law at bedside  [place]  Provided psychosocial or spiritual support  Patient/Family wishes: Interventions discontinued/not started   Mechanical Ventilation      Patient Active Problem List   Diagnosis Date Noted  . Chronic neck pain   . Goals of care, counseling/discussion   . Palliative care encounter   . Encephalopathy acute 11/15/2016  . Fever 12/07/2015  . Gouty arthritis 12/07/2015  . Fall   . Pyrexia   . Acute gout 11/15/2015  . Perirectal abscess 11/12/2015  . Hypokalemia 11/12/2015  . Anemia of chronic disease 11/12/2015  . Gallstones   . Sinus bradycardia 02/14/2015  . Malnutrition of moderate degree ([place]) 02/12/2015  . Elevated LFTs 02/12/2015  . Abdominal pain, right upper quadrant 02/12/2015  . Acute kidney injury ([place]) 02/12/2015  . Cholecystitis 02/11/2015  . Hiatal hernia   . Chest pain 04/15/2014  . PUD (peptic ulcer disease) 04/21/2013  . Melena 03/10/2013  . Abdominal pain 03/10/2013  . Leukopenia  03/10/2013  . Thrombocytopenia ([place]) 03/10/2013  . Abdominal abscess ([place]) 03/10/2013  . Anemia 03/10/2013  . ETOH abuse 03/10/2013  . Bradycardia 03/10/2013  . Barrett's esophagus 01/04/2013  . Renal cell cancer ([place]) 09/25/2012  . Lower GI bleed 09/25/2012  . Anemia due to blood loss 09/25/2012  . Acute gout of right foot 09/25/2012  . DJD (degenerative joint disease) 09/25/2012  . Pancreatic mass  08/04/2012  . Acute strain of neck muscle 06/05/2011  . RECTAL BLEEDING 10/20/2009  . NAUSEA WITH VOMITING 10/20/2009  . DIARRHEA, BLOODY 10/20/2009    Palliative Care Assessment & Plan   Patient Profile: Mr. Lunday is a 66 year old male with a medical history significant for renal cell cancer status post nephrectomy with spread to the remaining kidney. He is not being treated for the cancer per Dr. should died in Oct 17, 2023 he took a few treatments but ended up with problems from a known anal fistula abscess that had surgical repair February 2017 the required drain placement with complex wound healing. He also has history of iron deficiency anemia and stage IIICKD, Barrett's esophagus, diverticulosis of colon, GER D, peptic ulcer disease, gouty arthritis, DJD admitted on 11/15/16 for  acute encephalopathy.  Assessment: History of chronic worsening neck pain: hx of pain management clinic with injections, opiates from PCP, seen in ED for neck pain 10/29/16.  MRI of brain when patient is able to cooperate.  LP negative, no acute cerebral process. Encephalopathy improved. Acute encephalopathy: support for pan management, fluid, and antibiotics. MRI of brain when patient is able to cooperate. LP negative, no acute cerebral process. Encephalopathy improved.   Recommendations/Plan:  continue to treat the treatable, follow up appointment with Oncology, [name] scheduled for mid April.  I encourage patient to make [name] aware and ask if he needs to be seen sooner.   Goals of Care and Additional Recommendations:  Limitations on Scope of Treatment: Treat the treatable but no extraordinary measures such as CPR or intubation. Mr. Vasas to is agreeable to continue with cancer treatments as long as possible.  Code Status:    Code Status Orders        Start     Ordered   11/15/16 1956  Do not attempt resuscitation (DNR)  Continuous    Question Answer Comment  In the event of cardiac or  respiratory ARREST Do not call a "code blue"   In the event of cardiac or respiratory ARREST Do not perform Intubation, CPR, defibrillation or ACLS   In the event of cardiac or respiratory ARREST Use medication by any route, position, wound care, and other measures to relive pain and suffering. May use oxygen, suction and manual treatment of airway obstruction as needed for comfort.      11/15/16 1955    Code Status History    Date Active Date Inactive Code Status Order ID Comments User Context   12/07/2015 10:11 PM 12/09/2015  5:07 PM Full Code [id number]  Phillips Grout, MD Inpatient   11/12/2015  6:38 PM 11/18/2015  9:02 PM Full Code [id number]  Erline Hau, MD Inpatient   02/11/2015 11:37 PM 02/15/2015  3:56 PM Full Code [id number]  Truett Mainland, DO Inpatient   04/14/2014  7:04 PM 04/15/2014 11:15 PM Full Code [id number]  Doree Albee, MD ED   03/10/2013 11:51 AM 03/12/2013  5:41 PM Full Code [id number]  Radene Gunning, NP ED   09/25/2012  7:32 AM 09/30/2012  8:07 PM Full Code  [id number]  Yehuda Budd, RN Inpatient       Prognosis:   Unable to determine, based on outcomes.  Discharge Planning:  No needs identified at this time. Return home with the support of spouse.  Care plan was discussed with nursing staff, case manager, social worker, and Dr. Waldron Labs.  Thank you for allowing the Palliative Medicine Team to assist in the care of this patient.   Time In: 1145 Time Out: 1205 Total Time 20 minutes Prolonged Time Billed  no       Greater than 50%  of this time was spent counseling and coordinating care related to the above assessment and plan.  Drue Novel, NP  Please contact Palliative Medicine Team phone at [phone] for questions and concerns.

## 2016-11-19 NOTE — Care Management Note (Signed)
Case Management Note  Patient Details  Name: Jeffrey Trujillo MRN: [id number] Date of Birth: [birth year]  Subjective/Objective:                  Pt admitted encephalopathy. He is from home, lives with his wife. He is ind with ADL's. He has PCP, transportation to appointments and no difficulty affording or managing his medications. He plans to return home with self care. No DME or HH need.   Action/Plan: Anticipate DC home tomorrow with self care.   Expected Discharge Date:       11/20/2016           Expected Discharge Plan:  Home/Self Care  In-House Referral:  NA  Discharge planning Services  CM Consult  Post Acute Care Choice:  NA Choice offered to:  NA  Status of Service:  Completed, signed off  Sherald Barge, RN 11/19/2016, 11:15 AM

## 2016-11-20 DIAGNOSIS — Z85528 Personal history of other malignant neoplasm of kidney: Secondary | ICD-10-CM

## 2016-11-20 LAB — CULTURE, BLOOD (ROUTINE X 2)
CULTURE: NO GROWTH
CULTURE: NO GROWTH

## 2016-11-20 LAB — CSF CULTURE W GRAM STAIN: Gram Stain: NONE SEEN

## 2016-11-20 LAB — BASIC METABOLIC PANEL
Anion gap: 7 (ref 5–15)
BUN: 31 mg/dL — AB (ref 6–20)
CHLORIDE: 104 mmol/L (ref 101–111)
CO2: 26 mmol/L (ref 22–32)
CREATININE: 1.37 mg/dL — AB (ref 0.61–1.24)
Calcium: 8.6 mg/dL — ABNORMAL LOW (ref 8.9–10.3)
GFR calc Af Amer: >60 mL/min (ref >60)
GFR calc non Af Amer: 53 mL/min — ABNORMAL LOW (ref >60)
GLUCOSE: 112 mg/dL — AB (ref 65–99)
Potassium: 3.6 mmol/L (ref 3.5–5.1)
Sodium: 137 mmol/L (ref 135–145)

## 2016-11-20 LAB — CSF CULTURE: CULTURE: NO GROWTH

## 2016-11-20 MED ORDER — AMLODIPINE BESYLATE 10 MG PO TABS: 10.0000 mg | ORAL_TABLET | Freq: Every day | ORAL | 0 refills | Status: DC

## 2016-11-20 MED ORDER — DEXAMETHASONE 2 MG PO TABS: ORAL_TABLET | ORAL | 0 refills | Status: DC

## 2016-11-20 NOTE — Discharge Instructions (Signed)
Follow with Primary [facility] in 7 days   Get CBC, CMP,  checked  by Primary MD next visit.    Activity: As tolerated with Full fall precautions use walker/cane & assistance as needed   Disposition Home    Diet: Heart Healthy  , with feeding assistance and aspiration precautions.   On your next visit with your primary care physician please Get Medicines reviewed and adjusted.   Please request your Prim.MD to go over all Hospital Tests and Procedure/Radiological results at the follow up, please get all Hospital records sent to your Prim MD by signing hospital release before you go home.   If you experience worsening of your admission symptoms, develop shortness of breath, life threatening emergency, suicidal or homicidal thoughts you must seek medical attention immediately by calling 911 or calling your MD immediately  if symptoms less severe.  You Must read complete instructions/literature along with all the possible adverse reactions/side effects for all the Medicines you take and that have been prescribed to you. Take any new Medicines after you have completely understood and accpet all the possible adverse reactions/side effects.   Do not drive, operating heavy machinery, perform activities at heights, swimming or participation in water activities or provide baby sitting services if your were admitted for syncope or siezures until you have seen by Primary MD or a Neurologist and advised to do so again.  Do not drive when taking Pain medications.    Do not take more than prescribed Pain, Sleep and Anxiety Medications  Special Instructions: If you have smoked or chewed Tobacco  in the last 2 yrs please stop smoking, stop any regular Alcohol  and or any Recreational drug use.  Wear Seat belts while driving.   Please note  You were cared for by a hospitalist during your hospital stay. If you have any questions about your discharge medications or the  care you received while you were in the hospital after you are discharged, you can call the unit and asked to speak with the hospitalist on call if the hospitalist that took care of you is not available. Once you are discharged, your primary care physician will handle any further medical issues. Please note that NO REFILLS for any discharge medications will be authorized once you are discharged, as it is imperative that you return to your primary care physician (or establish a relationship with a primary care physician if you do not have one) for your aftercare needs so that they can reassess your need for medications and monitor your lab values.

## 2016-11-20 NOTE — Discharge Summary (Signed)
Jeffrey Trujillo, is a 66 y.o. male  DOB 03/26/1951  MRN [id number].  Admission date:  11/15/2016  Admitting Physician  Karmen Bongo, MD  Discharge Date:  11/20/2016   Primary [facility]  Recommendations for primary care physician for things to follow:  - Please check CBC, BMP during next visit - Patient instructed to follow with general surgery in 2 weeks regarding his Anorectal fistula, and  to keep his appointment with oncology for early April.   Admission Diagnosis  Chronic neck pain [M54.2, G89.29] Hematuria, microscopic [R31.29] Generalized weakness [R53.1] Elevated lactic acid level [R79.89] Symptoms of urinary tract infection [R39.9] History of renal cell cancer [Z85.528] Altered mental status, unspecified altered mental status type [R41.82] Encephalopathy acute [G93.40]   Discharge Diagnosis  Chronic neck pain [M54.2, G89.29] Hematuria, microscopic [R31.29] Generalized weakness [R53.1] Elevated lactic acid level [R79.89] Symptoms of urinary tract infection [R39.9] History of renal cell cancer [Z85.528] Altered mental status, unspecified altered mental status type [R41.82] Encephalopathy acute [G93.40]    Principal Problem:   Encephalopathy acute Active Problems:   Renal cell cancer (HCC)   Perirectal abscess   Anemia of chronic disease   Chronic neck pain   Goals of care, counseling/discussion   Palliative care encounter      Past Medical History:  Diagnosis Date  . Anal fistula   . Anemia in chronic renal disease   . Barrett's esophagus    by EGD 11/02/2009  . Chronic pain   . CKD (chronic kidney disease), stage III   . Diverticulosis of colon    by colonoscopy 11/02/2009  . DJD (degenerative joint disease) of cervical spine   . GERD (gastroesophageal reflux disease)   . Gouty arthritis    per pt on 05-16-2016 stable (last bout bilatearl  wrist 03/ 2017)  . History of diverticulitis of colon    w/ small perforation , no surgical intervention 06/ 2014  . History of peptic ulcer disease   . Iron deficiency anemia due to chronic blood loss   . 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 Nocturia   . 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 Wears glasses     Past Surgical History:  Procedure Laterality Date  . COLONOSCOPY  Feb 2011   [name]: single anal papilla, pan-colonic diverticula  . COLONOSCOPY  09/25/2012   RMR: Colonic diverticulosis. Suspect diverticular bleeding-hepatic flexure tic-sealed  . ESOPHAGOGASTRODUODENOSCOPY  Feb 2011   [name]: salmon-colored epithelium consistent with Barrett's, nodular antral erosions, small hiatal hernia, negative H.pylori  . ESOPHAGOGASTRODUODENOSCOPY  09/25/2012   [id number] esophagus. Small hiatal hernia. Antral erosions of doubtful clinical significance, no biopsies  .  ESOPHAGOGASTRODUODENOSCOPY N/A 03/11/2013   SLF: Barrett's esophagus/Single ulcer in the gastric antrum-PROBABLE SOURCE FOR BLACK STOOL/ Chronic gastritis in the gastric antrum  . ESOPHAGOGASTRODUODENOSCOPY N/A 10/11/2014   Dr. [name] barrett's s/p biopsy. Hiatal hernia. Biopsy with intestinal metaplasia, glandular atypia indefinite for low grade dysplasia noted as well  . ESOPHAGOGASTRODUODENOSCOPY N/A 01/19/2015   [name]: Barrett's no dysplasia  . EUS  08/07/2012   Procedure: UPPER ENDOSCOPIC ULTRASOUND (EUS) LINEAR;  Surgeon: [name], MD;  Location: [facility] ENDOSCOPY;  Service: Endoscopy;  Laterality: N/A;  . EUS  08/21/2012   Procedure: UPPER ENDOSCOPIC ULTRASOUND (EUS) LINEAR;  Surgeon: [name], MD;   Location: [facility] ENDOSCOPY;  Service: Endoscopy;  Laterality: N/A;  . INCISION AND DRAINAGE ABSCESS N/A 11/13/2015   Procedure: INCISION AND DRAINAGE ABSCESS;  Surgeon: Aviva Signs, MD;  Location: [facility]S;  Service: General;  Laterality: N/A;  . INGUINAL HERNIA REPAIR Bilateral 1990's  . LAPAROSCOPIC NEPHRECTOMY Right 06/27/2009   dr Alinda Money  . LUNG REMOVAL, PARTIAL  1970   left lower lobe, benign       History of present illness and  Hospital Course:     Kindly see H&P for history of present illness and admission details, please review complete Labs, Consult reports and Test reports for all details in brief  HPI  from the history and physical done on the day of admission 11/15/2016  HPI: Jeffrey Trujillo is a 66 y.o. male with medical history significant of renal cell CA s/p nephrectomy with spread to the remaining kidney for which he is not being treated; iron deficiency anemia; stage 3 CKD; and an anal fistula presenting with AMS and severe pain - ?head and neck.   1-H/o neck problems for years.  Used to see orthopedics, Dr. Ace Gins; he had injections in neck and was on topamax.  The neck seems to be getting chronically worse, with pain into the back of his head over the last year.  Went to PCP and was given opiates and was also seen in the ER with neck pain on 10/29/16.   2- He also had a perirectal abscess with surgical repair on 11/13/15; [name] did the surgery initially but he was eventually sent to Dr. Marcello Moores at [facility] in [place].  He had another syurgery last year, had a deep abscess with a fistula and had a drain placed.  It took about 3 months to complete the drain removal.  He saw [name] in December and restarted the cancer medication (Cabometyx) in January.  Only took a few of them and then the incision on his bottom started acting up with leakage and pain with sitting.  About 2 weeks ago, he got up during the night and the small remaining wound was pouring  blood.  Bleeding stopped and no further issues until 3 days ago.  Had some brown leakage out of the lesion, but just a small spot visible on the skin.    3- He did also go to the medical center last week for dysuria.  Told he did not have an infection "but his urine was thick" - was told to drink more water, "lay off [place]", and if drinking gatorade then dilute it.  He started himself on a leftover cephalosporin he had at home and then was given pain medication and muscle relaxer.  No further complaints about the dysuria.    Yesterday, he began complaining of chronic severe headache and neck pain.  Also had urinary  incontinence x 3-4 days.  Yesterday, he was able to talk to his wife.  He could move around and walk.  Last night, he was up during the night with head/neck pain, urinary incontinence, yelling "help me, help me, I hurt, I hurt."  As of 10am today, he wasn't coherent.  Sitting on toilet, drooling, unable to communicate what was wrong. He has had simliar MS changes in the past but never this severe.  Current AMS started this AM.  +chills, no fevers.     ED Course:  Concern for infection based on lactate 5.5 but no symptoms to indicate possible source.  No external abnormality or abscess appreciated on rectal but patient did moan and grimace.  Imaging studies pending.   Hospital Course  66 y.o.malewith medical history significant of renal cell CA s/p nephrectomy with spread to the remaining kidney for which he is not being treated; iron deficiency anemia; stage 3 CKD; and an anal fistula presenting with AMS and severe Neck pain with headache     Acute encephalopathy - Patient with acute worsening mental status over last 48 hours, CT head with no acute findings but poor quality, MRI brain With no acute finding as well, multi cervical spine significant for cervical disc degeneration, most notable at C6-7 , New right facet arthrosis at C2-3 without stenosis and New/larger, shallow C7-T1  disc protrusion without stenosis. - Unclear etiology, thought initially secondary to the brain or brainstem metastasis, or cervical spine metastasis, but currently no evidence of metastasis on MRI. - Infectious Versus is less likely, but given his critical condition he was started empirically on ampicillin, vancomycin and Rocephin to cover for bacterial meningitis, but he is afebrile, with no leukocytosis, L L PICC be done by radiology on 3/3 at [facility], there are no white blood cells to be seen, has elevated glucose level, which goes against infectious process, as well Gram stain is negative , discussed with ID via phone, especially in the setting of no fever, leukocytosis, totally negative LP recommendation so antibiotic has been stopped 11/18/2016, and patient been anteverted for last 48 hours with no recurrence of fever or symptoms. -  IV steroids appears to have been helping, which make me think since this episode most likely related to cervical radiculopathy versus severe gouty flare (even though this is non-typical location for it ), will continue with Decadron taper.(For discharge on 6 days taper as an outpatient)  Leukocytosis -  with white blood cell count  13.9, Patient nontoxic appearing, afebrile, this is most likely related to steroids.  Cancer related pain - Continue with home medication on discharge  Renal cell cancer - Patient followed by Dr.Shadad, chemotherapy is currently on hold given rectal abscess.  Anemia of chronic disease - Continue to monitor closely  Hypertension - Blood pressure uncontrolled, started on amlodipine, will discharge on 10 mg oral daily.  CKD stage III - At baseline  Discharge Condition:  stable   Follow UP  [facility] Follow up in 1 week(s).   Contact information: [address] [place] [address] [phone]        Rosario Adie., MD Follow up in 2 week(s).   Specialty:   General Surgery Contact information: [address] [place] [address] [phone]        [facility], MD Follow up.   Specialty:  Oncology Why:  Keep your appointment for early April Contact information: [address]  [place] [address] [phone]             Discharge Instructions  and  Discharge Medications     Discharge Instructions    Discharge instructions    Complete by:  As directed    Follow with Primary [facility] in 7 days   Get CBC, CMP,  checked  by Primary MD next visit.    Activity: As tolerated with Full fall precautions use walker/cane & assistance as needed   Disposition Home    Diet: Heart Healthy  , with feeding assistance and aspiration precautions.   On your next visit with your primary care physician please Get Medicines reviewed and adjusted.   Please request your Prim.MD to go over all Hospital Tests and Procedure/Radiological results at the follow up, please get all Hospital records sent to your Prim MD by signing hospital release before you go home.   If you experience worsening of your admission symptoms, develop shortness of breath, life threatening emergency, suicidal or homicidal thoughts you must seek medical attention immediately by calling 911 or calling your MD immediately  if symptoms less severe.  You Must read complete instructions/literature along with all the possible adverse reactions/side effects for all the Medicines you take and that have been prescribed to you. Take any new Medicines after you have completely understood and accpet all the possible adverse reactions/side effects.   Do not drive, operating heavy machinery, perform activities at heights, swimming or participation in water activities or provide baby sitting services if your were admitted for syncope or siezures until you have seen by Primary MD or a Neurologist and advised to do so again.  Do  not drive when taking Pain medications.    Do not take more than prescribed Pain, Sleep and Anxiety Medications  Special Instructions: If you have smoked or chewed Tobacco  in the last 2 yrs please stop smoking, stop any regular Alcohol  and or any Recreational drug use.  Wear Seat belts while driving.   Please note  You were cared for by a hospitalist during your hospital stay. If you have any questions about your discharge medications or the care you received while you were in the hospital after you are discharged, you can call the unit and asked to speak with the hospitalist on call if the hospitalist that took care of you is not available. Once you are discharged, your primary care physician will handle any further medical issues. Please note that NO REFILLS for any discharge medications will be authorized once you are discharged, as it is imperative that you return to your primary care physician (or establish a relationship with a primary care physician if you do not have one) for your aftercare needs so that they can reassess your need for medications and monitor your lab values.   Increase activity slowly    Complete by:  As directed      Allergies as of 11/20/2016      Reactions   Amoxicillin Nausea And Vomiting   Nsaids Other (See Comments)   Patient only has one kidney.    Zofran [ondansetron Hcl] Hives   Demerol [meperidine] Rash      Medication List    STOP taking these medications   CABOMETYX 40 MG Tabs Generic drug:  cabozantinib S-Malate   diphenhydrAMINE 25 MG tablet Commonly known as:  BENADRYL     TAKE these medications   acetaminophen 650 MG CR tablet  Commonly known as:  TYLENOL Take 650 mg by mouth every 8 (eight) hours as needed for pain.   amLODipine 10 MG tablet Commonly known as:  NORVASC Take 1 tablet (10 mg total) by mouth daily. Start taking on:  11/21/2016   baclofen 20 MG tablet Commonly known as:  LIORESAL Take 20 mg by mouth 2 (two) times  daily.   dexamethasone 2 MG tablet Commonly known as:  DECADRON Please take 2 mg oral twice daily for 3 days, then 2 mg oral daily once a day for 3 days then stop   docusate sodium 100 MG capsule Commonly known as:  COLACE Take 1 capsule (100 mg total) by mouth every 12 (twelve) hours. What changed:  when to take this  reasons to take this   oxyCODONE-acetaminophen 5-325 MG tablet Commonly known as:  PERCOCET Take 1-2 tablets by mouth every 4 (four) hours as needed.   pantoprazole 40 MG tablet Commonly known as:  PROTONIX Take 1 tablet (40 mg total) by mouth 2 (two) times daily before a meal.   topiramate 25 MG tablet Commonly known as:  TOPAMAX Take 25 mg by mouth 2 (two) times daily.         Diet and Activity recommendation: See Discharge Instructions above   Consults obtained -  None   Major procedures and Radiology Reports - PLEASE review detailed and final reports for all details, in brief -     Ct Abdomen Pelvis Wo Contrast  Result Date: 11/15/2016 CLINICAL DATA:  Urinary urgency and incontinence. EXAM: CT ABDOMEN AND PELVIS WITHOUT CONTRAST TECHNIQUE: Multidetector CT imaging of the abdomen and pelvis was performed following the standard protocol without IV contrast. COMPARISON:  CT 08/31/2016. FINDINGS: Lower chest: Breathing motion artifact but no obvious pulmonary lesion or acute pulmonary abnormality. The heart is normal in size. No pericardial effusion. Hepatobiliary: No focal hepatic lesions or intrahepatic biliary dilatation. The gallbladder is grossly normal. Pancreas: Stable lesion in the pancreatic body measuring 20 x 18 mm on image number 28. No ductal dilatation. Spleen: Normal size.  No focal lesions. Adrenals/Urinary Tract: Solid left adrenal gland lesion measures 2 x 2 cm and previously measured 1.8 x 1.8 cm. The right adrenal gland is normal. Stable exophytic slightly complex left renal cyst. There is an enlarging solid upper pole Mass obliterating  the upper pole collecting system. Expanded and hyperdense left pulmonary vein consistent with tumor invasion. No hydronephrosis. No ureteral or bladder calculi. The right kidney is surgically absent. Stomach/Bowel: The stomach, duodenum, small bowel and colon are grossly normal. No acute inflammatory changes, mass lesions or obstructive findings. Colonic diverticulosis is noted. Vascular/Lymphatic: The aorta and branch vessels are grossly normal without contrast. No aneurysm. No mesenteric or retroperitoneal mass or adenopathy. Reproductive: Surgical changes from TURP. Seminal vesicles are grossly normal. Other: No pelvic mass or adenopathy. No free pelvic fluid collections. No inguinal mass or adenopathy. No abdominal wall hernia or subcutaneous lesions. Musculoskeletal: No significant bony findings. No lytic/destructive bone lesions are identified. IMPRESSION: 1. Enlarging left upper pole solid renal mass measuring approximately 5.6 cm with evidence of tumor invading the proximal renal vein which is hyperdense and distended. 2. Slight interval enlargement of a left adrenal gland lesion. 3. Slight interval enlargement of the pancreatic body lesion. 4. No findings for metastatic disease involving the liver, lung bases or bony structures. 5. No renal, ureteral or bladder calculi. Electronically Signed   By: Marijo Sanes M.D.   On: 11/15/2016 17:03   Ct  Head Wo Contrast  Result Date: 11/15/2016 CLINICAL DATA:  Altered mental status EXAM: CT HEAD WITHOUT CONTRAST TECHNIQUE: Contiguous axial images were obtained from the base of the skull through the vertex without intravenous contrast. COMPARISON:  None. FINDINGS: Brain: Image quality degraded by moderate motion. Repeat attempts were attempted but there was excessive motion on the repeat images. Mild atrophy. Negative for hydrocephalus. Negative for hemorrhage or mass. No acute infarct identified. Vascular: No hyperdense vessel or unexpected calcification. Skull:  Negative Sinuses/Orbits: Negative Other: None IMPRESSION: No acute abnormality.  Image quality degraded by moderate motion. Electronically Signed   By: Franchot Gallo M.D.   On: 11/15/2016 12:26   Mr Brain Wo Contrast  Result Date: 11/17/2016 CLINICAL DATA:  Headache and neck pain. EXAM: MRI HEAD WITHOUT CONTRAST TECHNIQUE: Multiplanar, multiecho pulse sequences of the brain and surrounding structures were obtained without intravenous contrast. COMPARISON:  Head CT 11/15/2016 FINDINGS: Brain: There is no evidence of acute infarct, intracranial hemorrhage, mass, midline shift, or extra-axial fluid collection. There is mild cerebral atrophy. Periventricular and scattered subcortical cerebral white matter T2 hyperintensities are nonspecific but compatible with mild chronic small vessel ischemic disease. Vascular: Major intracranial vascular flow voids are preserved. Skull and upper cervical spine: Unremarkable bone marrow signal. Sinuses/Orbits: Globes appear intact. Remote fracture involving the right maxillary sinus and inferior orbital rim. Minimal left greater than right ethmoid sinus mucosal thickening. Trace right mastoid fluid. Other: None. IMPRESSION: 1. No acute intracranial abnormality. 2. Mild chronic small vessel ischemic disease and cerebral atrophy. Electronically Signed   By: Logan Bores M.D.   On: 11/17/2016 11:42   Mr Cervical Spine Wo Contrast  Result Date: 11/17/2016 CLINICAL DATA:  Headache and neck pain. EXAM: MRI CERVICAL SPINE WITHOUT CONTRAST TECHNIQUE: Multiplanar, multisequence MR imaging of the cervical spine was performed. No intravenous contrast was administered. COMPARISON:  06/05/2011 FINDINGS: Despite the patient being pre-medicated, only 3 sequences could be obtained (sagittal T1, sagittal STIR, and axial gradient echo T2). These are mildly to moderately motion degraded. Alignment: Reversal the normal cervical lordosis. Trace anterolisthesis of C3 on C4. Vertebrae: No evidence of  fracture, suspicious osseous lesion, or significant marrow edema. Cord: Normal morphology. No definite signal abnormality identified within limitations of motion artifact. Posterior Fossa, vertebral arteries, paraspinal tissues: Unremarkable. Disc levels: C2-3: New moderate right facet arthrosis without significant stenosis. C3-4: Listhesis with mild disc uncovering and mild to moderate facet arthrosis without significant stenosis. C4-5: Moderate disc space narrowing. Right greater left uncovertebral spurring results in mild right neural foraminal stenosis, similar to prior. No spinal stenosis. C5-6: Uncovertebral spurring results in mild bilateral neural foraminal narrowing, likely unchanged. Diffuse osteophytic ridging posteriorly (versus disc bulging on the prior) without significant spinal stenosis. C6-7: Disc bulging and uncovertebral spurring result in mild spinal stenosis and mild-to-moderate left neural foraminal stenosis, not significantly changed. C7-T1: Shallow central disc protrusion, new or larger than on the prior study though without stenosis or spinal cord mass effect. IMPRESSION: 1. Incomplete, motion degraded examination. 2. Multilevel cervical disc degeneration, most notable at C6-7 where there is unchanged mild spinal stenosis and mild-to-moderate left foraminal stenosis. 3. New right facet arthrosis at C2-3 without stenosis. 4. New/larger, shallow C7-T1 disc protrusion without stenosis. Electronically Signed   By: Logan Bores M.D.   On: 11/17/2016 11:55   Dg Chest Port 1 View  Result Date: 11/15/2016 CLINICAL DATA:  Urinary tract infection with altered mental status. EXAM: PORTABLE CHEST 1 VIEW COMPARISON:  08/31/2016 chest CT, CXR 12/07/2015 FINDINGS:  The heart size and mediastinal contours are within normal limits. Chronic blunting of the left costophrenic angle with postoperative deformity of the left posterior sixth rib in this patient who is reportedly status post partial pneumonectomy.  Mild central vascular congestion. No pneumothorax or pneumonic consolidation. No acute nor worrisome osseous abnormalities. IMPRESSION: Postsurgical change of the left hemithorax. Central vascular congestion suggestive of mild CHF. Electronically Signed   By: Ashley Royalty M.D.   On: 11/15/2016 13:46   Dg Fluoro Guide Lumbar Puncture  Result Date: 11/17/2016 Michiel Cowboy, MD     11/17/2016  3:50 PM Successful lumbar puncture. 10 cc clear CSF removed and sent to the lab.  Patient tolerated procedure well with no apparent complications.   Micro Results    Recent Results (from the past 240 hour(s))  Blood Culture (routine x 2)     Status: None   Collection Time: 11/15/16  1:33 PM  Result Value Ref Range Status   Specimen Description RIGHT ANTECUBITAL  Final   Special Requests BOTTLES DRAWN AEROBIC AND ANAEROBIC 10CC EACH  Final   Culture NO GROWTH 5 DAYS  Final   Report Status 11/20/2016 FINAL  Final  Blood Culture (routine x 2)     Status: None   Collection Time: 11/15/16  1:37 PM  Result Value Ref Range Status   Specimen Description LEFT ANTECUBITAL  Final   Special Requests BOTTLES DRAWN AEROBIC ONLY 10 CC AEB  Final   Culture NO GROWTH 5 DAYS  Final   Report Status 11/20/2016 FINAL  Final  Urine culture     Status: Abnormal   Collection Time: 11/15/16  1:50 PM  Result Value Ref Range Status   Specimen Description URINE, CATHETERIZED  Final   Special Requests NONE  Final   Culture (A)  Final    <10,000 COLONIES/mL INSIGNIFICANT GROWTH Performed at [facility], [place] [address], [place], [place] [address]    Report Status 11/17/2016 FINAL  Final  MRSA PCR Screening     Status: None   Collection Time: 11/15/16  8:02 PM  Result Value Ref Range Status   MRSA by PCR NEGATIVE NEGATIVE Final    Comment:        The GeneXpert MRSA Assay (FDA approved for NASAL specimens only), is one component of a comprehensive MRSA colonization surveillance program. It is not intended to  diagnose MRSA infection nor to guide or monitor treatment for MRSA infections.   Anaerobic culture     Status: None (Preliminary result)   Collection Time: 11/17/16  4:09 PM  Result Value Ref Range Status   Specimen Description CSF  Final   Special Requests NONE  Final   Culture   Final    NO ANAEROBES ISOLATED; CULTURE IN PROGRESS FOR 5 DAYS   Report Status PENDING  Incomplete  CSF culture     Status: None (Preliminary result)   Collection Time: 11/17/16  4:09 PM  Result Value Ref Range Status   Specimen Description CSF  Final   Special Requests NONE  Final   Gram Stain NO WBC SEEN NO ORGANISMS SEEN   Final   Culture   Final    NO GROWTH 2 DAYS Performed at [facility], [place] [address], [place],  [address]    Report Status PENDING  Incomplete  Culture, fungus without smear     Status: None (Preliminary result)   Collection Time: 11/17/16  4:09 PM  Result Value Ref Range Status  Specimen Description CSF  Final   Special Requests NONE  Final   Culture   Final    NO FUNGUS ISOLATED AFTER 2 DAYS Performed at [facility], [address], [place], [place] [address]    Report Status PENDING  Incomplete       Today   Subjective:   Jeffrey Trujillo today has no headache,no chest or  abdominal pain,no new weakness tingling or numbness, feels much better wants to go home today. Reports mild accretion from his and rectal fistula blood tinged secretions.  Objective:   Blood pressure 140/64, pulse 60, temperature 97.5 F (36.4 C), temperature source Oral, resp. rate 20, height 5\' 11"  (1.803 m), weight 80.1 kg (176 lb 9.4 oz), SpO2 100 %.   Intake/Output Summary (Last 24 hours) at 11/20/16 1038 Last data filed at 11/20/16 0900  Gross per 24 hour  Intake          1591.67 ml  Output              100 ml  Net          1491.67 ml    Exam Awake Alert, Oriented x 3 Supple Neck,No JVD,  Symmetrical Chest wall movement, Good air movement bilaterally,  CTAB RRR,No Gallops,Rubs or new Murmurs, No Parasternal Heave +ve B.Sounds, Abd Soft, Non tender, No rebound -guarding or rigidity. No Cyanosis, Clubbing or edema, No new Rash or bruise, rectal  exam with no evidence of fluid collection erythema or abscess .   Data Review   CBC w Diff: Lab Results  Component Value Date   WBC DUPLICATE REQUEST 123XX123   HGB DUPLICATE REQUEST [id number]   HGB 9.1 (L) 0000000   HCT DUPLICATE REQUEST [id number]   HCT 28.8 (L) 0000000   PLT DUPLICATE REQUEST [id number]   PLT 152 11/09/2016   LYMPHOPCT 1 11/19/2016   LYMPHOPCT 9.0 (L) 11/09/2016   MONOPCT 2 11/19/2016   MONOPCT 8.5 11/09/2016   EOSPCT 0 11/19/2016   EOSPCT 0.1 11/09/2016   BASOPCT 0 11/19/2016   BASOPCT 0.0 11/09/2016    CMP: Lab Results  Component Value Date   NA 137 11/20/2016   NA 144 11/09/2016   K 3.6 11/20/2016   K 4.2 11/09/2016   CL 104 11/20/2016   CL 104 03/05/2013   CO2 26 11/20/2016   CO2 28 11/09/2016   BUN 31 (H) 11/20/2016   BUN 23.0 11/09/2016   CREATININE 1.37 (H) 11/20/2016   CREATININE 1.5 (H) 11/09/2016   PROT 7.0 11/15/2016   PROT 7.4 11/09/2016   ALBUMIN 3.4 (L) 11/15/2016   ALBUMIN 3.5 11/09/2016   BILITOT 1.0 11/15/2016   BILITOT 0.46 11/09/2016   ALKPHOS 56 11/15/2016   ALKPHOS 80 11/09/2016   AST 16 11/15/2016   AST 19 11/09/2016   ALT 16 (L) 11/15/2016   ALT 17 11/09/2016  .   Total Time in preparing paper work, data evaluation and todays exam - 35 minutes  ELGERGAWY, DAWOOD M.D on 11/20/2016 at 10:38 AM  [facility]   Office  [phone]

## 2016-11-20 NOTE — Progress Notes (Signed)
Daily Progress Note   Patient Name: Jeffrey Trujillo       Date: 11/20/2016 DOB: 1951/02/09  Age: 66 y.o. MRN#: [id number] Attending Physician: No att. providers found Primary Care Physician: [place] Date: 11/15/2016  Reason for Consultation/Follow-up: Establishing goals of care and Psychosocial/spiritual support  Subjective: Jeffrey Trujillo is sitting quietly in bed with his eyes closed. He wakes easily when I call his name. He makes eye contact saying that he feels much improved, but he was very worried about how sick he was. Jeffrey Trujillo shares that hospitalist feels that his problems related to severe degenerative joint disease, neuropathy related to chronic ongoing neck pain. 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 Jeffrey Trujillo states that he will follow up with his surgeon outpatient, and his oncologist during an April appointment. He states that there's no questions or concerns at this time. He knows he is been discharge, is agreeable to follow-up planning, sister [name] is coming to take him home soon.  Length of Stay: 5  Current Medications: Scheduled Meds:  . amLODipine  10 mg Oral Daily  . dexamethasone  4 mg Oral Q12H  . docusate sodium  100 mg Oral Q12H  . pantoprazole  40 mg Oral BID  . saccharomyces boulardii  250 mg Oral BID  . sodium chloride flush  3 mL Intravenous Q12H  . topiramate  25 mg Oral BID    Continuous Infusions: . sodium chloride 100 mL/hr at 11/20/16 0616    PRN Meds: acetaminophen **OR** acetaminophen, hydrALAZINE, oxyCODONE-acetaminophen  Physical Exam  Constitutional: He is oriented to person, place, and time. No distress.  Calm and cooperative, makes eye  contact  HENT:  Head: Normocephalic and atraumatic.  Cardiovascular: Regular rhythm.   Pulmonary/Chest: Effort normal. No respiratory distress.  Abdominal: Soft. He exhibits no distension.  Musculoskeletal: He exhibits no edema.  Neurological: He is alert and oriented to person, place, and time.  Skin: Skin is warm and dry.  Nursing note and vitals reviewed.           Vital Signs: BP 140/64 (BP Location: Left Arm)   Pulse 60   Temp 97.5 F (36.4 C) (Oral)   Resp 20   Ht 5'  11" (1.803 m)   Wt 80.1 kg (176 lb 9.4 oz)   SpO2 100%   BMI 24.63 kg/m  SpO2: SpO2: 100 % O2 Device: O2 Device: Not Delivered O2 Flow Rate: O2 Flow Rate (L/min): 4 L/min  Intake/output summary:  Intake/Output Summary (Last 24 hours) at 11/20/16 1254 Last data filed at 11/20/16 0900  Gross per 24 hour  Intake          1111.67 ml  Output              100 ml  Net          1011.67 ml   LBM: Last BM Date: 11/20/16 Baseline Weight: Weight: 79.4 kg (175 lb) Most recent weight: Weight: 80.1 kg (176 lb 9.4 oz)       Palliative Assessment/Data:    Flowsheet Rows   Flowsheet Row Most Recent Value  Intake Tab  Referral Department  Hospitalist  Unit at Time of Referral  ICU  Palliative Care Primary Diagnosis  Cancer  Date Notified  11/16/16  Palliative Care Type  New Palliative care  Reason for referral  Clarify Goals of Care  Date of Admission  11/15/16  Date first seen by Palliative Care  11/16/16  # of days Palliative referral response time  0 Day(s)  # of days IP prior to Palliative referral  1  Clinical Assessment  Palliative Performance Scale Score  20%  Pain Max last 24 hours  Not able to report  Pain Min Last 24 hours  Not able to report  Dyspnea Max Last 24 Hours  Not able to report  Dyspnea Min Last 24 hours  Not able to report  Psychosocial & Spiritual Assessment  Palliative Care Outcomes  Patient/Family meeting held?  Yes [Sister and brother-in-law at bedside]  Who was at the  meeting?  Sister and brother-in-law at bedside  [place]  Provided psychosocial or spiritual support  Patient/Family wishes: Interventions discontinued/not started   Mechanical Ventilation      Patient Active Problem List   Diagnosis Date Noted  . Chronic neck pain   . Goals of care, counseling/discussion   . Palliative care encounter   . Encephalopathy acute 11/15/2016  . Fever 12/07/2015  . Gouty arthritis 12/07/2015  . Fall   . Pyrexia   . Acute gout 11/15/2015  . Perirectal abscess 11/12/2015  . Hypokalemia 11/12/2015  . Anemia of chronic disease 11/12/2015  . Gallstones   . Sinus bradycardia 02/14/2015  . Malnutrition of moderate degree ([place]) 02/12/2015  . Elevated LFTs 02/12/2015  . Abdominal pain, right upper quadrant 02/12/2015  . Acute kidney injury ([place]) 02/12/2015  . Cholecystitis 02/11/2015  . Hiatal hernia   . Chest pain 04/15/2014  . PUD (peptic ulcer disease) 04/21/2013  . Melena 03/10/2013  . Abdominal pain 03/10/2013  . Leukopenia 03/10/2013  . Thrombocytopenia ([place]) 03/10/2013  . Abdominal abscess ([place]) 03/10/2013  . Anemia 03/10/2013  . ETOH abuse 03/10/2013  . Bradycardia 03/10/2013  . Barrett's esophagus 01/04/2013  . Renal cell cancer ([place]) 09/25/2012  . Lower GI bleed 09/25/2012  . Anemia due to blood loss 09/25/2012  . Acute gout of right foot 09/25/2012  . DJD (degenerative joint disease) 09/25/2012  . Pancreatic mass 08/04/2012  . Acute strain of neck muscle 06/05/2011  . RECTAL BLEEDING 10/20/2009  . NAUSEA WITH VOMITING 10/20/2009  . DIARRHEA, BLOODY 10/20/2009    Palliative Care Assessment & Plan   Patient Profile: Jeffrey Trujillo is a 66 year old male with  a medical history significant for renal cell cancer status post nephrectomy with spread to the remaining kidney. He is not being treated for the cancer per Dr. should died in September 20, 2023 he took a few treatments but ended up with problems from a known anal fistula abscess  that had surgical repair February 2017 the required drain placement with complex wound healing. He also has history of iron deficiency anemia and stage IIICKD, Barrett's esophagus, diverticulosis of colon, GER D, peptic ulcer disease, gouty arthritis, DJD admitted on 11/15/16 for acute encephalopathy.  Assessment: History of chronic worsening neck pain:hx of pain management clinic with injections, opiates from PCP, seen in ED for neck pain 10/29/16. MRI of brain when patient is able to cooperate.  LP negative, no acute cerebral process. Encephalopathy improved. Acute encephalopathy:support for pan management, fluid, and antibiotics. MRI of brain when patient is able to cooperate. LP negative, no acute cerebral process. Encephalopathy improved.   Recommendations/Plan:  continue to treat the treatable, follow up appointment with Oncology, [name] scheduled for mid April.  I encourage patient to make [name] aware and ask if he needs to be seen sooner. Patient states he will also see his surgeon regarding his perirectal abscess.  Goals of Care and Additional Recommendations:  Limitations on Scope of Treatment: Treat the treatable but no extraordinary measures such as CPR or intubation. Jeffrey Trujillo to is agreeable to continue with cancer treatments as long as possible.  Code Status:    Code Status Orders        Start     Ordered   11/15/16 1956  Do not attempt resuscitation (DNR)  Continuous    Question Answer Comment  In the event of cardiac or respiratory ARREST Do not call a "code blue"   In the event of cardiac or respiratory ARREST Do not perform Intubation, CPR, defibrillation or ACLS   In the event of cardiac or respiratory ARREST Use medication by any route, position, wound care, and other measures to relive pain and suffering. May use oxygen, suction and manual treatment of airway obstruction as needed for comfort.      11/15/16 1955    Code Status History    Date Active  Date Inactive Code Status Order ID Comments User Context   12/07/2015 10:11 PM 12/09/2015  5:07 PM Full Code [id number]  Phillips Grout, MD Inpatient   11/12/2015  6:38 PM 11/18/2015  9:02 PM Full Code [id number]  Erline Hau, MD Inpatient   02/11/2015 11:37 PM 02/15/2015  3:56 PM Full Code [id number]  Truett Mainland, DO Inpatient   04/14/2014  7:04 PM 04/15/2014 11:15 PM Full Code [id number]  Doree Albee, MD ED   03/10/2013 11:51 AM 03/12/2013  5:41 PM Full Code [id number]  Radene Gunning, NP ED   09/25/2012  7:32 AM 09/30/2012  8:07 PM Full Code [id number]  Yehuda Budd, RN Inpatient       Prognosis:   Unable to determine based on outcomes.  Discharge Planning:  .No needs identified at this time. Return home with the support of spouse.  Care plan was discussed with nursing staff, case manager, social worker, and Dr. Waldron Labs.  Thank you for allowing the Palliative Medicine Team to assist in the care of this patient.   Time In: 1105 Time Out: 1120 Total Time 15 minutes  Prolonged Time Billed  no       Greater than 50%  of this time was spent counseling and coordinating  care related to the above assessment and plan.  Drue Novel, NP  Please contact Palliative Medicine Team phone at [phone] for questions and concerns.

## 2016-11-20 NOTE — Care Management Important Message (Signed)
Important Message  Patient Details  Name: ELREE BOENING MRN: [id number] Date of Birth: 1950/11/11   Medicare Important Message Given:  Yes    Sherald Barge, RN 11/20/2016, 11:56 AM

## 2016-11-20 NOTE — Progress Notes (Signed)
Patient states understanding of discharge instructions, prescription given. 

## 2016-11-22 LAB — ANAEROBIC CULTURE

## 2016-11-29 ENCOUNTER — Ambulatory Visit (INDEPENDENT_AMBULATORY_CARE_PROVIDER_SITE_OTHER): Payer: Medicare Other | Admitting: Orthopaedic Surgery

## 2016-11-29 ENCOUNTER — Encounter: Payer: Self-pay | Admitting: Orthopaedic Surgery

## 2016-11-29 VITALS — BP 150/84 | HR 80 | Ht 69.0 in | Wt 177.0 lb

## 2016-11-29 DIAGNOSIS — M542 Cervicalgia: Secondary | ICD-10-CM | POA: Diagnosis not present

## 2016-11-29 NOTE — Progress Notes (Signed)
Subjective:    Patient ID: Jeffrey Trujillo, male    DOB: 1950-12-08, 66 y.o.   MRN: [id number]  HPI He has long history of neck pain. He has had been seen by Dr. Ace Gins who used to be here in town and had injection into the neck area.  He was just discharged from [facility] for treatment of UTI which caused confusion and neck pain.  He had a MRI of the neck showing: IMPRESSION: 1. Incomplete, motion degraded examination. 2. Multilevel cervical disc degeneration, most notable at C6-7 where there is unchanged mild spinal stenosis and mild-to-moderate left foraminal stenosis. 3. New right facet arthrosis at C2-3 without stenosis. 4. New/larger, shallow C7-T1 disc protrusion without stenosis.  He has continued pain in the neck more when turning to the right.  He has no numbness, no weakness.  He says it just hurts all the time.   He has not been to PT.  I will arrange PT and have him seen by Dr. Maryjean Ka in [place] for consideration of neck blocks.   Review of Systems  HENT: Negative for congestion.   Respiratory: Negative for cough and shortness of breath.   Cardiovascular: Negative for chest pain and leg swelling.  Endocrine: Positive for cold intolerance.  Genitourinary: Positive for difficulty urinating, dysuria and frequency.  Musculoskeletal: Positive for arthralgias, myalgias, neck pain and neck stiffness.  Allergic/Immunologic: Positive for environmental allergies.  Neurological:       Confusion recently secondary to UTI.  Resolved now.    Past Medical History:  Diagnosis Date  . Anal fistula   . Anemia in chronic renal disease   . Barrett's esophagus    by EGD 11/02/2009  . Chronic pain   . CKD (chronic kidney disease), stage III   . Diverticulosis of colon    by colonoscopy 11/02/2009  . DJD (degenerative joint disease) of cervical spine   . GERD (gastroesophageal reflux disease)   . Gouty arthritis    per pt on 05-16-2016 stable (last bout bilatearl wrist 03/  2017)  . History of diverticulitis of colon    w/ small perforation , no surgical intervention 06/ 2014  . History of peptic ulcer disease   . Iron deficiency anemia due to chronic blood loss   . 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 Nocturia   . Thrombocytopenia ([place])    secondary to oral chemo  . Wears glasses     Past Surgical History:  Procedure Laterality Date  . COLONOSCOPY  Feb 2011   [name]: single anal papilla, pan-colonic diverticula  . COLONOSCOPY  09/25/2012   RMR: Colonic diverticulosis. Suspect diverticular bleeding-hepatic flexure tic-sealed  . ESOPHAGOGASTRODUODENOSCOPY  Feb 2011   [name]: salmon-colored epithelium consistent with Barrett's, nodular antral erosions, small hiatal hernia, negative H.pylori  . ESOPHAGOGASTRODUODENOSCOPY  09/25/2012   HBZ:JIRCVEL'F esophagus. Small hiatal hernia. Antral erosions of doubtful clinical significance, no biopsies  . ESOPHAGOGASTRODUODENOSCOPY N/A 03/11/2013   SLF: Barrett's esophagus/Single ulcer in the gastric antrum-PROBABLE SOURCE FOR BLACK STOOL/ Chronic gastritis in the gastric antrum  . ESOPHAGOGASTRODUODENOSCOPY N/A 10/11/2014   Dr. [name] barrett's s/p biopsy. Hiatal hernia. Biopsy with intestinal metaplasia, glandular atypia indefinite for  low grade dysplasia noted as well  . ESOPHAGOGASTRODUODENOSCOPY N/A 01/19/2015   [name]: Barrett's no dysplasia  . EUS  08/07/2012   Procedure: UPPER ENDOSCOPIC ULTRASOUND (EUS) LINEAR;  Surgeon: [name], MD;  Location: [facility] ENDOSCOPY;  Service: Endoscopy;  Laterality: N/A;  . EUS  08/21/2012   Procedure: UPPER ENDOSCOPIC ULTRASOUND (EUS) LINEAR;  Surgeon: [name], MD;  Location: [facility]  ENDOSCOPY;  Service: Endoscopy;  Laterality: N/A;  . INCISION AND DRAINAGE ABSCESS N/A 11/13/2015   Procedure: INCISION AND DRAINAGE ABSCESS;  Surgeon: Aviva Signs, MD;  Location: [facility] ORS;  Service: General;  Laterality: N/A;  . INGUINAL HERNIA REPAIR Bilateral 1990's  . LAPAROSCOPIC NEPHRECTOMY Right 06/27/2009   dr Alinda Money  . LUNG REMOVAL, PARTIAL  1970   left lower lobe, benign    Current Outpatient Prescriptions on File Prior to Visit  Medication Sig Dispense Refill  . pantoprazole (PROTONIX) 40 MG tablet Take 1 tablet (40 mg total) by mouth 2 (two) times daily before a meal. 60 tablet 5  . topiramate (TOPAMAX) 25 MG tablet Take 25 mg by mouth 2 (two) times daily.    Marland Kitchen acetaminophen (TYLENOL) 650 MG CR tablet Take 650 mg by mouth every 8 (eight) hours as needed for pain.    Marland Kitchen amLODipine (NORVASC) 10 MG tablet Take 1 tablet (10 mg total) by mouth daily. 60 tablet 0  . baclofen (LIORESAL) 20 MG tablet Take 20 mg by mouth 2 (two) times daily.    Marland Kitchen dexamethasone (DECADRON) 2 MG tablet Please take 2 mg oral twice daily for 3 days, then 2 mg oral daily once a day for 3 days then stop 9 tablet 0  . docusate sodium (COLACE) 100 MG capsule Take 1 capsule (100 mg total) by mouth every 12 (twelve) hours. (Patient taking differently: Take 100 mg by mouth 2 (two) times daily as needed for mild constipation. ) 60 capsule 0  . oxyCODONE-acetaminophen (PERCOCET) 5-325 MG tablet Take 1-2 tablets by mouth every 4 (four) hours as needed. 20 tablet 0   No current facility-administered medications on file prior to visit.     Social History   Social History  . Marital status: Married    Spouse name: N/A  . Number of children: N/A  . Years of education: N/A   Occupational History  . retired [employer], [employer] [place] in [place] History Main Topics  . Smoking status: Former Smoker    Packs/day: 0.25    Years: 4.00    Types: Cigars    Quit date: 05/16/2014  . Smokeless  tobacco: Current User    Types: Chew     Comment: occasional cigar  . Alcohol use 2.4 - 3.0 oz/week    2 - 3 Cans of beer, 2 Shots of liquor per week     Comment: binges intermittently, 1/5 vodka daily until it makes him sick, last ETOH was about 3 months ago  . Drug use: No  . Sexual activity: Yes   Other Topics Concern  . Not on file   Social History Narrative  . No narrative on file    Family History  Problem Relation Age of Onset  . Lung cancer Brother   . Prostate cancer Brother   . Lumbar disc disease Brother   . Prostate cancer Brother   . Colon cancer Neg Hx     BP (!) 150/84   Pulse 80   Ht 5\' 9"  (1.753  m)   Wt 177 lb (80.3 kg)   BMI 26.14 kg/m       Objective:   Physical Exam  Constitutional: He is oriented to person, place, and time. He appears well-developed and well-nourished.  HENT:  Head: Normocephalic and atraumatic.  Eyes: Conjunctivae and EOM are normal. Pupils are equal, round, and reactive to light.  Neck: Normal range of motion. Neck supple.  Cardiovascular: Normal rate, regular rhythm and intact distal pulses.   Pulmonary/Chest: Effort normal.  Abdominal: Soft.  Musculoskeletal: He exhibits tenderness (His neck is tender more posteriorly and on the right with pain when trying to move neck to right.  Other motion is not tender. NV intact.).  Neurological: He is alert and oriented to person, place, and time. He has normal reflexes. He displays normal reflexes. No cranial nerve deficit. He exhibits normal muscle tone. Coordination normal.  Skin: Skin is warm and dry.  Psychiatric: He has a normal mood and affect. His behavior is normal. Judgment and thought content normal.  Vitals reviewed.         Assessment & Plan:   Encounter Diagnosis  Name Primary?  . Cervicalgia Yes   Begin PT.  See Dr. Maryjean Ka for possible cervical injections.  Return in two weeks.  Continue present medicine.  Call if any problem.  Electronically  Signed Sanjuana Kava, MD 3/15/20189:26 AM

## 2016-12-03 ENCOUNTER — Telehealth: Payer: Self-pay | Admitting: Orthopaedic Surgery

## 2016-12-03 NOTE — Telephone Encounter (Signed)
Kathlee Nations, from [facility], ph# [phone], called and left voice message - relays patient refused therapy at this time. Mentioned going to [facility] for injections.

## 2016-12-04 NOTE — Telephone Encounter (Signed)
Noted. Dr. Luna Glasgow referred him to Dr. Maryjean Ka on his last visit.

## 2016-12-08 LAB — CULTURE, FUNGUS WITHOUT SMEAR

## 2016-12-21 ENCOUNTER — Telehealth: Payer: Self-pay | Admitting: Oncology

## 2016-12-21 ENCOUNTER — Other Ambulatory Visit (HOSPITAL_BASED_OUTPATIENT_CLINIC_OR_DEPARTMENT_OTHER): Payer: Medicare Other

## 2016-12-21 ENCOUNTER — Ambulatory Visit (HOSPITAL_BASED_OUTPATIENT_CLINIC_OR_DEPARTMENT_OTHER): Payer: Medicare Other | Admitting: Oncology

## 2016-12-21 VITALS — BP 132/66 | HR 74 | Temp 98.2°F | Resp 18 | Ht 69.0 in | Wt 180.9 lb

## 2016-12-21 DIAGNOSIS — C642 Malignant neoplasm of left kidney, except renal pelvis: Secondary | ICD-10-CM | POA: Diagnosis present

## 2016-12-21 DIAGNOSIS — N289 Disorder of kidney and ureter, unspecified: Secondary | ICD-10-CM | POA: Diagnosis not present

## 2016-12-21 DIAGNOSIS — D696 Thrombocytopenia, unspecified: Secondary | ICD-10-CM | POA: Diagnosis not present

## 2016-12-21 DIAGNOSIS — K610 Anal abscess: Secondary | ICD-10-CM

## 2016-12-21 DIAGNOSIS — D509 Iron deficiency anemia, unspecified: Secondary | ICD-10-CM | POA: Diagnosis not present

## 2016-12-21 DIAGNOSIS — C7889 Secondary malignant neoplasm of other digestive organs: Secondary | ICD-10-CM | POA: Diagnosis not present

## 2016-12-21 DIAGNOSIS — C649 Malignant neoplasm of unspecified kidney, except renal pelvis: Secondary | ICD-10-CM

## 2016-12-21 LAB — CBC WITH DIFFERENTIAL/PLATELET
BASO%: 0.6 % (ref 0.0–2.0)
BASOS ABS: 0 10*3/uL (ref 0.0–0.1)
EOS%: 2 % (ref 0.0–7.0)
Eosinophils Absolute: 0.1 10*3/uL (ref 0.0–0.5)
HEMATOCRIT: 29.8 % — AB (ref 38.4–49.9)
HGB: 9.9 g/dL — ABNORMAL LOW (ref 13.0–17.1)
LYMPH%: 11.7 % — ABNORMAL LOW (ref 14.0–49.0)
MCH: 28.2 pg (ref 27.2–33.4)
MCHC: 33.1 g/dL (ref 32.0–36.0)
MCV: 85.2 fL (ref 79.3–98.0)
MONO#: 0.5 10*3/uL (ref 0.1–0.9)
MONO%: 7.6 % (ref 0.0–14.0)
NEUT#: 4.7 10*3/uL (ref 1.5–6.5)
NEUT%: 78.1 % — AB (ref 39.0–75.0)
PLATELETS: 250 10*3/uL (ref 140–400)
RBC: 3.5 10*6/uL — ABNORMAL LOW (ref 4.20–5.82)
RDW: 18.6 % — ABNORMAL HIGH (ref 11.0–14.6)
WBC: 6.1 10*3/uL (ref 4.0–10.3)
lymph#: 0.7 10*3/uL — ABNORMAL LOW (ref 0.9–3.3)

## 2016-12-21 LAB — COMPREHENSIVE METABOLIC PANEL
ALT: 10 U/L (ref 0–55)
ANION GAP: 7 meq/L (ref 3–11)
AST: 15 U/L (ref 5–34)
Albumin: 3.4 g/dL — ABNORMAL LOW (ref 3.5–5.0)
Alkaline Phosphatase: 103 U/L (ref 40–150)
BILIRUBIN TOTAL: 0.45 mg/dL (ref 0.20–1.20)
BUN: 17.4 mg/dL (ref 7.0–26.0)
CO2: 26 mEq/L (ref 22–29)
Calcium: 9.5 mg/dL (ref 8.4–10.4)
Chloride: 109 mEq/L (ref 98–109)
Creatinine: 1.5 mg/dL — ABNORMAL HIGH (ref 0.7–1.3)
EGFR: 48 mL/min/{1.73_m2} — AB (ref >90)
GLUCOSE: 111 mg/dL (ref 70–140)
Potassium: 4.4 mEq/L (ref 3.5–5.1)
Sodium: 143 mEq/L (ref 136–145)
Total Protein: 7.3 g/dL (ref 6.4–8.3)

## 2016-12-21 NOTE — Telephone Encounter (Signed)
Appointments scheduled per 4.6.18 LOS. Patient given AVS report and calendars with future scheduled appointments. °

## 2016-12-21 NOTE — Progress Notes (Signed)
Hematology and Oncology Follow Up Visit  Jeffrey Trujillo [id number] 12/21/16     Principle Diagnosis: 66 year old with the following issues:  1. T1b right kidney cancer diagnosed in 2010. He is S/P rdical nephrectomy on June 27, 2009. The pathological staging was T1b clear cell histology with Fuhrman grade 3/4.  2. Left Kidney mass  Measuring 4.5 x 2 7 x 3.6 irregular enhancing appears to be a new kidney cancer noted in 07/2012. 3. Pancreatic lesion. 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 4. Votrient  800 mg daily started in December 2013. Treatment held in 11/2012 due to increase LFTs. Votrient was resumed in April 2014 at 400 mg daily. His dose was reduced further to 200 mg daily starting in September 2016. Therapy discontinued in June 2017 because of progression of disease.   Current therapy: Cabometyx 40 mg daily started in July 2017. Therapy has been on hold since August of 2017 because of perianal abscess. Therapy was resumed temporarily in December 2017 but discontinued because of relapse in his abscess.  Therapy to be resumed on 12/22/2016.  Interim History: Mr. Kokesh presents for a follow up visit. Since his last visit, he was hospitalized in March 2018 for acute mental status changes and increase in his neck pain. His hospitalization and workup did not reveal any clear-cut etiology of this consideration for possible sepsis and bacterial meningitis. His symptoms have improved rapidly after antibiotics and steroid therapy. He was discharged home on dexamethasone tapering doses.  Since his discharge, he reports feeling reasonably well and have resumed most activities of daily living. He continues to have chronic neck pain which is unchanged. He also have reported lower extremity edema related to his steroid taper. He still ambulating without any difficulties. His appetite is excellent and have gained more weight since last visit.  He is not having any fevers or  chills. He did not report any headaches blurred vision double vision. Does not report any motor sensory neuropathy or alteration of mental status. He does not report any depression or anxiety. He does not report any frequency urgency or hesitancy. He does not report any other urinary symptoms. Remainder of his review of systems is unremarkable.  Medications: Unchanged by my review today. Current Outpatient Prescriptions  Medication Sig Dispense Refill  . acetaminophen (TYLENOL) 650 MG CR tablet Take 650 mg by mouth every 8 (eight) hours as needed for pain.    . pantoprazole (PROTONIX) 40 MG tablet Take 1 tablet (40 mg total) by mouth 2 (two) times daily before a meal. 60 tablet 5  . topiramate (TOPAMAX) 25 MG tablet Take 25 mg by mouth 2 (two) times daily.    . baclofen (LIORESAL) 20 MG tablet Take 20 mg by mouth 2 (two) times daily.    . colchicine 0.6 MG tablet Take 0.6 mg by mouth daily.    Marland Kitchen docusate sodium (COLACE) 100 MG capsule Take 1 capsule (100 mg total) by mouth every 12 (twelve) hours. (Patient not taking: Reported on 12/21/2016) 60 capsule 0   No current facility-administered medications for this visit.      Allergies:  Allergies  Allergen Reactions  . Nsaids Other (See Comments)    Patient only has one kidney.   . Meperidine Hcl Hives and Rash    Past Medical History, Surgical history, Social history, and Family History were reviewed and updated.   Physical Exam: Blood pressure 132/66, pulse 74, temperature 98.2 F (36.8 C), temperature source Oral, resp.  rate 18, height 5\' 9"  (1.753 m), weight 180 lb 14.4 oz (82.1 kg), SpO2 100 %. ECOG: 1 General appearance: Alert, awake gentleman without distress. Head: No oral ulcers or lesions.  Neck: no adenopathy, no masses Lymph nodes: Cervical, supraclavicular, and axillary nodes normal. Heart:regular rate and rhythm, S1, S2.  Lung:chest clear, no wheezing, or dullness to percussion. Abdomen: soft, non-tender, without masses  or organomegaly. No shifting dullness or ascites. EXT: 1+ edema noted bilaterally. Neurological exam: no motor or sensory deficits noted. Skin: No rashes or lesions noted. no petechiae or ecchymosis.    CBC    Component Value Date/Time   WBC 6.1 16/38/4665 9935   WBC DUPLICATE REQUEST 70/17/7939 1130   RBC 3.50 (L) 03/00/9233 0076   RBC DUPLICATE REQUEST 22/63/3354 1130   HGB 9.9 (L) 12/21/2016 0952   HCT 29.8 (L) 12/21/2016 0952   PLT 250 12/21/2016 0952   MCV 85.2 12/21/2016 0952   MCH 28.2 56/25/6389 3734   MCH DUPLICATE REQUEST 28/76/8115 1130   MCHC 33.1 72/62/0355 9741   MCHC DUPLICATE REQUEST 63/84/5364 1130   RDW 18.6 (H) 12/21/2016 0952   LYMPHSABS 0.7 (L) 12/21/2016 0952   MONOABS 0.5 12/21/2016 0952   EOSABS 0.1 12/21/2016 0952   BASOSABS 0.0 12/21/2016 0952       Impression and Plan:   66 year old gentleman with the following issues:   1. A history of renal cell carcinoma. He had stage T1b diagnosed in October 2010 of the right kidney, status post right nephrectomy. He also has left kidney cancer and pancreatic metastasis. He was initially started on Votrient in December 2013. He had dose reduction on few occasions. The patient currently on Votrient 200 mg daily since September 2016 dose reduction.  CT scan obtained on 02/26/2016 showed slow progression of disease especially in his left renal mass.   He is currently on Cabometyx which has been on hold due to his anal abscess.   Since his anal abscess has recovered fully at this time I have recommended restarting this medication at 40 mg daily starting on 12/22/2016. Complications associated with this medication were reviewed again including diarrhea, hand-foot syndrome and weight loss. He is agreeable to continue at this time. We will evaluate him in one month for a possible complications.   2. Pancreatic lesion. Biopsy proven to be malignant and likely represent renal cell cancer. Slightly increased based on  his last CT scan.  3. Thrombocytopenia: Platelet is back within normal range.  4. Anemia.  Related to iron deficiency as well as renal insufficiency. Hemoglobin is improving.  5. Increase LFTs:  We will continue to monitor this moving forward. His most recent LFTs were within normal range.  6. Edema: Related to steroid therapy which appears to be improving. I recommended continued elevation in his feet during daytime.  7. Perirectal abscess: Ccontinues to follow with Dr. Marcello Moores. Appears to have resolved at this time.  8. Follow-up:  In 4-6 weeks to follow his progress.  [name] MD 12/21/16

## 2017-01-18 ENCOUNTER — Telehealth: Payer: Self-pay | Admitting: Oncology

## 2017-01-18 NOTE — Telephone Encounter (Signed)
Mailed pt records to [facility] in [place], [place]

## 2017-01-25 ENCOUNTER — Ambulatory Visit: Payer: Medicare Other | Admitting: Oncology

## 2017-01-25 ENCOUNTER — Other Ambulatory Visit: Payer: Medicare Other

## 2017-01-31 ENCOUNTER — Ambulatory Visit (HOSPITAL_BASED_OUTPATIENT_CLINIC_OR_DEPARTMENT_OTHER): Payer: Medicare Other | Admitting: Oncology

## 2017-01-31 ENCOUNTER — Other Ambulatory Visit (HOSPITAL_BASED_OUTPATIENT_CLINIC_OR_DEPARTMENT_OTHER): Payer: Medicare Other

## 2017-01-31 ENCOUNTER — Telehealth: Payer: Self-pay | Admitting: Oncology

## 2017-01-31 ENCOUNTER — Other Ambulatory Visit: Payer: Self-pay | Admitting: *Deleted

## 2017-01-31 VITALS — BP 168/75 | HR 67 | Temp 98.3°F | Resp 17 | Ht 69.0 in | Wt 181.6 lb

## 2017-01-31 DIAGNOSIS — D509 Iron deficiency anemia, unspecified: Secondary | ICD-10-CM | POA: Diagnosis not present

## 2017-01-31 DIAGNOSIS — C649 Malignant neoplasm of unspecified kidney, except renal pelvis: Secondary | ICD-10-CM

## 2017-01-31 DIAGNOSIS — C7889 Secondary malignant neoplasm of other digestive organs: Secondary | ICD-10-CM | POA: Diagnosis not present

## 2017-01-31 DIAGNOSIS — D696 Thrombocytopenia, unspecified: Secondary | ICD-10-CM | POA: Diagnosis not present

## 2017-01-31 DIAGNOSIS — C642 Malignant neoplasm of left kidney, except renal pelvis: Secondary | ICD-10-CM

## 2017-01-31 LAB — CBC WITH DIFFERENTIAL/PLATELET
BASO%: 0.2 % (ref 0.0–2.0)
Basophils Absolute: 0 10*3/uL (ref 0.0–0.1)
EOS%: 1.1 % (ref 0.0–7.0)
Eosinophils Absolute: 0.1 10*3/uL (ref 0.0–0.5)
HCT: 36.1 % — ABNORMAL LOW (ref 38.4–49.9)
HGB: 11.6 g/dL — ABNORMAL LOW (ref 13.0–17.1)
LYMPH%: 15.3 % (ref 14.0–49.0)
MCH: 28 pg (ref 27.2–33.4)
MCHC: 32.1 g/dL (ref 32.0–36.0)
MCV: 87 fL (ref 79.3–98.0)
MONO#: 0.4 10*3/uL (ref 0.1–0.9)
MONO%: 7.3 % (ref 0.0–14.0)
NEUT%: 76.1 % — ABNORMAL HIGH (ref 39.0–75.0)
NEUTROS ABS: 4 10*3/uL (ref 1.5–6.5)
PLATELETS: 147 10*3/uL (ref 140–400)
RBC: 4.15 10*6/uL — AB (ref 4.20–5.82)
RDW: 19.5 % — ABNORMAL HIGH (ref 11.0–14.6)
WBC: 5.3 10*3/uL (ref 4.0–10.3)
lymph#: 0.8 10*3/uL — ABNORMAL LOW (ref 0.9–3.3)

## 2017-01-31 LAB — COMPREHENSIVE METABOLIC PANEL
ALT: 21 U/L (ref 0–55)
ANION GAP: 7 meq/L (ref 3–11)
AST: 25 U/L (ref 5–34)
Albumin: 3.4 g/dL — ABNORMAL LOW (ref 3.5–5.0)
Alkaline Phosphatase: 103 U/L (ref 40–150)
BUN: 14.2 mg/dL (ref 7.0–26.0)
CO2: 27 mEq/L (ref 22–29)
Calcium: 9.1 mg/dL (ref 8.4–10.4)
Chloride: 108 mEq/L (ref 98–109)
Creatinine: 1.3 mg/dL (ref 0.7–1.3)
EGFR: 57 mL/min/{1.73_m2} — AB (ref >90)
GLUCOSE: 89 mg/dL (ref 70–140)
POTASSIUM: 4.3 meq/L (ref 3.5–5.1)
SODIUM: 142 meq/L (ref 136–145)
Total Bilirubin: 0.6 mg/dL (ref 0.20–1.20)
Total Protein: 7.3 g/dL (ref 6.4–8.3)

## 2017-01-31 MED ORDER — CABOZANTINIB S-MALATE 40 MG PO TABS: 1.0000 | ORAL_TABLET | Freq: Every day | ORAL | 0 refills | Status: DC

## 2017-01-31 NOTE — Progress Notes (Signed)
Hematology and Oncology Follow Up Visit  Jeffrey Trujillo [id number] 01/31/17     Principle Diagnosis: 66 year old with the following issues:  1. T1b right kidney cancer diagnosed in 2010. He is S/P rdical nephrectomy on June 27, 2009. The pathological staging was T1b clear cell histology with Fuhrman grade 3/4.  2. Left Kidney mass  Measuring 4.5 x 2 7 x 3.6 irregular enhancing appears to be a new kidney cancer noted in 07/2012. 3. Pancreatic lesion. 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 4. Votrient  800 mg daily started in December 2013. Treatment held in 11/2012 due to increase LFTs. Votrient was resumed in April 2014 at 400 mg daily. His dose was reduced further to 200 mg daily starting in September 2016. Therapy discontinued in June 2017 because of progression of disease.   Current therapy: Cabometyx 40 mg daily started in July 2017. Therapy has been on hold since August of 2017 because of perianal abscess. Therapy was resumed temporarily in December 2017 but discontinued because of relapse in his abscess.  Therapy to be resumed on 12/22/2016.  Interim History: Jeffrey Trujillo presents for a follow up visit. Since his last visit, he resumed Cabometyx without any major complications. He denied any diarrhea, anorexia or nausea. He did not miss any doses and did not report any other complications. His appetite remains excellent and have gained more weight. He denied any arthralgias, myalgias or recent hospitalizations. He denied any recent exacerbation of his rectal abscesses. His quality of life and performance status has improved.  He is not having any fevers or chills. He did not report any headaches blurred vision double vision. Does not report any motor sensory neuropathy or alteration of mental status. He does not report any depression or anxiety. He does not report any frequency urgency or hesitancy. He does not report any other urinary symptoms. Remainder of his  review of systems is unremarkable.  Medications: Unchanged by my review today. Current Outpatient Prescriptions  Medication Sig Dispense Refill  . acetaminophen (TYLENOL) 650 MG CR tablet Take 650 mg by mouth every 8 (eight) hours as needed for pain.    . baclofen (LIORESAL) 20 MG tablet Take 20 mg by mouth 2 (two) times daily.    . colchicine 0.6 MG tablet Take 0.6 mg by mouth daily.    Marland Kitchen docusate sodium (COLACE) 100 MG capsule Take 1 capsule (100 mg total) by mouth every 12 (twelve) hours. 60 capsule 0  . pantoprazole (PROTONIX) 40 MG tablet Take 1 tablet (40 mg total) by mouth 2 (two) times daily before a meal. 60 tablet 5  . topiramate (TOPAMAX) 25 MG tablet Take 25 mg by mouth 2 (two) times daily.     No current facility-administered medications for this visit.      Allergies:  Allergies  Allergen Reactions  . Nsaids Other (See Comments)    Patient only has one kidney.   . Meperidine Hcl Hives and Rash    Past Medical History, Surgical history, Social history, and Family History were reviewed and updated.   Physical Exam: Blood pressure (!) 168/75, pulse 67, temperature 98.3 F (36.8 C), temperature source Oral, resp. rate 17, height 5\' 9"  (1.753 m), weight 181 lb 9.6 oz (82.4 kg), SpO2 100 %. ECOG: 1 General appearance: Well-appearing gentleman appeared without distress. Head: Atraumatic without any oral ulcers or lesions. Neck: no adenopathy, no masses Lymph nodes: Cervical, supraclavicular, and axillary nodes normal. Heart:regular rate and rhythm, S1, S2.  Lung:chest clear, no wheezing, or dullness to percussion. Abdomen: soft, non-tender, without masses or organomegaly. No rebound or guarding. EXT: Trace edema noted. Neurological exam: no motor or sensory deficits noted. Skin: No rashes or lesions noted. no petechiae or ecchymosis.    CBC    Component Value Date/Time   WBC 5.3 56/25/6389 3734   WBC DUPLICATE REQUEST 28/76/8115 1130   RBC 4.15 (L) 72/62/0355  9741   RBC DUPLICATE REQUEST 63/84/5364 1130   HGB 11.6 (L) 01/31/2017 1218   HCT 36.1 (L) 01/31/2017 1218   PLT 147 01/31/2017 1218   MCV 87.0 01/31/2017 1218   MCH 28.0 68/11/2120 4825   MCH DUPLICATE REQUEST 00/37/0488 1130   MCHC 32.1 89/16/9450 3888   MCHC DUPLICATE REQUEST 28/00/3491 1130   RDW 19.5 (H) 01/31/2017 1218   LYMPHSABS 0.8 (L) 01/31/2017 1218   MONOABS 0.4 01/31/2017 1218   EOSABS 0.1 01/31/2017 1218   BASOSABS 0.0 01/31/2017 1218       Impression and Plan:   66 year old gentleman with the following issues:   1. A history of renal cell carcinoma. He had stage T1b diagnosed in October 2010 of the right kidney, status post right nephrectomy. He also has left kidney cancer and pancreatic metastasis. He was initially started on Votrient in December 2013. He had dose reduction on few occasions. The patient currently on Votrient 200 mg daily since September 2016 dose reduction.  CT scan obtained on 02/26/2016 showed slow progression of disease especially in his left renal mass.   He is currently on Cabometyx which Was resumed in April 2018. He denied any recent complications related to the start of this medication. The blasts continue with the same dose and schedule and repeat imaging studies in 3 months.    2. Pancreatic lesion. Biopsy proven to be malignant and likely represent renal cell cancer. Slightly increased based on his last CT scan. Follow-up CT scan will help determine response to therapy.  3. Thrombocytopenia: Platelet is back within normal range.  4. Anemia.  Related to iron deficiency as well as renal insufficiency. Hemoglobin is improving.  5. Increase LFTs:  We will continue to monitor this moving forward. His most recent LFTs were within normal range.  6. Edema: PSA result at this time.  7. Perirectal abscess: Ccontinues to follow with Dr. Marcello Moores. Appears to have resolved at this time.  8. Follow-up:  In 4-6 weeks to follow his  progress.  [name] MD 01/31/17

## 2017-01-31 NOTE — Telephone Encounter (Signed)
Gave patient AVS and calender per 5/17 los.  

## 2017-02-01 ENCOUNTER — Telehealth: Payer: Self-pay | Admitting: *Deleted

## 2017-02-01 NOTE — Telephone Encounter (Signed)
Returned phone call to [facility] regarding patient having no active insurance. Instructed Ronalee Belts that [facility] has no active insurance for patient either. Fredirick Maudlin patient's cell phone number and he is going to call and speak with him.

## 2017-02-28 ENCOUNTER — Other Ambulatory Visit: Payer: Self-pay | Admitting: *Deleted

## 2017-02-28 MED ORDER — CABOZANTINIB S-MALATE 40 MG PO TABS: 1.0000 | ORAL_TABLET | Freq: Every day | ORAL | 0 refills | Status: DC

## 2017-03-13 ENCOUNTER — Other Ambulatory Visit (HOSPITAL_BASED_OUTPATIENT_CLINIC_OR_DEPARTMENT_OTHER): Payer: Medicare Other

## 2017-03-13 ENCOUNTER — Telehealth: Payer: Self-pay | Admitting: Oncology

## 2017-03-13 ENCOUNTER — Ambulatory Visit (HOSPITAL_BASED_OUTPATIENT_CLINIC_OR_DEPARTMENT_OTHER): Payer: Medicare Other | Admitting: Oncology

## 2017-03-13 DIAGNOSIS — C649 Malignant neoplasm of unspecified kidney, except renal pelvis: Secondary | ICD-10-CM

## 2017-03-13 DIAGNOSIS — C7889 Secondary malignant neoplasm of other digestive organs: Secondary | ICD-10-CM

## 2017-03-13 DIAGNOSIS — C641 Malignant neoplasm of right kidney, except renal pelvis: Secondary | ICD-10-CM

## 2017-03-13 DIAGNOSIS — N289 Disorder of kidney and ureter, unspecified: Secondary | ICD-10-CM | POA: Diagnosis not present

## 2017-03-13 DIAGNOSIS — D696 Thrombocytopenia, unspecified: Secondary | ICD-10-CM | POA: Diagnosis not present

## 2017-03-13 DIAGNOSIS — D509 Iron deficiency anemia, unspecified: Secondary | ICD-10-CM

## 2017-03-13 LAB — CBC WITH DIFFERENTIAL/PLATELET
BASO%: 0 % (ref 0.0–2.0)
Basophils Absolute: 0 10*3/uL (ref 0.0–0.1)
EOS%: 0 % (ref 0.0–7.0)
Eosinophils Absolute: 0 10*3/uL (ref 0.0–0.5)
HEMATOCRIT: 33.7 % — AB (ref 38.4–49.9)
HGB: 10.9 g/dL — ABNORMAL LOW (ref 13.0–17.1)
LYMPH#: 0.6 10*3/uL — AB (ref 0.9–3.3)
LYMPH%: 8.5 % — ABNORMAL LOW (ref 14.0–49.0)
MCH: 30.3 pg (ref 27.2–33.4)
MCHC: 32.3 g/dL (ref 32.0–36.0)
MCV: 93.6 fL (ref 79.3–98.0)
MONO#: 0.6 10*3/uL (ref 0.1–0.9)
MONO%: 8.6 % (ref 0.0–14.0)
NEUT%: 82.9 % — ABNORMAL HIGH (ref 39.0–75.0)
NEUTROS ABS: 6.2 10*3/uL (ref 1.5–6.5)
PLATELETS: 168 10*3/uL (ref 140–400)
RBC: 3.6 10*6/uL — ABNORMAL LOW (ref 4.20–5.82)
RDW: 19.7 % — ABNORMAL HIGH (ref 11.0–14.6)
WBC: 7.4 10*3/uL (ref 4.0–10.3)

## 2017-03-13 LAB — COMPREHENSIVE METABOLIC PANEL
ALBUMIN: 3.3 g/dL — AB (ref 3.5–5.0)
ALK PHOS: 80 U/L (ref 40–150)
ALT: 12 U/L (ref 0–55)
ANION GAP: 11 meq/L (ref 3–11)
AST: 15 U/L (ref 5–34)
BILIRUBIN TOTAL: 0.59 mg/dL (ref 0.20–1.20)
BUN: 17.3 mg/dL (ref 7.0–26.0)
CALCIUM: 9.8 mg/dL (ref 8.4–10.4)
CO2: 27 mEq/L (ref 22–29)
CREATININE: 1.3 mg/dL (ref 0.7–1.3)
Chloride: 107 mEq/L (ref 98–109)
EGFR: 58 mL/min/{1.73_m2} — ABNORMAL LOW (ref >90)
Glucose: 111 mg/dl (ref 70–140)
Potassium: 3.9 mEq/L (ref 3.5–5.1)
Sodium: 145 mEq/L (ref 136–145)
TOTAL PROTEIN: 7.2 g/dL (ref 6.4–8.3)

## 2017-03-13 NOTE — Progress Notes (Signed)
Hematology and Oncology Follow Up Visit  Jeffrey Trujillo [id number] 03/13/17     Principle Diagnosis: 66 year old with the following issues:  1. T1b right kidney cancer diagnosed in 2010. He is S/P rdical nephrectomy on June 27, 2009. The pathological staging was T1b clear cell histology with Fuhrman grade 3/4.  2. Left Kidney mass  Measuring 4.5 x 2 7 x 3.6 irregular enhancing appears to be a new kidney cancer noted in 07/2012. 3. Pancreatic lesion. 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 4. Votrient  800 mg daily started in December 2013. Treatment held in 11/2012 due to increase LFTs. Votrient was resumed in April 2014 at 400 mg daily. His dose was reduced further to 200 mg daily starting in September 2016. Therapy discontinued in June 2017 because of progression of disease.   Current therapy: Cabometyx 40 mg daily started in July 2017. Therapy has been on hold since August of 2017 because of perianal abscess. Therapy was resumed temporarily in December 2017 but discontinued because of relapse in his abscess.  Therapy to be resumed on 12/22/2016.  Interim History: Jeffrey Trujillo presents for a follow up visit. Since his last visit, he continues to do reasonably well without any complaints. He has tolerated Cabometyx without any major complications. He denied any diarrhea, anorexia or nausea. His appetite remains excellent and have gained more weight. He denied any arthralgias, myalgias or recent hospitalizations. He denied any recent exacerbation of his rectal abscesses. He does report some mild drainage which has not changed dramatically. His taste has been on alternative this time.  He is not having any fevers or chills. He did not report any headaches blurred vision double vision. Does not report any motor sensory neuropathy or alteration of mental status. He does not report any depression or anxiety. He does not report any frequency urgency or hesitancy. He does not  report any other urinary symptoms. Remainder of his review of systems is unremarkable.  Medications: Unchanged by my review today. Current Outpatient Prescriptions  Medication Sig Dispense Refill  . acetaminophen (TYLENOL) 650 MG CR tablet Take 650 mg by mouth every 8 (eight) hours as needed for pain.    . baclofen (LIORESAL) 20 MG tablet Take 20 mg by mouth 2 (two) times daily.    . cabozantinib S-Malate 40 MG TABS Take 1 tablet by mouth daily. 30 tablet 0  . colchicine 0.6 MG tablet Take 0.6 mg by mouth daily.    Marland Kitchen docusate sodium (COLACE) 100 MG capsule Take 1 capsule (100 mg total) by mouth every 12 (twelve) hours. 60 capsule 0  . pantoprazole (PROTONIX) 40 MG tablet Take 1 tablet (40 mg total) by mouth 2 (two) times daily before a meal. 60 tablet 5  . topiramate (TOPAMAX) 25 MG tablet Take 25 mg by mouth 2 (two) times daily.     No current facility-administered medications for this visit.      Allergies:  Allergies  Allergen Reactions  . Nsaids Other (See Comments)    Patient only has one kidney.   . Meperidine Hcl Hives and Rash    Past Medical History, Surgical history, Social history, and Family History were reviewed and updated.   Physical Exam: His blood pressure today is 139/56. Pulse 60, respiration 18 and he weighs 181 pounds. ECOG: 1 General appearance: Alert, awake gentleman without distress. Head: Atraumatic without any oral ulcers or lesions. Neck: no adenopathy, no masses Lymph nodes: Cervical, supraclavicular, and axillary nodes normal. Heart:regular  rate and rhythm, S1, S2.  Lung:chest clear, no wheezing, or dullness to percussion. Abdomen: soft, non-tender, without masses or organomegaly. No shifting dullness or ascites. EXT: Trace edema noted. Neurological exam: no motor or sensory deficits noted. Skin: No rashes or lesions noted. no petechiae or ecchymosis.    CBC    Component Value Date/Time   WBC 7.4 25/85/2778 2423   WBC DUPLICATE REQUEST  53/61/4431 1130   RBC 3.60 (L) 54/00/8676 1950   RBC DUPLICATE REQUEST 93/26/7124 1130   HGB 10.9 (L) 03/13/2017 1527   HCT 33.7 (L) 03/13/2017 1527   PLT 168 03/13/2017 1527   MCV 93.6 03/13/2017 1527   MCH 30.3 58/05/9832 8250   MCH DUPLICATE REQUEST 53/97/6734 1130   MCHC 32.3 19/37/9024 0973   MCHC DUPLICATE REQUEST 53/29/9242 1130   RDW 19.7 (H) 03/13/2017 1527   LYMPHSABS 0.6 (L) 03/13/2017 1527   MONOABS 0.6 03/13/2017 1527   EOSABS 0.0 03/13/2017 1527   BASOSABS 0.0 03/13/2017 1527       Impression and Plan:   66 year old gentleman with the following issues:   1. A history of renal cell carcinoma. He had stage T1b diagnosed in October 2010 of the right kidney, status post right nephrectomy. He also has left kidney cancer and pancreatic metastasis. He was initially started on Votrient in December 2013. He had dose reduction on few occasions. The patient currently on Votrient 200 mg daily since September 2016 dose reduction.  CT scan obtained on 02/26/2016 showed slow progression of disease especially in his left renal mass.   He is currently on Cabometyx which was resumed in April 2018.   He continues to tolerate this medication without complications. Risks and benefits of continuing it today was reviewed and is agreeable to continue. We will repeat imaging studies in September 2018.    2. Pancreatic lesion. Biopsy proven to be malignant and likely represent renal cell cancer. Slightly increased based on his last CT scan. Follow-up CT scan will help determine response to therapy.  3. Thrombocytopenia: Controlled at this time.  4. Anemia.  Related to iron deficiency as well as renal insufficiency. Hemoglobin is improving.  5. Increase LFTs:  We will continue to monitor this moving forward. His most recent LFTs were within normal range.  6. Perirectal abscess: Ccontinues to follow with Dr. Marcello Moores. Resolved without any recent complications at this time.  7.  Follow-up:  In 4-6 weeks to follow his progress.  [name] MD 03/13/17

## 2017-03-13 NOTE — Telephone Encounter (Signed)
Gave patient avs report and appointments for August.  °

## 2017-04-19 ENCOUNTER — Ambulatory Visit: Payer: Medicare Other | Admitting: Oncology

## 2017-04-19 ENCOUNTER — Other Ambulatory Visit: Payer: Medicare Other

## 2017-04-22 ENCOUNTER — Telehealth: Payer: Self-pay | Admitting: Oncology

## 2017-04-22 NOTE — Telephone Encounter (Signed)
R/s 8/3 appt and left message with appt date and time -

## 2017-05-21 ENCOUNTER — Telehealth: Payer: Self-pay | Admitting: Oncology

## 2017-05-21 ENCOUNTER — Other Ambulatory Visit: Payer: Medicare Other

## 2017-05-21 ENCOUNTER — Ambulatory Visit: Payer: Medicare Other | Admitting: Oncology
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
## 2017-05-31 ENCOUNTER — Other Ambulatory Visit: Payer: Medicare Other

## 2017-05-31 ENCOUNTER — Ambulatory Visit: Payer: Medicare Other | Admitting: Oncology

## 2017-06-03 ENCOUNTER — Other Ambulatory Visit: Payer: Self-pay | Admitting: *Deleted

## 2017-06-03 MED ORDER — CABOZANTINIB S-MALATE 40 MG PO TABS: 1.0000 | ORAL_TABLET | Freq: Every day | ORAL | 0 refills | Status: DC

## 2017-06-14 ENCOUNTER — Telehealth: Payer: Self-pay | Admitting: Oncology
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
## 2017-06-25 ENCOUNTER — Ambulatory Visit (HOSPITAL_BASED_OUTPATIENT_CLINIC_OR_DEPARTMENT_OTHER): Payer: Medicare Other | Admitting: Oncology

## 2017-06-25 ENCOUNTER — Other Ambulatory Visit (HOSPITAL_BASED_OUTPATIENT_CLINIC_OR_DEPARTMENT_OTHER): Payer: Medicare Other

## 2017-06-25 ENCOUNTER — Telehealth: Payer: Self-pay | Admitting: Oncology

## 2017-06-25 ENCOUNTER — Encounter: Payer: Self-pay | Admitting: *Deleted

## 2017-06-25 VITALS — BP 164/77 | HR 98 | Temp 98.0°F | Resp 18 | Ht 69.0 in | Wt 177.3 lb

## 2017-06-25 DIAGNOSIS — D696 Thrombocytopenia, unspecified: Secondary | ICD-10-CM | POA: Diagnosis not present

## 2017-06-25 DIAGNOSIS — N2889 Other specified disorders of kidney and ureter: Secondary | ICD-10-CM

## 2017-06-25 DIAGNOSIS — D509 Iron deficiency anemia, unspecified: Secondary | ICD-10-CM | POA: Diagnosis not present

## 2017-06-25 DIAGNOSIS — C649 Malignant neoplasm of unspecified kidney, except renal pelvis: Secondary | ICD-10-CM

## 2017-06-25 DIAGNOSIS — C642 Malignant neoplasm of left kidney, except renal pelvis: Secondary | ICD-10-CM

## 2017-06-25 DIAGNOSIS — K8689 Other specified diseases of pancreas: Secondary | ICD-10-CM

## 2017-06-25 LAB — CBC WITH DIFFERENTIAL/PLATELET
BASO%: 0.7 % (ref 0.0–2.0)
Basophils Absolute: 0 10*3/uL (ref 0.0–0.1)
EOS%: 2.3 % (ref 0.0–7.0)
Eosinophils Absolute: 0.1 10*3/uL (ref 0.0–0.5)
HCT: 34.1 % — ABNORMAL LOW (ref 38.4–49.9)
HGB: 11.4 g/dL — ABNORMAL LOW (ref 13.0–17.1)
LYMPH%: 17.5 % (ref 14.0–49.0)
MCH: 32 pg (ref 27.2–33.4)
MCHC: 33.5 g/dL (ref 32.0–36.0)
MCV: 95.7 fL (ref 79.3–98.0)
MONO#: 0.2 10*3/uL (ref 0.1–0.9)
MONO%: 5.1 % (ref 0.0–14.0)
NEUT%: 74.4 % (ref 39.0–75.0)
NEUTROS ABS: 2.5 10*3/uL (ref 1.5–6.5)
PLATELETS: 138 10*3/uL — AB (ref 140–400)
RBC: 3.57 10*6/uL — AB (ref 4.20–5.82)
RDW: 21.7 % — ABNORMAL HIGH (ref 11.0–14.6)
WBC: 3.3 10*3/uL — AB (ref 4.0–10.3)
lymph#: 0.6 10*3/uL — ABNORMAL LOW (ref 0.9–3.3)

## 2017-06-25 LAB — COMPREHENSIVE METABOLIC PANEL
ALT: 19 U/L (ref 0–55)
ANION GAP: 7 meq/L (ref 3–11)
AST: 23 U/L (ref 5–34)
Albumin: 3.3 g/dL — ABNORMAL LOW (ref 3.5–5.0)
Alkaline Phosphatase: 86 U/L (ref 40–150)
BILIRUBIN TOTAL: 0.83 mg/dL (ref 0.20–1.20)
BUN: 11 mg/dL (ref 7.0–26.0)
CO2: 30 meq/L — AB (ref 22–29)
CREATININE: 1.2 mg/dL (ref 0.7–1.3)
Calcium: 9.3 mg/dL (ref 8.4–10.4)
Chloride: 106 mEq/L (ref 98–109)
EGFR: 61 mL/min/{1.73_m2} — ABNORMAL LOW (ref >90)
GLUCOSE: 95 mg/dL (ref 70–140)
Potassium: 4 mEq/L (ref 3.5–5.1)
SODIUM: 143 meq/L (ref 136–145)
TOTAL PROTEIN: 6.8 g/dL (ref 6.4–8.3)

## 2017-06-25 NOTE — Telephone Encounter (Signed)
Scheduled appt per 10/9 los - gave patient AVS and calender per los. - [facility] to contact patient with ct schedule.

## 2017-06-25 NOTE — Progress Notes (Signed)
Per patient request and [name], I wrote a letter excusing him from jury duty. Letter mailed to   Jeffrey Trujillo Senior Resident [address] [place], [place] [address].  Letter mailed from [facility] today. Patient was given a copy of the letter.

## 2017-06-25 NOTE — Progress Notes (Signed)
Hematology and Oncology Follow Up Visit  GARIK DIAMANT [id number] 06/25/17     Principle Diagnosis: 66 year old with the following issues:  1. T1b right kidney cancer diagnosed in 2010. He is S/P rdical nephrectomy on June 27, 2009. The pathological staging was T1b clear cell histology with Fuhrman grade 3/4.  2. Left Kidney mass  Measuring 4.5 x 2 7 x 3.6 irregular enhancing appears to be a new kidney cancer noted in 07/2012. 3. Pancreatic lesion. 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 4. Votrient  800 mg daily started in December 2013. Treatment held in 11/2012 due to increase LFTs. Votrient was resumed in April 2014 at 400 mg daily. His dose was reduced further to 200 mg daily starting in September 2016. Therapy discontinued in June 2017 because of progression of disease.   Current therapy: Cabometyx 40 mg daily started in July 2017. Therapy has been on hold since August of 2017 because of perianal abscess. Therapy was resumed temporarily in December 2017 but discontinued because of relapse in his abscess.  Therapy to be resumed on 12/22/2016.  Interim History: Mr. Raju presents for a follow up visit. Since his last visit, he reports no complaints. He denied any recent hospitalizations or illnesses. He reports no issues related to Cabometyx. He denied any diarrhea, anorexia or nausea. His appetite remains excellent and his weight is stable. He denied any arthralgias, myalgias or recent hospitalizations. He denied any recent exacerbation of his rectal abscesses. He does report some mild drainage which has not changed dramatically. He has no difficulties obtaining or taking this medication at this time.  He is not having any fevers or chills. He did not report any headaches blurred vision double vision. Does not report any motor sensory neuropathy or alteration of mental status. He does not report any depression or anxiety. He does not report any frequency urgency or  hesitancy. He does not report any other urinary symptoms. Remainder of his review of systems is unremarkable.  Medications: Unchanged by my review today. Current Outpatient Prescriptions  Medication Sig Dispense Refill  . acetaminophen (TYLENOL) 650 MG CR tablet Take 650 mg by mouth every 8 (eight) hours as needed for pain.    . baclofen (LIORESAL) 20 MG tablet Take 20 mg by mouth 2 (two) times daily.    . cabozantinib S-Malate 40 MG TABS Take 1 tablet by mouth daily. 30 tablet 0  . colchicine 0.6 MG tablet Take 0.6 mg by mouth daily.    Marland Kitchen docusate sodium (COLACE) 100 MG capsule Take 1 capsule (100 mg total) by mouth every 12 (twelve) hours. 60 capsule 0  . pantoprazole (PROTONIX) 40 MG tablet Take 1 tablet (40 mg total) by mouth 2 (two) times daily before a meal. 60 tablet 5  . topiramate (TOPAMAX) 25 MG tablet Take 25 mg by mouth 2 (two) times daily.     No current facility-administered medications for this visit.      Allergies:  Allergies  Allergen Reactions  . Nsaids Other (See Comments)    Patient only has one kidney.   . Meperidine Hcl Hives and Rash    Past Medical History, Surgical history, Social history, and Family History were reviewed and updated.   Physical Exam: Blood pressure (!) 164/77, pulse 98, temperature 98 F (36.7 C), temperature source Oral, resp. rate 18, height 5\' 9"  (1.753 m), weight 177 lb 4.8 oz (80.4 kg), SpO2 100 %.   ECOG: 1 General appearance: Well-appearing gentleman without  distress. Head: No oral thrush or ulcers. Neck: no adenopathy, no masses Lymph nodes: Cervical, supraclavicular, and axillary nodes normal. Heart:regular rate and rhythm, S1, S2. No murmurs or gallops. Lung:chest clear, no wheezing, or dullness to percussion. Abdomen: soft, non-tender, without masses or organomegaly. No rebound or guarding. EXT: Trace edema noted. Neurological exam: no motor or sensory deficits noted. Skin: No rashes or lesions noted. no petechiae or  ecchymosis.    CBC    Component Value Date/Time   WBC 3.3 (L) 96/22/2979 8921   WBC DUPLICATE REQUEST 19/41/7408 1130   RBC 3.57 (L) 14/48/1856 3149   RBC DUPLICATE REQUEST 70/26/3785 1130   HGB 11.4 (L) 06/25/2017 0956   HCT 34.1 (L) 06/25/2017 0956   PLT 138 (L) 06/25/2017 0956   MCV 95.7 06/25/2017 0956   MCH 32.0 88/50/2774 1287   MCH DUPLICATE REQUEST 86/76/7209 1130   MCHC 33.5 47/05/6282 6629   MCHC DUPLICATE REQUEST 47/65/4650 1130   RDW 21.7 (H) 06/25/2017 0956   LYMPHSABS 0.6 (L) 06/25/2017 0956   MONOABS 0.2 06/25/2017 0956   EOSABS 0.1 06/25/2017 0956   BASOSABS 0.0 06/25/2017 0956       Impression and Plan:   66 year old gentleman with the following issues:   1. A history of renal cell carcinoma. He had stage T1b diagnosed in October 2010 of the right kidney, status post right nephrectomy. He also has left kidney cancer and pancreatic metastasis. He was initially started on Votrient in December 2013. He had dose reduction on few occasions. The patient currently on Votrient 200 mg daily since September 2016 dose reduction.  CT scan obtained on 02/26/2016 showed slow progression of disease especially in his left renal mass.   He is currently on Cabometyx which was resumed in April 2018.   Risks and benefits of continuing this medication was reviewed today and is agreeable to continue. The plan is to repeat imaging studies before the next visit in 2017-09-10.   2. Pancreatic lesion. Biopsy proven to be malignant due to renal cell cancer metastasis. Repeat imaging studies will follow the status of this lesion.  3. Thrombocytopenia: Remains mild without any bleeding episodes.  4. Anemia.  Related to iron deficiency as well as renal insufficiency. Hemoglobin continues to improve  5. Increase LFTs:  We will continue to monitor this moving forward. LFTs from June 2018 were normal.  6. Perirectal abscess: Ccontinues to follow with Dr. Marcello Moores. Resolved without  any recent complications at this time.  7. Follow-up:  In 4-6 weeks after repeat CT scan.  [name] MD 06/25/17

## 2017-06-28 ENCOUNTER — Telehealth: Payer: Self-pay | Admitting: *Deleted

## 2017-06-28 NOTE — Telephone Encounter (Signed)
"  My husband was there Tuesday.  Was scheduled for scans but was not provided contrast.  We do not live in [place].  Could the contrast be sent to our home?"  Suggested early arrival 07-30-2017 of  9:00 am to drink contrast at 9:30 am, 10:30 am for 11:30 am CT C/A/P or try a [place] satellite.  "He likes the contrast cold, will it be cold.  We are closest to [facility] [address]  Provided [facility] phone number [phone]) to ask if xray department or cancer center can be of any assistance to them.  May pick up from [facility] at any time on or before 07-30-2017.  No further questions.

## 2017-07-09 ENCOUNTER — Other Ambulatory Visit: Payer: Self-pay | Admitting: Oncology

## 2017-07-27 ENCOUNTER — Emergency Department (HOSPITAL_COMMUNITY)
Admission: EM | Admit: 2017-07-27 | Discharge: 2017-07-27 | Disposition: A | Discharge status: Home / Self Care | Payer: Medicare Other | Attending: Emergency Medicine | Admitting: Emergency Medicine

## 2017-07-27 ENCOUNTER — Encounter (HOSPITAL_COMMUNITY): Payer: Self-pay | Admitting: Emergency Medicine

## 2017-07-27 ENCOUNTER — Other Ambulatory Visit: Payer: Self-pay

## 2017-07-27 ENCOUNTER — Emergency Department (HOSPITAL_COMMUNITY): Payer: Medicare Other

## 2017-07-27 DIAGNOSIS — Z79899 Other long term (current) drug therapy: Secondary | ICD-10-CM | POA: Diagnosis not present

## 2017-07-27 DIAGNOSIS — M154 Erosive (osteo)arthritis: Secondary | ICD-10-CM | POA: Insufficient documentation

## 2017-07-27 DIAGNOSIS — L03119 Cellulitis of unspecified part of limb: Secondary | ICD-10-CM

## 2017-07-27 DIAGNOSIS — M79671 Pain in right foot: Secondary | ICD-10-CM | POA: Diagnosis present

## 2017-07-27 DIAGNOSIS — F17220 Nicotine dependence, chewing tobacco, uncomplicated: Secondary | ICD-10-CM | POA: Insufficient documentation

## 2017-07-27 DIAGNOSIS — N183 Chronic kidney disease, stage 3 (moderate): Secondary | ICD-10-CM | POA: Insufficient documentation

## 2017-07-27 DIAGNOSIS — L03115 Cellulitis of right lower limb: Secondary | ICD-10-CM | POA: Insufficient documentation

## 2017-07-27 DIAGNOSIS — R609 Edema, unspecified: Secondary | ICD-10-CM

## 2017-07-27 LAB — BASIC METABOLIC PANEL
ANION GAP: 9 (ref 5–15)
BUN: 13 mg/dL (ref 6–20)
CALCIUM: 8.8 mg/dL — AB (ref 8.9–10.3)
CO2: 32 mmol/L (ref 22–32)
Chloride: 98 mmol/L — ABNORMAL LOW (ref 101–111)
Creatinine, Ser: 1.18 mg/dL (ref 0.61–1.24)
GFR calc Af Amer: >60 mL/min (ref >60)
GFR calc non Af Amer: >60 mL/min (ref >60)
GLUCOSE: 95 mg/dL (ref 65–99)
POTASSIUM: 3.3 mmol/L — AB (ref 3.5–5.1)
Sodium: 139 mmol/L (ref 135–145)

## 2017-07-27 LAB — CBC WITH DIFFERENTIAL/PLATELET
BASOS ABS: 0 10*3/uL (ref 0.0–0.1)
BASOS PCT: 0 %
EOS ABS: 0.1 10*3/uL (ref 0.0–0.7)
EOS PCT: 2 %
HCT: 37 % — ABNORMAL LOW (ref 39.0–52.0)
HEMOGLOBIN: 12 g/dL — AB (ref 13.0–17.0)
LYMPHS PCT: 14 %
Lymphs Abs: 0.8 10*3/uL (ref 0.7–4.0)
MCH: 34.4 pg — ABNORMAL HIGH (ref 26.0–34.0)
MCHC: 32.4 g/dL (ref 30.0–36.0)
MCV: 106 fL — ABNORMAL HIGH (ref 78.0–100.0)
Monocytes Absolute: 0.3 10*3/uL (ref 0.1–1.0)
Monocytes Relative: 6 %
NEUTROS ABS: 4.2 10*3/uL (ref 1.7–7.7)
Neutrophils Relative %: 78 %
PLATELETS: 118 10*3/uL — AB (ref 150–400)
RBC: 3.49 MIL/uL — AB (ref 4.22–5.81)
RDW: 18.4 % — ABNORMAL HIGH (ref 11.5–15.5)
WBC: 5.4 10*3/uL (ref 4.0–10.5)

## 2017-07-27 MED ORDER — POTASSIUM CHLORIDE CRYS ER 20 MEQ PO TBCR
20.0000 meq | EXTENDED_RELEASE_TABLET | Freq: Two times a day (BID) | ORAL | Status: DC
  Administered 2017-07-27: 20 meq via ORAL
  Filled 2017-07-27: qty 1

## 2017-07-27 MED ORDER — HYDROCODONE-ACETAMINOPHEN 5-325 MG PO TABS: 1.0000 | ORAL_TABLET | ORAL | 0 refills | Status: DC | PRN

## 2017-07-27 MED ORDER — PROMETHAZINE HCL 12.5 MG PO TABS
12.5000 mg | ORAL_TABLET | Freq: Once | ORAL | Status: AC
  Administered 2017-07-27: 12.5 mg via ORAL
  Filled 2017-07-27: qty 1

## 2017-07-27 MED ORDER — DEXAMETHASONE 4 MG PO TABS: 4.0000 mg | ORAL_TABLET | Freq: Two times a day (BID) | ORAL | 0 refills | Status: DC

## 2017-07-27 MED ORDER — CEFDINIR 300 MG PO CAPS: 300.0000 mg | ORAL_CAPSULE | Freq: Two times a day (BID) | ORAL | 0 refills | Status: DC

## 2017-07-27 MED ORDER — PROMETHAZINE HCL 25 MG/ML IJ SOLN
12.5000 mg | Freq: Once | INTRAMUSCULAR | Status: AC
  Administered 2017-07-27: 12.5 mg via INTRAVENOUS
  Filled 2017-07-27: qty 1

## 2017-07-27 MED ORDER — PREDNISONE 50 MG PO TABS
60.0000 mg | ORAL_TABLET | Freq: Once | ORAL | Status: AC
  Administered 2017-07-27: 17:00:00 60 mg via ORAL
  Filled 2017-07-27: qty 1

## 2017-07-27 MED ORDER — MORPHINE SULFATE (PF) 2 MG/ML IV SOLN
2.0000 mg | Freq: Once | INTRAVENOUS | Status: AC
  Administered 2017-07-27: 2 mg via INTRAVENOUS
  Filled 2017-07-27: qty 1

## 2017-07-27 NOTE — ED Notes (Signed)
IV attempt unsucessful

## 2017-07-27 NOTE — ED Notes (Signed)
To Rad 

## 2017-07-27 NOTE — ED Notes (Signed)
From Rad 

## 2017-07-27 NOTE — Discharge Instructions (Signed)
Your examination questions possible cellulitis of the toe and the top of the foot.  The x-ray shows an erosive arthritis present.  Please use Omnicef and Decadron 2 times daily with food.  Use Tylenol extra strength for mild pain, use Norco for severe pain.  Please keep your foot elevated as much as possible.  Please use your wooden shoe to decrease pain of your foot.  Use hydrocodone for more severe pain.  Hydrocodone may cause drowsiness, lightheadedness.  Please do not drive, operate machinery, and will sharp objects, or participate in activities requiring concentration when taking this medication.  Please have your orthopedic physician or your primary physician evaluate your foot on Monday, November 12 to make sure the redness and possible cellulitis are improving.  Return to the emergency department if any changes, problems, or concerns.

## 2017-07-27 NOTE — ED Triage Notes (Addendum)
Patient c/o "gout pain" in right foot. Per patient pain started yesterday. Patient denies taking any medication for it. Patient states last time he had gout  was "a few months" ago and he was given prednisone pack.

## 2017-07-27 NOTE — ED Provider Notes (Signed)
[facility] EMERGENCY DEPARTMENT Provider Note   CSN: [id number] Arrival date & time: 07/27/17  1257     History   Chief Complaint Chief Complaint  Patient presents with  . Gout    HPI Jeffrey Trujillo is a 66 y.o. male.  Patient is a 66 year old male who presents to the emergency department with a complaint of gout in his right foot.  The patient states that this problem started yesterday.  He has not taken any medication for it.  He says that he thinks that it may be related to gout, he has had a problem with gout about 2-3 months ago.  Patient denies any injury or trauma to the toe that he is aware of.  He states however that he has renal cancer and he takes daily oral chemotherapy.  He has not had any fever or chills.  He has not had any fast heart rate that he is aware of or any palpitations.  Is been no new nausea or vomiting.  He does have pain however both at rest and when attempting to stand or walk on the right foot.  He presents to the emergency department for evaluation.   The history is provided by the patient.    Past Medical History:  Diagnosis Date  . Anal fistula   . Anemia in chronic renal disease   . Barrett's esophagus    by EGD 11/02/2009  . Chronic pain   . CKD (chronic kidney disease), stage III ([place])   . Diverticulosis of colon    by colonoscopy 11/02/2009  . DJD (degenerative joint disease) of cervical spine   . GERD (gastroesophageal reflux disease)   . Jeffrey Trujillo    per pt on 05-16-2016 stable (last bout bilatearl wrist 03/ 2017)  . History of diverticulitis of colon    w/ small perforation , no surgical intervention 06/ 2014  . History of peptic ulcer disease   . Iron deficiency anemia due to chronic blood loss   . Metastatic renal cell carcinoma [facility]) oncologist-  dr Alen Blew--  currently on oral chemo -- RCC w/ mets to pancreas slow progression per last ct 06/ 2017   dx 2010 right renal cell carcinoma (pT1b, Fuhrman grade 3/4) s/p  radical  nephrectomy 06-27-2009/  dx left renal cell carcinoma w/ mets to pancreas 11/ 2013    . Nocturia   . Thrombocytopenia ([place])    secondary to oral chemo  . Wears glasses     Patient Active Problem List   Diagnosis Date Noted  . History of renal cell cancer   . Chronic neck pain   . Goals of care, counseling/discussion   . Palliative care encounter   . Encephalopathy acute 11/15/2016  . Fever 12/07/2015  . Jeffrey Trujillo 12/07/2015  . Fall   . Pyrexia   . Acute gout 11/15/2015  . Perirectal abscess 11/12/2015  . Hypokalemia 11/12/2015  . Anemia of chronic disease 11/12/2015  . Gallstones   . Sinus bradycardia 02/14/2015  . Malnutrition of moderate degree ([place]) 02/12/2015  . Elevated LFTs 02/12/2015  . Abdominal pain, right upper quadrant 02/12/2015  . Acute kidney injury ([place]) 02/12/2015  . Cholecystitis 02/11/2015  . Hiatal hernia   . Chest pain 04/15/2014  . PUD (peptic ulcer disease) 04/21/2013  . Melena 03/10/2013  . Abdominal pain 03/10/2013  . Leukopenia 03/10/2013  . Thrombocytopenia ([place]) 03/10/2013  . Abdominal abscess 03/10/2013  . Anemia 03/10/2013  . ETOH abuse 03/10/2013  . Bradycardia 03/10/2013  .  Barrett's esophagus 01/04/2013  . Renal cell cancer ([place]) 09/25/2012  . Lower GI bleed 09/25/2012  . Anemia due to blood loss 09/25/2012  . Acute gout of right foot 09/25/2012  . DJD (degenerative joint disease) 09/25/2012  . Pancreatic mass 08/04/2012  . Acute strain of neck muscle 06/05/2011  . RECTAL BLEEDING 10/20/2009  . NAUSEA WITH VOMITING 10/20/2009  . DIARRHEA, BLOODY 10/20/2009    Past Surgical History:  Procedure Laterality Date  . COLONOSCOPY  Feb 2011   [name]: single anal papilla, pan-colonic diverticula  . ESOPHAGOGASTRODUODENOSCOPY  Feb 2011   [name]: salmon-colored epithelium consistent with Barrett's, nodular antral erosions, small hiatal hernia, negative H.pylori  . INGUINAL HERNIA REPAIR Bilateral 1990's  . LAPAROSCOPIC  NEPHRECTOMY Right 06/27/2009   dr Alinda Money  . LUNG REMOVAL, PARTIAL  1970   left lower lobe, benign       Home Medications    Prior to Admission medications   Medication Sig Start Date End Date Taking? Authorizing Provider  acetaminophen (TYLENOL) 650 MG CR tablet Take 650 mg by mouth every 8 (eight) hours as needed for pain.    [provider]  baclofen (LIORESAL) 20 MG tablet Take 20 mg by mouth 2 (two) times daily.    [provider]  CABOMETYX 40 MG TABS TAKE 1 TABLET BY MOUTH EVERY DAY ON AN EMPTY STOMACH 1 HOUR BEFORE OR 2 HOURS AFTER A MEAL 07/09/17   [name], MD  colchicine 0.6 MG tablet Take 0.6 mg by mouth daily.    [provider]  docusate sodium (COLACE) 100 MG capsule Take 1 capsule (100 mg total) by mouth every 12 (twelve) hours. 02/26/16   Mesner, Corene Cornea, MD  pantoprazole (PROTONIX) 40 MG tablet Take 1 tablet (40 mg total) by mouth 2 (two) times daily before a meal. 04/12/16   Annitta Needs, NP  topiramate (TOPAMAX) 25 MG tablet Take 25 mg by mouth 2 (two) times daily.    [provider]    Family History Family History  Problem Relation Age of Onset  . Lung cancer Brother   . Prostate cancer Brother   . Lumbar disc disease Brother   . Prostate cancer Brother   . Colon cancer Neg Hx     Social History Social History   Tobacco Use  . Smoking status: Former Smoker    Packs/day: 0.25    Years: 4.00    Pack years: 1.00    Types: Cigars    Last attempt to quit: 05/16/2014    Years since quitting: 3.2  . Smokeless tobacco: Current User    Types: Chew  Substance Use Topics  . Alcohol use: Yes    Alcohol/week: 2.4 - 3.0 oz    Types: 2 - 3 Cans of beer, 2 Shots of liquor per week    Comment: binges intermittently, 1/5 vodka daily until it makes him sick, last ETOH was about 3 months ago  . Drug use: No     Allergies   Amoxicillin; Nsaids; Zofran [ondansetron hcl]; and Demerol [meperidine]   Review of Systems Review  of Systems  Constitutional: Negative for activity change.       All ROS Neg except as noted in HPI  HENT: Negative for nosebleeds.   Eyes: Negative for photophobia and discharge.  Respiratory: Negative for cough, shortness of breath and wheezing.   Cardiovascular: Negative for chest pain and palpitations.  Gastrointestinal: Negative for abdominal pain, blood in stool and vomiting.  Genitourinary:  Negative for dysuria, frequency and hematuria.  Musculoskeletal: Positive for arthralgias. Negative for back pain and neck pain.  Skin: Negative.   Neurological: Negative for dizziness, seizures and speech difficulty.  Psychiatric/Behavioral: Negative for confusion and hallucinations.     Physical Exam Updated Vital Signs BP (!) 141/95   Pulse (!) 104   Temp 98.2 F (36.8 C) (Oral)   Resp 19   Ht 5\' 11"  (1.803 m)   Wt 83.9 kg (185 lb)   SpO2 100%   BMI 25.80 kg/m   Physical Exam  Constitutional: He is oriented to person, place, and time. He appears well-developed and well-nourished.  Non-toxic appearance.  HENT:  Head: Normocephalic.  Right Ear: Tympanic membrane and external ear normal.  Left Ear: Tympanic membrane and external ear normal.  Eyes: EOM and lids are normal. Pupils are equal, round, and reactive to light.  Neck: Normal range of motion. Neck supple. Carotid bruit is not present.  Cardiovascular: Normal rate, regular rhythm, normal heart sounds, intact distal pulses and normal pulses.  Pulmonary/Chest: Breath sounds normal. No respiratory distress.  Abdominal: Soft. Bowel sounds are normal. There is no tenderness. There is no guarding.  Musculoskeletal: Normal range of motion.  There is increased redness and swelling of the second toe of the right foot.  There is tenderness to palpation of the second toe.  There is minimal redness in the webspace between the first toe and the second toe.  There is increased redness of the dorsum of the foot behind the first and second  toe.  There is slight increase in warmth present.  The area is tender to palpation.  There is fair range of motion of the right ankle.  The Achilles tendon is intact.  There are no lesions or ulcers of the lower portion of the right leg.  There is no calf tenderness.  There is a negative Homans sign.  There is minimal redness of the inner aspect of the fifth toe of the left foot.  Lymphadenopathy:       Head (right side): No submandibular adenopathy present.       Head (left side): No submandibular adenopathy present.    He has no cervical adenopathy.  Neurological: He is alert and oriented to person, place, and time. He has normal strength. No cranial nerve deficit or sensory deficit.  Skin: Skin is warm and dry.  Psychiatric: He has a normal mood and affect. His speech is normal.  Nursing note and vitals reviewed.    ED Treatments / Results  Labs (all labs ordered are listed, but only abnormal results are displayed) Labs Reviewed - No data to display  EKG  EKG Interpretation None       Radiology No results found.  Procedures Procedures (including critical care time)  Medications Ordered in ED Medications - No data to display   Initial Impression / Assessment and Plan / ED Course  I have reviewed the triage vital signs and the nursing notes.  Pertinent labs & imaging results that were available during my care of the patient were reviewed by me and considered in my medical decision making (see chart for details).       Final Clinical Impressions(s) / ED Diagnoses Vital signs reviewed.  Pulse oximetry is 100% on room air.  Within normal limits by my interpretation.  The patient has increased redness of the right second toe.  There is an area of redness just below the webspace between the big toe and the  second toe.  And there is tenderness of both the dorsum of the right foot as well as the second toe.  I am concerned for possible cellulitis.  Pt seen with me by Dr  Lacinda Axon.  Patient will be treated with IV Rocephin and then oral antibiotics at discharge.  The basic metabolic panel shows potassium to be low at 3.3, the calcium is slightly low at 8.8, otherwise the basic metabolic panel is within normal limits.  The complete blood count shows the white blood cell count to be within normal limits.  Platelets are low at 118,000 as expected.  No shift to the left noted. X-ray of the left foot shows osteopenia.  There are changes consistent with an erosive Trujillo.  There is no gas appreciated.  No foreign body noted. The patient reports improvement in pain after pain medication.  The area of redness on the dorsum of the foot was marked with a skin marker.  The patient is to have this rechecked on Monday, November 12.  Prescription for Omnicef, Norco, and Decadron given to the patient.  The patient is to return to the emergency department immediately if any changes, problems, or concerns before he is seen by his specialist.    Final diagnoses:  Cellulitis of foot  Erosive (osteo)Trujillo    ED Discharge Orders        Ordered    HYDROcodone-acetaminophen (NORCO/VICODIN) 5-325 MG tablet  Every 4 hours PRN     07/27/17 1640    dexamethasone (DECADRON) 4 MG tablet  2 times daily with meals     07/27/17 1640    cefdinir (OMNICEF) 300 MG capsule  2 times daily     07/27/17 1641       Lily Kocher, PA-C 07/27/17 1656    [name], MD 07/28/17 1926

## 2017-07-30 ENCOUNTER — Encounter (HOSPITAL_COMMUNITY): Payer: Self-pay

## 2017-07-30 ENCOUNTER — Ambulatory Visit (HOSPITAL_COMMUNITY)
Admission: RE | Admit: 2017-07-30 | Discharge: 2017-07-30 | Disposition: A | Discharge status: Home / Self Care | Payer: Medicare Other | Source: Ambulatory Visit | Attending: Oncology | Admitting: Oncology

## 2017-07-30 ENCOUNTER — Other Ambulatory Visit (HOSPITAL_BASED_OUTPATIENT_CLINIC_OR_DEPARTMENT_OTHER): Payer: Medicare Other

## 2017-07-30 ENCOUNTER — Other Ambulatory Visit: Payer: Self-pay | Admitting: Oncology

## 2017-07-30 DIAGNOSIS — K8689 Other specified diseases of pancreas: Secondary | ICD-10-CM

## 2017-07-30 DIAGNOSIS — N2889 Other specified disorders of kidney and ureter: Secondary | ICD-10-CM

## 2017-07-30 DIAGNOSIS — K5730 Diverticulosis of large intestine without perforation or abscess without bleeding: Secondary | ICD-10-CM | POA: Diagnosis not present

## 2017-07-30 DIAGNOSIS — C642 Malignant neoplasm of left kidney, except renal pelvis: Secondary | ICD-10-CM

## 2017-07-30 DIAGNOSIS — D696 Thrombocytopenia, unspecified: Secondary | ICD-10-CM

## 2017-07-30 DIAGNOSIS — K869 Disease of pancreas, unspecified: Secondary | ICD-10-CM | POA: Insufficient documentation

## 2017-07-30 DIAGNOSIS — I700 Atherosclerosis of aorta: Secondary | ICD-10-CM | POA: Insufficient documentation

## 2017-07-30 DIAGNOSIS — D509 Iron deficiency anemia, unspecified: Secondary | ICD-10-CM | POA: Diagnosis not present

## 2017-07-30 DIAGNOSIS — C649 Malignant neoplasm of unspecified kidney, except renal pelvis: Secondary | ICD-10-CM

## 2017-07-30 LAB — CBC WITH DIFFERENTIAL/PLATELET
BASO%: 0.1 % (ref 0.0–2.0)
BASOS ABS: 0 10*3/uL (ref 0.0–0.1)
EOS%: 0 % (ref 0.0–7.0)
Eosinophils Absolute: 0 10*3/uL (ref 0.0–0.5)
HEMATOCRIT: 32.8 % — AB (ref 38.4–49.9)
HEMOGLOBIN: 10.8 g/dL — AB (ref 13.0–17.1)
LYMPH#: 0.3 10*3/uL — AB (ref 0.9–3.3)
LYMPH%: 4.1 % — ABNORMAL LOW (ref 14.0–49.0)
MCH: 33.7 pg — AB (ref 27.2–33.4)
MCHC: 33.1 g/dL (ref 32.0–36.0)
MCV: 101.7 fL — ABNORMAL HIGH (ref 79.3–98.0)
MONO#: 0.4 10*3/uL (ref 0.1–0.9)
MONO%: 5.5 % (ref 0.0–14.0)
NEUT#: 5.9 10*3/uL (ref 1.5–6.5)
NEUT%: 90.3 % — ABNORMAL HIGH (ref 39.0–75.0)
Platelets: 124 10*3/uL — ABNORMAL LOW (ref 140–400)
RBC: 3.22 10*6/uL — ABNORMAL LOW (ref 4.20–5.82)
RDW: 20.4 % — AB (ref 11.0–14.6)
WBC: 6.6 10*3/uL (ref 4.0–10.3)

## 2017-07-30 LAB — COMPREHENSIVE METABOLIC PANEL
ALBUMIN: 3.1 g/dL — AB (ref 3.5–5.0)
ALK PHOS: 73 U/L (ref 40–150)
ALT: 36 U/L (ref 0–55)
ANION GAP: 7 meq/L (ref 3–11)
AST: 44 U/L — ABNORMAL HIGH (ref 5–34)
BILIRUBIN TOTAL: 0.45 mg/dL (ref 0.20–1.20)
BUN: 21.7 mg/dL (ref 7.0–26.0)
CALCIUM: 8 mg/dL — AB (ref 8.4–10.4)
CO2: 28 meq/L (ref 22–29)
CREATININE: 1.2 mg/dL (ref 0.7–1.3)
Chloride: 104 mEq/L (ref 98–109)
Glucose: 116 mg/dl (ref 70–140)
Potassium: 4.2 mEq/L (ref 3.5–5.1)
Sodium: 139 mEq/L (ref 136–145)
TOTAL PROTEIN: 6.9 g/dL (ref 6.4–8.3)

## 2017-07-30 MED ORDER — IOPAMIDOL (ISOVUE-300) INJECTION 61%
INTRAVENOUS | Status: AC
Start: 2017-07-30 — End: 2017-07-30
  Administered 2017-07-30: 100 mL via INTRAVENOUS
  Filled 2017-07-30: qty 100

## 2017-07-30 MED ORDER — IOPAMIDOL (ISOVUE-300) INJECTION 61%
100.0000 mL | Freq: Once | INTRAVENOUS | Status: AC | PRN
  Administered 2017-07-30: 100 mL via INTRAVENOUS

## 2017-08-02 ENCOUNTER — Ambulatory Visit (HOSPITAL_BASED_OUTPATIENT_CLINIC_OR_DEPARTMENT_OTHER): Payer: Medicare Other | Admitting: Oncology

## 2017-08-02 VITALS — BP 178/77 | HR 75 | Temp 97.9°F | Resp 18 | Ht 71.0 in | Wt 186.3 lb

## 2017-08-02 DIAGNOSIS — D696 Thrombocytopenia, unspecified: Secondary | ICD-10-CM | POA: Diagnosis not present

## 2017-08-02 DIAGNOSIS — C649 Malignant neoplasm of unspecified kidney, except renal pelvis: Secondary | ICD-10-CM

## 2017-08-02 DIAGNOSIS — C642 Malignant neoplasm of left kidney, except renal pelvis: Secondary | ICD-10-CM | POA: Diagnosis present

## 2017-08-02 DIAGNOSIS — D509 Iron deficiency anemia, unspecified: Secondary | ICD-10-CM

## 2017-08-02 DIAGNOSIS — C7889 Secondary malignant neoplasm of other digestive organs: Secondary | ICD-10-CM | POA: Diagnosis not present

## 2017-08-02 NOTE — Progress Notes (Signed)
Hematology and Oncology Follow Up Visit  NATHYN LUIZ [id number] 08/02/17     Principle Diagnosis: 66 year old with the following issues:  1. T1b right kidney cancer diagnosed in 2010. He is S/P rdical nephrectomy on June 27, 2009. The pathological staging was T1b clear cell histology with Fuhrman grade 3/4.  2. Left Kidney mass  Measuring 4.5 x 2 7 x 3.6 irregular enhancing appears to be a new kidney cancer noted in 07/2012. 3. Pancreatic lesion. 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 4. Votrient  800 mg daily started in December 2013. Treatment held in 11/2012 due to increase LFTs. Votrient was resumed in April 2014 at 400 mg daily. His dose was reduced further to 200 mg daily starting in September 2016. Therapy discontinued in June 2017 because of progression of disease.   Current therapy: Cabometyx 40 mg daily started in July 2017. Therapy has been on hold since August of 2017 because of perianal abscess. Therapy was resumed temporarily in December 2017 but discontinued because of relapse in his abscess.  Therapy to be resumed on 12/22/2016.  Interim History: Mr. Rod presents for a follow up visit. Since his last visit, he reports gout flare required a trip to the emergency department earlier this week.  He symptoms have resolved at this time.  He continues to take Cabometyx without any complications. He denied any diarrhea, anorexia or nausea. His appetite remains excellent and his weight is stable. He denied any arthralgias, myalgias. He denied any recent exacerbation of his rectal abscesses.  Continues to be active and attends to activities of daily living.  He has no difficulty obtaining or taking this medication.  He is not having any fevers or chills. He did not report any headaches blurred vision double vision. Does not report any motor sensory neuropathy or alteration of mental status. He does not report any depression or anxiety. He does not report any  frequency urgency or hesitancy. He does not report any other urinary symptoms. Remainder of his review of systems is unremarkable.  Medications: Unchanged by my review today. Current Outpatient Medications  Medication Sig Dispense Refill  . acetaminophen (TYLENOL) 650 MG CR tablet Take 650 mg by mouth every 8 (eight) hours as needed for pain.    . baclofen (LIORESAL) 20 MG tablet Take 20 mg by mouth 2 (two) times daily.    . CABOMETYX 40 MG TABS TAKE 1 TABLET BY MOUTH EVERY DAY ON AN EMPTY STOMACH 1 HOUR BEFORE OR 2 HOURS AFTER A MEAL 30 tablet 0  . cefdinir (OMNICEF) 300 MG capsule Take 1 capsule (300 mg total) 2 (two) times daily by mouth. Take with food 14 capsule 0  . colchicine 0.6 MG tablet Take 0.6 mg by mouth daily.    Marland Kitchen dexamethasone (DECADRON) 4 MG tablet Take 1 tablet (4 mg total) 2 (two) times daily with a meal by mouth. 10 tablet 0  . docusate sodium (COLACE) 100 MG capsule Take 1 capsule (100 mg total) by mouth every 12 (twelve) hours. 60 capsule 0  . HYDROcodone-acetaminophen (NORCO/VICODIN) 5-325 MG tablet Take 1 tablet every 4 (four) hours as needed by mouth. 15 tablet 0  . pantoprazole (PROTONIX) 40 MG tablet Take 1 tablet (40 mg total) by mouth 2 (two) times daily before a meal. 60 tablet 5  . topiramate (TOPAMAX) 25 MG tablet Take 25 mg by mouth 2 (two) times daily.     No current facility-administered medications for this visit.  Allergies:  Allergies  Allergen Reactions  . Nsaids Other (See Comments)    Patient only has one kidney.   . Meperidine Hcl Hives and Rash    Past Medical History, Surgical history, Social history, and Family History were reviewed and updated.   Physical Exam: Blood pressure (!) 178/77, pulse 75, temperature 97.9 F (36.6 C), temperature source Oral, resp. rate 18, height 5\' 11"  (1.803 m), weight 186 lb 4.8 oz (84.5 kg), SpO2 98 %.   ECOG: 1 General appearance: Alert, awake gentleman without distress. Head: No oral ulcers or  lesions. Neck: no masses or lesions. Lymph nodes: Cervical, supraclavicular, and axillary nodes normal.  Heart:regular rate and rhythm, S1, S2. No murmurs or gallops. Lung:chest clear, no wheezing, or dullness to percussion. Abdomen: soft, non-tender, without masses or organomegaly. No ascites. EXT: 1+ edema noted. Neurological exam: no motor or sensory deficits noted. Skin: No rashes or lesions noted. no petechiae or ecchymosis.    CBC    Component Value Date/Time   WBC 6.6 07/30/2017 1004   WBC 5.4 07/27/2017 1555   RBC 3.22 (L) 07/30/2017 1004   RBC 3.49 (L) 07/27/2017 1555   HGB 10.8 (L) 07/30/2017 1004   HCT 32.8 (L) 07/30/2017 1004   PLT 124 (L) 07/30/2017 1004   MCV 101.7 (H) 07/30/2017 1004   MCH 33.7 (H) 07/30/2017 1004   MCH 34.4 (H) 07/27/2017 1555   MCHC 33.1 07/30/2017 1004   MCHC 32.4 07/27/2017 1555   RDW 20.4 (H) 07/30/2017 1004   LYMPHSABS 0.3 (L) 07/30/2017 1004   MONOABS 0.4 07/30/2017 1004   EOSABS 0.0 07/30/2017 1004   BASOSABS 0.0 07/30/2017 1004    EXAM: CT CHEST WITH CONTRAST  CT ABDOMEN AND PELVIS WITH AND WITHOUT CONTRAST  TECHNIQUE: Multidetector CT imaging of the chest was performed during intravenous contrast administration. Multidetector CT imaging of the abdomen and pelvis was performed following the standard protocol before and during bolus administration of intravenous contrast.  CONTRAST:  100 mL of Isovue-300.  COMPARISON:  CT the abdomen and pelvis 11/15/2016. CT chest 08/31/2016.  FINDINGS: CT CHEST FINDINGS  Cardiovascular: Heart size is normal. There is no significant pericardial fluid, thickening or pericardial calcification. Atherosclerosis in the thoracic aorta and great vessels of the mediastinum. No definite coronary artery calcifications.  Mediastinum/Nodes: No pathologically enlarged mediastinal or hilar lymph nodes. Esophagus is unremarkable in appearance. No axillary lymphadenopathy.  Lungs/Pleura:  Areas of chronic scarring are again noted in lung bases bilaterally (left greater than right). No acute consolidative airspace disease. No pleural effusions. No suspicious appearing pulmonary nodules or masses.  Musculoskeletal: Postoperative changes in the lateral aspect of the left sixth rib again noted. There are no aggressive appearing lytic or blastic lesions noted in the visualized portions of the skeleton.  CT ABDOMEN AND PELVIS FINDINGS  Hepatobiliary: Subcentimeter low-attenuation lesion in the central aspect of segment 4A, too small to characterize, but statistically likely a tiny cyst. No other suspicious appearing hepatic lesions are noted. No intra or extrahepatic biliary ductal dilatation. Gallbladder is normal in appearance.  Pancreas: Previously noted lesion in the body of the pancreas has significantly increased in size and currently measures 3.4 x 3.1 x 4.2 cm (axial image 104 of series 11 and coronal image 44 of series 12), predominantly centrally low-attenuation but with evidence of some low level internal enhancement in some regions and a thick septation best appreciated on the coronal image, indicative of a mixed cystic and solid lesion. In addition, there is a  new lesion in the head of the pancreas best appreciated on axial image 123 of series 11 and coronal image 52 of series 12 measuring 2.0 x 1.9 x 2.2 cm demonstrating some internal enhancement. No pancreatic ductal dilatation. No peripancreatic inflammatory changes or fluid collections.  Spleen: Unremarkable.  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 Exophytic 3.9 cm low-attenuation lesion extending off the medial aspect of the upper pole of the left kidney is compatible with a simple cyst. Other subcentimeter low-attenuation lesions in the lower pole of the left kidney are too small to definitively characterize as. 9 mm nodule in the lateral limb of the left adrenal gland is smaller than prior studies, presumably a treated metastatic lesion. Right adrenal gland is normal in appearance. Status post right nephrectomy. No left hydroureteronephrosis. Urinary bladder is normal in appearance.  Stomach/Bowel: Stomach is normal in appearance. No pathologic dilatation of small bowel or colon. Numerous colonic diverticulae are noted, without surrounding inflammatory changes to suggest an acute diverticulitis at this time. Normal appendix. Linear area of soft tissue thickening with internal gas extending from the right perirectal region crossing the levator hiatus extending into the medial aspect of the right perianal region, presumably indicative of an anorectal fistula. This is best appreciated on axial images 197-216 of series 11 and coronal images 70-76 of series 12.  Vascular/Lymphatic: No significant atherosclerotic disease, aneurysm or dissection noted in the abdominal or pelvic vasculature. There is an apparent filling defect extending from the left superficial femoral vein into the left common femoral vein, concerning for potential deep venous thrombosis (although this may simply be timing related flow artifact). No lymphadenopathy noted in the abdomen or pelvis.  Reproductive: Prostate gland and seminal vesicles are unremarkable in appearance.  Other: No significant volume of ascites.  No pneumoperitoneum.  Musculoskeletal: There are no aggressive appearing lytic or blastic lesions noted in the visualized portions of the  skeleton.  IMPRESSION: 1. 4.4 x 5.2 x 4.8 cm hypervascular left renal mass centered in the upper pole of the left kidney with extension into the left renal hilum and invasion into the left renal vein, as discussed above, compatible with known left-sided renal cell carcinoma. No associated extension beyond Gerota's fascia and no lymphadenopathy. Previously noted left adrenal nodule has significantly decreased in size, suggesting a partially treated metastatic lesion. However, the previously noted lesion in the mid body of the pancreas has increased in size, and there is a new lesion in the head of the pancreas, both of which are concerning for metastatic lesions, as discussed above. 2. Potential filling defect in the left superficial femoral vein and common femoral vein while this may simply be artifactual related to flow artifact, clinical correlation is recommended and if there is any clinical concern for deep venous thrombosis, further evaluation with lower extremity Doppler ultrasound should be considered. 3. 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 4. Colonic diverticulosis without evidence of acute diverticulitis at this time. 5. Aortic atherosclerosis. 6. Additional incidental findings, as above. Aortic Atherosclerosis (ICD10-I70.0).     Impression and  Plan:   66 year old gentleman with the following issues:   1. A history of renal cell carcinoma. He had stage T1b diagnosed in October 2010 of the right kidney, status post right nephrectomy. He also has left kidney cancer and pancreatic metastasis. He was initially started on Votrient in December 2013. He had dose reduction on few occasions. The patient currently on Votrient 200 mg daily since September 2016 dose reduction.  CT scan obtained on 02/26/2016 showed slow progression of disease especially in his left renal mass.   He is currently on Cabometyx which was resumed in April 2018.   CT scan  obtained 07/30/2017 was reviewed and discussed with the patient.  His disease appears to be relatively stable the plan is to continue with the same dose and schedule.  He has no complications related to this medication and has no objections to continue.   2. Pancreatic lesion. Biopsy proven to be malignant due to renal cell cancer metastasis. Repeat imaging studies showed slight progression of disease continued therapy.  3. Thrombocytopenia: Unchanged without any bleeding complications.  4. Anemia.  Related to iron deficiency as well as renal insufficiency. Hemoglobin is stable without any transfusion.  5. Increase LFTs:  We will continue to monitor this moving forward. LFTs from July 30, 2017 range.  6. Perirectal abscess: Ccontinues to follow with Dr. Marcello Moores. Resolved without any recent complications at this time.  7. Follow-up:  In 8 weeks.   [name] MD 08/02/17

## 2017-08-26 ENCOUNTER — Encounter (HOSPITAL_COMMUNITY): Payer: Self-pay | Admitting: *Deleted

## 2017-08-26 ENCOUNTER — Other Ambulatory Visit: Payer: Self-pay

## 2017-08-26 ENCOUNTER — Emergency Department (HOSPITAL_COMMUNITY)
Admission: EM | Admit: 2017-08-26 | Discharge: 2017-08-26 | Disposition: A | Discharge status: Home / Self Care | Payer: Medicare Other | Attending: Emergency Medicine | Admitting: Emergency Medicine

## 2017-08-26 DIAGNOSIS — M10072 Idiopathic gout, left ankle and foot: Secondary | ICD-10-CM | POA: Insufficient documentation

## 2017-08-26 DIAGNOSIS — Z87891 Personal history of nicotine dependence: Secondary | ICD-10-CM | POA: Insufficient documentation

## 2017-08-26 DIAGNOSIS — Z79899 Other long term (current) drug therapy: Secondary | ICD-10-CM | POA: Diagnosis not present

## 2017-08-26 DIAGNOSIS — N183 Chronic kidney disease, stage 3 (moderate): Secondary | ICD-10-CM | POA: Insufficient documentation

## 2017-08-26 DIAGNOSIS — M79675 Pain in left toe(s): Secondary | ICD-10-CM | POA: Diagnosis present

## 2017-08-26 DIAGNOSIS — M109 Gout, unspecified: Secondary | ICD-10-CM

## 2017-08-26 MED ORDER — PREDNISONE 20 MG PO TABS: ORAL_TABLET | ORAL | 0 refills | Status: DC

## 2017-08-26 MED ORDER — DEXAMETHASONE SODIUM PHOSPHATE 10 MG/ML IJ SOLN
10.0000 mg | Freq: Once | INTRAMUSCULAR | Status: AC
Start: 2017-08-26 — End: 2017-08-26
  Administered 2017-08-26: 10 mg via INTRAMUSCULAR
  Filled 2017-08-26: qty 1

## 2017-08-26 NOTE — ED Triage Notes (Signed)
Pt c/o left great toe pain that started yesterday; pt has a hx of gout

## 2017-08-26 NOTE — ED Provider Notes (Signed)
[facility] EMERGENCY DEPARTMENT Provider Note   CSN: [id number] Arrival date & time: 08/26/17  5701  Time seen 04:20 AM   History   Chief Complaint Chief Complaint  Patient presents with  . Toe Pain    HPI Jeffrey Trujillo is a 66 y.o. male.  HPI patient has a history of gout and states he cannot take allopurinol due to some renal problems.  He states his left great toe started hurting yesterday.  He states he has had it in both toes in the past.  He states this feels just like the gout attacks he has had before.  He states it is a stinging and burning sensation.  He denies any fevers.  Patient's wife is in the hospital upstairs for the past 2 days after having surgery.  He denies any injury.  He does admit to drinking on the weekends.  He states his birthday is today.  PCP [facility]   Past Medical History:  Diagnosis Date  . Anal fistula   . Anemia in chronic renal disease   . Barrett's esophagus    by EGD 11/02/2009  . Chronic pain   . CKD (chronic kidney disease), stage III ([place])   . Diverticulosis of colon    by colonoscopy 11/02/2009  . DJD (degenerative joint disease) of cervical spine   . GERD (gastroesophageal reflux disease)   . Gouty arthritis    per pt on 05-16-2016 stable (last bout bilatearl wrist 03/ 2017)  . History of diverticulitis of colon    w/ small perforation , no surgical intervention 06/ 2014  . History of peptic ulcer disease   . Iron deficiency anemia due to chronic blood loss   . Metastatic renal cell carcinoma [facility]) oncologist-  dr Alen Blew--  currently on oral chemo -- RCC w/ mets to pancreas slow progression per last ct 06/ 2017   dx 2010 right renal cell carcinoma (pT1b, Fuhrman grade 3/4) s/p  radical nephrectomy 06-27-2009/  dx left renal cell carcinoma w/ mets to pancreas 11/ 2013    . Nocturia   . Thrombocytopenia ([place])    secondary to oral chemo  . Wears glasses     Patient Active Problem List   Diagnosis Date Noted  . History of renal cell cancer   . Chronic neck pain   . Goals of care, counseling/discussion   . Palliative care encounter   . Encephalopathy acute 11/15/2016  . Fever 12/07/2015  . Gouty arthritis 12/07/2015  . Fall   . Pyrexia   . Acute gout 11/15/2015  . Perirectal abscess 11/12/2015  . Hypokalemia 11/12/2015  . Anemia of chronic disease 11/12/2015  . Gallstones   . Sinus bradycardia 02/14/2015  . Malnutrition of moderate degree ([place]) 02/12/2015  . Elevated LFTs 02/12/2015  . Abdominal pain, right upper quadrant 02/12/2015  . Acute kidney injury ([place]) 02/12/2015  . Cholecystitis 02/11/2015  . Hiatal hernia   . Chest pain 04/15/2014  . PUD (peptic ulcer disease) 04/21/2013  . Melena 03/10/2013  . Abdominal pain 03/10/2013  . Leukopenia 03/10/2013  . Thrombocytopenia ([place]) 03/10/2013  . Abdominal abscess 03/10/2013  . Anemia 03/10/2013  . ETOH abuse 03/10/2013  . Bradycardia 03/10/2013  . Barrett's esophagus 01/04/2013  . Renal cell cancer ([place]) 09/25/2012  . Lower GI bleed 09/25/2012  . Anemia due to blood loss 09/25/2012  . Acute gout of right foot 09/25/2012  . DJD (degenerative joint disease) 09/25/2012  . Pancreatic mass 08/04/2012  .  Acute strain of neck muscle 06/05/2011  . RECTAL BLEEDING 10/20/2009  . NAUSEA WITH VOMITING 10/20/2009  . DIARRHEA, BLOODY 10/20/2009    Past Surgical History:  Procedure Laterality Date  . COLONOSCOPY  Feb 2011   [name]: single anal papilla, pan-colonic diverticula  . COLONOSCOPY  09/25/2012   RMR: Colonic diverticulosis. Suspect diverticular bleeding-hepatic flexure tic-sealed  . ESOPHAGOGASTRODUODENOSCOPY  Feb 2011   [name]: salmon-colored epithelium consistent with Barrett's, nodular antral erosions, small hiatal hernia, negative H.pylori  . ESOPHAGOGASTRODUODENOSCOPY  09/25/2012   SWF:UXNATFT'D esophagus. Small hiatal hernia. Antral erosions of doubtful clinical significance, no biopsies  .  ESOPHAGOGASTRODUODENOSCOPY N/A 03/11/2013   SLF: Barrett's esophagus/Single ulcer in the gastric antrum-PROBABLE SOURCE FOR BLACK STOOL/ Chronic gastritis in the gastric antrum  . ESOPHAGOGASTRODUODENOSCOPY N/A 10/11/2014   Dr. [name] barrett's s/p biopsy. Hiatal hernia. Biopsy with intestinal metaplasia, glandular atypia indefinite for low grade dysplasia noted as well  . ESOPHAGOGASTRODUODENOSCOPY N/A 01/19/2015   [name]: Barrett's no dysplasia  . EUS  08/07/2012   Procedure: UPPER ENDOSCOPIC ULTRASOUND (EUS) LINEAR;  Surgeon: [name], MD;  Location: [facility] ENDOSCOPY;  Service: Endoscopy;  Laterality: N/A;  . EUS  08/21/2012   Procedure: UPPER ENDOSCOPIC ULTRASOUND (EUS) LINEAR;  Surgeon: [name], MD;  Location: [facility] ENDOSCOPY;  Service: Endoscopy;  Laterality: N/A;  . INCISION AND DRAINAGE ABSCESS N/A 11/13/2015   Procedure: INCISION AND DRAINAGE ABSCESS;  Surgeon: Aviva Signs, MD;  Location: [facility] ORS;  Service: General;  Laterality: N/A;  . INGUINAL HERNIA REPAIR Bilateral 1990's  . LAPAROSCOPIC NEPHRECTOMY Right 06/27/2009   dr Alinda Money  . LUNG REMOVAL, PARTIAL  1970   left lower lobe, benign       Home Medications    Prior to Admission medications   Medication Sig Start Date End Date Taking? Authorizing Provider  acetaminophen (TYLENOL) 650 MG CR tablet Take 650 mg by mouth every 8 (eight) hours as needed for pain.    [provider]  baclofen (LIORESAL) 20 MG tablet Take 20 mg by mouth 2 (two) times daily.    [provider]  CABOMETYX 40 MG TABS TAKE 1 TABLET BY MOUTH EVERY DAY ON AN EMPTY STOMACH 1 HOUR BEFORE OR 2 HOURS AFTER A MEAL 07/09/17   [name], MD  cefdinir (OMNICEF) 300 MG capsule Take 1 capsule (300 mg total) 2 (two) times daily by mouth. Take with food 07/27/17   Lily Kocher, PA-C  colchicine 0.6 MG tablet Take 0.6 mg by mouth daily.    [provider]  dexamethasone (DECADRON) 4 MG tablet Take 1 tablet (4 mg  total) 2 (two) times daily with a meal by mouth. 07/27/17   Lily Kocher, PA-C  docusate sodium (COLACE) 100 MG capsule Take 1 capsule (100 mg total) by mouth every 12 (twelve) hours. 02/26/16   Mesner, Corene Cornea, MD  HYDROcodone-acetaminophen (NORCO/VICODIN) 5-325 MG tablet Take 1 tablet every 4 (four) hours as needed by mouth. 07/27/17   Lily Kocher, PA-C  pantoprazole (PROTONIX) 40 MG tablet Take 1 tablet (40 mg total) by mouth 2 (two) times daily before a meal. 04/12/16   Annitta Needs, NP  predniSONE (DELTASONE) 20 MG tablet Take 3 po QD x 3d , then 2 po QD x 3d then 1 po QD x 3d 08/26/17   Rolland Porter, MD  topiramate (TOPAMAX) 25 MG tablet Take 25 mg by mouth 2 (two) times daily.    [provider]    Family History Family  History  Problem Relation Age of Onset  . Lung cancer Brother   . Prostate cancer Brother   . Lumbar disc disease Brother   . Prostate cancer Brother   . Colon cancer Neg Hx     Social History Social History   Tobacco Use  . Smoking status: Former Smoker    Packs/day: 0.25    Years: 4.00    Pack years: 1.00    Types: Cigars    Last attempt to quit: 05/16/2014    Years since quitting: 3.2  . Smokeless tobacco: Current User    Types: Chew  Substance Use Topics  . Alcohol use: Yes    Alcohol/week: 2.4 - 3.0 oz    Types: 2 - 3 Cans of beer, 2 Shots of liquor per week    Comment: binges intermittently, 1/5 vodka daily until it makes him sick, last ETOH was about 3 months ago  . Drug use: No  lives at home Lives with spouse   Allergies   Amoxicillin; Nsaids; Zofran [ondansetron hcl]; and Demerol [meperidine]   Review of Systems Review of Systems  All other systems reviewed and are negative.    Physical Exam Updated Vital Signs BP (!) 166/98 (BP Location: Left Arm)   Pulse 99   Temp 97.9 F (36.6 C) (Oral)   Resp 20   Ht 5\' 11"  (1.803 m)   Wt 83.9 kg (185 lb)   SpO2 100%   BMI 25.80 kg/m   Vital signs normal    Physical Exam    Constitutional: He is oriented to person, place, and time. He appears well-developed and well-nourished.  Non-toxic appearance. He does not appear ill. No distress.  HENT:  Head: Normocephalic.  Right Ear: External ear normal.  Left Ear: External ear normal.  Nose: Nose normal.  Eyes: Conjunctivae and EOM are normal.  Neck: Normal range of motion and full passive range of motion without pain.  Cardiovascular: Normal rate.  Pulmonary/Chest: Effort normal. No respiratory distress. He has no rhonchi. He exhibits no crepitus.  Abdominal: Normal appearance.  Musculoskeletal: Normal range of motion. He exhibits edema and tenderness.  Moves all extremities well.  Patient is noted to have some diffuse redness and mild swelling of his left great toe.  He is very tender to palpation over the MCP joint and then also in the toe itself.  He does not have pain to palpation in the foot, ankle, or knee.  The symptoms are consistent with acute gout.  Neurological: He is alert and oriented to person, place, and time. He has normal strength. No cranial nerve deficit.  Skin: Skin is warm, dry and intact. No rash noted. There is erythema. No pallor.  Psychiatric: He has a normal mood and affect. His speech is normal and behavior is normal. His mood appears not anxious.  Nursing note and vitals reviewed.    ED Treatments / Results  Labs (all labs ordered are listed, but only abnormal results are displayed) Labs Reviewed - No data to display  EKG  EKG Interpretation None       Radiology No results found.  Procedures Procedures (including critical care time)  Medications Ordered in ED Medications  dexamethasone (DECADRON) injection 10 mg (not administered)     Initial Impression / Assessment and Plan / ED Course  I have reviewed the triage vital signs and the nursing notes.  Pertinent labs & imaging results that were available during my care of the patient were reviewed by me and  considered  in my medical decision making (see chart for details).    When I review his chart he has had elevated uric acid levels in the past.  Patient states he cannot go on allopurinol because of kidney problems.  Therefore he was treated with steroids.  Final Clinical Impressions(s) / ED Diagnoses   Final diagnoses:  Acute gout involving toe of left foot, unspecified cause    ED Discharge Orders        Ordered    predniSONE (DELTASONE) 20 MG tablet     08/26/17 0435     Plan discharge  Rolland Porter, MD, Barbette Or, MD 08/26/17 [phone]

## 2017-08-26 NOTE — Discharge Instructions (Signed)
Take the prednisone as prescribed. YOU NEED TO STOP EATING AND DRINKING THINGS THAT MAKE YOUR GOUT FLARE UP!!  HAPPY BIRTHDAY!!!

## 2017-09-13 ENCOUNTER — Other Ambulatory Visit: Payer: Self-pay | Admitting: *Deleted

## 2017-09-13 MED ORDER — CABOZANTINIB S-MALATE 40 MG PO TABS: 1.0000 | ORAL_TABLET | Freq: Every day | ORAL | 0 refills | Status: DC

## 2017-09-27 ENCOUNTER — Ambulatory Visit: Payer: Medicare Other | Admitting: Oncology

## 2017-09-27 ENCOUNTER — Other Ambulatory Visit: Payer: Medicare Other

## 2017-09-29 ENCOUNTER — Emergency Department (HOSPITAL_COMMUNITY)
Admission: EM | Admit: 2017-09-29 | Discharge: 2017-09-29 | Disposition: A | Discharge status: Home / Self Care | Payer: Medicare Other | Attending: Emergency Medicine | Admitting: Emergency Medicine

## 2017-09-29 ENCOUNTER — Encounter (HOSPITAL_COMMUNITY): Payer: Self-pay | Admitting: Emergency Medicine

## 2017-09-29 ENCOUNTER — Other Ambulatory Visit: Payer: Self-pay

## 2017-09-29 DIAGNOSIS — Z85528 Personal history of other malignant neoplasm of kidney: Secondary | ICD-10-CM | POA: Insufficient documentation

## 2017-09-29 DIAGNOSIS — M79662 Pain in left lower leg: Secondary | ICD-10-CM | POA: Insufficient documentation

## 2017-09-29 DIAGNOSIS — N183 Chronic kidney disease, stage 3 (moderate): Secondary | ICD-10-CM | POA: Insufficient documentation

## 2017-09-29 DIAGNOSIS — M79605 Pain in left leg: Secondary | ICD-10-CM

## 2017-09-29 DIAGNOSIS — M79661 Pain in right lower leg: Secondary | ICD-10-CM | POA: Insufficient documentation

## 2017-09-29 DIAGNOSIS — Z79899 Other long term (current) drug therapy: Secondary | ICD-10-CM | POA: Insufficient documentation

## 2017-09-29 DIAGNOSIS — Z87891 Personal history of nicotine dependence: Secondary | ICD-10-CM | POA: Diagnosis not present

## 2017-09-29 DIAGNOSIS — R600 Localized edema: Secondary | ICD-10-CM | POA: Diagnosis present

## 2017-09-29 DIAGNOSIS — R770 Abnormality of albumin: Secondary | ICD-10-CM | POA: Insufficient documentation

## 2017-09-29 DIAGNOSIS — M79604 Pain in right leg: Secondary | ICD-10-CM

## 2017-09-29 LAB — CBC WITH DIFFERENTIAL/PLATELET
BASOS ABS: 0 10*3/uL (ref 0.0–0.1)
Basophils Relative: 0 %
EOS PCT: 1 %
Eosinophils Absolute: 0.1 10*3/uL (ref 0.0–0.7)
HCT: 29.6 % — ABNORMAL LOW (ref 39.0–52.0)
Hemoglobin: 9.5 g/dL — ABNORMAL LOW (ref 13.0–17.0)
Lymphocytes Relative: 10 %
Lymphs Abs: 0.4 10*3/uL — ABNORMAL LOW (ref 0.7–4.0)
MCH: 33.7 pg (ref 26.0–34.0)
MCHC: 32.1 g/dL (ref 30.0–36.0)
MCV: 105 fL — AB (ref 78.0–100.0)
MONO ABS: 0.3 10*3/uL (ref 0.1–1.0)
Monocytes Relative: 8 %
Neutro Abs: 3.4 10*3/uL (ref 1.7–7.7)
Neutrophils Relative %: 81 %
PLATELETS: 187 10*3/uL (ref 150–400)
RBC: 2.82 MIL/uL — ABNORMAL LOW (ref 4.22–5.81)
RDW: 17 % — AB (ref 11.5–15.5)
WBC: 4.2 10*3/uL (ref 4.0–10.5)

## 2017-09-29 LAB — COMPREHENSIVE METABOLIC PANEL
ALBUMIN: 2.7 g/dL — AB (ref 3.5–5.0)
ALK PHOS: 61 U/L (ref 38–126)
ALT: 29 U/L (ref 17–63)
AST: 24 U/L (ref 15–41)
Anion gap: 8 (ref 5–15)
BILIRUBIN TOTAL: 0.5 mg/dL (ref 0.3–1.2)
BUN: 17 mg/dL (ref 6–20)
CALCIUM: 8.2 mg/dL — AB (ref 8.9–10.3)
CO2: 25 mmol/L (ref 22–32)
Chloride: 105 mmol/L (ref 101–111)
Creatinine, Ser: 1.31 mg/dL — ABNORMAL HIGH (ref 0.61–1.24)
GFR calc Af Amer: >60 mL/min (ref >60)
GFR calc non Af Amer: 55 mL/min — ABNORMAL LOW (ref >60)
GLUCOSE: 101 mg/dL — AB (ref 65–99)
POTASSIUM: 3.7 mmol/L (ref 3.5–5.1)
Sodium: 138 mmol/L (ref 135–145)
TOTAL PROTEIN: 5.8 g/dL — AB (ref 6.5–8.1)

## 2017-09-29 LAB — URINALYSIS, ROUTINE W REFLEX MICROSCOPIC
BILIRUBIN URINE: NEGATIVE
GLUCOSE, UA: NEGATIVE mg/dL
HGB URINE DIPSTICK: NEGATIVE
KETONES UR: NEGATIVE mg/dL
LEUKOCYTES UA: NEGATIVE
Nitrite: NEGATIVE
Protein, ur: NEGATIVE mg/dL
Specific Gravity, Urine: 1.013 (ref 1.005–1.030)
pH: 7 (ref 5.0–8.0)

## 2017-09-29 LAB — BRAIN NATRIURETIC PEPTIDE: B Natriuretic Peptide: 58 pg/mL (ref 0.0–100.0)

## 2017-09-29 LAB — URIC ACID: Uric Acid, Serum: 8.7 mg/dL — ABNORMAL HIGH (ref 4.4–7.6)

## 2017-09-29 MED ORDER — MORPHINE SULFATE (PF) 4 MG/ML IV SOLN: 4.0000 mg | Freq: Once | INTRAVENOUS | Status: DC

## 2017-09-29 MED ORDER — HYDROCODONE-ACETAMINOPHEN 5-325 MG PO TABS: 1.0000 | ORAL_TABLET | ORAL | 0 refills | Status: DC | PRN

## 2017-09-29 MED ORDER — ALLOPURINOL 100 MG PO TABS: 50.0000 mg | ORAL_TABLET | Freq: Two times a day (BID) | ORAL | 0 refills | Status: AC

## 2017-09-29 MED ORDER — MORPHINE SULFATE (PF) 4 MG/ML IV SOLN
4.0000 mg | Freq: Once | INTRAVENOUS | Status: AC
  Administered 2017-09-29: 4 mg via INTRAMUSCULAR
  Filled 2017-09-29: qty 1

## 2017-09-29 MED ORDER — PREDNISONE 10 MG PO TABS
60.0000 mg | ORAL_TABLET | ORAL | Status: AC
  Administered 2017-09-29: 15:00:00 60 mg via ORAL
  Filled 2017-09-29: qty 1

## 2017-09-29 MED ORDER — PREDNISONE 20 MG PO TABS: 40.0000 mg | ORAL_TABLET | Freq: Every day | ORAL | 0 refills | Status: DC

## 2017-09-29 MED ORDER — ALLOPURINOL 100 MG PO TABS
50.0000 mg | ORAL_TABLET | Freq: Once | ORAL | Status: AC
  Administered 2017-09-29: 50 mg via ORAL
  Filled 2017-09-29: qty 0.5

## 2017-09-29 NOTE — ED Triage Notes (Signed)
Pt reports bilateral feet swelling X1 week, was seen [facility] on Thursday for this and was given a medication, believes it was lasix. He took one dose but has not taken any more. Denies SOB, CP, or GU sx.

## 2017-09-29 NOTE — ED Provider Notes (Signed)
[facility] EMERGENCY DEPARTMENT Provider Note   CSN: [id number] Arrival date & time: 09/29/17  1133     History   Chief Complaint Chief Complaint  Patient presents with  . Foot Swelling    HPI LADARIUS SEUBERT is a 67 y.o. male.  HPI  Patient presents with concern of bilateral lower extremity swelling and pain. Patient has multiple medical issues including history of gout, chronic kidney disease, prior nephrectomy. Has not had a URI-like illness 1 week ago for which he took a course of prednisone. This illness, lower extremity pain and swelling, began soon after his URI. He saw his physician, was started on Lyrica, but notes that it made his stomach upset, this caused nausea, diarrhea and he only took 2 days of that medication. Since that time no additional medication for relief. Pain is worse with any ambulation, application of pressure, or activity. Pain is symmetric in both lower extremities, though the swelling is more pronounced in the right lower extremity. He denies other new complaints, states that his URI illness has resolved.  Past Medical History:  Diagnosis Date  . Anal fistula   . Anemia in chronic renal disease   . Barrett's esophagus    by EGD 11/02/2009  . Chronic pain   . CKD (chronic kidney disease), stage III ([place])   . Diverticulosis of colon    by colonoscopy 11/02/2009  . DJD (degenerative joint disease) of cervical spine   . GERD (gastroesophageal reflux disease)   . Gouty arthritis    per pt on 05-16-2016 stable (last bout bilatearl wrist 03/ 2017)  . History of diverticulitis of colon    w/ small perforation , no surgical intervention 06/ 2014  . History of peptic ulcer disease   . Iron deficiency anemia due to chronic blood loss   . Metastatic renal cell carcinoma [facility]) oncologist-  dr Alen Blew--  currently on oral chemo -- RCC w/ mets to pancreas slow progression per last ct 06/ 2017   dx 2010 right renal cell carcinoma (pT1b, Fuhrman grade  3/4) s/p  radical nephrectomy 06-27-2009/  dx left renal cell carcinoma w/ mets to pancreas 11/ 2013    . Nocturia   . Thrombocytopenia ([place])    secondary to oral chemo  . Wears glasses     Patient Active Problem List   Diagnosis Date Noted  . History of renal cell cancer   . Chronic neck pain   . Goals of care, counseling/discussion   . Palliative care encounter   . Encephalopathy acute 11/15/2016  . Fever 12/07/2015  . Gouty arthritis 12/07/2015  . Fall   . Pyrexia   . Acute gout 11/15/2015  . Perirectal abscess 11/12/2015  . Hypokalemia 11/12/2015  . Anemia of chronic disease 11/12/2015  . Gallstones   . Sinus bradycardia 02/14/2015  . Malnutrition of moderate degree ([place]) 02/12/2015  . Elevated LFTs 02/12/2015  . Abdominal pain, right upper quadrant 02/12/2015  . Acute kidney injury ([place]) 02/12/2015  . Cholecystitis 02/11/2015  . Hiatal hernia   . Chest pain 04/15/2014  . PUD (peptic ulcer disease) 04/21/2013  . Melena 03/10/2013  . Abdominal pain 03/10/2013  . Leukopenia 03/10/2013  . Thrombocytopenia ([place]) 03/10/2013  . Abdominal abscess 03/10/2013  . Anemia 03/10/2013  . ETOH abuse 03/10/2013  . Bradycardia 03/10/2013  . Barrett's esophagus 01/04/2013  . Renal cell cancer ([place]) 09/25/2012  . Lower GI bleed 09/25/2012  . Anemia due to blood loss 09/25/2012  . Acute gout of  right foot 09/25/2012  . DJD (degenerative joint disease) 09/25/2012  . Pancreatic mass 08/04/2012  . Acute strain of neck muscle 06/05/2011  . RECTAL BLEEDING 10/20/2009  . NAUSEA WITH VOMITING 10/20/2009  . DIARRHEA, BLOODY 10/20/2009    Past Surgical History:  Procedure Laterality Date  . COLONOSCOPY  Feb 2011   [name]: single anal papilla, pan-colonic diverticula  . COLONOSCOPY  09/25/2012   RMR: Colonic diverticulosis. Suspect diverticular bleeding-hepatic flexure tic-sealed  . ESOPHAGOGASTRODUODENOSCOPY  Feb 2011   [name]: salmon-colored epithelium consistent with  Barrett's, nodular antral erosions, small hiatal hernia, negative H.pylori  . ESOPHAGOGASTRODUODENOSCOPY  09/25/2012   HCW:CBJSEGB'T esophagus. Small hiatal hernia. Antral erosions of doubtful clinical significance, no biopsies  . ESOPHAGOGASTRODUODENOSCOPY N/A 03/11/2013   SLF: Barrett's esophagus/Single ulcer in the gastric antrum-PROBABLE SOURCE FOR BLACK STOOL/ Chronic gastritis in the gastric antrum  . ESOPHAGOGASTRODUODENOSCOPY N/A 10/11/2014   Dr. [name] barrett's s/p biopsy. Hiatal hernia. Biopsy with intestinal metaplasia, glandular atypia indefinite for low grade dysplasia noted as well  . ESOPHAGOGASTRODUODENOSCOPY N/A 01/19/2015   [name]: Barrett's no dysplasia  . EUS  08/07/2012   Procedure: UPPER ENDOSCOPIC ULTRASOUND (EUS) LINEAR;  Surgeon: [name], MD;  Location: [facility] ENDOSCOPY;  Service: Endoscopy;  Laterality: N/A;  . EUS  08/21/2012   Procedure: UPPER ENDOSCOPIC ULTRASOUND (EUS) LINEAR;  Surgeon: [name], MD;  Location: [facility] ENDOSCOPY;  Service: Endoscopy;  Laterality: N/A;  . INCISION AND DRAINAGE ABSCESS N/A 11/13/2015   Procedure: INCISION AND DRAINAGE ABSCESS;  Surgeon: Aviva Signs, MD;  Location: [facility] ORS;  Service: General;  Laterality: N/A;  . INGUINAL HERNIA REPAIR Bilateral 1990's  . LAPAROSCOPIC NEPHRECTOMY Right 06/27/2009   dr Alinda Money  . LUNG REMOVAL, PARTIAL  1970   left lower lobe, benign       Home Medications    Prior to Admission medications   Medication Sig Start Date End Date Taking? Authorizing Provider  acetaminophen (TYLENOL) 650 MG CR tablet Take 650 mg by mouth every 8 (eight) hours as needed for pain.    [provider]  baclofen (LIORESAL) 20 MG tablet Take 20 mg by mouth 2 (two) times daily.    [provider]  cabozantinib S-Malate (CABOMETYX) 40 MG TABS Take 1 tablet by mouth daily. On an empty stomach 1 hour before or 2 hours after a meal. 09/13/17   Shadad, Mathis Dad, MD  cefdinir (OMNICEF) 300 MG capsule  Take 1 capsule (300 mg total) 2 (two) times daily by mouth. Take with food 07/27/17   Lily Kocher, PA-C  colchicine 0.6 MG tablet Take 0.6 mg by mouth daily.    [provider]  dexamethasone (DECADRON) 4 MG tablet Take 1 tablet (4 mg total) 2 (two) times daily with a meal by mouth. 07/27/17   Lily Kocher, PA-C  docusate sodium (COLACE) 100 MG capsule Take 1 capsule (100 mg total) by mouth every 12 (twelve) hours. 02/26/16   Mesner, Corene Cornea, MD  HYDROcodone-acetaminophen (NORCO/VICODIN) 5-325 MG tablet Take 1 tablet every 4 (four) hours as needed by mouth. 07/27/17   Lily Kocher, PA-C  pantoprazole (PROTONIX) 40 MG tablet Take 1 tablet (40 mg total) by mouth 2 (two) times daily before a meal. 04/12/16   Annitta Needs, NP  predniSONE (DELTASONE) 20 MG tablet Take 3 po QD x 3d , then 2 po QD x 3d then 1 po QD x 3d 08/26/17   Rolland Porter, MD  topiramate (TOPAMAX) 25 MG tablet Take 25 mg  by mouth 2 (two) times daily.    [provider]    Family History Family History  Problem Relation Age of Onset  . Lung cancer Brother   . Prostate cancer Brother   . Lumbar disc disease Brother   . Prostate cancer Brother   . Colon cancer Neg Hx     Social History Social History   Tobacco Use  . Smoking status: Former Smoker    Packs/day: 0.25    Years: 4.00    Pack years: 1.00    Types: Cigars    Last attempt to quit: 05/16/2014    Years since quitting: 3.3  . Smokeless tobacco: Current User    Types: Chew  Substance Use Topics  . Alcohol use: Yes    Alcohol/week: 2.4 - 3.0 oz    Types: 2 - 3 Cans of beer, 2 Shots of liquor per week    Comment: binges intermittently, 1/5 vodka daily until it makes him sick, last ETOH was about 3 months ago  . Drug use: No     Allergies   Amoxicillin; Nsaids; Zofran [ondansetron hcl]; and Demerol [meperidine]   Review of Systems Review of Systems  Constitutional:       Per HPI, otherwise negative  HENT:       Per HPI, otherwise  negative  Respiratory:       Per HPI, otherwise negative  Cardiovascular:       Per HPI, otherwise negative  Gastrointestinal: Negative for vomiting.  Endocrine:       Negative aside from HPI  Genitourinary:       Neg aside from HPI   Musculoskeletal:       Per HPI, otherwise negative  Skin: Positive for color change.  Allergic/Immunologic: Positive for immunocompromised state.  Neurological: Negative for syncope.     Physical Exam Updated Vital Signs BP (!) 180/86 (BP Location: Right Arm)   Pulse 73   Temp 97.8 F (36.6 C) (Oral)   Resp 18   Ht 5\' 11"  (1.803 m)   Wt 84.4 kg (186 lb)   SpO2 100%   BMI 25.94 kg/m   Physical Exam  Constitutional: He is oriented to person, place, and time. He has a sickly appearance. No distress.  HENT:  Head: Normocephalic and atraumatic.  Eyes: Conjunctivae and EOM are normal.  Cardiovascular: Normal rate, regular rhythm and intact distal pulses.  Pulmonary/Chest: Effort normal. No stridor. No respiratory distress.  Abdominal: He exhibits no distension.  Musculoskeletal: He exhibits no edema.  Bilateral pitting lower extremity edema, right greater than left. No gross deformity. Color is slightly pale, but essentially normal. No inability to move the ankles, toes, but pain with motion.   Neurological: He is alert and oriented to person, place, and time.  Skin: Skin is warm and dry.  Psychiatric: He has a normal mood and affect.  Nursing note and vitals reviewed.    ED Treatments / Results  Labs (all labs ordered are listed, but only abnormal results are displayed) Labs Reviewed  COMPREHENSIVE METABOLIC PANEL - Abnormal; Notable for the following components:      Result Value   Glucose, Bld 101 (*)    Creatinine, Ser 1.31 (*)    Calcium 8.2 (*)    Total Protein 5.8 (*)    Albumin 2.7 (*)    GFR calc non Af Amer 55 (*)    All other components within normal limits  CBC WITH DIFFERENTIAL/PLATELET - Abnormal; Notable for the  following  components:   RBC 2.82 (*)    Hemoglobin 9.5 (*)    HCT 29.6 (*)    MCV 105.0 (*)    RDW 17.0 (*)    Lymphs Abs 0.4 (*)    All other components within normal limits  URIC ACID - Abnormal; Notable for the following components:   Uric Acid, Serum 8.7 (*)    All other components within normal limits  BRAIN NATRIURETIC PEPTIDE  URINALYSIS, ROUTINE W REFLEX MICROSCOPIC     Procedures Procedures (including critical care time)  Medications Ordered in ED Medications - No data to display   Initial Impression / Assessment and Plan / ED Course  I have reviewed the triage vital signs and the nursing notes.  Pertinent labs & imaging results that were available during my care of the patient were reviewed by me and considered in my medical decision making (see chart for details).   2:23 PM On repeat exam the patient is awake and alert. Discussed all findings thus far, including evidence for hypoalbuminemia, and elevated uric acid level.  He cannot specify which of his steroids he was recently taking, but it seems though is currently not taking any steroid, though his chart suggest that he was taking dexamethasone and prednisone recently. Patient is also unsure of which antigout medicine he was taking, but is not seemingly taking any uric acid therapy currently. Patient's labs consistent with edema secondary to hypoalbuminemia, with consideration of gout flare. No evidence for infection, substantial worsening of his renal function, though it is slightly worse than last evaluation.   Update: Patient improved following provision of analgesia, initiation of anti-uric acid therapy, and of steroids. With the after mentioned lab findings, there is no evidence for dangerous or life-threatening phenomena, but there is a suggestion of persistent hypoalbuminemia, and with worsening renal disease, and consideration of his need for appropriate ongoing management of his uric acid levels, the  patient was encouraged to follow-up with his primary care physician promptly.   Final Clinical Impressions(s) / ED Diagnoses  Extremity pain   Carmin Muskrat, MD 09/29/17 2124

## 2017-09-29 NOTE — ED Notes (Signed)
Pharmacy to bring allopurinol.

## 2017-09-29 NOTE — Discharge Instructions (Signed)
As discussed, today's findings demonstrate multiple abnormalities that require additional management with your primary care team. In the interim, please take all medication as prescribed, monitor your condition carefully, and do not hesitate to return here if you develop new, or concerning changes in your condition.

## 2017-10-03 ENCOUNTER — Telehealth: Payer: Self-pay | Admitting: Oncology

## 2017-10-03 ENCOUNTER — Inpatient Hospital Stay: Payer: Medicare Other | Attending: Oncology

## 2017-10-03 ENCOUNTER — Inpatient Hospital Stay (HOSPITAL_BASED_OUTPATIENT_CLINIC_OR_DEPARTMENT_OTHER): Payer: Medicare Other | Admitting: Oncology

## 2017-10-03 VITALS — BP 166/72 | HR 71 | Temp 98.0°F | Resp 20 | Ht 71.0 in | Wt 183.8 lb

## 2017-10-03 DIAGNOSIS — D630 Anemia in neoplastic disease: Secondary | ICD-10-CM

## 2017-10-03 DIAGNOSIS — C642 Malignant neoplasm of left kidney, except renal pelvis: Secondary | ICD-10-CM

## 2017-10-03 DIAGNOSIS — C7889 Secondary malignant neoplasm of other digestive organs: Secondary | ICD-10-CM | POA: Insufficient documentation

## 2017-10-03 DIAGNOSIS — C649 Malignant neoplasm of unspecified kidney, except renal pelvis: Secondary | ICD-10-CM

## 2017-10-03 LAB — CBC WITH DIFFERENTIAL/PLATELET
BASOS ABS: 0 10*3/uL (ref 0.0–0.1)
Basophils Relative: 0 %
EOS PCT: 0 %
Eosinophils Absolute: 0 10*3/uL (ref 0.0–0.5)
HCT: 29.5 % — ABNORMAL LOW (ref 38.4–49.9)
Hemoglobin: 9.8 g/dL — ABNORMAL LOW (ref 13.0–17.1)
LYMPHS ABS: 0.4 10*3/uL — AB (ref 0.9–3.3)
Lymphocytes Relative: 6 %
MCH: 33.2 pg (ref 27.2–33.4)
MCHC: 33.3 g/dL (ref 32.0–36.0)
MCV: 99.5 fL — AB (ref 79.3–98.0)
MONO ABS: 0.1 10*3/uL (ref 0.1–0.9)
MONOS PCT: 2 %
NEUTROS ABS: 6.1 10*3/uL (ref 1.5–6.5)
Neutrophils Relative %: 92 %
PLATELETS: 164 10*3/uL (ref 140–400)
RBC: 2.96 MIL/uL — ABNORMAL LOW (ref 4.20–5.82)
RDW: 19.4 % — AB (ref 11.0–15.6)
WBC: 6.6 10*3/uL (ref 4.0–10.3)

## 2017-10-03 LAB — COMPREHENSIVE METABOLIC PANEL
ALT: 28 U/L (ref 0–55)
ANION GAP: 5 (ref 3–11)
AST: 21 U/L (ref 5–34)
Albumin: 2.8 g/dL — ABNORMAL LOW (ref 3.5–5.0)
Alkaline Phosphatase: 74 U/L (ref 40–150)
BUN: 20 mg/dL (ref 7–26)
CHLORIDE: 107 mmol/L (ref 98–109)
CO2: 28 mmol/L (ref 22–29)
CREATININE: 1.25 mg/dL (ref 0.70–1.30)
Calcium: 8.5 mg/dL (ref 8.4–10.4)
GFR, EST NON AFRICAN AMERICAN: 58 mL/min — AB (ref >60)
Glucose, Bld: 141 mg/dL — ABNORMAL HIGH (ref 70–140)
Potassium: 4.9 mmol/L (ref 3.5–5.1)
SODIUM: 140 mmol/L (ref 136–145)
Total Bilirubin: 0.4 mg/dL (ref 0.2–1.2)
Total Protein: 5.8 g/dL — ABNORMAL LOW (ref 6.4–8.3)

## 2017-10-03 NOTE — Telephone Encounter (Signed)
Gave avs and calendar for march °

## 2017-10-03 NOTE — Progress Notes (Signed)
Hematology and Oncology Follow Up Visit  Jeffrey Trujillo [id number] 10/03/17     Principle Diagnosis: 67 year old with the following issues:  1. T1b right kidney cancer diagnosed in 2010. He is S/P rdical nephrectomy on June 27, 2009. The pathological staging was T1b clear cell histology with Fuhrman grade 3/4.  2. Left Kidney mass  Measuring 4.5 x 2 7 x 3.6 irregular enhancing appears to be a new kidney cancer noted in 07/2012. 3. Pancreatic lesion. 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   Prior therapy:  He was started on Votrient  800 mg daily started in December 2013.Therapy discontinued in June 2017 because of progression of disease.   Current therapy:  Cabometyx 40 mg daily started in July 2017.   Therapy was held on August of 2017 because of perianal abscess.   Therapy to be resumed on 12/22/2016 and has been on it since that time.  Interim History: Jeffrey Trujillo presents for a follow up visit.  He reports a few issues since the last visit.  He continues to develop intermittent gout and requires prednisone therapy which resulted in lower extremity edema.  He is currently on tapering doses of prednisone although he continues to have lower extremity edema.  He denies any shortness of breath, difficulty breathing or dyspnea on exertion.  Continues to take Cabometyx 40 mg daily without any new complications.  He denies any excessive fatigue or tiredness.  He denies any hand-foot syndrome or diarrhea.  His quality of life and performance status remain excellent.    He did not report any headaches blurred vision double vision. Does not report any motor sensory neuropathy or alteration of mental status. He does not report any depression or anxiety.  He does not report any fevers, chills or sweats.  He does not report any chest pain, palpitation orthopnea.  He does not report any cough, wheezing or hemoptysis.  He does not report any frequency urgency or hesitancy.  He does not report any other urinary symptoms.  He does not report any skeletal complaints.  Remainder of his review of systems is negative.   Medications: Reviewed today and remained same. Current Outpatient Medications  Medication Sig Dispense Refill  . allopurinol (ZYLOPRIM) 100 MG tablet Take 0.5 tablets (50 mg total) by mouth 2 (two) times daily. 20 tablet 0  . baclofen (LIORESAL) 20 MG tablet Take 20 mg by mouth 2 (two) times daily.    . cabozantinib S-Malate (CABOMETYX) 40 MG TABS Take 1 tablet by mouth daily. On an empty stomach 1 hour before or 2 hours after a meal. 30 tablet 0  . docusate sodium (COLACE) 100 MG capsule Take 1 capsule (100 mg total) by mouth every 12 (twelve) hours. 60 capsule 0  . HYDROcodone-acetaminophen (NORCO/VICODIN) 5-325 MG tablet Take 1 tablet by mouth every 4 (four) hours as needed. 15 tablet 0  . pantoprazole (PROTONIX) 40 MG tablet Take 1 tablet (40 mg total) by mouth 2 (two) times daily before a meal. 60 tablet 5  . predniSONE (DELTASONE) 20 MG tablet Take 2 tablets (40 mg total) by mouth daily with breakfast. For the next four days 8 tablet 0  . topiramate (TOPAMAX) 25 MG tablet Take 25 mg by mouth 2 (two) times daily.     No current facility-administered medications for this visit.      Allergies:  Allergies  Allergen Reactions  . Nsaids Other (See Comments)    Patient only has one kidney.   Marland Kitchen  Meperidine Hcl Hives and Rash    Past Medical History, Surgical history, Social history, and Family History were reviewed and updated.   Physical Exam: Blood pressure (!) 166/72, pulse 71, temperature 98 F (36.7 C), temperature source Oral, resp. rate 20, height 5\' 11"  (1.803 m), weight 183 lb 12.8 oz (83.4 kg), SpO2 100 %.   ECOG: 1 General appearance: Comfortable appearing gentleman without distress Oropharynx: No oral thrush or ulcers. Eyes: No scleral icterus.  Pupils are equal and round and reactive to light.. Lymph nodes: Cervical,  supraclavicular, and axillary nodes normal.  Heart:regular rate and rhythm, S1, S2.  Lung:chest clear, without rhonchi or wheezes. Abdomen: soft, non-tender, without masses or organomegaly.  No rebound or guarding. Genital exam: No joint effusion or tenderness.  Edema noted bilaterally at 2+ in his lower extremities. Neurological exam: no motor or sensory deficits noted. Skin: No rashes or lesions noted. no petechiae or ecchymosis.    CBC    Component Value Date/Time   WBC 6.6 10/03/2017 1250   RBC 2.96 (L) 10/03/2017 1250   HGB 9.8 (L) 10/03/2017 1250   HGB 10.8 (L) 07/30/2017 1004   HCT 29.5 (L) 10/03/2017 1250   HCT 32.8 (L) 07/30/2017 1004   PLT 164 10/03/2017 1250   PLT 124 (L) 07/30/2017 1004   MCV 99.5 (H) 10/03/2017 1250   MCV 101.7 (H) 07/30/2017 1004   MCH 33.2 10/03/2017 1250   MCHC 33.3 10/03/2017 1250   RDW 19.4 (H) 10/03/2017 1250   RDW 20.4 (H) 07/30/2017 1004   LYMPHSABS 0.4 (L) 10/03/2017 1250   LYMPHSABS 0.3 (L) 07/30/2017 1004   MONOABS 0.1 10/03/2017 1250   MONOABS 0.4 07/30/2017 1004   EOSABS 0.0 10/03/2017 1250   EOSABS 0.0 07/30/2017 1004   BASOSABS 0.0 10/03/2017 1250   BASOSABS 0.0 07/30/2017 1004     IMPRESSION: 1. 4.4 x 5.2 x 4.8 cm hypervascular left renal mass centered in the upper pole of the left kidney with extension into the left renal hilum and invasion into the left renal vein, as discussed above, compatible with known left-sided renal cell carcinoma. No associated extension beyond Gerota's fascia and no lymphadenopathy. Previously noted left adrenal nodule has significantly decreased in size, suggesting a partially treated metastatic lesion. However, the previously noted lesion in the mid body of the pancreas has increased in size, and there is a new lesion in the head of the pancreas, both of which are concerning for metastatic lesions, as discussed above. 2. Potential filling defect in the left superficial femoral vein  and common femoral vein while this may simply be artifactual related to flow artifact, clinical correlation is recommended and if there is any clinical concern for deep venous thrombosis, further evaluation with lower extremity Doppler ultrasound should be considered. 3. 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 4. Colonic diverticulosis without evidence of acute diverticulitis at this time. 5. Aortic atherosclerosis. 6. Additional incidental findings, as above. Aortic Atherosclerosis (ICD10-I70.0).     Impression and Plan:   66 year old gentleman with the following issues:   1. Renal cell carcinoma.  He presented with stage T1b in October 2010 of the right kidney, status post right nephrectomy. He also has left kidney cancer and pancreatic metastasis detected in 2013.  He was initially started on Votrient in December 2013.  He developed disease progression in 2017.  He is currently on Cabometyx which was resumed in April 2018.   CT scan obtained 07/30/2017 was personally reviewed again and discussed with the  patient.  His disease status appears to be stable.  He does not report any complications related to this medication.  Risks and benefits of continuing this therapy versus different therapy was discussed and is agreeable to continue..   2. Pancreatic lesion.  This is due to metastatic renal cell carcinoma.  We will continue to monitor this on future scans.  3. Thrombocytopenia: Resolved at this time.  Related to Votrient previously.  4. Anemia.  His hemoglobin is 9.8 and relatively stable.  This is multifactorial in nature related to malignancy as well as his cancer treatment.  5. Increase LFTs: Liver function test on September 29, 2017 was within normal range.  6. Perirectal abscess: Resolved at this time.  Continues to follow with Dr. Marcello Moores regarding this issue.  7. Follow-up:  In 8 weeks.   25 minutes was spent face-to-face with the patient today.   More than 50% of the time was dedicated to education, counseling and coordination of his care.  [name] MD 10/03/17

## 2017-10-08 ENCOUNTER — Emergency Department (HOSPITAL_COMMUNITY)
Admission: EM | Admit: 2017-10-08 | Discharge: 2017-10-08 | Disposition: A | Discharge status: Home / Self Care | Payer: Medicare Other | Attending: Emergency Medicine | Admitting: Emergency Medicine

## 2017-10-08 ENCOUNTER — Other Ambulatory Visit: Payer: Self-pay | Admitting: Oncology

## 2017-10-08 ENCOUNTER — Encounter (HOSPITAL_COMMUNITY): Payer: Self-pay | Admitting: Emergency Medicine

## 2017-10-08 ENCOUNTER — Other Ambulatory Visit: Payer: Self-pay

## 2017-10-08 DIAGNOSIS — R600 Localized edema: Secondary | ICD-10-CM | POA: Diagnosis present

## 2017-10-08 DIAGNOSIS — N183 Chronic kidney disease, stage 3 (moderate): Secondary | ICD-10-CM | POA: Insufficient documentation

## 2017-10-08 DIAGNOSIS — Z79899 Other long term (current) drug therapy: Secondary | ICD-10-CM | POA: Diagnosis not present

## 2017-10-08 DIAGNOSIS — F17210 Nicotine dependence, cigarettes, uncomplicated: Secondary | ICD-10-CM | POA: Diagnosis not present

## 2017-10-08 DIAGNOSIS — C7889 Secondary malignant neoplasm of other digestive organs: Secondary | ICD-10-CM | POA: Diagnosis not present

## 2017-10-08 MED ORDER — FUROSEMIDE 20 MG PO TABS: 20.0000 mg | ORAL_TABLET | Freq: Two times a day (BID) | ORAL | 0 refills | Status: DC

## 2017-10-08 MED ORDER — FUROSEMIDE 40 MG PO TABS
20.0000 mg | ORAL_TABLET | Freq: Once | ORAL | Status: AC
  Administered 2017-10-08: 20 mg via ORAL
  Filled 2017-10-08: qty 1

## 2017-10-08 MED ORDER — POTASSIUM CHLORIDE CRYS ER 20 MEQ PO TBCR: 20.0000 meq | EXTENDED_RELEASE_TABLET | Freq: Two times a day (BID) | ORAL | 0 refills | Status: DC

## 2017-10-08 MED ORDER — TRAMADOL HCL 50 MG PO TABS
50.0000 mg | ORAL_TABLET | Freq: Once | ORAL | Status: AC
  Administered 2017-10-08: 50 mg via ORAL
  Filled 2017-10-08: qty 1

## 2017-10-08 MED ORDER — POTASSIUM CHLORIDE CRYS ER 20 MEQ PO TBCR
20.0000 meq | EXTENDED_RELEASE_TABLET | Freq: Once | ORAL | Status: AC
  Administered 2017-10-08: 20 meq via ORAL
  Filled 2017-10-08: qty 1

## 2017-10-08 NOTE — ED Triage Notes (Signed)
Pt states was seen here one week ago for same. States went to PCP today and was sent here for evaluation. Denies SOB or chest pain.

## 2017-10-09 NOTE — ED Provider Notes (Signed)
[facility] EMERGENCY DEPARTMENT Provider Note   CSN: [id number] Arrival date & time: 10/08/17  1011     History   Chief Complaint Chief Complaint  Patient presents with  . Leg Pain    HPI Jeffrey Trujillo is a 67 y.o. male.  HPI   68 year old male with leg edema and tightness.  Worsening over the last several weeks.  He has no respiratory complaints.  Denies orthopnea.  No fevers or chills.  Past Medical History:  Diagnosis Date  . Anal fistula   . Anemia in chronic renal disease   . Barrett's esophagus    by EGD 11/02/2009  . Chronic pain   . CKD (chronic kidney disease), stage III ([place])   . Diverticulosis of colon    by colonoscopy 11/02/2009  . DJD (degenerative joint disease) of cervical spine   . GERD (gastroesophageal reflux disease)   . Gouty arthritis    per pt on 05-16-2016 stable (last bout bilatearl wrist 03/ 2017)  . History of diverticulitis of colon    w/ small perforation , no surgical intervention 06/ 2014  . History of peptic ulcer disease   . Iron deficiency anemia due to chronic blood loss   . Metastatic renal cell carcinoma [facility]) oncologist-  dr Alen Blew--  currently on oral chemo -- RCC w/ mets to pancreas slow progression per last ct 06/ 2017   dx 2010 right renal cell carcinoma (pT1b, Fuhrman grade 3/4) s/p  radical nephrectomy 06-27-2009/  dx left renal cell carcinoma w/ mets to pancreas 11/ 2013    . Nocturia   . Thrombocytopenia ([place])    secondary to oral chemo  . Wears glasses     Patient Active Problem List   Diagnosis Date Noted  . History of renal cell cancer   . Chronic neck pain   . Goals of care, counseling/discussion   . Palliative care encounter   . Encephalopathy acute 11/15/2016  . Fever 12/07/2015  . Gouty arthritis 12/07/2015  . Fall   . Pyrexia   . Acute gout 11/15/2015  . Perirectal abscess 11/12/2015  . Hypokalemia 11/12/2015  . Anemia of chronic disease 11/12/2015  . Gallstones   . Sinus bradycardia 02/14/2015    . Malnutrition of moderate degree ([place]) 02/12/2015  . Elevated LFTs 02/12/2015  . Abdominal pain, right upper quadrant 02/12/2015  . Acute kidney injury ([place]) 02/12/2015  . Cholecystitis 02/11/2015  . Hiatal hernia   . Chest pain 04/15/2014  . PUD (peptic ulcer disease) 04/21/2013  . Melena 03/10/2013  . Abdominal pain 03/10/2013  . Leukopenia 03/10/2013  . Thrombocytopenia ([place]) 03/10/2013  . Abdominal abscess 03/10/2013  . Anemia 03/10/2013  . ETOH abuse 03/10/2013  . Bradycardia 03/10/2013  . Barrett's esophagus 01/04/2013  . Renal cell cancer ([place]) 09/25/2012  . Lower GI bleed 09/25/2012  . Anemia due to blood loss 09/25/2012  . Acute gout of right foot 09/25/2012  . DJD (degenerative joint disease) 09/25/2012  . Pancreatic mass 08/04/2012  . Acute strain of neck muscle 06/05/2011  . RECTAL BLEEDING 10/20/2009  . NAUSEA WITH VOMITING 10/20/2009  . DIARRHEA, BLOODY 10/20/2009    Past Surgical History:  Procedure Laterality Date  . COLONOSCOPY  Feb 2011   [name]: single anal papilla, pan-colonic diverticula  . COLONOSCOPY  09/25/2012   RMR: Colonic diverticulosis. Suspect diverticular bleeding-hepatic flexure tic-sealed  . ESOPHAGOGASTRODUODENOSCOPY  Feb 2011   [name]: salmon-colored epithelium consistent with Barrett's, nodular antral erosions, small hiatal hernia, negative H.pylori  .  ESOPHAGOGASTRODUODENOSCOPY  09/25/2012   JXB:JYNWGNF'A esophagus. Small hiatal hernia. Antral erosions of doubtful clinical significance, no biopsies  . ESOPHAGOGASTRODUODENOSCOPY N/A 03/11/2013   SLF: Barrett's esophagus/Single ulcer in the gastric antrum-PROBABLE SOURCE FOR BLACK STOOL/ Chronic gastritis in the gastric antrum  . ESOPHAGOGASTRODUODENOSCOPY N/A 10/11/2014   Dr. [name] barrett's s/p biopsy. Hiatal hernia. Biopsy with intestinal metaplasia, glandular atypia indefinite for low grade dysplasia noted as well  . ESOPHAGOGASTRODUODENOSCOPY N/A 01/19/2015   [name]:  Barrett's no dysplasia  . EUS  08/07/2012   Procedure: UPPER ENDOSCOPIC ULTRASOUND (EUS) LINEAR;  Surgeon: [name], MD;  Location: [facility] ENDOSCOPY;  Service: Endoscopy;  Laterality: N/A;  . EUS  08/21/2012   Procedure: UPPER ENDOSCOPIC ULTRASOUND (EUS) LINEAR;  Surgeon: [name], MD;  Location: [facility] ENDOSCOPY;  Service: Endoscopy;  Laterality: N/A;  . INCISION AND DRAINAGE ABSCESS N/A 11/13/2015   Procedure: INCISION AND DRAINAGE ABSCESS;  Surgeon: Aviva Signs, MD;  Location: [facility] ORS;  Service: General;  Laterality: N/A;  . INGUINAL HERNIA REPAIR Bilateral 1990's  . LAPAROSCOPIC NEPHRECTOMY Right 06/27/2009   dr Alinda Money  . LUNG REMOVAL, PARTIAL  1970   left lower lobe, benign       Home Medications    Prior to Admission medications   Medication Sig Start Date End Date Taking? Authorizing Provider  allopurinol (ZYLOPRIM) 100 MG tablet Take 0.5 tablets (50 mg total) by mouth 2 (two) times daily. 09/29/17   Carmin Muskrat, MD  baclofen (LIORESAL) 20 MG tablet Take 20 mg by mouth 2 (two) times daily.    [provider]  CABOMETYX 40 MG TABS TAKE 1 TABLET BY MOUTH DAILY ON AN EMPTY STOMACH 1 HOUR BEFORE OR 2 HOURS AFTER A MEAL 10/08/17   [name], MD  docusate sodium (COLACE) 100 MG capsule Take 1 capsule (100 mg total) by mouth every 12 (twelve) hours. 02/26/16   Mesner, Corene Cornea, MD  furosemide (LASIX) 20 MG tablet Take 1 tablet (20 mg total) by mouth 2 (two) times daily. 10/08/17   Virgel Manifold, MD  HYDROcodone-acetaminophen (NORCO/VICODIN) 5-325 MG tablet Take 1 tablet by mouth every 4 (four) hours as needed. 09/29/17   Carmin Muskrat, MD  pantoprazole (PROTONIX) 40 MG tablet Take 1 tablet (40 mg total) by mouth 2 (two) times daily before a meal. 04/12/16   Annitta Needs, NP  potassium chloride SA (K-DUR,KLOR-CON) 20 MEQ tablet Take 1 tablet (20 mEq total) by mouth 2 (two) times daily. 10/08/17   Virgel Manifold, MD  predniSONE (DELTASONE) 20 MG tablet Take 2 tablets (40  mg total) by mouth daily with breakfast. For the next four days 09/29/17   Carmin Muskrat, MD  topiramate (TOPAMAX) 25 MG tablet Take 25 mg by mouth 2 (two) times daily.    [provider]    Family History Family History  Problem Relation Age of Onset  . Lung cancer Brother   . Prostate cancer Brother   . Lumbar disc disease Brother   . Prostate cancer Brother   . Colon cancer Neg Hx     Social History Social History   Tobacco Use  . Smoking status: Former Smoker    Packs/day: 0.25    Years: 4.00    Pack years: 1.00    Types: Cigars    Last attempt to quit: 05/16/2014    Years since quitting: 3.4  . Smokeless tobacco: Current User    Types: Chew  Substance Use Topics  . Alcohol use: Yes  Alcohol/week: 2.4 - 3.0 oz    Types: 2 - 3 Cans of beer, 2 Shots of liquor per week    Comment: binges intermittently, 1/5 vodka daily until it makes him sick, last ETOH was about 3 months ago  . Drug use: No     Allergies   Amoxicillin; Nsaids; Zofran [ondansetron hcl]; and Demerol [meperidine]   Review of Systems Review of Systems  All systems reviewed and negative, other than as noted in HPI.  Physical Exam Updated Vital Signs BP (!) 146/80   Pulse 91   Temp 98 F (36.7 C) (Oral)   Resp 18   Ht 5\' 11"  (1.803 m)   Wt 83 kg (183 lb)   SpO2 98%   BMI 25.52 kg/m   Physical Exam  Constitutional: He appears well-developed and well-nourished. No distress.  HENT:  Head: Normocephalic and atraumatic.  Eyes: Conjunctivae are normal. Right eye exhibits no discharge. Left eye exhibits no discharge.  Neck: Neck supple.  Cardiovascular: Normal rate, regular rhythm and normal heart sounds. Exam reveals no gallop and no friction rub.  No murmur heard. Pulmonary/Chest: Effort normal and breath sounds normal. No respiratory distress.  Abdominal: Soft. He exhibits no distension. There is no tenderness.  Musculoskeletal: He exhibits edema. He exhibits no tenderness.    Severe symmetric, pitting lower extremity edema.  No rash.  No skin breakdown.  Neurological: He is alert.  Skin: Skin is warm and dry.  Psychiatric: He has a normal mood and affect. His behavior is normal. Thought content normal.  Nursing note and vitals reviewed.    ED Treatments / Results  Labs (all labs ordered are listed, but only abnormal results are displayed) Labs Reviewed - No data to display  EKG  EKG Interpretation None       Radiology No results found.  Procedures Procedures (including critical care time)  Medications Ordered in ED Medications  furosemide (LASIX) tablet 20 mg (20 mg Oral Given 10/08/17 1502)  potassium chloride SA (K-DUR,KLOR-CON) CR tablet 20 mEq (20 mEq Oral Given 10/08/17 1502)  traMADol (ULTRAM) tablet 50 mg (50 mg Oral Given 10/08/17 1507)     Initial Impression / Assessment and Plan / ED Course  I have reviewed the triage vital signs and the nursing notes.  Pertinent labs & imaging results that were available during my care of the patient were reviewed by me and considered in my medical decision making (see chart for details).     67 year old male with severe lower extremity edema.  He has no respiratory complaints.  I doubt infectious etiology.  Diallo bilateral DVT.  We will put him on Lasix for a few days.  Return precautions were discussed.  Keep elevated.  Also suggested compression stockings.  Final Clinical Impressions(s) / ED Diagnoses   Final diagnoses:  Bilateral leg edema    ED Discharge Orders        Ordered    furosemide (LASIX) 20 MG tablet  2 times daily     10/08/17 1452    potassium chloride SA (K-DUR,KLOR-CON) 20 MEQ tablet  2 times daily     10/08/17 1452       Virgel Manifold, MD 10/09/17 2127

## 2017-10-29 ENCOUNTER — Other Ambulatory Visit: Payer: Self-pay

## 2017-10-29 ENCOUNTER — Emergency Department (HOSPITAL_COMMUNITY)
Admission: EM | Admit: 2017-10-29 | Discharge: 2017-10-29 | Disposition: A | Discharge status: Home / Self Care | Payer: Medicare Other | Attending: Emergency Medicine | Admitting: Emergency Medicine

## 2017-10-29 ENCOUNTER — Encounter (HOSPITAL_COMMUNITY): Payer: Self-pay

## 2017-10-29 DIAGNOSIS — R2241 Localized swelling, mass and lump, right lower limb: Secondary | ICD-10-CM | POA: Diagnosis present

## 2017-10-29 DIAGNOSIS — M109 Gout, unspecified: Secondary | ICD-10-CM | POA: Diagnosis not present

## 2017-10-29 DIAGNOSIS — Z87891 Personal history of nicotine dependence: Secondary | ICD-10-CM | POA: Diagnosis not present

## 2017-10-29 DIAGNOSIS — Z79899 Other long term (current) drug therapy: Secondary | ICD-10-CM | POA: Insufficient documentation

## 2017-10-29 DIAGNOSIS — N183 Chronic kidney disease, stage 3 (moderate): Secondary | ICD-10-CM | POA: Insufficient documentation

## 2017-10-29 MED ORDER — OXYCODONE-ACETAMINOPHEN 5-325 MG PO TABS
1.0000 | ORAL_TABLET | Freq: Once | ORAL | Status: AC
  Administered 2017-10-29: 1 via ORAL
  Filled 2017-10-29: qty 1

## 2017-10-29 MED ORDER — OXYCODONE-ACETAMINOPHEN 5-325 MG PO TABS: 1.0000 | ORAL_TABLET | ORAL | 0 refills | Status: DC | PRN

## 2017-10-29 MED ORDER — PREDNISONE 20 MG PO TABS: 40.0000 mg | ORAL_TABLET | Freq: Every day | ORAL | 0 refills | Status: DC

## 2017-10-29 MED ORDER — DEXAMETHASONE SODIUM PHOSPHATE 4 MG/ML IJ SOLN
8.0000 mg | Freq: Once | INTRAMUSCULAR | Status: AC
  Administered 2017-10-29: 8 mg via INTRAMUSCULAR
  Filled 2017-10-29: qty 2

## 2017-10-29 NOTE — Discharge Instructions (Signed)
Start the prednisone tomorrow.  Call your primary provider and arrange a follow-up appointment.  Return to ER for any worsening symptoms.

## 2017-10-29 NOTE — ED Provider Notes (Signed)
[facility] EMERGENCY DEPARTMENT Provider Note   CSN: [id number] Arrival date & time: 10/29/17  0745     History   Chief Complaint Chief Complaint  Patient presents with  . Hand Pain    Right  . Foot Pain    Left    HPI Jeffrey Trujillo is a 67 y.o. male.  HPI   Jeffrey Trujillo is a 67 y.o. male with history of gout, CKD, and metastatic renal cell carcinoma,  presents to the Emergency Department complaining of pain redness and swelling of his right and left foot.  He states that he he noticed pain to those areas several days ago that worsened last evening.  He states that his pain is similar to previous gout flares.  He has pain with any movement of his right hand and with weightbearing.  He denies known injury.  He also denies numbness, fever, chills, and extremity weakness.  He has been on several gout medications in the past, lastly, he was taking allopurinol with minimal relief but states that he has ran out of the medication.   Past Medical History:  Diagnosis Date  . Anal fistula   . Anemia in chronic renal disease   . Barrett's esophagus    by EGD 11/02/2009  . Chronic pain   . CKD (chronic kidney disease), stage III ([place])   . Diverticulosis of colon    by colonoscopy 11/02/2009  . DJD (degenerative joint disease) of cervical spine   . GERD (gastroesophageal reflux disease)   . Gouty arthritis    per pt on 05-16-2016 stable (last bout bilatearl wrist 03/ 2017)  . History of diverticulitis of colon    w/ small perforation , no surgical intervention 06/ 2014  . History of peptic ulcer disease   . Iron deficiency anemia due to chronic blood loss   . Metastatic renal cell carcinoma [facility]) oncologist-  dr Alen Blew--  currently on oral chemo -- RCC w/ mets to pancreas slow progression per last ct 06/ 2017   dx 2010 right renal cell carcinoma (pT1b, Fuhrman grade 3/4) s/p  radical nephrectomy 06-27-2009/  dx left renal cell carcinoma w/ mets to pancreas 11/ 2013    .  Nocturia   . Thrombocytopenia ([place])    secondary to oral chemo  . Wears glasses     Patient Active Problem List   Diagnosis Date Noted  . History of renal cell cancer   . Chronic neck pain   . Goals of care, counseling/discussion   . Palliative care encounter   . Encephalopathy acute 11/15/2016  . Fever 12/07/2015  . Gouty arthritis 12/07/2015  . Fall   . Pyrexia   . Acute gout 11/15/2015  . Perirectal abscess 11/12/2015  . Hypokalemia 11/12/2015  . Anemia of chronic disease 11/12/2015  . Gallstones   . Sinus bradycardia 02/14/2015  . Malnutrition of moderate degree ([place]) 02/12/2015  . Elevated LFTs 02/12/2015  . Abdominal pain, right upper quadrant 02/12/2015  . Acute kidney injury ([place]) 02/12/2015  . Cholecystitis 02/11/2015  . Hiatal hernia   . Chest pain 04/15/2014  . PUD (peptic ulcer disease) 04/21/2013  . Melena 03/10/2013  . Abdominal pain 03/10/2013  . Leukopenia 03/10/2013  . Thrombocytopenia ([place]) 03/10/2013  . Abdominal abscess 03/10/2013  . Anemia 03/10/2013  . ETOH abuse 03/10/2013  . Bradycardia 03/10/2013  . Barrett's esophagus 01/04/2013  . Renal cell cancer ([place]) 09/25/2012  . Lower GI bleed 09/25/2012  . Anemia due to blood loss  09/25/2012  . Acute gout of right foot 09/25/2012  . DJD (degenerative joint disease) 09/25/2012  . Pancreatic mass 08/04/2012  . Acute strain of neck muscle 06/05/2011  . RECTAL BLEEDING 10/20/2009  . NAUSEA WITH VOMITING 10/20/2009  . DIARRHEA, BLOODY 10/20/2009    Past Surgical History:  Procedure Laterality Date  . COLONOSCOPY  Feb 2011   [name]: single anal papilla, pan-colonic diverticula  . COLONOSCOPY  09/25/2012   RMR: Colonic diverticulosis. Suspect diverticular bleeding-hepatic flexure tic-sealed  . ESOPHAGOGASTRODUODENOSCOPY  Feb 2011   [name]: salmon-colored epithelium consistent with Barrett's, nodular antral erosions, small hiatal hernia, negative H.pylori  . ESOPHAGOGASTRODUODENOSCOPY  09/25/2012    GHW:EXHBZJI'R esophagus. Small hiatal hernia. Antral erosions of doubtful clinical significance, no biopsies  . ESOPHAGOGASTRODUODENOSCOPY N/A 03/11/2013   SLF: Barrett's esophagus/Single ulcer in the gastric antrum-PROBABLE SOURCE FOR BLACK STOOL/ Chronic gastritis in the gastric antrum  . ESOPHAGOGASTRODUODENOSCOPY N/A 10/11/2014   Dr. [name] barrett's s/p biopsy. Hiatal hernia. Biopsy with intestinal metaplasia, glandular atypia indefinite for low grade dysplasia noted as well  . ESOPHAGOGASTRODUODENOSCOPY N/A 01/19/2015   [name]: Barrett's no dysplasia  . EUS  08/07/2012   Procedure: UPPER ENDOSCOPIC ULTRASOUND (EUS) LINEAR;  Surgeon: [name], MD;  [facility] ENDOSCOPY;  Service: Endoscopy;  Laterality: N/A;  . EUS  08/21/2012   Procedure: UPPER ENDOSCOPIC ULTRASOUND (EUS) LINEAR;  Surgeon: [name], MD;  Location: [facility] ENDOSCOPY;  Service: Endoscopy;  Laterality: N/A;  . INCISION AND DRAINAGE ABSCESS N/A 11/13/2015   Procedure: INCISION AND DRAINAGE ABSCESS;  Surgeon: Aviva Signs, MD;  Location: [facility] ORS;  Service: General;  Laterality: N/A;  . INGUINAL HERNIA REPAIR Bilateral 1990's  . LAPAROSCOPIC NEPHRECTOMY Right 06/27/2009   dr Alinda Money  . LUNG REMOVAL, PARTIAL  1970   left lower lobe, benign       Home Medications    Prior to Admission medications   Medication Sig Start Date End Date Taking? Authorizing Provider  allopurinol (ZYLOPRIM) 100 MG tablet Take 0.5 tablets (50 mg total) by mouth 2 (two) times daily. 09/29/17   Carmin Muskrat, MD  baclofen (LIORESAL) 20 MG tablet Take 20 mg by mouth 2 (two) times daily.    [provider]  CABOMETYX 40 MG TABS TAKE 1 TABLET BY MOUTH DAILY ON AN EMPTY STOMACH 1 HOUR BEFORE OR 2 HOURS AFTER A MEAL 10/08/17   [name], MD  docusate sodium (COLACE) 100 MG capsule Take 1 capsule (100 mg total) by mouth every 12 (twelve) hours. 02/26/16   Mesner, Corene Cornea, MD  furosemide (LASIX) 20 MG tablet Take 1 tablet  (20 mg total) by mouth 2 (two) times daily. 10/08/17   Virgel Manifold, MD  HYDROcodone-acetaminophen (NORCO/VICODIN) 5-325 MG tablet Take 1 tablet by mouth every 4 (four) hours as needed. 09/29/17   Carmin Muskrat, MD  pantoprazole (PROTONIX) 40 MG tablet Take 1 tablet (40 mg total) by mouth 2 (two) times daily before a meal. 04/12/16   Annitta Needs, NP  potassium chloride SA (K-DUR,KLOR-CON) 20 MEQ tablet Take 1 tablet (20 mEq total) by mouth 2 (two) times daily. 10/08/17   Virgel Manifold, MD  predniSONE (DELTASONE) 20 MG tablet Take 2 tablets (40 mg total) by mouth daily with breakfast. For the next four days 09/29/17   Carmin Muskrat, MD  topiramate (TOPAMAX) 25 MG tablet Take 25 mg by mouth 2 (two) times daily.    [provider]    Family History Family History  Problem Relation  Age of Onset  . Lung cancer Brother   . Prostate cancer Brother   . Lumbar disc disease Brother   . Prostate cancer Brother   . Colon cancer Neg Hx     Social History Social History   Tobacco Use  . Smoking status: Former Smoker    Packs/day: 0.25    Years: 4.00    Pack years: 1.00    Types: Cigars    Last attempt to quit: 05/16/2014    Years since quitting: 3.4  . Smokeless tobacco: Current User    Types: Chew  Substance Use Topics  . Alcohol use: Yes    Alcohol/week: 2.4 - 3.0 oz    Types: 2 - 3 Cans of beer, 2 Shots of liquor per week    Comment: binges intermittently, 1/5 vodka daily until it makes him sick, last ETOH was about 3 months ago  . Drug use: No     Allergies   Amoxicillin; Nsaids; Zofran [ondansetron hcl]; and Demerol [meperidine]   Review of Systems Review of Systems  Constitutional: Negative for appetite change, chills and fever.  HENT: Negative for congestion and sore throat.   Respiratory: Negative for cough, chest tightness, shortness of breath and wheezing.   Cardiovascular: Negative for chest pain.  Gastrointestinal: Negative for abdominal pain, nausea and  vomiting.  Genitourinary: Negative for dysuria.  Musculoskeletal: Positive for joint swelling. Negative for arthralgias (Right hand and left foot pain and swelling).  Skin: Positive for color change (redness of the left great toe and right thumb). Negative for rash.  Neurological: Negative for dizziness, weakness and numbness.  Hematological: Negative for adenopathy.  All other systems reviewed and are negative.    Physical Exam Updated Vital Signs BP (!) 121/98 (BP Location: Left Arm)   Pulse 98   Temp 98.4 F (36.9 C) (Oral)   Resp 20   Ht 5\' 11"  (1.803 m)   Wt 84.8 kg (187 lb)   SpO2 100%   BMI 26.08 kg/m   Physical Exam  Constitutional: He is oriented to person, place, and time. He appears well-developed and well-nourished. No distress.  HENT:  Head: Normocephalic and atraumatic.  Mouth/Throat: Oropharynx is clear and moist.  Neck: Normal range of motion. Neck supple.  Cardiovascular: Normal rate, regular rhythm and intact distal pulses.  Pulmonary/Chest: Effort normal and breath sounds normal. No respiratory distress.  Musculoskeletal: He exhibits edema and tenderness.  ttp of the first MT head left foot.  Mild erythema and edema noted.  Also ttp, edema and erythema at the base of right thumb and right wrist.    Neurological: He is alert and oriented to person, place, and time. No sensory deficit. He exhibits normal muscle tone. Coordination normal.  Skin: Skin is warm and dry. Capillary refill takes less than 2 seconds. No rash noted.  Nursing note and vitals reviewed.    ED Treatments / Results  Labs (all labs ordered are listed, but only abnormal results are displayed) Labs Reviewed - No data to display  EKG  EKG Interpretation None       Radiology No results found.  Procedures Procedures (including critical care time)  Medications Ordered in ED Medications  dexamethasone (DECADRON) injection 8 mg (not administered)  oxyCODONE-acetaminophen  (PERCOCET/ROXICET) 5-325 MG per tablet 1 tablet (not administered)     Initial Impression / Assessment and Plan / ED Course  I have reviewed the triage vital signs and the nursing notes.  Pertinent labs & imaging results that were  available during my care of the patient were reviewed by me and considered in my medical decision making (see chart for details).     Previous medical records reviewed by me.  Labs from 10/03/17 showed BUN and creatinine wnml.    Pt with likely gout flare to multiple joints.  No concerning sx's for cellulitis.  NV intact.  Patient agrees to care plan with short course of prednisone and Percocet for pain  Pt also seen by Dr. Lacinda Axon and care plan discussed.    Final Clinical Impressions(s) / ED Diagnoses   Final diagnoses:  Acute gout of multiple sites, unspecified cause    ED Discharge Orders    None       Kem Parkinson, PA-C 10/29/17 1017    [name], MD 10/30/17 [phone]

## 2017-10-29 NOTE — ED Triage Notes (Signed)
Patient reports of right hand pain/left foot pain since last night. Patient denies injury. Hx of gout. Not currently taking anything per patient.

## 2017-11-07 ENCOUNTER — Other Ambulatory Visit: Payer: Self-pay | Admitting: Oncology

## 2017-11-15 ENCOUNTER — Emergency Department (HOSPITAL_COMMUNITY): Payer: Medicare Other

## 2017-11-15 ENCOUNTER — Other Ambulatory Visit: Payer: Self-pay

## 2017-11-15 ENCOUNTER — Inpatient Hospital Stay (HOSPITAL_COMMUNITY)
Admission: EM | Admit: 2017-11-15 | Discharge: 2017-11-17 | DRG: 299 | Disposition: A | Discharge status: Home / Self Care | Payer: Medicare Other | Attending: Internal Medicine | Admitting: Internal Medicine

## 2017-11-15 ENCOUNTER — Encounter (HOSPITAL_COMMUNITY): Payer: Self-pay | Admitting: Emergency Medicine

## 2017-11-15 DIAGNOSIS — Z885 Allergy status to narcotic agent status: Secondary | ICD-10-CM | POA: Diagnosis not present

## 2017-11-15 DIAGNOSIS — I82413 Acute embolism and thrombosis of femoral vein, bilateral: Secondary | ICD-10-CM | POA: Diagnosis not present

## 2017-11-15 DIAGNOSIS — Z66 Do not resuscitate: Secondary | ICD-10-CM | POA: Diagnosis present

## 2017-11-15 DIAGNOSIS — M109 Gout, unspecified: Secondary | ICD-10-CM | POA: Diagnosis present

## 2017-11-15 DIAGNOSIS — Z905 Acquired absence of kidney: Secondary | ICD-10-CM | POA: Diagnosis not present

## 2017-11-15 DIAGNOSIS — D630 Anemia in neoplastic disease: Secondary | ICD-10-CM | POA: Diagnosis present

## 2017-11-15 DIAGNOSIS — I82433 Acute embolism and thrombosis of popliteal vein, bilateral: Secondary | ICD-10-CM | POA: Diagnosis present

## 2017-11-15 DIAGNOSIS — T451X5A Adverse effect of antineoplastic and immunosuppressive drugs, initial encounter: Secondary | ICD-10-CM | POA: Diagnosis present

## 2017-11-15 DIAGNOSIS — Z8711 Personal history of peptic ulcer disease: Secondary | ICD-10-CM | POA: Diagnosis not present

## 2017-11-15 DIAGNOSIS — I82403 Acute embolism and thrombosis of unspecified deep veins of lower extremity, bilateral: Secondary | ICD-10-CM | POA: Diagnosis not present

## 2017-11-15 DIAGNOSIS — I80203 Phlebitis and thrombophlebitis of unspecified deep vessels of lower extremities, bilateral: Secondary | ICD-10-CM | POA: Insufficient documentation

## 2017-11-15 DIAGNOSIS — I82442 Acute embolism and thrombosis of left tibial vein: Secondary | ICD-10-CM | POA: Diagnosis present

## 2017-11-15 DIAGNOSIS — Z79899 Other long term (current) drug therapy: Secondary | ICD-10-CM

## 2017-11-15 DIAGNOSIS — C7889 Secondary malignant neoplasm of other digestive organs: Secondary | ICD-10-CM | POA: Diagnosis present

## 2017-11-15 DIAGNOSIS — Z85528 Personal history of other malignant neoplasm of kidney: Secondary | ICD-10-CM | POA: Diagnosis not present

## 2017-11-15 DIAGNOSIS — Z7952 Long term (current) use of systemic steroids: Secondary | ICD-10-CM | POA: Diagnosis not present

## 2017-11-15 DIAGNOSIS — D61810 Antineoplastic chemotherapy induced pancytopenia: Secondary | ICD-10-CM | POA: Diagnosis present

## 2017-11-15 DIAGNOSIS — D6869 Other thrombophilia: Secondary | ICD-10-CM | POA: Diagnosis present

## 2017-11-15 DIAGNOSIS — D72819 Decreased white blood cell count, unspecified: Secondary | ICD-10-CM

## 2017-11-15 DIAGNOSIS — Z888 Allergy status to other drugs, medicaments and biological substances status: Secondary | ICD-10-CM | POA: Diagnosis not present

## 2017-11-15 DIAGNOSIS — M7989 Other specified soft tissue disorders: Secondary | ICD-10-CM | POA: Diagnosis present

## 2017-11-15 DIAGNOSIS — Z87891 Personal history of nicotine dependence: Secondary | ICD-10-CM | POA: Diagnosis not present

## 2017-11-15 DIAGNOSIS — D638 Anemia in other chronic diseases classified elsewhere: Secondary | ICD-10-CM | POA: Diagnosis not present

## 2017-11-15 DIAGNOSIS — C642 Malignant neoplasm of left kidney, except renal pelvis: Secondary | ICD-10-CM | POA: Diagnosis present

## 2017-11-15 DIAGNOSIS — Z886 Allergy status to analgesic agent status: Secondary | ICD-10-CM

## 2017-11-15 DIAGNOSIS — Z801 Family history of malignant neoplasm of trachea, bronchus and lung: Secondary | ICD-10-CM

## 2017-11-15 DIAGNOSIS — D61818 Other pancytopenia: Secondary | ICD-10-CM | POA: Diagnosis not present

## 2017-11-15 DIAGNOSIS — Z8042 Family history of malignant neoplasm of prostate: Secondary | ICD-10-CM

## 2017-11-15 DIAGNOSIS — Z881 Allergy status to other antibiotic agents status: Secondary | ICD-10-CM | POA: Diagnosis not present

## 2017-11-15 LAB — PROTIME-INR
INR: 1.03
Prothrombin Time: 13.4 seconds (ref 11.4–15.2)

## 2017-11-15 LAB — CBC WITH DIFFERENTIAL/PLATELET
Basophils Absolute: 0 10*3/uL (ref 0.0–0.1)
Basophils Relative: 0 %
EOS ABS: 0.1 10*3/uL (ref 0.0–0.7)
EOS PCT: 3 %
HCT: 30.8 % — ABNORMAL LOW (ref 39.0–52.0)
Hemoglobin: 9.6 g/dL — ABNORMAL LOW (ref 13.0–17.0)
LYMPHS ABS: 0.6 10*3/uL — AB (ref 0.7–4.0)
Lymphocytes Relative: 18 %
MCH: 31.3 pg (ref 26.0–34.0)
MCHC: 31.2 g/dL (ref 30.0–36.0)
MCV: 100.3 fL — ABNORMAL HIGH (ref 78.0–100.0)
MONO ABS: 0.2 10*3/uL (ref 0.1–1.0)
Monocytes Relative: 6 %
Neutro Abs: 2.5 10*3/uL (ref 1.7–7.7)
Neutrophils Relative %: 73 %
PLATELETS: 127 10*3/uL — AB (ref 150–400)
RBC: 3.07 MIL/uL — ABNORMAL LOW (ref 4.22–5.81)
RDW: 17.8 % — ABNORMAL HIGH (ref 11.5–15.5)
WBC: 3.4 10*3/uL — ABNORMAL LOW (ref 4.0–10.5)

## 2017-11-15 LAB — COMPREHENSIVE METABOLIC PANEL
ALK PHOS: 71 U/L (ref 38–126)
ALT: 25 U/L (ref 17–63)
ANION GAP: 7 (ref 5–15)
AST: 25 U/L (ref 15–41)
Albumin: 2.7 g/dL — ABNORMAL LOW (ref 3.5–5.0)
BUN: 10 mg/dL (ref 6–20)
CALCIUM: 8.1 mg/dL — AB (ref 8.9–10.3)
CHLORIDE: 103 mmol/L (ref 101–111)
CO2: 28 mmol/L (ref 22–32)
Creatinine, Ser: 0.97 mg/dL (ref 0.61–1.24)
GFR calc Af Amer: >60 mL/min (ref >60)
GFR calc non Af Amer: >60 mL/min (ref >60)
Glucose, Bld: 88 mg/dL (ref 65–99)
Potassium: 3.7 mmol/L (ref 3.5–5.1)
SODIUM: 138 mmol/L (ref 135–145)
Total Bilirubin: 0.5 mg/dL (ref 0.3–1.2)
Total Protein: 5.9 g/dL — ABNORMAL LOW (ref 6.5–8.1)

## 2017-11-15 LAB — TYPE AND SCREEN
ABO/RH(D): B NEG
Antibody Screen: NEGATIVE

## 2017-11-15 LAB — ABO/RH: ABO/RH(D): B NEG

## 2017-11-15 MED ORDER — HYDROMORPHONE HCL 1 MG/ML IJ SOLN
1.0000 mg | INTRAMUSCULAR | Status: DC | PRN
  Administered 2017-11-16 – 2017-11-17 (×4): 1 mg via INTRAVENOUS
  Filled 2017-11-15 (×4): qty 1

## 2017-11-15 MED ORDER — PROMETHAZINE HCL 25 MG PO TABS: 12.5000 mg | ORAL_TABLET | Freq: Four times a day (QID) | ORAL | Status: DC | PRN

## 2017-11-15 MED ORDER — SODIUM CHLORIDE 0.9% FLUSH
3.0000 mL | Freq: Two times a day (BID) | INTRAVENOUS | Status: DC
  Administered 2017-11-15 – 2017-11-17 (×4): 3 mL via INTRAVENOUS

## 2017-11-15 MED ORDER — HEPARIN BOLUS VIA INFUSION
4250.0000 [IU] | Freq: Once | INTRAVENOUS | Status: AC
Start: 2017-11-15 — End: 2017-11-15
  Administered 2017-11-15: 4250 [IU] via INTRAVENOUS
  Filled 2017-11-15: qty 4250

## 2017-11-15 MED ORDER — ACETAMINOPHEN 650 MG RE SUPP: 650.0000 mg | Freq: Four times a day (QID) | RECTAL | Status: DC | PRN

## 2017-11-15 MED ORDER — SENNOSIDES-DOCUSATE SODIUM 8.6-50 MG PO TABS: 1.0000 | ORAL_TABLET | Freq: Every evening | ORAL | Status: DC | PRN

## 2017-11-15 MED ORDER — FAMOTIDINE IN NACL 20-0.9 MG/50ML-% IV SOLN
20.0000 mg | Freq: Two times a day (BID) | INTRAVENOUS | Status: DC
  Administered 2017-11-15 – 2017-11-17 (×4): 20 mg via INTRAVENOUS
  Filled 2017-11-15 (×4): qty 50

## 2017-11-15 MED ORDER — BISACODYL 5 MG PO TBEC: 5.0000 mg | DELAYED_RELEASE_TABLET | Freq: Every day | ORAL | Status: DC | PRN

## 2017-11-15 MED ORDER — HYDROCODONE-ACETAMINOPHEN 5-325 MG PO TABS
1.0000 | ORAL_TABLET | ORAL | Status: DC | PRN
  Administered 2017-11-15: 2 via ORAL
  Administered 2017-11-16 – 2017-11-17 (×5): 1 via ORAL
  Filled 2017-11-15 (×3): qty 1
  Filled 2017-11-15: qty 2
  Filled 2017-11-15: qty 1
  Filled 2017-11-15: qty 2

## 2017-11-15 MED ORDER — ACETAMINOPHEN 325 MG PO TABS: 650.0000 mg | ORAL_TABLET | Freq: Four times a day (QID) | ORAL | Status: DC | PRN

## 2017-11-15 MED ORDER — ALLOPURINOL 100 MG PO TABS
50.0000 mg | ORAL_TABLET | Freq: Two times a day (BID) | ORAL | Status: DC
  Administered 2017-11-15 – 2017-11-17 (×4): 50 mg via ORAL
  Filled 2017-11-15 (×4): qty 1

## 2017-11-15 MED ORDER — IOPAMIDOL (ISOVUE-300) INJECTION 61%
INTRAVENOUS | Status: AC
  Administered 2017-11-15: 100 mL
  Filled 2017-11-15: qty 100

## 2017-11-15 MED ORDER — POTASSIUM CHLORIDE IN NACL 20-0.9 MEQ/L-% IV SOLN
INTRAVENOUS | Status: AC
  Administered 2017-11-15: via INTRAVENOUS
  Filled 2017-11-15: qty 1000

## 2017-11-15 MED ORDER — MORPHINE SULFATE (PF) 4 MG/ML IV SOLN
4.0000 mg | Freq: Once | INTRAVENOUS | Status: AC
  Administered 2017-11-15: 4 mg via INTRAVENOUS
  Filled 2017-11-15: qty 1

## 2017-11-15 MED ORDER — HEPARIN (PORCINE) IN NACL 100-0.45 UNIT/ML-% IJ SOLN
1400.0000 [IU]/h | INTRAMUSCULAR | Status: DC
  Administered 2017-11-15: 1400 [IU]/h via INTRAVENOUS
  Filled 2017-11-15: qty 250

## 2017-11-15 NOTE — Progress Notes (Signed)
ANTICOAGULATION CONSULT NOTE - Initial Consult  Pharmacy Consult for heparin Indication: DVT  Allergies  Allergen Reactions  . Amoxicillin Nausea And Vomiting  . Nsaids Other (See Comments)    Patient only has one kidney.   Marland Kitchen Zofran [Ondansetron Hcl] Hives  . Demerol [Meperidine] Rash    Patient Measurements: Height: 5\' 11"  (180.3 cm) Weight: 187 lb (84.8 kg) IBW/kg (Calculated) : 75.3 Heparin Dosing Weight: 84.8 kg   Vital Signs: Temp: 98.2 F (36.8 C) (03/01 1437) Temp Source: Oral (03/01 1437) BP: 149/89 (03/01 1631) Pulse Rate: 83 (03/01 1631)  Labs: Recent Labs    11/15/17 1330 11/15/17 1816  HGB 9.6*  --   HCT 30.8*  --   PLT 127*  --   LABPROT  --  13.4  INR  --  1.03  CREATININE 0.97  --     Estimated Creatinine Clearance: 79.8 mL/min (by C-G formula based on SCr of 0.97 mg/dL).   Medical History: Past Medical History:  Diagnosis Date  . Anal fistula   . Anemia in chronic renal disease   . Barrett's esophagus    by EGD 11/02/2009  . Chronic pain   . CKD (chronic kidney disease), stage III ([place])   . Diverticulosis of colon    by colonoscopy 11/02/2009  . DJD (degenerative joint disease) of cervical spine   . GERD (gastroesophageal reflux disease)   . Gouty arthritis    per pt on 05-16-2016 stable (last bout bilatearl wrist 03/ 2017)  . History of diverticulitis of colon    w/ small perforation , no surgical intervention 06/ 2014  . History of peptic ulcer disease   . Iron deficiency anemia due to chronic blood loss   . 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 Nocturia   . Thrombocytopenia ([place])    secondary to oral chemo  . Wears glasses    Assessment: 43 yom presented to  [facility] with bilateral lower extremity swelling with doppler revealing extensive bilateral DVTs. Transferred to [facility] for evaluation by vascular surgery. No anticoag PTA or at [facility].   Hgb 9.6, plts low 127. Renal function is stable (Scr 0.97). No signs/symptoms of bleeding.  Goal of Therapy:  Heparin level 0.3-0.7 units/ml Monitor platelets by anticoagulation protocol: Yes   Plan:  Give 4250 units bolus x 1 Start heparin infusion at 1400 units/hr Check anti-Xa level in 6 hours and daily while on heparin Continue to monitor H&H and platelets  Doylene Canard, PharmD Clinical Pharmacist  Pager: [phone] Phone: [phone] 11/15/2017,7:18 PM

## 2017-11-15 NOTE — ED Provider Notes (Addendum)
[facility] EMERGENCY DEPARTMENT Provider Note   CSN: [id number] Arrival date & time: 11/15/17  1220     History   Chief Complaint Chief Complaint  Patient presents with  . Leg Swelling    HPI Jeffrey Trujillo is a 67 y.o. male.  Chief complaint lower extremity swelling for the past week.  No chest pain or dyspnea.  Past medical history includes metastatic renal cell carcinoma.  He is being treated locally with chemotherapy.  Severity of symptoms is moderate.      Past Medical History:  Diagnosis Date  . Anal fistula   . Anemia in chronic renal disease   . Barrett's esophagus    by EGD 11/02/2009  . Chronic pain   . CKD (chronic kidney disease), stage III ([place])   . Diverticulosis of colon    by colonoscopy 11/02/2009  . DJD (degenerative joint disease) of cervical spine   . GERD (gastroesophageal reflux disease)   . Gouty arthritis    per pt on 05-16-2016 stable (last bout bilatearl wrist 03/ 2017)  . History of diverticulitis of colon    w/ small perforation , no surgical intervention 06/ 2014  . History of peptic ulcer disease   . Iron deficiency anemia due to chronic blood loss   . Metastatic renal cell carcinoma [facility]) oncologist-  dr Alen Blew--  currently on oral chemo -- RCC w/ mets to pancreas slow progression per last ct 06/ 2017   dx 2010 right renal cell carcinoma (pT1b, Fuhrman grade 3/4) s/p  radical nephrectomy 06-27-2009/  dx left renal cell carcinoma w/ mets to pancreas 11/ 2013    . Nocturia   . Thrombocytopenia ([place])    secondary to oral chemo  . Wears glasses     Patient Active Problem List   Diagnosis Date Noted  . History of renal cell cancer   . Chronic neck pain   . Goals of care, counseling/discussion   . Palliative care encounter   . Encephalopathy acute 11/15/2016  . Fever 12/07/2015  . Gouty arthritis 12/07/2015  . Fall   . Pyrexia   . Acute gout 11/15/2015  . Perirectal abscess 11/12/2015  . Hypokalemia 11/12/2015  . Anemia of  chronic disease 11/12/2015  . Gallstones   . Sinus bradycardia 02/14/2015  . Malnutrition of moderate degree ([place]) 02/12/2015  . Elevated LFTs 02/12/2015  . Abdominal pain, right upper quadrant 02/12/2015  . Acute kidney injury ([place]) 02/12/2015  . Cholecystitis 02/11/2015  . Hiatal hernia   . Chest pain 04/15/2014  . PUD (peptic ulcer disease) 04/21/2013  . Melena 03/10/2013  . Abdominal pain 03/10/2013  . Leukopenia 03/10/2013  . Thrombocytopenia ([place]) 03/10/2013  . Abdominal abscess 03/10/2013  . Anemia 03/10/2013  . ETOH abuse 03/10/2013  . Bradycardia 03/10/2013  . Barrett's esophagus 01/04/2013  . Renal cell cancer ([place]) 09/25/2012  . Lower GI bleed 09/25/2012  . Anemia due to blood loss 09/25/2012  . Acute gout of right foot 09/25/2012  . DJD (degenerative joint disease) 09/25/2012  . Pancreatic mass 08/04/2012  . Acute strain of neck muscle 06/05/2011  . RECTAL BLEEDING 10/20/2009  . NAUSEA WITH VOMITING 10/20/2009  . DIARRHEA, BLOODY 10/20/2009    Past Surgical History:  Procedure Laterality Date  . COLONOSCOPY  Feb 2011   [name]: single anal papilla, pan-colonic diverticula  . COLONOSCOPY  09/25/2012   RMR: Colonic diverticulosis. Suspect diverticular bleeding-hepatic flexure tic-sealed  . ESOPHAGOGASTRODUODENOSCOPY  Feb 2011   [name]: salmon-colored epithelium consistent  with Barrett's, nodular antral erosions, small hiatal hernia, negative H.pylori  . ESOPHAGOGASTRODUODENOSCOPY  09/25/2012   [name]:WJXBJYN'W esophagus. Small hiatal hernia. Antral erosions of doubtful clinical significance, no biopsies  . ESOPHAGOGASTRODUODENOSCOPY N/A 03/11/2013   SLF: Barrett's esophagus/Single ulcer in the gastric antrum-PROBABLE SOURCE FOR BLACK STOOL/ Chronic gastritis in the gastric antrum  . ESOPHAGOGASTRODUODENOSCOPY N/A 10/11/2014   Dr. [name] barrett's s/p biopsy. Hiatal hernia. Biopsy with intestinal metaplasia, glandular atypia indefinite for low grade  dysplasia noted as well  . ESOPHAGOGASTRODUODENOSCOPY N/A 01/19/2015   [name]: Barrett's no dysplasia  . EUS  08/07/2012   Procedure: UPPER ENDOSCOPIC ULTRASOUND (EUS) LINEAR;  Surgeon: [name], MD;  Location: [facility] ENDOSCOPY;  Service: Endoscopy;  Laterality: N/A;  . EUS  08/21/2012   Procedure: UPPER ENDOSCOPIC ULTRASOUND (EUS) LINEAR;  Surgeon: [name], MD;  Location: [facility] ENDOSCOPY;  Service: Endoscopy;  Laterality: N/A;  . INCISION AND DRAINAGE ABSCESS N/A 11/13/2015   Procedure: INCISION AND DRAINAGE ABSCESS;  Surgeon: Aviva Signs, MD;  Location: [facility] ORS;  Service: General;  Laterality: N/A;  . INGUINAL HERNIA REPAIR Bilateral 1990's  . LAPAROSCOPIC NEPHRECTOMY Right 06/27/2009   dr Alinda Money  . LUNG REMOVAL, PARTIAL  1970   left lower lobe, benign       Home Medications    Prior to Admission medications   Medication Sig Start Date End Date Taking? Authorizing Provider  acetaminophen (TYLENOL) 500 MG tablet Take 1,000 mg by mouth every 6 (six) hours as needed.   Yes [provider]  allopurinol (ZYLOPRIM) 100 MG tablet Take 0.5 tablets (50 mg total) by mouth 2 (two) times daily. Patient taking differently: Take 100 mg by mouth 2 (two) times daily.  09/29/17  Yes Carmin Muskrat, MD  CABOMETYX 40 MG TABS TAKE 1 TABLET BY MOUTH DAILY ON AN EMPTY STOMACH 1 HOUR BEFORE OR 2 HOURS AFTER A MEAL 11/07/17  Yes [name], MD  furosemide (LASIX) 20 MG tablet Take 1 tablet (20 mg total) by mouth 2 (two) times daily. 10/08/17   Virgel Manifold, MD  HYDROcodone-acetaminophen (NORCO/VICODIN) 5-325 MG tablet Take 1 tablet by mouth every 4 (four) hours as needed. 09/29/17   Carmin Muskrat, MD  oxyCODONE-acetaminophen (PERCOCET/ROXICET) 5-325 MG tablet Take 1 tablet by mouth every 4 (four) hours as needed. 10/29/17   Triplett, Tammy, PA-C  potassium chloride SA (K-DUR,KLOR-CON) 20 MEQ tablet Take 1 tablet (20 mEq total) by mouth 2 (two) times daily. 10/08/17   Virgel Manifold, MD    predniSONE (DELTASONE) 20 MG tablet Take 2 tablets (40 mg total) by mouth daily. 10/29/17   Kem Parkinson, PA-C    Family History Family History  Problem Relation Age of Onset  . Lung cancer Brother   . Prostate cancer Brother   . Lumbar disc disease Brother   . Prostate cancer Brother   . Colon cancer Neg Hx     Social History Social History   Tobacco Use  . Smoking status: Former Smoker    Packs/day: 0.25    Years: 4.00    Pack years: 1.00    Types: Cigars    Last attempt to quit: 05/16/2014    Years since quitting: 3.5  . Smokeless tobacco: Current User    Types: Chew  Substance Use Topics  . Alcohol use: Yes    Alcohol/week: 2.4 - 3.0 oz    Types: 2 - 3 Cans of beer, 2 Shots of liquor per week    Comment: binges intermittently, 1/5 vodka  daily until it makes him sick, last ETOH was about 3 months ago  . Drug use: No     Allergies   Amoxicillin; Nsaids; Zofran [ondansetron hcl]; and Demerol [meperidine]   Review of Systems Review of Systems  All other systems reviewed and are negative.    Physical Exam Updated Vital Signs BP (!) 143/89 (BP Location: Right Arm)   Pulse 65   Temp 98.2 F (36.8 C) (Oral)   Resp 18   Ht 5\' 11"  (1.803 m)   Wt 84.8 kg (187 lb)   SpO2 100%   BMI 26.08 kg/m   Physical Exam  Constitutional: He is oriented to person, place, and time. He appears well-developed and well-nourished.  HENT:  Head: Normocephalic and atraumatic.  Eyes: Conjunctivae are normal.  Neck: Neck supple.  Cardiovascular: Normal rate and regular rhythm.  Pulmonary/Chest: Effort normal and breath sounds normal.  Abdominal: Soft. Bowel sounds are normal.  Musculoskeletal:  Bilateral lower extremities: 3-4+ edema from mid thigh distally.  No posterior calf or thigh tenderness.  Neurological: He is alert and oriented to person, place, and time.  Skin: Skin is warm and dry.  Psychiatric: He has a normal mood and affect. His behavior is normal.  Nursing  note and vitals reviewed.    ED Treatments / Results  Labs (all labs ordered are listed, but only abnormal results are displayed) Labs Reviewed  CBC WITH DIFFERENTIAL/PLATELET - Abnormal; Notable for the following components:      Result Value   WBC 3.4 (*)    RBC 3.07 (*)    Hemoglobin 9.6 (*)    HCT 30.8 (*)    MCV 100.3 (*)    RDW 17.8 (*)    Platelets 127 (*)    Lymphs Abs 0.6 (*)    All other components within normal limits  COMPREHENSIVE METABOLIC PANEL - Abnormal; Notable for the following components:   Calcium 8.1 (*)    Total Protein 5.9 (*)    Albumin 2.7 (*)    All other components within normal limits    EKG  EKG Interpretation None       Radiology Dg Chest 2 View  Result Date: 11/15/2017 CLINICAL DATA:  LOWER extremity edema. EXAM: CHEST  2 VIEW COMPARISON:  11/15/2016 and prior radiographs FINDINGS: Cardiomediastinal silhouette is unremarkable. LEFT basilar pleuroparenchymal scarring again noted. There is no evidence of focal airspace disease, pulmonary edema, suspicious pulmonary nodule/mass, pleural effusion, or pneumothorax. No acute bony abnormalities are identified. IMPRESSION: No evidence of acute cardiopulmonary disease. Electronically Signed   By: Margarette Canada M.D.   On: 11/15/2017 13:34   US Venous Img Lower Bilateral  Result Date: 11/15/2017 CLINICAL DATA:  Bilateral lower extremity edema for the past week. Former smoker. History of metastatic renal cell carcinoma. Evaluate for DVT. EXAM: BILATERAL LOWER EXTREMITY VENOUS DOPPLER ULTRASOUND TECHNIQUE: Gray-scale sonography with graded compression, as well as color Doppler and duplex ultrasound were performed to evaluate the lower extremity deep venous systems from the level of the common femoral vein and including the common femoral, femoral, profunda femoral, popliteal and calf veins including the posterior tibial, peroneal and gastrocnemius veins when visible. The superficial great saphenous vein was also  interrogated. Spectral Doppler was utilized to evaluate flow at rest and with distal augmentation maneuvers in the common femoral, femoral and popliteal veins. COMPARISON:  None. FINDINGS: RIGHT LOWER EXTREMITY There is mixed echogenic occlusive DVT within the right common femoral vein (image 4), extending to the proximal (image 13),  mid (image 17) and distal (image 21) aspects of the right femoral vein and the right popliteal vein (image 25). The right deep femoral (image 12) and tibial veins vein appears patent where imaged. The right saphenofemoral junction and greater saphenous veins appear patent where imaged. Other Findings:  None. LEFT LOWER EXTREMITY There is hypoechoic nonocclusive thrombus within the left common femoral vein. There is hypoechoic occlusive DVT seen throughout the interrogated course of the left deep femoral vein (image 44), proximal (image 46), mid (image 51) and distal (image 55) aspects of the femoral vein, extending to involve the left popliteal vein (image 58) and imaged portions of the tibial veins. The left saphenofemoral junction and greater saphenous vein appear patent where imaged. Other Findings:  None. IMPRESSION: Examination is positive for extensive bilateral largely occlusive DVT as detailed above. Electronically Signed   By: Sandi Mariscal M.D.   On: 11/15/2017 14:21    Procedures Procedures (including critical care time)  Medications Ordered in ED Medications - No data to display   Initial Impression / Assessment and Plan / ED Course  I have reviewed the triage vital signs and the nursing notes.  Pertinent labs & imaging results that were available during my care of the patient were reviewed by me and considered in my medical decision making (see chart for details).  Clinical Course as of Nov 16 2206  Fri Nov 15, 2017  1935 Discussed with Dr. Bridgett Larsson vascular surgery on-call who had heard about the patient from his partner.  He is recommending admission to medical  service On anticoagulation and getting a CT venogram of the abdomen and pelvis to make sure that the IVC is not occluded.  Hospitalist page.  [MB]  1949 Discussed with Dr. Myna Hidalgo from the hospitalist service who will admit the patient to his service.   [MB]    Clinical Course User Index [MB] Hayden Rasmussen, MD    Patient presents with bilateral lower extremity swelling.  Doppler study reveals extensive bilateral DVTs.  I consulted Dr. Bridgett Larsson  vascular surgery.  He requested that the patient be admitted to the hospitalist program and he would consult in [place].  Discussed the clinical scenario with the hospitalist at [facility].  No direct admission at this time.  Patient will be evaluated in the emergency department by the surgeon and then a decision will be made about placement.  Final Clinical Impressions(s) / ED Diagnoses   Final diagnoses:  Acute deep vein thrombosis (DVT) of both lower extremities, unspecified vein [facility])    ED Discharge Orders    None       [name], MD 11/15/17 1528    [name], MD 11/15/17 2210

## 2017-11-15 NOTE — ED Provider Notes (Signed)
[place] EMERGENCY DEPARTMENT Provider Note   CSN: [id number] Arrival date & time: 11/15/17  1220     History   Chief Complaint Chief Complaint  Patient presents with  . Leg Swelling    HPI DRAYDEN Trujillo is a 67 y.o. male.  He is transferred here from [facility] after an evaluation.  He has a history of a patients with renal cell carcinoma status post nephrectomy with active cancer on oral chemotherapy agents.  He has had progressive bilateral leg swelling for 4 days with tightness for pain.  It is not been draining any fluid in it is not associated with any fevers chest pain or shortness of breath.  This is a new problem for him.  He presented to [facility] where he had a venous duplex that showed bilateral extensive DVT.  Vascular surgery was consulted and the recommendation was to transfer to [facility] for evaluation.  The [facility] hospitalist Jeffrey Trujillo was also informed of the patient's as he may need a medical admission.  The history is provided by the patient.  Leg Pain   This is a new problem. The current episode started more than 2 days ago. The problem occurs constantly. The problem has been gradually worsening. The pain is present in the left lower leg, left upper leg, right upper leg and right lower leg. The quality of the pain is described as dull. The pain is moderate. Associated symptoms include limited range of motion, stiffness and tingling. The symptoms are aggravated by activity. There has been no history of extremity trauma.    Past Medical History:  Diagnosis Date  . Anal fistula   . Anemia in chronic renal disease   . Barrett's esophagus    by EGD 11/02/2009  . Chronic pain   . CKD (chronic kidney disease), stage III ([place])   . Diverticulosis of colon    by colonoscopy 11/02/2009  . DJD (degenerative joint disease) of cervical spine   . GERD (gastroesophageal reflux disease)   . Gouty arthritis    per pt on 05-16-2016 stable (last  bout bilatearl wrist 03/ 2017)  . History of diverticulitis of colon    w/ small perforation , no surgical intervention 06/ 2014  . History of peptic ulcer disease   . Iron deficiency anemia due to chronic blood loss   . Metastatic renal cell carcinoma Jeffrey Trujillo) oncologist-  dr Alen Blew--  currently on oral chemo -- RCC w/ mets to pancreas slow progression per last ct 06/ 2017   dx 2010 right renal cell carcinoma (pT1b, Fuhrman grade 3/4) s/p  radical nephrectomy 06-27-2009/  dx left renal cell carcinoma w/ mets to pancreas 11/ 2013    . Nocturia   . Thrombocytopenia ([place])    secondary to oral chemo  . Wears glasses     Patient Active Problem List   Diagnosis Date Noted  . History of renal cell cancer   . Chronic neck pain   . Goals of care, counseling/discussion   . Palliative care encounter   . Encephalopathy acute 11/15/2016  . Fever 12/07/2015  . Gouty arthritis 12/07/2015  . Fall   . Pyrexia   . Acute gout 11/15/2015  . Perirectal abscess 11/12/2015  . Hypokalemia 11/12/2015  . Anemia of chronic disease 11/12/2015  . Gallstones   . Sinus bradycardia 02/14/2015  . Malnutrition of moderate degree ([place]) 02/12/2015  . Elevated LFTs 02/12/2015  . Abdominal pain, right upper quadrant 02/12/2015  .  Acute kidney injury ([employer]) 02/12/2015  . Cholecystitis 02/11/2015  . Hiatal hernia   . Chest pain 04/15/2014  . PUD (peptic ulcer disease) 04/21/2013  . Melena 03/10/2013  . Abdominal pain 03/10/2013  . Leukopenia 03/10/2013  . Thrombocytopenia ([place]) 03/10/2013  . Abdominal abscess 03/10/2013  . Anemia 03/10/2013  . ETOH abuse 03/10/2013  . Bradycardia 03/10/2013  . Barrett's esophagus 01/04/2013  . Renal cell cancer ([place]) 09/25/2012  . Lower GI bleed 09/25/2012  . Anemia due to blood loss 09/25/2012  . Acute gout of right foot 09/25/2012  . DJD (degenerative joint disease) 09/25/2012  . Pancreatic mass 08/04/2012  . Acute strain of neck muscle 06/05/2011  . RECTAL BLEEDING  10/20/2009  . NAUSEA WITH VOMITING 10/20/2009  . DIARRHEA, BLOODY 10/20/2009    Past Surgical History:  Procedure Laterality Date  . COLONOSCOPY  Feb 2011   [name]: single anal papilla, pan-colonic diverticula  . COLONOSCOPY  09/25/2012   RMR: Colonic diverticulosis. Suspect diverticular bleeding-hepatic flexure tic-sealed  . ESOPHAGOGASTRODUODENOSCOPY  Feb 2011   [name]: salmon-colored epithelium consistent with Barrett's, nodular antral erosions, small hiatal hernia, negative H.pylori  . ESOPHAGOGASTRODUODENOSCOPY  09/25/2012   HQI:ONGEXBM'W esophagus. Small hiatal hernia. Antral erosions of doubtful clinical significance, no biopsies  . ESOPHAGOGASTRODUODENOSCOPY N/A 03/11/2013   SLF: Barrett's esophagus/Single ulcer in the gastric antrum-PROBABLE SOURCE FOR BLACK STOOL/ Chronic gastritis in the gastric antrum  . ESOPHAGOGASTRODUODENOSCOPY N/A 10/11/2014   Dr. [name] barrett's s/p biopsy. Hiatal hernia. Biopsy with intestinal metaplasia, glandular atypia indefinite for low grade dysplasia noted as well  . ESOPHAGOGASTRODUODENOSCOPY N/A 01/19/2015   [name]: Barrett's no dysplasia  . EUS  08/07/2012   Procedure: UPPER ENDOSCOPIC ULTRASOUND (EUS) LINEAR;  Surgeon: [name], MD;  Location: [facility] ENDOSCOPY;  Service: Endoscopy;  Laterality: N/A;  . EUS  08/21/2012   Procedure: UPPER ENDOSCOPIC ULTRASOUND (EUS) LINEAR;  Surgeon: [name], MD;  Location: [facility] ENDOSCOPY;  Service: Endoscopy;  Laterality: N/A;  . INCISION AND DRAINAGE ABSCESS N/A 11/13/2015   Procedure: INCISION AND DRAINAGE ABSCESS;  Surgeon: Aviva Signs, MD;  Location: [facility]S;  Service: General;  Laterality: N/A;  . INGUINAL HERNIA REPAIR Bilateral 1990's  . LAPAROSCOPIC NEPHRECTOMY Right 06/27/2009   dr Alinda Money  . LUNG REMOVAL, PARTIAL  1970   left lower lobe, benign       Home Medications    Prior to Admission medications   Medication Sig Start Date End Date Taking? Authorizing Provider    acetaminophen (TYLENOL) 500 MG tablet Take 1,000 mg by mouth every 6 (six) hours as needed.   Yes [provider]  allopurinol (ZYLOPRIM) 100 MG tablet Take 0.5 tablets (50 mg total) by mouth 2 (two) times daily. Patient taking differently: Take 100 mg by mouth 2 (two) times daily.  09/29/17  Yes Carmin Muskrat, MD  CABOMETYX 40 MG TABS TAKE 1 TABLET BY MOUTH DAILY ON AN EMPTY STOMACH 1 HOUR BEFORE OR 2 HOURS AFTER A MEAL 11/07/17  Yes [name], MD  furosemide (LASIX) 20 MG tablet Take 1 tablet (20 mg total) by mouth 2 (two) times daily. 10/08/17   Virgel Manifold, MD  HYDROcodone-acetaminophen (NORCO/VICODIN) 5-325 MG tablet Take 1 tablet by mouth every 4 (four) hours as needed. 09/29/17   Carmin Muskrat, MD  oxyCODONE-acetaminophen (PERCOCET/ROXICET) 5-325 MG tablet Take 1 tablet by mouth every 4 (four) hours as needed. 10/29/17   Triplett, Tammy, PA-C  potassium chloride SA (K-DUR,KLOR-CON) 20 MEQ tablet Take 1 tablet (20  mEq total) by mouth 2 (two) times daily. 10/08/17   Virgel Manifold, MD  predniSONE (DELTASONE) 20 MG tablet Take 2 tablets (40 mg total) by mouth daily. 10/29/17   Kem Parkinson, PA-C    Family History Family History  Problem Relation Age of Onset  . Lung cancer Brother   . Prostate cancer Brother   . Lumbar disc disease Brother   . Prostate cancer Brother   . Colon cancer Neg Hx     Social History Social History   Tobacco Use  . Smoking status: Former Smoker    Packs/day: 0.25    Years: 4.00    Pack years: 1.00    Types: Cigars    Last attempt to quit: 05/16/2014    Years since quitting: 3.5  . Smokeless tobacco: Current User    Types: Chew  Substance Use Topics  . Alcohol use: Yes    Alcohol/week: 2.4 - 3.0 oz    Types: 2 - 3 Cans of beer, 2 Shots of liquor per week    Comment: binges intermittently, 1/5 vodka daily until it makes him sick, last ETOH was about 3 months ago  . Drug use: No     Allergies   Amoxicillin; Nsaids; Zofran  [ondansetron hcl]; and Demerol [meperidine]   Review of Systems Review of Systems  Constitutional: Negative for fever.  HENT: Negative for ear pain, sinus pain and sore throat.   Eyes: Negative for pain and redness.  Respiratory: Negative for chest tightness and shortness of breath.   Cardiovascular: Positive for leg swelling. Negative for chest pain.  Gastrointestinal: Negative for abdominal pain.  Genitourinary: Negative for dysuria and frequency.  Musculoskeletal: Positive for stiffness.  Skin: Negative for rash.  Neurological: Positive for tingling. Negative for seizures and syncope.     Physical Exam Updated Vital Signs BP (!) 149/89 (BP Location: Left Arm)   Pulse 83   Temp 98.2 F (36.8 C) (Oral)   Resp (!) 185   Ht 5\' 11"  (1.803 m)   Wt 84.8 kg (187 lb)   SpO2 99%   BMI 26.08 kg/m   Physical Exam  Constitutional: He appears well-developed and well-nourished.  HENT:  Head: Normocephalic and atraumatic.  Eyes: Conjunctivae and EOM are normal. Pupils are equal, round, and reactive to light.  Neck: Normal range of motion. Neck supple.  Cardiovascular: Normal rate and regular rhythm.  No murmur heard. Pulmonary/Chest: Effort normal and breath sounds normal. No respiratory distress.  Abdominal: Soft. There is no tenderness.  Musculoskeletal: He exhibits edema and tenderness.  Neurological: He is alert.  Skin: Skin is warm and dry. Capillary refill takes less than 2 seconds.  Psychiatric: He has a normal mood and affect.  Nursing note and vitals reviewed.    ED Treatments / Results  Labs (all labs ordered are listed, but only abnormal results are displayed) Labs Reviewed  CBC WITH DIFFERENTIAL/PLATELET - Abnormal; Notable for the following components:      Result Value   WBC 3.4 (*)    RBC 3.07 (*)    Hemoglobin 9.6 (*)    HCT 30.8 (*)    MCV 100.3 (*)    RDW 17.8 (*)    Platelets 127 (*)    Lymphs Abs 0.6 (*)    All other components within normal  limits  COMPREHENSIVE METABOLIC PANEL - Abnormal; Notable for the following components:   Calcium 8.1 (*)    Total Protein 5.9 (*)    Albumin 2.7 (*)  All other components within normal limits    EKG  EKG Interpretation None       Radiology Dg Chest 2 View  Result Date: 11/15/2017 CLINICAL DATA:  LOWER extremity edema. EXAM: CHEST  2 VIEW COMPARISON:  11/15/2016 and prior radiographs FINDINGS: Cardiomediastinal silhouette is unremarkable. LEFT basilar pleuroparenchymal scarring again noted. There is no evidence of focal airspace disease, pulmonary edema, suspicious pulmonary nodule/mass, pleural effusion, or pneumothorax. No acute bony abnormalities are identified. IMPRESSION: No evidence of acute cardiopulmonary disease. Electronically Signed   By: Margarette Canada M.D.   On: 11/15/2017 13:34   US Venous Img Lower Bilateral  Result Date: 11/15/2017 CLINICAL DATA:  Bilateral lower extremity edema for the past week. Former smoker. History of metastatic renal cell carcinoma. Evaluate for DVT. EXAM: BILATERAL LOWER EXTREMITY VENOUS DOPPLER ULTRASOUND TECHNIQUE: Gray-scale sonography with graded compression, as well as color Doppler and duplex ultrasound were performed to evaluate the lower extremity deep venous systems from the level of the common femoral vein and including the common femoral, femoral, profunda femoral, popliteal and calf veins including the posterior tibial, peroneal and gastrocnemius veins when visible. The superficial great saphenous vein was also interrogated. Spectral Doppler was utilized to evaluate flow at rest and with distal augmentation maneuvers in the common femoral, femoral and popliteal veins. COMPARISON:  None. FINDINGS: RIGHT LOWER EXTREMITY There is mixed echogenic occlusive DVT within the right common femoral vein (image 4), extending to the proximal (image 13), mid (image 17) and distal (image 21) aspects of the right femoral vein and the right popliteal vein (image  25). The right deep femoral (image 12) and tibial veins vein appears patent where imaged. The right saphenofemoral junction and greater saphenous veins appear patent where imaged. Other Findings:  None. LEFT LOWER EXTREMITY There is hypoechoic nonocclusive thrombus within the left common femoral vein. There is hypoechoic occlusive DVT seen throughout the interrogated course of the left deep femoral vein (image 44), proximal (image 46), mid (image 51) and distal (image 55) aspects of the femoral vein, extending to involve the left popliteal vein (image 58) and imaged portions of the tibial veins. The left saphenofemoral junction and greater saphenous vein appear patent where imaged. Other Findings:  None. IMPRESSION: Examination is positive for extensive bilateral largely occlusive DVT as detailed above. Electronically Signed   By: Sandi Mariscal M.D.   On: 11/15/2017 14:21    Procedures Procedures (including critical care time)  Medications Ordered in ED Medications  morphine 4 MG/ML injection 4 mg (4 mg Intravenous Given 11/15/17 1624)     Initial Impression / Assessment and Plan / ED Course  I have reviewed the triage vital signs and the nursing notes.  Pertinent labs & imaging results that were available during my care of the patient were reviewed by me and considered in my medical decision making (see chart for details).  Clinical Course as of Nov 17 1031  Fri Nov 15, 2017  1935 Discussed with Dr. Bridgett Larsson vascular surgery on-call who had heard about the patient from his partner.  He is recommending admission to medical service On anticoagulation and getting a CT venogram of the abdomen and pelvis to make sure that the IVC is not occluded.  Hospitalist page.  [MB]  1949 Discussed with Dr. Myna Hidalgo from the hospitalist service who will admit the patient to his service.   [MB]    Clinical Course User Index [MB] Hayden Rasmussen, MD     Final Clinical Impressions(s) / ED Diagnoses  Final  diagnoses:  Acute deep vein thrombosis (DVT) of both lower extremities, unspecified vein [facility])    ED Discharge Orders    None       Hayden Rasmussen, MD 11/16/17 1035

## 2017-11-15 NOTE — ED Notes (Signed)
Going for [employer]

## 2017-11-15 NOTE — ED Notes (Signed)
Pt. To CT via stretcher. 

## 2017-11-15 NOTE — ED Notes (Signed)
Attempted to call report, RN to call back.

## 2017-11-15 NOTE — ED Notes (Signed)
[facility] in route, report given

## 2017-11-15 NOTE — ED Notes (Signed)
Pt arrived via [facility] from [facility] for bilateral DVTs. EDP at bedside at this time.

## 2017-11-15 NOTE — Progress Notes (Addendum)
   Daily Progress Note   Jeffrey Trujillo is a 67 y.o. (1951-05-06) male is a patient that was discussed with my partner Dr. Donzetta Matters prior to transfer.  Reportedly he has RCC and now bilateral leg DVT.    - Per Dr. Donzetta Matters, pt tsfr for further work-up and admission to Hospitalist Service for anticoagulation - Would get CT Venogram to check patency of IVC given RCC is known to extend into the IVC.  This patient's BLE DVT may reflect IVC occlusion with tumor or thrombophilia related to his cancer - Full consult to follow awaiting CTV - No emergent interventions anticipated over the weekend.  Adele Barthel, MD, FACS [facility] Office: [phone] Pager: [phone]  11/15/2017, 8:20 PM

## 2017-11-15 NOTE — ED Notes (Signed)
Transport here to take pt to [facility] ED

## 2017-11-15 NOTE — ED Notes (Signed)
Attempted to establish second PIV for continuous fluids; no success at this time.

## 2017-11-15 NOTE — ED Notes (Signed)
Called [facility] for transport to [facility] ER.

## 2017-11-15 NOTE — ED Notes (Signed)
Called for transport to [facility] ED

## 2017-11-15 NOTE — ED Notes (Signed)
Report called to [place] charge.  

## 2017-11-15 NOTE — H&P (Signed)
History and Physical    Jeffrey Trujillo CVE:[id number] DOB: Jan 22, 1951 DOA: 11/15/2017  PCP: [facility]   Patient coming from: Home, by way of [facility]   Chief Complaint: Bilateral LE swelling   HPI: Jeffrey Trujillo is a 67 y.o. male with medical history significant for renal cell carcinoma status post right nephrectomy in 2010 and now on tyrosine kinase inhibitor, presenting to the emergency department for evaluation of progressive swelling of the bilateral lower extremities over the past week.  He denies chest pain, palpitations, or shortness of breath. No recent fever or chills. No blood in stool.   ED Course: Upon arrival to the ED, patient is found to be afebrile, saturating well on room air, and with vitals otherwise stable.  EKG features a sinus tachycardia with rate 104 and PACs.  Chest x-ray is negative for acute cardiopulmonary disease.  Chemistry panel is unremarkable.  CBC is notable for leukopenia with WBC 3400, stable hemoglobin at 9.6, and mild thrombocytopenia with platelets 127,000.  INR is normal.  Venous ultrasound of the lower extremities is concerning for extensive bilateral, largely occlusive DVT.  Vascular surgery was consulted by the ED physician and recommended admission to [facility].  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  Review of Systems:  All other systems reviewed and apart from HPI, are negative.  Past Medical History:  Diagnosis Date  . Anal fistula   . Anemia in chronic renal disease   . Barrett's esophagus    by EGD 11/02/2009  . Chronic pain   . CKD (chronic kidney disease), stage III ([place])   . Diverticulosis of colon    by colonoscopy 11/02/2009  . DJD (degenerative joint  disease) of cervical spine   . GERD (gastroesophageal reflux disease)   . Gouty arthritis    per pt on 05-16-2016 stable (last bout bilatearl wrist 03/ 2017)  . History of diverticulitis of colon    w/ small perforation , no surgical intervention 06/ 2014  . History of peptic ulcer disease   . Iron deficiency anemia due to chronic blood loss   . 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 Nocturia   . 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 Wears glasses     Past Surgical History:  Procedure Laterality Date  . COLONOSCOPY  Feb 2011   [name]: single anal papilla, pan-colonic diverticula  . COLONOSCOPY  09/25/2012   RMR: Colonic diverticulosis. Suspect diverticular bleeding-hepatic flexure tic-sealed  . ESOPHAGOGASTRODUODENOSCOPY  Feb 2011   [name]: salmon-colored epithelium consistent with Barrett's, nodular antral erosions, small hiatal hernia, negative H.pylori  . ESOPHAGOGASTRODUODENOSCOPY  09/25/2012   [facility]:ENIDPOE'U esophagus. Small hiatal hernia. Antral erosions of doubtful clinical significance, no biopsies  . ESOPHAGOGASTRODUODENOSCOPY N/A 03/11/2013   [facility]: Barrett's esophagus/Single ulcer in the gastric antrum-PROBABLE SOURCE FOR BLACK STOOL/ Chronic gastritis in the gastric antrum  . ESOPHAGOGASTRODUODENOSCOPY N/A 10/11/2014   Dr. [name] barrett's  s/p biopsy. Hiatal hernia. Biopsy with intestinal metaplasia, glandular atypia indefinite for low grade dysplasia noted as well  . ESOPHAGOGASTRODUODENOSCOPY N/A 01/19/2015   [name]: Barrett's no dysplasia  . EUS  08/07/2012   Procedure: UPPER ENDOSCOPIC ULTRASOUND (EUS) LINEAR;  Surgeon: [name], MD;  Location: [facility] ENDOSCOPY;   Service: Endoscopy;  Laterality: N/A;  . EUS  08/21/2012   Procedure: UPPER ENDOSCOPIC ULTRASOUND (EUS) LINEAR;  Surgeon: [name], MD;  Location: [facility] ENDOSCOPY;  Service: Endoscopy;  Laterality: N/A;  . INCISION AND DRAINAGE ABSCESS N/A 11/13/2015   Procedure: INCISION AND DRAINAGE ABSCESS;  Surgeon: Aviva Signs, MD;  Location: [facility]S;  Service: General;  Laterality: N/A;  . INGUINAL HERNIA REPAIR Bilateral 1990's  . LAPAROSCOPIC NEPHRECTOMY Right 06/27/2009   dr Alinda Money  . LUNG REMOVAL, PARTIAL  1970   left lower lobe, benign     reports that he quit smoking about 3 years ago. His smoking use included cigars. He has a 1.00 pack-year smoking history. His smokeless tobacco use includes chew. He reports that he drinks about 2.4 - 3.0 oz of alcohol per week. He reports that he does not use drugs.  Allergies  Allergen Reactions  . Amoxicillin Nausea And Vomiting  . Nsaids Other (See Comments)    Patient only has one kidney.   Marland Kitchen Zofran [Ondansetron Hcl] Hives  . Demerol [Meperidine] Rash    Family History  Problem Relation Age of Onset  . Lung cancer Brother   . Prostate cancer Brother   . Lumbar disc disease Brother   . Prostate cancer Brother   . Colon cancer Neg Hx      Prior to Admission medications   Medication Sig Start Date End Date Taking? Authorizing Provider  acetaminophen (TYLENOL) 500 MG tablet Take 1,000 mg by mouth every 6 (six) hours as needed.   Yes [provider]  allopurinol (ZYLOPRIM) 100 MG tablet Take 0.5 tablets (50 mg total) by mouth 2 (two) times daily. Patient taking differently: Take 100 mg by mouth 2 (two) times daily.  09/29/17  Yes Carmin Muskrat, MD  CABOMETYX 40 MG TABS TAKE 1 TABLET BY MOUTH DAILY ON AN EMPTY STOMACH 1 HOUR BEFORE OR 2 HOURS AFTER A MEAL 11/07/17  Yes [name], MD  furosemide (LASIX) 20 MG tablet Take 1 tablet (20 mg total) by mouth 2 (two) times daily. 10/08/17   Virgel Manifold, MD  HYDROcodone-acetaminophen  (NORCO/VICODIN) 5-325 MG tablet Take 1 tablet by mouth every 4 (four) hours as needed. 09/29/17   Carmin Muskrat, MD  oxyCODONE-acetaminophen (PERCOCET/ROXICET) 5-325 MG tablet Take 1 tablet by mouth every 4 (four) hours as needed. 10/29/17   Triplett, Tammy, PA-C  potassium chloride SA (K-DUR,KLOR-CON) 20 MEQ tablet Take 1 tablet (20 mEq total) by mouth 2 (two) times daily. 10/08/17   Virgel Manifold, MD  predniSONE (DELTASONE) 20 MG tablet Take 2 tablets (40 mg total) by mouth daily. 10/29/17   Kem Parkinson, PA-C    Physical Exam: Vitals:   11/15/17 1900 11/15/17 1915 11/15/17 1930 11/15/17 1945  BP: 134/75 (!) 149/67 (!) 145/99 139/76  Pulse: 75 65 61 71  Resp: 18 17 12 15   Temp:      TempSrc:      SpO2: 99% 100% 90% 99%  Weight:      Height:          Constitutional: NAD, calm  Eyes: PERTLA, lids and conjunctivae normal ENMT: Mucous membranes are moist. Posterior pharynx clear of  any exudate or lesions.   Neck: normal, supple, no masses, no thyromegaly Respiratory: clear to auscultation bilaterally, no wheezing, no crackles. Normal respiratory effort.   Cardiovascular: S1 & S2 heard, regular rate and rhythm. Bilateral LE edema and tenderness. Pedal pulses palpable bilaterally.  No significant JVD. Abdomen: No distension, no tenderness, no masses palpated. Bowel sounds normal.  Musculoskeletal: no clubbing / cyanosis. No joint deformity upper and lower extremities.    Skin: no significant rashes, lesions, ulcers. Warm, dry, well-perfused. Neurologic: CN 2-12 grossly intact. Sensation intact. Strength 5/5 in all 4 limbs.  Psychiatric:  Alert and oriented x 3. Pleasant and cooperative.     Labs on Admission: I have personally reviewed following labs and imaging studies  CBC: Recent Labs  Lab 11/15/17 1330  WBC 3.4*  NEUTROABS 2.5  HGB 9.6*  HCT 30.8*  MCV 100.3*  PLT 856*   Basic Metabolic Panel: Recent Labs  Lab 11/15/17 1330  NA 138  K 3.7  CL 103  CO2 28    GLUCOSE 88  BUN 10  CREATININE 0.97  CALCIUM 8.1*   GFR: Estimated Creatinine Clearance: 79.8 mL/min (by C-G formula based on SCr of 0.97 mg/dL). Liver Function Tests: Recent Labs  Lab 11/15/17 1330  AST 25  ALT 25  ALKPHOS 71  BILITOT 0.5  PROT 5.9*  ALBUMIN 2.7*   No results for input(s): LIPASE, AMYLASE in the last 168 hours. No results for input(s): AMMONIA in the last 168 hours. Coagulation Profile: Recent Labs  Lab 11/15/17 1816  INR 1.03   Cardiac Enzymes: No results for input(s): CKTOTAL, CKMB, CKMBINDEX, TROPONINI in the last 168 hours. BNP (last 3 results) No results for input(s): PROBNP in the last 8760 hours. HbA1C: No results for input(s): HGBA1C in the last 72 hours. CBG: No results for input(s): GLUCAP in the last 168 hours. Lipid Profile: No results for input(s): CHOL, HDL, LDLCALC, TRIG, CHOLHDL, LDLDIRECT in the last 72 hours. Thyroid Function Tests: No results for input(s): TSH, T4TOTAL, FREET4, T3FREE, THYROIDAB in the last 72 hours. Anemia Panel: No results for input(s): VITAMINB12, FOLATE, FERRITIN, TIBC, IRON, RETICCTPCT in the last 72 hours. Urine analysis:    Component Value Date/Time   COLORURINE YELLOW 09/29/2017 [place] 09/29/2017 1247   LABSPEC 1.013 09/29/2017 1247   PHURINE 7.0 09/29/2017 1247   GLUCOSEU NEGATIVE 09/29/2017 1247   HGBUR NEGATIVE 09/29/2017 1247   BILIRUBINUR NEGATIVE 09/29/2017 1247   KETONESUR NEGATIVE 09/29/2017 1247   PROTEINUR NEGATIVE 09/29/2017 1247   UROBILINOGEN 4.0 (H) 02/11/2015 1409   NITRITE NEGATIVE 09/29/2017 1247   LEUKOCYTESUR NEGATIVE 09/29/2017 1247   Sepsis Labs: @LABRCNTIP (procalcitonin:4,lacticidven:4) )No results found for this or any previous visit (from the past 240 hour(s)).   Radiological Exams on Admission: Dg Chest 2 View  Result Date: 11/15/2017 CLINICAL DATA:  LOWER extremity edema. EXAM: CHEST  2 VIEW COMPARISON:  11/15/2016 and prior radiographs FINDINGS:  Cardiomediastinal silhouette is unremarkable. LEFT basilar pleuroparenchymal scarring again noted. There is no evidence of focal airspace disease, pulmonary edema, suspicious pulmonary nodule/mass, pleural effusion, or pneumothorax. No acute bony abnormalities are identified. IMPRESSION: No evidence of acute cardiopulmonary disease. Electronically Signed   By: Margarette Canada M.D.   On: 11/15/2017 13:34   US Venous Img Lower Bilateral  Result Date: 11/15/2017 CLINICAL DATA:  Bilateral lower extremity edema for the past week. Former smoker. History of metastatic renal cell carcinoma. Evaluate for DVT. EXAM: BILATERAL LOWER EXTREMITY VENOUS DOPPLER ULTRASOUND TECHNIQUE: Gray-scale sonography  with graded compression, as well as color Doppler and duplex ultrasound were performed to evaluate the lower extremity deep venous systems from the level of the common femoral vein and including the common femoral, femoral, profunda femoral, popliteal and calf veins including the posterior tibial, peroneal and gastrocnemius veins when visible. The superficial great saphenous vein was also interrogated. Spectral Doppler was utilized to evaluate flow at rest and with distal augmentation maneuvers in the common femoral, femoral and popliteal veins. COMPARISON:  None. FINDINGS: RIGHT LOWER EXTREMITY There is mixed echogenic occlusive DVT within the right common femoral vein (image 4), extending to the proximal (image 13), mid (image 17) and distal (image 21) aspects of the right femoral vein and the right popliteal vein (image 25). The right deep femoral (image 12) and tibial veins vein appears patent where imaged. The right saphenofemoral junction and greater saphenous veins appear patent where imaged. Other Findings:  None. LEFT LOWER EXTREMITY There is hypoechoic nonocclusive thrombus within the left common femoral vein. There is hypoechoic occlusive DVT seen throughout the interrogated course of the left deep femoral vein (image  44), proximal (image 46), mid (image 51) and distal (image 55) aspects of the femoral vein, extending to involve the left popliteal vein (image 58) and imaged portions of the tibial veins. The left saphenofemoral junction and greater saphenous vein appear patent where imaged. Other Findings:  None. IMPRESSION: Examination is positive for extensive bilateral largely occlusive DVT as detailed above. Electronically Signed   By: Sandi Mariscal M.D.   On: 11/15/2017 14:21    EKG: Independently reviewed. Sinus tachycardia (rate 104), PAC's.   Assessment/Plan   1. 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 Renal cell carcinoma  - Status-post right nephrectomy in 2010, currently on tyrosine kinase inhibitor Cabometyx  - Follows with oncology, has left renal mass concerning for cancer and a pancreatic lesion with biopsy concerning for RCC - Hold Cabometyx in light of significant acute DVT    3. Pancytopenia  - WBC is 3,400 on admission, Hgb stable at 9.6, and platelets 127k and similar to priors  - No bleeding evident, no infectious process identified, Cabometyx likely contributing, will follow closely while on IV heparin     DVT prophylaxis: IV heparin infusion  Code Status: DNR  Family Communication: Discussed with patient Disposition Plan: Admit to telemetry Consults called: Vascular surgery  Admission status: Inpatient    Vianne Bulls, MD [facility] Pager [phone]  If 7PM-7AM, please contact night-coverage [email or web address] Password [facility]  11/15/2017, 8:27 PM

## 2017-11-15 NOTE — Plan of Care (Signed)
Discussed with patient plan of care for the evening, pain management, heparin drip and diet with some teach back displayed

## 2017-11-15 NOTE — ED Triage Notes (Signed)
Pt reports progressive swelling in feet and legs for the past week.  Denies shortness of breath and states swelling is progressing up legs.  Also rash to face starting this morning.

## 2017-11-16 DIAGNOSIS — D61818 Other pancytopenia: Secondary | ICD-10-CM

## 2017-11-16 DIAGNOSIS — Z85528 Personal history of other malignant neoplasm of kidney: Secondary | ICD-10-CM

## 2017-11-16 LAB — BASIC METABOLIC PANEL
ANION GAP: 8 (ref 5–15)
BUN: 8 mg/dL (ref 6–20)
CHLORIDE: 105 mmol/L (ref 101–111)
CO2: 28 mmol/L (ref 22–32)
Calcium: 8 mg/dL — ABNORMAL LOW (ref 8.9–10.3)
Creatinine, Ser: 0.99 mg/dL (ref 0.61–1.24)
GFR calc Af Amer: >60 mL/min (ref >60)
Glucose, Bld: 103 mg/dL — ABNORMAL HIGH (ref 65–99)
POTASSIUM: 3.8 mmol/L (ref 3.5–5.1)
Sodium: 141 mmol/L (ref 135–145)

## 2017-11-16 LAB — CBC
HEMATOCRIT: 28.1 % — AB (ref 39.0–52.0)
HEMOGLOBIN: 8.7 g/dL — AB (ref 13.0–17.0)
MCH: 31 pg (ref 26.0–34.0)
MCHC: 31 g/dL (ref 30.0–36.0)
MCV: 100 fL (ref 78.0–100.0)
Platelets: 112 10*3/uL — ABNORMAL LOW (ref 150–400)
RBC: 2.81 MIL/uL — ABNORMAL LOW (ref 4.22–5.81)
RDW: 17.7 % — ABNORMAL HIGH (ref 11.5–15.5)
WBC: 3.1 10*3/uL — ABNORMAL LOW (ref 4.0–10.5)

## 2017-11-16 LAB — HEPARIN LEVEL (UNFRACTIONATED): HEPARIN UNFRACTIONATED: 1.09 [IU]/mL — AB (ref 0.30–0.70)

## 2017-11-16 MED ORDER — HEPARIN (PORCINE) IN NACL 100-0.45 UNIT/ML-% IJ SOLN
1250.0000 [IU]/h | INTRAMUSCULAR | Status: DC
  Administered 2017-11-16: 1250 [IU]/h via INTRAVENOUS

## 2017-11-16 MED ORDER — ENOXAPARIN SODIUM 120 MG/0.8ML ~~LOC~~ SOLN
120.0000 mg | SUBCUTANEOUS | Status: DC
  Administered 2017-11-16 – 2017-11-17 (×2): 120 mg via SUBCUTANEOUS
  Filled 2017-11-16 (×2): qty 0.8

## 2017-11-16 NOTE — Plan of Care (Signed)
Patient is still experiencing burning and stinging with tenderness in bilateral lower extremities with 1+ pitting edema.

## 2017-11-16 NOTE — Progress Notes (Signed)
PROGRESS NOTE        PATIENT DETAILS Name: Jeffrey Trujillo Age: 67 y.o. Sex: male Date of Birth: 07-Feb-1951 Admit Date: 11/15/2017 Admitting Physician Vianne Bulls, MD [place]  Brief Narrative: Patient is a 67 y.o. male with history of renal cell carcinoma on Cabometyx-presented to the hospital with significant swelling of his bilateral lower extremities, found to have extensive DVT-started on IV heparin and admitted to the hospitalist service.  Evaluated by vascular surgery-and deemed not to require catheter guided thrombolytic therapy.  Below for further details  Subjective: Bilateral lower extremity swelling has markedly decreased per patient.  Denies any chest pain or shortness of breath.   Assessment/Plan: Bilateral lower extremity DVT: Probably related to hypercoagulable state of malignancy-appreciate vascular surgery input-felt not to require catheter guided thrombolytic therapy-spoke with oncology over the phone (Dr Kale)-recommend patient be transitioned to Lovenox.  Even lack of shortness of breath and chest pain-doubt pulmonary embolism-he is on anticoagulation in any event-further workup will not change management.  We will go ahead and consult case management for Lovenox assistance/pricing etc.  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  Spoke with Dr. Rebekah Chester[place] we continue to hold Cabometyx-until patient has been seen by memory oncologist.  Mild pancytopenia: Stable for now-suspect will be worked up in the outpatient setting-probably related to ongoing VGEF inhibitor for renal cell carcinoma  Gout: No evidence of flare-continue with allopurinol  DVT Prophylaxis: Full dose anticoagulation with Heparin  Code Status: Full code   Family  Communication: None at bedside  Disposition Plan: Remain inpatient-hopefully home in the next day or so  Antimicrobial agents: Anti-infectives (From admission, onward)   None     Procedures: None  CONSULTS:  vascular surgery  Time spent: 25- minutes-Greater than 50% of this time was spent in counseling, explanation of diagnosis, planning of further management, and coordination of care.  MEDICATIONS: Scheduled Meds: . allopurinol  50 mg Oral BID  . sodium chloride flush  3 mL Intravenous Q12H   Continuous Infusions: . famotidine (PEPCID) IV Stopped (11/16/17 0901)  . heparin 1,250 Units/hr (11/16/17 0922)   PRN Meds:.acetaminophen **OR** acetaminophen, bisacodyl, HYDROcodone-acetaminophen, HYDROmorphone (DILAUDID) injection, promethazine, senna-docusate   PHYSICAL EXAM: Vital signs: Vitals:   11/15/17 1945 11/15/17 2045 11/15/17 2210 11/16/17 0533  BP: 139/76 127/88 (!) 157/76 (!) 144/76  Pulse: 71 78 73 75  Resp: 15 12 16 20   Temp:   98.6 F (37 C) 97.9 F (36.6 C)  TempSrc:   Oral Oral  SpO2: 99% 100% 100% 100%  Weight:   81.4 kg (179 lb 7.3 oz)   Height:   5\' 11"  (1.803 m)    Filed Weights   11/15/17 1232 11/15/17 2210  Weight: 84.8 kg (187 lb) 81.4 kg (179 lb 7.3 oz)   Body mass index is 25.03 kg/m.   General appearance :Awake, alert, not in any distress. Speech Clear. Not toxic Looking Eyes:, pupils equally reactive to light and accomodation,no scleral icterus.Pink conjunctiva HEENT: Atraumatic and Normocephalic Neck: supple, no JVD. No cervical lymphadenopathy. No thyromegaly Resp:Good air entry bilaterally, no added sounds  CVS: S1 S2 regular, no murmurs.  GI: Bowel sounds present, Non tender and not distended with no gaurding, rigidity or rebound.No  organomegaly Extremities: B/L Lower Ext shows some mild swelling mostly in the distal lower extremities Neurology:  speech clear,Non focal, sensation is grossly intact. Psychiatric: Normal judgment and  insight. Alert and oriented x 3. Normal mood. Musculoskeletal:No digital cyanosis Skin:No Rash, warm and dry Wounds:N/A  I have personally reviewed following labs and imaging studies  LABORATORY DATA: CBC: Recent Labs  Lab 11/15/17 1330 11/16/17 0219  WBC 3.4* 3.1*  NEUTROABS 2.5  --   HGB 9.6* 8.7*  HCT 30.8* 28.1*  MCV 100.3* 100.0  PLT 127* 112*    Basic Metabolic Panel: Recent Labs  Lab 11/15/17 1330 11/16/17 0219  NA 138 141  K 3.7 3.8  CL 103 105  CO2 28 28  GLUCOSE 88 103*  BUN 10 8  CREATININE 0.97 0.99  CALCIUM 8.1* 8.0*    GFR: Estimated Creatinine Clearance: 78.2 mL/min (by C-G formula based on SCr of 0.99 mg/dL).  Liver Function Tests: Recent Labs  Lab 11/15/17 1330  AST 25  ALT 25  ALKPHOS 71  BILITOT 0.5  PROT 5.9*  ALBUMIN 2.7*   No results for input(s): LIPASE, AMYLASE in the last 168 hours. No results for input(s): AMMONIA in the last 168 hours.  Coagulation Profile: Recent Labs  Lab 11/15/17 1816  INR 1.03    Cardiac Enzymes: No results for input(s): CKTOTAL, CKMB, CKMBINDEX, TROPONINI in the last 168 hours.  BNP (last 3 results) No results for input(s): PROBNP in the last 8760 hours.  HbA1C: No results for input(s): HGBA1C in the last 72 hours.  CBG: No results for input(s): GLUCAP in the last 168 hours.  Lipid Profile: No results for input(s): CHOL, HDL, LDLCALC, TRIG, CHOLHDL, LDLDIRECT in the last 72 hours.  Thyroid Function Tests: No results for input(s): TSH, T4TOTAL, FREET4, T3FREE, THYROIDAB in the last 72 hours.  Anemia Panel: No results for input(s): VITAMINB12, FOLATE, FERRITIN, TIBC, IRON, RETICCTPCT in the last 72 hours.  Urine analysis:    Component Value Date/Time   COLORURINE YELLOW 09/29/2017 [place] 09/29/2017 1247   LABSPEC 1.013 09/29/2017 1247   PHURINE 7.0 09/29/2017 1247   GLUCOSEU NEGATIVE 09/29/2017 1247   HGBUR NEGATIVE 09/29/2017 1247   BILIRUBINUR NEGATIVE  09/29/2017 1247   KETONESUR NEGATIVE 09/29/2017 1247   PROTEINUR NEGATIVE 09/29/2017 1247   UROBILINOGEN 4.0 (H) 02/11/2015 1409   NITRITE NEGATIVE 09/29/2017 1247   LEUKOCYTESUR NEGATIVE 09/29/2017 1247    Sepsis Labs: Lactic Acid, Venous    Component Value Date/Time   LATICACIDVEN 1.2 11/15/2016 2304    MICROBIOLOGY: No results found for this or any previous visit (from the past 240 hour(s)).  RADIOLOGY STUDIES/RESULTS: Dg Chest 2 View  Result Date: 11/15/2017 CLINICAL DATA:  LOWER extremity edema. EXAM: CHEST  2 VIEW COMPARISON:  11/15/2016 and prior radiographs FINDINGS: Cardiomediastinal silhouette is unremarkable. LEFT basilar pleuroparenchymal scarring again noted. There is no evidence of focal airspace disease, pulmonary edema, suspicious pulmonary nodule/mass, pleural effusion, or pneumothorax. No acute bony abnormalities are identified. IMPRESSION: No evidence of acute cardiopulmonary disease. Electronically Signed   By: Margarette Canada M.D.   On: 11/15/2017 13:34   US Venous Img Lower Bilateral  Result Date: 11/15/2017 CLINICAL DATA:  Bilateral lower extremity edema for the past week. Former smoker. History of metastatic renal cell carcinoma. Evaluate for DVT. EXAM: BILATERAL LOWER EXTREMITY VENOUS DOPPLER ULTRASOUND TECHNIQUE: Gray-scale sonography with graded compression, as well as color Doppler and duplex ultrasound were performed to evaluate the lower extremity deep venous systems  from the level of the common femoral vein and including the common femoral, femoral, profunda femoral, popliteal and calf veins including the posterior tibial, peroneal and gastrocnemius veins when visible. The superficial great saphenous vein was also interrogated. Spectral Doppler was utilized to evaluate flow at rest and with distal augmentation maneuvers in the common femoral, femoral and popliteal veins. COMPARISON:  None. FINDINGS: RIGHT LOWER EXTREMITY There is mixed echogenic occlusive DVT within  the right common femoral vein (image 4), extending to the proximal (image 13), mid (image 17) and distal (image 21) aspects of the right femoral vein and the right popliteal vein (image 25). The right deep femoral (image 12) and tibial veins vein appears patent where imaged. The right saphenofemoral junction and greater saphenous veins appear patent where imaged. Other Findings:  None. LEFT LOWER EXTREMITY There is hypoechoic nonocclusive thrombus within the left common femoral vein. There is hypoechoic occlusive DVT seen throughout the interrogated course of the left deep femoral vein (image 44), proximal (image 46), mid (image 51) and distal (image 55) aspects of the femoral vein, extending to involve the left popliteal vein (image 58) and imaged portions of the tibial veins. The left saphenofemoral junction and greater saphenous vein appear patent where imaged. Other Findings:  None. IMPRESSION: Examination is positive for extensive bilateral largely occlusive DVT as detailed above. Electronically Signed   By: Sandi Mariscal M.D.   On: 11/15/2017 14:21   Ct Angio Abd/pel W/ And/or W/o  Result Date: 11/15/2017 CLINICAL DATA:  Portal vein thrombosis. EXAM: CTA ABDOMEN AND PELVIS wITHOUT AND WITH CONTRAST TECHNIQUE: Multidetector CT imaging of the abdomen and pelvis was performed using the standard protocol during bolus administration of intravenous contrast. Multiplanar reconstructed images and MIPs were obtained and reviewed to evaluate the vascular anatomy. CONTRAST:  165mL ISOVUE-300 IOPAMIDOL (ISOVUE-300) INJECTION 61% COMPARISON:  CT scan of July 30, 2017. FINDINGS: VASCULAR Aorta: Normal caliber aorta without aneurysm, dissection, vasculitis or significant stenosis. Celiac: Patent without evidence of aneurysm, dissection, vasculitis or significant stenosis. SMA: Patent without evidence of aneurysm, dissection, vasculitis or significant stenosis. Renals: Left renal artery appears normal. Status post right  nephrectomy. IMA: Patent without evidence of aneurysm, dissection, vasculitis or significant stenosis. Inflow: Patent without evidence of aneurysm, dissection, vasculitis or significant stenosis. Proximal Outflow: Bilateral common femoral and visualized portions of the superficial and profunda femoral arteries are patent without evidence of aneurysm, dissection, vasculitis or significant stenosis. Veins: Filling defects are noted in both superficial femoral veins, as well as right common femoral vein, concerning for deep venous thrombosis. Doppler study is recommended for further evaluation. There is no definite evidence of thrombus seen in the IVC. Tumor from left renal mass is seen extending into the left renal vein. The splenic, superior mesenteric and portal veins are widely patent. Review of the MIP images confirms the above findings. NON-VASCULAR Lower chest: No acute abnormality. Hepatobiliary: No focal liver abnormality is seen. No gallstones, gallbladder wall thickening, or biliary dilatation. Pancreas: Stable 3.4 cm rounded abnormality seen in pancreatic body concerning for possible malignancy. No ductal dilatation is noted. Spleen: Normal in size without focal abnormality. Adrenals/Urinary Tract: Status post right nephrectomy. Adrenal glands are unremarkable. 5.3 x 4.3 cm mass is seen involving upper pole of left kidney, with extension of tumor into the left renal vein as previously noted. No hydronephrosis or renal obstruction is noted. Stomach/Bowel: Stomach is within normal limits. Appendix appears normal. No evidence of bowel wall thickening, distention, or inflammatory changes. Lymphatic: No significant adenopathy  is noted. Reproductive: Prostate is unremarkable. Other: No abdominal wall hernia or abnormality. No abdominopelvic ascites. Musculoskeletal: No acute or significant osseous findings. IMPRESSION: VASCULAR Thrombosis of both superficial femoral and right common femoral veins is noted  consistent with deep venous thrombosis. The IVC, portal, superior mesenteric and splenic veins are widely patent. NON-VASCULAR 5.3 x 4.3 cm left renal mass is noted with extension of tumor into the left renal vein. Electronically Signed   By: Marijo Conception, M.D.   On: 11/15/2017 21:11     LOS: 1 day   Oren Binet, MD  [facility] Pager:[phone]  If 7PM-7AM, please contact night-coverage [email or web address] Password [facility] 11/16/2017, 11:26 AM

## 2017-11-16 NOTE — Progress Notes (Addendum)
ANTICOAGULATION CONSULT NOTE - [place] for Heparin >> Lovenox Indication: bilateral DVT  Allergies  Allergen Reactions  . Amoxicillin Nausea And Vomiting  . Nsaids Other (See Comments)    Patient only has one kidney.   Marland Kitchen Zofran [Ondansetron Hcl] Hives  . Demerol [Meperidine] Rash    Patient Measurements: Height: 5\' 11"  (180.3 cm) Weight: 179 lb 7.3 oz (81.4 kg) IBW/kg (Calculated) : 75.3 Heparin Dosing Weight: 84.8 kg   Vital Signs: Temp: 97.9 F (36.6 C) (03/02 0533) Temp Source: Oral (03/02 0533) BP: 144/76 (03/02 0533) Pulse Rate: 75 (03/02 0533)  Labs: Recent Labs    11/15/17 1330 11/15/17 1816 11/16/17 0219  HGB 9.6*  --  8.7*  HCT 30.8*  --  28.1*  PLT 127*  --  112*  LABPROT  --  13.4  --   INR  --  1.03  --   HEPARINUNFRC  --   --  1.09*  CREATININE 0.97  --  0.99    Estimated Creatinine Clearance: 78.2 mL/min (by C-G formula based on SCr of 0.99 mg/dL).   Medical History: Past Medical History:  Diagnosis Date  . Anal fistula   . Anemia in chronic renal disease   . Barrett's esophagus    by EGD 11/02/2009  . Chronic pain   . CKD (chronic kidney disease), stage III ([place])   . Diverticulosis of colon    by colonoscopy 11/02/2009  . DJD (degenerative joint disease) of cervical spine   . GERD (gastroesophageal reflux disease)   . Gouty arthritis    per pt on 05-16-2016 stable (last bout bilatearl wrist 03/ 2017)  . History of diverticulitis of colon    w/ small perforation , no surgical intervention 06/ 2014  . History of peptic ulcer disease   . Iron deficiency anemia due to chronic blood loss   . Metastatic renal cell carcinoma [facility]) oncologist-  dr Alen Blew--  currently on oral chemo -- RCC w/ mets to pancreas slow progression per last ct 06/ 2017   dx 2010 right renal cell carcinoma (pT1b, Fuhrman grade 3/4) s/p  radical nephrectomy 06-27-2009/  dx left renal cell carcinoma w/ mets to pancreas 11/ 2013    . Nocturia   .  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 Wears glasses    Assessment: 67 yo M presented to [facility] with bilateral lower extremity swelling with doppler revealing extensive bilateral DVTs. Transferred to [facility] for evaluation by vascular surgery. No anticoag PTA or at [facility].   Seen by vascular >> no plans for intervention.  Pt is clinically improving on med mgmt.  Change heparin to Lovenox.  SCr 0.99  Goal of Therapy:  Anti-Xa level 0.6-1 units/ml 4hrs after LMWH dose given Monitor platelets by anticoagulation protocol: Yes   Plan:  Discontinue heparin and associated labs. Give Lovenox injection 1 hour after heparin gtt stopped. Lovenox 120mg  SQ q24h. CBC q72 hours.  Monitor for bleeding.  [employer], Pharm.D., BCPS Clinical Pharmacist Pager: [phone] Clinical phone for 11/16/2017 from 8:30-4:00 is [phone]. After 4pm, please call Main Rx [phone]) for assistance. 11/16/2017 11:45 AM

## 2017-11-16 NOTE — Care Management Note (Addendum)
Case Management Note  Patient Details  Name: Jeffrey Trujillo MRN: [id number] Date of Birth: [birth year]  Subjective/Objective:  67 yo M with hx of renal cell carcinoma on Cabometyx-presented to the hospital with significant swelling of his bilateral lower extremities, found to have extensive DVT.                Action/Plan: Pt to be d/c tomorrow on Lovenox - MD requesting the price for the Lovenox   Expected Discharge Date:  11/17/17               Expected Discharge Plan:  Home/Self Care  In-House Referral:     Discharge planning Services  CM Consult  Post Acute Care Choice:    Choice offered to:     DME Arranged:    DME Agency:     HH Arranged:    HH Agency:     Status of Service:  In process, will continue to follow  If discussed at Long Length of Stay Meetings, dates discussed:    Additional Comments: Met with pt at beside. He plans to return home with the support of his wife. Unable to contact pt's insurance on the weekends to check on prescription coverage. Explained to pt that the MD can e-scribe the prescription and we can contact the pharmacy to check on his co-payment. Pt's pharmacy is [facility]. in [place], [place] [address]. Pharmacy phone # is [phone]. Will f/u to check on pt's co-payment.   Norina Buzzard, RN 11/16/2017, 2:51 PM

## 2017-11-16 NOTE — Progress Notes (Signed)
ANTICOAGULATION CONSULT NOTE - Initial Consult  Pharmacy Consult for Heparin Indication: DVT  Allergies  Allergen Reactions  . Amoxicillin Nausea And Vomiting  . Nsaids Other (See Comments)    Patient only has one kidney.   Marland Kitchen Zofran [Ondansetron Hcl] Hives  . Demerol [Meperidine] Rash    Patient Measurements: Height: 5\' 11"  (180.3 cm) Weight: 179 lb 7.3 oz (81.4 kg) IBW/kg (Calculated) : 75.3 Heparin Dosing Weight: 84.8 kg   Vital Signs: Temp: 98.6 F (37 C) (03/01 2210) Temp Source: Oral (03/01 2210) BP: 157/76 (03/01 2210) Pulse Rate: 73 (03/01 2210)  Labs: Recent Labs    11/15/17 1330 11/15/17 1816 11/16/17 0219  HGB 9.6*  --  8.7*  HCT 30.8*  --  28.1*  PLT 127*  --  PENDING  LABPROT  --  13.4  --   INR  --  1.03  --   HEPARINUNFRC  --   --  1.09*  CREATININE 0.97  --  0.99    Estimated Creatinine Clearance: 78.2 mL/min (by C-G formula based on SCr of 0.99 mg/dL).   Medical History: Past Medical History:  Diagnosis Date  . Anal fistula   . Anemia in chronic renal disease   . Barrett's esophagus    by EGD 11/02/2009  . Chronic pain   . CKD (chronic kidney disease), stage III ([place])   . Diverticulosis of colon    by colonoscopy 11/02/2009  . DJD (degenerative joint disease) of cervical spine   . GERD (gastroesophageal reflux disease)   . Gouty arthritis    per pt on 05-16-2016 stable (last bout bilatearl wrist 03/ 2017)  . History of diverticulitis of colon    w/ small perforation , no surgical intervention 06/ 2014  . History of peptic ulcer disease   . Iron deficiency anemia due to chronic blood loss   . Metastatic renal cell carcinoma [facility]) oncologist-  dr Alen Blew--  currently on oral chemo -- RCC w/ mets to pancreas slow progression per last ct 06/ 2017   dx 2010 right renal cell carcinoma (pT1b, Fuhrman grade 3/4) s/p  radical nephrectomy 06-27-2009/  dx left renal cell carcinoma w/ mets to pancreas 11/ 2013    . Nocturia   . Thrombocytopenia  ([place])    secondary to oral chemo  . Wears glasses    Assessment: 16 yom presented to [facility] with bilateral lower extremity swelling with doppler revealing extensive bilateral DVTs. Transferred to [facility] for evaluation by vascular surgery. No anticoag PTA or at [facility].   Hgb 9.6, plts low 127. Renal function is stable (Scr 0.97). No signs/symptoms of bleeding.  Update 3/2 AM: initial heparin level is elevated at 1.09, no issues per RN  Goal of Therapy:  Heparin level 0.3-0.7 units/ml Monitor platelets by anticoagulation protocol: Yes   Plan:  Hold heparin x 1 hr Re-start heparin at 1250 units/hr at 0400 1200 HL Monitor for bleeding  Narda Bonds, PharmD, BCPS Clinical Pharmacist Phone: [phone]

## 2017-11-16 NOTE — Consult Note (Signed)
Requested by:  Dr. Melina Copa [facility] ED)  Reason for consultation: BLE DVT    History of Present Illness   Jeffrey Trujillo is a 67 y.o. (1951/09/09) male s/p R nephrectomy with known metastatic L RCC who presents with cc: bilateral leg swelling.  This patient has had a history of intermittent bilateral leg swelling.  This swelling has been responsive to diurectics in the past.  The notice recent recurrence in the last few days of bilateral leg swelling, worse than his usual.  He notes bursting sensation in both legs.  No frank ulcers.  His BLE Venous duplex was consistent with BLE DVT.  Reported at the [facility] ED the patient had 3+ edema.  After conversation with Dr. Donzetta Matters, the patient was transferred to [facility] for further work-up and possible thrombolytic intervention.  This patient has not been wearing compression stockings.  He denies any recent trauma, surgery, extended travel, or other eliciting factors for thrombosis.  There is no family history of venous disorders or thrombophilia.  Pt notes improvement in leg sx since admission to hospital and start of heparin drip.  Past Medical History:  Diagnosis Date  . Anal fistula   . Anemia in chronic renal disease   . Barrett's esophagus    by EGD 11/02/2009  . Chronic pain   . CKD (chronic kidney disease), stage III ([place])   . Diverticulosis of colon    by colonoscopy 11/02/2009  . DJD (degenerative joint disease) of cervical spine   . GERD (gastroesophageal reflux disease)   . Gouty arthritis    per pt on 05-16-2016 stable (last bout bilatearl wrist 03/ 2017)  . History of diverticulitis of colon    w/ small perforation , no surgical intervention 06/ 2014  . History of peptic ulcer disease   . Iron deficiency anemia due to chronic blood loss   . 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 Nocturia   . 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 Wears glasses     Past Surgical History:  Procedure Laterality Date  . COLONOSCOPY  Feb 2011   [name]: single anal papilla, pan-colonic diverticula  . COLONOSCOPY  09/25/2012   RMR: Colonic diverticulosis. Suspect diverticular bleeding-hepatic flexure tic-sealed  . ESOPHAGOGASTRODUODENOSCOPY  Feb 2011   [name]: salmon-colored epithelium consistent with Barrett's, nodular antral erosions, small hiatal hernia, negative H.pylori  . ESOPHAGOGASTRODUODENOSCOPY  09/25/2012   VFI:EPPIRJJ'O esophagus. Small hiatal hernia. Antral erosions of doubtful clinical significance, no biopsies  . ESOPHAGOGASTRODUODENOSCOPY N/A 03/11/2013   SLF: Barrett's esophagus/Single ulcer in the gastric antrum-PROBABLE SOURCE FOR BLACK STOOL/ Chronic gastritis in the gastric antrum  . ESOPHAGOGASTRODUODENOSCOPY N/A 10/11/2014   Dr. [name] barrett's s/p biopsy. Hiatal hernia. Biopsy with intestinal metaplasia, glandular atypia indefinite for low grade dysplasia noted as well  . ESOPHAGOGASTRODUODENOSCOPY N/A 01/19/2015   [name]: Barrett's no dysplasia  . EUS  08/07/2012   Procedure: UPPER ENDOSCOPIC ULTRASOUND (EUS) LINEAR;  Surgeon: [name], MD;  Location: [facility] ENDOSCOPY;  Service: Endoscopy;  Laterality: N/A;  . EUS  08/21/2012   Procedure: UPPER ENDOSCOPIC ULTRASOUND (EUS) LINEAR;  Surgeon: [name], MD;  Location:  [facility] ENDOSCOPY;  Service: Endoscopy;  Laterality: N/A;  . INCISION AND DRAINAGE ABSCESS N/A 11/13/2015   Procedure: INCISION AND DRAINAGE ABSCESS;  Surgeon: Aviva Signs, MD;  Location: [facility] ORS;  Service: General;  Laterality: N/A;  . INGUINAL HERNIA REPAIR Bilateral 1990's  . LAPAROSCOPIC NEPHRECTOMY Right 06/27/2009   dr Alinda Money  . LUNG REMOVAL, PARTIAL  1970   left lower lobe, benign     Social  History   Socioeconomic History  . Marital status: Married    Spouse name: Not on file  . Number of children: Not on file  . Years of education: Not on file  . Highest education level: Not on file  Social Needs  . Financial resource strain: Not on file  . Food insecurity - worry: Not on file  . Food insecurity - inability: Not on file  . Transportation needs - medical: Not on file  . Transportation needs - non-medical: Not on file  Occupational History  . 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 Smoking status: Former Smoker    Packs/day: 0.25    Years: 4.00    Pack years: 1.00    Types: Cigars    Last attempt to quit: 05/16/2014    Years since quitting: 3.5  . Smokeless tobacco: Current User    Types: Chew  Substance and Sexual Activity  . Alcohol use: Yes    Alcohol/week: 2.4 - 3.0 oz    Types: 2 - 3 Cans of beer, 2 Shots of liquor per week    Comment: binges intermittently, 1/5 vodka daily until it makes him sick, last ETOH was about 3 months ago  . Drug use: No  . Sexual activity: Yes  Other Topics Concern  . Not on file  Social History Narrative  . Not on file    Family History  Problem Relation Age of Onset  . Lung cancer Brother   . Prostate cancer Brother   . Lumbar disc disease Brother   . Prostate cancer Brother   . Colon cancer Neg Hx     Current Facility-Administered Medications  Medication Dose Route Frequency Provider Last Rate Last Dose  . acetaminophen (TYLENOL) tablet 650 mg  650 mg Oral Q6H PRN Opyd, Ilene Qua, MD       Or  . acetaminophen (TYLENOL) suppository 650 mg  650 mg Rectal Q6H PRN Opyd, Ilene Qua, MD      . allopurinol (ZYLOPRIM) tablet 50 mg  50 mg Oral BID Opyd, Ilene Qua, MD   50 mg at 11/16/17 0830  . bisacodyl (DULCOLAX) EC tablet 5 mg  5 mg Oral Daily PRN Opyd, Ilene Qua, MD      . famotidine (PEPCID) IVPB 20 mg premix  20 mg Intravenous Q12H Opyd, Ilene Qua,  MD 100 mL/hr at 11/16/17 0831 20 mg at 11/16/17 0831  . heparin ADULT infusion 100 units/mL (25000 units/213mL sodium chloride 0.45%)  1,250 Units/hr Intravenous Continuous Erenest Blank, RPH 12.5 mL/hr at 11/16/17 0922 1,250 Units/hr at 11/16/17 0922  . HYDROcodone-acetaminophen (NORCO/VICODIN) 5-325 MG per tablet 1-2 tablet  1-2 tablet Oral Q4H PRN Opyd, Ilene Qua, MD   1 tablet at 11/16/17 0540  . HYDROmorphone (DILAUDID) injection 1 mg  1 mg Intravenous Q4H PRN Opyd, Ilene Qua, MD   1 mg at 11/16/17 0843  . promethazine (PHENERGAN) tablet 12.5 mg  12.5 mg Oral Q6H PRN Opyd, Ilene Qua,  MD      . senna-docusate (Senokot-S) tablet 1 tablet  1 tablet Oral QHS PRN Opyd, Ilene Qua, MD      . sodium chloride flush (NS) 0.9 % injection 3 mL  3 mL Intravenous Q12H Opyd, Ilene Qua, MD   3 mL at 11/15/17 2355    Allergies  Allergen Reactions  . Amoxicillin Nausea And Vomiting  . Nsaids Other (See Comments)    Patient only has one kidney.   Marland Kitchen Zofran [Ondansetron Hcl] Hives  . Demerol [Meperidine] Rash    REVIEW OF SYSTEMS (negative unless checked):   Cardiac:  []  Chest pain or chest pressure? [x]  Shortness of breath upon activity? []  Shortness of breath when lying flat? []  Irregular heart rhythm?  Vascular:  [x]  Pain in calf, thigh, or hip brought on by walking? []  Pain in feet at night that wakes you up from your sleep? [x]  Blood clot in your veins? [x]  Leg swelling?  Pulmonary:  []  Oxygen at home? []  Productive cough? []  Wheezing?  Neurologic:  []  Sudden weakness in arms or legs? []  Sudden numbness in arms or legs? []  Sudden onset of difficult speaking or slurred speech? []  Temporary loss of vision in one eye? []  Problems with dizziness?  Gastrointestinal:  []  Blood in stool? []  Vomited blood?  Genitourinary:  []  Burning when urinating? []  Blood in urine?  Psychiatric:  []  Major depression  Hematologic:  []  Bleeding problems? []  Problems with blood  clotting?  Dermatologic:  []  Rashes or ulcers?  Constitutional:  []  Fever or chills?  Ear/Nose/Throat:  []  Change in hearing? []  Nose bleeds? []  Sore throat?  Musculoskeletal:  []  Back pain? []  Joint pain? []  Muscle pain?   Physical Examination     Vitals:   11/15/17 1945 11/15/17 2045 11/15/17 2210 11/16/17 0533  BP: 139/76 127/88 (!) 157/76 (!) 144/76  Pulse: 71 78 73 75  Resp: 15 12 16 20   Temp:   98.6 F (37 C) 97.9 F (36.6 C)  TempSrc:   Oral Oral  SpO2: 99% 100% 100% 100%  Weight:   179 lb 7.3 oz (81.4 kg)   Height:   5\' 11"  (1.803 m)    Body mass index is 25.03 kg/m.  General Alert, O x 3, WD, NAD  Head [place]/AT,    Ear/Nose/ Throat Hearing grossly intact, nares without erythema or drainage, oropharynx without Erythema or Exudate, Mallampati score: 3,   Eyes PERRLA, EOMI,    Neck Supple, mid-line trachea,    Pulmonary Sym exp, good B air movt, CTA B  Cardiac RRR, Nl S1, S2, no Murmurs, No rubs, No S3,S4  Vascular Vessel Right Left  Radial Palpable Palpable  Brachial Palpable Palpable  Carotid Palpable, No Bruit Palpable, No Bruit  Aorta Not palpable N/A  Femoral Palpable Palpable  Popliteal Not palpable Not palpable  PT Not palpable Faintly palpable  DP Not palpable Faintly palpable    Gastro- intestinal soft, non-distended, non-tender to palpation, No guarding or rebound, no HSM, no masses, no CVAT B, No palpable prominent aortic pulse,    Musculo- skeletal M/S 5/5 throughout  , Extremities without ischemic changes  , Non-pitting edema present: R 1-2+, L 2-3+, Varicosities present: B, Lipodermatosclerosis present: mild LDS L>R  Neurologic Cranial nerves 2-12 intact , Pain and light touch intact in extremities , Motor exam as listed above  Psychiatric Judgement intact, Mood & affect appropriate for pt's clinical situation  Dermatologic See M/S exam for extremity exam, No rashes otherwise noted  Lymphatic  Palpable lymph nodes: None     Non-invasive Vascular Imaging     BLE Venous Duplex (11/16/2017) RIGHT LOWER EXTREMITY  There is mixed echogenic occlusive DVT within the right common femoral vein (image 4), extending to the proximal (image 13), mid (image 17) and distal (image 21) aspects of the right femoral vein and the right popliteal vein (image 25).  The right deep femoral (image 12) and tibial veins vein appears patent where imaged.  The right saphenofemoral junction and greater saphenous veins appear patent where imaged.  Other Findings:  None.  LEFT LOWER EXTREMITY  There is hypoechoic nonocclusive thrombus within the left common femoral vein.  There is hypoechoic occlusive DVT seen throughout the interrogated course of the left deep femoral vein (image 44), proximal (image 46), mid (image 51) and distal (image 55) aspects of the femoral vein, extending to involve the left popliteal vein (image 58) and imaged portions of the tibial veins.  The left saphenofemoral junction and greater saphenous vein appear patent where imaged.  Other Findings:  None.  IMPRESSION: Examination is positive for extensive bilateral largely occlusive DVT as detailed above.   Laboratory   CBC CBC Latest Ref Rng & Units 11/16/2017 11/15/2017 10/03/2017  WBC 4.0 - 10.5 K/uL 3.1(L) 3.4(L) 6.6  Hemoglobin 13.0 - 17.0 g/dL 8.7(L) 9.6(L) 9.8(L)  Hematocrit 39.0 - 52.0 % 28.1(L) 30.8(L) 29.5(L)  Platelets 150 - 400 K/uL 112(L) 127(L) 164    BMP BMP Latest Ref Rng & Units 11/16/2017 11/15/2017 10/03/2017  Glucose 65 - 99 mg/dL 103(H) 88 141(H)  BUN 6 - 20 mg/dL 8 10 20   Creatinine 0.61 - 1.24 mg/dL 0.99 0.97 1.25  Sodium 135 - 145 mmol/L 141 138 140  Potassium 3.5 - 5.1 mmol/L 3.8 3.7 4.9  Chloride 101 - 111 mmol/L 105 103 107  CO2 22 - 32 mmol/L 28 28 28   Calcium 8.9 - 10.3 mg/dL 8.0(L) 8.1(L) 8.5    Coagulation Lab Results  Component Value Date   INR 1.03 11/15/2017   INR 1.08 11/15/2016   INR 1.19  11/12/2015   No results found for: PTT  Lipids No results found for: CHOL, TRIG, HDL, CHOLHDL, VLDL, LDLCALC, LDLDIRECT   Radiology     Dg Chest 2 View  Result Date: 11/15/2017 CLINICAL DATA:  LOWER extremity edema. EXAM: CHEST  2 VIEW COMPARISON:  11/15/2016 and prior radiographs FINDINGS: Cardiomediastinal silhouette is unremarkable. LEFT basilar pleuroparenchymal scarring again noted. There is no evidence of focal airspace disease, pulmonary edema, suspicious pulmonary nodule/mass, pleural effusion, or pneumothorax. No acute bony abnormalities are identified. IMPRESSION: No evidence of acute cardiopulmonary disease. Electronically Signed   By: Margarette Canada M.D.   On: 11/15/2017 13:34   US Venous Img Lower Bilateral  Result Date: 11/15/2017 CLINICAL DATA:  Bilateral lower extremity edema for the past week. Former smoker. History of metastatic renal cell carcinoma. Evaluate for DVT. EXAM: BILATERAL LOWER EXTREMITY VENOUS DOPPLER ULTRASOUND TECHNIQUE: Gray-scale sonography with graded compression, as well as color Doppler and duplex ultrasound were performed to evaluate the lower extremity deep venous systems from the level of the common femoral vein and including the common femoral, femoral, profunda femoral, popliteal and calf veins including the posterior tibial, peroneal and gastrocnemius veins when visible. The superficial great saphenous vein was also interrogated. Spectral Doppler was utilized to evaluate flow at rest and with distal augmentation maneuvers in the common femoral, femoral and popliteal veins. COMPARISON:  None. FINDINGS: RIGHT LOWER EXTREMITY There is mixed echogenic  occlusive DVT within the right common femoral vein (image 4), extending to the proximal (image 13), mid (image 17) and distal (image 21) aspects of the right femoral vein and the right popliteal vein (image 25). The right deep femoral (image 12) and tibial veins vein appears patent where imaged. The right  saphenofemoral junction and greater saphenous veins appear patent where imaged. Other Findings:  None. LEFT LOWER EXTREMITY There is hypoechoic nonocclusive thrombus within the left common femoral vein. There is hypoechoic occlusive DVT seen throughout the interrogated course of the left deep femoral vein (image 44), proximal (image 46), mid (image 51) and distal (image 55) aspects of the femoral vein, extending to involve the left popliteal vein (image 58) and imaged portions of the tibial veins. The left saphenofemoral junction and greater saphenous vein appear patent where imaged. Other Findings:  None. IMPRESSION: Examination is positive for extensive bilateral largely occlusive DVT as detailed above. Electronically Signed   By: Sandi Mariscal M.D.   On: 11/15/2017 14:21   Ct Angio Abd/pel W/ And/or W/o  Result Date: 11/15/2017 CLINICAL DATA:  Portal vein thrombosis. EXAM: CTA ABDOMEN AND PELVIS wITHOUT AND WITH CONTRAST TECHNIQUE: Multidetector CT imaging of the abdomen and pelvis was performed using the standard protocol during bolus administration of intravenous contrast. Multiplanar reconstructed images and MIPs were obtained and reviewed to evaluate the vascular anatomy. CONTRAST:  162mL ISOVUE-300 IOPAMIDOL (ISOVUE-300) INJECTION 61% COMPARISON:  CT scan of July 30, 2017. FINDINGS: VASCULAR Aorta: Normal caliber aorta without aneurysm, dissection, vasculitis or significant stenosis. Celiac: Patent without evidence of aneurysm, dissection, vasculitis or significant stenosis. SMA: Patent without evidence of aneurysm, dissection, vasculitis or significant stenosis. Renals: Left renal artery appears normal. Status post right nephrectomy. IMA: Patent without evidence of aneurysm, dissection, vasculitis or significant stenosis. Inflow: Patent without evidence of aneurysm, dissection, vasculitis or significant stenosis. Proximal Outflow: Bilateral common femoral and visualized portions of the superficial and  profunda femoral arteries are patent without evidence of aneurysm, dissection, vasculitis or significant stenosis. Veins: Filling defects are noted in both superficial femoral veins, as well as right common femoral vein, concerning for deep venous thrombosis. Doppler study is recommended for further evaluation. There is no definite evidence of thrombus seen in the IVC. Tumor from left renal mass is seen extending into the left renal vein. The splenic, superior mesenteric and portal veins are widely patent. Review of the MIP images confirms the above findings. NON-VASCULAR Lower chest: No acute abnormality. Hepatobiliary: No focal liver abnormality is seen. No gallstones, gallbladder wall thickening, or biliary dilatation. Pancreas: Stable 3.4 cm rounded abnormality seen in pancreatic body concerning for possible malignancy. No ductal dilatation is noted. Spleen: Normal in size without focal abnormality. Adrenals/Urinary Tract: Status post right nephrectomy. Adrenal glands are unremarkable. 5.3 x 4.3 cm mass is seen involving upper pole of left kidney, with extension of tumor into the left renal vein as previously noted. No hydronephrosis or renal obstruction is noted. Stomach/Bowel: Stomach is within normal limits. Appendix appears normal. No evidence of bowel wall thickening, distention, or inflammatory changes. Lymphatic: No significant adenopathy is noted. Reproductive: Prostate is unremarkable. Other: No abdominal wall hernia or abnormality. No abdominopelvic ascites. Musculoskeletal: No acute or significant osseous findings. IMPRESSION: VASCULAR Thrombosis of both superficial femoral and right common femoral veins is noted consistent with deep venous thrombosis. The IVC, portal, superior mesenteric and splenic veins are widely patent. NON-VASCULAR 5.3 x 4.3 cm left renal mass is noted with extension of tumor into the left renal  vein. Electronically Signed   By: Marijo Conception, M.D.   On: 11/15/2017 21:11     I reviewed the CTA/V abd/pelvis.  I agree that the IVC appears to be patent.  I also don't see any iliac venous extension of the DVT.   Medical Decision Making   WOODWARD KLEM is a 67 y.o. male who presents with: acute BLE DVT of known etiology, s/p R Nephrectomy, L RCC with known metastasis, prior history of chronic kidney disease stage 3   ATTRACT demonstrated no benefit to femoropopliteal DVT intervention with CDT or PMT.    In this patient with solitary diseased kidney with RCC, PMT might be enough to put the patient into ESRD.  Additionally, the patient clinically has improved with simple anticoagulation, so I would manage this patient medically.  Defer the choice of anticoagulation to primary team.  Might get Oncology's input on whether they think his DVT are related to his RCC.  If so, Lovenox might be a better choice.  Keep both legs elevated above the level of the heart.  BLE compression stockings.  Thank you for allowing [place] to participate in this patient's care.  Will be available as needed.   Adele Barthel, MD, FACS [facility] Office: [phone] Pager: [phone]  11/16/2017, 9:45 AM

## 2017-11-17 MED ORDER — ENOXAPARIN SODIUM 120 MG/0.8ML ~~LOC~~ SOLN: 120.0000 mg | SUBCUTANEOUS | 0 refills | Status: AC

## 2017-11-17 MED ORDER — HYDROCODONE-ACETAMINOPHEN 5-325 MG PO TABS: 1.0000 | ORAL_TABLET | Freq: Four times a day (QID) | ORAL | 0 refills | Status: DC | PRN

## 2017-11-17 MED ORDER — ENOXAPARIN (LOVENOX) PATIENT EDUCATION KIT
PACK | Freq: Once | Status: AC
  Administered 2017-11-17: 08:00:00
  Filled 2017-11-17: qty 1

## 2017-11-17 NOTE — Discharge Summary (Addendum)
PATIENT DETAILS Name: Jeffrey Trujillo Age: 67 y.o. Sex: male Date of Birth: July 14, 1951 MRN: [id number]. Admitting Physician: Vianne Bulls, MD [place] Date: 11/15/2017 Discharge date: 11/17/2017  Recommendations for Outpatient Follow-up:  1. Follow up with PCP in 1-2 weeks 2. Please obtain BMP/CBC in one week 3. New medication-Lovenox-Cabometyx on hold  Admitted From:  Home   Disposition: [place]: No  Equipment/Devices: None  Discharge Condition: Stable  CODE STATUS: FULL CODE  Diet recommendation:  Regular  Brief Summary: See H&P, Labs, Consult and Test reports for all details in brief, Patient is a 67 y.o. male with history of renal cell carcinoma on Cabometyx-presented to the hospital with significant swelling of his bilateral lower extremities, found to have extensive DVT-started on IV heparin and admitted to the hospitalist service.  Evaluated by vascular surgery-and deemed not to require catheter guided thrombolytic therapy.  Below for further details  Brief Hospital Course: Bilateral lower extremity DVT: Probably related to hypercoagulable state of malignancy-appreciate vascular surgery input-felt not to require catheter guided thrombolytic therapy-spoke with oncology over the phone (Dr Kale)-recommend patient be transitioned to Lovenox.Given lack of shortness of breath and chest pain-doubt pulmonary embolism-he is on anticoagulation in any event-further workup will not change management.  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  Spoke with Dr. Rebekah [place] we continue to hold Cabometyx (can provoke VTE)-until patient has been seen by memory  oncologist.  Mild pancytopenia: Stable for now-suspect will be worked up in the outpatient setting-probably related to ongoing VGEF inhibitor for renal cell carcinoma  Gout: No evidence of flare-continue with allopurinol  Procedures/Studies: None  Discharge Diagnoses:  Principal Problem:   DVT, bilateral lower limbs ([place]) Active Problems:   Leukopenia   Anemia of chronic disease   History of renal cell cancer   Discharge Instructions:  Activity:  As tolerated with Full fall precautions use walker/cane & assistance as needed  Discharge Instructions    Diet - low sodium heart healthy   Complete by:  As directed    Discharge instructions   Complete by:  As directed    Follow with Primary MD  [facility] in 1 week, and new oncologist-[name] within the next 2-3 days.   You have been started on blood thinners-do not take medications like ibuprofen, naproxen, Advil and Aleve.  Do not take a chemotherapy medication unless you have been seen by your primary oncologist  Please get a complete blood count and chemistry panel checked by your Primary MD at your next visit, and again as instructed by your Primary MD.  Get Medicines reviewed and adjusted: Please take all your medications with you for your next visit with your Primary MD  Laboratory/radiological data: Please request your Primary MD to go over all hospital tests and procedure/radiological results at the follow up, please ask your Primary MD to get all Hospital records sent to his/her office.  In some cases, they will be blood work, cultures and biopsy results pending at the time of your discharge. Please request that your primary care M.D. follows up on these results.  Also Note the following: If you experience worsening of your admission symptoms, develop shortness of breath, life threatening emergency, suicidal or homicidal thoughts  you must seek medical attention immediately by calling  911 or calling your MD immediately  if symptoms less severe.  You must read complete instructions/literature along with all the possible adverse reactions/side effects for all the Medicines you take and that have been prescribed to you. Take any new Medicines after you have completely understood and accpet all the possible adverse reactions/side effects.   Do not drive when taking Pain medications or sleeping medications (Benzodaizepines)  Do not take more than prescribed Pain, Sleep and Anxiety Medications. It is not advisable to combine anxiety,sleep and pain medications without talking with your primary care practitioner  Special Instructions: If you have smoked or chewed Tobacco  in the last 2 yrs please stop smoking, stop any regular Alcohol  and or any Recreational drug use.  Wear Seat belts while driving.  Please note: You were cared for by a hospitalist during your hospital stay. Once you are discharged, your primary care physician will handle any further medical issues. Please note that NO REFILLS for any discharge medications will be authorized once you are discharged, as it is imperative that you return to your primary care physician (or establish a relationship with a primary care physician if you do not have one) for your post hospital discharge needs so that they can reassess your need for medications and monitor your lab values.   Increase activity slowly   Complete by:  As directed      Allergies as of 11/17/2017      Reactions   Amoxicillin Nausea And Vomiting   Nsaids Other (See Comments)   Patient only has one kidney.    Zofran [ondansetron Hcl] Hives   Demerol [meperidine] Rash      Medication List    STOP taking these medications   acetaminophen 500 MG tablet Commonly known as:  TYLENOL   CABOMETYX 40 MG Tabs Generic drug:  cabozantinib S-Malate   furosemide 20 MG tablet Commonly known as:  LASIX   oxyCODONE-acetaminophen 5-325 MG tablet Commonly known as:   PERCOCET/ROXICET   potassium chloride SA 20 MEQ tablet Commonly known as:  K-DUR,KLOR-CON   predniSONE 20 MG tablet Commonly known as:  DELTASONE     TAKE these medications   allopurinol 100 MG tablet Commonly known as:  ZYLOPRIM Take 0.5 tablets (50 mg total) by mouth 2 (two) times daily. What changed:  how much to take   enoxaparin 120 MG/0.8ML injection Commonly known as:  LOVENOX Inject 0.8 mLs (120 mg total) into the skin daily.   HYDROcodone-acetaminophen 5-325 MG tablet Commonly known as:  NORCO/VICODIN Take 1 tablet by mouth every 6 (six) hours as needed. What changed:  when to take this      Follow-up Information    [facility] Schedule an appointment as soon as possible for a visit in 1 week(s).   Contact information: [address] [place] [address] [phone]        [name], MD. Schedule an appointment as soon as possible for a visit in 3 day(s).   Specialty:  Oncology Contact information: [address] [place] [address] [phone]          Allergies  Allergen Reactions  . Amoxicillin Nausea And Vomiting  . Nsaids Other (See Comments)    Patient only has one kidney.   Marland Kitchen Zofran [Ondansetron Hcl] Hives  . Demerol [Meperidine] Rash    Consultations:   vascular surgery  Other Procedures/Studies: Dg Chest 2 View  Result Date: 11/15/2017 CLINICAL DATA:  LOWER extremity edema. EXAM: CHEST  2 VIEW COMPARISON:  11/15/2016 and prior radiographs FINDINGS: Cardiomediastinal silhouette is unremarkable. LEFT basilar pleuroparenchymal scarring again noted. There is no evidence of focal airspace disease, pulmonary edema, suspicious pulmonary nodule/mass, pleural effusion, or pneumothorax. No acute bony abnormalities are identified. IMPRESSION: No evidence of acute cardiopulmonary disease. Electronically Signed   By: Margarette Canada M.D.   On: 11/15/2017 13:34   US Venous Img Lower Bilateral  Result Date:  11/15/2017 CLINICAL DATA:  Bilateral lower extremity edema for the past week. Former smoker. History of metastatic renal cell carcinoma. Evaluate for DVT. EXAM: BILATERAL LOWER EXTREMITY VENOUS DOPPLER ULTRASOUND TECHNIQUE: Gray-scale sonography with graded compression, as well as color Doppler and duplex ultrasound were performed to evaluate the lower extremity deep venous systems from the level of the common femoral vein and including the common femoral, femoral, profunda femoral, popliteal and calf veins including the posterior tibial, peroneal and gastrocnemius veins when visible. The superficial great saphenous vein was also interrogated. Spectral Doppler was utilized to evaluate flow at rest and with distal augmentation maneuvers in the common femoral, femoral and popliteal veins. COMPARISON:  None. FINDINGS: RIGHT LOWER EXTREMITY There is mixed echogenic occlusive DVT within the right common femoral vein (image 4), extending to the proximal (image 13), mid (image 17) and distal (image 21) aspects of the right femoral vein and the right popliteal vein (image 25). The right deep femoral (image 12) and tibial veins vein appears patent where imaged. The right saphenofemoral junction and greater saphenous veins appear patent where imaged. Other Findings:  None. LEFT LOWER EXTREMITY There is hypoechoic nonocclusive thrombus within the left common femoral vein. There is hypoechoic occlusive DVT seen throughout the interrogated course of the left deep femoral vein (image 44), proximal (image 46), mid (image 51) and distal (image 55) aspects of the femoral vein, extending to involve the left popliteal vein (image 58) and imaged portions of the tibial veins. The left saphenofemoral junction and greater saphenous vein appear patent where imaged. Other Findings:  None. IMPRESSION: Examination is positive for extensive bilateral largely occlusive DVT as detailed above. Electronically Signed   By: Sandi Mariscal M.D.   On:  11/15/2017 14:21   Ct Angio Abd/pel W/ And/or W/o  Result Date: 11/15/2017 CLINICAL DATA:  Portal vein thrombosis. EXAM: CTA ABDOMEN AND PELVIS wITHOUT AND WITH CONTRAST TECHNIQUE: Multidetector CT imaging of the abdomen and pelvis was performed using the standard protocol during bolus administration of intravenous contrast. Multiplanar reconstructed images and MIPs were obtained and reviewed to evaluate the vascular anatomy. CONTRAST:  118mL ISOVUE-300 IOPAMIDOL (ISOVUE-300) INJECTION 61% COMPARISON:  CT scan of July 30, 2017. FINDINGS: VASCULAR Aorta: Normal caliber aorta without aneurysm, dissection, vasculitis or significant stenosis. Celiac: Patent without evidence of aneurysm, dissection, vasculitis or significant stenosis. SMA: Patent without evidence of aneurysm, dissection, vasculitis or significant stenosis. Renals: Left renal artery appears normal. Status post right nephrectomy. IMA: Patent without evidence of aneurysm, dissection, vasculitis or significant stenosis. Inflow: Patent without evidence of aneurysm, dissection, vasculitis or significant stenosis. Proximal Outflow: Bilateral common femoral and visualized portions of the superficial and profunda femoral arteries are patent without evidence of aneurysm, dissection, vasculitis or significant stenosis. Veins: Filling defects are noted in both superficial femoral veins, as well as right common femoral vein, concerning for deep venous thrombosis. Doppler study is recommended for further evaluation. There is no definite evidence of thrombus seen in the IVC. Tumor from left renal mass  is seen extending into the left renal vein. The splenic, superior mesenteric and portal veins are widely patent. Review of the MIP images confirms the above findings. NON-VASCULAR Lower chest: No acute abnormality. Hepatobiliary: No focal liver abnormality is seen. No gallstones, gallbladder wall thickening, or biliary dilatation. Pancreas: Stable 3.4 cm rounded  abnormality seen in pancreatic body concerning for possible malignancy. No ductal dilatation is noted. Spleen: Normal in size without focal abnormality. Adrenals/Urinary Tract: Status post right nephrectomy. Adrenal glands are unremarkable. 5.3 x 4.3 cm mass is seen involving upper pole of left kidney, with extension of tumor into the left renal vein as previously noted. No hydronephrosis or renal obstruction is noted. Stomach/Bowel: Stomach is within normal limits. Appendix appears normal. No evidence of bowel wall thickening, distention, or inflammatory changes. Lymphatic: No significant adenopathy is noted. Reproductive: Prostate is unremarkable. Other: No abdominal wall hernia or abnormality. No abdominopelvic ascites. Musculoskeletal: No acute or significant osseous findings. IMPRESSION: VASCULAR Thrombosis of both superficial femoral and right common femoral veins is noted consistent with deep venous thrombosis. The IVC, portal, superior mesenteric and splenic veins are widely patent. NON-VASCULAR 5.3 x 4.3 cm left renal mass is noted with extension of tumor into the left renal vein. Electronically Signed   By: Marijo Conception, M.D.   On: 11/15/2017 21:11     TODAY-DAY OF DISCHARGE:  Subjective:   Jeffrey Trujillo today has no headache,no chest abdominal pain,no new weakness tingling or numbness, feels much better wants to go home today.   Objective:   Blood pressure 128/77, pulse 80, temperature 98.2 F (36.8 C), temperature source Oral, resp. rate 18, height 5\' 11"  (1.803 m), weight 81.4 kg (179 lb 7.3 oz), SpO2 94 %.  Intake/Output Summary (Last 24 hours) at 11/17/2017 1218 Last data filed at 11/17/2017 0541 Gross per 24 hour  Intake 260 ml  Output 1100 ml  Net -840 ml   Filed Weights   11/15/17 1232 11/15/17 2210  Weight: 84.8 kg (187 lb) 81.4 kg (179 lb 7.3 oz)    Exam: Awake Alert, Oriented *3, No new F.N deficits, Normal affect [place].AT,PERRAL Supple Neck,No JVD, No cervical  lymphadenopathy appriciated.  Symmetrical Chest wall movement, Good air movement bilaterally, CTAB RRR,No Gallops,Rubs or new Murmurs, No Parasternal Heave +ve B.Sounds, Abd Soft, Non tender, No organomegaly appriciated, No rebound -guarding or rigidity. No Cyanosis, Clubbing  No new Rash or bruise   PERTINENT RADIOLOGIC STUDIES: Dg Chest 2 View  Result Date: 11/15/2017 CLINICAL DATA:  LOWER extremity edema. EXAM: CHEST  2 VIEW COMPARISON:  11/15/2016 and prior radiographs FINDINGS: Cardiomediastinal silhouette is unremarkable. LEFT basilar pleuroparenchymal scarring again noted. There is no evidence of focal airspace disease, pulmonary edema, suspicious pulmonary nodule/mass, pleural effusion, or pneumothorax. No acute bony abnormalities are identified. IMPRESSION: No evidence of acute cardiopulmonary disease. Electronically Signed   By: Margarette Canada M.D.   On: 11/15/2017 13:34   US Venous Img Lower Bilateral  Result Date: 11/15/2017 CLINICAL DATA:  Bilateral lower extremity edema for the past week. Former smoker. History of metastatic renal cell carcinoma. Evaluate for DVT. EXAM: BILATERAL LOWER EXTREMITY VENOUS DOPPLER ULTRASOUND TECHNIQUE: Gray-scale sonography with graded compression, as well as color Doppler and duplex ultrasound were performed to evaluate the lower extremity deep venous systems from the level of the common femoral vein and including the common femoral, femoral, profunda femoral, popliteal and calf veins including the posterior tibial, peroneal and gastrocnemius veins when visible. The superficial great saphenous vein was also interrogated.  Spectral Doppler was utilized to evaluate flow at rest and with distal augmentation maneuvers in the common femoral, femoral and popliteal veins. COMPARISON:  None. FINDINGS: RIGHT LOWER EXTREMITY There is mixed echogenic occlusive DVT within the right common femoral vein (image 4), extending to the proximal (image 13), mid (image 17) and distal  (image 21) aspects of the right femoral vein and the right popliteal vein (image 25). The right deep femoral (image 12) and tibial veins vein appears patent where imaged. The right saphenofemoral junction and greater saphenous veins appear patent where imaged. Other Findings:  None. LEFT LOWER EXTREMITY There is hypoechoic nonocclusive thrombus within the left common femoral vein. There is hypoechoic occlusive DVT seen throughout the interrogated course of the left deep femoral vein (image 44), proximal (image 46), mid (image 51) and distal (image 55) aspects of the femoral vein, extending to involve the left popliteal vein (image 58) and imaged portions of the tibial veins. The left saphenofemoral junction and greater saphenous vein appear patent where imaged. Other Findings:  None. IMPRESSION: Examination is positive for extensive bilateral largely occlusive DVT as detailed above. Electronically Signed   By: Sandi Mariscal M.D.   On: 11/15/2017 14:21   Ct Angio Abd/pel W/ And/or W/o  Result Date: 11/15/2017 CLINICAL DATA:  Portal vein thrombosis. EXAM: CTA ABDOMEN AND PELVIS wITHOUT AND WITH CONTRAST TECHNIQUE: Multidetector CT imaging of the abdomen and pelvis was performed using the standard protocol during bolus administration of intravenous contrast. Multiplanar reconstructed images and MIPs were obtained and reviewed to evaluate the vascular anatomy. CONTRAST:  164mL ISOVUE-300 IOPAMIDOL (ISOVUE-300) INJECTION 61% COMPARISON:  CT scan of July 30, 2017. FINDINGS: VASCULAR Aorta: Normal caliber aorta without aneurysm, dissection, vasculitis or significant stenosis. Celiac: Patent without evidence of aneurysm, dissection, vasculitis or significant stenosis. SMA: Patent without evidence of aneurysm, dissection, vasculitis or significant stenosis. Renals: Left renal artery appears normal. Status post right nephrectomy. IMA: Patent without evidence of aneurysm, dissection, vasculitis or significant stenosis.  Inflow: Patent without evidence of aneurysm, dissection, vasculitis or significant stenosis. Proximal Outflow: Bilateral common femoral and visualized portions of the superficial and profunda femoral arteries are patent without evidence of aneurysm, dissection, vasculitis or significant stenosis. Veins: Filling defects are noted in both superficial femoral veins, as well as right common femoral vein, concerning for deep venous thrombosis. Doppler study is recommended for further evaluation. There is no definite evidence of thrombus seen in the IVC. Tumor from left renal mass is seen extending into the left renal vein. The splenic, superior mesenteric and portal veins are widely patent. Review of the MIP images confirms the above findings. NON-VASCULAR Lower chest: No acute abnormality. Hepatobiliary: No focal liver abnormality is seen. No gallstones, gallbladder wall thickening, or biliary dilatation. Pancreas: Stable 3.4 cm rounded abnormality seen in pancreatic body concerning for possible malignancy. No ductal dilatation is noted. Spleen: Normal in size without focal abnormality. Adrenals/Urinary Tract: Status post right nephrectomy. Adrenal glands are unremarkable. 5.3 x 4.3 cm mass is seen involving upper pole of left kidney, with extension of tumor into the left renal vein as previously noted. No hydronephrosis or renal obstruction is noted. Stomach/Bowel: Stomach is within normal limits. Appendix appears normal. No evidence of bowel wall thickening, distention, or inflammatory changes. Lymphatic: No significant adenopathy is noted. Reproductive: Prostate is unremarkable. Other: No abdominal wall hernia or abnormality. No abdominopelvic ascites. Musculoskeletal: No acute or significant osseous findings. IMPRESSION: VASCULAR Thrombosis of both superficial femoral and right common femoral veins is noted consistent  with deep venous thrombosis. The IVC, portal, superior mesenteric and splenic veins are widely  patent. NON-VASCULAR 5.3 x 4.3 cm left renal mass is noted with extension of tumor into the left renal vein. Electronically Signed   By: Marijo Conception, M.D.   On: 11/15/2017 21:11     PERTINENT LAB RESULTS: CBC: Recent Labs    11/15/17 1330 11/16/17 0219  WBC 3.4* 3.1*  HGB 9.6* 8.7*  HCT 30.8* 28.1*  PLT 127* 112*   CMET CMP     Component Value Date/Time   NA 141 11/16/2017 0219   NA 139 07/30/2017 1004   K 3.8 11/16/2017 0219   K 4.2 07/30/2017 1004   CL 105 11/16/2017 0219   CL 104 03/05/2013 1410   CO2 28 11/16/2017 0219   CO2 28 07/30/2017 1004   GLUCOSE 103 (H) 11/16/2017 0219   GLUCOSE 116 07/30/2017 1004   GLUCOSE 119 (H) 03/05/2013 1410   BUN 8 11/16/2017 0219   BUN 21.7 07/30/2017 1004   CREATININE 0.99 11/16/2017 0219   CREATININE 1.2 07/30/2017 1004   CALCIUM 8.0 (L) 11/16/2017 0219   CALCIUM 8.0 (L) 07/30/2017 1004   PROT 5.9 (L) 11/15/2017 1330   PROT 6.9 07/30/2017 1004   ALBUMIN 2.7 (L) 11/15/2017 1330   ALBUMIN 3.1 (L) 07/30/2017 1004   AST 25 11/15/2017 1330   AST 44 (H) 07/30/2017 1004   ALT 25 11/15/2017 1330   ALT 36 07/30/2017 1004   ALKPHOS 71 11/15/2017 1330   ALKPHOS 73 07/30/2017 1004   BILITOT 0.5 11/15/2017 1330   BILITOT 0.45 07/30/2017 1004   GFRNONAA >60 11/16/2017 0219   GFRAA >60 11/16/2017 0219    GFR Estimated Creatinine Clearance: 78.2 mL/min (by C-G formula based on SCr of 0.99 mg/dL). No results for input(s): LIPASE, AMYLASE in the last 72 hours. No results for input(s): CKTOTAL, CKMB, CKMBINDEX, TROPONINI in the last 72 hours. Invalid input(s): POCBNP No results for input(s): DDIMER in the last 72 hours. No results for input(s): HGBA1C in the last 72 hours. No results for input(s): CHOL, HDL, LDLCALC, TRIG, CHOLHDL, LDLDIRECT in the last 72 hours. No results for input(s): TSH, T4TOTAL, T3FREE, THYROIDAB in the last 72 hours.  Invalid input(s): FREET3 No results for input(s): VITAMINB12, FOLATE, FERRITIN, TIBC,  IRON, RETICCTPCT in the last 72 hours. Coags: Recent Labs    11/15/17 1816  INR 1.03   Microbiology: No results found for this or any previous visit (from the past 240 hour(s)).  FURTHER DISCHARGE INSTRUCTIONS:  Get Medicines reviewed and adjusted: Please take all your medications with you for your next visit with your Primary MD  Laboratory/radiological data: Please request your Primary MD to go over all hospital tests and procedure/radiological results at the follow up, please ask your Primary MD to get all Hospital records sent to his/her office.  In some cases, they will be blood work, cultures and biopsy results pending at the time of your discharge. Please request that your primary care M.D. goes through all the records of your hospital data and follows up on these results.  Also Note the following: If you experience worsening of your admission symptoms, develop shortness of breath, life threatening emergency, suicidal or homicidal thoughts you must seek medical attention immediately by calling 911 or calling your MD immediately  if symptoms less severe.  You must read complete instructions/literature along with all the possible adverse reactions/side effects for all the Medicines you take and that have been prescribed to  you. Take any new Medicines after you have completely understood and accpet all the possible adverse reactions/side effects.   Do not drive when taking Pain medications or sleeping medications (Benzodaizepines)  Do not take more than prescribed Pain, Sleep and Anxiety Medications. It is not advisable to combine anxiety,sleep and pain medications without talking with your primary care practitioner  Special Instructions: If you have smoked or chewed Tobacco  in the last 2 yrs please stop smoking, stop any regular Alcohol  and or any Recreational drug use.  Wear Seat belts while driving.  Please note: You were cared for by a hospitalist during your hospital  stay. Once you are discharged, your primary care physician will handle any further medical issues. Please note that NO REFILLS for any discharge medications will be authorized once you are discharged, as it is imperative that you return to your primary care physician (or establish a relationship with a primary care physician if you do not have one) for your post hospital discharge needs so that they can reassess your need for medications and monitor your lab values.  Total Time spent coordinating discharge including counseling, education and face to face time equals 32 minutes.  SignedOren Binet 11/17/2017 12:18 PM

## 2017-11-17 NOTE — Care Management (Signed)
CM consult for assistance with cost of Lovenox.  [facility] pharmacist contacted and states that Lovenox will be $48.62/month.  It is not in stock today but can be delivered at 1-2pm tomorrow.  Pt will receive 1pm dose here today and is aware he must pick up tomorrow after 1pm.  Emphasized to patient and family not to miss any doses and to carefully plan ahead to make sure he does not miss doses.  Wife will check with insurance company tomorrow about long-term cost of Lovenox as there may be a donut-hole issue according to pharmacist.  They will discuss further with oncologist when they know what long-term cost will be.

## 2017-11-17 NOTE — Progress Notes (Signed)
Pt given discharge instructions, prescriptions, and care notes. Pt verbalized understanding AEB no further questions or concerns at this time. IV was discontinued, no redness, pain, or swelling noted at this time. Telemetry discontinued and Centralized Telemetry was notified. Pt left the floor via wheelchair with staff in stable condition. 

## 2017-11-18 ENCOUNTER — Telehealth: Payer: Self-pay | Admitting: Oncology

## 2017-11-18 ENCOUNTER — Telehealth: Payer: Self-pay

## 2017-11-18 NOTE — Telephone Encounter (Signed)
Spoke with patient concerning upcoming appointment already schedule for the 22nd. Per 3/4 phone message. Patient wife stated that  He was recently released from [facility] with blood clots, and desire a sooner appointment because he is takeing blood thinners. Alen Blew is won a half day and will wait for a in basket respond.

## 2017-11-18 NOTE — Telephone Encounter (Signed)
Tried to call patient.

## 2017-11-22 ENCOUNTER — Emergency Department (HOSPITAL_COMMUNITY): Payer: Medicare Other

## 2017-11-22 ENCOUNTER — Inpatient Hospital Stay (HOSPITAL_COMMUNITY)
Admission: EM | Admit: 2017-11-22 | Discharge: 2017-11-26 | DRG: 439 | Disposition: A | Discharge status: Home / Self Care | Payer: Medicare Other | Attending: Internal Medicine | Admitting: Internal Medicine

## 2017-11-22 ENCOUNTER — Other Ambulatory Visit: Payer: Self-pay

## 2017-11-22 ENCOUNTER — Encounter (HOSPITAL_COMMUNITY): Payer: Self-pay | Admitting: Emergency Medicine

## 2017-11-22 DIAGNOSIS — M109 Gout, unspecified: Secondary | ICD-10-CM | POA: Diagnosis present

## 2017-11-22 DIAGNOSIS — I82403 Acute embolism and thrombosis of unspecified deep veins of lower extremity, bilateral: Secondary | ICD-10-CM | POA: Diagnosis present

## 2017-11-22 DIAGNOSIS — Z720 Tobacco use: Secondary | ICD-10-CM | POA: Diagnosis not present

## 2017-11-22 DIAGNOSIS — K8500 Idiopathic acute pancreatitis without necrosis or infection: Secondary | ICD-10-CM | POA: Diagnosis not present

## 2017-11-22 DIAGNOSIS — C649 Malignant neoplasm of unspecified kidney, except renal pelvis: Secondary | ICD-10-CM | POA: Diagnosis present

## 2017-11-22 DIAGNOSIS — G8929 Other chronic pain: Secondary | ICD-10-CM | POA: Diagnosis present

## 2017-11-22 DIAGNOSIS — D61818 Other pancytopenia: Secondary | ICD-10-CM | POA: Diagnosis present

## 2017-11-22 DIAGNOSIS — C642 Malignant neoplasm of left kidney, except renal pelvis: Secondary | ICD-10-CM | POA: Diagnosis not present

## 2017-11-22 DIAGNOSIS — K859 Acute pancreatitis without necrosis or infection, unspecified: Secondary | ICD-10-CM | POA: Diagnosis present

## 2017-11-22 DIAGNOSIS — Z905 Acquired absence of kidney: Secondary | ICD-10-CM

## 2017-11-22 DIAGNOSIS — I82423 Acute embolism and thrombosis of iliac vein, bilateral: Secondary | ICD-10-CM

## 2017-11-22 DIAGNOSIS — K219 Gastro-esophageal reflux disease without esophagitis: Secondary | ICD-10-CM | POA: Diagnosis present

## 2017-11-22 DIAGNOSIS — C7889 Secondary malignant neoplasm of other digestive organs: Secondary | ICD-10-CM | POA: Diagnosis present

## 2017-11-22 DIAGNOSIS — N183 Chronic kidney disease, stage 3 (moderate): Secondary | ICD-10-CM | POA: Diagnosis present

## 2017-11-22 DIAGNOSIS — Z79899 Other long term (current) drug therapy: Secondary | ICD-10-CM | POA: Diagnosis not present

## 2017-11-22 DIAGNOSIS — R1033 Periumbilical pain: Secondary | ICD-10-CM

## 2017-11-22 DIAGNOSIS — K869 Disease of pancreas, unspecified: Secondary | ICD-10-CM | POA: Diagnosis not present

## 2017-11-22 DIAGNOSIS — K8689 Other specified diseases of pancreas: Secondary | ICD-10-CM | POA: Diagnosis present

## 2017-11-22 DIAGNOSIS — K8501 Idiopathic acute pancreatitis with uninfected necrosis: Secondary | ICD-10-CM | POA: Diagnosis not present

## 2017-11-22 LAB — URINALYSIS, ROUTINE W REFLEX MICROSCOPIC
Bilirubin Urine: NEGATIVE
GLUCOSE, UA: NEGATIVE mg/dL
HGB URINE DIPSTICK: NEGATIVE
KETONES UR: NEGATIVE mg/dL
Leukocytes, UA: NEGATIVE
Nitrite: NEGATIVE
PROTEIN: NEGATIVE mg/dL
Specific Gravity, Urine: 1.028 (ref 1.005–1.030)
pH: 8 (ref 5.0–8.0)

## 2017-11-22 LAB — COMPREHENSIVE METABOLIC PANEL
ALK PHOS: 83 U/L (ref 38–126)
ALT: 18 U/L (ref 17–63)
AST: 20 U/L (ref 15–41)
Albumin: 2.8 g/dL — ABNORMAL LOW (ref 3.5–5.0)
Anion gap: 13 (ref 5–15)
BUN: 13 mg/dL (ref 6–20)
CALCIUM: 9.3 mg/dL (ref 8.9–10.3)
CO2: 24 mmol/L (ref 22–32)
Chloride: 104 mmol/L (ref 101–111)
Creatinine, Ser: 1.25 mg/dL — ABNORMAL HIGH (ref 0.61–1.24)
GFR, EST NON AFRICAN AMERICAN: 58 mL/min — AB (ref >60)
Glucose, Bld: 123 mg/dL — ABNORMAL HIGH (ref 65–99)
Potassium: 3.4 mmol/L — ABNORMAL LOW (ref 3.5–5.1)
Sodium: 141 mmol/L (ref 135–145)
Total Bilirubin: 0.8 mg/dL (ref 0.3–1.2)
Total Protein: 6.3 g/dL — ABNORMAL LOW (ref 6.5–8.1)

## 2017-11-22 LAB — CBC WITH DIFFERENTIAL/PLATELET
BASOS PCT: 0 %
Basophils Absolute: 0 10*3/uL (ref 0.0–0.1)
EOS ABS: 0 10*3/uL (ref 0.0–0.7)
EOS PCT: 1 %
HCT: 31.1 % — ABNORMAL LOW (ref 39.0–52.0)
HEMOGLOBIN: 9.7 g/dL — AB (ref 13.0–17.0)
LYMPHS ABS: 0.4 10*3/uL — AB (ref 0.7–4.0)
Lymphocytes Relative: 14 %
MCH: 30.7 pg (ref 26.0–34.0)
MCHC: 31.2 g/dL (ref 30.0–36.0)
MCV: 98.4 fL (ref 78.0–100.0)
MONOS PCT: 6 %
Monocytes Absolute: 0.2 10*3/uL (ref 0.1–1.0)
NEUTROS PCT: 79 %
Neutro Abs: 2.4 10*3/uL (ref 1.7–7.7)
PLATELETS: 111 10*3/uL — AB (ref 150–400)
RBC: 3.16 MIL/uL — AB (ref 4.22–5.81)
RDW: 18.3 % — ABNORMAL HIGH (ref 11.5–15.5)
WBC: 3.1 10*3/uL — AB (ref 4.0–10.5)

## 2017-11-22 LAB — LIPASE, BLOOD: Lipase: 835 U/L — ABNORMAL HIGH (ref 11–51)

## 2017-11-22 MED ORDER — IOPAMIDOL (ISOVUE-300) INJECTION 61%
100.0000 mL | Freq: Once | INTRAVENOUS | Status: AC | PRN
  Administered 2017-11-22: 100 mL via INTRAVENOUS

## 2017-11-22 MED ORDER — MORPHINE SULFATE (PF) 2 MG/ML IV SOLN
2.0000 mg | INTRAVENOUS | Status: DC | PRN
  Administered 2017-11-22 (×4): 2 mg via INTRAVENOUS
  Filled 2017-11-22 (×4): qty 1

## 2017-11-22 MED ORDER — HYDROCODONE-ACETAMINOPHEN 5-325 MG PO TABS
1.0000 | ORAL_TABLET | Freq: Once | ORAL | Status: AC
  Administered 2017-11-22: 1 via ORAL
  Filled 2017-11-22: qty 1

## 2017-11-22 MED ORDER — PROMETHAZINE HCL 25 MG/ML IJ SOLN
12.5000 mg | Freq: Four times a day (QID) | INTRAMUSCULAR | Status: DC | PRN
  Administered 2017-11-22: 12.5 mg via INTRAVENOUS
  Filled 2017-11-22: qty 1

## 2017-11-22 MED ORDER — SODIUM CHLORIDE 0.9 % IV BOLUS (SEPSIS)
1000.0000 mL | Freq: Once | INTRAVENOUS | Status: AC
  Administered 2017-11-22: 1000 mL via INTRAVENOUS

## 2017-11-22 MED ORDER — ENOXAPARIN SODIUM 120 MG/0.8ML ~~LOC~~ SOLN
120.0000 mg | SUBCUTANEOUS | Status: DC
  Administered 2017-11-22 – 2017-11-25 (×4): 120 mg via SUBCUTANEOUS
  Filled 2017-11-22 (×4): qty 0.8

## 2017-11-22 MED ORDER — MORPHINE SULFATE (PF) 4 MG/ML IV SOLN
4.0000 mg | Freq: Once | INTRAVENOUS | Status: AC
  Administered 2017-11-22: 4 mg via INTRAVENOUS
  Filled 2017-11-22: qty 1

## 2017-11-22 MED ORDER — FENTANYL CITRATE (PF) 100 MCG/2ML IJ SOLN
50.0000 ug | Freq: Once | INTRAMUSCULAR | Status: AC
  Administered 2017-11-22: 50 ug via INTRAVENOUS
  Filled 2017-11-22: qty 2

## 2017-11-22 MED ORDER — DIPHENHYDRAMINE HCL 50 MG/ML IJ SOLN
25.0000 mg | Freq: Once | INTRAMUSCULAR | Status: AC
  Administered 2017-11-22: 25 mg via INTRAVENOUS
  Filled 2017-11-22: qty 1

## 2017-11-22 MED ORDER — METOCLOPRAMIDE HCL 5 MG/ML IJ SOLN
10.0000 mg | Freq: Once | INTRAMUSCULAR | Status: AC
  Administered 2017-11-22: 10 mg via INTRAVENOUS
  Filled 2017-11-22: qty 2

## 2017-11-22 MED ORDER — HYDROMORPHONE HCL 1 MG/ML IJ SOLN
0.5000 mg | Freq: Once | INTRAMUSCULAR | Status: AC
  Administered 2017-11-22: 0.5 mg via INTRAVENOUS
  Filled 2017-11-22: qty 0.5

## 2017-11-22 MED ORDER — SENNOSIDES-DOCUSATE SODIUM 8.6-50 MG PO TABS: 1.0000 | ORAL_TABLET | Freq: Every evening | ORAL | Status: DC | PRN

## 2017-11-22 MED ORDER — SODIUM CHLORIDE 0.9 % IV SOLN
INTRAVENOUS | Status: DC
  Administered 2017-11-22 – 2017-11-26 (×9): via INTRAVENOUS

## 2017-11-22 NOTE — ED Triage Notes (Signed)
Pt C/O abdominal pain that started at 2100 last night. 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 Pt states increased pain since his Lovenox injection that was given at 2100 last night.

## 2017-11-22 NOTE — ED Provider Notes (Signed)
[facility] EMERGENCY DEPARTMENT Provider Note   CSN: [id number] Arrival date & time: 11/22/17  0355  Time seen 05:00 AM   History   Chief Complaint Chief Complaint  Patient presents with  . Abdominal Pain    HPI Jeffrey Trujillo is a 67 y.o. male.  HPI patient has a history of renal cell carcinoma status post nephrectomy who was diagnosed with DVT of both lower extremities on March 1.  He has been doing Lovenox injections since that time.  They report tonight about 9 PM he had acute onset of periumbilical abdominal pain with nausea and vomiting somewhere around 15 times without blood.  He denies any fever.  He denies chest pain or shortness of breath.  He states he is never had this pain before.  He describes it as aching and constant.  He states the pain does not radiate into his back.  PCP [facility] Oncology Dr Alen Blew  Past Medical History:  Diagnosis Date  . Anal fistula   . Anemia in chronic renal disease   . Barrett's esophagus    by EGD 11/02/2009  . Chronic pain   . CKD (chronic kidney disease), stage III ([place])   . Diverticulosis of colon    by colonoscopy 11/02/2009  . DJD (degenerative joint disease) of cervical spine   . GERD (gastroesophageal reflux disease)   . Gouty arthritis    per pt on 05-16-2016 stable (last bout bilatearl wrist 03/ 2017)  . History of diverticulitis of colon    w/ small perforation , no surgical intervention 06/ 2014  . History of peptic ulcer disease   . Iron deficiency anemia due to chronic blood loss   . 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 Nocturia   . Thrombocytopenia ([place])    secondary to oral chemo  . Wears glasses      Patient Active Problem List   Diagnosis Date Noted  . DVT, bilateral lower limbs ([place]) 11/15/2017  . History of renal cell cancer   . Chronic neck pain   . Goals of care, counseling/discussion   . Palliative care encounter   . Encephalopathy acute 11/15/2016  . Fever 12/07/2015  . Gouty arthritis 12/07/2015  . Fall   . Pyrexia   . Acute gout 11/15/2015  . Perirectal abscess 11/12/2015  . Hypokalemia 11/12/2015  . Anemia of chronic disease 11/12/2015  . Gallstones   . Sinus bradycardia 02/14/2015  . Malnutrition of moderate degree ([place]) 02/12/2015  . Elevated LFTs 02/12/2015  . Abdominal pain, right upper quadrant 02/12/2015  . Acute kidney injury ([place]) 02/12/2015  . Cholecystitis 02/11/2015  . Hiatal hernia   . Chest pain 04/15/2014  . PUD (peptic ulcer disease) 04/21/2013  . Melena 03/10/2013  . Abdominal pain 03/10/2013  . Leukopenia 03/10/2013  . Thrombocytopenia ([place]) 03/10/2013  . Abdominal abscess 03/10/2013  . Anemia 03/10/2013  . ETOH abuse 03/10/2013  . Bradycardia 03/10/2013  . Barrett's esophagus 01/04/2013  . Renal cell cancer ([place]) 09/25/2012  . Lower GI bleed 09/25/2012  . Anemia due to blood loss 09/25/2012  . Acute gout of right foot 09/25/2012  . DJD (degenerative joint disease) 09/25/2012  .  Pancreatic mass 08/04/2012  . Acute strain of neck muscle 06/05/2011  . RECTAL BLEEDING 10/20/2009  . NAUSEA WITH VOMITING 10/20/2009  . DIARRHEA, BLOODY 10/20/2009    Past Surgical History:  Procedure Laterality Date  . COLONOSCOPY  Feb 2011   [name]: single anal papilla, pan-colonic diverticula  . COLONOSCOPY  09/25/2012   RMR: Colonic diverticulosis. Suspect diverticular bleeding-hepatic flexure tic-sealed  . ESOPHAGOGASTRODUODENOSCOPY  Feb 2011   [name]: salmon-colored epithelium consistent with Barrett's, nodular antral erosions, small hiatal hernia, negative H.pylori  . ESOPHAGOGASTRODUODENOSCOPY  09/25/2012   ACZ:YSAYTKZ'S esophagus. Small  hiatal hernia. Antral erosions of doubtful clinical significance, no biopsies  . ESOPHAGOGASTRODUODENOSCOPY N/A 03/11/2013   SLF: Barrett's esophagus/Single ulcer in the gastric antrum-PROBABLE SOURCE FOR BLACK STOOL/ Chronic gastritis in the gastric antrum  . ESOPHAGOGASTRODUODENOSCOPY N/A 10/11/2014   Dr. [name] barrett's s/p biopsy. Hiatal hernia. Biopsy with intestinal metaplasia, glandular atypia indefinite for low grade dysplasia noted as well  . ESOPHAGOGASTRODUODENOSCOPY N/A 01/19/2015   [name]: Barrett's no dysplasia  . EUS  08/07/2012   Procedure: UPPER ENDOSCOPIC ULTRASOUND (EUS) LINEAR;  Surgeon: [name], MD;  Location: [facility] ENDOSCOPY;  Service: Endoscopy;  Laterality: N/A;  . EUS  08/21/2012   Procedure: UPPER ENDOSCOPIC ULTRASOUND (EUS) LINEAR;  Surgeon: [name], MD;  Location: [facility] ENDOSCOPY;  Service: Endoscopy;  Laterality: N/A;  . INCISION AND DRAINAGE ABSCESS N/A 11/13/2015   Procedure: INCISION AND DRAINAGE ABSCESS;  Surgeon: Aviva Signs, MD;  Location: [facility] ORS;  Service: General;  Laterality: N/A;  . INGUINAL HERNIA REPAIR Bilateral 1990's  . LAPAROSCOPIC NEPHRECTOMY Right 06/27/2009   dr Alinda Money  . LUNG REMOVAL, PARTIAL  1970   left lower lobe, benign       Home Medications    Prior to Admission medications   Medication Sig Start Date End Date Taking? Authorizing Provider  allopurinol (ZYLOPRIM) 100 MG tablet Take 0.5 tablets (50 mg total) by mouth 2 (two) times daily. Patient taking differently: Take 100 mg by mouth 2 (two) times daily.  09/29/17   Carmin Muskrat, MD  enoxaparin (LOVENOX) 120 MG/0.8ML injection Inject 0.8 mLs (120 mg total) into the skin daily. 11/17/17   Ghimire, Henreitta Leber, MD  HYDROcodone-acetaminophen (NORCO/VICODIN) 5-325 MG tablet Take 1 tablet by mouth every 6 (six) hours as needed. 11/17/17   Ghimire, Henreitta Leber, MD    Family History Family History  Problem Relation Age of Onset  . Lung cancer Brother   . Prostate  cancer Brother   . Lumbar disc disease Brother   . Prostate cancer Brother   . Colon cancer Neg Hx     Social History Social History   Tobacco Use  . Smoking status: Former Smoker    Packs/day: 0.25    Years: 4.00    Pack years: 1.00    Types: Cigars    Last attempt to quit: 05/16/2014    Years since quitting: 3.5  . Smokeless tobacco: Current User    Types: Chew  Substance Use Topics  . Alcohol use: Yes    Alcohol/week: 2.4 - 3.0 oz    Types: 2 - 3 Cans of beer, 2 Shots of liquor per week    Comment: binges intermittently, 1/5 vodka daily until it makes him sick, last ETOH was about 3 months ago  . Drug use: No  lives with spouse   Allergies   Amoxicillin; Nsaids; Zofran [ondansetron hcl]; and Demerol [meperidine]   Review of Systems Review of Systems  All  other systems reviewed and are negative.    Physical Exam Updated Vital Signs BP (!) 168/78 (BP Location: Right Arm) Comment: Simultaneous filing. User may not have seen previous data.  Pulse 75   Temp 98.4 F (36.9 C) (Oral)   Resp 20   SpO2 100%   Vital signs normal except for hypertension   Physical Exam  Constitutional: He is oriented to person, place, and time. He appears well-developed and well-nourished.  Non-toxic appearance. He does not appear ill. He appears distressed.  Patient can be heard moaning and yelling throughout the ED  HENT:  Head: Normocephalic and atraumatic.  Right Ear: External ear normal.  Left Ear: External ear normal.  Nose: Nose normal. No mucosal edema or rhinorrhea.  Mouth/Throat: Oropharynx is clear and moist and mucous membranes are normal. No dental abscesses or uvula swelling.  Eyes: Conjunctivae and EOM are normal. Pupils are equal, round, and reactive to light.  Neck: Normal range of motion and full passive range of motion without pain. Neck supple.  Cardiovascular: Normal rate, regular rhythm and normal heart sounds. Exam reveals no gallop and no friction rub.  No  murmur heard. Pulmonary/Chest: Effort normal and breath sounds normal. No respiratory distress. He has no wheezes. He has no rhonchi. He has no rales. He exhibits no tenderness and no crepitus.  Abdominal: Soft. Normal appearance and bowel sounds are normal. He exhibits no distension. There is no tenderness. There is no rebound and no guarding.    Although patient complains of pain centered around his umbilicus his abdomen is nontender to palpation  Area of pain noted  Musculoskeletal: Normal range of motion. He exhibits no edema or tenderness.  Moves all extremities well.   Neurological: He is alert and oriented to person, place, and time. He has normal strength. No cranial nerve deficit.  Skin: Skin is warm, dry and intact. No rash noted. No erythema. There is pallor.  Psychiatric: He has a normal mood and affect. His speech is normal and behavior is normal. His mood appears not anxious.  Nursing note and vitals reviewed.  Patient has several small bruised areas on the skin of his lower abdomen consistent with Lovenox injections however they are small, 1/2 cm in size and are not indurated or infected looking.  ED Treatments / Results  Labs (all labs ordered are listed, but only abnormal results are displayed) Results for orders placed or performed during the hospital encounter of 11/22/17  Comprehensive metabolic panel  Result Value Ref Range   Sodium 141 135 - 145 mmol/L   Potassium 3.4 (L) 3.5 - 5.1 mmol/L   Chloride 104 101 - 111 mmol/L   CO2 24 22 - 32 mmol/L   Glucose, Bld 123 (H) 65 - 99 mg/dL   BUN 13 6 - 20 mg/dL   Creatinine, Ser 1.25 (H) 0.61 - 1.24 mg/dL   Calcium 9.3 8.9 - 10.3 mg/dL   Total Protein 6.3 (L) 6.5 - 8.1 g/dL   Albumin 2.8 (L) 3.5 - 5.0 g/dL   AST 20 15 - 41 U/L   ALT 18 17 - 63 U/L   Alkaline Phosphatase 83 38 - 126 U/L   Total Bilirubin 0.8 0.3 - 1.2 mg/dL   GFR calc non Af Amer 58 (L) >60 mL/min   GFR calc Af Amer >60 >60 mL/min   Anion gap 13 5 -  15  CBC with Differential  Result Value Ref Range   WBC 3.1 (L) 4.0 - 10.5 K/uL  RBC 3.16 (L) 4.22 - 5.81 MIL/uL   Hemoglobin 9.7 (L) 13.0 - 17.0 g/dL   HCT 31.1 (L) 39.0 - 52.0 %   MCV 98.4 78.0 - 100.0 fL   MCH 30.7 26.0 - 34.0 pg   MCHC 31.2 30.0 - 36.0 g/dL   RDW 18.3 (H) 11.5 - 15.5 %   Platelets 111 (L) 150 - 400 K/uL   Neutrophils Relative % 79 %   Neutro Abs 2.4 1.7 - 7.7 K/uL   Lymphocytes Relative 14 %   Lymphs Abs 0.4 (L) 0.7 - 4.0 K/uL   Monocytes Relative 6 %   Monocytes Absolute 0.2 0.1 - 1.0 K/uL   Eosinophils Relative 1 %   Eosinophils Absolute 0.0 0.0 - 0.7 K/uL   Basophils Relative 0 %   Basophils Absolute 0.0 0.0 - 0.1 K/uL  Lipase, blood  Result Value Ref Range   Lipase 835 (H) 11 - 51 U/L  Urinalysis, Routine w reflex microscopic  Result Value Ref Range   Color, Urine YELLOW YELLOW   APPearance CLEAR CLEAR   Specific Gravity, Urine 1.028 1.005 - 1.030   pH 8.0 5.0 - 8.0   Glucose, UA NEGATIVE NEGATIVE mg/dL   Hgb urine dipstick NEGATIVE NEGATIVE   Bilirubin Urine NEGATIVE NEGATIVE   Ketones, ur NEGATIVE NEGATIVE mg/dL   Protein, ur NEGATIVE NEGATIVE mg/dL   Nitrite NEGATIVE NEGATIVE   Leukocytes, UA NEGATIVE NEGATIVE    Laboratory interpretation all normal except very elevated lipase, renal insufficiency, stable anemia    EKG  EKG Interpretation None       Radiology Ct Abdomen Pelvis W Contrast  Result Date: 11/22/2017 CLINICAL DATA:  Acute onset of periumbilical abdominal pain tonight. Patient on Lovenox for DVT. Renal cell carcinoma. EXAM: CT ABDOMEN AND PELVIS WITH CONTRAST TECHNIQUE: Multidetector CT imaging of the abdomen and pelvis was performed using the standard protocol following bolus administration of intravenous contrast. CONTRAST:  148mL ISOVUE-300 IOPAMIDOL (ISOVUE-300) INJECTION 61% COMPARISON:  CT angiography 11/15/2017 FINDINGS: Lower chest: Left pleural thickening.  No frank effusion. Hepatobiliary: 7 mm low-density lesion  in the central dome of the liver, unchanged. No new hepatic lesion. Gallbladder physiologically distended, no calcified stone. No biliary dilatation. Pancreas: Low-density lesion in the mid pancreas measures 3.9 x 3.6 cm. Pancreatic head lesion measures 13 x 17 mm. No ductal dilatation or peripancreatic stranding. Spleen: Prominent size spanning 13.9 cm. Adrenals/Urinary Tract: No dominant adrenal nodule. Again seen heterogeneous enhancing left renal lesion measuring approximately 5.3 x 4 cm extending into the left renal vein. No evidence of hemorrhage. No hydronephrosis. Simple cyst arises from the upper kidney measuring 3.7 cm. Post right nephrectomy. Urinary bladder is physiologically distended. Stomach/Bowel: Small hiatal hernia. Stomach physiologically distended. No bowel wall thickening, inflammatory change or obstruction. Normal appendix. Distal colonic diverticulosis without diverticulitis. Perianal air in the right tracking into the perineum is unchanged from the November 2018 CT. Vascular/Lymphatic: Filling defects within right common femoral and bilateral superficial femoral veins consistent with known DVT. No interval progression. No new vascular finding. Normal caliber abdominal aorta. Reproductive: Prostatic calcifications. Other: No ascites or free air.  No intra-abdominal abscess. Musculoskeletal: There are no acute or suspicious osseous abnormalities. IMPRESSION: 1. No acute abnormality. 2. Unchanged left renal mass with invasion of the left renal vein. Unchanged pancreatic body and pancreatic head lesions, potentially metastasis. 3. DVT in the right common femoral bilateral superficial femoral veins, without significant progression from recent CT. 4. Mild colonic diverticulosis without diverticulitis. Mild splenomegaly. Electronically Signed  By: Jeb Levering M.D.   On: 11/22/2017 06:09     US Venous Img Lower Bilateral  Result Date: 11/15/2017 CLINICAL DATA:  Bilateral lower extremity  edema for the past week. Former smoker. History of metastatic renal cell carcinoma. Evaluate for DVT. Marland Kitchen IMPRESSION: Examination is positive for extensive bilateral largely occlusive DVT as detailed above. Electronically Signed   By: Sandi Mariscal M.D.   On: 11/15/2017 14:21   Ct Angio Abd/pel W/ And/or W/o  Result Date: 11/15/2017 CLINICAL DATA:  Portal vein thrombosis. . IMPRESSION: VASCULAR Thrombosis of both superficial femoral and right common femoral veins is noted consistent with deep venous thrombosis. The IVC, portal, superior mesenteric and splenic veins are widely patent. NON-VASCULAR 5.3 x 4.3 cm left renal mass is noted with extension of tumor into the left renal vein. Electronically Signed   By: Marijo Conception, M.D.   On: 11/15/2017 21:11    Procedures Procedures (including critical care time)  Medications Ordered in ED Medications  sodium chloride 0.9 % bolus 1,000 mL (1,000 mLs Intravenous New Bag/Given 11/22/17 0522)  fentaNYL (SUBLIMAZE) injection 50 mcg (50 mcg Intravenous Given 11/22/17 0522)  metoCLOPramide (REGLAN) injection 10 mg (10 mg Intravenous Given 11/22/17 0522)  diphenhydrAMINE (BENADRYL) injection 25 mg (25 mg Intravenous Given 11/22/17 0522)  iopamidol (ISOVUE-300) 61 % injection 100 mL (100 mLs Intravenous Contrast Given 11/22/17 0539)  HYDROcodone-acetaminophen (NORCO/VICODIN) 5-325 MG per tablet 1 tablet (1 tablet Oral Given 11/22/17 0647)  morphine 4 MG/ML injection 4 mg (4 mg Intravenous Given 11/22/17 0716)     Initial Impression / Assessment and Plan / ED Course  I have reviewed the triage vital signs and the nursing notes.  Pertinent labs & imaging results that were available during my care of the patient were reviewed by me and considered in my medical decision making (see chart for details).      Patient was given IV fentanyl, CT was done to look for any acute intra-abdominal process to account for his pain.  Concern is that he now has retroperitoneal bleeding  from his Lovenox injections.  When I first looked at his CT scan is said no acute changes to explain his symptoms.  However when I saw his lipase was very elevated I went back and look specifically at the pancreas.  There is now a pancreatic mass seen without evidence of inflammation around it however his lipase is significantly elevated.  Patient will probably need to be admitted for pain control and further evaluation.  It is not clear from this study if this is a primary pancreatic problem or a metastatic lesion from his renal cell carcinoma.  7:07 AM Dr. Jerilee Hoh, hospitalist will admit.  Patient was given his test results.  He is now calm and lying quietly on the stretcher.   Review of the [place] shows patient got for controlled substance prescriptions in 2018 and only 2 prescriptions in 2019.  He got 15 hydrocodone 5/325 on January 13 and 6 oxycodone 5/325 on February 12.  Mostly his prescriptions have been written from the emergency department.  Final Clinical Impressions(s) / ED Diagnoses   Final diagnoses:  Pancreatic mass  Acute pancreatitis, unspecified complication status, unspecified pancreatitis type  Periumbilical abdominal pain    Plan admission  Rolland Porter, MD, Barbette Or, MD 11/22/17 [phone]

## 2017-11-22 NOTE — H&P (Signed)
History and Physical    Jeffrey Trujillo:[id number] DOB: [birth year] DOA: 11/22/2017  Referring MD/NP/PA: Rolland Porter, EDP PCP: [facility]  Patient coming from: Home  Chief Complaint: Abdominal pain, nausea  HPI: Jeffrey Trujillo is a 67 y.o. 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  Was evaluated by vascular surgery who did not believe there was a role for thrombectomy.  He was placed on therapeutic doses of Lovenox.  He returns today due to significant abdominal pain and nausea.  Is found to have a lipase that is elevated above 800 and admission is requested for management of acute pancreatitis and pain control.  Past Medical/Surgical History: Past Medical History:  Diagnosis Date  . Anal fistula   . Anemia in chronic renal disease   . Barrett's esophagus    by EGD 11/02/2009  . Chronic pain   . CKD (chronic kidney disease), stage III ([place])   . Diverticulosis of colon    by colonoscopy 11/02/2009  . DJD (degenerative joint disease) of cervical spine   . GERD (gastroesophageal reflux disease)   . Gouty arthritis    per pt on 05-16-2016 stable (last bout bilatearl wrist 03/ 2017)  . History of diverticulitis of colon    w/ small perforation , no surgical intervention 06/ 2014  . History of peptic ulcer disease   . Iron deficiency anemia due to chronic blood loss   . 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 Nocturia   . 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 Wears glasses     Past Surgical History:  Procedure Laterality Date  . COLONOSCOPY  Feb 2011   [name]: single anal papilla, pan-colonic diverticula  . COLONOSCOPY  09/25/2012   RMR: Colonic diverticulosis. Suspect diverticular bleeding-hepatic flexure tic-sealed  . ESOPHAGOGASTRODUODENOSCOPY  Feb 2011   [name]: salmon-colored epithelium consistent with Barrett's, nodular antral erosions, small hiatal hernia, negative H.pylori  . ESOPHAGOGASTRODUODENOSCOPY  09/25/2012   XHB:ZJIRCVE'L esophagus. Small hiatal hernia. Antral erosions of doubtful clinical significance, no biopsies  . ESOPHAGOGASTRODUODENOSCOPY N/A 03/11/2013   SLF: Barrett's esophagus/Single ulcer in the gastric antrum-PROBABLE SOURCE FOR BLACK STOOL/ Chronic gastritis in the gastric antrum  . ESOPHAGOGASTRODUODENOSCOPY N/A 10/11/2014   Dr. [name] barrett's s/p biopsy. Hiatal hernia. Biopsy with intestinal metaplasia, glandular atypia indefinite for low grade dysplasia noted as well  . ESOPHAGOGASTRODUODENOSCOPY N/A 01/19/2015   [name]: Barrett's no dysplasia  . EUS  08/07/2012   Procedure: UPPER ENDOSCOPIC ULTRASOUND (EUS) LINEAR;  Surgeon: [name], MD;  Location: [facility];  Service: Endoscopy;  Laterality: N/A;  . EUS  08/21/2012   Procedure: UPPER ENDOSCOPIC ULTRASOUND (EUS) LINEAR;  Surgeon: [name], MD;  Location: [facility];  Service: Endoscopy;  Laterality: N/A;  . INCISION AND DRAINAGE ABSCESS N/A 11/13/2015   Procedure: INCISION AND DRAINAGE ABSCESS;  Surgeon: Aviva Signs, MD;  Location: [facility];  Service: General;  Laterality: N/A;  .  INGUINAL HERNIA REPAIR Bilateral 1990's  . LAPAROSCOPIC NEPHRECTOMY Right 06/27/2009   dr Alinda Money  . LUNG REMOVAL, PARTIAL  1970   left lower lobe, benign    Social History:  reports that he quit smoking about 3 years ago. His smoking use included cigars. He has a 1.00 pack-year smoking history. His smokeless tobacco use includes  chew. He reports that he drinks about 2.4 - 3.0 oz of alcohol per week. He reports that he does not use drugs.  Allergies: Allergies  Allergen Reactions  . Amoxicillin Nausea And Vomiting  . Nsaids Other (See Comments)    Patient only has one kidney.   Marland Kitchen Zofran [Ondansetron Hcl] Hives  . Demerol [Meperidine] Rash    Family History:  Family History  Problem Relation Age of Onset  . Lung cancer Brother   . Prostate cancer Brother   . Lumbar disc disease Brother   . Prostate cancer Brother   . Colon cancer Neg Hx     Prior to Admission medications   Medication Sig Start Date End Date Taking? Authorizing Provider  acetaminophen (TYLENOL) 500 MG tablet Take 500-1,000 mg by mouth every 6 (six) hours as needed for mild pain.   Yes [provider]  allopurinol (ZYLOPRIM) 100 MG tablet Take 0.5 tablets (50 mg total) by mouth 2 (two) times daily. Patient taking differently: Take 100 mg by mouth 2 (two) times daily.  09/29/17  Yes Carmin Muskrat, MD  enoxaparin (LOVENOX) 120 MG/0.8ML injection Inject 0.8 mLs (120 mg total) into the skin daily. 11/17/17  Yes Ghimire, Henreitta Leber, MD  HYDROcodone-acetaminophen (NORCO/VICODIN) 5-325 MG tablet Take 1 tablet by mouth every 6 (six) hours as needed. Patient taking differently: Take 1 tablet by mouth every 4 (four) hours as needed for moderate pain.  11/17/17  Yes Ghimire, Henreitta Leber, MD    Review of Systems:  Constitutional: Denies fever, chills, diaphoresis, appetite change and fatigue.  HEENT: Denies photophobia, eye pain, redness, hearing loss, ear pain, congestion, sore throat, rhinorrhea, sneezing, mouth sores, trouble swallowing, neck pain, neck stiffness and tinnitus.   Respiratory: Denies SOB, DOE, cough, chest tightness,  and wheezing.   Cardiovascular: Denies chest pain, palpitations and leg swelling.  Gastrointestinal: Denies diarrhea, constipation, blood in stool and abdominal distention.  Genitourinary: Denies dysuria, urgency,  frequency, hematuria, flank pain and difficulty urinating.  Endocrine: Denies: hot or cold intolerance, sweats, changes in hair or nails, polyuria, polydipsia. Musculoskeletal: Denies myalgias, back pain, joint swelling, arthralgias and gait problem.  Skin: Denies pallor, rash and wound.  Neurological: Denies dizziness, seizures, syncope, weakness, light-headedness, numbness and headaches.  Hematological: Denies adenopathy. Easy bruising, personal or family bleeding history  Psychiatric/Behavioral: Denies suicidal ideation, mood changes, confusion, nervousness, sleep disturbance and agitation    Physical Exam: Vitals:   11/22/17 0609 11/22/17 0700 11/22/17 0800 11/22/17 0906  BP:  (!) 152/48 (!) 153/77 (!) 161/73  Pulse:  80 80 93  Resp: 20   18  Temp:    99 F (37.2 C)  TempSrc:    Oral  SpO2:  99% 99% 100%  Weight:    81 kg (178 lb 9.2 oz)  Height:    5\' 11"  (1.803 m)     Constitutional: NAD, calm, comfortable Eyes: PERRL, lids and conjunctivae normal ENMT: Mucous membranes are moist. Posterior pharynx clear of any exudate or lesions.Normal dentition.  Neck: normal, supple, no masses, no thyromegaly Respiratory: clear to auscultation bilaterally, no wheezing, no crackles. Normal respiratory effort. No accessory muscle use.  Cardiovascular: Regular rate and rhythm, no murmurs / rubs / gallops. No extremity edema. 2+ pedal pulses. No carotid bruits.  Abdomen: Tender to palpation of the epigastrium and right upper quadrant no masses palpated. No hepatosplenomegaly. Bowel sounds positive.  Musculoskeletal: no clubbing / cyanosis. No joint deformity upper and lower extremities. Good ROM, no contractures. Normal muscle tone.  Skin: no rashes, lesions, ulcers. No induration Neurologic: CN 2-12 grossly intact. Sensation intact, DTR normal. Strength 5/5 in all 4.  Psychiatric: Normal judgment and insight. Alert and oriented x 3. Normal mood.    Labs on Admission: I have personally  reviewed the following labs and imaging studies  CBC: Recent Labs  Lab 11/16/17 0219 11/22/17 0508  WBC 3.1* 3.1*  NEUTROABS  --  2.4  HGB 8.7* 9.7*  HCT 28.1* 31.1*  MCV 100.0 98.4  PLT 112* 761*   Basic Metabolic Panel: Recent Labs  Lab 11/16/17 0219 11/22/17 0508  NA 141 141  K 3.8 3.4*  CL 105 104  CO2 28 24  GLUCOSE 103* 123*  BUN 8 13  CREATININE 0.99 1.25*  CALCIUM 8.0* 9.3   GFR: Estimated Creatinine Clearance: 61.9 mL/min (A) (by C-G formula based on SCr of 1.25 mg/dL (H)). Liver Function Tests: Recent Labs  Lab 11/22/17 0508  AST 20  ALT 18  ALKPHOS 83  BILITOT 0.8  PROT 6.3*  ALBUMIN 2.8*   Recent Labs  Lab 11/22/17 0508  LIPASE 835*   No results for input(s): AMMONIA in the last 168 hours. Coagulation Profile: Recent Labs  Lab 11/15/17 1816  INR 1.03   Cardiac Enzymes: No results for input(s): CKTOTAL, CKMB, CKMBINDEX, TROPONINI in the last 168 hours. BNP (last 3 results) No results for input(s): PROBNP in the last 8760 hours. HbA1C: No results for input(s): HGBA1C in the last 72 hours. CBG: No results for input(s): GLUCAP in the last 168 hours. Lipid Profile: No results for input(s): CHOL, HDL, LDLCALC, TRIG, CHOLHDL, LDLDIRECT in the last 72 hours. Thyroid Function Tests: No results for input(s): TSH, T4TOTAL, FREET4, T3FREE, THYROIDAB in the last 72 hours. Anemia Panel: No results for input(s): VITAMINB12, FOLATE, FERRITIN, TIBC, IRON, RETICCTPCT in the last 72 hours. Urine analysis:    Component Value Date/Time   COLORURINE YELLOW 11/22/2017 0627   APPEARANCEUR CLEAR 11/22/2017 0627   LABSPEC 1.028 11/22/2017 0627   PHURINE 8.0 11/22/2017 0627   GLUCOSEU NEGATIVE 11/22/2017 0627   HGBUR NEGATIVE 11/22/2017 0627   BILIRUBINUR NEGATIVE 11/22/2017 0627   KETONESUR NEGATIVE 11/22/2017 0627   PROTEINUR NEGATIVE 11/22/2017 0627   UROBILINOGEN 4.0 (H) 02/11/2015 1409   NITRITE NEGATIVE 11/22/2017 0627   LEUKOCYTESUR NEGATIVE  11/22/2017 0627   Sepsis Labs: @LABRCNTIP (procalcitonin:4,lacticidven:4) )No results found for this or any previous visit (from the past 240 hour(s)).   Radiological Exams on Admission: Ct Abdomen Pelvis W Contrast  Result Date: 11/22/2017 CLINICAL DATA:  Acute onset of periumbilical abdominal pain tonight. Patient on Lovenox for DVT. Renal cell carcinoma. EXAM: CT ABDOMEN AND PELVIS WITH CONTRAST TECHNIQUE: Multidetector CT imaging of the abdomen and pelvis was performed using the standard protocol following bolus administration of intravenous contrast. CONTRAST:  175mL ISOVUE-300 IOPAMIDOL (ISOVUE-300) INJECTION 61% COMPARISON:  CT angiography 11/15/2017 FINDINGS: Lower chest: Left pleural thickening.  No frank effusion. Hepatobiliary: 7 mm low-density lesion in the central dome of the liver, unchanged. No new hepatic lesion. Gallbladder physiologically distended, no calcified stone. No biliary dilatation. Pancreas: Low-density lesion in the mid pancreas measures 3.9 x 3.6 cm.  Pancreatic head lesion measures 13 x 17 mm. No ductal dilatation or peripancreatic stranding. Spleen: Prominent size spanning 13.9 cm. Adrenals/Urinary Tract: No dominant adrenal nodule. Again seen heterogeneous enhancing left renal lesion measuring approximately 5.3 x 4 cm extending into the left renal vein. No evidence of hemorrhage. No hydronephrosis. Simple cyst arises from the upper kidney measuring 3.7 cm. Post right nephrectomy. Urinary bladder is physiologically distended. Stomach/Bowel: Small hiatal hernia. Stomach physiologically distended. No bowel wall thickening, inflammatory change or obstruction. Normal appendix. Distal colonic diverticulosis without diverticulitis. Perianal air in the right tracking into the perineum is unchanged from the November 2018 CT. Vascular/Lymphatic: Filling defects within right common femoral and bilateral superficial femoral veins consistent with known DVT. No interval progression. No new  vascular finding. Normal caliber abdominal aorta. Reproductive: Prostatic calcifications. Other: No ascites or free air.  No intra-abdominal abscess. Musculoskeletal: There are no acute or suspicious osseous abnormalities. IMPRESSION: 1. No acute abnormality. 2. Unchanged left renal mass with invasion of the left renal vein. Unchanged pancreatic body and pancreatic head lesions, potentially metastasis. 3. DVT in the right common femoral bilateral superficial femoral veins, without significant progression from recent CT. 4. Mild colonic diverticulosis without diverticulitis. Mild splenomegaly. Electronically Signed   By: Jeb Levering M.D.   On: 11/22/2017 06:09    EKG: Independently reviewed.  None obtained in the ED  Assessment/Plan Principal Problem:   Acute pancreatitis Active Problems:   Pancreatic mass   Renal cell cancer ([facility]CC)   DVT, bilateral lower limbs (H[facility])    Abdominal pain -Believed secondary to acute pancreatitis, unclear if this is from his pancreatic mass. -We will treat conservatively with bowel rest, antiemetics and pain medication. -Start on IV fluids, morphine and Phenergan for nausea given his Zofran allergy.  Bilateral lower extremity DVT -Continue therapeutic Lovenox dosing  Renal cell carcinoma -Has follow-up scheduled next week with his oncologist in [place], [name]. -Is not currently on chemotherapeutic agents.  Mild pancytopenia -WBC count is 3.1, hemoglobin is 9.7, platelet count is 111. -This is likely related to ongoing malignancy and recent use of chemotherapeutic agents. -Continue to follow.   DVT prophylaxis: Lovenox Code Status: Full code Family Communication: Sister and brother-in-law at bedside updated on plan of care and all questions answered Disposition Plan: Pending improvement of abdominal pain and diet advancement Consults called: None Admission status: Inpatient   Time Spent: 85 minutes  Estela Isaac Bliss MD [facility] Pager [phone]  If 7PM-7AM, please contact night-coverage [email or web address] Password [email or web address]  11/22/2017, 1:47 PM

## 2017-11-23 DIAGNOSIS — K8501 Idiopathic acute pancreatitis with uninfected necrosis: Secondary | ICD-10-CM

## 2017-11-23 LAB — CBC
HEMATOCRIT: 26.8 % — AB (ref 39.0–52.0)
HEMOGLOBIN: 8.5 g/dL — AB (ref 13.0–17.0)
MCH: 31.4 pg (ref 26.0–34.0)
MCHC: 31.7 g/dL (ref 30.0–36.0)
MCV: 98.9 fL (ref 78.0–100.0)
Platelets: 124 10*3/uL — ABNORMAL LOW (ref 150–400)
RBC: 2.71 MIL/uL — AB (ref 4.22–5.81)
RDW: 18.6 % — ABNORMAL HIGH (ref 11.5–15.5)
WBC: 2.5 10*3/uL — AB (ref 4.0–10.5)

## 2017-11-23 LAB — COMPREHENSIVE METABOLIC PANEL
ALT: 13 U/L — ABNORMAL LOW (ref 17–63)
ANION GAP: 10 (ref 5–15)
AST: 19 U/L (ref 15–41)
Albumin: 2.4 g/dL — ABNORMAL LOW (ref 3.5–5.0)
Alkaline Phosphatase: 70 U/L (ref 38–126)
BUN: 9 mg/dL (ref 6–20)
CO2: 23 mmol/L (ref 22–32)
Calcium: 8.3 mg/dL — ABNORMAL LOW (ref 8.9–10.3)
Chloride: 104 mmol/L (ref 101–111)
Creatinine, Ser: 1 mg/dL (ref 0.61–1.24)
GFR calc non Af Amer: >60 mL/min (ref >60)
Glucose, Bld: 84 mg/dL (ref 65–99)
POTASSIUM: 3.8 mmol/L (ref 3.5–5.1)
Sodium: 137 mmol/L (ref 135–145)
TOTAL PROTEIN: 5.7 g/dL — AB (ref 6.5–8.1)
Total Bilirubin: 1 mg/dL (ref 0.3–1.2)

## 2017-11-23 MED ORDER — MORPHINE SULFATE (PF) 4 MG/ML IV SOLN
4.0000 mg | INTRAVENOUS | Status: DC | PRN
  Administered 2017-11-23 (×3): 4 mg via INTRAVENOUS
  Filled 2017-11-23 (×3): qty 1

## 2017-11-23 MED ORDER — HYDROMORPHONE HCL 1 MG/ML IJ SOLN
1.0000 mg | INTRAMUSCULAR | Status: DC | PRN
  Administered 2017-11-23 – 2017-11-24 (×9): 1 mg via INTRAVENOUS
  Filled 2017-11-23 (×9): qty 1

## 2017-11-23 MED ORDER — METHYLPREDNISOLONE SODIUM SUCC 125 MG IJ SOLR
60.0000 mg | Freq: Once | INTRAMUSCULAR | Status: AC
  Administered 2017-11-23: 60 mg via INTRAVENOUS
  Filled 2017-11-23: qty 2

## 2017-11-23 MED ORDER — PHENOL 1.4 % MT LIQD: 1.0000 | OROMUCOSAL | Status: DC | PRN

## 2017-11-23 NOTE — Progress Notes (Signed)
Patient reports Dilaudid has helped his pain, "More than the Morphine today.  I am able to rest."

## 2017-11-23 NOTE — Progress Notes (Signed)
PROGRESS NOTE    Jeffrey Trujillo  IWL:[id number] DOB: [birth year] DOA: 11/22/2017 PCP: [facility]     Brief Narrative:  67 year old man admitted from home on 3/8 with complaints of abdominal pain and nausea.  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  Admission was requested.   Assessment & Plan:   Principal Problem:   Acute pancreatitis Active Problems:   Pancreatic mass   Renal cell cancer (HCC)   DVT, bilateral lower limbs (HCC)   Acute pancreatitis -Likely related to pancreatic metastases from renal cell carcinoma. -Patient still having excruciating abdominal pain. -We will place on Dilaudid instead of morphine, continue  antiemetics, keep n.p.o. today.  Bilateral lower extremity DVT -Continue therapeutic Lovenox dosing.  Renal cell carcinoma -Continue follow-up with oncologist as scheduled.  Mild pancytopenia -Likely related to ongoing malignancy and recent use of chemotherapeutic agents. -Continue to follow.   DVT prophylaxis: Lovenox Code Status: Full code Family Communication: Patient only Disposition Plan: Home pending improvement in abdominal pain and diet advancement  Consultants:   None  Procedures:   None  Antimicrobials:  Anti-infectives (From admission, onward)   None       Subjective: Still with significant abdominal pain, states morphine helps for about 45 minutes.  Objective: Vitals:   11/22/17 0906 11/22/17 1300 11/22/17 2100 11/23/17 0622  BP: (!) 161/73 (!) 160/69 (!) 166/78 (!) 172/78  Pulse: 93 90 92 84  Resp: 18  19 20   Temp: 99 F (37.2 C) 98.9 F (37.2 C) 98.4 F (36.9 C) 99 F (37.2 C)  TempSrc: Oral Oral Oral   SpO2: 100% 99% 99% 100%  Weight: 81 kg (178 lb 9.2 oz)     Height: 5\' 11"  (1.803 m)       Intake/Output Summary (Last 24 hours) at 11/23/2017 1546 Last data filed at 11/23/2017 1500 Gross per 24 hour  Intake  1575 ml  Output 1100 ml  Net 475 ml   Filed Weights   11/22/17 0906  Weight: 81 kg (178 lb 9.2 oz)    Examination:  General exam: Alert, awake, oriented x 3 Respiratory system: Clear to auscultation. Respiratory effort normal. Cardiovascular system:RRR. No murmurs, rubs, gallops. Gastrointestinal system: Abdomen is nondistended, soft and tender to palpation of the epigastric and left upper quadrant. No organomegaly or masses felt. Normal bowel sounds heard. Central nervous system: Alert and oriented. No focal neurological deficits. Extremities: No C/C/E, +pedal pulses Skin: No rashes, lesions or ulcers Psychiatry: Judgement and insight appear normal. Mood & affect appropriate.     Data Reviewed: I have personally reviewed following labs and imaging studies  CBC: Recent Labs  Lab 11/22/17 0508 11/23/17 0409  WBC 3.1* 2.5*  NEUTROABS 2.4  --   HGB 9.7* 8.5*  HCT 31.1* 26.8*  MCV 98.4 98.9  PLT 111* 174*   Basic Metabolic Panel: Recent Labs  Lab 11/22/17 0508 11/23/17 0409  NA 141 137  K 3.4* 3.8  CL 104 104  CO2 24 23  GLUCOSE 123* 84  BUN 13 9  CREATININE 1.25* 1.00  CALCIUM 9.3 8.3*   GFR: Estimated Creatinine Clearance: 77.4 mL/min (by C-G formula based on SCr of 1 mg/dL). Liver Function Tests: Recent Labs  Lab 11/22/17 0508 11/23/17 0409  AST 20 19  ALT 18 13*  ALKPHOS 83 70  BILITOT 0.8 1.0  PROT 6.3* 5.7*  ALBUMIN 2.8* 2.4*   Recent Labs  Lab 11/22/17 0508  LIPASE 835*   No results for input(s): AMMONIA in the last 168 hours. Coagulation Profile: No results for input(s): INR, PROTIME in the last 168 hours. Cardiac Enzymes: No results for input(s): CKTOTAL, CKMB, CKMBINDEX, TROPONINI in the last 168 hours. BNP (last 3 results) No results for input(s): PROBNP in the last 8760 hours. HbA1C: No results for input(s): HGBA1C in the last 72 hours. CBG: No results for input(s): GLUCAP in the last 168 hours. Lipid Profile: No results for  input(s): CHOL, HDL, LDLCALC, TRIG, CHOLHDL, LDLDIRECT in the last 72 hours. Thyroid Function Tests: No results for input(s): TSH, T4TOTAL, FREET4, T3FREE, THYROIDAB in the last 72 hours. Anemia Panel: No results for input(s): VITAMINB12, FOLATE, FERRITIN, TIBC, IRON, RETICCTPCT in the last 72 hours. Urine analysis:    Component Value Date/Time   COLORURINE YELLOW 11/22/2017 0627   APPEARANCEUR CLEAR 11/22/2017 0627   LABSPEC 1.028 11/22/2017 0627   PHURINE 8.0 11/22/2017 0627   GLUCOSEU NEGATIVE 11/22/2017 0627   HGBUR NEGATIVE 11/22/2017 0627   BILIRUBINUR NEGATIVE 11/22/2017 0627   KETONESUR NEGATIVE 11/22/2017 0627   PROTEINUR NEGATIVE 11/22/2017 0627   UROBILINOGEN 4.0 (H) 02/11/2015 1409   NITRITE NEGATIVE 11/22/2017 0627   LEUKOCYTESUR NEGATIVE 11/22/2017 0627   Sepsis Labs: @LABRCNTIP (procalcitonin:4,lacticidven:4)  )No results found for this or any previous visit (from the past 240 hour(s)).       Radiology Studies: Ct Abdomen Pelvis W Contrast  Result Date: 11/22/2017 CLINICAL DATA:  Acute onset of periumbilical abdominal pain tonight. Patient on Lovenox for DVT. Renal cell carcinoma. EXAM: CT ABDOMEN AND PELVIS WITH CONTRAST TECHNIQUE: Multidetector CT imaging of the abdomen and pelvis was performed using the standard protocol following bolus administration of intravenous contrast. CONTRAST:  138mL ISOVUE-300 IOPAMIDOL (ISOVUE-300) INJECTION 61% COMPARISON:  CT angiography 11/15/2017 FINDINGS: Lower chest: Left pleural thickening.  No frank effusion. Hepatobiliary: 7 mm low-density lesion in the central dome of the liver, unchanged. No new hepatic lesion. Gallbladder physiologically distended, no calcified stone. No biliary dilatation. Pancreas: Low-density lesion in the mid pancreas measures 3.9 x 3.6 cm. Pancreatic head lesion measures 13 x 17 mm. No ductal dilatation or peripancreatic stranding. Spleen: Prominent size spanning 13.9 cm. Adrenals/Urinary Tract: No dominant  adrenal nodule. Again seen heterogeneous enhancing left renal lesion measuring approximately 5.3 x 4 cm extending into the left renal vein. No evidence of hemorrhage. No hydronephrosis. Simple cyst arises from the upper kidney measuring 3.7 cm. Post right nephrectomy. Urinary bladder is physiologically distended. Stomach/Bowel: Small hiatal hernia. Stomach physiologically distended. No bowel wall thickening, inflammatory change or obstruction. Normal appendix. Distal colonic diverticulosis without diverticulitis. Perianal air in the right tracking into the perineum is unchanged from the November 2018 CT. Vascular/Lymphatic: Filling defects within right common femoral and bilateral superficial femoral veins consistent with known DVT. No interval progression. No new vascular finding. Normal caliber abdominal aorta. Reproductive: Prostatic calcifications. Other: No ascites or free air.  No intra-abdominal abscess. Musculoskeletal: There are no acute or suspicious osseous abnormalities. IMPRESSION: 1. No acute abnormality. 2. Unchanged left renal mass with invasion of the left renal vein. Unchanged pancreatic body and pancreatic head lesions, potentially metastasis. 3. DVT in the right common femoral bilateral superficial femoral veins, without significant progression from recent CT. 4. Mild colonic diverticulosis without diverticulitis. Mild splenomegaly. Electronically Signed   By: Jeb Levering M.D.   On: 11/22/2017 06:09        Scheduled Meds: . enoxaparin  120 mg Subcutaneous Q24H   Continuous  Infusions: . sodium chloride 75 mL/hr at 11/23/17 0600     LOS: 1 day    Time spent: 25 minutes. Greater than 50% of this time was spent in direct contact with the patient coordinating care.     Jeffrey Frohlich, MD [facility] Pager [phone]  If 7PM-7AM, please contact night-coverage [email or web address] Password [facility] 11/23/2017, 3:46 PM

## 2017-11-24 MED ORDER — METHYLPREDNISOLONE SODIUM SUCC 125 MG IJ SOLR
60.0000 mg | Freq: Once | INTRAMUSCULAR | Status: AC
  Administered 2017-11-24: 60 mg via INTRAVENOUS
  Filled 2017-11-24: qty 2

## 2017-11-24 MED ORDER — HYDROMORPHONE HCL 1 MG/ML IJ SOLN
1.0000 mg | INTRAMUSCULAR | Status: DC | PRN
  Administered 2017-11-24 – 2017-11-25 (×4): 1 mg via INTRAVENOUS
  Filled 2017-11-24 (×4): qty 1

## 2017-11-24 NOTE — Progress Notes (Signed)
Patient advanced to clear liquid diet at lunch.  Tolerated well.  No complaints of increased pain or nausea/vomiting.

## 2017-11-24 NOTE — Progress Notes (Signed)
PROGRESS NOTE    Jeffrey Trujillo  KKX:[id number] DOB: Jun 18, 1951 DOA: 11/22/2017 PCP: [facility]     Brief Narrative:  67 year old man admitted from home on 3/8 with complaints of abdominal pain and nausea.  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  Admission was requested.   Assessment & Plan:   Principal Problem:   Acute pancreatitis Active Problems:   Pancreatic mass   Renal cell cancer (HCC)   DVT, bilateral lower limbs (HCC)   Acute pancreatitis -Likely related to pancreatic metastases from renal cell carcinoma. -Abdominal pain improved today after morphine was swapped out for Dilaudid. -He would like to try a diet today.  We will start on clear liquids and space out Dilaudid a little bit further.  Bilateral lower extremity DVT -Continue therapeutic Lovenox dosing.  Renal cell carcinoma -Continue follow-up with oncologist as scheduled.  Mild pancytopenia -Likely related to ongoing malignancy and recent use of chemotherapeutic agents. -Continue to follow.  Acute gout of right knee -Received a dose of Solu-Medrol yesterday, improved but still with significant pain and redness. -We will give another dose of IV Solu-Medrol today, will plan on starting a prednisone taper tomorrow.   DVT prophylaxis: Lovenox Code Status: Full code Family Communication: Sister at bedside updated on plan of care and all questions answered Disposition Plan: Home pending improvement in abdominal pain and diet advancement  Consultants:   None  Procedures:   None  Antimicrobials:  Anti-infectives (From admission, onward)   None       Subjective: Abdominal pain is significantly improved on Dilaudid, he would like to try clear liquids today.  Objective: Vitals:   11/22/17 2100 11/23/17 0622 11/23/17 1300 11/23/17 2100  BP: (!) 166/78 (!) 172/78 (!) 150/68 139/73  Pulse: 92 84  80 78  Resp: 19 20 19 20   Temp: 98.4 F (36.9 C) 99 F (37.2 C) 98.9 F (37.2 C) 97.6 F (36.4 C)  TempSrc: Oral  Oral Oral  SpO2: 99% 100% 100% 97%  Weight:      Height:        Intake/Output Summary (Last 24 hours) at 11/24/2017 1526 Last data filed at 11/24/2017 0837 Gross per 24 hour  Intake 1497.5 ml  Output 350 ml  Net 1147.5 ml   Filed Weights   11/22/17 0906  Weight: 81 kg (178 lb 9.2 oz)    Examination:  General exam: Alert, awake, oriented x 3 Respiratory system: Clear to auscultation. Respiratory effort normal. Cardiovascular system:RRR. No murmurs, rubs, gallops. Gastrointestinal system: Abdomen is nondistended, soft and nontender. No organomegaly or masses felt. Normal bowel sounds heard. Central nervous system: Alert and oriented. No focal neurological deficits. Extremities: No C/C/E, +pedal pulses Skin: No rashes, lesions or ulcers Psychiatry: Judgement and insight appear normal. Mood & affect appropriate.      Data Reviewed: I have personally reviewed following labs and imaging studies  CBC: Recent Labs  Lab 11/22/17 0508 11/23/17 0409  WBC 3.1* 2.5*  NEUTROABS 2.4  --   HGB 9.7* 8.5*  HCT 31.1* 26.8*  MCV 98.4 98.9  PLT 111* 169*   Basic Metabolic Panel: Recent Labs  Lab 11/22/17 0508 11/23/17 0409  NA 141 137  K 3.4* 3.8  CL 104 104  CO2 24 23  GLUCOSE 123* 84  BUN 13 9  CREATININE 1.25* 1.00  CALCIUM 9.3 8.3*   GFR: Estimated Creatinine Clearance: 77.4 mL/min (by  C-G formula based on SCr of 1 mg/dL). Liver Function Tests: Recent Labs  Lab 11/22/17 0508 11/23/17 0409  AST 20 19  ALT 18 13*  ALKPHOS 83 70  BILITOT 0.8 1.0  PROT 6.3* 5.7*  ALBUMIN 2.8* 2.4*   Recent Labs  Lab 11/22/17 0508  LIPASE 835*   No results for input(s): AMMONIA in the last 168 hours. Coagulation Profile: No results for input(s): INR, PROTIME in the last 168 hours. Cardiac Enzymes: No results for input(s): CKTOTAL, CKMB, CKMBINDEX,  TROPONINI in the last 168 hours. BNP (last 3 results) No results for input(s): PROBNP in the last 8760 hours. HbA1C: No results for input(s): HGBA1C in the last 72 hours. CBG: No results for input(s): GLUCAP in the last 168 hours. Lipid Profile: No results for input(s): CHOL, HDL, LDLCALC, TRIG, CHOLHDL, LDLDIRECT in the last 72 hours. Thyroid Function Tests: No results for input(s): TSH, T4TOTAL, FREET4, T3FREE, THYROIDAB in the last 72 hours. Anemia Panel: No results for input(s): VITAMINB12, FOLATE, FERRITIN, TIBC, IRON, RETICCTPCT in the last 72 hours. Urine analysis:    Component Value Date/Time   COLORURINE YELLOW 11/22/2017 0627   APPEARANCEUR CLEAR 11/22/2017 0627   LABSPEC 1.028 11/22/2017 0627   PHURINE 8.0 11/22/2017 0627   GLUCOSEU NEGATIVE 11/22/2017 0627   HGBUR NEGATIVE 11/22/2017 0627   BILIRUBINUR NEGATIVE 11/22/2017 0627   KETONESUR NEGATIVE 11/22/2017 0627   PROTEINUR NEGATIVE 11/22/2017 0627   UROBILINOGEN 4.0 (H) 02/11/2015 1409   NITRITE NEGATIVE 11/22/2017 0627   LEUKOCYTESUR NEGATIVE 11/22/2017 0627   Sepsis Labs: @LABRCNTIP (procalcitonin:4,lacticidven:4)  )No results found for this or any previous visit (from the past 240 hour(s)).       Radiology Studies: No results found.      Scheduled Meds: . enoxaparin  120 mg Subcutaneous Q24H   Continuous Infusions: . sodium chloride 100 mL/hr at 11/24/17 1428     LOS: 2 days    Time spent: 25 minutes. Greater than 50% of this time was spent in direct contact with the patient coordinating care.     Lelon Frohlich, MD [facility] Pager [phone]  If 7PM-7AM, please contact night-coverage [email or web address] Password [facility] 11/24/2017, 3:26 PM

## 2017-11-25 LAB — CBC
HEMATOCRIT: 25.4 % — AB (ref 39.0–52.0)
HEMOGLOBIN: 7.9 g/dL — AB (ref 13.0–17.0)
MCH: 31.9 pg (ref 26.0–34.0)
MCHC: 31.1 g/dL (ref 30.0–36.0)
MCV: 102.4 fL — ABNORMAL HIGH (ref 78.0–100.0)
Platelets: 155 10*3/uL (ref 150–400)
RBC: 2.48 MIL/uL — AB (ref 4.22–5.81)
RDW: 17.5 % — ABNORMAL HIGH (ref 11.5–15.5)
WBC: 2.2 10*3/uL — AB (ref 4.0–10.5)

## 2017-11-25 MED ORDER — HYDROMORPHONE HCL 1 MG/ML IJ SOLN
1.0000 mg | Freq: Four times a day (QID) | INTRAMUSCULAR | Status: DC | PRN
Start: 2017-11-25 — End: 2017-11-26
  Administered 2017-11-25 – 2017-11-26 (×4): 1 mg via INTRAVENOUS
  Filled 2017-11-25 (×4): qty 1

## 2017-11-25 MED ORDER — PREDNISONE 20 MG PO TABS
30.0000 mg | ORAL_TABLET | Freq: Every day | ORAL | Status: DC
  Administered 2017-11-25 – 2017-11-26 (×2): 30 mg via ORAL
  Filled 2017-11-25 (×2): qty 1

## 2017-11-25 MED ORDER — HYDROCODONE-ACETAMINOPHEN 5-325 MG PO TABS
1.0000 | ORAL_TABLET | Freq: Four times a day (QID) | ORAL | Status: DC | PRN
  Administered 2017-11-25 (×2): 2 via ORAL
  Administered 2017-11-26 (×2): 1 via ORAL
  Administered 2017-11-26: 2 via ORAL
  Filled 2017-11-25: qty 2
  Filled 2017-11-25: qty 1
  Filled 2017-11-25: qty 2
  Filled 2017-11-25: qty 1
  Filled 2017-11-25: qty 2

## 2017-11-25 NOTE — Progress Notes (Signed)
PROGRESS NOTE    Jeffrey Trujillo  DPO:[id number] DOB: 03/08/1951 DOA: 11/22/2017 PCP: [facility]     Brief Narrative:  67 year old man admitted from home on 3/8 with complaints of abdominal pain and nausea.  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  Admission was requested.   Assessment & Plan:   Principal Problem:   Acute pancreatitis Active Problems:   Pancreatic mass   Renal cell cancer (HCC)   DVT, bilateral lower limbs (HCC)   Acute pancreatitis -Likely related to pancreatic metastases from renal cell carcinoma. -Abdominal pain though still present is improved. -Tolerated clear liquids without issues. -We will advance to low-fat diet, place on oral pain medication in anticipation for discharge home over the next 24 hours.  Bilateral lower extremity DVT -Continue therapeutic Lovenox dosing.  Renal cell carcinoma -Continue follow-up with oncologist as scheduled.  Mild pancytopenia -Likely related to ongoing malignancy and recent use of chemotherapeutic agents. -Continue to follow.  Acute gout of right knee -Significantly improved following dose of IV Solu-Medrol on 3/10. -We will start prednisone today which will continue for 3-4 days.  DVT prophylaxis: Lovenox Code Status: Full code Family Communication: Patient only, anticipate 24 hours Disposition Plan: Home pending improvement in abdominal pain and diet advancement  Consultants:   None  Procedures:   None  Antimicrobials:  Anti-infectives (From admission, onward)   None       Subjective: Tolerated clear liquids, wants diet advanced, less abdominal pain.  Objective: Vitals:   11/23/17 2100 11/24/17 1500 11/24/17 2100 11/25/17 0500  BP: 139/73 (!) 161/75 (!) 158/75 138/85  Pulse: 78 60 60 61  Resp: 20 20 19 18   Temp: 97.6 F (36.4 C) 98.3 F (36.8 C) 98 F (36.7 C) 97.8 F (36.6 C)    TempSrc: Oral Oral Oral Oral  SpO2: 97% 98% 99% 100%  Weight:      Height:        Intake/Output Summary (Last 24 hours) at 11/25/2017 1309 Last data filed at 11/25/2017 0900 Gross per 24 hour  Intake 2916.66 ml  Output 250 ml  Net 2666.66 ml   Filed Weights   11/22/17 0906  Weight: 81 kg (178 lb 9.2 oz)    Examination:  General exam: Alert, awake, oriented x 3 Respiratory system: Clear to auscultation. Respiratory effort normal. Cardiovascular system:RRR. No murmurs, rubs, gallops. Gastrointestinal system: Abdomen is nondistended, soft and nontender. No organomegaly or masses felt. Normal bowel sounds heard. Central nervous system: Alert and oriented. No focal neurological deficits. Extremities: No C/C/E, +pedal pulses Skin: No rashes, lesions or ulcers Psychiatry: Judgement and insight appear normal. Mood & affect appropriate.       Data Reviewed: I have personally reviewed following labs and imaging studies  CBC: Recent Labs  Lab 11/22/17 0508 11/23/17 0409 11/25/17 0551  WBC 3.1* 2.5* 2.2*  NEUTROABS 2.4  --   --   HGB 9.7* 8.5* 7.9*  HCT 31.1* 26.8* 25.4*  MCV 98.4 98.9 102.4*  PLT 111* 124* 431   Basic Metabolic Panel: Recent Labs  Lab 11/22/17 0508 11/23/17 0409  NA 141 137  K 3.4* 3.8  CL 104 104  CO2 24 23  GLUCOSE 123* 84  BUN 13 9  CREATININE 1.25* 1.00  CALCIUM 9.3 8.3*   GFR: Estimated Creatinine Clearance: 77.4 mL/min (by C-G formula based on SCr of 1 mg/dL). Liver Function Tests: Recent Labs  Lab  11/22/17 0508 11/23/17 0409  AST 20 19  ALT 18 13*  ALKPHOS 83 70  BILITOT 0.8 1.0  PROT 6.3* 5.7*  ALBUMIN 2.8* 2.4*   Recent Labs  Lab 11/22/17 0508  LIPASE 835*   No results for input(s): AMMONIA in the last 168 hours. Coagulation Profile: No results for input(s): INR, PROTIME in the last 168 hours. Cardiac Enzymes: No results for input(s): CKTOTAL, CKMB, CKMBINDEX, TROPONINI in the last 168 hours. BNP (last 3 results) No  results for input(s): PROBNP in the last 8760 hours. HbA1C: No results for input(s): HGBA1C in the last 72 hours. CBG: No results for input(s): GLUCAP in the last 168 hours. Lipid Profile: No results for input(s): CHOL, HDL, LDLCALC, TRIG, CHOLHDL, LDLDIRECT in the last 72 hours. Thyroid Function Tests: No results for input(s): TSH, T4TOTAL, FREET4, T3FREE, THYROIDAB in the last 72 hours. Anemia Panel: No results for input(s): VITAMINB12, FOLATE, FERRITIN, TIBC, IRON, RETICCTPCT in the last 72 hours. Urine analysis:    Component Value Date/Time   COLORURINE YELLOW 11/22/2017 0627   APPEARANCEUR CLEAR 11/22/2017 0627   LABSPEC 1.028 11/22/2017 0627   PHURINE 8.0 11/22/2017 0627   GLUCOSEU NEGATIVE 11/22/2017 0627   HGBUR NEGATIVE 11/22/2017 0627   BILIRUBINUR NEGATIVE 11/22/2017 0627   KETONESUR NEGATIVE 11/22/2017 0627   PROTEINUR NEGATIVE 11/22/2017 0627   UROBILINOGEN 4.0 (H) 02/11/2015 1409   NITRITE NEGATIVE 11/22/2017 0627   LEUKOCYTESUR NEGATIVE 11/22/2017 0627   Sepsis Labs: @LABRCNTIP (procalcitonin:4,lacticidven:4)  )No results found for this or any previous visit (from the past 240 hour(s)).       Radiology Studies: No results found.      Scheduled Meds: . enoxaparin  120 mg Subcutaneous Q24H  . predniSONE  30 mg Oral Daily   Continuous Infusions: . sodium chloride 100 mL/hr at 11/25/17 1159     LOS: 3 days    Time spent: 25 minutes. Greater than 50% of this time was spent in direct contact with the patient coordinating care.     Lelon Frohlich, MD [facility] Pager [phone]  If 7PM-7AM, please contact night-coverage [email or web address] Password [email or web address] 11/25/2017, 1:09 PM

## 2017-11-26 DIAGNOSIS — K8500 Idiopathic acute pancreatitis without necrosis or infection: Secondary | ICD-10-CM

## 2017-11-26 MED ORDER — OXYCODONE HCL 5 MG PO TABS: 5.0000 mg | ORAL_TABLET | Freq: Four times a day (QID) | ORAL | 0 refills | Status: DC | PRN

## 2017-11-26 MED ORDER — PREDNISONE 10 MG PO TABS: 10.0000 mg | ORAL_TABLET | Freq: Every day | ORAL | 0 refills | Status: DC

## 2017-11-26 NOTE — Discharge Summary (Signed)
Physician Discharge Summary  Jeffrey Trujillo EUM:[id number] DOB: 14-Apr-1951 DOA: 11/22/2017  PCP: [facility] date: 11/22/2017 Discharge date: 11/26/2017  Time spent: 45 minutes  Recommendations for Outpatient Follow-up:  -Will be discharged home today. -Advised to follow up with PCP in 2 weeks and with his oncologist as scheduled.  Discharge Diagnoses:  Principal Problem:   Acute pancreatitis Active Problems:   Pancreatic mass   Renal cell cancer (HCC)   DVT, bilateral lower limbs (HCC)   Discharge Condition: Stable and improved  Filed Weights   11/22/17 0906  Weight: 81 kg (178 lb 9.2 oz)    History of present illness:  Jeffrey Trujillo is a 67 y.o. 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  Was evaluated by vascular surgery who did not believe there was a role for thrombectomy.  He was placed on therapeutic doses of Lovenox.  He returns today due to significant abdominal pain and nausea.  Is found to have a lipase that is elevated above 800 and admission is requested for management of acute pancreatitis and pain control.    Hospital Course:   Acute pancreatitis -Likely related to pancreatic metastases from renal cell carcinoma. -Abdominal pain though still present is significantly improved to the point where he believes he can manage this at home. -Tolerated clear liquids without issues.  Has tolerated a solid diet without issues. -We will discharge with some OxyIRfor pain management purposes.  Bilateral lower extremity DVT -Continue therapeutic Lovenox dosing.  Renal cell carcinoma -Continue follow-up with oncologist as scheduled.  Mild pancytopenia -Likely related to ongoing malignancy and recent  use of chemotherapeutic agents. -Continue to follow.  Acute gout of right knee -Significantly improved after dose of IV Solu-Medrol. -We will discharge home with a 3-day prednisone taper.    Procedures:  None   Consultations:  None  Discharge Instructions  Discharge Instructions    Diet - low sodium heart healthy   Complete by:  As directed    Increase activity slowly   Complete by:  As directed      Allergies as of 11/26/2017      Reactions   Amoxicillin Nausea And Vomiting   Nsaids Other (See Comments)   Patient only has one kidney.    Zofran [ondansetron Hcl] Hives   Demerol [meperidine] Rash      Medication List    STOP taking these medications   HYDROcodone-acetaminophen 5-325 MG tablet Commonly known as:  NORCO/VICODIN     TAKE these medications   allopurinol 100 MG tablet Commonly known as:  ZYLOPRIM Take 0.5 tablets (50 mg total) by mouth 2 (two) times daily. What changed:  how much to take   enoxaparin 120 MG/0.8ML injection Commonly known as:  LOVENOX Inject 0.8 mLs (120 mg total) into the skin daily.   oxyCODONE 5 MG immediate release tablet Commonly known as:  Oxy IR/ROXICODONE Take 1 tablet (5 mg total) by mouth every 6 (six) hours as needed for severe pain.   predniSONE 10 MG tablet Commonly known as:  DELTASONE Take 1 tablet (10 mg total) by mouth daily with breakfast. Take 3 tablets today, 2 tomorrow and 1 the day after and then discontinue.   TYLENOL 500 MG tablet Generic drug:  acetaminophen Take  500-1,000 mg by mouth every 6 (six) hours as needed for mild pain.      Allergies  Allergen Reactions  . Amoxicillin Nausea And Vomiting  . Nsaids Other (See Comments)    Patient only has one kidney.   Marland Kitchen Zofran [Ondansetron Hcl] Hives  . Demerol [Meperidine] Rash   Follow-up Information    [facility] up on 12/16/2017.   Why:  at 2:30 pm Contact information: [address] [place]  [address] [phone]            The results of significant diagnostics from this hospitalization (including imaging, microbiology, ancillary and laboratory) are listed below for reference.    Significant Diagnostic Studies: Dg Chest 2 View  Result Date: 11/15/2017 CLINICAL DATA:  LOWER extremity edema. EXAM: CHEST  2 VIEW COMPARISON:  11/15/2016 and prior radiographs FINDINGS: Cardiomediastinal silhouette is unremarkable. LEFT basilar pleuroparenchymal scarring again noted. There is no evidence of focal airspace disease, pulmonary edema, suspicious pulmonary nodule/mass, pleural effusion, or pneumothorax. No acute bony abnormalities are identified. IMPRESSION: No evidence of acute cardiopulmonary disease. Electronically Signed   By: Margarette Canada M.D.   On: 11/15/2017 13:34   Ct Abdomen Pelvis W Contrast  Result Date: 11/22/2017 CLINICAL DATA:  Acute onset of periumbilical abdominal pain tonight. Patient on Lovenox for DVT. Renal cell carcinoma. EXAM: CT ABDOMEN AND PELVIS WITH CONTRAST TECHNIQUE: Multidetector CT imaging of the abdomen and pelvis was performed using the standard protocol following bolus administration of intravenous contrast. CONTRAST:  165mL ISOVUE-300 IOPAMIDOL (ISOVUE-300) INJECTION 61% COMPARISON:  CT angiography 11/15/2017 FINDINGS: Lower chest: Left pleural thickening.  No frank effusion. Hepatobiliary: 7 mm low-density lesion in the central dome of the liver, unchanged. No new hepatic lesion. Gallbladder physiologically distended, no calcified stone. No biliary dilatation. Pancreas: Low-density lesion in the mid pancreas measures 3.9 x 3.6 cm. Pancreatic head lesion measures 13 x 17 mm. No ductal dilatation or peripancreatic stranding. Spleen: Prominent size spanning 13.9 cm. Adrenals/Urinary Tract: No dominant adrenal nodule. Again seen heterogeneous enhancing left renal lesion measuring approximately 5.3 x 4 cm extending into the left renal vein. No evidence of hemorrhage. No  hydronephrosis. Simple cyst arises from the upper kidney measuring 3.7 cm. Post right nephrectomy. Urinary bladder is physiologically distended. Stomach/Bowel: Small hiatal hernia. Stomach physiologically distended. No bowel wall thickening, inflammatory change or obstruction. Normal appendix. Distal colonic diverticulosis without diverticulitis. Perianal air in the right tracking into the perineum is unchanged from the November 2018 CT. Vascular/Lymphatic: Filling defects within right common femoral and bilateral superficial femoral veins consistent with known DVT. No interval progression. No new vascular finding. Normal caliber abdominal aorta. Reproductive: Prostatic calcifications. Other: No ascites or free air.  No intra-abdominal abscess. Musculoskeletal: There are no acute or suspicious osseous abnormalities. IMPRESSION: 1. No acute abnormality. 2. Unchanged left renal mass with invasion of the left renal vein. Unchanged pancreatic body and pancreatic head lesions, potentially metastasis. 3. DVT in the right common femoral bilateral superficial femoral veins, without significant progression from recent CT. 4. Mild colonic diverticulosis without diverticulitis. Mild splenomegaly. Electronically Signed   By: Jeb Levering M.D.   On: 11/22/2017 06:09   US Venous Img Lower Bilateral  Result Date: 11/15/2017 CLINICAL DATA:  Bilateral lower extremity edema for the past week. Former smoker. History of metastatic renal cell carcinoma. Evaluate for DVT. EXAM: BILATERAL LOWER EXTREMITY VENOUS DOPPLER ULTRASOUND TECHNIQUE: Gray-scale sonography with graded compression, as well as color Doppler and duplex ultrasound were performed  to evaluate the lower extremity deep venous systems from the level of the common femoral vein and including the common femoral, femoral, profunda femoral, popliteal and calf veins including the posterior tibial, peroneal and gastrocnemius veins when visible. The superficial great  saphenous vein was also interrogated. Spectral Doppler was utilized to evaluate flow at rest and with distal augmentation maneuvers in the common femoral, femoral and popliteal veins. COMPARISON:  None. FINDINGS: RIGHT LOWER EXTREMITY There is mixed echogenic occlusive DVT within the right common femoral vein (image 4), extending to the proximal (image 13), mid (image 17) and distal (image 21) aspects of the right femoral vein and the right popliteal vein (image 25). The right deep femoral (image 12) and tibial veins vein appears patent where imaged. The right saphenofemoral junction and greater saphenous veins appear patent where imaged. Other Findings:  None. LEFT LOWER EXTREMITY There is hypoechoic nonocclusive thrombus within the left common femoral vein. There is hypoechoic occlusive DVT seen throughout the interrogated course of the left deep femoral vein (image 44), proximal (image 46), mid (image 51) and distal (image 55) aspects of the femoral vein, extending to involve the left popliteal vein (image 58) and imaged portions of the tibial veins. The left saphenofemoral junction and greater saphenous vein appear patent where imaged. Other Findings:  None. IMPRESSION: Examination is positive for extensive bilateral largely occlusive DVT as detailed above. Electronically Signed   By: Sandi Mariscal M.D.   On: 11/15/2017 14:21   Ct Angio Abd/pel W/ And/or W/o  Result Date: 11/15/2017 CLINICAL DATA:  Portal vein thrombosis. EXAM: CTA ABDOMEN AND PELVIS wITHOUT AND WITH CONTRAST TECHNIQUE: Multidetector CT imaging of the abdomen and pelvis was performed using the standard protocol during bolus administration of intravenous contrast. Multiplanar reconstructed images and MIPs were obtained and reviewed to evaluate the vascular anatomy. CONTRAST:  169mL ISOVUE-300 IOPAMIDOL (ISOVUE-300) INJECTION 61% COMPARISON:  CT scan of July 30, 2017. FINDINGS: VASCULAR Aorta: Normal caliber aorta without aneurysm,  dissection, vasculitis or significant stenosis. Celiac: Patent without evidence of aneurysm, dissection, vasculitis or significant stenosis. SMA: Patent without evidence of aneurysm, dissection, vasculitis or significant stenosis. Renals: Left renal artery appears normal. Status post right nephrectomy. IMA: Patent without evidence of aneurysm, dissection, vasculitis or significant stenosis. Inflow: Patent without evidence of aneurysm, dissection, vasculitis or significant stenosis. Proximal Outflow: Bilateral common femoral and visualized portions of the superficial and profunda femoral arteries are patent without evidence of aneurysm, dissection, vasculitis or significant stenosis. Veins: Filling defects are noted in both superficial femoral veins, as well as right common femoral vein, concerning for deep venous thrombosis. Doppler study is recommended for further evaluation. There is no definite evidence of thrombus seen in the IVC. Tumor from left renal mass is seen extending into the left renal vein. The splenic, superior mesenteric and portal veins are widely patent. Review of the MIP images confirms the above findings. NON-VASCULAR Lower chest: No acute abnormality. Hepatobiliary: No focal liver abnormality is seen. No gallstones, gallbladder wall thickening, or biliary dilatation. Pancreas: Stable 3.4 cm rounded abnormality seen in pancreatic body concerning for possible malignancy. No ductal dilatation is noted. Spleen: Normal in size without focal abnormality. Adrenals/Urinary Tract: Status post right nephrectomy. Adrenal glands are unremarkable. 5.3 x 4.3 cm mass is seen involving upper pole of left kidney, with extension of tumor into the left renal vein as previously noted. No hydronephrosis or renal obstruction is noted. Stomach/Bowel: Stomach is within normal limits. Appendix appears normal. No evidence of bowel wall thickening,  distention, or inflammatory changes. Lymphatic: No significant adenopathy  is noted. Reproductive: Prostate is unremarkable. Other: No abdominal wall hernia or abnormality. No abdominopelvic ascites. Musculoskeletal: No acute or significant osseous findings. IMPRESSION: VASCULAR Thrombosis of both superficial femoral and right common femoral veins is noted consistent with deep venous thrombosis. The IVC, portal, superior mesenteric and splenic veins are widely patent. NON-VASCULAR 5.3 x 4.3 cm left renal mass is noted with extension of tumor into the left renal vein. Electronically Signed   By: Marijo Conception, M.D.   On: 11/15/2017 21:11    Microbiology: No results found for this or any previous visit (from the past 240 hour(s)).   Labs: Basic Metabolic Panel: Recent Labs  Lab 11/22/17 0508 11/23/17 0409  NA 141 137  K 3.4* 3.8  CL 104 104  CO2 24 23  GLUCOSE 123* 84  BUN 13 9  CREATININE 1.25* 1.00  CALCIUM 9.3 8.3*   Liver Function Tests: Recent Labs  Lab 11/22/17 0508 11/23/17 0409  AST 20 19  ALT 18 13*  ALKPHOS 83 70  BILITOT 0.8 1.0  PROT 6.3* 5.7*  ALBUMIN 2.8* 2.4*   Recent Labs  Lab 11/22/17 0508  LIPASE 835*   No results for input(s): AMMONIA in the last 168 hours. CBC: Recent Labs  Lab 11/22/17 0508 11/23/17 0409 11/25/17 0551  WBC 3.1* 2.5* 2.2*  NEUTROABS 2.4  --   --   HGB 9.7* 8.5* 7.9*  HCT 31.1* 26.8* 25.4*  MCV 98.4 98.9 102.4*  PLT 111* 124* 155   Cardiac Enzymes: No results for input(s): CKTOTAL, CKMB, CKMBINDEX, TROPONINI in the last 168 hours. BNP: BNP (last 3 results) Recent Labs    09/29/17 1227  BNP 58.0    ProBNP (last 3 results) No results for input(s): PROBNP in the last 8760 hours.  CBG: No results for input(s): GLUCAP in the last 168 hours.     Signed:  Lelon Frohlich  [facility] Pager: [phone] 11/26/2017, 3:14 PM

## 2017-11-26 NOTE — Progress Notes (Signed)
Pt discharged home today per Dr. Hernandez.  Pt's IV site D/C'd and WDL.  Pt's VSS.  Pt provided with home medication list, discharge instructions and prescriptions.  Verbalized understanding.  Pt left floor via WC in stable condition accompanied by RN. 

## 2017-11-26 NOTE — Care Management Important Message (Signed)
Important Message  Patient Details  Name: Jeffrey Trujillo MRN: [id number] Date of Birth: 1950-09-21   Medicare Important Message Given:  Yes    Sherald Barge, RN 11/26/2017, 10:17 AM

## 2017-11-28 ENCOUNTER — Encounter (HOSPITAL_COMMUNITY): Payer: Self-pay | Admitting: Emergency Medicine

## 2017-11-28 ENCOUNTER — Emergency Department (HOSPITAL_COMMUNITY): Payer: Medicare Other

## 2017-11-28 ENCOUNTER — Other Ambulatory Visit: Payer: Self-pay

## 2017-11-28 ENCOUNTER — Inpatient Hospital Stay (HOSPITAL_COMMUNITY)
Admission: EM | Admit: 2017-11-28 | Discharge: 2017-12-02 | DRG: 687 | Disposition: A | Discharge status: Home / Self Care | Payer: Medicare Other | Attending: Internal Medicine | Admitting: Internal Medicine

## 2017-11-28 DIAGNOSIS — Z79899 Other long term (current) drug therapy: Secondary | ICD-10-CM

## 2017-11-28 DIAGNOSIS — R7989 Other specified abnormal findings of blood chemistry: Secondary | ICD-10-CM | POA: Diagnosis present

## 2017-11-28 DIAGNOSIS — Z905 Acquired absence of kidney: Secondary | ICD-10-CM

## 2017-11-28 DIAGNOSIS — K8681 Exocrine pancreatic insufficiency: Secondary | ICD-10-CM | POA: Diagnosis present

## 2017-11-28 DIAGNOSIS — Z888 Allergy status to other drugs, medicaments and biological substances status: Secondary | ICD-10-CM

## 2017-11-28 DIAGNOSIS — R109 Unspecified abdominal pain: Secondary | ICD-10-CM | POA: Diagnosis not present

## 2017-11-28 DIAGNOSIS — Z9221 Personal history of antineoplastic chemotherapy: Secondary | ICD-10-CM

## 2017-11-28 DIAGNOSIS — M109 Gout, unspecified: Secondary | ICD-10-CM | POA: Diagnosis present

## 2017-11-28 DIAGNOSIS — E876 Hypokalemia: Secondary | ICD-10-CM | POA: Diagnosis not present

## 2017-11-28 DIAGNOSIS — Z7952 Long term (current) use of systemic steroids: Secondary | ICD-10-CM

## 2017-11-28 DIAGNOSIS — Z515 Encounter for palliative care: Secondary | ICD-10-CM | POA: Diagnosis present

## 2017-11-28 DIAGNOSIS — N183 Chronic kidney disease, stage 3 (moderate): Secondary | ICD-10-CM | POA: Diagnosis present

## 2017-11-28 DIAGNOSIS — K5900 Constipation, unspecified: Secondary | ICD-10-CM | POA: Diagnosis present

## 2017-11-28 DIAGNOSIS — Z8711 Personal history of peptic ulcer disease: Secondary | ICD-10-CM

## 2017-11-28 DIAGNOSIS — C649 Malignant neoplasm of unspecified kidney, except renal pelvis: Secondary | ICD-10-CM | POA: Diagnosis present

## 2017-11-28 DIAGNOSIS — C642 Malignant neoplasm of left kidney, except renal pelvis: Secondary | ICD-10-CM | POA: Diagnosis not present

## 2017-11-28 DIAGNOSIS — G8929 Other chronic pain: Secondary | ICD-10-CM | POA: Diagnosis present

## 2017-11-28 DIAGNOSIS — R945 Abnormal results of liver function studies: Secondary | ICD-10-CM

## 2017-11-28 DIAGNOSIS — Z66 Do not resuscitate: Secondary | ICD-10-CM | POA: Diagnosis present

## 2017-11-28 DIAGNOSIS — I82403 Acute embolism and thrombosis of unspecified deep veins of lower extremity, bilateral: Secondary | ICD-10-CM | POA: Diagnosis present

## 2017-11-28 DIAGNOSIS — K219 Gastro-esophageal reflux disease without esophagitis: Secondary | ICD-10-CM | POA: Diagnosis present

## 2017-11-28 DIAGNOSIS — Z7189 Other specified counseling: Secondary | ICD-10-CM

## 2017-11-28 DIAGNOSIS — E222 Syndrome of inappropriate secretion of antidiuretic hormone: Secondary | ICD-10-CM | POA: Diagnosis present

## 2017-11-28 DIAGNOSIS — D631 Anemia in chronic kidney disease: Secondary | ICD-10-CM | POA: Diagnosis present

## 2017-11-28 DIAGNOSIS — F17220 Nicotine dependence, chewing tobacco, uncomplicated: Secondary | ICD-10-CM | POA: Diagnosis present

## 2017-11-28 DIAGNOSIS — D649 Anemia, unspecified: Secondary | ICD-10-CM | POA: Diagnosis present

## 2017-11-28 LAB — CBC
HEMATOCRIT: 30.9 % — AB (ref 39.0–52.0)
Hemoglobin: 9.6 g/dL — ABNORMAL LOW (ref 13.0–17.0)
MCH: 30.9 pg (ref 26.0–34.0)
MCHC: 31.1 g/dL (ref 30.0–36.0)
MCV: 99.4 fL (ref 78.0–100.0)
Platelets: 226 10*3/uL (ref 150–400)
RBC: 3.11 MIL/uL — AB (ref 4.22–5.81)
RDW: 18 % — AB (ref 11.5–15.5)
WBC: 2.6 10*3/uL — AB (ref 4.0–10.5)

## 2017-11-28 LAB — COMPREHENSIVE METABOLIC PANEL
ALT: 118 U/L — ABNORMAL HIGH (ref 17–63)
AST: 67 U/L — AB (ref 15–41)
Albumin: 2.8 g/dL — ABNORMAL LOW (ref 3.5–5.0)
Alkaline Phosphatase: 189 U/L — ABNORMAL HIGH (ref 38–126)
Anion gap: 10 (ref 5–15)
BUN: 9 mg/dL (ref 6–20)
CHLORIDE: 104 mmol/L (ref 101–111)
CO2: 25 mmol/L (ref 22–32)
Calcium: 8.5 mg/dL — ABNORMAL LOW (ref 8.9–10.3)
Creatinine, Ser: 1.01 mg/dL (ref 0.61–1.24)
GFR calc Af Amer: >60 mL/min (ref >60)
Glucose, Bld: 90 mg/dL (ref 65–99)
POTASSIUM: 3.1 mmol/L — AB (ref 3.5–5.1)
SODIUM: 139 mmol/L (ref 135–145)
Total Bilirubin: 0.7 mg/dL (ref 0.3–1.2)
Total Protein: 5.9 g/dL — ABNORMAL LOW (ref 6.5–8.1)

## 2017-11-28 LAB — URINALYSIS, ROUTINE W REFLEX MICROSCOPIC
Bilirubin Urine: NEGATIVE
GLUCOSE, UA: NEGATIVE mg/dL
Hgb urine dipstick: NEGATIVE
Ketones, ur: NEGATIVE mg/dL
LEUKOCYTES UA: NEGATIVE
Nitrite: NEGATIVE
PH: 6 (ref 5.0–8.0)
Protein, ur: NEGATIVE mg/dL
Specific Gravity, Urine: 1.008 (ref 1.005–1.030)

## 2017-11-28 LAB — DIFFERENTIAL
Basophils Absolute: 0 10*3/uL (ref 0.0–0.1)
Basophils Relative: 0 %
Eosinophils Absolute: 0 10*3/uL (ref 0.0–0.7)
Eosinophils Relative: 1 %
LYMPHS PCT: 25 %
Lymphs Abs: 0.7 10*3/uL (ref 0.7–4.0)
MONO ABS: 0.4 10*3/uL (ref 0.1–1.0)
Monocytes Relative: 13 %
NEUTROS ABS: 1.6 10*3/uL — AB (ref 1.7–7.7)
Neutrophils Relative %: 61 %

## 2017-11-28 LAB — LIPASE, BLOOD: LIPASE: 80 U/L — AB (ref 11–51)

## 2017-11-28 MED ORDER — HYDROMORPHONE HCL 1 MG/ML IJ SOLN
1.0000 mg | Freq: Once | INTRAMUSCULAR | Status: AC
  Administered 2017-11-28: 1 mg via INTRAVENOUS
  Filled 2017-11-28: qty 1

## 2017-11-28 MED ORDER — BISACODYL 10 MG RE SUPP: 10.0000 mg | Freq: Every day | RECTAL | Status: DC | PRN

## 2017-11-28 MED ORDER — POTASSIUM CHLORIDE 20 MEQ/15ML (10%) PO SOLN
40.0000 meq | Freq: Once | ORAL | Status: AC
  Administered 2017-11-28: 40 meq via ORAL
  Filled 2017-11-28: qty 30

## 2017-11-28 MED ORDER — HYDRALAZINE HCL 20 MG/ML IJ SOLN: 10.0000 mg | INTRAMUSCULAR | Status: DC | PRN

## 2017-11-28 MED ORDER — ALLOPURINOL 100 MG PO TABS
100.0000 mg | ORAL_TABLET | Freq: Two times a day (BID) | ORAL | Status: DC
  Administered 2017-11-28 – 2017-12-02 (×8): 100 mg via ORAL
  Filled 2017-11-28 (×10): qty 1

## 2017-11-28 MED ORDER — POLYETHYLENE GLYCOL 3350 17 G PO PACK
17.0000 g | PACK | Freq: Every day | ORAL | Status: DC
  Administered 2017-11-29 – 2017-11-30 (×2): 17 g via ORAL
  Filled 2017-11-28 (×4): qty 1

## 2017-11-28 MED ORDER — ONDANSETRON HCL 4 MG/2ML IJ SOLN
4.0000 mg | Freq: Four times a day (QID) | INTRAMUSCULAR | Status: DC | PRN
  Administered 2017-11-29 – 2017-12-02 (×2): 4 mg via INTRAVENOUS
  Filled 2017-11-28 (×3): qty 2

## 2017-11-28 MED ORDER — ACETAMINOPHEN 650 MG RE SUPP: 650.0000 mg | Freq: Four times a day (QID) | RECTAL | Status: DC | PRN

## 2017-11-28 MED ORDER — FLEET ENEMA 7-19 GM/118ML RE ENEM: 1.0000 | ENEMA | Freq: Once | RECTAL | Status: DC | PRN

## 2017-11-28 MED ORDER — LACTATED RINGERS IV SOLN
INTRAVENOUS | Status: DC
  Administered 2017-11-28 – 2017-11-30 (×4): via INTRAVENOUS

## 2017-11-28 MED ORDER — ENOXAPARIN SODIUM 120 MG/0.8ML ~~LOC~~ SOLN
120.0000 mg | SUBCUTANEOUS | Status: DC
  Administered 2017-11-28 – 2017-12-01 (×4): 120 mg via SUBCUTANEOUS
  Filled 2017-11-28 (×4): qty 0.8

## 2017-11-28 MED ORDER — ONDANSETRON HCL 4 MG PO TABS: 4.0000 mg | ORAL_TABLET | Freq: Four times a day (QID) | ORAL | Status: DC | PRN

## 2017-11-28 MED ORDER — ACETAMINOPHEN 325 MG PO TABS
650.0000 mg | ORAL_TABLET | Freq: Four times a day (QID) | ORAL | Status: DC | PRN
  Administered 2017-11-29: 650 mg via ORAL
  Filled 2017-11-28: qty 2

## 2017-11-28 MED ORDER — HYDROMORPHONE HCL 2 MG/ML IJ SOLN
2.0000 mg | INTRAMUSCULAR | Status: DC | PRN
  Administered 2017-11-28 – 2017-11-29 (×6): 2 mg via INTRAVENOUS
  Filled 2017-11-28 (×7): qty 1

## 2017-11-28 NOTE — Plan of Care (Signed)
Family meeting scheduled for 3/15 at 10 AM. No charge

## 2017-11-28 NOTE — ED Triage Notes (Signed)
Pt c/o fo left sided flank pain starting 2 days ago.  Pt was just d/c 2 days ago with medication for pancreatitis but not relief as well as blood clots to both lower legs. Hx of kidney cancer.

## 2017-11-28 NOTE — H&P (Signed)
History and Physical    Jeffrey Trujillo XBD:[id number] DOB: 1951-02-10 DOA: 11/28/2017  PCP: [facility]   Patient coming from: Home  Chief Complaint: L flank pain  HPI: Jeffrey Trujillo is a 68 y.o. 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  He states that he felt well on the day of discharge, but yesterday afternoon at approximately 2 PM he experienced sudden onset of left-sided flank pain that was sharp in nature.  He tried taking his home narcotic medication oxycodone with no relief noted.  He denies any radiation of the pain or any particular aggravating or alleviating factors.  Due to poor pain control, he presented to the ED today for further evaluation and management. He denies any nausea or vomiting. He denies any hematuria, fever, or chills.   ED Course: Vital signs are noted to be stable and lab work demonstrates anemia and leukopenia that is stable from his prior readings.  He is noted to be hypokalemic with potassium 3.1.  LFTs are elevated with AST of 67 and ALT of 118.  CT of the abdomen for stone study does not demonstrate any nephrolithiasis or hydronephrosis and does appear to demonstrate a stable left-sided mass consistent with his renal cell carcinoma.  No other acute findings noted.  He has been given 1 mg of IV Dilaudid with little relief noted.  I have spoken with Dr. Walden Field of oncology who states that patient should be stable for admission here with IV fluid hydration and pain management.  Palliative care will also assist in pain management.  Review of Systems: All others reviewed and otherwise negative.  Past Medical History:  Diagnosis  Date  . Anal fistula   . Anemia in chronic renal disease   . Barrett's esophagus    by EGD 11/02/2009  . Chronic pain   . CKD (chronic kidney disease), stage III ([place])   . Diverticulosis of colon    by colonoscopy 11/02/2009  . DJD (degenerative joint disease) of cervical spine   . GERD (gastroesophageal reflux disease)   . Gouty arthritis    per pt on 05-16-2016 stable (last bout bilatearl wrist 03/ 2017)  . History of diverticulitis of colon    w/ small perforation , no surgical intervention 06/ 2014  . History of peptic ulcer disease   . Iron deficiency anemia due to chronic blood loss   . 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 Nocturia   . 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 Wears glasses     Past Surgical History:  Procedure Laterality Date  . COLONOSCOPY  Feb 2011   [name]: single anal papilla, pan-colonic diverticula  . COLONOSCOPY  09/25/2012   RMR: Colonic diverticulosis. Suspect diverticular bleeding-hepatic flexure tic-sealed  . ESOPHAGOGASTRODUODENOSCOPY  Feb 2011   [name]: salmon-colored epithelium consistent with Barrett's, nodular antral erosions, small hiatal hernia, negative H.pylori  .  ESOPHAGOGASTRODUODENOSCOPY  09/25/2012   XFG:[id number] esophagus. Small hiatal hernia. Antral erosions of doubtful clinical significance, no biopsies  . ESOPHAGOGASTRODUODENOSCOPY N/A 03/11/2013   SLF: Barrett's esophagus/Single ulcer in the gastric antrum-PROBABLE SOURCE FOR BLACK STOOL/ Chronic gastritis in the gastric antrum  . ESOPHAGOGASTRODUODENOSCOPY N/A 10/11/2014   Dr. [name] barrett's s/p biopsy. Hiatal hernia. Biopsy with intestinal metaplasia, glandular atypia  indefinite for low grade dysplasia noted as well  . ESOPHAGOGASTRODUODENOSCOPY N/A 01/19/2015   [name]: Barrett's no dysplasia  . EUS  08/07/2012   Procedure: UPPER ENDOSCOPIC ULTRASOUND (EUS) LINEAR;  Surgeon: [name], MD;  Location: [facility];  Service: Endoscopy;  Laterality: N/A;  . EUS  08/21/2012   Procedure: UPPER ENDOSCOPIC ULTRASOUND (EUS) LINEAR;  Surgeon: [name], MD;  Location: [facility] ENDOSCOPY;  Service: Endoscopy;  Laterality: N/A;  . INCISION AND DRAINAGE ABSCESS N/A 11/13/2015   Procedure: INCISION AND DRAINAGE ABSCESS;  Surgeon: Aviva Signs, MD;  Location: [facility];  Service: General;  Laterality: N/A;  . INGUINAL HERNIA REPAIR Bilateral 1990's  . LAPAROSCOPIC NEPHRECTOMY Right 06/27/2009   dr Alinda Money  . LUNG REMOVAL, PARTIAL  1970   left lower lobe, benign     reports that he quit smoking about 3 years ago. His smoking use included cigars. He has a 1.00 pack-year smoking history. His smokeless tobacco use includes chew. He reports that he drinks about 2.4 - 3.0 oz of alcohol per week. He reports that he does not use drugs.  Allergies  Allergen Reactions  . Amoxicillin Nausea And Vomiting  . Nsaids Other (See Comments)    Patient only has one kidney.   Marland Kitchen Zofran [Ondansetron Hcl] Hives  . Demerol [Meperidine] Rash    Family History  Problem Relation Age of Onset  . Lung cancer Brother   . Prostate cancer Brother   . Lumbar disc disease Brother   . Prostate cancer Brother   . Colon cancer Neg Hx     Prior to Admission medications   Medication Sig Start Date End Date Taking? Authorizing Provider  acetaminophen (TYLENOL) 500 MG tablet Take 500-1,000 mg by mouth every 6 (six) hours as needed for mild pain.    [provider]  allopurinol (ZYLOPRIM) 100 MG tablet Take 0.5 tablets (50 mg total) by mouth 2 (two) times daily. Patient taking differently: Take 100 mg by mouth 2 (two) times daily.  09/29/17   Carmin Muskrat, MD  enoxaparin (LOVENOX)  120 MG/0.8ML injection Inject 0.8 mLs (120 mg total) into the skin daily. 11/17/17   Ghimire, Henreitta Leber, MD  oxyCODONE (OXY IR/ROXICODONE) 5 MG immediate release tablet Take 1 tablet (5 mg total) by mouth every 6 (six) hours as needed for severe pain. 11/26/17   Isaac Bliss, Rayford Halsted, MD  predniSONE (DELTASONE) 10 MG tablet Take 1 tablet (10 mg total) by mouth daily with breakfast. Take 3 tablets today, 2 tomorrow and 1 the day after and then discontinue. 11/26/17   Erline Hau, MD    Physical Exam: Vitals:   11/28/17 1100 11/28/17 1130 11/28/17 1200 11/28/17 1230  BP: (!) 167/77 (!) 184/88 (!) 155/100 (!) 176/87  Pulse: 84 83 73 89  Resp: 16 18 20 18   Temp:      TempSrc:      SpO2: 97% 100% 95% 100%  Weight:      Height:        Constitutional: NAD, calm, fidgety and mildly uncomfortable Vitals:   11/28/17 1100 11/28/17 1130 11/28/17 1200  11/28/17 1230  BP: (!) 167/77 (!) 184/88 (!) 155/100 (!) 176/87  Pulse: 84 83 73 89  Resp: 16 18 20 18   Temp:      TempSrc:      SpO2: 97% 100% 95% 100%  Weight:      Height:       Eyes: lids and conjunctivae normal ENMT: Mucous membranes are moist.  Neck: normal, supple Respiratory: clear to auscultation bilaterally. Normal respiratory effort. No accessory muscle use.  Cardiovascular: Regular rate and rhythm, no murmurs. No extremity edema. Abdomen: no tenderness, no distention. Bowel sounds positive.  Musculoskeletal:  No joint deformity upper and lower extremities.   Skin: no rashes, lesions, ulcers.  Psychiatric: Normal judgment and insight. Alert and oriented x 3. Normal mood.   Labs on Admission: I have personally reviewed following labs and imaging studies  CBC: Recent Labs  Lab 11/22/17 0508 11/23/17 0409 11/25/17 0551 11/28/17 0846  WBC 3.1* 2.5* 2.2* 2.6*  NEUTROABS 2.4  --   --  1.6*  HGB 9.7* 8.5* 7.9* 9.6*  HCT 31.1* 26.8* 25.4* 30.9*  MCV 98.4 98.9 102.4* 99.4  PLT 111* 124* 155 902   Basic  Metabolic Panel: Recent Labs  Lab 11/22/17 0508 11/23/17 0409 11/28/17 0846  NA 141 137 139  K 3.4* 3.8 3.1*  CL 104 104 104  CO2 24 23 25   GLUCOSE 123* 84 90  BUN 13 9 9   CREATININE 1.25* 1.00 1.01  CALCIUM 9.3 8.3* 8.5*   GFR: Estimated Creatinine Clearance: 76.6 mL/min (by C-G formula based on SCr of 1.01 mg/dL). Liver Function Tests: Recent Labs  Lab 11/22/17 0508 11/23/17 0409 11/28/17 0846  AST 20 19 67*  ALT 18 13* 118*  ALKPHOS 83 70 189*  BILITOT 0.8 1.0 0.7  PROT 6.3* 5.7* 5.9*  ALBUMIN 2.8* 2.4* 2.8*   Recent Labs  Lab 11/22/17 0508 11/28/17 0846  LIPASE 835* 80*   No results for input(s): AMMONIA in the last 168 hours. Coagulation Profile: No results for input(s): INR, PROTIME in the last 168 hours. Cardiac Enzymes: No results for input(s): CKTOTAL, CKMB, CKMBINDEX, TROPONINI in the last 168 hours. BNP (last 3 results) No results for input(s): PROBNP in the last 8760 hours. HbA1C: No results for input(s): HGBA1C in the last 72 hours. CBG: No results for input(s): GLUCAP in the last 168 hours. Lipid Profile: No results for input(s): CHOL, HDL, LDLCALC, TRIG, CHOLHDL, LDLDIRECT in the last 72 hours. Thyroid Function Tests: No results for input(s): TSH, T4TOTAL, FREET4, T3FREE, THYROIDAB in the last 72 hours. Anemia Panel: No results for input(s): VITAMINB12, FOLATE, FERRITIN, TIBC, IRON, RETICCTPCT in the last 72 hours. Urine analysis:    Component Value Date/Time   COLORURINE YELLOW 11/28/2017 0825   APPEARANCEUR CLEAR 11/28/2017 0825   LABSPEC 1.008 11/28/2017 0825   PHURINE 6.0 11/28/2017 0825   GLUCOSEU NEGATIVE 11/28/2017 0825   HGBUR NEGATIVE 11/28/2017 0825   BILIRUBINUR NEGATIVE 11/28/2017 0825   KETONESUR NEGATIVE 11/28/2017 0825   PROTEINUR NEGATIVE 11/28/2017 0825   UROBILINOGEN 4.0 (H) 02/11/2015 1409   NITRITE NEGATIVE 11/28/2017 0825   LEUKOCYTESUR NEGATIVE 11/28/2017 0825    Radiological Exams on Admission: Ct Renal Stone  Study  Result Date: 11/28/2017 CLINICAL DATA:  Acute left flank pain. EXAM: CT ABDOMEN AND PELVIS WITHOUT CONTRAST TECHNIQUE: Multidetector CT imaging of the abdomen and pelvis was performed following the standard protocol without IV contrast. COMPARISON:  CT scan of November 22, 2017. FINDINGS: Lower chest: No acute abnormality. Hepatobiliary:  Minimal cholelithiasis may be present. No focal abnormality is seen in the liver on these unenhanced images. No biliary dilatation is noted. Pancreas: Grossly stable appearance and size of large mass seen in pancreatic body and smaller mass seen in pancreatic head, concerning for malignancy or metastatic disease. Spleen: Normal in size without focal abnormality. Adrenals/Urinary Tract: Adrenal glands are unremarkable. Status post right nephrectomy. Continued presence of large left renal mass consistent with renal cell carcinoma, with probable extension into the left renal vein. Urinary bladder is unremarkable. No hydronephrosis or renal obstruction is noted. No renal or ureteral calculi are no Stomach/Bowel: Stomach is within normal limits. Appendix appears normal. No evidence of bowel wall thickening, distention, or inflammatory changes. Sigmoid diverticulosis is noted without inflammation. Vascular/Lymphatic: No significant vascular findings are present. DVT described on prior exam cannot be assessed currently due to lack of intravenous contrast. No enlarged abdominal or pelvic lymph nodes. Reproductive: Prostate is unremarkable. Other: No abdominal wall hernia or abnormality. No abdominopelvic ascites. Musculoskeletal: No acute or significant osseous findings. IMPRESSION: Grossly stable appearance of large left renal mass is noted consistent with renal cell carcinoma, with probable extension into the left renal vein as described on prior exam. Status post right nephrectomy. No hydronephrosis or renal obstruction is noted. No renal or ureteral calculi are noted. Stable  appearance of pancreatic masses as described above, consistent with malignancy or metastatic disease. Probable minimal cholelithiasis without inflammation. Sigmoid diverticulosis without inflammation. Electronically Signed   By: Marijo Conception, M.D.   On: 11/28/2017 10:40    Assessment/Plan Principal Problem:   Renal cell cancer (HCC) Active Problems:   Anemia   Elevated LFTs   Hypokalemia   DVT, bilateral lower limbs ([place])    1. Left-sided flank pain in setting of renal cell carcinoma.  Place on IV fluid hydration as well as pain management with Dilaudid and I will increase to 2 mg IV every 3 hours for now until seen by palliative care.  Appreciate consultation from palliative care as well as oncology. 2. Elevated LFTs.  Monitor with repeat CMP in a.m.  Okay for regular diet. 3. Anemia.  Stable with no overt bleeding noted.  Continue to monitor. 4. Hypokalemia.  Replete with oral potassium and recheck a.m. labs. 5. Bilateral lower extremity DVTs. Continue on full dose Lovenox as he had been taking at home.   DVT prophylaxis: Full dose Lovenox Code Status: DNR Family Communication: Wife at bedside Disposition Plan:Home once pain controlled Consults called:Oncology Dr. Walden Field, Palliative Care Admission status: Obs, med-surg   Rodena Goldmann DO [facility] Pager [phone]  If 7PM-7AM, please contact night-coverage [email or web address] Password [facility]  11/28/2017, 12:57 PM

## 2017-11-28 NOTE — ED Notes (Signed)
Advised patient we needed urine specimen.  Patient used restroom while in lobby.  Left urinal in room.

## 2017-11-28 NOTE — ED Provider Notes (Signed)
[facility] EMERGENCY DEPARTMENT Provider Note   CSN: [id number] Arrival date & time: 11/28/17  2355     History   Chief Complaint Chief Complaint  Patient presents with  . Flank Pain    HPI Jeffrey Trujillo is a 67 y.o. male.  Patient complains of severe left flank pain.  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  His right kidney has been removed.  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  Flank Pain  This is a new problem. The current episode started yesterday. The problem occurs constantly. The problem has not changed since onset.Pertinent negatives include no chest pain, no abdominal pain and no headaches. Nothing aggravates the symptoms. Nothing relieves the symptoms.    Past Medical History:  Diagnosis Date  . Anal fistula   . Anemia in chronic renal disease   . Barrett's esophagus    by EGD 11/02/2009  . Chronic pain   . CKD (chronic kidney disease), stage III ([place])   . Diverticulosis of colon    by colonoscopy 11/02/2009  . DJD (degenerative joint disease) of cervical spine   . GERD (gastroesophageal reflux disease)   . Gouty arthritis    per pt on 05-16-2016 stable (last bout bilatearl wrist 03/ 2017)  . History of diverticulitis of colon    w/ small perforation , no surgical intervention 06/ 2014  . History of peptic ulcer disease   . Iron deficiency anemia due to chronic blood loss   . 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 Nocturia   . Thrombocytopenia ([place])    secondary to oral chemo  . Wears glasses     Patient Active Problem List   Diagnosis Date Noted  . Acute pancreatitis 11/22/2017  . DVT,  bilateral lower limbs ([place]) 11/15/2017  . History of renal cell cancer   . Chronic neck pain   . Goals of care, counseling/discussion   . Palliative care encounter   . Encephalopathy acute 11/15/2016  . Fever 12/07/2015  . Gouty arthritis 12/07/2015  . Fall   . Pyrexia   . Acute gout 11/15/2015  . Perirectal abscess 11/12/2015  . Hypokalemia 11/12/2015  . Anemia of chronic disease 11/12/2015  . Gallstones   . Sinus bradycardia 02/14/2015  . Malnutrition of moderate degree ([place]) 02/12/2015  . Elevated LFTs 02/12/2015  . Abdominal pain, right upper quadrant 02/12/2015  . Acute kidney injury ([place]) 02/12/2015  . Cholecystitis 02/11/2015  . Hiatal hernia   . Chest pain 04/15/2014  . PUD (peptic ulcer disease) 04/21/2013  . Melena 03/10/2013  . Abdominal pain 03/10/2013  . Leukopenia 03/10/2013  . Thrombocytopenia ([place]) 03/10/2013  . Abdominal abscess 03/10/2013  . Anemia 03/10/2013  . ETOH abuse 03/10/2013  . Bradycardia 03/10/2013  . Barrett's esophagus 01/04/2013  . Renal cell cancer ([place]) 09/25/2012  . Lower GI bleed 09/25/2012  . Anemia due to blood loss 09/25/2012  . Acute gout of right foot 09/25/2012  . DJD (degenerative joint disease) 09/25/2012  . Pancreatic mass 08/04/2012  . Acute strain of neck muscle 06/05/2011  . RECTAL BLEEDING 10/20/2009  .  NAUSEA WITH VOMITING 10/20/2009  . DIARRHEA, BLOODY 10/20/2009    Past Surgical History:  Procedure Laterality Date  . COLONOSCOPY  Feb 2011   [name]: single anal papilla, pan-colonic diverticula  . COLONOSCOPY  09/25/2012   RMR: Colonic diverticulosis. Suspect diverticular bleeding-hepatic flexure tic-sealed  . ESOPHAGOGASTRODUODENOSCOPY  Feb 2011   [name]: salmon-colored epithelium consistent with Barrett's, nodular antral erosions, small hiatal hernia, negative H.pylori  . ESOPHAGOGASTRODUODENOSCOPY  09/25/2012   OEV:[id number] esophagus. Small hiatal hernia. Antral erosions of doubtful clinical significance, no  biopsies  . ESOPHAGOGASTRODUODENOSCOPY N/A 03/11/2013   SLF: Barrett's esophagus/Single ulcer in the gastric antrum-PROBABLE SOURCE FOR BLACK STOOL/ Chronic gastritis in the gastric antrum  . ESOPHAGOGASTRODUODENOSCOPY N/A 10/11/2014   Dr. [name] barrett's s/p biopsy. Hiatal hernia. Biopsy with intestinal metaplasia, glandular atypia indefinite for low grade dysplasia noted as well  . ESOPHAGOGASTRODUODENOSCOPY N/A 01/19/2015   [name]: Barrett's no dysplasia  . EUS  08/07/2012   Procedure: UPPER ENDOSCOPIC ULTRASOUND (EUS) LINEAR;  Surgeon: [name], MD;  Location: [facility] ENDOSCOPY;  Service: Endoscopy;  Laterality: N/A;  . EUS  08/21/2012   Procedure: UPPER ENDOSCOPIC ULTRASOUND (EUS) LINEAR;  Surgeon: [name], MD;  Location: [facility] ENDOS[facility];  Service: Endoscopy;  Laterality: N/A;  . INCISION AND DRAINAGE ABSCESS N/A 11/13/2015   Procedure: INCISION AND DRAINAGE ABSCESS;  Surgeon: Aviva Signs, MD;  Location: [facility];  Service: General;  Laterality: N/A;  . INGUINAL HERNIA REPAIR Bilateral 1990's  . LAPAROSCOPIC NEPHRECTOMY Right 06/27/2009   dr Alinda Money  . LUNG REMOVAL, PARTIAL  1970   left lower lobe, benign       Home Medications    Prior to Admission medications   Medication Sig Start Date End Date Taking? Authorizing Provider  acetaminophen (TYLENOL) 500 MG tablet Take 500-1,000 mg by mouth every 6 (six) hours as needed for mild pain.    [provider]  allopurinol (ZYLOPRIM) 100 MG tablet Take 0.5 tablets (50 mg total) by mouth 2 (two) times daily. Patient taking differently: Take 100 mg by mouth 2 (two) times daily.  09/29/17   Carmin Muskrat, MD  enoxaparin (LOVENOX) 120 MG/0.8ML injection Inject 0.8 mLs (120 mg total) into the skin daily. 11/17/17   Ghimire, Henreitta Leber, MD  oxyCODONE (OXY IR/ROXICODONE) 5 MG immediate release tablet Take 1 tablet (5 mg total) by mouth every 6 (six) hours as needed for severe pain. 11/26/17   Isaac Bliss, Rayford Halsted, MD    predniSONE (DELTASONE) 10 MG tablet Take 1 tablet (10 mg total) by mouth daily with breakfast. Take 3 tablets today, 2 tomorrow and 1 the day after and then discontinue. 11/26/17   Erline Hau, MD    Family History Family History  Problem Relation Age of Onset  . Lung cancer Brother   . Prostate cancer Brother   . Lumbar disc disease Brother   . Prostate cancer Brother   . Colon cancer Neg Hx     Social History Social History   Tobacco Use  . Smoking status: Former Smoker    Packs/day: 0.25    Years: 4.00    Pack years: 1.00    Types: Cigars    Last attempt to quit: 05/16/2014    Years since quitting: 3.5  . Smokeless tobacco: Current User    Types: Chew  Substance Use Topics  . Alcohol use: Yes    Alcohol/week: 2.4 - 3.0 oz    Types: 2 - 3 Cans of beer, 2  Shots of liquor per week    Comment: binges intermittently, 1/5 vodka daily until it makes him sick, last ETOH was about 3 months ago  . Drug use: No     Allergies   Amoxicillin; Nsaids; Zofran [ondansetron hcl]; and Demerol [meperidine]   Review of Systems Review of Systems  Constitutional: Negative for appetite change and fatigue.  HENT: Negative for congestion, ear discharge and sinus pressure.   Eyes: Negative for discharge.  Respiratory: Negative for cough.   Cardiovascular: Negative for chest pain.  Gastrointestinal: Negative for abdominal pain and diarrhea.  Genitourinary: Positive for flank pain. Negative for frequency and hematuria.  Musculoskeletal: Negative for back pain.  Skin: Negative for rash.  Neurological: Negative for seizures and headaches.  Psychiatric/Behavioral: Negative for hallucinations.     Physical Exam Updated Vital Signs BP (!) 155/100   Pulse 73   Temp 98.1 F (36.7 C) (Oral)   Resp 20   Ht 5\' 11"  (1.803 m)   Wt 80.7 kg (178 lb)   SpO2 95%   BMI 24.83 kg/m   Physical Exam  Constitutional: He is oriented to person, place, and time. He appears  well-developed. He appears distressed.  HENT:  Head: Normocephalic.  Eyes: Conjunctivae and EOM are normal. No scleral icterus.  Neck: Neck supple. No thyromegaly present.  Cardiovascular: Normal rate and regular rhythm. Exam reveals no gallop and no friction rub.  No murmur heard. Pulmonary/Chest: No stridor. He has no wheezes. He has no rales. He exhibits no tenderness.  Abdominal: He exhibits no distension. There is no tenderness. There is no rebound.  Genitourinary:  Genitourinary Comments: Tender left flank  Musculoskeletal: Normal range of motion. He exhibits no edema.  Lymphadenopathy:    He has no cervical adenopathy.  Neurological: He is oriented to person, place, and time. He exhibits normal muscle tone. Coordination normal.  Skin: No rash noted. No erythema.  Psychiatric: He has a normal mood and affect. His behavior is normal.     ED Treatments / Results  Labs (all labs ordered are listed, but only abnormal results are displayed) Labs Reviewed  LIPASE, BLOOD - Abnormal; Notable for the following components:      Result Value   Lipase 80 (*)    All other components within normal limits  COMPREHENSIVE METABOLIC PANEL - Abnormal; Notable for the following components:   Potassium 3.1 (*)    Calcium 8.5 (*)    Total Protein 5.9 (*)    Albumin 2.8 (*)    AST 67 (*)    ALT 118 (*)    Alkaline Phosphatase 189 (*)    All other components within normal limits  CBC - Abnormal; Notable for the following components:   WBC 2.6 (*)    RBC 3.11 (*)    Hemoglobin 9.6 (*)    HCT 30.9 (*)    RDW 18.0 (*)    All other components within normal limits  DIFFERENTIAL - Abnormal; Notable for the following components:   Neutro Abs 1.6 (*)    All other components within normal limits  URINALYSIS, ROUTINE W REFLEX MICROSCOPIC    EKG  EKG Interpretation None       Radiology Ct Renal Stone Study  Result Date: 11/28/2017 CLINICAL DATA:  Acute left flank pain. EXAM: CT ABDOMEN  AND PELVIS WITHOUT CONTRAST TECHNIQUE: Multidetector CT imaging of the abdomen and pelvis was performed following the standard protocol without IV contrast. COMPARISON:  CT scan of November 22, 2017. FINDINGS: Lower chest:  No acute abnormality. Hepatobiliary: Minimal cholelithiasis may be present. No focal abnormality is seen in the liver on these unenhanced images. No biliary dilatation is noted. Pancreas: Grossly stable appearance and size of large mass seen in pancreatic body and smaller mass seen in pancreatic head, concerning for malignancy or metastatic disease. Spleen: Normal in size without focal abnormality. Adrenals/Urinary Tract: Adrenal glands are unremarkable. Status post right nephrectomy. Continued presence of large left renal mass consistent with renal cell carcinoma, with probable extension into the left renal vein. Urinary bladder is unremarkable. No hydronephrosis or renal obstruction is noted. No renal or ureteral calculi are no Stomach/Bowel: Stomach is within normal limits. Appendix appears normal. No evidence of bowel wall thickening, distention, or inflammatory changes. Sigmoid diverticulosis is noted without inflammation. Vascular/Lymphatic: No significant vascular findings are present. DVT described on prior exam cannot be assessed currently due to lack of intravenous contrast. No enlarged abdominal or pelvic lymph nodes. Reproductive: Prostate is unremarkable. Other: No abdominal wall hernia or abnormality. No abdominopelvic ascites. Musculoskeletal: No acute or significant osseous findings. IMPRESSION: Grossly stable appearance of large left renal mass is noted consistent with renal cell carcinoma, with probable extension into the left renal vein as described on prior exam. Status post right nephrectomy. No hydronephrosis or renal obstruction is noted. No renal or ureteral calculi are noted. Stable appearance of pancreatic masses as described above, consistent with malignancy or metastatic  disease. Probable minimal cholelithiasis without inflammation. Sigmoid diverticulosis without inflammation. Electronically Signed   By: Marijo Conception, M.D.   On: 11/28/2017 10:40    Procedures Procedures (including critical care time)  Medications Ordered in ED Medications  HYDROmorphone (DILAUDID) injection 1 mg (1 mg Intravenous Given 11/28/17 0857)  HYDROmorphone (DILAUDID) injection 1 mg (1 mg Intravenous Given 11/28/17 1149)     Initial Impression / Assessment and Plan / ED Course  I have reviewed the triage vital signs and the nursing notes.  Pertinent labs & imaging results that were available during my care of the patient were reviewed by me and considered in my medical decision making (see chart for details).     CT scan shows no change from recently for his renal cell cancer on the left and infiltration into his pancreas.  Labs show worsening of liver functions.  Patient will be admitted to medicine to help with pain control for his cancer  Final Clinical Impressions(s) / ED Diagnoses   Final diagnoses:  Flank pain    ED Discharge Orders    None       Milton Ferguson, MD 11/28/17 1231

## 2017-11-28 NOTE — Progress Notes (Signed)
S:  67 y.o. 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  He states that he felt well on the day of discharge, but yesterday afternoon at approximately 2 PM he experienced sudden onset of left-sided flank pain that was sharp in nature.  He tried taking his home narcotic medication oxycodone with no relief noted.  He also reports some constipation.   Due to poor pain control, he presented to the ED today for further evaluation and management.  He reports pain is slowly improving.    Review of Systems: All others reviewed and otherwise negative other than pain and constipation      Past Medical History:  Diagnosis Date  . Anal fistula   . Anemia in chronic renal disease   . Barrett's esophagus    by EGD 11/02/2009  . Chronic pain   . CKD (chronic kidney disease), stage III ([place])   . Diverticulosis of colon    by colonoscopy 11/02/2009  . DJD (degenerative joint disease) of cervical spine   . GERD (gastroesophageal reflux disease)   . Gouty arthritis    per pt on 05-16-2016 stable (last bout bilatearl wrist 03/ 2017)  . History of diverticulitis of colon    w/ small perforation , no surgical intervention 06/ 2014  . History of peptic ulcer disease   . Iron deficiency anemia due to chronic blood loss   . 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 Nocturia   . Thrombocytopenia ([place])    secondary to oral  chemo  . Wears glasses          Past Surgical History:  Procedure Laterality Date  . COLONOSCOPY  Feb 2011   [name]: single anal papilla, pan-colonic diverticula  . COLONOSCOPY  09/25/2012   RMR: Colonic diverticulosis. Suspect diverticular bleeding-hepatic flexure tic-sealed  . ESOPHAGOGASTRODUODENOSCOPY  Feb 2011   [name]: salmon-colored epithelium consistent with Barrett's, nodular antral erosions, small hiatal hernia, negative H.pylori  . ESOPHAGOGASTRODUODENOSCOPY  09/25/2012   FOY:DXAJOIN'O esophagus. Small hiatal hernia. Antral erosions of doubtful clinical significance, no biopsies  . ESOPHAGOGASTRODUODENOSCOPY N/A 03/11/2013   SLF: Barrett's esophagus/Single ulcer in the gastric antrum-PROBABLE SOURCE FOR BLACK STOOL/ Chronic gastritis in the gastric antrum  . ESOPHAGOGASTRODUODENOSCOPY N/A 10/11/2014   Dr. [name] barrett's s/p biopsy. Hiatal hernia. Biopsy with intestinal metaplasia, glandular atypia indefinite for low grade dysplasia noted as well  . ESOPHAGOGASTRODUODENOSCOPY N/A 01/19/2015   [name]: Barrett's no dysplasia  . EUS  08/07/2012   Procedure: UPPER ENDOSCOPIC ULTRASOUND (EUS) LINEAR;  Surgeon: [name], MD;  [facility];  Service: Endoscopy;  Laterality: N/A;  . EUS  08/21/2012   Procedure: UPPER ENDOSCOPIC ULTRASOUND (EUS) LINEAR;  Surgeon: [name], MD;  Location: [facility];  Service: Endoscopy;  Laterality: N/A;  . INCISION AND DRAINAGE ABSCESS N/A 11/13/2015   Procedure: INCISION AND DRAINAGE ABSCESS;  Surgeon: Aviva Signs, MD;  Location: [facility];  Service: General;  Laterality: N/A;  . INGUINAL HERNIA REPAIR Bilateral 1990's  . LAPAROSCOPIC NEPHRECTOMY Right 06/27/2009   dr Alinda Money  . LUNG REMOVAL, PARTIAL  1970   left lower lobe, benign     reports that he quit smoking about 3 years ago. His smoking use included cigars. He has a 1.00 pack-year smoking history. His smokeless tobacco use includes  chew. He reports that he drinks about 2.4 - 3.0 oz of alcohol per week. He reports that he does not use drugs.       Allergies  Allergen Reactions  . Amoxicillin Nausea And Vomiting  . Nsaids Other (See Comments)    Patient only has one kidney.   Marland Kitchen Zofran [Ondansetron Hcl] Hives  . Demerol [Meperidine] Rash         Family History  Problem Relation Age of Onset  . Lung cancer Brother   . Prostate cancer Brother   . Lumbar disc disease Brother   . Prostate cancer Brother   . Colon cancer Neg Hx            Prior to Admission medications   Medication Sig Start Date End Date Taking? Authorizing Provider  acetaminophen (TYLENOL) 500 MG tablet Take 500-1,000 mg by mouth every 6 (six) hours as needed for mild pain.    [provider]  allopurinol (ZYLOPRIM) 100 MG tablet Take 0.5 tablets (50 mg total) by mouth 2 (two) times daily. Patient taking differently: Take 100 mg by mouth 2 (two) times daily.  09/29/17   Carmin Muskrat, MD  enoxaparin (LOVENOX) 120 MG/0.8ML injection Inject 0.8 mLs (120 mg total) into the skin daily. 11/17/17   Ghimire, Henreitta Leber, MD  oxyCODONE (OXY IR/ROXICODONE) 5 MG immediate release tablet Take 1 tablet (5 mg total) by mouth every 6 (six) hours as needed for severe pain. 11/26/17   Isaac Bliss, Rayford Halsted, MD  predniSONE (DELTASONE) 10 MG tablet Take 1 tablet (10 mg total) by mouth daily with breakfast. Take 3 tablets today, 2 tomorrow and 1 the day after and then discontinue. 11/26/17   Isaac Bliss, Rayford Halsted, MD   Current medications: Tylenol allopurinol Lovenox Apresoline, Dilaudid, Zofran, Fleet's  Physical Exam:       Vitals:   11/28/17 1100 11/28/17 1130 11/28/17 1200 11/28/17 1230  BP: (!) 167/77 (!) 184/88 (!) 155/100 (!) 176/87  Pulse: 84 83 73 89  Resp: 16 18 20 18   Temp:      TempSrc:      SpO2: 97% 100% 95% 100%  Weight:      Height:        Constitutional: NAD, calm, fidgety and  mildly uncomfortable       Vitals:   11/28/17 1100 11/28/17 1130 11/28/17 1200 11/28/17 1230  BP: (!) 167/77 (!) 184/88 (!) 155/100 (!) 176/87  Pulse: 84 83 73 89  Resp: 16 18 20 18   Temp:      TempSrc:      SpO2: 97% 100% 95% 100%  Weight:      Height:       Eyes: lids and conjunctivae normal ENMT: Mucous membranes are moist.  Neck: normal, supple Respiratory: clear to auscultation bilaterally. Normal respiratory effort. No accessory muscle use.  Cardiovascular: Regular rate and rhythm, no murmurs. No extremity edema. Abdomen: Bowel sounds positive. Tender to palpation.  No guarding noted.   Musculoskeletal:  No joint deformity upper and lower extremities.   Skin: no rashes, lesions, ulcers.  Psychiatric: Normal judgment and insight. Alert and oriented  x 3. Normal mood.   Labs on Admission: I have personally reviewed following labs and imaging studies  CBC: LastLabs        Recent Labs  Lab 11/22/17 0508 11/23/17 0409 11/25/17 0551 11/28/17 0846  WBC 3.1* 2.5* 2.2* 2.6*  NEUTROABS 2.4  --   --  1.6*  HGB 9.7* 8.5* 7.9* 9.6*  HCT 31.1* 26.8* 25.4* 30.9*  MCV 98.4 98.9 102.4* 99.4  PLT 111* 124* 155 226     Basic Metabolic Panel: LastLabs       Recent Labs  Lab 11/22/17 0508 11/23/17 0409 11/28/17 0846  NA 141 137 139  K 3.4* 3.8 3.1*  CL 104 104 104  CO2 24 23 25   GLUCOSE 123* 84 90  BUN 13 9 9   CREATININE 1.25* 1.00 1.01  CALCIUM 9.3 8.3* 8.5*     GFR: Estimated Creatinine Clearance: 76.6 mL/min (by C-G formula based on SCr of 1.01 mg/dL). Liver Function Tests: LastLabs       Recent Labs  Lab 11/22/17 0508 11/23/17 0409 11/28/17 0846  AST 20 19 67*  ALT 18 13* 118*  ALKPHOS 83 70 189*  BILITOT 0.8 1.0 0.7  PROT 6.3* 5.7* 5.9*  ALBUMIN 2.8* 2.4* 2.8*     LastLabs      Recent Labs  Lab 11/22/17 0508 11/28/17 0846  LIPASE 835* 80*     UA negative.    CT abdomen done 11/22/2017:  IMPRESSION: 1. No  acute abnormality. 2. Unchanged left renal mass with invasion of the left renal vein. Unchanged pancreatic body and pancreatic head lesions, potentially metastasis. 3. DVT in the right common femoral bilateral superficial femoral veins, without significant progression from recent CT. 4. Mild colonic diverticulosis without diverticulitis. Mild splenomegaly   Radiological Exams on Admission:  ImagingResults(Last48hours)  Ct Renal Stone Study  Result Date: 11/28/2017 IMPRESSION: Grossly stable appearance of large left renal mass is noted consistent with renal cell carcinoma, with probable extension into the left renal vein as described on prior exam. Status post right nephrectomy. No hydronephrosis or renal obstruction is noted. No renal or ureteral calculi are noted. Stable appearance of pancreatic masses as described above, consistent with malignancy or metastatic disease. Probable minimal cholelithiasis without inflammation. Sigmoid diverticulosis without inflammation. Electronically Signed   By: Marijo Conception, M.D.   On: 11/28/2017 10:40     Assessment/Plan:  1.  Renal cell cancer ([place]).  Patient is a T1b right kidney cancer diagnosed in 2010.  He is status post nephrectomy June 27, 2009.  He has clear cell histology Fuhrman grade 3 of 4.  He is followed by [name].  He was previously  treated with Votrient 800 mg daily that was started in December 2013 and therapy was discontinued in June 2017 due to progression of disease.  He had a left kidney mass measuring 4.5 x 2.7 x 3.6 noted in November 2013.  He also had a pancreatic lesion biopsy proven December 2013 to be renal cell cancer.  He is currently on Cobometyx 40 mg daily that was started in July 2017.  Therapy has been held in the past due to perianal abscess.  We will continue to hold medication due to elevated liver function tests and continue to monitor labs.  2.  Pain.  He is currently on Dilaudid.  We will continue to  monitor for pain control during this hospitalization.  3.  Constipation.  He reports difficulty with bowel movements as an outpatient.  He is  currently on stool softeners and suppositories.  4.  Elevated liver function tests.  He had a stone study that showed overall stable findings.  He is currently on hydration and will continue to monitor chemistries.  5.  Bilateral lower extremity DVT.  He is on Lovenox will which we will continue during this hospitalization.

## 2017-11-29 DIAGNOSIS — E222 Syndrome of inappropriate secretion of antidiuretic hormone: Secondary | ICD-10-CM | POA: Diagnosis present

## 2017-11-29 DIAGNOSIS — I82423 Acute embolism and thrombosis of iliac vein, bilateral: Secondary | ICD-10-CM | POA: Diagnosis not present

## 2017-11-29 DIAGNOSIS — D631 Anemia in chronic kidney disease: Secondary | ICD-10-CM | POA: Diagnosis present

## 2017-11-29 DIAGNOSIS — Z66 Do not resuscitate: Secondary | ICD-10-CM | POA: Diagnosis present

## 2017-11-29 DIAGNOSIS — Z905 Acquired absence of kidney: Secondary | ICD-10-CM | POA: Diagnosis not present

## 2017-11-29 DIAGNOSIS — Z7189 Other specified counseling: Secondary | ICD-10-CM

## 2017-11-29 DIAGNOSIS — E876 Hypokalemia: Secondary | ICD-10-CM | POA: Diagnosis not present

## 2017-11-29 DIAGNOSIS — C642 Malignant neoplasm of left kidney, except renal pelvis: Principal | ICD-10-CM

## 2017-11-29 DIAGNOSIS — Z79899 Other long term (current) drug therapy: Secondary | ICD-10-CM | POA: Diagnosis not present

## 2017-11-29 DIAGNOSIS — N183 Chronic kidney disease, stage 3 (moderate): Secondary | ICD-10-CM | POA: Diagnosis present

## 2017-11-29 DIAGNOSIS — Z8711 Personal history of peptic ulcer disease: Secondary | ICD-10-CM | POA: Diagnosis not present

## 2017-11-29 DIAGNOSIS — Z7952 Long term (current) use of systemic steroids: Secondary | ICD-10-CM | POA: Diagnosis not present

## 2017-11-29 DIAGNOSIS — K219 Gastro-esophageal reflux disease without esophagitis: Secondary | ICD-10-CM | POA: Diagnosis present

## 2017-11-29 DIAGNOSIS — K8681 Exocrine pancreatic insufficiency: Secondary | ICD-10-CM | POA: Diagnosis present

## 2017-11-29 DIAGNOSIS — R109 Unspecified abdominal pain: Secondary | ICD-10-CM | POA: Diagnosis not present

## 2017-11-29 DIAGNOSIS — I82403 Acute embolism and thrombosis of unspecified deep veins of lower extremity, bilateral: Secondary | ICD-10-CM | POA: Diagnosis present

## 2017-11-29 DIAGNOSIS — M109 Gout, unspecified: Secondary | ICD-10-CM | POA: Diagnosis present

## 2017-11-29 DIAGNOSIS — Z9221 Personal history of antineoplastic chemotherapy: Secondary | ICD-10-CM | POA: Diagnosis not present

## 2017-11-29 DIAGNOSIS — G8929 Other chronic pain: Secondary | ICD-10-CM | POA: Diagnosis present

## 2017-11-29 DIAGNOSIS — F17220 Nicotine dependence, chewing tobacco, uncomplicated: Secondary | ICD-10-CM | POA: Diagnosis present

## 2017-11-29 DIAGNOSIS — Z515 Encounter for palliative care: Secondary | ICD-10-CM | POA: Diagnosis not present

## 2017-11-29 DIAGNOSIS — Z888 Allergy status to other drugs, medicaments and biological substances status: Secondary | ICD-10-CM | POA: Diagnosis not present

## 2017-11-29 DIAGNOSIS — K5900 Constipation, unspecified: Secondary | ICD-10-CM | POA: Diagnosis present

## 2017-11-29 LAB — COMPREHENSIVE METABOLIC PANEL
ALBUMIN: 2.8 g/dL — AB (ref 3.5–5.0)
ALT: 82 U/L — ABNORMAL HIGH (ref 17–63)
ANION GAP: 9 (ref 5–15)
AST: 30 U/L (ref 15–41)
Alkaline Phosphatase: 166 U/L — ABNORMAL HIGH (ref 38–126)
BUN: 8 mg/dL (ref 6–20)
CHLORIDE: 102 mmol/L (ref 101–111)
CO2: 26 mmol/L (ref 22–32)
Calcium: 8.2 mg/dL — ABNORMAL LOW (ref 8.9–10.3)
Creatinine, Ser: 1.04 mg/dL (ref 0.61–1.24)
GFR calc Af Amer: >60 mL/min (ref >60)
GFR calc non Af Amer: >60 mL/min (ref >60)
Glucose, Bld: 87 mg/dL (ref 65–99)
POTASSIUM: 3.5 mmol/L (ref 3.5–5.1)
SODIUM: 137 mmol/L (ref 135–145)
Total Bilirubin: 1.1 mg/dL (ref 0.3–1.2)
Total Protein: 6 g/dL — ABNORMAL LOW (ref 6.5–8.1)

## 2017-11-29 LAB — CBC
HEMATOCRIT: 30.9 % — AB (ref 39.0–52.0)
HEMOGLOBIN: 9.5 g/dL — AB (ref 13.0–17.0)
MCH: 30.5 pg (ref 26.0–34.0)
MCHC: 30.7 g/dL (ref 30.0–36.0)
MCV: 99.4 fL (ref 78.0–100.0)
Platelets: 229 10*3/uL (ref 150–400)
RBC: 3.11 MIL/uL — ABNORMAL LOW (ref 4.22–5.81)
RDW: 18.5 % — ABNORMAL HIGH (ref 11.5–15.5)
WBC: 2.7 10*3/uL — ABNORMAL LOW (ref 4.0–10.5)

## 2017-11-29 LAB — GLUCOSE, CAPILLARY: GLUCOSE-CAPILLARY: 100 mg/dL — AB (ref 65–99)

## 2017-11-29 LAB — URIC ACID: Uric Acid, Serum: 6.9 mg/dL (ref 4.4–7.6)

## 2017-11-29 LAB — MAGNESIUM: Magnesium: 1.6 mg/dL — ABNORMAL LOW (ref 1.7–2.4)

## 2017-11-29 MED ORDER — DIPHENHYDRAMINE HCL 50 MG/ML IJ SOLN
12.5000 mg | Freq: Four times a day (QID) | INTRAMUSCULAR | Status: DC | PRN
  Administered 2017-11-30: 12.5 mg via INTRAVENOUS
  Filled 2017-11-29: qty 1

## 2017-11-29 MED ORDER — HYDROMORPHONE HCL 1 MG/ML IJ SOLN
2.0000 mg | INTRAMUSCULAR | Status: DC
Start: 2017-11-29 — End: 2017-11-29

## 2017-11-29 MED ORDER — HYDROMORPHONE 1 MG/ML IV SOLN
INTRAVENOUS | Status: DC
  Administered 2017-11-29: 18:00:00 via INTRAVENOUS
  Administered 2017-11-30: 0 via INTRAVENOUS
  Administered 2017-11-30: 22:00:00 via INTRAVENOUS
  Filled 2017-11-29 (×3): qty 25

## 2017-11-29 MED ORDER — SENNOSIDES-DOCUSATE SODIUM 8.6-50 MG PO TABS
2.0000 | ORAL_TABLET | Freq: Two times a day (BID) | ORAL | Status: DC
  Administered 2017-11-29 – 2017-12-02 (×5): 2 via ORAL
  Filled 2017-11-29 (×6): qty 2

## 2017-11-29 MED ORDER — SODIUM CHLORIDE 0.9% FLUSH: 9.0000 mL | INTRAVENOUS | Status: DC | PRN

## 2017-11-29 MED ORDER — HYDROMORPHONE HCL 1 MG/ML IJ SOLN: 1.0000 mg | Freq: Once | INTRAMUSCULAR | Status: DC

## 2017-11-29 MED ORDER — HYDROMORPHONE HCL 1 MG/ML IJ SOLN
0.5000 mg | INTRAMUSCULAR | Status: DC | PRN
  Administered 2017-11-29 (×2): 0.5 mg via INTRAVENOUS
  Filled 2017-11-29 (×2): qty 0.5

## 2017-11-29 MED ORDER — MAGNESIUM SULFATE 2 GM/50ML IV SOLN
2.0000 g | Freq: Once | INTRAVENOUS | Status: AC
  Administered 2017-11-29: 2 g via INTRAVENOUS
  Filled 2017-11-29: qty 50

## 2017-11-29 MED ORDER — COLCHICINE 0.6 MG PO TABS: 0.6000 mg | ORAL_TABLET | Freq: Two times a day (BID) | ORAL | Status: DC

## 2017-11-29 MED ORDER — HYDROMORPHONE HCL 1 MG/ML IJ SOLN
2.0000 mg | Freq: Once | INTRAMUSCULAR | Status: AC
  Administered 2017-11-29: 2 mg via INTRAVENOUS
  Filled 2017-11-29: qty 2

## 2017-11-29 MED ORDER — HYDROMORPHONE HCL 1 MG/ML IJ SOLN: 1.0000 mg | Freq: Two times a day (BID) | INTRAMUSCULAR | Status: DC | PRN

## 2017-11-29 MED ORDER — COLCHICINE 0.6 MG PO TABS
1.2000 mg | ORAL_TABLET | Freq: Two times a day (BID) | ORAL | Status: DC
  Administered 2017-11-29 (×2): 1.2 mg via ORAL
  Filled 2017-11-29 (×2): qty 2

## 2017-11-29 MED ORDER — HYDROMORPHONE HCL 2 MG/ML IJ SOLN
2.0000 mg | INTRAMUSCULAR | Status: DC | PRN
Start: 2017-11-29 — End: 2017-11-29
  Administered 2017-11-29: 2 mg via INTRAVENOUS

## 2017-11-29 MED ORDER — DIPHENHYDRAMINE HCL 12.5 MG/5ML PO ELIX: 12.5000 mg | ORAL_SOLUTION | Freq: Four times a day (QID) | ORAL | Status: DC | PRN

## 2017-11-29 MED ORDER — NALOXONE HCL 0.4 MG/ML IJ SOLN: 0.4000 mg | INTRAMUSCULAR | Status: DC | PRN

## 2017-11-29 MED ORDER — HYDROMORPHONE HCL 1 MG/ML IJ SOLN
1.0000 mg | Freq: Once | INTRAMUSCULAR | Status: AC
  Administered 2017-11-29: 1 mg via INTRAVENOUS
  Filled 2017-11-29: qty 1

## 2017-11-29 NOTE — Care Management Obs Status (Signed)
[place] NOTIFICATION   Patient Details  Name: KEVION FATHEREE MRN: [id number] Date of Birth: Feb 25, 1951   Medicare Observation Status Notification Given:  Yes    Johari Bennetts, Chauncey [place], RN 11/29/2017, 9:35 AM

## 2017-11-29 NOTE — Progress Notes (Signed)
PROGRESS NOTE    Jeffrey Trujillo  FFM:[id number] DOB: 28-Apr-1951 DOA: 11/28/2017 PCP: [facility]   Brief Narrative:   Jeffrey Trujillo is a 67 y.o. 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  He has been admitted with left-sided flank pain that appears to be related to his renal cancer with possible extension.  He has been placed on IV fluid as well as IV pain medications with oncology and palliative care consulted for further evaluation.   Assessment & Plan:   Principal Problem:   Renal cell cancer ([place]) Active Problems:   Anemia   Elevated LFTs   Hypokalemia   DVT, bilateral lower limbs (HCC)   Renal cell carcinoma ([place])    1. Left-sided flank pain in setting of renal cell carcinoma.  Place on IV fluid hydration as well as pain management with Dilaudid and I will continue 2 mg IV every 3 hours for now until seen by palliative care.  Appreciate consultation from palliative care as well as oncology. 2. Suspected right upper extremity gout flare.  Check uric acid level and administer colchicine. 3. Elevated LFTs-improving.  Monitor with repeat CMP in a.m.  Improved this AM. Okay for regular diet. 4. Anemia.  Stable with no overt bleeding noted.  Continue to monitor. 5. Hypomagnesemia.  Replete with IV magnesium. 6. Bilateral lower extremity DVTs. Continue on full dose Lovenox as he had been taking at home.   DVT prophylaxis: Full dose Lovenox Code Status: DNR Family Communication: None Disposition Plan: Home once pain controlled and suspected gout flare controlled.   Consultants:   Oncology  Palliative Care  Procedures:    None  Antimicrobials:   None   Subjective: Patient seen and evaluated today and was noted to be yelling out in pain this morning due to his right hand hurting.  He denies any history of gout and states that this began early this morning.  He has only received 3-4 doses of IV Dilaudid overnight.  Objective: Vitals:   11/28/17 1452 11/28/17 2001 11/28/17 2109 11/29/17 0558  BP: (!) 166/78  (!) 152/66 140/64  Pulse: 91  90 87  Resp: 18  20 20   Temp: 98.1 F (36.7 C)  98.2 F (36.8 C) 98.5 F (36.9 C)  TempSrc: Oral  Oral Oral  SpO2: 100% 97% 98% 97%  Weight: 80.7 kg (178 lb)     Height: 5\' 11"  (1.803 m)       Intake/Output Summary (Last 24 hours) at 11/29/2017 1017 Last data filed at 11/29/2017 0829 Gross per 24 hour  Intake 1048.75 ml  Output 1000 ml  Net 48.75 ml   Filed Weights   11/28/17 0822 11/28/17 1452  Weight: 80.7 kg (178 lb) 80.7 kg (178 lb)    Examination:  General exam: Uncomfortable due to pain. Respiratory system: Clear to auscultation. Respiratory effort normal. Cardiovascular system: S1 & S2 heard, RRR. No JVD, murmurs, rubs, gallops or clicks. No pedal edema. Gastrointestinal system: Abdomen is nondistended, soft and nontender. No organomegaly or masses felt. Normal bowel sounds heard. Central nervous system: Alert and oriented. No focal neurological deficits. Extremities: Right hand swelling with extreme tenderness. Skin: No rashes, lesions or ulcers Psychiatry: Judgement and insight appear normal.  Mood & affect appropriate.     Data Reviewed: I have personally reviewed following labs and imaging studies  CBC: Recent Labs  Lab 11/23/17 0409 11/25/17 0551 11/28/17 0846 11/29/17 0514  WBC 2.5* 2.2* 2.6* 2.7*  NEUTROABS  --   --  1.6*  --   HGB 8.5* 7.9* 9.6* 9.5*  HCT 26.8* 25.4* 30.9* 30.9*  MCV 98.9 102.4* 99.4 99.4  PLT 124* 155 226 161   Basic Metabolic Panel: Recent Labs  Lab 11/23/17 0409 11/28/17 0846 11/29/17 0514  NA  137 139 137  K 3.8 3.1* 3.5  CL 104 104 102  CO2 23 25 26   GLUCOSE 84 90 87  BUN 9 9 8   CREATININE 1.00 1.01 1.04  CALCIUM 8.3* 8.5* 8.2*  MG  --   --  1.6*   GFR: Estimated Creatinine Clearance: 74.4 mL/min (by C-G formula based on SCr of 1.04 mg/dL). Liver Function Tests: Recent Labs  Lab 11/23/17 0409 11/28/17 0846 11/29/17 0514  AST 19 67* 30  ALT 13* 118* 82*  ALKPHOS 70 189* 166*  BILITOT 1.0 0.7 1.1  PROT 5.7* 5.9* 6.0*  ALBUMIN 2.4* 2.8* 2.8*   Recent Labs  Lab 11/28/17 0846  LIPASE 80*   No results for input(s): AMMONIA in the last 168 hours. Coagulation Profile: No results for input(s): INR, PROTIME in the last 168 hours. Cardiac Enzymes: No results for input(s): CKTOTAL, CKMB, CKMBINDEX, TROPONINI in the last 168 hours. BNP (last 3 results) No results for input(s): PROBNP in the last 8760 hours. HbA1C: No results for input(s): HGBA1C in the last 72 hours. CBG: Recent Labs  Lab 11/29/17 0729  GLUCAP 100*   Lipid Profile: No results for input(s): CHOL, HDL, LDLCALC, TRIG, CHOLHDL, LDLDIRECT in the last 72 hours. Thyroid Function Tests: No results for input(s): TSH, T4TOTAL, FREET4, T3FREE, THYROIDAB in the last 72 hours. Anemia Panel: No results for input(s): VITAMINB12, FOLATE, FERRITIN, TIBC, IRON, RETICCTPCT in the last 72 hours. Sepsis Labs: No results for input(s): PROCALCITON, LATICACIDVEN in the last 168 hours.  No results found for this or any previous visit (from the past 240 hour(s)).     Radiology Studies: Ct Renal Stone Study  Result Date: 11/28/2017 CLINICAL DATA:  Acute left flank pain. EXAM: CT ABDOMEN AND PELVIS WITHOUT CONTRAST TECHNIQUE: Multidetector CT imaging of the abdomen and pelvis was performed following the standard protocol without IV contrast. COMPARISON:  CT scan of November 22, 2017. FINDINGS: Lower chest: No acute abnormality. Hepatobiliary: Minimal cholelithiasis may be present. No focal abnormality is seen in the liver  on these unenhanced images. No biliary dilatation is noted. Pancreas: Grossly stable appearance and size of large mass seen in pancreatic body and smaller mass seen in pancreatic head, concerning for malignancy or metastatic disease. Spleen: Normal in size without focal abnormality. Adrenals/Urinary Tract: Adrenal glands are unremarkable. Status post right nephrectomy. Continued presence of large left renal mass consistent with renal cell carcinoma, with probable extension into the left renal vein. Urinary bladder is unremarkable. No hydronephrosis or renal obstruction is noted. No renal or ureteral calculi are no Stomach/Bowel: Stomach is within normal limits. Appendix appears normal. No evidence of bowel wall thickening, distention, or inflammatory changes. Sigmoid diverticulosis is noted without inflammation. Vascular/Lymphatic: No significant vascular findings are present. DVT described on prior exam cannot be assessed currently due to lack of intravenous contrast. No enlarged abdominal or pelvic lymph nodes. Reproductive: Prostate is unremarkable. Other: No abdominal wall hernia or abnormality. No abdominopelvic ascites.  Musculoskeletal: No acute or significant osseous findings. IMPRESSION: Grossly stable appearance of large left renal mass is noted consistent with renal cell carcinoma, with probable extension into the left renal vein as described on prior exam. Status post right nephrectomy. No hydronephrosis or renal obstruction is noted. No renal or ureteral calculi are noted. Stable appearance of pancreatic masses as described above, consistent with malignancy or metastatic disease. Probable minimal cholelithiasis without inflammation. Sigmoid diverticulosis without inflammation. Electronically Signed   By: Marijo Conception, M.D.   On: 11/28/2017 10:40        Scheduled Meds: . allopurinol  100 mg Oral BID  . colchicine  1.2 mg Oral BID  . enoxaparin  120 mg Subcutaneous Q24H  . polyethylene glycol   17 g Oral Daily   Continuous Infusions: . lactated ringers 75 mL/hr at 11/29/17 0507  . magnesium sulfate 1 - 4 g bolus IVPB       LOS: 0 days    Time spent: 30 minutes    Pratik Darleen Crocker, DO [facility] Pager [phone]  If 7PM-7AM, please contact night-coverage [email or web address] Password [facility] 11/29/2017, 10:17 AM

## 2017-11-29 NOTE — Consult Note (Addendum)
Consultation Note Date: 11/29/2017   Patient Name: Jeffrey Trujillo  DOB: Jan 07, 1951  MRN: [id number]  Age / Sex: 67 y.o., male  PCP: [facility] Referring Physician: Rodena Goldmann, DO  Reason for Consultation: Establishing goals of care, Pain control and Psychosocial/spiritual support  HPI/Patient Profile: 67 y.o. 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   Clinical Assessment and Goals of Care: [name] is resting in bed, he is moaning, crying out in pain.  Present today at bedside is his sister, Larena Glassman and her husband.  [name] is unable/unwilling to tell me what pain medication he takes at home.  After chart review it is noted that he takes oxycodone 5 mg immediate release tablets every 6 hours.  This is a total of 20 mg of oxycodone per day, with no breakthrough pain medication.  This regimen has been in adequate for the last few weeks.  He is  taking IV Dilaudid during this hospital admission.  20 mg of oxycodone by mouth is equal to 2.67 mg of hydromorphone IV.  Due to the severity of [name] pain, which is at a 10 out of 10 or greater most of the time, I have scheduled 2 mg of IV hydromorphone every 4 hours.  To be held by nursing staff for respiratory rate of 14 or less.  He also has an additional 1 mg of hydromorphone IV every hour as needed for breakthrough pain.  My worry is that [name] cancer is progressing, and that this extraordinary pain is a sign and symptom of worsening cancer, indicative of poor prognosis.  He is also suffering what seems to be a gout  attack in his right hand.  Sister Gilmore Laroche and I meet in the hallway.  I share a diagram of the chronic illness pathway, what is normal and expected.  I share my worry about progression of cancer, even without evidence and imaging studies.  Detail conference with nursing staff related to pain management.  I return later in the afternoon to evaluate [name]'s pain.  He is resting quietly in bed, asleep.  Wife [name] and daughter Olivia Mackie are at bedside.  We go into the hallway for meeting.  I again share a diagram of the chronic illness pathway, what is normal and expected.  I share my concern over [name] increased pain.  I share that we need 24-48 hours to control his pain and figure out what is next.  We briefly discussed hospice services.  Conference with Micheline Rough, MD related to pain management.  PCA with Dilaudid initiated for better pain control.  Healthcare power of attorney NEXT OF KIN -wife Jezreel Justiniano   SUMMARY OF RECOMMENDATIONS   24-48 hours for outcomes. Oncology consult for  further plan of care related to chemotherapy.  Code Status/Advance Care Planning:  DNR  Symptom Management:   Increased Dilaudid IV.  Added senna S for bowel regimen.  Palliative Prophylaxis:   Frequent Pain Assessment and Turn Reposition  Additional Recommendations (Limitations, Scope, Preferences):  Treat the treatable but no CPR, no intubation.  Psycho-social/Spiritual:   Desire for further Chaplaincy support:no  Additional Recommendations: Caregiving  Support/Resources and Education on Hospice  Prognosis:   < 6 months, or less would not be surprising based on frailty, functional status decreasing, severity of pain.  Discharge Planning: To be determined, based on outcomes.      Primary Diagnoses: Present on Admission: . Anemia . Elevated LFTs . Hypokalemia . Renal cell cancer ([place]) . DVT, bilateral lower limbs ([place]) . Renal cell carcinoma ([place])   I have reviewed  the medical record, interviewed the patient and family, and examined the patient. The following aspects are pertinent.  Past Medical History:  Diagnosis Date  . Anal fistula   . Anemia in chronic renal disease   . Barrett's esophagus    by EGD 11/02/2009  . Chronic pain   . CKD (chronic kidney disease), stage III ([place])   . Diverticulosis of colon    by colonoscopy 11/02/2009  . DJD (degenerative joint disease) of cervical spine   . GERD (gastroesophageal reflux disease)   . Gouty arthritis    per pt on 05-16-2016 stable (last bout bilatearl wrist 03/ 2017)  . History of diverticulitis of colon    w/ small perforation , no surgical intervention 06/ 2014  . History of peptic ulcer disease   . Iron deficiency anemia due to chronic blood loss   . Metastatic renal cell carcinoma [facility]) oncologist-  dr Alen Blew--  currently on oral chemo -- RCC w/ mets to pancreas slow progression per last ct 06/ 2017   dx 2010 right renal cell carcinoma (pT1b, Fuhrman grade 3/4) s/p  radical nephrectomy 06-27-2009/  dx left renal cell carcinoma w/ mets to pancreas 11/ 2013    . Nocturia   . 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 Wears glasses    Social History   Socioeconomic History  . Marital status: Married    Spouse name: None  . Number of children: None  . Years of education: None  . Highest education level: None  Social Needs  . Financial resource strain: None  . Food insecurity - worry: None  . Food insecurity - inability: None  . Transportation needs - medical: None  . Transportation needs - non-medical: None  Occupational History  . 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 Smoking status: Former Smoker    Packs/day: 0.25    Years: 4.00    Pack years: 1.00    Types: Cigars    Last attempt to quit: 05/16/2014    Years since quitting: 3.5  . Smokeless tobacco: Current User    Types: Chew  Substance  and Sexual Activity  . Alcohol use: Yes    Alcohol/week: 2.4 - 3.0 oz    Types: 2 - 3 Cans of beer, 2 Shots of liquor per week    Comment: binges intermittently, 1/5 vodka daily until it makes him sick, last ETOH was about 3 months ago  . Drug use: No  . Sexual activity: Yes  Other Topics Concern  . None  Social History Narrative  .  None   Family History  Problem Relation Age of Onset  . Lung cancer Brother   . Prostate cancer Brother   . Lumbar disc disease Brother   . Prostate cancer Brother   . Colon cancer Neg Hx    Scheduled Meds: . allopurinol  100 mg Oral BID  . colchicine  1.2 mg Oral BID  . enoxaparin  120 mg Subcutaneous Q24H  .  HYDROmorphone (DILAUDID) injection  2 mg Intravenous Q4H  . polyethylene glycol  17 g Oral Daily   Continuous Infusions: . lactated ringers 75 mL/hr at 11/29/17 0507  . magnesium sulfate 1 - 4 g bolus IVPB 2 g (11/29/17 1050)   PRN Meds:.acetaminophen **OR** acetaminophen, bisacodyl, hydrALAZINE, HYDROmorphone (DILAUDID) injection, ondansetron **OR** ondansetron (ZOFRAN) IV, sodium phosphate Medications Prior to Admission:  Prior to Admission medications   Medication Sig Start Date End Date Taking? Authorizing Provider  acetaminophen (TYLENOL) 500 MG tablet Take 500-1,000 mg by mouth every 6 (six) hours as needed for mild pain.   Yes [provider]  allopurinol (ZYLOPRIM) 100 MG tablet Take 0.5 tablets (50 mg total) by mouth 2 (two) times daily. Patient taking differently: Take 100 mg by mouth 2 (two) times daily.  09/29/17  Yes Carmin Muskrat, MD  enoxaparin (LOVENOX) 120 MG/0.8ML injection Inject 0.8 mLs (120 mg total) into the skin daily. 11/17/17  Yes Ghimire, Henreitta Leber, MD  oxyCODONE (OXY IR/ROXICODONE) 5 MG immediate release tablet Take 1 tablet (5 mg total) by mouth every 6 (six) hours as needed for severe pain. 11/26/17  Yes Erline Hau, MD   Allergies  Allergen Reactions  . Amoxicillin Nausea And Vomiting    . Nsaids Other (See Comments)    Patient only has one kidney.   Marland Kitchen Zofran [Ondansetron Hcl] Hives  . Demerol [Meperidine] Rash   Review of Systems  Unable to perform ROS: Acuity of condition    Physical Exam  Constitutional: He is oriented to person, place, and time. No distress.  Appears weak, chronically ill.  Makes but does not keeps eye contact  HENT:  Head: Atraumatic.  Cardiovascular: Normal rate.  Pulmonary/Chest: Effort normal. No respiratory distress.  Abdominal: Soft. He exhibits no distension.  Musculoskeletal: He exhibits no edema.  Neurological: He is alert and oriented to person, place, and time.  Skin: Skin is warm and dry.  Psychiatric:  Crying out, unable to participate adequately in conversations  Nursing note and vitals reviewed.   Vital Signs: BP 140/64 (BP Location: Left Arm)   Pulse 87   Temp 98.5 F (36.9 C) (Oral)   Resp 20   Ht 5\' 11"  (1.803 m)   Wt 80.7 kg (178 lb)   SpO2 97%   BMI 24.83 kg/m  Pain Assessment: 0-10   Pain Score: 10-Worst pain ever   SpO2: SpO2: 97 % O2 Device:SpO2: 97 % O2 Flow Rate: .   IO: Intake/output summary:   Intake/Output Summary (Last 24 hours) at 11/29/2017 1113 Last data filed at 11/29/2017 7893 Gross per 24 hour  Intake 1048.75 ml  Output 1000 ml  Net 48.75 ml    LBM: Last BM Date: 11/27/17 Baseline Weight: Weight: 80.7 kg (178 lb) Most recent weight: Weight: 80.7 kg (178 lb)     Palliative Assessment/Data:   Flowsheet Rows     Most Recent Value  Intake Tab  Referral Department  Hospitalist  Unit at Time of Referral  Med/Surg Unit  Palliative Care Primary Diagnosis  Cancer  Date Notified  11/28/17  Palliative Care Type  Return patient Palliative Care  Date of Admission  11/28/17  Date first seen by Palliative Care  11/28/17  # of days Palliative referral response time  0 Day(s)  # of days IP prior to Palliative referral  0  Clinical Assessment  Palliative Performance Scale Score  40%  Pain  Max last 24 hours  Not able to report  Pain Min Last 24 hours  Not able to report  Dyspnea Max Last 24 Hours  Not able to report  Dyspnea Min Last 24 hours  Not able to report  Psychosocial & Spiritual Assessment  Palliative Care Outcomes  Patient/Family meeting held?  Yes  Who was at the meeting?  patient at bedside  Palliative Care Outcomes  Provided psychosocial or spiritual support  Patient/Family wishes: Interventions discontinued/not started   Mechanical Ventilation      Time In:     1000  1240 Time Out:   1050  1300 Time Total: 50 + 20 = 70 minutes Greater than 50%  of this time was spent counseling and coordinating care related to the above assessment and plan.  Signed by: Drue Novel, NP   Please contact Palliative Medicine Team phone at [phone] for questions and concerns.  For individual provider: See Shea Evans

## 2017-11-30 LAB — MAGNESIUM: MAGNESIUM: 1.9 mg/dL (ref 1.7–2.4)

## 2017-11-30 LAB — COMPREHENSIVE METABOLIC PANEL
ALK PHOS: 118 U/L (ref 38–126)
ALT: 49 U/L (ref 17–63)
AST: 17 U/L (ref 15–41)
Albumin: 2.2 g/dL — ABNORMAL LOW (ref 3.5–5.0)
Anion gap: 8 (ref 5–15)
BILIRUBIN TOTAL: 1.4 mg/dL — AB (ref 0.3–1.2)
BUN: 13 mg/dL (ref 6–20)
CALCIUM: 7.9 mg/dL — AB (ref 8.9–10.3)
CO2: 26 mmol/L (ref 22–32)
Chloride: 98 mmol/L — ABNORMAL LOW (ref 101–111)
Creatinine, Ser: 1.25 mg/dL — ABNORMAL HIGH (ref 0.61–1.24)
GFR, EST NON AFRICAN AMERICAN: 58 mL/min — AB (ref >60)
Glucose, Bld: 101 mg/dL — ABNORMAL HIGH (ref 65–99)
Potassium: 4 mmol/L (ref 3.5–5.1)
Sodium: 132 mmol/L — ABNORMAL LOW (ref 135–145)
Total Protein: 5.2 g/dL — ABNORMAL LOW (ref 6.5–8.1)

## 2017-11-30 LAB — CBC
HCT: 26.9 % — ABNORMAL LOW (ref 39.0–52.0)
Hemoglobin: 8.4 g/dL — ABNORMAL LOW (ref 13.0–17.0)
MCH: 30.4 pg (ref 26.0–34.0)
MCHC: 31.2 g/dL (ref 30.0–36.0)
MCV: 97.5 fL (ref 78.0–100.0)
PLATELETS: 234 10*3/uL (ref 150–400)
RBC: 2.76 MIL/uL — AB (ref 4.22–5.81)
RDW: 18.6 % — ABNORMAL HIGH (ref 11.5–15.5)
WBC: 4.2 10*3/uL (ref 4.0–10.5)

## 2017-11-30 LAB — GLUCOSE, CAPILLARY
GLUCOSE-CAPILLARY: 121 mg/dL — AB (ref 65–99)
Glucose-Capillary: 82 mg/dL (ref 65–99)

## 2017-11-30 MED ORDER — METHYLPREDNISOLONE SODIUM SUCC 40 MG IJ SOLR
40.0000 mg | Freq: Two times a day (BID) | INTRAMUSCULAR | Status: DC
  Administered 2017-11-30 – 2017-12-01 (×3): 40 mg via INTRAVENOUS
  Filled 2017-11-30 (×2): qty 1

## 2017-11-30 NOTE — Progress Notes (Signed)
PROGRESS NOTE    Jeffrey Trujillo  [name]:[id number] DOB: Jul 20, 1951 DOA: 11/28/2017 PCP: [name]   Brief Narrative:   Jeffrey Trujillo is a 67 y.o. 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  He has been admitted with left-sided flank pain that appears to be related to his renal cancer with possible extension.  He has subsequently developed acute gout flare of his right upper extremity which has been causing him a great deal of pain.  Palliative care has been consulted with recommendations for Dilaudid PCA for pain control.   Assessment & Plan:   Principal Problem:   Renal cell cancer ([place]) Active Problems:   Anemia   Elevated LFTs   Hypokalemia   DVT, bilateral lower limbs (HCC)   Renal cell carcinoma ([place])   Palliative care by specialist    1. Left-sided flank pain in setting of renal cell carcinoma.    Appreciate oncology as well as palliative care consultation.  Continue pain management.  This appears to have improved. 2. Suspected right upper extremity gout flare.  Uric acid level within normal limits, but clinically this still appears to be an acute gout flare.  DC colchicine as patient is still having a great deal of pain and flare has not been aborted.  Initiate IV steroid today.  Hopefully can be weaned off Dilaudid PCA once steroid takes effect. 3. Elevated LFTs-improving.    Repeat CMP in a.m. 4. Mild hyponatremia.  This is likely secondary to SIADH effect from pain.  Will discontinue LR and place on fluid restriction for now and reevaluate in a.m. 5. Anemia.  Stable with no overt bleeding noted.  Continue to monitor. 6. Hypomagnesemia.   Normalized this a.m. 7. Bilateral lower extremity DVTs. Continue on full dose Lovenox as he had been taking at home.   DVT prophylaxis: Full dose Lovenox Code Status: DNR Family Communication: None Disposition Plan: Home once pain controlled and suspected gout flare controlled.   Consultants:   Oncology  Palliative Care  Procedures:   None  Antimicrobials:   None   Subjective: Patient seen and evaluated today and continues to have significant amounts of pain even with Dilaudid PCA.  His right hand is still inflamed and bothersome.  He states that the pain has been better managed, however with the PCA.  Objective: Vitals:   11/30/17 0400 11/30/17 0449 11/30/17 0451 11/30/17 0732  BP:   (!) 151/72   Pulse:   94   Resp: 20 (!) 21 16 12   Temp:   98.2 F (36.8 C)   TempSrc:   Oral   SpO2: 96% 98% 98% 100%  Weight:      Height:        Intake/Output Summary (Last 24 hours) at 11/30/2017 0937 Last data filed at 11/29/2017 2237 Gross per 24 hour  Intake 993.75 ml  Output 125 ml  Net 868.75 ml   Filed Weights   11/28/17 0822 11/28/17 1452  Weight: 80.7 kg (178 lb) 80.7 kg (178 lb)    Examination:  General exam: Uncomfortable due to pain. Respiratory system: Clear to auscultation. Respiratory effort normal. Cardiovascular system: S1 & S2 heard, RRR. No JVD, murmurs, rubs, gallops or clicks. No pedal edema.  Gastrointestinal system: Abdomen is nondistended, soft and nontender. No organomegaly or masses felt. Normal bowel sounds heard. Central nervous system: Alert and oriented. No focal neurological deficits. Extremities: Right hand swelling with extreme tenderness. Skin: No rashes, lesions or ulcers Psychiatry: Judgement and insight appear normal. Mood & affect appropriate.     Data Reviewed: I have personally reviewed following labs and imaging studies  CBC: Recent Labs  Lab 11/25/17 0551 11/28/17 0846 11/29/17 0514 11/30/17 0633  WBC 2.2* 2.6* 2.7* 4.2    NEUTROABS  --  1.6*  --   --   HGB 7.9* 9.6* 9.5* 8.4*  HCT 25.4* 30.9* 30.9* 26.9*  MCV 102.4* 99.4 99.4 97.5  PLT 155 226 229 235   Basic Metabolic Panel: Recent Labs  Lab 11/28/17 0846 11/29/17 0514 11/30/17 0633  NA 139 137 132*  K 3.1* 3.5 4.0  CL 104 102 98*  CO2 25 26 26   GLUCOSE 90 87 101*  BUN 9 8 13   CREATININE 1.01 1.04 1.25*  CALCIUM 8.5* 8.2* 7.9*  MG  --  1.6* 1.9   GFR: Estimated Creatinine Clearance: 61.9 mL/min (A) (by C-G formula based on SCr of 1.25 mg/dL (H)). Liver Function Tests: Recent Labs  Lab 11/28/17 0846 11/29/17 0514 11/30/17 0633  AST 67* 30 17  ALT 118* 82* 49  ALKPHOS 189* 166* 118  BILITOT 0.7 1.1 1.4*  PROT 5.9* 6.0* 5.2*  ALBUMIN 2.8* 2.8* 2.2*   Recent Labs  Lab 11/28/17 0846  LIPASE 80*   No results for input(s): AMMONIA in the last 168 hours. Coagulation Profile: No results for input(s): INR, PROTIME in the last 168 hours. Cardiac Enzymes: No results for input(s): CKTOTAL, CKMB, CKMBINDEX, TROPONINI in the last 168 hours. BNP (last 3 results) No results for input(s): PROBNP in the last 8760 hours. HbA1C: No results for input(s): HGBA1C in the last 72 hours. CBG: Recent Labs  Lab 11/29/17 0729 11/30/17 0756  GLUCAP 100* 82   Lipid Profile: No results for input(s): CHOL, HDL, LDLCALC, TRIG, CHOLHDL, LDLDIRECT in the last 72 hours. Thyroid Function Tests: No results for input(s): TSH, T4TOTAL, FREET4, T3FREE, THYROIDAB in the last 72 hours. Anemia Panel: No results for input(s): VITAMINB12, FOLATE, FERRITIN, TIBC, IRON, RETICCTPCT in the last 72 hours. Sepsis Labs: No results for input(s): PROCALCITON, LATICACIDVEN in the last 168 hours.  No results found for this or any previous visit (from the past 240 hour(s)).     Radiology Studies: Ct Renal Stone Study  Result Date: 11/28/2017 CLINICAL DATA:  Acute left flank pain. EXAM: CT ABDOMEN AND PELVIS WITHOUT CONTRAST TECHNIQUE: Multidetector CT imaging of the  abdomen and pelvis was performed following the standard protocol without IV contrast. COMPARISON:  CT scan of November 22, 2017. FINDINGS: Lower chest: No acute abnormality. Hepatobiliary: Minimal cholelithiasis may be present. No focal abnormality is seen in the liver on these unenhanced images. No biliary dilatation is noted. Pancreas: Grossly stable appearance and size of large mass seen in pancreatic body and smaller mass seen in pancreatic head, concerning for malignancy or metastatic disease. Spleen: Normal in size without focal abnormality. Adrenals/Urinary Tract: Adrenal glands are unremarkable. Status post right nephrectomy. Continued presence of large left renal mass consistent with renal cell carcinoma, with probable extension into the left renal vein. Urinary bladder is unremarkable. No hydronephrosis or renal obstruction is noted. No renal or ureteral calculi are no Stomach/Bowel: Stomach is within normal limits. Appendix appears normal. No evidence of bowel wall thickening, distention, or inflammatory  changes. Sigmoid diverticulosis is noted without inflammation. Vascular/Lymphatic: No significant vascular findings are present. DVT described on prior exam cannot be assessed currently due to lack of intravenous contrast. No enlarged abdominal or pelvic lymph nodes. Reproductive: Prostate is unremarkable. Other: No abdominal wall hernia or abnormality. No abdominopelvic ascites. Musculoskeletal: No acute or significant osseous findings. IMPRESSION: Grossly stable appearance of large left renal mass is noted consistent with renal cell carcinoma, with probable extension into the left renal vein as described on prior exam. Status post right nephrectomy. No hydronephrosis or renal obstruction is noted. No renal or ureteral calculi are noted. Stable appearance of pancreatic masses as described above, consistent with malignancy or metastatic disease. Probable minimal cholelithiasis without inflammation. Sigmoid  diverticulosis without inflammation. Electronically Signed   By: Marijo Conception, M.D.   On: 11/28/2017 10:40        Scheduled Meds: . allopurinol  100 mg Oral BID  . enoxaparin  120 mg Subcutaneous Q24H  . HYDROmorphone   Intravenous Q4H  . methylPREDNISolone (SOLU-MEDROL) injection  40 mg Intravenous Q12H  . polyethylene glycol  17 g Oral Daily  . senna-docusate  2 tablet Oral BID   Continuous Infusions: . lactated ringers 75 mL/hr at 11/30/17 0853     LOS: 1 day    Time spent: 30 minutes    Ermie Glendenning Darleen Crocker, DO [facility] Pager [phone]  If 7PM-7AM, please contact night-coverage [email or web address] Password [facility] 11/30/2017, 9:37 AM

## 2017-12-01 LAB — COMPREHENSIVE METABOLIC PANEL
ALK PHOS: 104 U/L (ref 38–126)
ALT: 40 U/L (ref 17–63)
AST: 16 U/L (ref 15–41)
Albumin: 2.3 g/dL — ABNORMAL LOW (ref 3.5–5.0)
Anion gap: 9 (ref 5–15)
BUN: 14 mg/dL (ref 6–20)
CALCIUM: 8.6 mg/dL — AB (ref 8.9–10.3)
CO2: 26 mmol/L (ref 22–32)
CREATININE: 1.13 mg/dL (ref 0.61–1.24)
Chloride: 103 mmol/L (ref 101–111)
GFR calc non Af Amer: >60 mL/min (ref >60)
GLUCOSE: 209 mg/dL — AB (ref 65–99)
Potassium: 3.8 mmol/L (ref 3.5–5.1)
SODIUM: 138 mmol/L (ref 135–145)
Total Bilirubin: 0.7 mg/dL (ref 0.3–1.2)
Total Protein: 5.7 g/dL — ABNORMAL LOW (ref 6.5–8.1)

## 2017-12-01 LAB — CBC
HCT: 24.9 % — ABNORMAL LOW (ref 39.0–52.0)
HEMOGLOBIN: 7.7 g/dL — AB (ref 13.0–17.0)
MCH: 30 pg (ref 26.0–34.0)
MCHC: 30.9 g/dL (ref 30.0–36.0)
MCV: 96.9 fL (ref 78.0–100.0)
Platelets: 221 10*3/uL (ref 150–400)
RBC: 2.57 MIL/uL — ABNORMAL LOW (ref 4.22–5.81)
RDW: 17.5 % — ABNORMAL HIGH (ref 11.5–15.5)
WBC: 3.6 10*3/uL — AB (ref 4.0–10.5)

## 2017-12-01 LAB — GLUCOSE, CAPILLARY: Glucose-Capillary: 177 mg/dL — ABNORMAL HIGH (ref 65–99)

## 2017-12-01 MED ORDER — HYDROMORPHONE HCL 1 MG/ML IJ SOLN
1.0000 mg | INTRAMUSCULAR | Status: DC | PRN
  Administered 2017-12-01 – 2017-12-02 (×6): 1 mg via INTRAVENOUS
  Filled 2017-12-01 (×6): qty 1

## 2017-12-01 MED ORDER — OXYCODONE HCL 5 MG PO TABS
5.0000 mg | ORAL_TABLET | Freq: Four times a day (QID) | ORAL | Status: DC
  Administered 2017-12-01 – 2017-12-02 (×5): 5 mg via ORAL
  Filled 2017-12-01 (×5): qty 1

## 2017-12-01 MED ORDER — METHYLPREDNISOLONE SODIUM SUCC 40 MG IJ SOLR
20.0000 mg | Freq: Two times a day (BID) | INTRAMUSCULAR | Status: DC
  Administered 2017-12-01 – 2017-12-02 (×2): 20 mg via INTRAVENOUS
  Filled 2017-12-01 (×2): qty 1

## 2017-12-01 MED ORDER — PANTOPRAZOLE SODIUM 40 MG PO TBEC
40.0000 mg | DELAYED_RELEASE_TABLET | Freq: Every day | ORAL | Status: DC
  Administered 2017-12-01 – 2017-12-02 (×2): 40 mg via ORAL
  Filled 2017-12-01 (×2): qty 1

## 2017-12-01 NOTE — Progress Notes (Signed)
PROGRESS NOTE    Jeffrey Trujillo  ERD:[id number] DOB: Jul 30, 1951 DOA: 11/28/2017 PCP: [facility]   Brief Narrative:   Jeffrey Trujillo is a 67 y.o. 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  He has been admitted with left-sided flank pain that appears to be related to his renal cancer with possible extension.  He has subsequently developed acute gout flare of his right upper extremity which has been causing him a great deal of pain.  Palliative care has been consulted with recommendations for Dilaudid PCA for pain control.   Assessment & Plan:   Principal Problem:   Renal cell cancer ([place]) Active Problems:   Anemia   Elevated LFTs   Hypokalemia   DVT, bilateral lower limbs (HCC)   Renal cell carcinoma ([place])   Palliative care by specialist    1. Left-sided flank pain in setting of renal cell carcinoma.    Appreciate oncology as well as palliative care consultation.  Continue pain management.  This appears to have improved and stabilized. 2. Suspected right upper extremity gout flare.  This has significantly improved with the use of IV steroid.  We will plan to wean to 20 mg twice daily starting tonight and discontinue Dilaudid PCA and place on as needed IV Dilaudid for breakthrough pain and scheduled home oxycodone for the time being.  Palliative to reassess in a.m. 3. Elevated LFTs-improving.    Resolved. 4. Mild hyponatremia-resolved.  This is likely secondary to SIADH effect from pain.  This has improved and is now within normal limits. 5. Anemia-worsening, but with no overt bleeding noted.  Continue to monitor.  Repeat CBC in a.m. 6. Bilateral  lower extremity DVTs. Continue on full dose Lovenox as he had been taking at home.   DVT prophylaxis: Full dose Lovenox Code Status: DNR Family Communication: None Disposition Plan: Home once pain controlled and suspected gout flare controlled.   Consultants:   Oncology  Palliative Care  Procedures:   None  Antimicrobials:   None   Subjective: Patient seen and evaluated today and is eating breakfast this morning and using his right arm.  He states that he slept well and his pain is overall improving with the use of IV steroids.  The swelling in his right arm has decreased as well.  He is amenable to discontinuing Dilaudid PCA at this time.  He does not complain of any further left-sided flank pain.  Objective: Vitals:   11/30/17 2224 12/01/17 0324 12/01/17 0600 12/01/17 0735  BP:   (!) 143/62   Pulse:   62   Resp: 12 14 12 18   Temp:   98.2 F (36.8 C)   TempSrc:   Oral   SpO2: 96% 98% 99% 93%  Weight:      Height:        Intake/Output Summary (Last 24 hours) at 12/01/2017 0906 Last data filed at 12/01/2017 0400 Gross per 24 hour  Intake -  Output 1250 ml  Net -1250 ml   Filed Weights   11/28/17 0822 11/28/17 1452  Weight: 80.7 kg (178 lb) 80.7 kg (178 lb)    Examination:  General exam: Uncomfortable due to pain. Respiratory system: Clear to auscultation. Respiratory effort normal. Cardiovascular  system: S1 & S2 heard, RRR. No JVD, murmurs, rubs, gallops or clicks. No pedal edema. Gastrointestinal system: Abdomen is nondistended, soft and nontender. No organomegaly or masses felt. Normal bowel sounds heard. Central nervous system: Alert and oriented. No focal neurological deficits. Extremities: Right hand swelling with extreme tenderness-improved. Skin: No rashes, lesions or ulcers Psychiatry: Judgement and insight appear normal. Mood & affect appropriate.     Data Reviewed: I have personally reviewed following labs and imaging studies  CBC: Recent  Labs  Lab 11/25/17 0551 11/28/17 0846 11/29/17 0514 11/30/17 0633 12/01/17 0620  WBC 2.2* 2.6* 2.7* 4.2 3.6*  NEUTROABS  --  1.6*  --   --   --   HGB 7.9* 9.6* 9.5* 8.4* 7.7*  HCT 25.4* 30.9* 30.9* 26.9* 24.9*  MCV 102.4* 99.4 99.4 97.5 96.9  PLT 155 226 229 234 673   Basic Metabolic Panel: Recent Labs  Lab 11/28/17 0846 11/29/17 0514 11/30/17 0633 12/01/17 0620  NA 139 137 132* 138  K 3.1* 3.5 4.0 3.8  CL 104 102 98* 103  CO2 25 26 26 26   GLUCOSE 90 87 101* 209*  BUN 9 8 13 14   CREATININE 1.01 1.04 1.25* 1.13  CALCIUM 8.5* 8.2* 7.9* 8.6*  MG  --  1.6* 1.9  --    GFR: Estimated Creatinine Clearance: 68.5 mL/min (by C-G formula based on SCr of 1.13 mg/dL). Liver Function Tests: Recent Labs  Lab 11/28/17 0846 11/29/17 0514 11/30/17 0633 12/01/17 0620  AST 67* 30 17 16   ALT 118* 82* 49 40  ALKPHOS 189* 166* 118 104  BILITOT 0.7 1.1 1.4* 0.7  PROT 5.9* 6.0* 5.2* 5.7*  ALBUMIN 2.8* 2.8* 2.2* 2.3*   Recent Labs  Lab 11/28/17 0846  LIPASE 80*   No results for input(s): AMMONIA in the last 168 hours. Coagulation Profile: No results for input(s): INR, PROTIME in the last 168 hours. Cardiac Enzymes: No results for input(s): CKTOTAL, CKMB, CKMBINDEX, TROPONINI in the last 168 hours. BNP (last 3 results) No results for input(s): PROBNP in the last 8760 hours. HbA1C: No results for input(s): HGBA1C in the last 72 hours. CBG: Recent Labs  Lab 11/29/17 0729 11/30/17 0756 11/30/17 1200 12/01/17 0734  GLUCAP 100* 82 121* 177*   Lipid Profile: No results for input(s): CHOL, HDL, LDLCALC, TRIG, CHOLHDL, LDLDIRECT in the last 72 hours. Thyroid Function Tests: No results for input(s): TSH, T4TOTAL, FREET4, T3FREE, THYROIDAB in the last 72 hours. Anemia Panel: No results for input(s): VITAMINB12, FOLATE, FERRITIN, TIBC, IRON, RETICCTPCT in the last 72 hours. Sepsis Labs: No results for input(s): PROCALCITON, LATICACIDVEN in the last 168 hours.  No results  found for this or any previous visit (from the past 240 hour(s)).     Radiology Studies: No results found.      Scheduled Meds: . allopurinol  100 mg Oral BID  . enoxaparin  120 mg Subcutaneous Q24H  . methylPREDNISolone (SOLU-MEDROL) injection  20 mg Intravenous Q12H  . oxyCODONE  5 mg Oral Q6H  . polyethylene glycol  17 g Oral Daily  . senna-docusate  2 tablet Oral BID   Continuous Infusions:    LOS: 2 days    Time spent: 30 minutes    Helina Hullum Darleen Crocker, DO [facility] Pager [phone]  If 7PM-7AM, please contact night-coverage [email or web address] Password [email or web address] 12/01/2017, 9:06 AM

## 2017-12-02 ENCOUNTER — Telehealth: Payer: Self-pay | Admitting: *Deleted

## 2017-12-02 DIAGNOSIS — R109 Unspecified abdominal pain: Secondary | ICD-10-CM

## 2017-12-02 DIAGNOSIS — Z7189 Other specified counseling: Secondary | ICD-10-CM

## 2017-12-02 LAB — CBC
HCT: 25.9 % — ABNORMAL LOW (ref 39.0–52.0)
Hemoglobin: 8.1 g/dL — ABNORMAL LOW (ref 13.0–17.0)
MCH: 30.7 pg (ref 26.0–34.0)
MCHC: 31.3 g/dL (ref 30.0–36.0)
MCV: 98.1 fL (ref 78.0–100.0)
Platelets: 240 10*3/uL (ref 150–400)
RBC: 2.64 MIL/uL — AB (ref 4.22–5.81)
RDW: 17.7 % — ABNORMAL HIGH (ref 11.5–15.5)
WBC: 4.2 10*3/uL (ref 4.0–10.5)

## 2017-12-02 LAB — BASIC METABOLIC PANEL
ANION GAP: 9 (ref 5–15)
BUN: 15 mg/dL (ref 6–20)
CHLORIDE: 103 mmol/L (ref 101–111)
CO2: 26 mmol/L (ref 22–32)
Calcium: 8.3 mg/dL — ABNORMAL LOW (ref 8.9–10.3)
Creatinine, Ser: 1.04 mg/dL (ref 0.61–1.24)
GFR calc non Af Amer: >60 mL/min (ref >60)
Glucose, Bld: 141 mg/dL — ABNORMAL HIGH (ref 65–99)
POTASSIUM: 3.8 mmol/L (ref 3.5–5.1)
SODIUM: 138 mmol/L (ref 135–145)

## 2017-12-02 LAB — GLUCOSE, CAPILLARY: GLUCOSE-CAPILLARY: 114 mg/dL — AB (ref 65–99)

## 2017-12-02 MED ORDER — PANTOPRAZOLE SODIUM 40 MG PO TBEC: 40.0000 mg | DELAYED_RELEASE_TABLET | Freq: Every day | ORAL | 0 refills | Status: AC

## 2017-12-02 MED ORDER — OXYCODONE HCL 5 MG PO TABS: 5.0000 mg | ORAL_TABLET | ORAL | 0 refills | Status: DC | PRN

## 2017-12-02 MED ORDER — OXYCODONE HCL ER 20 MG PO T12A: 20.0000 mg | EXTENDED_RELEASE_TABLET | Freq: Two times a day (BID) | ORAL | 0 refills | Status: DC

## 2017-12-02 MED ORDER — BISACODYL 10 MG RE SUPP: 10.0000 mg | Freq: Every day | RECTAL | 0 refills | Status: AC | PRN

## 2017-12-02 MED ORDER — PREDNISONE 20 MG PO TABS: ORAL_TABLET | ORAL | 0 refills | Status: AC

## 2017-12-02 NOTE — Discharge Summary (Signed)
Physician Discharge Summary  Jeffrey Trujillo JQZ:[id number] DOB: 09/02/1951 DOA: 11/28/2017  PCP: [facility] date: 11/28/2017  Discharge date: 12/02/2017  Admitted From:Home  Disposition:  Home  Recommendations for Outpatient Follow-up:  1. Follow up with PCP in 1-2 weeks 2. Follow-up with oncologist 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  He has been admitted with left-sided flank pain that appears to be related to his renal cancer with possible extension.  He has subsequently developed acute gout flare of his right upper extremity which has been causing him a great deal of pain.  Palliative care was consulted and had briefly placed patient on Dilaudid PCA for pain control.  He was also started on colchicine as well as IV steroids with significant improvement in his acute gout flare.  This morning his pain is much better and he is stable for discharge on narcotic medications as recommended below along with steroid taper.  He will have home evaluation with hospice care in the near future and this will be arranged by care management.  He is to follow-up with his oncologist [name] scheduled this upcoming Friday.   Discharge Diagnoses:  Principal Problem:   Renal cell cancer ([place]) Active Problems:   Anemia   Elevated LFTs   Hypokalemia   DVT, bilateral lower limbs (HCC)   Renal cell carcinoma ([place])   Palliative care  by specialist  1. Left-sided flank pain in setting of renal cell carcinoma.   Appreciate oncology as well as palliative care consultation.  Continue pain management.  This appears to have improved and stabilized. 2. Suspected right upper extremity gout flare.  This has significantly improved with the use of IV steroid.    Continue prednisone taper as prescribed.  Patient given narcotics as noted below for improved pain management.  Hospice evaluation at home. 3. Elevated LFTs-improving.   Resolved. 4. Mild hyponatremia-resolved.  This is likely secondary to SIADH effect from pain.  This has improved and is now within normal limits. 5. Anemia-stable with no overt bleeding noted. Continue to monitor in the outpatient setting. 6. Bilateral lower extremity DVTs. Continue on full dose Lovenoxas he had been taking at home.   Discharge Instructions  Discharge Instructions    Diet - low sodium heart healthy   Complete by:  As directed    Increase activity slowly   Complete by:  As directed      Allergies as of 12/02/2017      Reactions   Amoxicillin Nausea And Vomiting   Nsaids Other (See Comments)   Patient only has one kidney.    Zofran [ondansetron Hcl] Hives   Demerol [meperidine] Rash      Medication List    TAKE these medications   allopurinol 100 MG tablet Commonly known as:  ZYLOPRIM Take 0.5 tablets (50 mg total) by mouth 2 (two) times daily. What changed:  how much to take   bisacodyl 10 MG suppository Commonly known as:  DULCOLAX Place 1 suppository (10 mg total) rectally daily as needed for  moderate constipation.   enoxaparin 120 MG/0.8ML injection Commonly known as:  LOVENOX Inject 0.8 mLs (120 mg total) into the skin daily.   oxyCODONE 5 MG immediate release tablet Commonly known as:  Oxy IR/ROXICODONE Take 1 tablet (5 mg total) by mouth every 2 (two) hours as needed for breakthrough pain. What changed:    when to take this  reasons to take this    oxyCODONE 20 mg 12 hr tablet Commonly known as:  OXYCONTIN Take 1 tablet (20 mg total) by mouth every 12 (twelve) hours. What changed:  You were already taking a medication with the same name, and this prescription was added. Make sure you understand how and when to take each.   pantoprazole 40 MG tablet Commonly known as:  PROTONIX Take 1 tablet (40 mg total) by mouth daily. Start taking on:  12/03/2017   predniSONE 20 MG tablet Commonly known as:  DELTASONE Take 1 tablet (20 mg total) by mouth 2 (two) times daily with a meal for 1 day, THEN 0.5 tablets (10 mg total) 2 (two) times daily with a meal for 2 days, THEN 0.5 tablets (10 mg total) 2 (two) times daily with a meal for 2 days. Start taking on:  12/02/2017   TYLENOL 500 MG tablet Generic drug:  acetaminophen Take 500-1,000 mg by mouth every 6 (six) hours as needed for mild pain.      Follow-up Information    [facility] up in 1 week(s).   Contact information: [address] [place] [address] [phone]          Allergies  Allergen Reactions  . Amoxicillin Nausea And Vomiting  . Nsaids Other (See Comments)    Patient only has one kidney.   Marland Kitchen Zofran [Ondansetron Hcl] Hives  . Demerol [Meperidine] Rash    Consultations:  Oncology Dr. Walden Field  Palliative care   Procedures/Studies: Dg Chest 2 View  Result Date: 11/15/2017 CLINICAL DATA:  LOWER extremity edema. EXAM: CHEST  2 VIEW COMPARISON:  11/15/2016 and prior radiographs FINDINGS: Cardiomediastinal silhouette is unremarkable. LEFT basilar pleuroparenchymal scarring again noted. There is no evidence of focal airspace disease, pulmonary edema, suspicious pulmonary nodule/mass, pleural effusion, or pneumothorax. No acute bony abnormalities are identified. IMPRESSION: No evidence of acute cardiopulmonary disease. Electronically Signed   By: Margarette Canada M.D.   On: 11/15/2017 13:34   Ct Abdomen Pelvis W Contrast  Result Date:  11/22/2017 CLINICAL DATA:  Acute onset of periumbilical abdominal pain tonight. Patient on Lovenox for DVT. Renal cell carcinoma. EXAM: CT ABDOMEN AND PELVIS WITH CONTRAST TECHNIQUE: Multidetector CT imaging of the abdomen and pelvis was performed using the standard protocol following bolus administration of intravenous contrast. CONTRAST:  135mL ISOVUE-300 IOPAMIDOL (ISOVUE-300) INJECTION 61% COMPARISON:  CT angiography 11/15/2017 FINDINGS: Lower chest: Left pleural thickening.  No frank effusion. Hepatobiliary: 7 mm low-density lesion in the central dome of the liver, unchanged. No new hepatic lesion. Gallbladder physiologically distended, no calcified stone. No biliary dilatation. Pancreas: Low-density lesion in the mid pancreas measures 3.9 x 3.6 cm. Pancreatic head lesion measures 13 x 17 mm. No ductal dilatation or peripancreatic stranding. Spleen: Prominent size spanning 13.9 cm. Adrenals/Urinary Tract: No dominant adrenal nodule. Again seen heterogeneous enhancing left renal lesion measuring approximately 5.3 x 4 cm extending into the left renal vein. No evidence of hemorrhage. No hydronephrosis. Simple cyst arises from the upper kidney measuring 3.7 cm. Post right nephrectomy. Urinary bladder is physiologically distended. Stomach/Bowel: Small hiatal hernia.  Stomach physiologically distended. No bowel wall thickening, inflammatory change or obstruction. Normal appendix. Distal colonic diverticulosis without diverticulitis. Perianal air in the right tracking into the perineum is unchanged from the November 2018 CT. Vascular/Lymphatic: Filling defects within right common femoral and bilateral superficial femoral veins consistent with known DVT. No interval progression. No new vascular finding. Normal caliber abdominal aorta. Reproductive: Prostatic calcifications. Other: No ascites or free air.  No intra-abdominal abscess. Musculoskeletal: There are no acute or suspicious osseous abnormalities. IMPRESSION: 1.  No acute abnormality. 2. Unchanged left renal mass with invasion of the left renal vein. Unchanged pancreatic body and pancreatic head lesions, potentially metastasis. 3. DVT in the right common femoral bilateral superficial femoral veins, without significant progression from recent CT. 4. Mild colonic diverticulosis without diverticulitis. Mild splenomegaly. Electronically Signed   By: Jeb Levering M.D.   On: 11/22/2017 06:09   US Venous Img Lower Bilateral  Result Date: 11/15/2017 CLINICAL DATA:  Bilateral lower extremity edema for the past week. Former smoker. History of metastatic renal cell carcinoma. Evaluate for DVT. EXAM: BILATERAL LOWER EXTREMITY VENOUS DOPPLER ULTRASOUND TECHNIQUE: Gray-scale sonography with graded compression, as well as color Doppler and duplex ultrasound were performed to evaluate the lower extremity deep venous systems from the level of the common femoral vein and including the common femoral, femoral, profunda femoral, popliteal and calf veins including the posterior tibial, peroneal and gastrocnemius veins when visible. The superficial great saphenous vein was also interrogated. Spectral Doppler was utilized to evaluate flow at rest and with distal augmentation maneuvers in the common femoral, femoral and popliteal veins. COMPARISON:  None. FINDINGS: RIGHT LOWER EXTREMITY There is mixed echogenic occlusive DVT within the right common femoral vein (image 4), extending to the proximal (image 13), mid (image 17) and distal (image 21) aspects of the right femoral vein and the right popliteal vein (image 25). The right deep femoral (image 12) and tibial veins vein appears patent where imaged. The right saphenofemoral junction and greater saphenous veins appear patent where imaged. Other Findings:  None. LEFT LOWER EXTREMITY There is hypoechoic nonocclusive thrombus within the left common femoral vein. There is hypoechoic occlusive DVT seen throughout the interrogated course of the  left deep femoral vein (image 44), proximal (image 46), mid (image 51) and distal (image 55) aspects of the femoral vein, extending to involve the left popliteal vein (image 58) and imaged portions of the tibial veins. The left saphenofemoral junction and greater saphenous vein appear patent where imaged. Other Findings:  None. IMPRESSION: Examination is positive for extensive bilateral largely occlusive DVT as detailed above. Electronically Signed   By: Sandi Mariscal M.D.   On: 11/15/2017 14:21   Ct Renal Stone Study  Result Date: 11/28/2017 CLINICAL DATA:  Acute left flank pain. EXAM: CT ABDOMEN AND PELVIS WITHOUT CONTRAST TECHNIQUE: Multidetector CT imaging of the abdomen and pelvis was performed following the standard protocol without IV contrast. COMPARISON:  CT scan of November 22, 2017. FINDINGS: Lower chest: No acute abnormality. Hepatobiliary: Minimal cholelithiasis may be present. No focal abnormality is seen in the liver on these unenhanced images. No biliary dilatation is noted. Pancreas: Grossly stable appearance and size of large mass seen in pancreatic body and smaller mass seen in pancreatic head, concerning for malignancy or metastatic disease. Spleen: Normal in size without focal abnormality. Adrenals/Urinary Tract: Adrenal glands are unremarkable. Status post right nephrectomy. Continued presence of large left renal mass consistent with renal cell carcinoma, with probable extension into the left renal vein. Urinary  bladder is unremarkable. No hydronephrosis or renal obstruction is noted. No renal or ureteral calculi are no Stomach/Bowel: Stomach is within normal limits. Appendix appears normal. No evidence of bowel wall thickening, distention, or inflammatory changes. Sigmoid diverticulosis is noted without inflammation. Vascular/Lymphatic: No significant vascular findings are present. DVT described on prior exam cannot be assessed currently due to lack of intravenous contrast. No enlarged abdominal  or pelvic lymph nodes. Reproductive: Prostate is unremarkable. Other: No abdominal wall hernia or abnormality. No abdominopelvic ascites. Musculoskeletal: No acute or significant osseous findings. IMPRESSION: Grossly stable appearance of large left renal mass is noted consistent with renal cell carcinoma, with probable extension into the left renal vein as described on prior exam. Status post right nephrectomy. No hydronephrosis or renal obstruction is noted. No renal or ureteral calculi are noted. Stable appearance of pancreatic masses as described above, consistent with malignancy or metastatic disease. Probable minimal cholelithiasis without inflammation. Sigmoid diverticulosis without inflammation. Electronically Signed   By: Marijo Conception, M.D.   On: 11/28/2017 10:40   Ct Angio Abd/pel W/ And/or W/o  Result Date: 11/15/2017 CLINICAL DATA:  Portal vein thrombosis. EXAM: CTA ABDOMEN AND PELVIS wITHOUT AND WITH CONTRAST TECHNIQUE: Multidetector CT imaging of the abdomen and pelvis was performed using the standard protocol during bolus administration of intravenous contrast. Multiplanar reconstructed images and MIPs were obtained and reviewed to evaluate the vascular anatomy. CONTRAST:  117mL ISOVUE-300 IOPAMIDOL (ISOVUE-300) INJECTION 61% COMPARISON:  CT scan of July 30, 2017. FINDINGS: VASCULAR Aorta: Normal caliber aorta without aneurysm, dissection, vasculitis or significant stenosis. Celiac: Patent without evidence of aneurysm, dissection, vasculitis or significant stenosis. SMA: Patent without evidence of aneurysm, dissection, vasculitis or significant stenosis. Renals: Left renal artery appears normal. Status post right nephrectomy. IMA: Patent without evidence of aneurysm, dissection, vasculitis or significant stenosis. Inflow: Patent without evidence of aneurysm, dissection, vasculitis or significant stenosis. Proximal Outflow: Bilateral common femoral and visualized portions of the superficial  and profunda femoral arteries are patent without evidence of aneurysm, dissection, vasculitis or significant stenosis. Veins: Filling defects are noted in both superficial femoral veins, as well as right common femoral vein, concerning for deep venous thrombosis. Doppler study is recommended for further evaluation. There is no definite evidence of thrombus seen in the IVC. Tumor from left renal mass is seen extending into the left renal vein. The splenic, superior mesenteric and portal veins are widely patent. Review of the MIP images confirms the above findings. NON-VASCULAR Lower chest: No acute abnormality. Hepatobiliary: No focal liver abnormality is seen. No gallstones, gallbladder wall thickening, or biliary dilatation. Pancreas: Stable 3.4 cm rounded abnormality seen in pancreatic body concerning for possible malignancy. No ductal dilatation is noted. Spleen: Normal in size without focal abnormality. Adrenals/Urinary Tract: Status post right nephrectomy. Adrenal glands are unremarkable. 5.3 x 4.3 cm mass is seen involving upper pole of left kidney, with extension of tumor into the left renal vein as previously noted. No hydronephrosis or renal obstruction is noted. Stomach/Bowel: Stomach is within normal limits. Appendix appears normal. No evidence of bowel wall thickening, distention, or inflammatory changes. Lymphatic: No significant adenopathy is noted. Reproductive: Prostate is unremarkable. Other: No abdominal wall hernia or abnormality. No abdominopelvic ascites. Musculoskeletal: No acute or significant osseous findings. IMPRESSION: VASCULAR Thrombosis of both superficial femoral and right common femoral veins is noted consistent with deep venous thrombosis. The IVC, portal, superior mesenteric and splenic veins are widely patent. NON-VASCULAR 5.3 x 4.3 cm left renal mass is noted with extension of  tumor into the left renal vein. Electronically Signed   By: Marijo Conception, M.D.   On: 11/15/2017 21:11      Discharge Exam: Vitals:   12/01/17 2107 12/02/17 0505  BP: (!) 144/75 (!) 155/76  Pulse: 90 73  Resp: 18 18  Temp: 98.3 F (36.8 C) 97.6 F (36.4 C)  SpO2: 97% 100%   Vitals:   12/01/17 0735 12/01/17 1400 12/01/17 2107 12/02/17 0505  BP:  (!) 159/80 (!) 144/75 (!) 155/76  Pulse:  (!) 58 90 73  Resp: 18 18 18 18   Temp:  98 F (36.7 C) 98.3 F (36.8 C) 97.6 F (36.4 C)  TempSrc:  Oral Oral Oral  SpO2: 93% 97% 97% 100%  Weight:      Height:        General: Pt is alert, awake, not in acute distress Cardiovascular: RRR, S1/S2 +, no rubs, no gallops Respiratory: CTA bilaterally, no wheezing, no rhonchi Abdominal: Soft, NT, ND, bowel sounds + Extremities: no edema, no cyanosis; right arm swelling and erythema much improved.    The results of significant diagnostics from this hospitalization (including imaging, microbiology, ancillary and laboratory) are listed below for reference.     Microbiology: No results found for this or any previous visit (from the past 240 hour(s)).   Labs: BNP (last 3 results) Recent Labs    09/29/17 1227  BNP 59.7   Basic Metabolic Panel: Recent Labs  Lab 11/28/17 0846 11/29/17 0514 11/30/17 0633 12/01/17 0620 12/02/17 0555  NA 139 137 132* 138 138  K 3.1* 3.5 4.0 3.8 3.8  CL 104 102 98* 103 103  CO2 25 26 26 26 26   GLUCOSE 90 87 101* 209* 141*  BUN 9 8 13 14 15   CREATININE 1.01 1.04 1.25* 1.13 1.04  CALCIUM 8.5* 8.2* 7.9* 8.6* 8.3*  MG  --  1.6* 1.9  --   --    Liver Function Tests: Recent Labs  Lab 11/28/17 0846 11/29/17 0514 11/30/17 0633 12/01/17 0620  AST 67* 30 17 16   ALT 118* 82* 49 40  ALKPHOS 189* 166* 118 104  BILITOT 0.7 1.1 1.4* 0.7  PROT 5.9* 6.0* 5.2* 5.7*  ALBUMIN 2.8* 2.8* 2.2* 2.3*   Recent Labs  Lab 11/28/17 0846  LIPASE 80*   No results for input(s): AMMONIA in the last 168 hours. CBC: Recent Labs  Lab 11/28/17 0846 11/29/17 0514 11/30/17 0633 12/01/17 0620 12/02/17 0555  WBC  2.6* 2.7* 4.2 3.6* 4.2  NEUTROABS 1.6*  --   --   --   --   HGB 9.6* 9.5* 8.4* 7.7* 8.1*  HCT 30.9* 30.9* 26.9* 24.9* 25.9*  MCV 99.4 99.4 97.5 96.9 98.1  PLT 226 229 234 221 240   Cardiac Enzymes: No results for input(s): CKTOTAL, CKMB, CKMBINDEX, TROPONINI in the last 168 hours. BNP: Invalid input(s): POCBNP CBG: Recent Labs  Lab 11/29/17 0729 11/30/17 0756 11/30/17 1200 12/01/17 0734 12/02/17 0917  GLUCAP 100* 82 121* 177* 114*   D-Dimer No results for input(s): DDIMER in the last 72 hours. Hgb A1c No results for input(s): HGBA1C in the last 72 hours. Lipid Profile No results for input(s): CHOL, HDL, LDLCALC, TRIG, CHOLHDL, LDLDIRECT in the last 72 hours. Thyroid function studies No results for input(s): TSH, T4TOTAL, T3FREE, THYROIDAB in the last 72 hours.  Invalid input(s): FREET3 Anemia work up No results for input(s): VITAMINB12, FOLATE, FERRITIN, TIBC, IRON, RETICCTPCT in the last 72 hours. Urinalysis    Component Value  Date/Time   COLORURINE YELLOW 11/28/2017 0825   APPEARANCEUR CLEAR 11/28/2017 0825   LABSPEC 1.008 11/28/2017 0825   PHURINE 6.0 11/28/2017 0825   GLUCOSEU NEGATIVE 11/28/2017 0825   HGBUR NEGATIVE 11/28/2017 0825   BILIRUBINUR NEGATIVE 11/28/2017 0825   KETONESUR NEGATIVE 11/28/2017 0825   PROTEINUR NEGATIVE 11/28/2017 0825   UROBILINOGEN 4.0 (H) 02/11/2015 1409   NITRITE NEGATIVE 11/28/2017 0825   LEUKOCYTESUR NEGATIVE 11/28/2017 0825   Sepsis Labs Invalid input(s): PROCALCITONIN,  WBC,  LACTICIDVEN Microbiology No results found for this or any previous visit (from the past 240 hour(s)).   Time coordinating discharge: Over 30 minutes  SIGNED:   Rodena Goldmann, DO [facility] 12/02/2017, 11:49 AM Pager [phone]  If 7PM-7AM, please contact night-coverage [email or web address] Password [email or web address]

## 2017-12-02 NOTE — Care Management Important Message (Signed)
Important Message  Patient Details  Name: Jeffrey Trujillo MRN: [id number] Date of Birth: 11-02-1950   Medicare Important Message Given:  Yes    Hans Rusher, Chauncey Reading, RN 12/02/2017, 12:30 PM

## 2017-12-02 NOTE — Care Management (Addendum)
At request of Palliative NP, patient's information sent to [facility] for an in-home evaluation after patient discharges home. Patient will DC home today 12/02/2017.

## 2017-12-02 NOTE — Progress Notes (Signed)
Daily Progress Note   Patient Name: Jeffrey Trujillo       Date: 12/02/2017 DOB: 19-Feb-1951  Age: 67 y.o. MRN#: [id number] Attending Physician: Rodena Goldmann, DO Primary Care Physician: [facility] Date: 11/28/2017  Reason for Consultation/Follow-up: Establishing goals of care, Hospice Evaluation, Pain control and Psychosocial/spiritual support  Subjective: Jeffrey Trujillo is resting quietly in bed.  He greets me making and keeping eye contact.  He tells me that his pain has been relatively well controlled over the last few days.  He is not experiencing pain in his abdomen or flank, only in his right arm which is swollen, but improving.  We talked about his cancer, and follow-ups.  Jeffrey Trujillo states that he will see his oncologist, [name] on Friday at 10 AM.  He tells me that he is taking a medication that he gets by mail, as a chemo agent.  We talked about discussing the benefits of this medication with [name], asking if he still recommends continuation of this medication.    We talked about pain management.  I ask who writes his pain prescriptions.  Jeffrey Trujillo states that he does not have anyone outside of the hospital to write pain management for him.  He tells me that [name] will not write narcotics for him.  We talked about the benefits of hospice including psychosocial support, pain management.  Jeffrey Trujillo states that he expects his pain to increase as time goes further.  He seems at this point very realistic about his stage IV cancer.  Jeffrey Trujillo is agreeable to have visit from [facility] at at his home after discharge.  Pain management suggestions.  [name] has used 5 mg of IV Dilaudid in the last 24 hours.  This equals 50 mg of  oxycodone.  Recommendations include:   ER OxyContin 20 mg by mouth every 12 hours   oxycodone 5 mg by mouth every 2 hours as needed breakthrough pain.   Referral to [facility] for in-home evaluation after discharge.    Length of Stay: 3  Current Medications: Scheduled Meds:  . allopurinol  100 mg Oral BID  . enoxaparin  120 mg Subcutaneous Q24H  . methylPREDNISolone (SOLU-MEDROL) injection  20 mg Intravenous Q12H  . oxyCODONE  5 mg  Oral Q6H  . pantoprazole  40 mg Oral Daily  . polyethylene glycol  17 g Oral Daily  . senna-docusate  2 tablet Oral BID    Continuous Infusions:   PRN Meds: acetaminophen **OR** acetaminophen, bisacodyl, hydrALAZINE, HYDROmorphone (DILAUDID) injection, ondansetron **OR** ondansetron (ZOFRAN) IV, sodium phosphate  Physical Exam  Constitutional: He is oriented to person, place, and time. No distress.  Makes and keeps eye contact, appears in stable health  HENT:  Head: Normocephalic and atraumatic.  Cardiovascular: Normal rate.  Pulmonary/Chest: Effort normal. No respiratory distress.  Abdominal: Soft. He exhibits no distension.  Musculoskeletal:  Edema in right arm, mild edema BLLE  Neurological: He is alert and oriented to person, place, and time.  Skin: Skin is warm and dry.  Psychiatric:  Calm and cooperative  Nursing note and vitals reviewed.           Vital Signs: BP (!) 155/76 (BP Location: Left Arm)   Pulse 73   Temp 97.6 F (36.4 C) (Oral)   Resp 18   Ht 5\' 11"  (1.803 m)   Wt 80.7 kg (178 lb)   SpO2 100%   BMI 24.83 kg/m  SpO2: SpO2: 100 % O2 Device: O2 Device: Room Air O2 Flow Rate: O2 Flow Rate (L/min): 2 L/min  Intake/output summary:   Intake/Output Summary (Last 24 hours) at 12/02/2017 1115 Last data filed at 12/02/2017 0546 Gross per 24 hour  Intake 840 ml  Output 1050 ml  Net -210 ml   LBM: Last BM Date: 12/02/17 Baseline Weight: Weight: 80.7 kg (178 lb) Most recent weight: Weight: 80.7 kg  (178 lb)       Palliative Assessment/Data:    Flowsheet Rows     Most Recent Value  Intake Tab  Referral Department  Hospitalist  Unit at Time of Referral  Med/Surg Unit  Palliative Care Primary Diagnosis  Cancer  Date Notified  11/28/17  Palliative Care Type  Return patient Palliative Care  Date of Admission  11/28/17  Date first seen by Palliative Care  11/28/17  # of days Palliative referral response time  0 Day(s)  # of days IP prior to Palliative referral  0  Clinical Assessment  Palliative Performance Scale Score  40%  Pain Max last 24 hours  Not able to report  Pain Min Last 24 hours  Not able to report  Dyspnea Max Last 24 Hours  Not able to report  Dyspnea Min Last 24 hours  Not able to report  Psychosocial & Spiritual Assessment  Palliative Care Outcomes  Patient/Family meeting held?  Yes  Who was at the meeting?  patient at bedside  Palliative Care Outcomes  Provided psychosocial or spiritual support  Patient/Family wishes: Interventions discontinued/not started   Mechanical Ventilation      Patient Active Problem List   Diagnosis Date Noted  . Palliative care by specialist   . Renal cell carcinoma ([place]) 11/28/2017  . Acute pancreatitis 11/22/2017  . DVT, bilateral lower limbs ([place]) 11/15/2017  . History of renal cell cancer   . Chronic neck pain   . Goals of care, counseling/discussion   . Palliative care encounter   . Encephalopathy acute 11/15/2016  . Fever 12/07/2015  . Gouty arthritis 12/07/2015  . Fall   . Pyrexia   . Acute gout 11/15/2015  . Perirectal abscess 11/12/2015  . Hypokalemia 11/12/2015  . Anemia of chronic disease 11/12/2015  . Gallstones   . Sinus bradycardia 02/14/2015  . Malnutrition of moderate degree ([facility]) 02/12/2015  .  Elevated LFTs 02/12/2015  . Abdominal pain, right upper quadrant 02/12/2015  . Acute kidney injury ([place]) 02/12/2015  . Cholecystitis 02/11/2015  . Hiatal hernia   . Chest pain 04/15/2014  . PUD (peptic  ulcer disease) 04/21/2013  . Melena 03/10/2013  . Abdominal pain 03/10/2013  . Leukopenia 03/10/2013  . Thrombocytopenia ([place]) 03/10/2013  . Abdominal abscess 03/10/2013  . Anemia 03/10/2013  . ETOH abuse 03/10/2013  . Bradycardia 03/10/2013  . Barrett's esophagus 01/04/2013  . Renal cell cancer ([place]) 09/25/2012  . Lower GI bleed 09/25/2012  . Anemia due to blood loss 09/25/2012  . Acute gout of right foot 09/25/2012  . DJD (degenerative joint disease) 09/25/2012  . Pancreatic mass 08/04/2012  . Acute strain of neck muscle 06/05/2011  . RECTAL BLEEDING 10/20/2009  . NAUSEA WITH VOMITING 10/20/2009  . DIARRHEA, BLOODY 10/20/2009    Palliative Care Assessment & Plan   Patient Profile: 67 y.o. 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  Assessment: Left-sided flank pain in setting of renal cell carcinoma: Mr. Baird and takes oral chemo agent prescribed by his trusted oncologist, [name].  He tells me he has a follow-up this Friday at 10 AM.  We talked about the benefits of in-home hospice due to his stage IV metastatic cancer.  We talked about the benefits of pain management, psychosocial support.  Mr. Antonacci is agreeable for outpatient hospice evaluation with [facility].  I encouraged him to discuss the use of the oral chemo agent with [name], as hospice will not accept him as a patient as long as he continues the oral chemo agent.  Recommendations/Plan:  Pain management regimen recommended as;  ER OxyContin 20 mg by mouth every 12 hours,   oxycodone 5 mg by mouth every 2 hours as needed breakthrough pain.  Referral to [facility] for  in-home evaluation after discharge.  Goals of Care and Additional Recommendations:  Limitations on Scope of Treatment: Continue to treat the treatable but no CPR, no intubation.   considering continuation of chemo pills  Code Status:    Code Status Orders  (From admission, onward)        Start     Ordered   11/28/17 1311  Do not attempt resuscitation (DNR)  Continuous    Question Answer Comment  In the event of cardiac or respiratory ARREST Do not call a "code blue"   In the event of cardiac or respiratory ARREST Do not perform Intubation, CPR, defibrillation or ACLS   In the event of cardiac or respiratory ARREST Use medication by any route, position, wound care, and other measures to relive pain and suffering. May use oxygen, suction and manual treatment of airway obstruction as needed for comfort.      11/28/17 1314    Code Status History    Date Active Date Inactive Code Status Order ID Comments User Context   11/22/2017 12:43 11/26/2017 16:02 Full Code [id number]  Erline Hau, MD Inpatient   11/15/2017 20:26 11/17/2017 15:34 DNR [id number]  Vianne Bulls, MD ED   11/15/2016 19:55 11/20/2016 14:53 DNR [id number]  Karmen Bongo, MD Inpatient   12/07/2015 22:11 12/09/2015 17:07 Full Code [id number]  Phillips Grout, MD Inpatient  11/12/2015 18:38 11/18/2015 21:02 Full Code [id number]  Erline Hau, MD Inpatient   02/11/2015 23:37 02/15/2015 15:56 Full Code [id number]  Truett Mainland, DO Inpatient   04/14/2014 19:04 04/15/2014 23:15 Full Code [id number]  Doree Albee, MD ED   03/10/2013 11:51 03/12/2013 17:41 Full Code [id number]  Radene Gunning, NP ED   09/25/2012 07:32 09/30/2012 20:07 Full Code [id number]  Yehuda Budd, RN Inpatient       Prognosis:   < 6 months, or less would not be surprising based on metastatic cancer burden, with increasing pain.  Discharge Planning:  Home, hospice evaluation as outpatient  Care plan was discussed with nursing staff, Dr.  Manuella Ghazi.  Thank you for allowing the Palliative Medicine Team to assist in the care of this patient.   Time In: 1040 Time Out: 1120 Total Time 40 minutes Prolonged Time Billed  no       Greater than 50%  of this time was spent counseling and coordinating care related to the above assessment and plan.  Drue Novel, NP  Please contact Palliative Medicine Team phone at [phone] for questions and concerns.

## 2017-12-02 NOTE — Care Management (Addendum)
[name], [name]    Male  [birth year] (66 yrs)  SS # [id number]  MRN: [id number]    Demographics [address], [place] [address] Home: [phone]       Work:        Mobile:        Email: No Email Available     PCP: [facility] * Employment:Retired            Patient Contacts Markevius Trombetta (Spouse) Josepha Pigg (Other) Showing 2 of 3      [phone] Show All            Jacelyn Pi (Sister)        [phone]        Guarantors & Coverages  Add Guarantor                                   P/F Tyshun, T[place] [[id number]] Encounter guarantor  Show all cvgs   Add Coverage        Guarantor Demographics Address linked to patient Home: [phone] Rel to patient: Self      Work:     [employer] Info  Employment:Retired    Prof acct balance: 0.00 Hosp acct balance: 188.00 Add Account Note                                     1. E-MEDICARE/MEDICARE PART *   Encounter coverage  E-Verified Response History         Subscriber Demographics Dennie, [place] Home: [phone]     Address linked to patient Work:                  Coverage Info Member ID: [id number] Group:   Rel to subscriber: Self     Subscriber ID: [id number] Effective from: 08/17/2016 [place] phone:                                    2. Ernestina Penna OF*/[employer] [facility]*   Encounter coverage  E-Verified Response History         Subscriber Demographics Netta Corrigan Home:      [place], [place] [phone] Work:                  Coverage Info Member ID: [id number] Group:   Rel to subscriber: Self     Subscriber ID: [id number] Effective from: 08/17/2016 Auth phone:                                  [facility] Account Note        Copay due: 0.00  Copay paid: 0.00       Prepay due: 0.00  Prepay paid: 0.00                    Additional Info

## 2017-12-02 NOTE — Discharge Instructions (Signed)
1. Follow up with PCP in 1-2 weeks 2. Follow-up with oncologist [name] as scheduled this upcoming Friday

## 2017-12-02 NOTE — Telephone Encounter (Signed)
Call Debra lmovm states "pt is being discharged from [facility] and being referred to Palliative in home. Please advise if [name] is in agreement with this request." Message forwarded to [name] for review. Debra call back [phone]

## 2017-12-02 NOTE — Progress Notes (Signed)
Discharge instructions gone over with patient and family, verbalized understanding. IV removed, patient tolerated procedure well. Printed prescription given to patient.

## 2017-12-03 NOTE — Telephone Encounter (Signed)
He has follow up this week. Will discuss with the patient.

## 2017-12-06 ENCOUNTER — Inpatient Hospital Stay: Payer: Medicare Other

## 2017-12-06 ENCOUNTER — Inpatient Hospital Stay: Payer: Medicare Other | Attending: Oncology | Admitting: Oncology

## 2017-12-06 ENCOUNTER — Telehealth: Payer: Self-pay | Admitting: Oncology

## 2017-12-06 VITALS — BP 153/69 | HR 84 | Temp 98.5°F | Resp 18 | Ht 71.0 in | Wt 189.1 lb

## 2017-12-06 DIAGNOSIS — C649 Malignant neoplasm of unspecified kidney, except renal pelvis: Secondary | ICD-10-CM

## 2017-12-06 DIAGNOSIS — C7889 Secondary malignant neoplasm of other digestive organs: Secondary | ICD-10-CM | POA: Diagnosis not present

## 2017-12-06 DIAGNOSIS — I82403 Acute embolism and thrombosis of unspecified deep veins of lower extremity, bilateral: Secondary | ICD-10-CM | POA: Diagnosis not present

## 2017-12-06 DIAGNOSIS — C642 Malignant neoplasm of left kidney, except renal pelvis: Secondary | ICD-10-CM | POA: Diagnosis present

## 2017-12-06 DIAGNOSIS — D696 Thrombocytopenia, unspecified: Secondary | ICD-10-CM | POA: Diagnosis not present

## 2017-12-06 DIAGNOSIS — D649 Anemia, unspecified: Secondary | ICD-10-CM | POA: Insufficient documentation

## 2017-12-06 DIAGNOSIS — Z7901 Long term (current) use of anticoagulants: Secondary | ICD-10-CM | POA: Diagnosis not present

## 2017-12-06 LAB — CMP (CANCER CENTER ONLY)
ALT: 18 U/L (ref 0–55)
AST: 12 U/L (ref 5–34)
Albumin: 2.4 g/dL — ABNORMAL LOW (ref 3.5–5.0)
Alkaline Phosphatase: 93 U/L (ref 40–150)
Anion gap: 9 (ref 3–11)
BUN: 10 mg/dL (ref 7–26)
CO2: 29 mmol/L (ref 22–29)
Calcium: 8.1 mg/dL — ABNORMAL LOW (ref 8.4–10.4)
Chloride: 104 mmol/L (ref 98–109)
Creatinine: 1.06 mg/dL (ref 0.70–1.30)
GFR, Est AFR Am: >60 mL/min (ref >60)
Glucose, Bld: 160 mg/dL — ABNORMAL HIGH (ref 70–140)
POTASSIUM: 3.2 mmol/L — AB (ref 3.5–5.1)
Sodium: 142 mmol/L (ref 136–145)
Total Bilirubin: 0.4 mg/dL (ref 0.2–1.2)
Total Protein: 5.7 g/dL — ABNORMAL LOW (ref 6.4–8.3)

## 2017-12-06 LAB — CBC WITH DIFFERENTIAL (CANCER CENTER ONLY)
BASOS ABS: 0 10*3/uL (ref 0.0–0.1)
BASOS PCT: 0 %
Eosinophils Absolute: 0 10*3/uL (ref 0.0–0.5)
Eosinophils Relative: 0 %
HCT: 29.1 % — ABNORMAL LOW (ref 38.4–49.9)
Hemoglobin: 8.8 g/dL — ABNORMAL LOW (ref 13.0–17.1)
LYMPHS PCT: 4 %
Lymphs Abs: 0.3 10*3/uL — ABNORMAL LOW (ref 0.9–3.3)
MCH: 30.1 pg (ref 27.2–33.4)
MCHC: 30.2 g/dL — ABNORMAL LOW (ref 32.0–36.0)
MCV: 99.7 fL — AB (ref 79.3–98.0)
MONO ABS: 0.2 10*3/uL (ref 0.1–0.9)
Monocytes Relative: 3 %
Neutro Abs: 6.6 10*3/uL — ABNORMAL HIGH (ref 1.5–6.5)
Neutrophils Relative %: 93 %
PLATELETS: 191 10*3/uL (ref 140–400)
RBC: 2.92 MIL/uL — ABNORMAL LOW (ref 4.20–5.82)
RDW: 18.1 % — AB (ref 11.0–14.6)
WBC Count: 7.1 10*3/uL (ref 4.0–10.3)

## 2017-12-06 NOTE — Telephone Encounter (Signed)
Scheduled appt per 3/22 los - Gave patient AVS and calender per los.  

## 2017-12-06 NOTE — Progress Notes (Signed)
Hematology and Oncology Follow Up Visit  Jeffrey Trujillo [id number] 12/06/17     Principle Diagnosis: 67 year old man with the following issues:  1.  Right kidney mass presented in 2010 and found to have T1b right kidney after a radical nephrectomy on June 27, 2009.  2. Left kidney neoplasm diagnosed 07/2012.  He developed metastatic disease to the pancreas documented in December 2015.    Prior therapy:  He was started on Votrient  800 mg daily started in December 2013.Therapy discontinued in June 2017 because of progression of disease.   Current therapy:   Cabometyx 40 mg daily started in July 2017.    Interim History: Jeffrey Trujillo is here for a follow-up visit.  Since her last visit, he was hospitalized on 2 separate occasions in [place]. He was found to have bilateral lower extremity DVT and acute pancreatitis.  He was started on Lovenox and his symptoms has improved.  He presented with abdominal pain that required Dilaudid PCA and steroids and his symptoms at this time continues to improve.  Since his discharge, he has resumed a lot of activities of daily living and currently has no difficulties given himself Lovenox.  He denies any bleeding episodes.  He continues to have lower extremity edema.  He resumed Cabometyx 40 mg daily since his discharge without any difficulties.  He denies any worsening diarrhea, excessive fatigue or tiredness.  His abdominal pain is improved at this time and was discharged on long-acting oxycodone with breakthrough in between.  He has been using very little breakthrough pain medication at this time.    He did not report any headaches blurred vision double vision.   He does not report any fevers, chills or sweats.  He does not report any chest pain, palpitation orthopnea.  He does report lower extremity edema.  He does not report any cough, wheezing or hemoptysis.  He does not report any frequency urgency or hesitancy. He does not report any other  urinary symptoms.  He does not report any skeletal complaints.  Denies any skin rashes or lesions.  He denies any anxiety or depression.  He denies any heat or cold intolerance.  Remainder of his review of systems is negative.   Medications: Reviewed today and remained same. Current Outpatient Medications  Medication Sig Dispense Refill  . allopurinol (ZYLOPRIM) 100 MG tablet Take 0.5 tablets (50 mg total) by mouth 2 (two) times daily. (Patient taking differently: Take 100 mg by mouth 2 (two) times daily. ) 20 tablet 0  . bisacodyl (DULCOLAX) 10 MG suppository Place 1 suppository (10 mg total) rectally daily as needed for moderate constipation. 12 suppository 0  . enoxaparin (LOVENOX) 120 MG/0.8ML injection Inject 0.8 mLs (120 mg total) into the skin daily. 60 Syringe 0  . oxyCODONE (OXY IR/ROXICODONE) 5 MG immediate release tablet Take 1 tablet (5 mg total) by mouth every 2 (two) hours as needed for breakthrough pain. 30 tablet 0  . oxyCODONE (OXYCONTIN) 20 mg 12 hr tablet Take 1 tablet (20 mg total) by mouth every 12 (twelve) hours. 60 tablet 0  . pantoprazole (PROTONIX) 40 MG tablet Take 1 tablet (40 mg total) by mouth daily. 30 tablet 0  . predniSONE (DELTASONE) 20 MG tablet Take 1 tablet (20 mg total) by mouth 2 (two) times daily with a meal for 1 day, THEN 0.5 tablets (10 mg total) 2 (two) times daily with a meal for 2 days, THEN 0.5 tablets (10 mg total) 2 (two) times daily with  a meal for 2 days. 30 tablet 0   No current facility-administered medications for this visit.      Allergies:  Allergies  Allergen Reactions  . Nsaids Other (See Comments)    Patient only has one kidney.   . Meperidine Hcl Hives and Rash    Past Medical History, Surgical history, Social history, and Family History were reviewed and updated.   Physical Exam: Blood pressure (!) 153/69, pulse 84, temperature 98.5 F (36.9 C), temperature source Oral, resp. rate 18, height 5\' 11"  (1.803 m), weight 189 lb 1.6  oz (85.8 kg), SpO2 99 %.   ECOG: 1 General appearance: Alert, awake gentleman without distress. Oropharynx: oral mucosa is moist and pink.. Eyes: Pupils are equal and round reactive to light. Lymph nodes: No lymphadenopathy palpated in the cervical, axillary or supraclavicular regions. Heart:regular rate and rhythm, S1, S2.  Bilateral lower extremity edema noted. Lung: Clear to auscultation without any rhonchi or wheezes. Abdomen: soft, without any rebound or guarding.  Good bowel sounds auscultated. Musculoskeletal: No joint effusion or effusion. Neurological exam: No motor, sensory deficits. Skin: No petechia or ecchymosis.   CBC    Component Value Date/Time   WBC 7.1 12/06/2017 0935   WBC 4.2 12/02/2017 0555   RBC 2.92 (L) 12/06/2017 0935   HGB 8.1 (L) 12/02/2017 0555   HGB 10.8 (L) 07/30/2017 1004   HCT 29.1 (L) 12/06/2017 0935   HCT 32.8 (L) 07/30/2017 1004   PLT 191 12/06/2017 0935   PLT 124 (L) 07/30/2017 1004   MCV 99.7 (H) 12/06/2017 0935   MCV 101.7 (H) 07/30/2017 1004   MCH 30.1 12/06/2017 0935   MCHC 30.2 (L) 12/06/2017 0935   RDW 18.1 (H) 12/06/2017 0935   RDW 20.4 (H) 07/30/2017 1004   LYMPHSABS 0.3 (L) 12/06/2017 0935   LYMPHSABS 0.3 (L) 07/30/2017 1004   MONOABS 0.2 12/06/2017 0935   MONOABS 0.4 07/30/2017 1004   EOSABS 0.0 12/06/2017 0935   EOSABS 0.0 07/30/2017 1004   BASOSABS 0.0 12/06/2017 0935   BASOSABS 0.0 07/30/2017 1004      Impression and Plan:   68 year old gentleman with the following issues:   1.  Metastatic renal cell carcinoma with a left kidney mass as well as pancreatic lesion.     He is currently on Cabometyx which was resumed in April 2018.   CT scan obtained on March 2019 was personally reviewed and discussed with the patient.  His disease appears to be stable without any widespread metastasis.  Risks and benefits of continuing this medication versus transitioning into hospice was discussed today.  Given his reasonable  performance status and overall stable disease, he opted to continue with anticancer treatment.  For the time being, we will defer hospice option.   2. Pancreatic lesion.  This is due to metastatic renal cell carcinoma.  No recent changes noted on his CT scan in March 2019.  3. Thrombocytopenia: Platelet count is back within normal range.  4. Anemia.  Related to chronic disease and malignancy.  Hemoglobin is improving since his discharge.  5. Increase LFTs: Liver function test back within normal range.  6.  Bilateral DVT: He is currently on Lovenox and will continue that indefinitely.  We transition to Xarelto in the future if needed.   7.  Pain: He is currently on OxyContin and oxycodone and his pain is manageable.  8.  Prognosis: This was discussed today in detail.  He has an incurable malignancy with multiple comorbid conditions.  His  performance status remains adequate and would like to continue with aggressive therapy.  9. Follow-up:  In 4 weeks to follow his progress.  25 minutes was spent face-to-face with the patient today.  More than 50% of the time was dedicated to education, counseling and discussing his future plans of care.   [name] MD 12/06/17

## 2017-12-10 ENCOUNTER — Telehealth: Payer: Self-pay | Admitting: *Deleted

## 2017-12-10 NOTE — Telephone Encounter (Signed)
Wife joyce calling for a refill on oxycodone 5 mg tablets 1 every 2 hours for pain. # 30. They use [facility] in [place], [place].

## 2017-12-10 NOTE — Telephone Encounter (Signed)
Ok to refill 

## 2017-12-11 ENCOUNTER — Other Ambulatory Visit: Payer: Self-pay | Admitting: *Deleted

## 2017-12-11 MED ORDER — OXYCODONE HCL 5 MG PO TABS: 5.0000 mg | ORAL_TABLET | ORAL | 0 refills | Status: DC | PRN

## 2017-12-16 ENCOUNTER — Encounter (HOSPITAL_COMMUNITY): Payer: Self-pay | Admitting: Emergency Medicine

## 2017-12-16 ENCOUNTER — Other Ambulatory Visit: Payer: Self-pay

## 2017-12-16 ENCOUNTER — Emergency Department (HOSPITAL_COMMUNITY): Payer: Medicare Other

## 2017-12-16 ENCOUNTER — Inpatient Hospital Stay (HOSPITAL_COMMUNITY)
Admission: EM | Admit: 2017-12-16 | Discharge: 2017-12-20 | DRG: 554 | Disposition: A | Discharge status: Home / Self Care | Payer: Medicare Other | Attending: Internal Medicine | Admitting: Internal Medicine

## 2017-12-16 DIAGNOSIS — D631 Anemia in chronic kidney disease: Secondary | ICD-10-CM | POA: Diagnosis present

## 2017-12-16 DIAGNOSIS — M109 Gout, unspecified: Secondary | ICD-10-CM | POA: Diagnosis not present

## 2017-12-16 DIAGNOSIS — K604 Rectal fistula, unspecified: Secondary | ICD-10-CM

## 2017-12-16 DIAGNOSIS — G8929 Other chronic pain: Secondary | ICD-10-CM | POA: Diagnosis present

## 2017-12-16 DIAGNOSIS — I82403 Acute embolism and thrombosis of unspecified deep veins of lower extremity, bilateral: Secondary | ICD-10-CM | POA: Diagnosis present

## 2017-12-16 DIAGNOSIS — Z87891 Personal history of nicotine dependence: Secondary | ICD-10-CM | POA: Diagnosis not present

## 2017-12-16 DIAGNOSIS — E871 Hypo-osmolality and hyponatremia: Secondary | ICD-10-CM | POA: Diagnosis present

## 2017-12-16 DIAGNOSIS — E440 Moderate protein-calorie malnutrition: Secondary | ICD-10-CM | POA: Diagnosis present

## 2017-12-16 DIAGNOSIS — M79605 Pain in left leg: Secondary | ICD-10-CM | POA: Diagnosis not present

## 2017-12-16 DIAGNOSIS — M545 Low back pain: Secondary | ICD-10-CM

## 2017-12-16 DIAGNOSIS — K869 Disease of pancreas, unspecified: Secondary | ICD-10-CM | POA: Diagnosis present

## 2017-12-16 DIAGNOSIS — N183 Chronic kidney disease, stage 3 (moderate): Secondary | ICD-10-CM | POA: Diagnosis present

## 2017-12-16 DIAGNOSIS — C642 Malignant neoplasm of left kidney, except renal pelvis: Secondary | ICD-10-CM | POA: Diagnosis present

## 2017-12-16 DIAGNOSIS — K2270 Barrett's esophagus without dysplasia: Secondary | ICD-10-CM | POA: Diagnosis present

## 2017-12-16 DIAGNOSIS — M10071 Idiopathic gout, right ankle and foot: Secondary | ICD-10-CM | POA: Diagnosis present

## 2017-12-16 DIAGNOSIS — R52 Pain, unspecified: Secondary | ICD-10-CM | POA: Diagnosis not present

## 2017-12-16 DIAGNOSIS — M10072 Idiopathic gout, left ankle and foot: Secondary | ICD-10-CM | POA: Diagnosis not present

## 2017-12-16 DIAGNOSIS — Z515 Encounter for palliative care: Secondary | ICD-10-CM | POA: Diagnosis not present

## 2017-12-16 DIAGNOSIS — M549 Dorsalgia, unspecified: Secondary | ICD-10-CM | POA: Diagnosis present

## 2017-12-16 DIAGNOSIS — K615 Supralevator abscess: Secondary | ICD-10-CM

## 2017-12-16 DIAGNOSIS — Z905 Acquired absence of kidney: Secondary | ICD-10-CM

## 2017-12-16 DIAGNOSIS — K219 Gastro-esophageal reflux disease without esophagitis: Secondary | ICD-10-CM | POA: Diagnosis present

## 2017-12-16 DIAGNOSIS — M79606 Pain in leg, unspecified: Secondary | ICD-10-CM | POA: Diagnosis present

## 2017-12-16 DIAGNOSIS — Z79899 Other long term (current) drug therapy: Secondary | ICD-10-CM

## 2017-12-16 DIAGNOSIS — D696 Thrombocytopenia, unspecified: Secondary | ICD-10-CM | POA: Diagnosis present

## 2017-12-16 DIAGNOSIS — E860 Dehydration: Secondary | ICD-10-CM | POA: Diagnosis present

## 2017-12-16 DIAGNOSIS — K611 Rectal abscess: Secondary | ICD-10-CM | POA: Diagnosis not present

## 2017-12-16 DIAGNOSIS — C7889 Secondary malignant neoplasm of other digestive organs: Secondary | ICD-10-CM | POA: Diagnosis present

## 2017-12-16 DIAGNOSIS — Z6826 Body mass index (BMI) 26.0-26.9, adult: Secondary | ICD-10-CM | POA: Diagnosis not present

## 2017-12-16 DIAGNOSIS — C649 Malignant neoplasm of unspecified kidney, except renal pelvis: Secondary | ICD-10-CM | POA: Diagnosis not present

## 2017-12-16 DIAGNOSIS — M79604 Pain in right leg: Secondary | ICD-10-CM | POA: Diagnosis not present

## 2017-12-16 DIAGNOSIS — Z66 Do not resuscitate: Secondary | ICD-10-CM | POA: Diagnosis present

## 2017-12-16 DIAGNOSIS — Z7189 Other specified counseling: Secondary | ICD-10-CM | POA: Diagnosis not present

## 2017-12-16 DIAGNOSIS — D649 Anemia, unspecified: Secondary | ICD-10-CM | POA: Diagnosis present

## 2017-12-16 LAB — COMPREHENSIVE METABOLIC PANEL
ALT: 11 U/L — ABNORMAL LOW (ref 17–63)
AST: 16 U/L (ref 15–41)
Albumin: 2.2 g/dL — ABNORMAL LOW (ref 3.5–5.0)
Alkaline Phosphatase: 87 U/L (ref 38–126)
Anion gap: 10 (ref 5–15)
BUN: 9 mg/dL (ref 6–20)
CO2: 24 mmol/L (ref 22–32)
Calcium: 8 mg/dL — ABNORMAL LOW (ref 8.9–10.3)
Chloride: 99 mmol/L — ABNORMAL LOW (ref 101–111)
Creatinine, Ser: 1.24 mg/dL (ref 0.61–1.24)
GFR calc Af Amer: >60 mL/min (ref >60)
GFR calc non Af Amer: 59 mL/min — ABNORMAL LOW (ref >60)
Glucose, Bld: 100 mg/dL — ABNORMAL HIGH (ref 65–99)
Potassium: 4.1 mmol/L (ref 3.5–5.1)
Sodium: 133 mmol/L — ABNORMAL LOW (ref 135–145)
Total Bilirubin: 1.2 mg/dL (ref 0.3–1.2)
Total Protein: 5.3 g/dL — ABNORMAL LOW (ref 6.5–8.1)

## 2017-12-16 LAB — CBC WITH DIFFERENTIAL/PLATELET
Basophils Absolute: 0 10*3/uL (ref 0.0–0.1)
Basophils Relative: 0 %
Eosinophils Absolute: 0 10*3/uL (ref 0.0–0.7)
Eosinophils Relative: 0 %
HCT: 27.3 % — ABNORMAL LOW (ref 39.0–52.0)
Hemoglobin: 8.5 g/dL — ABNORMAL LOW (ref 13.0–17.0)
Lymphocytes Relative: 12 %
Lymphs Abs: 0.6 10*3/uL — ABNORMAL LOW (ref 0.7–4.0)
MCH: 28.9 pg (ref 26.0–34.0)
MCHC: 31.1 g/dL (ref 30.0–36.0)
MCV: 92.9 fL (ref 78.0–100.0)
Monocytes Absolute: 0.2 10*3/uL (ref 0.1–1.0)
Monocytes Relative: 5 %
Neutro Abs: 4.2 10*3/uL (ref 1.7–7.7)
Neutrophils Relative %: 83 %
Platelets: 139 10*3/uL — ABNORMAL LOW (ref 150–400)
RBC: 2.94 MIL/uL — ABNORMAL LOW (ref 4.22–5.81)
RDW: 18.1 % — ABNORMAL HIGH (ref 11.5–15.5)
WBC: 5.1 10*3/uL (ref 4.0–10.5)

## 2017-12-16 LAB — LIPASE, BLOOD: LIPASE: 21 U/L (ref 11–51)

## 2017-12-16 LAB — LACTIC ACID, PLASMA: Lactic Acid, Venous: 0.8 mmol/L (ref 0.5–1.9)

## 2017-12-16 MED ORDER — ACETAMINOPHEN 325 MG PO TABS: 650.0000 mg | ORAL_TABLET | Freq: Four times a day (QID) | ORAL | Status: DC | PRN

## 2017-12-16 MED ORDER — HYDROMORPHONE HCL 1 MG/ML IJ SOLN
1.0000 mg | INTRAMUSCULAR | Status: DC | PRN
  Administered 2017-12-16 – 2017-12-17 (×2): 1 mg via INTRAVENOUS
  Filled 2017-12-16 (×2): qty 1

## 2017-12-16 MED ORDER — ALLOPURINOL 100 MG PO TABS
100.0000 mg | ORAL_TABLET | Freq: Two times a day (BID) | ORAL | Status: DC
  Administered 2017-12-16 – 2017-12-20 (×8): 100 mg via ORAL
  Filled 2017-12-16 (×8): qty 1

## 2017-12-16 MED ORDER — ACETAMINOPHEN 650 MG RE SUPP: 650.0000 mg | Freq: Four times a day (QID) | RECTAL | Status: DC | PRN

## 2017-12-16 MED ORDER — COLCHICINE 0.6 MG PO TABS
0.6000 mg | ORAL_TABLET | Freq: Once | ORAL | Status: AC
  Administered 2017-12-16: 0.6 mg via ORAL
  Filled 2017-12-16: qty 1

## 2017-12-16 MED ORDER — ENSURE ENLIVE PO LIQD
237.0000 mL | Freq: Two times a day (BID) | ORAL | Status: DC
  Administered 2017-12-18 – 2017-12-20 (×5): 237 mL via ORAL

## 2017-12-16 MED ORDER — BISACODYL 10 MG RE SUPP: 10.0000 mg | Freq: Every day | RECTAL | Status: DC | PRN

## 2017-12-16 MED ORDER — HYDROMORPHONE HCL 1 MG/ML IJ SOLN
1.0000 mg | Freq: Once | INTRAMUSCULAR | Status: AC
  Administered 2017-12-16: 1 mg via INTRAVENOUS
  Filled 2017-12-16: qty 1

## 2017-12-16 MED ORDER — SODIUM CHLORIDE 0.9 % IV BOLUS
1000.0000 mL | Freq: Once | INTRAVENOUS | Status: AC
  Administered 2017-12-16: 1000 mL via INTRAVENOUS

## 2017-12-16 MED ORDER — ENOXAPARIN SODIUM 120 MG/0.8ML ~~LOC~~ SOLN
120.0000 mg | SUBCUTANEOUS | Status: DC
  Administered 2017-12-16 – 2017-12-19 (×4): 120 mg via SUBCUTANEOUS
  Filled 2017-12-16 (×6): qty 0.8

## 2017-12-16 MED ORDER — LORAZEPAM 2 MG/ML IJ SOLN
0.5000 mg | Freq: Once | INTRAMUSCULAR | Status: AC
  Administered 2017-12-16: 0.5 mg via INTRAVENOUS
  Filled 2017-12-16: qty 1

## 2017-12-16 MED ORDER — PANTOPRAZOLE SODIUM 40 MG PO TBEC
40.0000 mg | DELAYED_RELEASE_TABLET | Freq: Every day | ORAL | Status: DC
  Administered 2017-12-16 – 2017-12-20 (×5): 40 mg via ORAL
  Filled 2017-12-16 (×5): qty 1

## 2017-12-16 MED ORDER — CABOZANTINIB S-MALATE 40 MG PO TABS
40.0000 mg | ORAL_TABLET | Freq: Every day | ORAL | Status: DC
  Administered 2017-12-18 – 2017-12-20 (×3): 40 mg via ORAL

## 2017-12-16 MED ORDER — PRO-STAT SUGAR FREE PO LIQD
30.0000 mL | Freq: Two times a day (BID) | ORAL | Status: DC
  Administered 2017-12-16 – 2017-12-20 (×8): 30 mL via ORAL
  Filled 2017-12-16 (×11): qty 30

## 2017-12-16 MED ORDER — IOPAMIDOL (ISOVUE-300) INJECTION 61%
100.0000 mL | Freq: Once | INTRAVENOUS | Status: AC | PRN
  Administered 2017-12-16: 100 mL via INTRAVENOUS

## 2017-12-16 MED ORDER — HYDROMORPHONE HCL 1 MG/ML IJ SOLN
1.0000 mg | INTRAMUSCULAR | Status: AC | PRN
  Administered 2017-12-16 (×2): 1 mg via INTRAVENOUS
  Filled 2017-12-16 (×2): qty 1

## 2017-12-16 MED ORDER — VANCOMYCIN HCL IN DEXTROSE 1-5 GM/200ML-% IV SOLN
1000.0000 mg | Freq: Once | INTRAVENOUS | Status: AC
  Administered 2017-12-16: 1000 mg via INTRAVENOUS
  Filled 2017-12-16: qty 200

## 2017-12-16 MED ORDER — PIPERACILLIN-TAZOBACTAM 3.375 G IVPB 30 MIN
3.3750 g | Freq: Once | INTRAVENOUS | Status: AC
  Administered 2017-12-16: 3.375 g via INTRAVENOUS
  Filled 2017-12-16: qty 50

## 2017-12-16 NOTE — H&P (Signed)
TRH H&P   Patient Demographics:    Jeffrey Trujillo, is a 67 y.o. male  MRN: [id number]   DOB - October 06, 1950  Admit Date - 12/16/2017  Outpatient Primary MD for the patient is [facility]  Referring MD/NP/PA: [name]  Outpatient Specialists:     Patient coming from: home  Chief Complaint  Patient presents with  . Fever  . Abscess      HPI:    Jeffrey Trujillo  is a 67 y.o. male,w Anemia, CKD metastatic renal cell carcinoma, anal fistula apparently c/o increase in bilateral lower ext pain since recently discharged.  Pt also notes back pain.    In ED, CT abd pelvis  Again identified large mass at the anterior aspect of the upper LEFT kidney consistent with a renal neoplasm demonstrating evidence of LEFT renal vein invasion/extension.  Two pancreatic masses are identified question pancreatic metastases, not significantly changed.  Subtle focus of wall thickening and pericolic infiltrative changes at the mid sigmoid colon question diverticulitis though neoplasm not completely excluded, can be assessed by colonoscopy.  Tiny RIGHT perirectal abscess 14 mm diameter significantly decreased from the previous exam.  Na 133, K 4.1, Bun 9, Creatinine 1.24 Ast 16, Alt 11 Alb 2.2 Wbc 5.1, Hgb 8.5, Plt 139  Pt will be admitted for bilateral leg pain, back pain, and severe protein calorie malnutrition and hyponatremia, and anemia    Review of systems:    In addition to the HPI above,  No Fever-chills, No Headache, No changes with Vision or hearing, No problems swallowing food or Liquids, No Chest pain, Cough or Shortness of Breath, No Abdominal pain, No Nausea or Vommitting, Bowel movements are regular, No Blood in stool or Urine, No dysuria, No new skin rashes or bruises,  No new weakness, tingling, numbness in any extremity, No recent  weight gain or loss, No polyuria, polydypsia or polyphagia, No significant Mental Stressors.  A full 10 point Review of Systems was done, except as stated above, all other Review of Systems were negative.   With Past History of the following :    Past Medical History:  Diagnosis Date  . Anal fistula   . Anemia in chronic renal disease   . Barrett's esophagus    by EGD 11/02/2009  . Chronic pain   . CKD (chronic kidney disease), stage III ([place])   . Diverticulosis of colon    by colonoscopy 11/02/2009  . DJD (degenerative joint disease) of cervical spine   . GERD (gastroesophageal reflux disease)   . Gouty arthritis    per pt on 05-16-2016 stable (last bout bilatearl wrist 03/ 2017)  . History of diverticulitis of colon    w/ small perforation , no surgical intervention 06/ 2014  . History of peptic ulcer disease   . Iron deficiency anemia due to chronic blood loss   .  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 Nocturia   . 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 Wears glasses       Past Surgical History:  Procedure Laterality Date  . COLONOSCOPY  Feb 2011   [name]: single anal papilla, pan-colonic diverticula  . COLONOSCOPY  09/25/2012   RMR: Colonic diverticulosis. Suspect diverticular bleeding-hepatic flexure tic-sealed  . ESOPHAGOGASTRODUODENOSCOPY  Feb 2011   [name]: salmon-colored epithelium consistent with Barrett's, nodular antral erosions, small hiatal hernia, negative H.pylori  . ESOPHAGOGASTRODUODENOSCOPY  09/25/2012   ZOX:WRUEAVW'U esophagus. Small hiatal hernia. Antral erosions of doubtful clinical significance, no biopsies  . ESOPHAGOGASTRODUODENOSCOPY N/A 03/11/2013   SLF:  Barrett's esophagus/Single ulcer in the gastric antrum-PROBABLE SOURCE FOR BLACK STOOL/ Chronic gastritis in the gastric antrum  . ESOPHAGOGASTRODUODENOSCOPY N/A 10/11/2014   Dr. [name] barrett's s/p biopsy. Hiatal hernia. Biopsy with intestinal metaplasia, glandular atypia indefinite for low grade dysplasia noted as well  . ESOPHAGOGASTRODUODENOSCOPY N/A 01/19/2015   [name]: Barrett's no dysplasia  . EUS  08/07/2012   Procedure: UPPER ENDOSCOPIC ULTRASOUND (EUS) LINEAR;  Surgeon: [name], MD;  Location: [facility];  Service: Endoscopy;  Laterality: N/A;  . EUS  08/21/2012   Procedure: UPPER ENDOSCOPIC ULTRASOUND (EUS) LINEAR;  Surgeon: [name], MD;  Location: [facility];  Service: Endoscopy;  Laterality: N/A;  . INCISION AND DRAINAGE ABSCESS N/A 11/13/2015   Procedure: INCISION AND DRAINAGE ABSCESS;  Surgeon: Aviva Signs, MD;  Location: [facility];  Service: General;  Laterality: N/A;  . INGUINAL HERNIA REPAIR Bilateral 1990's  . LAPAROSCOPIC NEPHRECTOMY Right 06/27/2009   dr Alinda Money  . LUNG REMOVAL, PARTIAL  1970   left lower lobe, benign      Social History:     Social History   Tobacco Use  . Smoking status: Former Smoker    Packs/day: 0.25    Years: 4.00    Pack years: 1.00    Types: Cigars    Last attempt to quit: 05/16/2014    Years since quitting: 3.5  . Smokeless tobacco: Current User    Types: Chew  Substance Use Topics  . Alcohol use: Yes    Alcohol/week: 2.4 - 3.0 oz    Types: 2 - 3 Cans of beer, 2 Shots of liquor per week    Comment: binges intermittently, 1/5 vodka daily until it makes him sick, last ETOH was about 3 months ago     Lives -  At home Mobility -  Walks by self  Family History :     Family History  Problem Relation Age of Onset  . Lung cancer Brother   . Prostate cancer Brother   . Lumbar disc disease Brother   . Prostate cancer Brother   . Colon cancer Neg Hx       Home Medications:   Prior to Admission  medications   Medication Sig Start Date End Date Taking? Authorizing Provider  allopurinol (ZYLOPRIM) 100 MG tablet Take 0.5 tablets (50 mg total) by mouth 2 (two) times daily. Patient taking differently: Take 100 mg by mouth 2 (two) times daily.  09/29/17  Yes Carmin Muskrat, MD  bisacodyl (DULCOLAX) 10  MG suppository Place 1 suppository (10 mg total) rectally daily as needed for moderate constipation. 12/02/17  Yes Shah, Pratik D, DO  cabozantinib (CABOMETYX) 40 MG tablet Take 40 mg by mouth daily.   Yes [provider]  enoxaparin (LOVENOX) 120 MG/0.8ML injection Inject 0.8 mLs (120 mg total) into the skin daily. 11/17/17  Yes Ghimire, Henreitta Leber, MD  oxyCODONE (OXY IR/ROXICODONE) 5 MG immediate release tablet Take 1 tablet (5 mg total) by mouth every 2 (two) hours as needed for breakthrough pain. 12/11/17  Yes [name], MD  oxyCODONE (OXYCONTIN) 20 mg 12 hr tablet Take 1 tablet (20 mg total) by mouth every 12 (twelve) hours. 12/02/17  Yes Shah, Pratik D, DO  pantoprazole (PROTONIX) 40 MG tablet Take 1 tablet (40 mg total) by mouth daily. 12/03/17 01/02/18 Yes Manuella Ghazi, Pratik D, DO     Allergies:     Allergies  Allergen Reactions  . Nsaids Other (See Comments)    Patient only has one kidney.   Marland Kitchen Amoxicillin Nausea And Vomiting  . Zofran [Ondansetron Hcl] Hives  . Demerol [Meperidine] Rash     Physical Exam:   Vitals  Blood pressure (!) 155/80, pulse 97, temperature 98.8 F (37.1 C), temperature source Oral, resp. rate (!) 24, height 5\' 11"  (1.803 m), weight 86.2 kg (190 lb), SpO2 100 %.   1. General  lying in bed in NAD,   2. Normal affect and insight, Not Suicidal or Homicidal, Awake Alert, Oriented X 3.  3. No F.N deficits, ALL C.Nerves Intact, Strength 5/5 all 4 extremities, Sensation intact all 4 extremities, Plantars down going.  4. Ears and Eyes appear Normal, Conjunctivae clear, PERRLA. Moist Oral Mucosa.  5. Supple Neck, No JVD, No cervical  lymphadenopathy appriciated, No Carotid Bruits.  6. Symmetrical Chest wall movement, Good air movement bilaterally, CTAB.  7. RRR, No Gallops, Rubs or Murmurs, No Parasternal Heave.  8. Positive Bowel Sounds, Abdomen Soft, No tenderness, No organomegaly appriciated,No rebound -guarding or rigidity.  9.  No Cyanosis, Normal Skin Turgor, No Skin Rash or Bruise.  10. Good muscle tone,  joints appear normal , no effusions, Normal ROM.  11. No Palpable Lymph Nodes in Neck or Axillae     Data Review:    CBC Recent Labs  Lab 12/16/17 1519  WBC 5.1  HGB 8.5*  HCT 27.3*  PLT 139*  MCV 92.9  MCH 28.9  MCHC 31.1  RDW 18.1*  LYMPHSABS 0.6*  MONOABS 0.2  EOSABS 0.0  BASOSABS 0.0   ------------------------------------------------------------------------------------------------------------------  Chemistries  Recent Labs  Lab 12/16/17 1519  NA 133*  K 4.1  CL 99*  CO2 24  GLUCOSE 100*  BUN 9  CREATININE 1.24  CALCIUM 8.0*  AST 16  ALT 11*  ALKPHOS 87  BILITOT 1.2   ------------------------------------------------------------------------------------------------------------------ estimated creatinine clearance is 62.4 mL/min (by C-G formula based on SCr of 1.24 mg/dL). ------------------------------------------------------------------------------------------------------------------ No results for input(s): TSH, T4TOTAL, T3FREE, THYROIDAB in the last 72 hours.  Invalid input(s): FREET3  Coagulation profile No results for input(s): INR, PROTIME in the last 168 hours. ------------------------------------------------------------------------------------------------------------------- No results for input(s): DDIMER in the last 72 hours. -------------------------------------------------------------------------------------------------------------------  Cardiac Enzymes No results for input(s): CKMB, TROPONINI, MYOGLOBIN in the last 168 hours.  Invalid input(s):  CK ------------------------------------------------------------------------------------------------------------------    Component Value Date/Time   BNP 58.0 09/29/2017 1227     ---------------------------------------------------------------------------------------------------------------  Urinalysis    Component Value Date/Time   COLORURINE YELLOW 11/28/2017 0825   APPEARANCEUR CLEAR 11/28/2017 0825  LABSPEC 1.008 11/28/2017 0825   PHURINE 6.0 11/28/2017 0825   GLUCOSEU NEGATIVE 11/28/2017 0825   HGBUR NEGATIVE 11/28/2017 0825   BILIRUBINUR NEGATIVE 11/28/2017 0825   KETONESUR NEGATIVE 11/28/2017 0825   PROTEINUR NEGATIVE 11/28/2017 0825   UROBILINOGEN 4.0 (H) 02/11/2015 1409   NITRITE NEGATIVE 11/28/2017 0825   LEUKOCYTESUR NEGATIVE 11/28/2017 0825    ----------------------------------------------------------------------------------------------------------------   Imaging Results:    Ct Abdomen Pelvis W Contrast  Result Date: 12/16/2017 CLINICAL DATA:  Swelling and pain in BILATERAL lower extremities, history of buttock/anorectal abscess, metastases to pancreas, stage III chronic kidney disease, gout, peptic ulcer disease EXAM: CT ABDOMEN AND PELVIS WITH CONTRAST TECHNIQUE: Multidetector CT imaging of the abdomen and pelvis was performed using the standard protocol following bolus administration of intravenous contrast. Sagittal and coronal MPR images reconstructed from axial data set. CONTRAST:  149mL ISOVUE-300 IOPAMIDOL (ISOVUE-300) INJECTION 61% IV. No oral contrast. COMPARISON:  11/28/2017 FINDINGS: Lower chest: Bibasilar atelectasis Hepatobiliary: Distended gallbladder little changed. 7 mm cyst LEFT lobe image 20. Liver otherwise unremarkable. Pancreas: Mass lesion at proximal tail of pancreas 4.4 x 3.9 x 5.3 cm, centrally necrotic with peripheral enhancement. Additional mass at pancreatic head 2.6 x 1.8 x 2.5 cm. Findings are consistent with neoplasms/metastases. Spleen:  Unremarkable.  Small splenule. Adrenals/Urinary Tract: Adrenal glands normal appearance. Cyst at medial aspect upper pole LEFT kidney 3.5 x 3.8 cm image 28. Large irregular enhancing neoplasm at upper pole LEFT kidney 5.1 x 4.4 x 4.9 cm compatible with neoplasm, with tumor extending into the LEFT renal vein. Tiny cyst at inferior pole LEFT kidney. Prior RIGHT nephrectomy. LEFT ureter and urinary bladder unremarkable. Stomach/Bowel: Stomach decompressed. Few scattered distal colonic diverticula. Wall thickening of the mid sigmoid colon with hazy infiltration of pericolic tissue planes, could represent diverticulitis or tumor. Minimal RIGHT lateral rectal wall thickening with adjacent infiltrative changes of perirectal fat extending into perianal region and into medial RIGHT buttock compatible with perirectal abscess. Small gas and fluid collection in the RIGHT perirectal space measures 14 mm in greatest size, decreased from the 28 mm greatest diameter on the previous exam. Remaining large and small bowel loops unremarkable. Normal appendix. Vascular/Lymphatic: Vascular structures unremarkable. No adenopathy. Reproductive: Unremarkable prostate gland Other: No free intraperitoneal air or fluid. Tiny umbilical hernia containing fat. Musculoskeletal: Mild degenerative disc disease changes at L5-S1. Degenerative changes of the hip joints bilaterally. No focal bone lesions. IMPRESSION: Again identified large mass at the anterior aspect of the upper LEFT kidney consistent with a renal neoplasm demonstrating evidence of LEFT renal vein invasion/extension. Two pancreatic masses are identified question pancreatic metastases, not significantly changed. Subtle focus of wall thickening and pericolic infiltrative changes at the mid sigmoid colon question diverticulitis though neoplasm not completely excluded, can be assessed by colonoscopy. Tiny RIGHT perirectal abscess 14 mm diameter significantly decreased from the previous  exam. Electronically Signed   By: Lavonia Dana M.D.   On: 12/16/2017 17:52      Assessment & Plan:    Principal Problem:   Leg pain Active Problems:   Anemia   Back pain   Hyponatremia    Bilateral leg pain Back pain MRI L spine Dilaudid 1mg  iv q3h prn Palliative care consult  Anemia Check cbc in am  Hyponatremia Check cmp in am  Gout Cont allopurinol  Gerd Cont Protonix  Metastatic renal cell carcinoma Anal fistula Surgery consult      DVT Prophylaxis  Lovenox - SCDs   AM Labs Ordered, also please review Full Orders  Family Communication: Admission, patients condition and plan of care including tests being ordered have been discussed with the patient  who indicate understanding and agree with the plan and Code Status.  Code Status  FULL CODE  Likely DC to  home  Condition GUARDED    Consults called:   Palliative care, surgery by computer  Admission status: inpatient  Time spent in minutes : 45   Jani Gravel M.D on 12/16/2017 at 7:52 PM  Between 7am to 7pm - Pager - [phone]. After 7pm go to [email or web address] - password [facility]  [facility] - Office  [phone]

## 2017-12-16 NOTE — ED Triage Notes (Signed)
Pt reports swelling and pain in bilateral lower extremities and left hand with hx of gout.  Also had sx on abscess to buttocks which is open and draining.

## 2017-12-16 NOTE — ED Provider Notes (Signed)
[facility] EMERGENCY DEPARTMENT Provider Note   CSN: [id number] Arrival date & time: 12/16/17  1423     History   Chief Complaint Chief Complaint  Patient presents with  . Fever  . Abscess    HPI Jeffrey Trujillo is a 67 y.o. male.  HPI   67 year old male with multiple complaints. 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 He was admitted three times in March for DVT, pancreatitis and for pain control related to cancer and gout.   Today he is having severe pain in bilateral feet/ankles and also left wrist and thumb.  He feels like this pain may be from gout. He has eben taking oxycodone with minimal improvement.  He also has a past history of DVTs but he is currently on Lovenox and reports compliance. Also pain and drainage. He has a past hx of anorectal fistula and reports "I have pain that has come and gone for years." He has had increasing pain and drainage from this same area over the past few days. Subjective fever.    Past Medical History:  Diagnosis Date  . Anal fistula   . Anemia in chronic renal disease   . Barrett's esophagus    by EGD 11/02/2009  . Chronic pain   . CKD (chronic kidney disease), stage III ([place])   . Diverticulosis of colon    by colonoscopy 11/02/2009  . DJD (degenerative joint disease) of cervical spine   . GERD (gastroesophageal reflux disease)   . Gouty arthritis    per pt on 05-16-2016 stable (last bout bilatearl wrist 03/ 2017)  . History of diverticulitis of colon    w/ small perforation , no surgical intervention 06/ 2014  . History of peptic ulcer disease   . Iron deficiency anemia due to chronic blood loss   . 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 Nocturia   . Thrombocytopenia ([place])    secondary to oral chemo  . Wears glasses     Patient Active Problem List   Diagnosis Date Noted  . Encounter for hospice care discussion   . Palliative care by specialist   . Renal cell carcinoma ([place]) 11/28/2017  . Acute pancreatitis 11/22/2017  . DVT, bilateral lower limbs ([place]) 11/15/2017  . History of renal cell cancer   . Chronic neck pain   . Goals of care, counseling/discussion   . Palliative care encounter   . Encephalopathy acute 11/15/2016  . Fever 12/07/2015  . Gouty arthritis 12/07/2015  . Fall   . Pyrexia   . Acute gout 11/15/2015  . Perirectal abscess 11/12/2015  . Hypokalemia 11/12/2015  . Anemia of chronic disease 11/12/2015  . Gallstones   . Sinus bradycardia 02/14/2015  . Malnutrition of moderate degree ([place]) 02/12/2015  . Elevated LFTs 02/12/2015  . Abdominal pain, right upper quadrant 02/12/2015  . Acute kidney injury ([place]) 02/12/2015  . Cholecystitis 02/11/2015  . Hiatal hernia   . Chest pain 04/15/2014  . PUD (peptic ulcer disease) 04/21/2013  . Melena 03/10/2013  .  Flank pain 03/10/2013  . Leukopenia 03/10/2013  . Thrombocytopenia ([place]) 03/10/2013  . Abdominal abscess 03/10/2013  . Anemia 03/10/2013  . ETOH abuse 03/10/2013  . Bradycardia 03/10/2013  . Barrett's esophagus 01/04/2013  . Renal cell cancer ([place]) 09/25/2012  . Lower GI bleed 09/25/2012  . Anemia due to blood loss 09/25/2012  . Acute gout of right foot 09/25/2012  . DJD (degenerative joint disease) 09/25/2012  . Pancreatic mass 08/04/2012  . Acute strain of neck muscle 06/05/2011  . RECTAL BLEEDING 10/20/2009  . NAUSEA WITH VOMITING 10/20/2009  . DIARRHEA, BLOODY 10/20/2009    Past Surgical History:  Procedure Laterality Date  . COLONOSCOPY   Feb 2011   [name]: single anal papilla, pan-colonic diverticula  . COLONOSCOPY  09/25/2012   RMR: Colonic diverticulosis. Suspect diverticular bleeding-hepatic flexure tic-sealed  . ESOPHAGOGASTRODUODENOSCOPY  Feb 2011   [name]: salmon-colored epithelium consistent with Barrett's, nodular antral erosions, small hiatal hernia, negative H.pylori  . ESOPHAGOGASTRODUODENOSCOPY  09/25/2012   BWI:OMBTDHR'C esophagus. Small hiatal hernia. Antral erosions of doubtful clinical significance, no biopsies  . ESOPHAGOGASTRODUODENOSCOPY N/A 03/11/2013   SLF: Barrett's esophagus/Single ulcer in the gastric antrum-PROBABLE SOURCE FOR BLACK STOOL/ Chronic gastritis in the gastric antrum  . ESOPHAGOGASTRODUODENOSCOPY N/A 10/11/2014   Dr. [name] barrett's s/p biopsy. Hiatal hernia. Biopsy with intestinal metaplasia, glandular atypia indefinite for low grade dysplasia noted as well  . ESOPHAGOGASTRODUODENOSCOPY N/A 01/19/2015   [name]: Barrett's no dysplasia  . EUS  08/07/2012   Procedure: UPPER ENDOSCOPIC ULTRASOUND (EUS) LINEAR;  Surgeon: [name], MD;  Location: [facility] ENDOSCOPY;  Service: Endoscopy;  Laterality: N/A;  . EUS  08/21/2012   Procedure: UPPER ENDOSCOPIC ULTRASOUND (EUS) LINEAR;  Surgeon: [name], MD;  Location: [facility] ENDOSCOPY;  Service: Endoscopy;  Laterality: N/A;  . INCISION AND DRAINAGE ABSCESS N/A 11/13/2015   Procedure: INCISION AND DRAINAGE ABSCESS;  Surgeon: Aviva Signs, MD;  Location: [facility] ORS;  Service: General;  Laterality: N/A;  . INGUINAL HERNIA REPAIR Bilateral 1990's  . LAPAROSCOPIC NEPHRECTOMY Right 06/27/2009   dr Alinda Money  . LUNG REMOVAL, PARTIAL  1970   left lower lobe, benign        Home Medications    Prior to Admission medications   Medication Sig Start Date End Date Taking? Authorizing Provider  allopurinol (ZYLOPRIM) 100 MG tablet Take 0.5 tablets (50 mg total) by mouth 2 (two) times daily. Patient taking differently: Take 100 mg by mouth 2 (two)  times daily.  09/29/17   Carmin Muskrat, MD  bisacodyl (DULCOLAX) 10 MG suppository Place 1 suppository (10 mg total) rectally daily as needed for moderate constipation. 12/02/17   Manuella Ghazi, Pratik D, DO  enoxaparin (LOVENOX) 120 MG/0.8ML injection Inject 0.8 mLs (120 mg total) into the skin daily. 11/17/17   Ghimire, Henreitta Leber, MD  oxyCODONE (OXY IR/ROXICODONE) 5 MG immediate release tablet Take 1 tablet (5 mg total) by mouth every 2 (two) hours as needed for breakthrough pain. 12/11/17   [name], MD  oxyCODONE (OXYCONTIN) 20 mg 12 hr tablet Take 1 tablet (20 mg total) by mouth every 12 (twelve) hours. 12/02/17   Manuella Ghazi, Pratik D, DO  pantoprazole (PROTONIX) 40 MG tablet Take 1 tablet (40 mg total) by mouth daily. 12/03/17 01/02/18  Heath Lark D, DO    Family History Family History  Problem Relation Age of Onset  . Lung cancer Brother   . Prostate cancer Brother   . Lumbar disc disease Brother   . Prostate cancer Brother   .  Colon cancer Neg Hx     Social History Social History   Tobacco Use  . Smoking status: Former Smoker    Packs/day: 0.25    Years: 4.00    Pack years: 1.00    Types: Cigars    Last attempt to quit: 05/16/2014    Years since quitting: 3.5  . Smokeless tobacco: Current User    Types: Chew  Substance Use Topics  . Alcohol use: Yes    Alcohol/week: 2.4 - 3.0 oz    Types: 2 - 3 Cans of beer, 2 Shots of liquor per week    Comment: binges intermittently, 1/5 vodka daily until it makes him sick, last ETOH was about 3 months ago  . Drug use: No     Allergies   Nsaids; Amoxicillin; Zofran [ondansetron hcl]; and Demerol [meperidine]   Review of Systems Review of Systems  All systems reviewed and negative, other than as noted in HPI.  Physical Exam Updated Vital Signs BP (!) 142/70 (BP Location: Right Arm)   Pulse (!) 102   Temp 98.8 F (37.1 C) (Oral)   Resp 18   Ht 5\' 11"  (1.803 m)   Wt 86.2 kg (190 lb)   SpO2 96%   BMI 26.50 kg/m    Physical Exam  Constitutional: He appears well-developed and well-nourished. No distress.  Laying in bed. Appears extremely uncomfortable.   HENT:  Head: Normocephalic and atraumatic.  Eyes: Conjunctivae are normal. Right eye exhibits no discharge. Left eye exhibits no discharge.  Neck: Neck supple.  Cardiovascular: Normal rate, regular rhythm and normal heart sounds. Exam reveals no gallop and no friction rub.  No murmur heard. Pulmonary/Chest: Effort normal and breath sounds normal. No respiratory distress.  Abdominal: Soft. He exhibits no distension. There is no tenderness.  Genitourinary:  Genitourinary Comments: Indurated area perirectally R of midline with rusty colored watery drainage. Cannot tell clearly if this is draining from rectum or possible a small sinus perirectally.   Musculoskeletal: He exhibits no edema or tenderness.  Erythema, swelling and severe pain with passive ROM of b/l feet ankles. Mild swelling noted L wrist and L first MP joint.   Neurological: He is alert.  Skin: Skin is warm and dry.  Psychiatric: He has a normal mood and affect. His behavior is normal. Thought content normal.  Nursing note and vitals reviewed.    ED Treatments / Results  Labs (all labs ordered are listed, but only abnormal results are displayed) Labs Reviewed  COMPREHENSIVE METABOLIC PANEL - Abnormal; Notable for the following components:      Result Value   Sodium 133 (*)    Chloride 99 (*)    Glucose, Bld 100 (*)    Calcium 8.0 (*)    Total Protein 5.3 (*)    Albumin 2.2 (*)    ALT 11 (*)    GFR calc non Af Amer 59 (*)    All other components within normal limits  CBC WITH DIFFERENTIAL/PLATELET - Abnormal; Notable for the following components:   RBC 2.94 (*)    Hemoglobin 8.5 (*)    HCT 27.3 (*)    RDW 18.1 (*)    Platelets 139 (*)    Lymphs Abs 0.6 (*)    All other components within normal limits  LACTIC ACID, PLASMA  URINALYSIS, ROUTINE W REFLEX MICROSCOPIC     EKG None  Radiology No results found.  Procedures Procedures (including critical care time)  Medications Ordered in ED Medications  sodium chloride  0.9 % bolus 1,000 mL (has no administration in time range)  HYDROmorphone (DILAUDID) injection 1 mg (has no administration in time range)  colchicine tablet 0.6 mg (has no administration in time range)  vancomycin (VANCOCIN) IVPB 1000 mg/200 mL premix (has no administration in time range)  piperacillin-tazobactam (ZOSYN) IVPB 3.375 g (has no administration in time range)  LORazepam (ATIVAN) injection 0.5 mg (has no administration in time range)     Initial Impression / Assessment and Plan / ED Course  I have reviewed the triage vital signs and the nursing notes.  Pertinent labs & imaging results that were available during my care of the patient were reviewed by me and considered in my medical decision making (see chart for details).     66yM with what I likely gout flare. Hx of DVT but on lovenox. He has exquisite pain with minimal passive ROM of either ankle which is consistent with gout and atypical for DVT.  Will be treated initially with pain meds and colchicine. Additionally, he has a draining area perirectally. Hx of anorectal fistula. Draining spontaneously currently. Will CT to further evaluate. Abx.   Final Clinical Impressions(s) / ED Diagnoses   Final diagnoses:  Acute gout of multiple sites, unspecified cause  Perirectal fistula    ED Discharge Orders    None       Virgel Manifold, MD 12/16/17 [id number]

## 2017-12-16 NOTE — ED Notes (Signed)
Patient transported to CT 

## 2017-12-17 ENCOUNTER — Inpatient Hospital Stay (HOSPITAL_COMMUNITY): Payer: Medicare Other

## 2017-12-17 DIAGNOSIS — M79604 Pain in right leg: Secondary | ICD-10-CM

## 2017-12-17 DIAGNOSIS — I82403 Acute embolism and thrombosis of unspecified deep veins of lower extremity, bilateral: Secondary | ICD-10-CM

## 2017-12-17 DIAGNOSIS — Z515 Encounter for palliative care: Secondary | ICD-10-CM

## 2017-12-17 DIAGNOSIS — K611 Rectal abscess: Secondary | ICD-10-CM

## 2017-12-17 DIAGNOSIS — M109 Gout, unspecified: Secondary | ICD-10-CM

## 2017-12-17 DIAGNOSIS — R52 Pain, unspecified: Secondary | ICD-10-CM

## 2017-12-17 DIAGNOSIS — E440 Moderate protein-calorie malnutrition: Secondary | ICD-10-CM

## 2017-12-17 DIAGNOSIS — D696 Thrombocytopenia, unspecified: Secondary | ICD-10-CM

## 2017-12-17 DIAGNOSIS — M79605 Pain in left leg: Secondary | ICD-10-CM

## 2017-12-17 DIAGNOSIS — E871 Hypo-osmolality and hyponatremia: Secondary | ICD-10-CM

## 2017-12-17 DIAGNOSIS — Z7189 Other specified counseling: Secondary | ICD-10-CM

## 2017-12-17 LAB — URINALYSIS, ROUTINE W REFLEX MICROSCOPIC
BACTERIA UA: NONE SEEN
BILIRUBIN URINE: NEGATIVE
Glucose, UA: NEGATIVE mg/dL
Ketones, ur: 20 mg/dL — AB
LEUKOCYTES UA: NEGATIVE
NITRITE: NEGATIVE
PH: 6 (ref 5.0–8.0)
Protein, ur: NEGATIVE mg/dL
SPECIFIC GRAVITY, URINE: 1.034 — AB (ref 1.005–1.030)
Squamous Epithelial / LPF: NONE SEEN

## 2017-12-17 LAB — COMPREHENSIVE METABOLIC PANEL
ALT: 9 U/L — AB (ref 17–63)
AST: 13 U/L — ABNORMAL LOW (ref 15–41)
Albumin: 2 g/dL — ABNORMAL LOW (ref 3.5–5.0)
Alkaline Phosphatase: 76 U/L (ref 38–126)
Anion gap: 12 (ref 5–15)
BILIRUBIN TOTAL: 1.4 mg/dL — AB (ref 0.3–1.2)
BUN: 10 mg/dL (ref 6–20)
CO2: 23 mmol/L (ref 22–32)
CREATININE: 1.32 mg/dL — AB (ref 0.61–1.24)
Calcium: 7.8 mg/dL — ABNORMAL LOW (ref 8.9–10.3)
Chloride: 99 mmol/L — ABNORMAL LOW (ref 101–111)
GFR, EST NON AFRICAN AMERICAN: 55 mL/min — AB (ref >60)
Glucose, Bld: 111 mg/dL — ABNORMAL HIGH (ref 65–99)
POTASSIUM: 4.3 mmol/L (ref 3.5–5.1)
Sodium: 134 mmol/L — ABNORMAL LOW (ref 135–145)
TOTAL PROTEIN: 5.2 g/dL — AB (ref 6.5–8.1)

## 2017-12-17 LAB — CBC
HCT: 26.6 % — ABNORMAL LOW (ref 39.0–52.0)
Hemoglobin: 8.4 g/dL — ABNORMAL LOW (ref 13.0–17.0)
MCH: 29.3 pg (ref 26.0–34.0)
MCHC: 31.6 g/dL (ref 30.0–36.0)
MCV: 92.7 fL (ref 78.0–100.0)
PLATELETS: 131 10*3/uL — AB (ref 150–400)
RBC: 2.87 MIL/uL — AB (ref 4.22–5.81)
RDW: 18.1 % — AB (ref 11.5–15.5)
WBC: 5.6 10*3/uL (ref 4.0–10.5)

## 2017-12-17 LAB — URIC ACID: Uric Acid, Serum: 6.1 mg/dL (ref 4.4–7.6)

## 2017-12-17 MED ORDER — METHYLPREDNISOLONE SODIUM SUCC 125 MG IJ SOLR
60.0000 mg | Freq: Two times a day (BID) | INTRAMUSCULAR | Status: DC
Start: 2017-12-17 — End: 2017-12-20
  Administered 2017-12-17 – 2017-12-20 (×7): 60 mg via INTRAVENOUS
  Filled 2017-12-17 (×7): qty 2

## 2017-12-17 MED ORDER — SODIUM CHLORIDE 0.9 % IV SOLN
INTRAVENOUS | Status: DC
  Administered 2017-12-17 – 2017-12-19 (×3): via INTRAVENOUS

## 2017-12-17 MED ORDER — VANCOMYCIN HCL 10 G IV SOLR
1500.0000 mg | Freq: Once | INTRAVENOUS | Status: AC
  Administered 2017-12-17: 1500 mg via INTRAVENOUS
  Filled 2017-12-17: qty 1500

## 2017-12-17 MED ORDER — VANCOMYCIN HCL IN DEXTROSE 1-5 GM/200ML-% IV SOLN
1000.0000 mg | INTRAVENOUS | Status: DC
  Administered 2017-12-18: 1000 mg via INTRAVENOUS
  Filled 2017-12-17: qty 200

## 2017-12-17 MED ORDER — HYDROMORPHONE HCL 1 MG/ML IJ SOLN
1.0000 mg | INTRAMUSCULAR | Status: DC | PRN
  Administered 2017-12-17 – 2017-12-20 (×27): 1 mg via INTRAVENOUS
  Filled 2017-12-17 (×28): qty 1

## 2017-12-17 MED ORDER — PIPERACILLIN-TAZOBACTAM 3.375 G IVPB
3.3750 g | Freq: Three times a day (TID) | INTRAVENOUS | Status: DC
  Administered 2017-12-17 – 2017-12-19 (×6): 3.375 g via INTRAVENOUS
  Filled 2017-12-17 (×5): qty 50

## 2017-12-17 NOTE — Progress Notes (Signed)
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  The patient was recently discharged from the hospital after a stay from 11/28/17 through 12/02/17 for gouty exacerbation of his right upper extremity.  He was discharged on OxyContin 20 mg twice daily and oxycodone 5 mg every 2 hours as needed breakthrough pain.  The patient denies any fevers, chills, chest pain, worsening shortness of breath, nausea, vomiting, diarrhea.  He has having difficulty bearing weight secondary to pain in his ankles.  He recently saw his oncologist, [name], on 12/06/17, and the decision was made to continue on full scope of care with chemotherapy and defer hospice.  Because of the patient's uncontrolled pain, he was admitted for pain control. In addition, the patient has been complaining of increasing drainage from his rectal area.  CT of the abdomen and pelvis showed right lateral rectal wall thickening with adjacent infiltrative changes extending into the peri-anal region and right buttock concerning for perirectal abscess.  Patient was started on Zosyn and vancomycin.  Assessment/Plan: Uncontrolled pain secondary to gouty arthritis flare -check uric acid -start IV dilaudid 1 mg q 2 hours prn pain -may need PCA pump if remains uncontrolled -continue home oxycontin bid -start IV solumedrol  Perirectal abscess -Surgery consult -CT abdomen and pelvis discussed with the above--also showed hazy infiltration of the sigmoid colon concerning for diverticulitis versus  tumor -Start vancomycin and Zosyn  Low back pain/leg pain -MRI lumbar spine--no metastatic disease.  No intraspinal mass or severe spinal stenosis.  Multilevel spondylosis noted. -Pain control as discussed above -UA neg for pyuria  Metastatic renal cell carcinoma -Continue cabozantimib per [name] -palliative medicine consult  Bilateral lower extremity DVTs -continue treatment dose lovenox  Hyponatremia -due to volume depletion -doubt SIADH as improved previously with IVF -IVF  Dehydration -IVF -baseline creatinine 1.0-1.3 -am BMP  Thrombocytopenia -intermittent/chronic -monitor of lovenox -am CBC   Disposition Plan:   Home in 2-3 days  Family Communication:  No Family at bedside  Consultants:  Palliative medicine  Code Status:   DNR  DVT Prophylaxis:  Lovenox   Procedures: As Listed in Progress Note Above  Antibiotics: None    Subjective: Patient complains of pain in the bilateral ankles and left wrist.  He denies any headache, neck pain, fevers, chills, chest pain, shortness breath, nausea, vomiting, diarrhea.  There is no dysuria or hematuria.  Objective: Vitals:   12/16/17 2041 12/16/17 2042 12/16/17 2149 12/17/17 0624  BP:   (!) 167/76 (!) 152/72  Pulse: (!) 107 (!) 111 (!) 114 (!) 114  Resp: 17 (!) 33 (!) 24   Temp:   99.2 F (37.3 C) 99.5 F (37.5 C)  TempSrc:   Oral Oral  SpO2: 94% 96% 96% 94%  Weight:   80.2 kg (176 lb 12.9 oz) 81.3 kg (179 lb 3.7 oz)  Height:   5\' 8"  (1.727 m)     Intake/Output Summary (Last 24 hours) at 12/17/2017 1123 Last data filed at 12/17/2017 0816 Gross per 24 hour  Intake 1250 ml  Output 1150 ml  Net 100 ml   Weight change:  Exam:   General:  Pt is alert, follows commands appropriately, not in acute distress  HEENT: No icterus, No thrush, No neck mass, [place]/AT  Cardiovascular: RRR, S1/S2, no rubs, no gallops  Respiratory: CTA bilaterally, no wheezing, no crackles, no rhonchi  Abdomen: Soft/+BS, non  tender, non distended, no guarding  Extremities: Edema in the bilateral ankles.  There is pain in the bilateral first metatarsophalangeal joint area with erythema.  There is tenderness to palpation in that area.  Left wrist synovitis noted.   Data Reviewed: I have personally reviewed following labs and imaging studies Basic Metabolic Panel: Recent Labs  Lab 12/16/17 1519 12/17/17 0551  NA 133* 134*  K 4.1 4.3  CL 99* 99*  CO2 24 23  GLUCOSE 100* 111*  BUN 9 10  CREATININE 1.24 1.32*  CALCIUM 8.0* 7.8*   Liver Function Tests: Recent Labs  Lab 12/16/17 1519 12/17/17 0551  AST 16 13*  ALT 11* 9*  ALKPHOS 87 76  BILITOT 1.2 1.4*  PROT 5.3* 5.2*  ALBUMIN 2.2* 2.0*   Recent Labs  Lab 12/16/17 1546  LIPASE 21   No results for input(s): AMMONIA in the last 168 hours. Coagulation Profile: No results for input(s): INR, PROTIME in the last 168 hours. CBC: Recent Labs  Lab 12/16/17 1519 12/17/17 0551  WBC 5.1 5.6  NEUTROABS 4.2  --   HGB 8.5* 8.4*  HCT 27.3* 26.6*  MCV 92.9 92.7  PLT 139* 131*   Cardiac Enzymes: No results for input(s): CKTOTAL, CKMB, CKMBINDEX, TROPONINI in the last 168 hours. BNP: Invalid input(s): POCBNP CBG: No results for input(s): GLUCAP in the last 168 hours. HbA1C: No results for input(s): HGBA1C in the last 72 hours. Urine analysis:    Component Value Date/Time   COLORURINE YELLOW 12/16/2017 1449   APPEARANCEUR CLEAR 12/16/2017 1449   LABSPEC 1.034 (H) 12/16/2017 1449   PHURINE 6.0 12/16/2017 1449   GLUCOSEU NEGATIVE 12/16/2017 1449   HGBUR MODERATE (A) 12/16/2017 1449   BILIRUBINUR NEGATIVE 12/16/2017 1449   KETONESUR 20 (A) 12/16/2017 1449   PROTEINUR NEGATIVE 12/16/2017 1449   UROBILINOGEN 4.0 (H) 02/11/2015 1409   NITRITE NEGATIVE 12/16/2017 1449   LEUKOCYTESUR NEGATIVE 12/16/2017 1449   Sepsis Labs: @LABRCNTIP (procalcitonin:4,lacticidven:4) )No results found for this or any previous visit (from the past 240 hour(s)).    Scheduled Meds: . allopurinol  100 mg Oral BID  . cabozantinib  40 mg Oral Daily  . enoxaparin  120 mg Subcutaneous Q24H  . feeding supplement (ENSURE ENLIVE)  237 mL Oral BID BM  . feeding supplement (PRO-STAT SUGAR FREE 64)  30 mL Oral BID  . methylPREDNISolone (SOLU-MEDROL) injection  60 mg Intravenous Q12H  . pantoprazole  40 mg Oral Daily   Continuous Infusions: . piperacillin-tazobactam (ZOSYN)  IV      Procedures/Studies: Mr Lumbar Spine Wo Contrast  Result Date: 12/17/2017 CLINICAL DATA:  Low back and BILATERAL leg pain. LEFT renal mass, appears represent metastatic renal cell carcinoma. Pancreatic masses, question metastases. Best obtainable images due to patient condition. EXAM: MRI LUMBAR SPINE WITHOUT CONTRAST TECHNIQUE: Multiplanar, multisequence MR imaging of the lumbar spine was performed. No intravenous contrast was administered. COMPARISON:  CT abdomen pelvis 12/16/2017. FINDINGS: Significant motion degradation. Small or subtle lesions could be overlooked. No contrast was given. Segmentation: Five lumbar type vertebral bodies. Alignment: Trace anterolisthesis L4-5 is facet mediated, 1-2 mm. Otherwise anatomic. Mid lumbar levoconvex scoliosis of a mild nature. Vertebrae:  No  fracture, evidence of discitis, or bone lesion. Conus medullaris and cauda equina: Conus extends to the L1 level. Conus and cauda equina appear normal. Paraspinal and other soft tissues: Unable to assess. Disc levels: L1-L2:  Normal. L2-L3:  Normal. L3-L4:  Annular bulge.  No impingement. L4-L5: Trace anterolisthesis. Central protrusion. Posterior element hypertrophy. Mild stenosis. No definite impingement. L5-S1: Advanced disc space narrowing. Osseous spurring with annular bulge. Facet arthropathy. No subarticular zone narrowing. There is BILATERAL foraminal narrowing, related to loss of interspace height, bony overgrowth, and bulging disc material. This is worse on the LEFT. It is possible that there is LEFT  L5 nerve root impingement. IMPRESSION: Motion degraded examination demonstrating no definite lumbar vertebral body metastatic disease, intraspinal mass lesion, or severe spinal stenosis. Multilevel spondylosis most pronounced at L4-5 and L5-S1. Advanced disc space narrowing at L5-S1 with spurring could be accompanied by symptomatic foraminal narrowing on the LEFT. Electronically Signed   By: Staci Righter M.D.   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 Sagittal and coronal MPR images reconstructed from axial data set. CONTRAST:  133mL ISOVUE-300 IOPAMIDOL (ISOVUE-300) INJECTION 61% IV. No oral contrast. COMPARISON:  11/28/2017 FINDINGS: Lower chest: Bibasilar atelectasis Hepatobiliary: Distended gallbladder little changed. 7 mm cyst LEFT lobe image 20. Liver otherwise unremarkable. Pancreas: Mass lesion at proximal tail of pancreas 4.4 x 3.9 x 5.3 cm, centrally necrotic with peripheral enhancement. Additional mass at pancreatic head 2.6 x 1.8 x 2.5 cm. Findings are consistent with neoplasms/metastases. Spleen: Unremarkable.  Small splenule. Adrenals/Urinary Tract: Adrenal glands normal appearance. Cyst at medial aspect upper pole LEFT kidney 3.5 x 3.8 cm image 28. Large irregular enhancing neoplasm at upper pole LEFT kidney 5.1 x 4.4 x 4.9 cm compatible with neoplasm, with tumor extending into the LEFT renal vein. Tiny cyst at inferior pole LEFT kidney. Prior RIGHT nephrectomy. LEFT ureter and urinary bladder unremarkable. Stomach/Bowel: Stomach decompressed. Few scattered distal colonic  diverticula. Wall thickening of the mid sigmoid colon with hazy infiltration of pericolic tissue planes, could represent diverticulitis or tumor. Minimal RIGHT lateral rectal wall thickening with adjacent infiltrative changes of perirectal fat extending into perianal region and into medial RIGHT buttock compatible with perirectal abscess. Small gas and fluid collection in the RIGHT perirectal space measures 14 mm in greatest size, decreased from the 28 mm greatest diameter on the previous exam. Remaining large and small bowel loops unremarkable. Normal appendix. Vascular/Lymphatic: Vascular structures unremarkable. No adenopathy. Reproductive: Unremarkable prostate gland Other: No free intraperitoneal air or fluid. Tiny umbilical hernia containing fat. Musculoskeletal: Mild degenerative disc disease changes at L5-S1. Degenerative changes of the hip joints bilaterally. No focal bone lesions. IMPRESSION: Again identified large mass at the anterior aspect of the upper LEFT kidney consistent with a renal neoplasm demonstrating evidence of LEFT renal vein invasion/extension. Two pancreatic masses are identified question pancreatic metastases, not significantly changed. Subtle focus of wall thickening and pericolic infiltrative changes at the mid sigmoid colon question diverticulitis though neoplasm not completely excluded, can be assessed by colonoscopy. Tiny RIGHT perirectal abscess 14 mm diameter significantly decreased from the previous exam. Electronically Signed   By: Crist Infante.D.  On: 12/16/2017 17:52   Ct Abdomen Pelvis W Contrast  Result Date: 11/22/2017 CLINICAL DATA:  Acute onset of periumbilical abdominal pain tonight. Patient on Lovenox for DVT. Renal cell carcinoma. EXAM: CT ABDOMEN AND PELVIS WITH CONTRAST TECHNIQUE: Multidetector CT imaging of the abdomen and pelvis was performed using the standard protocol following bolus administration of intravenous contrast. CONTRAST:  192mL ISOVUE-300  IOPAMIDOL (ISOVUE-300) INJECTION 61% COMPARISON:  CT angiography 11/15/2017 FINDINGS: Lower chest: Left pleural thickening.  No frank effusion. Hepatobiliary: 7 mm low-density lesion in the central dome of the liver, unchanged. No new hepatic lesion. Gallbladder physiologically distended, no calcified stone. No biliary dilatation. Pancreas: Low-density lesion in the mid pancreas measures 3.9 x 3.6 cm. Pancreatic head lesion measures 13 x 17 mm. No ductal dilatation or peripancreatic stranding. Spleen: Prominent size spanning 13.9 cm. Adrenals/Urinary Tract: No dominant adrenal nodule. Again seen heterogeneous enhancing left renal lesion measuring approximately 5.3 x 4 cm extending into the left renal vein. No evidence of hemorrhage. No hydronephrosis. Simple cyst arises from the upper kidney measuring 3.7 cm. Post right nephrectomy. Urinary bladder is physiologically distended. Stomach/Bowel: Small hiatal hernia. Stomach physiologically distended. No bowel wall thickening, inflammatory change or obstruction. Normal appendix. Distal colonic diverticulosis without diverticulitis. Perianal air in the right tracking into the perineum is unchanged from the November 2018 CT. Vascular/Lymphatic: Filling defects within right common femoral and bilateral superficial femoral veins consistent with known DVT. No interval progression. No new vascular finding. Normal caliber abdominal aorta. Reproductive: Prostatic calcifications. Other: No ascites or free air.  No intra-abdominal abscess. Musculoskeletal: There are no acute or suspicious osseous abnormalities. IMPRESSION: 1. No acute abnormality. 2. Unchanged left renal mass with invasion of the left renal vein. Unchanged pancreatic body and pancreatic head lesions, potentially metastasis. 3. DVT in the right common femoral bilateral superficial femoral veins, without significant progression from recent CT. 4. Mild colonic diverticulosis without diverticulitis. Mild  splenomegaly. Electronically Signed   By: Jeb Levering M.D.   On: 11/22/2017 06:09   Ct Renal Stone Study  Result Date: 11/28/2017 CLINICAL DATA:  Acute left flank pain. EXAM: CT ABDOMEN AND PELVIS WITHOUT CONTRAST TECHNIQUE: Multidetector CT imaging of the abdomen and pelvis was performed following the standard protocol without IV contrast. COMPARISON:  CT scan of November 22, 2017. FINDINGS: Lower chest: No acute abnormality. Hepatobiliary: Minimal cholelithiasis may be present. No focal abnormality is seen in the liver on these unenhanced images. No biliary dilatation is noted. Pancreas: Grossly stable appearance and size of large mass seen in pancreatic body and smaller mass seen in pancreatic head, concerning for malignancy or metastatic disease. Spleen: Normal in size without focal abnormality. Adrenals/Urinary Tract: Adrenal glands are unremarkable. Status post right nephrectomy. Continued presence of large left renal mass consistent with renal cell carcinoma, with probable extension into the left renal vein. Urinary bladder is unremarkable. No hydronephrosis or renal obstruction is noted. No renal or ureteral calculi are no Stomach/Bowel: Stomach is within normal limits. Appendix appears normal. No evidence of bowel wall thickening, distention, or inflammatory changes. Sigmoid diverticulosis is noted without inflammation. Vascular/Lymphatic: No significant vascular findings are present. DVT described on prior exam cannot be assessed currently due to lack of intravenous contrast. No enlarged abdominal or pelvic lymph nodes. Reproductive: Prostate is unremarkable. Other: No abdominal wall hernia or abnormality. No abdominopelvic ascites. Musculoskeletal: No acute or significant osseous findings. IMPRESSION: Grossly stable appearance of large left renal mass is noted consistent with renal cell carcinoma, with probable extension into the  left renal vein as described on prior exam. Status post right  nephrectomy. No hydronephrosis or renal obstruction is noted. No renal or ureteral calculi are noted. Stable appearance of pancreatic masses as described above, consistent with malignancy or metastatic disease. Probable minimal cholelithiasis without inflammation. Sigmoid diverticulosis without inflammation. Electronically Signed   By: Marijo Conception, M.D.   On: 11/28/2017 10:40    Orson Eva, DO  [facility] Pager [phone]  If 7PM-7AM, please contact night-coverage [email or web address] Password [email or web address] 12/17/2017, 11:23 AM   LOS: 1 day

## 2017-12-17 NOTE — Progress Notes (Signed)
Pharmacy Antibiotic Note  Jeffrey Trujillo is a 67 y.o. male admitted on 12/16/2017 with wound infection.  Pharmacy has been consulted for Vancomycin dosing.  Plan: Vancomycin 1500mg  x 1 then 1000mg  IV q24hrs Monitor labs, progress, c/s  Height: 5\' 8"  (172.7 cm) Weight: 179 lb 3.7 oz (81.3 kg) IBW/kg (Calculated) : 68.4  Temp (24hrs), Avg:99.1 F (37.3 C), Min:98.8 F (37.1 C), Max:99.5 F (37.5 C)  Recent Labs  Lab 12/16/17 1519 12/16/17 1521 12/17/17 0551  WBC 5.1  --  5.6  CREATININE 1.24  --  1.32*  LATICACIDVEN  --  0.8  --     Estimated Creatinine Clearance: 53.3 mL/min (A) (by C-G formula based on SCr of 1.32 mg/dL (H)).    Allergies  Allergen Reactions  . Nsaids Other (See Comments)    Patient only has one kidney.   Marland Kitchen Amoxicillin Nausea And Vomiting  . Zofran [Ondansetron Hcl] Hives  . Demerol [Meperidine] Rash   Antimicrobials this admission: Vancomycin 4/2 >>   Dose adjustments this admission:  MICRO: No results found for this or any previous visit (from the past 240 hour(s)).  Thank you for allowing pharmacy to be a part of this patient's care.  Hart Robinsons A 12/17/2017 1:03 PM

## 2017-12-17 NOTE — Progress Notes (Signed)
Daily Progress Note   Patient Name: Jeffrey Trujillo       Date: 12/17/2017 DOB: 07-27-1951  Age: 67 y.o. MRN#: [id number] Attending Physician: Orson Eva, MD Primary Care Physician: [facility] Date: 12/16/2017  Reason for Consultation/Follow-up: Establishing goals of care and Psychosocial/spiritual support  Subjective: Mr. Porte is resting quietly in bed.  He will briefly make but not keep eye contact.  Present today at bedside is his wife [name], and nursing staff.  We talked about his gouty flare and the swelling in his legs.  Mrs. Venable states that she had attempted compression hose for her husband but could not have them fitted.  I share that at this point, Mr. Steiner will be expected to continue to have swelling in his legs.  [name] states that both of his brothers had cancer, and their legs swelled, also.  I ask if the leg swelling occurred when time is short, she nods affirmatively.  We talked about pain management in detail.  [name] states that oncologist, [name], is willing to continue prescribing pain medication.  We reviewed [name] note from the last visit.  [facility] states that [name] shares that he is not ready for hospice at this point.  I share that [name] note reads that because Mr. Lieurance desires to continue with chemotherapy, he is not ready for hospice.  Mr. Mierzejewski states that he wants to live as long as possible.  I encouraged the family that often, people live longer with hospice.  Conference with Dr. Carles Collet related to plan of care and disposition.  Length of Stay: 1  Current Medications: Scheduled Meds:  . allopurinol  100 mg Oral BID  . cabozantinib  40 mg Oral Daily  . enoxaparin  120 mg Subcutaneous Q24H  .  feeding supplement (ENSURE ENLIVE)  237 mL Oral BID BM  . feeding supplement (PRO-STAT SUGAR FREE 64)  30 mL Oral BID  . methylPREDNISolone (SOLU-MEDROL) injection  60 mg Intravenous Q12H  . pantoprazole  40 mg Oral Daily    Continuous Infusions: . piperacillin-tazobactam (ZOSYN)  IV 3.375 g (12/17/17 1140)    PRN Meds: acetaminophen **OR** acetaminophen, bisacodyl, HYDROmorphone (DILAUDID) injection  Physical Exam  Constitutional: He is oriented to person, place, and time. He appears distressed.  Makes  and briefly keeps eye contact  HENT:  Head: Normocephalic and atraumatic.  Cardiovascular: Normal rate.  Pulmonary/Chest: Effort normal. No respiratory distress.  Abdominal: Soft. He exhibits no distension.  Musculoskeletal:  Left hand swollen, bilateral lower extremities swollen  Neurological: He is alert and oriented to person, place, and time.  Skin: Skin is warm and dry.  Psychiatric:  Grimacing, vocal about his pain  Nursing note and vitals reviewed.           Vital Signs: BP (!) 152/72 (BP Location: Right Arm)   Pulse (!) 114   Temp 99.5 F (37.5 C) (Oral)   Resp (!) 24   Ht 5\' 8"  (1.727 m)   Wt 81.3 kg (179 lb 3.7 oz)   SpO2 94%   BMI 27.25 kg/m  SpO2: SpO2: 94 % O2 Device: O2 Device: Room Air O2 Flow Rate:    Intake/output summary:   Intake/Output Summary (Last 24 hours) at 12/17/2017 1230 Last data filed at 12/17/2017 0816 Gross per 24 hour  Intake 1370 ml  Output 1150 ml  Net 220 ml   LBM: Last BM Date: 12/15/17 Baseline Weight: Weight: 86.2 kg (190 lb) Most recent weight: Weight: 81.3 kg (179 lb 3.7 oz)       Palliative Assessment/Data:      Patient Active Problem List   Diagnosis Date Noted  . Uncontrolled pain 12/17/2017  . Acute gouty arthritis 12/17/2017  . Leg pain 12/16/2017  . Back pain 12/16/2017  . Hyponatremia 12/16/2017  . Encounter for hospice care discussion   . Palliative care by specialist   . Renal cell carcinoma ([place])  11/28/2017  . Acute pancreatitis 11/22/2017  . DVT, bilateral lower limbs ([place]) 11/15/2017  . History of renal cell cancer   . Chronic neck pain   . Goals of care, counseling/discussion   . Palliative care encounter   . Encephalopathy acute 11/15/2016  . Fever 12/07/2015  . Gouty arthritis 12/07/2015  . Fall   . Pyrexia   . Acute gout 11/15/2015  . Perirectal abscess 11/12/2015  . Hypokalemia 11/12/2015  . Anemia of chronic disease 11/12/2015  . Gallstones   . Sinus bradycardia 02/14/2015  . Malnutrition of moderate degree ([place]) 02/12/2015  . Elevated LFTs 02/12/2015  . Abdominal pain, right upper quadrant 02/12/2015  . Acute kidney injury ([place]) 02/12/2015  . Cholecystitis 02/11/2015  . Hiatal hernia   . Chest pain 04/15/2014  . PUD (peptic ulcer disease) 04/21/2013  . Melena 03/10/2013  . Flank pain 03/10/2013  . Leukopenia 03/10/2013  . Thrombocytopenia ([place]) 03/10/2013  . Abdominal abscess 03/10/2013  . Anemia 03/10/2013  . ETOH abuse 03/10/2013  . Bradycardia 03/10/2013  . Barrett's esophagus 01/04/2013  . Renal cell cancer ([place]) 09/25/2012  . Lower GI bleed 09/25/2012  . Anemia due to blood loss 09/25/2012  . Acute gout of right foot 09/25/2012  . DJD (degenerative joint disease) 09/25/2012  . Pancreatic mass 08/04/2012  . Acute strain of neck muscle 06/05/2011  . RECTAL BLEEDING 10/20/2009  . NAUSEA WITH VOMITING 10/20/2009  . DIARRHEA, BLOODY 10/20/2009    Palliative Care Assessment & Plan   Patient Profile: 67 y.o.malewith past medical history of metastatic renal Cell carcinoma with metastatic burden to pancreas,stage III chronic kidney disease, peptic ulcer disease, diverticulosis of colon, chronic pain, Barrett's esophagus, anemia of chronic renal disease, chronic painadmitted on 4/1/2019with Left-sided flank pain in setting of renal cell carcinoma.  Assessment: Renal Cell carcinoma; follow-up with oncologist Shadad.  Patient elects to continue  with chemotherapy, declines hospice at this time. Gout flare; left hand gout flare.  Right hand gout flare on last palliative follow-up.  Patient would benefit from better gout controlled  Recommendations/Plan:  Continue to treat the treatable.  Continue discussions related to the benefits of hospice.  Recommend increasing gout medications.  Pain management by oncology.  Goals of Care and Additional Recommendations:  Limitations on Scope of Treatment: Treat the treatable but no extraordinary measures  Code Status:    Code Status Orders  (From admission, onward)        Start     Ordered   12/16/17 2115  Do not attempt resuscitation (DNR)  Continuous    Question Answer Comment  In the event of cardiac or respiratory ARREST Do not call a "code blue"   In the event of cardiac or respiratory ARREST Do not perform Intubation, CPR, defibrillation or ACLS   In the event of cardiac or respiratory ARREST Use medication by any route, position, wound care, and other measures to relive pain and suffering. May use oxygen, suction and manual treatment of airway obstruction as needed for comfort.      12/16/17 2114    Code Status History    Date Active Date Inactive Code Status Order ID Comments User Context   11/28/2017 1314 12/02/2017 1616 DNR [id number]  Heath Lark D, DO ED   11/22/2017 1243 11/26/2017 1602 Full Code [id number]  Isaac Bliss, Rayford Halsted, MD Inpatient   11/15/2017 2026 11/17/2017 1534 DNR [id number]  Vianne Bulls, MD ED   11/15/2016 1955 11/20/2016 1453 DNR [id number]  Karmen Bongo, MD Inpatient   12/07/2015 2211 12/09/2015 1707 Full Code [id number]  Phillips Grout, MD Inpatient   11/12/2015 1838 11/18/2015 2102 Full Code [id number]  Erline Hau, MD Inpatient   02/11/2015 2337 02/15/2015 1556 Full Code [id number]  Truett Mainland, DO Inpatient   04/14/2014 1904 04/15/2014 2315 Full Code [id number]  Doree Albee, MD ED   03/10/2013 1151 03/12/2013 1741 Full Code [id number]   Radene Gunning, NP ED   09/25/2012 0732 09/30/2012 2007 Full Code [id number]  Yehuda Budd, RN Inpatient    Advance Directive Documentation     Most Recent Value  Type of Advance Directive  Living will  Pre-existing out of facility DNR order (yellow form or pink MOST form)  -  "MOST" Form in Place?  -       Prognosis:   < 6 months or less would not be surprising based on metastatic cancer burden, with increasing pain, and bilateral lower extremity swelling.  Discharge Planning:  Home with [facility] was discussed with nursing staff, Dr. Carles Collet.  Thank you for allowing the Palliative Medicine Team to assist in the care of this patient.   Time In: 1000 Time Out: 1045 Total Time 45 minutes Prolonged Time Billed  yes       Greater than 50%  of this time was spent counseling and coordinating care related to the above assessment and plan.  Drue Novel, NP  Please contact Palliative Medicine Team phone at [phone] for questions and concerns.

## 2017-12-18 DIAGNOSIS — K615 Supralevator abscess: Secondary | ICD-10-CM

## 2017-12-18 DIAGNOSIS — K604 Rectal fistula: Secondary | ICD-10-CM

## 2017-12-18 DIAGNOSIS — C649 Malignant neoplasm of unspecified kidney, except renal pelvis: Secondary | ICD-10-CM

## 2017-12-18 MED ORDER — METHYLPREDNISOLONE SODIUM SUCC 125 MG IJ SOLR
60.0000 mg | Freq: Once | INTRAMUSCULAR | Status: AC
  Administered 2017-12-18: 60 mg via INTRAVENOUS
  Filled 2017-12-18: qty 2

## 2017-12-18 NOTE — Progress Notes (Signed)
Daily Progress Note   Patient Name: Jeffrey Trujillo       Date: 12/18/2017 DOB: Apr 23, 1951  Age: 67 y.o. MRN#: [id number] Attending Physician: Jeffrey Trujillo Primary Care Physician: Jeffrey Trujillo Admit Date: 12/16/2017  Reason for Consultation/Follow-up: Establishing goals of care and Psychosocial/spiritual support  Subjective: Jeffrey Trujillo is resting quietly in bed.  He greets me making an briefly keeping eye contact.  His wife enters a few minutes later.  We talked about his gout flare which he states has improved.  He shows me is that he is now able to raise his arm and move his fingers.  We talked about working with PCP for better gout control.  We talked about swelling in his legs.  Jeffrey Trujillo asks if they have reduced, I share that I feel they are the same or may be even more swollen.  We talked about possible progression of DVTs, that we have him fully anticoagulated at this time.  Jeffrey Trujillo states that it has been quite some time since he had ultrasound of DVTs to evaluate if they are shrinking or enlarging.  I share that as long as he has active cancer, I do not anticipate he will be able to be free of the DVTs.  We talked about palliative care outside of the hospital.  I share that he would likely qualify for palliative care with [facility].  We talked about what they may be able to do for him at home at no cost.  I encouraged family to have a face-to-face meeting with the provider to hear from them about their services.  Jeffrey Trujillo asks if Jeffrey Trujillo would let him go from treatment if he accepted palliative care.  I share that this is not likely, but it is important that he has Jeffrey Trujillo agreement.  Jeffrey Trujillo states to his wife that they need to investigate  palliative services.  Length of Stay: 2  Current Medications: Scheduled Meds:  . allopurinol  100 mg Oral BID  . cabozantinib  40 mg Oral Daily  . enoxaparin  120 mg Subcutaneous Q24H  . feeding supplement (ENSURE ENLIVE)  237 mL Oral BID BM  . feeding supplement (PRO-STAT SUGAR FREE 64)  30 mL Oral BID  . methylPREDNISolone (SOLU-MEDROL) injection  60 mg Intravenous Q12H  .  pantoprazole  40 mg Oral Daily    Continuous Infusions: . sodium chloride 75 mL/hr at 12/18/17 1037  . piperacillin-tazobactam (ZOSYN)  IV 3.375 g (12/18/17 1154)  . vancomycin      PRN Meds: acetaminophen **OR** acetaminophen, bisacodyl, HYDROmorphone (DILAUDID) injection  Physical Exam  Constitutional: He is oriented to person, place, and time. No distress.  HENT:  Head: Atraumatic.  Cardiovascular: Normal rate.  Pulmonary/Chest: Effort normal. No respiratory distress.  Abdominal: Soft. He exhibits no distension.  Musculoskeletal: He exhibits edema.  Neurological: He is alert and oriented to person, place, and time.  Left hand swollen  Skin: Skin is warm and dry.  Psychiatric: He has a normal mood and affect.  Calm and cooperative  Nursing note and vitals reviewed.           Vital Signs: BP (!) 148/73 (BP Location: Right Arm)   Pulse (!) 50   Temp (!) 97.4 F (36.3 C) (Oral)   Resp 20   Ht 5\' 8"  (1.727 m)   Wt 79.7 kg (175 lb 11.3 oz)   SpO2 100%   BMI 26.72 kg/m  SpO2: SpO2: 100 % O2 Device: O2 Device: Room Air O2 Flow Rate:    Intake/output summary:   Intake/Output Summary (Last 24 hours) at 12/18/2017 1401 Last data filed at 12/18/2017 0300 Gross per 24 hour  Intake 671.25 ml  Output 350 ml  Net 321.25 ml   LBM: Last BM Date: 12/17/17 Baseline Weight: Weight: 86.2 kg (190 lb) Most recent weight: Weight: 79.7 kg (175 lb 11.3 oz)       Palliative Assessment/Data:      Patient Active Problem List   Diagnosis Date Noted  . Perirectal fistula   . Uncontrolled pain  12/17/2017  . Acute gouty arthritis 12/17/2017  . Leg pain 12/16/2017  . Back pain 12/16/2017  . Hyponatremia 12/16/2017  . Encounter for hospice care discussion   . Palliative care by specialist   . Renal cell carcinoma ([place]) 11/28/2017  . Acute pancreatitis 11/22/2017  . DVT, bilateral lower limbs ([place]) 11/15/2017  . History of renal cell cancer   . Chronic neck pain   . Goals of care, counseling/discussion   . Palliative care encounter   . Encephalopathy acute 11/15/2016  . Fever 12/07/2015  . Gouty arthritis 12/07/2015  . Fall   . Pyrexia   . Acute gout 11/15/2015  . Supralevator abscess 11/12/2015  . Hypokalemia 11/12/2015  . Anemia of chronic disease 11/12/2015  . Gallstones   . Sinus bradycardia 02/14/2015  . Malnutrition of moderate degree ([place]) 02/12/2015  . Elevated LFTs 02/12/2015  . Abdominal pain, right upper quadrant 02/12/2015  . Acute kidney injury ([place]) 02/12/2015  . Cholecystitis 02/11/2015  . Hiatal hernia   . Chest pain 04/15/2014  . PUD (peptic ulcer disease) 04/21/2013  . Melena 03/10/2013  . Flank pain 03/10/2013  . Leukopenia 03/10/2013  . Thrombocytopenia ([place]) 03/10/2013  . Abdominal abscess 03/10/2013  . Anemia 03/10/2013  . ETOH abuse 03/10/2013  . Bradycardia 03/10/2013  . Barrett's esophagus 01/04/2013  . Renal cell cancer ([place]) 09/25/2012  . Lower GI bleed 09/25/2012  . Anemia due to blood loss 09/25/2012  . Acute gout of right foot 09/25/2012  . DJD (degenerative joint disease) 09/25/2012  . Pancreatic mass 08/04/2012  . Acute strain of neck muscle 06/05/2011  . RECTAL BLEEDING 10/20/2009  . NAUSEA WITH VOMITING 10/20/2009  . DIARRHEA, BLOODY 10/20/2009    Palliative Care Assessment & Plan  Patient Profile: 67 y.o.malewith past medical history of metastatic renal Cell carcinoma with metastatic burden to pancreas,stage III chronic kidney disease, peptic ulcer disease, diverticulosis of colon, chronic pain, Barrett's  esophagus, anemia of chronic renal disease, chronic painadmitted on 4/1/2019with Left-sided flank pain in setting of renal cell carcinoma.  Assessment: Renal Cell carcinoma; follow-up with oncologist Jeffrey Trujillo.  Patient elects to continue with chemotherapy, declines hospice at this time. Gout flare; left hand gout flare, some improvement today.    Able to move hand without extreme pain.  Right hand gout flare on last palliative follow-up.  Patient would benefit from better gout control.   Recommendations/Plan:  Continue to treat the treatable.  Continue discussions related to the benefits of hospice.  Recommend increasing gout medications.  Pain management by oncology.  Goals of Care and Additional Recommendations:  Limitations on Scope of Treatment: Treat the treatable, no CPR, no intubation.  Considering outpatient palliative care with [facility].  Code Status:    Code Status Orders  (From admission, onward)        Start     Ordered   12/16/17 2115  Do not attempt resuscitation (DNR)  Continuous    Question Answer Comment  In the event of cardiac or respiratory ARREST Do not call a "code blue"   In the event of cardiac or respiratory ARREST Do not perform Intubation, CPR, defibrillation or ACLS   In the event of cardiac or respiratory ARREST Use medication by any route, position, wound care, and other measures to relive pain and suffering. May use oxygen, suction and manual treatment of airway obstruction as needed for comfort.      12/16/17 2114    Code Status History    Date Active Date Inactive Code Status Order ID Comments User Context   11/28/2017 1314 12/02/2017 1616 DNR [id number]  Heath Lark D, DO ED   11/22/2017 1243 11/26/2017 1602 Full Code [id number]  Jeffrey Trujillo, Rayford Halsted, Trujillo Inpatient   11/15/2017 2026 11/17/2017 1534 DNR [id number]  Vianne Bulls, Trujillo ED   11/15/2016 1955 11/20/2016 1453 DNR [id number]  Karmen Bongo, Trujillo Inpatient   12/07/2015 2211 12/09/2015  1707 Full Code [id number]  Phillips Grout, Trujillo Inpatient   11/12/2015 1838 11/18/2015 2102 Full Code [id number]  Erline Hau, Trujillo Inpatient   02/11/2015 2337 02/15/2015 1556 Full Code [id number]  Truett Mainland, DO Inpatient   04/14/2014 1904 04/15/2014 2315 Full Code [id number]  Doree Albee, Trujillo ED   03/10/2013 1151 03/12/2013 1741 Full Code [id number]  Radene Gunning, NP ED   09/25/2012 0732 09/30/2012 2007 Full Code [id number]  Yehuda Budd, RN Inpatient    Advance Directive Documentation     Most Recent Value  Type of Advance Directive  Living will  Pre-existing out of facility DNR order (yellow form or pink MOST form)  -  "MOST" Form in Place?  -       Prognosis:   < 6 months or less would not be surprising based on metastatic cancer burden, with increasing pain, and bilateral lower extremity swelling.  Discharge Planning:  Home with the benefits of palliative care of [facility] hopefully.  Care plan was discussed with nursing staff, case management, Dr. Jerilee Hoh.  Thank you for allowing the Palliative Medicine Team to assist in the care of this patient.   Time In: 0950 Time Out: 1030 Total Time 40 minutes Prolonged Time Billed  no       Greater than 50%  of  this time was spent counseling and coordinating care related to the above assessment and plan.  Drue Novel, NP  Please contact Palliative Medicine Team phone at [phone] for questions and concerns.

## 2017-12-18 NOTE — Progress Notes (Signed)
PROGRESS NOTE    Jeffrey Trujillo  XBJ:[id number] DOB: 10-09-1950 DOA: 12/16/2017 PCP: Abran Richard, MD     Brief Narrative:  67 year old man admitted from home on 4/1 due to 1 day history of worsening bilateral ankle pain and left upper extremity pain and swelling.  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  The patient was recently discharged from the hospital on March/18th after being treated for a gout exacerbation of his right upper extremity.  Admission was requested for pain management.  Also CT scan of the abdomen showed right lateral rectal wall thickening concerning for perirectal abscess, broad-spectrum antibiotics were started and surgical consultation requested.   Assessment & Plan:   Principal Problem:   Leg pain Active Problems:   Thrombocytopenia ([place])   Anemia   Malnutrition of moderate degree (HCC)   Supralevator abscess   DVT, bilateral lower limbs (HCC)   Renal cell carcinoma (HCC)   Back pain   Hyponatremia   Uncontrolled pain   Acute gouty arthritis   Perirectal fistula   Uncontrolled pain secondary to gouty arthritis flare -Continue IV Solu-Medrol at current dose, in addition we will give an extra dose of Solu-Medrol today. -Can continue Dilaudid as needed. -We will ask for assistance from palliative care and converting his IV narcotics over to oral.  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 -Seen by surgery, Dr. Constance Haw.  We are trying a conservative approach at this time. -Plan will be to continue antibiotic course with the hopes that this chronic track will become less  inflamed. -Anticipate transitioning over to oral antibiotics over the next 24 hours if he does well.  Metastatic renal cell carcinoma -Continue cabozantinib -Outpatient follow-up with his oncologist, 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 -Suspect acute component due to dehydration and possibly high-dose steroid use for his gout. -Continue IV fluids. -Recheck renal function in a.m.     DVT prophylaxis: Full dose Lovenox Code Status: DNR Family Communication: Wife at bedside updated on plan of care and all questions answered Disposition Plan: Hope for discharge home over next 24-48 hours with improvement in pain  Consultants:   Palliative care  Procedures:   None  Antimicrobials:  Anti-infectives (From admission, onward)   Start     Dose/Rate Route Frequency Ordered Stop   12/18/17 1500  vancomycin (VANCOCIN) IVPB 1000 mg/200 mL premix     1,000 mg 200 mL/hr over 60 Minutes Intravenous Every 24 hours 12/17/17 1302     12/17/17 1500  vancomycin (VANCOCIN) 1,500 mg in sodium chloride 0.9 % 500 mL IVPB     1,500 mg 250 mL/hr over 120 Minutes Intravenous  Once 12/17/17 1302 12/17/17 1843   12/17/17 0800  piperacillin-tazobactam (ZOSYN) IVPB 3.375 g     3.375 g 12.5 mL/hr over 240 Minutes Intravenous Every 8 hours 12/17/17 0735     12/16/17 1500  vancomycin (VANCOCIN) IVPB 1000  mg/200 mL premix     1,000 mg 200 mL/hr over 60 Minutes Intravenous  Once 12/16/17 1449 12/16/17 1755   12/16/17 1500  piperacillin-tazobactam (ZOSYN) IVPB 3.375 g     3.375 g 100 mL/hr over 30 Minutes Intravenous  Once 12/16/17 1449 12/16/17 1558       Subjective: Still with significant pain of his left hand, pain in his calves  and ankles has significantly improved  Objective: Vitals:   12/17/17 1418 12/17/17 2008 12/18/17 0501 12/18/17 1638  BP: (!) 142/68 (!) 153/81 (!) 148/73 124/65  Pulse: 95 77 (!) 50 73  Resp: 20   18  Temp: 99.4 F (37.4 C) (!) 97.5 F (36.4 C) (!) 97.4 F (36.3 C) 97.8 F (36.6 C)  TempSrc: Oral Oral Oral Oral  SpO2: 95% 100% 100% 94%  Weight:   79.7 kg (175 lb 11.3 oz)   Height:        Intake/Output Summary (Last 24 hours) at 12/18/2017 1737 Last data filed at 12/18/2017 1531 Gross per 24 hour  Intake 1151.25 ml  Output 250 ml  Net 901.25 ml   Filed Weights   12/16/17 2149 12/17/17 0624 12/18/17 0501  Weight: 80.2 kg (176 lb 12.9 oz) 81.3 kg (179 lb 3.7 oz) 79.7 kg (175 lb 11.3 oz)    Examination:  General exam: Alert, awake, oriented x 3 Respiratory system: Clear to auscultation. Respiratory effort normal. Cardiovascular system:RRR. No murmurs, rubs, gallops. Gastrointestinal system: Abdomen is nondistended, soft and nontender. No organomegaly or masses felt. Normal bowel sounds heard. Central nervous system: Alert and oriented. No focal neurological deficits. Extremities: No C/C/E, +pedal pulses, left hand is edematous and erythematous and very painful to touch Skin: No rashes, lesions or ulcers Psychiatry: Judgement and insight appear normal. Mood & affect appropriate.     Data Reviewed: I have personally reviewed following labs and imaging studies  CBC: Recent Labs  Lab 12/16/17 1519 12/17/17 0551  WBC 5.1 5.6  NEUTROABS 4.2  --   HGB 8.5* 8.4*  HCT 27.3* 26.6*  MCV 92.9 92.7  PLT 139* 673*   Basic Metabolic Panel: Recent Labs  Lab 12/16/17 1519 12/17/17 0551  NA 133* 134*  K 4.1 4.3  CL 99* 99*  CO2 24 23  GLUCOSE 100* 111*  BUN 9 10  CREATININE 1.24 1.32*  CALCIUM 8.0* 7.8*   GFR: Estimated Creatinine Clearance: 53.3 mL/min (A) (by C-G formula based on SCr of 1.32 mg/dL (H)). Liver Function Tests: Recent Labs  Lab 12/16/17 1519  12/17/17 0551  AST 16 13*  ALT 11* 9*  ALKPHOS 87 76  BILITOT 1.2 1.4*  PROT 5.3* 5.2*  ALBUMIN 2.2* 2.0*   Recent Labs  Lab 12/16/17 1546  LIPASE 21   No results for input(s): AMMONIA in the last 168 hours. Coagulation Profile: No results for input(s): INR, PROTIME in the last 168 hours. Cardiac Enzymes: No results for input(s): CKTOTAL, CKMB, CKMBINDEX, TROPONINI in the last 168 hours. BNP (last 3 results) No results for input(s): PROBNP in the last 8760 hours. HbA1C: No results for input(s): HGBA1C in the last 72 hours. CBG: No results for input(s): GLUCAP in the last 168 hours. Lipid Profile: No results for input(s): CHOL, HDL, LDLCALC, TRIG, CHOLHDL, LDLDIRECT in the last 72 hours. Thyroid Function Tests: No results for input(s): TSH, T4TOTAL, FREET4, T3FREE, THYROIDAB in the last 72 hours. Anemia Panel: No results for input(s): VITAMINB12, FOLATE, FERRITIN, TIBC, IRON, RETICCTPCT in the last 72 hours. Urine analysis:  Component Value Date/Time   COLORURINE YELLOW 12/16/2017 1449   APPEARANCEUR CLEAR 12/16/2017 1449   LABSPEC 1.034 (H) 12/16/2017 1449   PHURINE 6.0 12/16/2017 1449   GLUCOSEU NEGATIVE 12/16/2017 1449   HGBUR MODERATE (A) 12/16/2017 1449   BILIRUBINUR NEGATIVE 12/16/2017 1449   KETONESUR 20 (A) 12/16/2017 1449   PROTEINUR NEGATIVE 12/16/2017 1449   UROBILINOGEN 4.0 (H) 02/11/2015 1409   NITRITE NEGATIVE 12/16/2017 1449   LEUKOCYTESUR NEGATIVE 12/16/2017 1449   Sepsis Labs: @LABRCNTIP (procalcitonin:4,lacticidven:4)  )No results found for this or any previous visit (from the past 240 hour(s)).       Radiology Studies: Mr Lumbar Spine Wo Contrast  Result Date: 12/17/2017 CLINICAL DATA:  Low back and BILATERAL leg pain. LEFT renal mass, appears represent metastatic renal cell carcinoma. Pancreatic masses, question metastases. Best obtainable images due to patient condition. EXAM: MRI LUMBAR SPINE WITHOUT CONTRAST TECHNIQUE: Multiplanar,  multisequence MR imaging of the lumbar spine was performed. No intravenous contrast was administered. COMPARISON:  CT abdomen pelvis 12/16/2017. FINDINGS: Significant motion degradation. Small or subtle lesions could be overlooked. No contrast was given. Segmentation: Five lumbar type vertebral bodies. Alignment: Trace anterolisthesis L4-5 is facet mediated, 1-2 mm. Otherwise anatomic. Mid lumbar levoconvex scoliosis of a mild nature. Vertebrae:  No fracture, evidence of discitis, or bone lesion. Conus medullaris and cauda equina: Conus extends to the L1 level. Conus and cauda equina appear normal. Paraspinal and other soft tissues: Unable to assess. Disc levels: L1-L2:  Normal. L2-L3:  Normal. L3-L4:  Annular bulge.  No impingement. L4-L5: Trace anterolisthesis. Central protrusion. Posterior element hypertrophy. Mild stenosis. No definite impingement. L5-S1: Advanced disc space narrowing. Osseous spurring with annular bulge. Facet arthropathy. No subarticular zone narrowing. There is BILATERAL foraminal narrowing, related to loss of interspace height, bony overgrowth, and bulging disc material. This is worse on the LEFT. It is possible that there is LEFT L5 nerve root impingement. IMPRESSION: Motion degraded examination demonstrating no definite lumbar vertebral body metastatic disease, intraspinal mass lesion, or severe spinal stenosis. Multilevel spondylosis most pronounced at L4-5 and L5-S1. Advanced disc space narrowing at L5-S1 with spurring could be accompanied by symptomatic foraminal narrowing on the LEFT. Electronically Signed   By: Staci Righter M.D.   On: 12/17/2017 09:11        Scheduled Meds: . allopurinol  100 mg Oral BID  . cabozantinib  40 mg Oral Daily  . enoxaparin  120 mg Subcutaneous Q24H  . feeding supplement (ENSURE ENLIVE)  237 mL Oral BID BM  . feeding supplement (PRO-STAT SUGAR FREE 64)  30 mL Oral BID  . methylPREDNISolone (SOLU-MEDROL) injection  60 mg Intravenous Q12H  .  pantoprazole  40 mg Oral Daily   Continuous Infusions: . sodium chloride 75 mL/hr at 12/18/17 1037  . piperacillin-tazobactam (ZOSYN)  IV Stopped (12/18/17 1508)  . vancomycin Stopped (12/18/17 1609)     LOS: 2 days    Time spent: 30 minutes. Greater than 50% of this time was spent in direct contact with the patient coordinating care.     Lelon Frohlich, MD [facility] Pager [phone]  If 7PM-7AM, please contact night-coverage [email or web address] Password [facility] 12/18/2017, 5:37 PM

## 2017-12-18 NOTE — Progress Notes (Signed)
Nutrition Brief Note  Patient reported on the Malnutrition Screening Tool (MST) that he had been eating poorly due to a decreased appetite and had recently lost wt unintentionally   Wt Readings from Last 15 Encounters:  12/18/17 175 lb 11.3 oz (79.7 kg)  12/06/17 189 lb 1.6 oz (85.8 kg)  11/28/17 178 lb (80.7 kg)  11/22/17 178 lb 9.2 oz (81 kg)  11/15/17 179 lb 7.3 oz (81.4 kg)  10/29/17 187 lb (84.8 kg)  10/08/17 183 lb (83 kg)  10/03/17 183 lb 12.8 oz (83.4 kg)  09/29/17 186 lb (84.4 kg)  08/26/17 185 lb (83.9 kg)  08/02/17 186 lb 4.8 oz (84.5 kg)  07/27/17 185 lb (83.9 kg)  06/25/17 177 lb 4.8 oz (80.4 kg)  01/31/17 181 lb 9.6 oz (82.4 kg)  12/21/16 180 lb 14.4 oz (82.1 kg)   Pt today states that his reported poor intake is "just how I am, sometime I want it, sometimes I dont". He denies any n/v/d. He occasionally has constipation, but spouse thinks this is because he doesn't eat a lot. He says pain "maybe" having an impact on his diet, but largely this is just his normal pattern.   Weight wise, pt states his UBW is 186 lbs. RD noted that he is currently listed as 175.7 lbs. Both wife and pt were surprised to hear this and do not think this is accurate. Pt/wife report patient being weighed outpatient at 189 within the last couple weeks and patient doesn't feel like he has lost weight. RD attempted to remeasure on bed w/ help of nurse, but scale was not working.    RD stressed importance of adequate intake. He was agreeable to continuing supplements, as long as they were cold.    Given patients PO intake being at baseline and pt report of weight stability. No nutrition interventions warranted at this time. If nutrition issues arise, please consult RD.   Burtis Junes RD, LDN, CNSC Clinical Nutrition Available Tues-Sat via Pager: [phone] 12/18/2017 2:45 PM

## 2017-12-18 NOTE — Consult Note (Signed)
[facility] Consult  Reason for Consult: Perirectal Abscess   Referring Physician: Dr. Carles Collet   Chief Complaint    Fever; Abscess      Jeffrey Trujillo is a 67 y.o. male.   HPI: Jeffrey Trujillo is a 67 yo with metastatic renal cell cancer and multiple admissions for limb pain related to gout. He also recently reports some increased perirectal pain and reports that he has had pain on and off in the this region for years. He is unsure if it drains, but does recall Dr. Marcello Moores 05/2016 placing a drain in the area several years ago.  Since that time, he says the pain has come and gone and that when he cleans himself it will go from being healed to opening up and being tender.   He has had some chills, reported fevers, and CT findings of peri-rectal abscess. He has been on antibiotics but has still had fevers and chills. A repeat CT demonstrated decreasing size of the abscess.    Past Medical History:  Diagnosis Date  . Anal fistula   . Anemia in chronic renal disease   . Barrett's esophagus    by EGD 11/02/2009  . Chronic pain   . CKD (chronic kidney disease), stage III ([place])   . Diverticulosis of colon    by colonoscopy 11/02/2009  . DJD (degenerative joint disease) of cervical spine   . GERD (gastroesophageal reflux disease)   . Gouty arthritis    per pt on 05-16-2016 stable (last bout bilatearl wrist 03/ 2017)  . History of diverticulitis of colon    w/ small perforation , no surgical intervention 06/ 2014  . History of peptic ulcer disease   . Iron deficiency anemia due to chronic blood loss   . 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 Nocturia   . 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 Wears glasses     Past Surgical History:  Procedure Laterality Date  . COLONOSCOPY  Feb 2011   [name]: single anal papilla, pan-colonic diverticula  . COLONOSCOPY  09/25/2012   RMR: Colonic diverticulosis. Suspect diverticular bleeding-hepatic flexure tic-sealed  . ESOPHAGOGASTRODUODENOSCOPY  Feb 2011   [name]: salmon-colored epithelium consistent with Barrett's, nodular antral erosions, small hiatal hernia, negative H.pylori  . ESOPHAGOGASTRODUODENOSCOPY  09/25/2012   OIB:[id number]'V esophagus. Small hiatal hernia. Antral erosions of doubtful clinical significance, no biopsies  . ESOPHAGOGASTRODUODENOSCOPY N/A 03/11/2013   SLF: Barrett's esophagus/Single ulcer in the gastric antrum-PROBABLE SOURCE FOR BLACK STOOL/ Chronic gastritis in the gastric antrum  . ESOPHAGOGASTRODUODENOSCOPY N/A 10/11/2014   Dr. [name] barrett's s/p biopsy. Hiatal hernia. Biopsy with intestinal metaplasia, glandular atypia indefinite for low grade dysplasia noted as well  . ESOPHAGOGASTRODUODENOSCOPY N/A 01/19/2015   [name]: Barrett's no dysplasia  . EUS  08/07/2012   Procedure: UPPER ENDOSCOPIC ULTRASOUND (EUS) LINEAR;  Surgeon: [name], MD;  Location: [facility];  Service: Endoscopy;  Laterality: N/A;  . EUS  08/21/2012   Procedure: UPPER ENDOSCOPIC ULTRASOUND (EUS) LINEAR;  Surgeon: [name], MD;  Location: [facility];  Service: Endoscopy;  Laterality: N/A;  . INCISION AND DRAINAGE ABSCESS N/A 11/13/2015  Procedure: INCISION AND DRAINAGE ABSCESS;  Surgeon: Aviva Signs, MD;  Location: [facility];  Service: General;  Laterality: N/A;  . INGUINAL HERNIA REPAIR Bilateral 1990's  . LAPAROSCOPIC NEPHRECTOMY Right 06/27/2009   dr Alinda Money  . LUNG REMOVAL, PARTIAL  1970   left lower lobe, benign    Family History  Problem Relation Age of Onset  . Lung cancer Brother   . Prostate cancer Brother   . Lumbar disc disease Brother   . Prostate cancer Brother   . Colon cancer Neg Hx      Social History   Tobacco Use  . Smoking status: Former Smoker    Packs/day: 0.25    Years: 4.00    Pack years: 1.00    Types: Cigars    Last attempt to quit: 05/16/2014    Years since quitting: 3.5  . Smokeless tobacco: Current User    Types: Chew  Substance Use Topics  . Alcohol use: Yes    Alcohol/week: 2.4 - 3.0 oz    Types: 2 - 3 Cans of beer, 2 Shots of liquor per week    Comment: binges intermittently, 1/5 vodka daily until it makes him sick, last ETOH was about 3 months ago  . Drug use: No    Medications:  I have reviewed the patient's current medications. Prior to Admission:  Medications Prior to Admission  Medication Sig Dispense Refill Last Dose  . allopurinol (ZYLOPRIM) 100 MG tablet Take 0.5 tablets (50 mg total) by mouth 2 (two) times daily. (Patient taking differently: Take 100 mg by mouth 2 (two) times daily. ) 20 tablet 0 12/15/2017 at Unknown time  . bisacodyl (DULCOLAX) 10 MG suppository Place 1 suppository (10 mg total) rectally daily as needed for moderate constipation. 12 suppository 0 unknown  . cabozantinib (CABOMETYX) 40 MG tablet Take 40 mg by mouth daily.   12/15/2017 at Unknown time  . enoxaparin (LOVENOX) 120 MG/0.8ML injection Inject 0.8 mLs (120 mg total) into the skin daily. 60 Syringe 0 12/15/2017 at 1500  . oxyCODONE (OXY IR/ROXICODONE) 5 MG immediate release tablet Take 1 tablet (5 mg total) by mouth every 2 (two) hours as needed for breakthrough pain. 60 tablet 0 12/16/2017 at Unknown time  . oxyCODONE (OXYCONTIN) 20 mg 12 hr tablet Take 1 tablet (20 mg total) by mouth every 12 (twelve) hours. 60 tablet 0 12/15/2017 at Unknown time  . pantoprazole (PROTONIX) 40 MG tablet Take 1 tablet (40 mg total) by mouth daily. 30 tablet 0 unknown   Scheduled: . allopurinol  100 mg Oral BID  . cabozantinib  40 mg Oral Daily  . enoxaparin  120 mg Subcutaneous Q24H  . feeding supplement (ENSURE ENLIVE)  237 mL Oral BID BM  . feeding supplement (PRO-STAT SUGAR  FREE 64)  30 mL Oral BID  . methylPREDNISolone (SOLU-MEDROL) injection  60 mg Intravenous Q12H  . pantoprazole  40 mg Oral Daily   Continuous: . sodium chloride 75 mL/hr at 12/18/17 1037  . piperacillin-tazobactam (ZOSYN)  IV Stopped (12/18/17 0919)  . vancomycin     SNK:[id number] **OR** acetaminophen, bisacodyl, HYDROmorphone (DILAUDID) injection  Allergies: Allergies  Allergen Reactions  . Nsaids Other (See Comments)    Patient only has one kidney.   Marland Kitchen Amoxicillin Nausea And Vomiting  . Zofran [Ondansetron Hcl] Hives  . Demerol [Meperidine] Rash     ROS:  A comprehensive review of systems was negative except for: Constitutional: positive for chills Gastrointestinal: positive for perirectal pain and bleeding from site  Blood pressure (!) 148/73, pulse (!) 50, temperature (!) 97.4 F (36.3 C), temperature source Oral, resp. rate 20, height 5' 8"  (1.727 m), weight 175 lb 11.3 oz (79.7 kg), SpO2 100 %. Physical Exam  Results: Results for orders placed or performed during the hospital encounter of 12/16/17 (from the past 48 hour(s))  Urinalysis, Routine w reflex microscopic     Status: Abnormal   Collection Time: 12/16/17  2:49 PM  Result Value Ref Range   Color, Urine YELLOW YELLOW   APPearance CLEAR CLEAR   Specific Gravity, Urine 1.034 (H) 1.005 - 1.030   pH 6.0 5.0 - 8.0   Glucose, UA NEGATIVE NEGATIVE mg/dL   Hgb urine dipstick MODERATE (A) NEGATIVE   Bilirubin Urine NEGATIVE NEGATIVE   Ketones, ur 20 (A) NEGATIVE mg/dL   Protein, ur NEGATIVE NEGATIVE mg/dL   Nitrite NEGATIVE NEGATIVE   Leukocytes, UA NEGATIVE NEGATIVE   RBC / HPF 0-5 0 - 5 RBC/hpf   WBC, UA 0-5 0 - 5 WBC/hpf   Bacteria, UA NONE SEEN NONE SEEN   Squamous Epithelial / LPF NONE SEEN NONE SEEN   Mucus PRESENT     Comment: Performed at [facility], [address], [place], [place] [address]  Comprehensive metabolic panel     Status: Abnormal   Collection Time: 12/16/17  3:19 PM  Result Value  Ref Range   Sodium 133 (L) 135 - 145 mmol/L   Potassium 4.1 3.5 - 5.1 mmol/L   Chloride 99 (L) 101 - 111 mmol/L   CO2 24 22 - 32 mmol/L   Glucose, Bld 100 (H) 65 - 99 mg/dL   BUN 9 6 - 20 mg/dL   Creatinine, Ser 1.24 0.61 - 1.24 mg/dL   Calcium 8.0 (L) 8.9 - 10.3 mg/dL   Total Protein 5.3 (L) 6.5 - 8.1 g/dL   Albumin 2.2 (L) 3.5 - 5.0 g/dL   AST 16 15 - 41 U/L   ALT 11 (L) 17 - 63 U/L   Alkaline Phosphatase 87 38 - 126 U/L   Total Bilirubin 1.2 0.3 - 1.2 mg/dL   GFR calc non Af Amer 59 (L) >60 mL/min   GFR calc Af Amer >60 >60 mL/min    Comment: (NOTE) The eGFR has been calculated using the CKD EPI equation. This calculation has not been validated in all clinical situations. eGFR's persistently <60 mL/min signify possible Chronic Kidney Disease.    Anion gap 10 5 - 15    Comment: Performed at [facility], [address], [place], [place] [address]  CBC with Differential     Status: Abnormal   Collection Time: 12/16/17  3:19 PM  Result Value Ref Range   WBC 5.1 4.0 - 10.5 K/uL   RBC 2.94 (L) 4.22 - 5.81 MIL/uL   Hemoglobin 8.5 (L) 13.0 - 17.0 g/dL   HCT 27.3 (L) 39.0 - 52.0 %   MCV 92.9 78.0 - 100.0 fL   MCH 28.9 26.0 - 34.0 pg   MCHC 31.1 30.0 - 36.0 g/dL   RDW 18.1 (H) 11.5 - 15.5 %   Platelets 139 (L) 150 - 400 K/uL   Neutrophils Relative % 83 %   Neutro Abs 4.2 1.7 - 7.7 K/uL   Lymphocytes Relative 12 %   Lymphs Abs 0.6 (L) 0.7 - 4.0 K/uL   Monocytes Relative 5 %   Monocytes Absolute 0.2 0.1 - 1.0 K/uL   Eosinophils Relative 0 %   Eosinophils Absolute 0.0 0.0 - 0.7 K/uL  Basophils Relative 0 %   Basophils Absolute 0.0 0.0 - 0.1 K/uL    Comment: Performed at [facility], [address], [place], [place] [address]  Lactic acid, plasma     Status: None   Collection Time: 12/16/17  3:21 PM  Result Value Ref Range   Lactic Acid, Venous 0.8 0.5 - 1.9 mmol/L    Comment: Performed at [facility], [address] [place], [place] [address]  Lipase, blood     Status:  None   Collection Time: 12/16/17  3:46 PM  Result Value Ref Range   Lipase 21 11 - 51 U/L    Comment: Performed at [facility], [address], [place], [place] [address]  Comprehensive metabolic panel     Status: Abnormal   Collection Time: 12/17/17  5:51 AM  Result Value Ref Range   Sodium 134 (L) 135 - 145 mmol/L   Potassium 4.3 3.5 - 5.1 mmol/L   Chloride 99 (L) 101 - 111 mmol/L   CO2 23 22 - 32 mmol/L   Glucose, Bld 111 (H) 65 - 99 mg/dL   BUN 10 6 - 20 mg/dL   Creatinine, Ser 1.32 (H) 0.61 - 1.24 mg/dL   Calcium 7.8 (L) 8.9 - 10.3 mg/dL   Total Protein 5.2 (L) 6.5 - 8.1 g/dL   Albumin 2.0 (L) 3.5 - 5.0 g/dL   AST 13 (L) 15 - 41 U/L   ALT 9 (L) 17 - 63 U/L   Alkaline Phosphatase 76 38 - 126 U/L   Total Bilirubin 1.4 (H) 0.3 - 1.2 mg/dL   GFR calc non Af Amer 55 (L) >60 mL/min   GFR calc Af Amer >60 >60 mL/min    Comment: (NOTE) The eGFR has been calculated using the CKD EPI equation. This calculation has not been validated in all clinical situations. eGFR's persistently <60 mL/min signify possible Chronic Kidney Disease.    Anion gap 12 5 - 15    Comment: Performed at [facility], [address] [place], [place] [address]  CBC     Status: Abnormal   Collection Time: 12/17/17  5:51 AM  Result Value Ref Range   WBC 5.6 4.0 - 10.5 K/uL   RBC 2.87 (L) 4.22 - 5.81 MIL/uL   Hemoglobin 8.4 (L) 13.0 - 17.0 g/dL   HCT 26.6 (L) 39.0 - 52.0 %   MCV 92.7 78.0 - 100.0 fL   MCH 29.3 26.0 - 34.0 pg   MCHC 31.6 30.0 - 36.0 g/dL   RDW 18.1 (H) 11.5 - 15.5 %   Platelets 131 (L) 150 - 400 K/uL    Comment: Performed at [facility], [address], [place], [place] [address]  Uric acid     Status: None   Collection Time: 12/17/17  5:51 AM  Result Value Ref Range   Uric Acid, Serum 6.1 4.4 - 7.6 mg/dL    Comment: Performed at [facility], [address], [place], [place] [address]   Personally reviewed all CT from now to 2016, persistent area of gas/ fluid of varying sizes right at  the level of the levator muscles on the right, tracking from the area to the perianal skin region; given the location and the persistent nature, has a high perirectal fistula tracking from above the levators, this is the reason why it was not found on the EUA with hydrogen peroxide   Mr Lumbar Spine Wo Contrast  Result Date: 12/17/2017 CLINICAL DATA:  Low back and BILATERAL leg  pain. LEFT renal mass, appears represent metastatic renal cell carcinoma. Pancreatic masses, question metastases. Best obtainable images due to patient condition. EXAM: MRI LUMBAR SPINE WITHOUT CONTRAST TECHNIQUE: Multiplanar, multisequence MR imaging of the lumbar spine was performed. No intravenous contrast was administered. COMPARISON:  CT abdomen pelvis 12/16/2017. FINDINGS: Significant motion degradation. Small or subtle lesions could be overlooked. No contrast was given. Segmentation: Five lumbar type vertebral bodies. Alignment: Trace anterolisthesis L4-5 is facet mediated, 1-2 mm. Otherwise anatomic. Mid lumbar levoconvex scoliosis of a mild nature. Vertebrae:  No fracture, evidence of discitis, or bone lesion. Conus medullaris and cauda equina: Conus extends to the L1 level. Conus and cauda equina appear normal. Paraspinal and other soft tissues: Unable to assess. Disc levels: L1-L2:  Normal. L2-L3:  Normal. L3-L4:  Annular bulge.  No impingement. L4-L5: Trace anterolisthesis. Central protrusion. Posterior element hypertrophy. Mild stenosis. No definite impingement. L5-S1: Advanced disc space narrowing. Osseous spurring with annular bulge. Facet arthropathy. No subarticular zone narrowing. There is BILATERAL foraminal narrowing, related to loss of interspace height, bony overgrowth, and bulging disc material. This is worse on the LEFT. It is possible that there is LEFT L5 nerve root impingement. IMPRESSION: Motion degraded examination demonstrating no definite lumbar vertebral body metastatic disease, intraspinal mass lesion, or  severe spinal stenosis. Multilevel spondylosis most pronounced at L4-5 and L5-S1. Advanced disc space narrowing at L5-S1 with spurring could be accompanied by symptomatic foraminal narrowing on the LEFT. Electronically Signed   By: Staci Righter M.D.   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 Sagittal and coronal MPR images reconstructed from axial data set. CONTRAST:  144m ISOVUE-300 IOPAMIDOL (ISOVUE-300) INJECTION 61% IV. No oral contrast. COMPARISON:  11/28/2017 FINDINGS: Lower chest: Bibasilar atelectasis Hepatobiliary: Distended gallbladder little changed. 7 mm cyst LEFT lobe image 20. Liver otherwise unremarkable. Pancreas: Mass lesion at proximal tail of pancreas 4.4 x 3.9 x 5.3 cm, centrally necrotic with peripheral enhancement. Additional mass at pancreatic head 2.6 x 1.8 x 2.5 cm. Findings are consistent with neoplasms/metastases. Spleen: Unremarkable.  Small splenule. Adrenals/Urinary Tract: Adrenal glands normal appearance. Cyst at medial aspect upper pole LEFT kidney 3.5 x 3.8 cm image 28. Large irregular enhancing neoplasm at upper pole LEFT kidney 5.1 x 4.4 x 4.9 cm compatible with neoplasm, with tumor extending into the LEFT renal vein. Tiny cyst at inferior pole LEFT kidney. Prior RIGHT nephrectomy. LEFT ureter and urinary bladder unremarkable. Stomach/Bowel: Stomach decompressed. Few scattered distal colonic diverticula. Wall thickening of the mid sigmoid colon with hazy infiltration of pericolic tissue planes, could represent diverticulitis or tumor. Minimal RIGHT lateral  rectal wall thickening with adjacent infiltrative changes of perirectal fat extending into perianal region and into medial RIGHT buttock compatible with perirectal abscess. Small gas and fluid collection in the RIGHT perirectal space measures 14 mm in greatest size, decreased from the 28 mm greatest diameter on the previous exam. Remaining large and small bowel loops unremarkable. Normal appendix. Vascular/Lymphatic: Vascular structures unremarkable. No adenopathy. Reproductive: Unremarkable prostate gland Other: No free intraperitoneal air or fluid. Tiny umbilical hernia containing fat. Musculoskeletal: Mild degenerative disc disease changes at L5-S1. Degenerative changes of the hip joints  bilaterally. No focal bone lesions. IMPRESSION: Again identified large mass at the anterior aspect of the upper LEFT kidney consistent with a renal neoplasm demonstrating evidence of LEFT renal vein invasion/extension. Two pancreatic masses are identified question pancreatic metastases, not significantly changed. Subtle focus of wall thickening and pericolic infiltrative changes at the mid sigmoid colon question diverticulitis though neoplasm not completely excluded, can be assessed by colonoscopy. Tiny RIGHT perirectal abscess 14 mm diameter significantly decreased from the previous exam. Electronically Signed   By: Lavonia Dana M.D.   On: 12/16/2017 17:52   Assessment & Plan:  TYREN DUGAR is a 67 y.o. male with chronic what appears to be a supralevator abscess on the right with likely fistula track that is high in the rectum and unable to be seen on Dr. Manon Hilding EUA 05/2016.  Looking back through all of his imaging, this is chronic and likely is flaring up due to is overall immune status and metastatic cancer. He is not a good surgical candidate, and we have the potential to make his issues worse. Dr. Marcello Moores attempted [place] drainage exteriorly and did not identify the fistula, and potentially a CT/ barium enema would  aid in identifying a connection more proximally, which is draining into this chronic space and re-causing this problem.  Yet, at this time the fluid and gas collection is decreased in size and has been draining some bloody drainage.  I would finish an antibiotic course and hopefully this chronic track will become less inflamed.   -Finish antibiotic course -No surgical intervention at this time  All questions were answered to the satisfaction of the patient.   Virl Cagey 12/18/2017, 11:02 AM

## 2017-12-18 NOTE — Care Management Important Message (Signed)
Important Message  Patient Details  Name: Jeffrey Trujillo MRN: [id number] Date of Birth: Nov 18, 1950   Medicare Important Message Given:  Yes    Shelda Altes 12/18/2017, 11:26 AM

## 2017-12-19 LAB — CBC
HEMATOCRIT: 23.8 % — AB (ref 39.0–52.0)
Hemoglobin: 7.3 g/dL — ABNORMAL LOW (ref 13.0–17.0)
MCH: 28.3 pg (ref 26.0–34.0)
MCHC: 30.7 g/dL (ref 30.0–36.0)
MCV: 92.2 fL (ref 78.0–100.0)
Platelets: 120 10*3/uL — ABNORMAL LOW (ref 150–400)
RBC: 2.58 MIL/uL — AB (ref 4.22–5.81)
RDW: 16.9 % — ABNORMAL HIGH (ref 11.5–15.5)
WBC: 1.9 10*3/uL — ABNORMAL LOW (ref 4.0–10.5)

## 2017-12-19 LAB — BASIC METABOLIC PANEL
ANION GAP: 9 (ref 5–15)
BUN: 18 mg/dL (ref 6–20)
CALCIUM: 7.9 mg/dL — AB (ref 8.9–10.3)
CO2: 25 mmol/L (ref 22–32)
Chloride: 104 mmol/L (ref 101–111)
Creatinine, Ser: 0.94 mg/dL (ref 0.61–1.24)
GFR calc non Af Amer: >60 mL/min (ref >60)
Glucose, Bld: 147 mg/dL — ABNORMAL HIGH (ref 65–99)
POTASSIUM: 3.8 mmol/L (ref 3.5–5.1)
Sodium: 138 mmol/L (ref 135–145)

## 2017-12-19 MED ORDER — AMOXICILLIN-POT CLAVULANATE 875-125 MG PO TABS
1.0000 | ORAL_TABLET | Freq: Two times a day (BID) | ORAL | Status: DC
  Administered 2017-12-19 – 2017-12-20 (×3): 1 via ORAL
  Filled 2017-12-19 (×3): qty 1

## 2017-12-19 MED ORDER — OXYCODONE HCL ER 20 MG PO T12A
20.0000 mg | EXTENDED_RELEASE_TABLET | Freq: Two times a day (BID) | ORAL | Status: DC
  Administered 2017-12-19 – 2017-12-20 (×2): 20 mg via ORAL
  Filled 2017-12-19 (×2): qty 1

## 2017-12-19 MED ORDER — METHYLPREDNISOLONE SODIUM SUCC 125 MG IJ SOLR
60.0000 mg | Freq: Once | INTRAMUSCULAR | Status: AC
  Administered 2017-12-19: 60 mg via INTRAVENOUS
  Filled 2017-12-19: qty 2

## 2017-12-19 NOTE — Plan of Care (Signed)
progressing 

## 2017-12-19 NOTE — Care Management Note (Signed)
Case Management Note  Patient Details  Name: Jeffrey Trujillo MRN: [id number] Date of Birth: 18-Nov-1950  Subjective/Objective:     Admitted with uncontrolled pain. Pt has gout, metastatic cancer and perirectal abscess. Pt from home, lives with wife and ind with ADLs pta. He is receiving PO Chemo. Pt has had an OP palliative referral made last admission with [facility]. Palliative medicine following patient in hospital. Pt's sister and other family members at the bedside. Pt agreeable to discuss DC plan in front of family.                 Action/Plan: Pt plans to return home with his wife. We discuss the difference between palliative and hospice services. We discuss how his oncologist would not sign order for palliative services after [facility] reached out to him for an order after last admission. Pt acknowledges his admissions has mostly all been related to pain, he does not want to keep returning for pain and would like to manage his pain at home. He understands palliative services could do that and he would not have to stop any treatments. He plans to meet with oncologist later this month push for him to sign orders for palliative referral. CM recommends he try to make appointment sooner than the end of the month. CM offered [facility] services for pt at DC. He does not want HH at this time. His sister requests PT work with him before his DC and patient says he will get up and walk himself, staff asked him to yesterday but he was feeling too bad, he is better now.   Expected Discharge Date:     12/20/17             Expected Discharge Plan:  Home/Self Care  In-House Referral:  Hospice / Palliative Care  Discharge planning Services  CM Consult  Post Acute Care Choice:  NA Choice offered to:  NA  Status of Service:  Completed, signed off  Sherald Barge, RN 12/19/2017, 12:54 PM

## 2017-12-19 NOTE — Progress Notes (Signed)
PROGRESS NOTE    Jeffrey Trujillo  ZLD:[id number] DOB: 08-22-1951 DOA: 12/16/2017 PCP: Abran Richard, MD     Brief Narrative:  67 year old man admitted from home on 4/1 due to 1 day history of worsening bilateral ankle pain and left upper extremity pain and swelling.  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  The patient was recently discharged from the hospital on March/18th after being treated for a gout exacerbation of his right upper extremity.  Admission was requested for pain management.  Also CT scan of the abdomen showed right lateral rectal wall thickening concerning for perirectal abscess, broad-spectrum antibiotics were started and surgical consultation requested.   Assessment & Plan:   Principal Problem:   Leg pain Active Problems:   Thrombocytopenia ([place])   Anemia   Malnutrition of moderate degree (HCC)   Supralevator abscess   DVT, bilateral lower limbs (HCC)   Renal cell carcinoma (HCC)   Back pain   Hyponatremia   Uncontrolled pain   Acute gouty arthritis   Perirectal fistula   Uncontrolled pain secondary to gouty arthritis flare -Continue IV Solu-Medrol at current dose, in addition we will give an extra dose of Solu-Medrol today. -Can continue Dilaudid as needed. -Resume home dose of OxyContin.  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 -Seen by surgery, Dr. Constance Haw.  We are trying a conservative approach at this time. - antibiotics transitioned over to Augmentin with a planned course of 2 weeks.  Metastatic renal cell carcinoma -Continue cabozantinib -Outpatient follow-up  with his oncologist, 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 -Suspect acute component was due to dehydration and possibly high-dose steroid use for his gout. -Continue IV fluids. -Recheck renal function in a.m.     DVT prophylaxis: Full dose Lovenox Code Status: DNR Family Communication: Wife at bedside updated on plan of care and all questions answered Disposition Plan: Hope for discharge home over next 24-48 hours with improvement in pain  Consultants:   Palliative care  Procedures:   None  Antimicrobials:  Anti-infectives (From admission, onward)   Start     Dose/Rate Route Frequency Ordered Stop   12/19/17 1000  amoxicillin-clavulanate (AUGMENTIN) 875-125 MG per tablet 1 tablet     1 tablet Oral Every 12 hours 12/19/17 0957     12/18/17 1500  vancomycin (VANCOCIN) IVPB 1000 mg/200 mL premix  Status:  Discontinued     1,000 mg 200 mL/hr over 60 Minutes Intravenous Every 24 hours 12/17/17 1302 12/19/17 0956   12/17/17 1500  vancomycin (VANCOCIN) 1,500 mg in sodium chloride 0.9 % 500 mL IVPB     1,500 mg 250 mL/hr over 120 Minutes Intravenous  Once 12/17/17 1302 12/17/17 1843   12/17/17 0800  piperacillin-tazobactam (ZOSYN) IVPB 3.375 g  Status:  Discontinued     3.375 g 12.5 mL/hr over 240 Minutes Intravenous Every 8 hours 12/17/17 0735 12/19/17 0956   12/16/17 1500  vancomycin (VANCOCIN) IVPB 1000 mg/200 mL premix     1,000 mg 200 mL/hr over 60 Minutes Intravenous  Once 12/16/17 1449 12/16/17 1755   12/16/17 1500  piperacillin-tazobactam (ZOSYN) IVPB 3.375 g     3.375 g 100 mL/hr over 30 Minutes Intravenous  Once 12/16/17 1449 12/16/17 1558       Subjective: Left hand pain is significantly improved  today.  Objective: Vitals:   12/19/17 0500 12/19/17 0559 12/19/17 1141 12/19/17 1351  BP:  122/68  138/63  Pulse:  (!) 44  61  Resp:  15  20  Temp:  97.6 F (36.4 C)  (!) 97.5 F (36.4 C)  TempSrc:  Oral  Oral  SpO2:  96% 95% 96%  Weight: 80.3 kg (177 lb 0.5 oz)     Height:        Intake/Output Summary (Last 24 hours) at 12/19/2017 1636 Last data filed at 12/19/2017 1200 Gross per 24 hour  Intake 1705 ml  Output 200 ml  Net 1505 ml   Filed Weights   12/17/17 0624 12/18/17 0501 12/19/17 0500  Weight: 81.3 kg (179 lb 3.7 oz) 79.7 kg (175 lb 11.3 oz) 80.3 kg (177 lb 0.5 oz)    Examination:  General exam: Alert, awake, oriented x 3 Respiratory system: Clear to auscultation. Respiratory effort normal. Cardiovascular system:RRR. No murmurs, rubs, gallops. Gastrointestinal system: Abdomen is nondistended, soft and nontender. No organomegaly or masses felt. Normal bowel sounds heard. Central nervous system: Alert and oriented. No focal neurological deficits. Extremities: 3+ pitting edema bilateral lower extremities, left hand is edematous, however decreased from yesterday and is no longer erythematous. Skin: No rashes, lesions or ulcers Psychiatry: Judgement and insight appear normal. Mood & affect appropriate.      Data Reviewed: I have personally reviewed following labs and imaging studies  CBC: Recent Labs  Lab 12/16/17 1519 12/17/17 0551 12/19/17 0501  WBC 5.1 5.6 1.9*  NEUTROABS 4.2  --   --   HGB 8.5* 8.4* 7.3*  HCT 27.3* 26.6* 23.8*  MCV 92.9 92.7 92.2  PLT 139* 131* 983*   Basic Metabolic Panel: Recent Labs  Lab 12/16/17 1519 12/17/17 0551 12/19/17 0501  NA 133* 134* 138  K 4.1 4.3 3.8  CL 99* 99* 104  CO2 24 23 25   GLUCOSE 100* 111* 147*  BUN 9 10 18   CREATININE 1.24 1.32* 0.94  CALCIUM 8.0* 7.8* 7.9*   GFR: Estimated Creatinine Clearance: 74.8 mL/min (by C-G formula based on SCr of 0.94 mg/dL). Liver Function Tests: Recent Labs  Lab  12/16/17 1519 12/17/17 0551  AST 16 13*  ALT 11* 9*  ALKPHOS 87 76  BILITOT 1.2 1.4*  PROT 5.3* 5.2*  ALBUMIN 2.2* 2.0*   Recent Labs  Lab 12/16/17 1546  LIPASE 21   No results for input(s): AMMONIA in the last 168 hours. Coagulation Profile: No results for input(s): INR, PROTIME in the last 168 hours. Cardiac Enzymes: No results for input(s): CKTOTAL, CKMB, CKMBINDEX, TROPONINI in the last 168 hours. BNP (last 3 results) No results for input(s): PROBNP in the last 8760 hours. HbA1C: No results for input(s): HGBA1C in the last 72 hours. CBG: No results for input(s): GLUCAP in the last 168 hours. Lipid Profile: No results for input(s): CHOL, HDL, LDLCALC, TRIG, CHOLHDL, LDLDIRECT in the last 72 hours. Thyroid Function Tests: No results for input(s): TSH, T4TOTAL, FREET4, T3FREE, THYROIDAB in the last 72 hours. Anemia Panel: No results for input(s): VITAMINB12, FOLATE, FERRITIN, TIBC, IRON, RETICCTPCT in  the last 72 hours. Urine analysis:    Component Value Date/Time   COLORURINE YELLOW 12/16/2017 1449   APPEARANCEUR CLEAR 12/16/2017 1449   LABSPEC 1.034 (H) 12/16/2017 1449   PHURINE 6.0 12/16/2017 1449   GLUCOSEU NEGATIVE 12/16/2017 1449   HGBUR MODERATE (A) 12/16/2017 1449   BILIRUBINUR NEGATIVE 12/16/2017 1449   KETONESUR 20 (A) 12/16/2017 1449   PROTEINUR NEGATIVE 12/16/2017 1449   UROBILINOGEN 4.0 (H) 02/11/2015 1409   NITRITE NEGATIVE 12/16/2017 1449   LEUKOCYTESUR NEGATIVE 12/16/2017 1449   Sepsis Labs: @LABRCNTIP (procalcitonin:4,lacticidven:4)  )No results found for this or any previous visit (from the past 240 hour(s)).       Radiology Studies: No results found.      Scheduled Meds: . allopurinol  100 mg Oral BID  . amoxicillin-clavulanate  1 tablet Oral Q12H  . cabozantinib  40 mg Oral Daily  . enoxaparin  120 mg Subcutaneous Q24H  . feeding supplement (ENSURE ENLIVE)  237 mL Oral BID BM  . feeding supplement (PRO-STAT SUGAR FREE 64)  30  mL Oral BID  . methylPREDNISolone (SOLU-MEDROL) injection  60 mg Intravenous Q12H  . pantoprazole  40 mg Oral Daily   Continuous Infusions: . sodium chloride 75 mL/hr at 12/19/17 0434     LOS: 3 days    Time spent: 30 minutes. Greater than 50% of this time was spent in direct contact with the patient coordinating care.     Lelon Frohlich, MD [facility] Pager [phone]  If 7PM-7AM, please contact night-coverage [email or web address] Password [facility] 12/19/2017, 4:36 PM

## 2017-12-20 ENCOUNTER — Telehealth: Payer: Self-pay | Admitting: Medical Oncology

## 2017-12-20 MED ORDER — AMOXICILLIN-POT CLAVULANATE 875-125 MG PO TABS: 1.0000 | ORAL_TABLET | Freq: Two times a day (BID) | ORAL | 0 refills | Status: AC

## 2017-12-20 MED ORDER — OXYCODONE HCL 5 MG PO TABS: 5.0000 mg | ORAL_TABLET | ORAL | 0 refills | Status: DC | PRN

## 2017-12-20 MED ORDER — PREDNISONE 10 MG PO TABS: 10.0000 mg | ORAL_TABLET | Freq: Every day | ORAL | 0 refills | Status: AC

## 2017-12-20 MED ORDER — OXYCODONE HCL 5 MG PO TABS
5.0000 mg | ORAL_TABLET | ORAL | Status: DC | PRN
  Administered 2017-12-20: 5 mg via ORAL
  Filled 2017-12-20: qty 1

## 2017-12-20 MED ORDER — OXYCODONE HCL ER 20 MG PO T12A: 20.0000 mg | EXTENDED_RELEASE_TABLET | Freq: Two times a day (BID) | ORAL | 0 refills | Status: AC

## 2017-12-20 NOTE — Care Management Important Message (Signed)
Important Message  Patient Details  Name: Jeffrey Trujillo MRN: [id number] Date of Birth: Jan 05, 1951   Medicare Important Message Given:  Yes    Shelda Altes 12/20/2017, 10:14 AM

## 2017-12-20 NOTE — Telephone Encounter (Signed)
Asking for referral for palliative care , not Hospice. I recommended she send an in basket to Dr Alen Blew.

## 2017-12-20 NOTE — Progress Notes (Addendum)
Daily Progress Note   Patient Name: ALEXANDER AUMENT       Date: 12/20/2017 DOB: 05-23-1951  Age: 67 y.o. MRN#: [id number] Attending Physician: Isaac Bliss, [place] Primary Care Physician: Abran Richard, MD Admit Date: 12/16/2017  Reason for Consultation/Follow-up: Establishing goals of care, Hospice Evaluation and Psychosocial/spiritual support  Subjective: Mr. Perkin is resting quietly in bed.  He greets me making an briefly keeping eye contact.  He is alert and oriented, calm and cooperative.  There is no family at bedside at this time.  We talked about his likely discharged today.  We also talked about pain management.  He shares that his previous regimen of OxyContin extended release 20 mg by mouth twice per day along with oxycodone immediate release 5 mg by mouth every 2 hours as needed for breakthrough pain was effective for him.  We talked about his continued swelling in his legs.  Mr. Weatherspoon endorses today that he understands he may not be able to have the swelling reduced.  He agrees for me to call his oncologist office to ask for palliative services with [place] to be placed.  Call to [facility], [name] at [phone].  Spoke with Diane in detail related to Mr. Marvin current hospitalization and treatment plan.  Diane shares that Mr. Pulido has an appointment on 4/18.  We discussed patient's desire for palliative in-home service with [place] for pain and symptom management, psychosocial support, continue discussions related to goals of care.  This will in no way limit Mr. Krasinski ability to continue chemotherapy.   Pain management recommendations.  ER OxyContin 20 mg by mouth every 12 hours   oxycodone 5 mg by mouth every 2 hours as  needed breakthrough pain.  Length of Stay: 4  Current Medications: Scheduled Meds:  . allopurinol  100 mg Oral BID  . amoxicillin-clavulanate  1 tablet Oral Q12H  . cabozantinib  40 mg Oral Daily  . enoxaparin  120 mg Subcutaneous Q24H  . feeding supplement (ENSURE ENLIVE)  237 mL Oral BID BM  . feeding supplement (PRO-STAT SUGAR FREE 64)  30 mL Oral BID  . methylPREDNISolone (SOLU-MEDROL) injection  60 mg Intravenous Q12H  . oxyCODONE  20 mg Oral Q12H  . pantoprazole  40 mg Oral Daily    Continuous Infusions:  PRN Meds: acetaminophen **OR** acetaminophen, bisacodyl, HYDROmorphone (DILAUDID) injection  Physical Exam  Constitutional: He is oriented to person, place, and time. No distress.  Makes and briefly keeps eye contact, calm and cooperative, appears in relative good health  HENT:  Head: Normocephalic and atraumatic.  Cardiovascular: Normal rate.  Pulmonary/Chest: Effort normal. No respiratory distress.  Abdominal: Soft. He exhibits no distension.  Musculoskeletal: He exhibits edema.  Neurological: He is alert and oriented to person, place, and time.  Skin: Skin is warm and dry.  Psychiatric:  Calm and cooperative  Nursing note and vitals reviewed.           Vital Signs: BP 140/78 (BP Location: Right Arm)   Pulse (!) 57   Temp (!) 97.5 F (36.4 C) (Oral)   Resp 16   Ht 5\' 8"  (1.727 m)   Wt 79.3 kg (174 lb 13.2 oz)   SpO2 97%   BMI 26.58 kg/m  SpO2: SpO2: 97 % O2 Device: O2 Device: Room Air O2 Flow Rate:    Intake/output summary:   Intake/Output Summary (Last 24 hours) at 12/20/2017 1047 Last data filed at 12/20/2017 0900 Gross per 24 hour  Intake 720 ml  Output 900 ml  Net -180 ml   LBM: Last BM Date: 12/20/17 Baseline Weight: Weight: 86.2 kg (190 lb) Most recent weight: Weight: 79.3 kg (174 lb 13.2 oz)       Palliative Assessment/Data:    Flowsheet Rows     Most Recent Value  Intake Tab  Referral Department  Hospitalist  Unit at Time of  Referral  Med/Surg Unit  Palliative Care Primary Diagnosis  Cancer  Date Notified  12/16/17  Palliative Care Type  Return patient Palliative Care  Reason for referral  Pain, Counsel Regarding Hospice  Date of Admission  12/16/17  Date first seen by Palliative Care  12/17/17  # of days Palliative referral response time  1 Day(s)  # of days IP prior to Palliative referral  0  Clinical Assessment  Psychosocial & Spiritual Assessment  Palliative Care Outcomes      Patient Active Problem List   Diagnosis Date Noted  . Perirectal fistula   . Uncontrolled pain 12/17/2017  . Acute gouty arthritis 12/17/2017  . Leg pain 12/16/2017  . Back pain 12/16/2017  . Hyponatremia 12/16/2017  . Encounter for hospice care discussion   . Palliative care by specialist   . Renal cell carcinoma ([place]) 11/28/2017  . Acute pancreatitis 11/22/2017  . DVT, bilateral lower limbs ([place]) 11/15/2017  . History of renal cell cancer   . Chronic neck pain   . Goals of care, counseling/discussion   . Palliative care encounter   . Encephalopathy acute 11/15/2016  . Fever 12/07/2015  . Gouty arthritis 12/07/2015  . Fall   . Pyrexia   . Acute gout 11/15/2015  . Supralevator abscess 11/12/2015  . Hypokalemia 11/12/2015  . Anemia of chronic disease 11/12/2015  . Gallstones   . Sinus bradycardia 02/14/2015  . Malnutrition of moderate degree ([place]) 02/12/2015  . Elevated LFTs 02/12/2015  . Abdominal pain, right upper quadrant 02/12/2015  . Acute kidney injury ([place]) 02/12/2015  . Cholecystitis 02/11/2015  . Hiatal hernia   . Chest pain 04/15/2014  . PUD (peptic ulcer disease) 04/21/2013  . Melena 03/10/2013  . Flank pain 03/10/2013  . Leukopenia 03/10/2013  . Thrombocytopenia ([place]) 03/10/2013  . Abdominal abscess 03/10/2013  . Anemia 03/10/2013  . ETOH abuse 03/10/2013  . Bradycardia 03/10/2013  . Barrett's esophagus  01/04/2013  . Renal cell cancer ([place]) 09/25/2012  . Lower GI bleed 09/25/2012  .  Anemia due to blood loss 09/25/2012  . Acute gout of right foot 09/25/2012  . DJD (degenerative joint disease) 09/25/2012  . Pancreatic mass 08/04/2012  . Acute strain of neck muscle 06/05/2011  . RECTAL BLEEDING 10/20/2009  . NAUSEA WITH VOMITING 10/20/2009  . DIARRHEA, BLOODY 10/20/2009    Palliative Care Assessment & Plan   Patient Profile: 67 y.o.malewith past medical history of metastatic renal Cell carcinoma with metastatic burden to pancreas,stage III chronic kidney disease, peptic ulcer disease, diverticulosis of colon, chronic pain, Barrett's esophagus, anemia of chronic renal disease, chronic painadmitted on 4/1/2019with Left-sided flank pain in setting of renal cell carcinoma.  Assessment: Renal Cell carcinoma; follow-up with oncologist [facility]. Patient elects to continue with chemotherapy, declines hospice at this time. Gout flare;left hand gout flare, some improvement today.   Able to move hand without extreme pain.  Right hand gout flare on last palliative follow-up. Patient would benefit from better gout control.   Recommendations/Plan:  Continue to treat the treatable.  Continue discussions related to the benefits of hospice.  Recommend increasing gout medications.  Pain management by oncology/palliative care [place].  Goals of Care and Additional Recommendations:  Limitations on Scope of Treatment: Treat the treatable but no CPR, no intubation.  Continue with oral chemotherapy agent  Code Status:    Code Status Orders  (From admission, onward)        Start     Ordered   12/16/17 2115  Do not attempt resuscitation (DNR)  Continuous    Question Answer Comment  In the event of cardiac or respiratory ARREST Do not call a "code blue"   In the event of cardiac or respiratory ARREST Do not perform Intubation, CPR, defibrillation or ACLS   In the event of cardiac or respiratory ARREST Use medication by any route, position, wound care, and  other measures to relive pain and suffering. May use oxygen, suction and manual treatment of airway obstruction as needed for comfort.      12/16/17 2114    Code Status History    Date Active Date Inactive Code Status Order ID Comments User Context   11/28/2017 1314 12/02/2017 1616 DNR [id number]  Heath Lark D, DO ED   11/22/2017 1243 11/26/2017 1602 Full Code [id number]  Isaac Bliss, Rayford Halsted, MD Inpatient   11/15/2017 2026 11/17/2017 1534 DNR [id number]  Vianne Bulls, MD ED   11/15/2016 1955 11/20/2016 1453 DNR [id number]  Karmen Bongo, MD Inpatient   12/07/2015 2211 12/09/2015 1707 Full Code [id number]  Phillips Grout, MD Inpatient   11/12/2015 1838 11/18/2015 2102 Full Code [id number]  Erline Hau, MD Inpatient   02/11/2015 2337 02/15/2015 1556 Full Code [id number]  Truett Mainland, DO Inpatient   04/14/2014 1904 04/15/2014 2315 Full Code [id number]  Doree Albee, MD ED   03/10/2013 1151 03/12/2013 1741 Full Code [id number]  Radene Gunning, NP ED   09/25/2012 0732 09/30/2012 2007 Full Code [id number]  Yehuda Budd, RN Inpatient    Advance Directive Documentation     Most Recent Value  Type of Advance Directive  Living will  Pre-existing out of facility DNR order (yellow form or pink MOST form)  -  "MOST" Form in Place?  -       Prognosis:   < 6 months or less would not be surprising based on metastatic cancer burden, with increasing pain,and bilateral lower  extremity swelling, 4 hospital admits and 5 ED visits in 6 months.  Discharge Planning:  Return home.  Requesting services with palliative care of [place].  Care plan was discussed with nursing staff, case management, and Dr. Jerilee Hoh.  Thank you for allowing the Palliative Medicine Team to assist in the care of this patient.   Time In:  0850 Time Out:  0950 Total Time  60 minutes Prolonged Time Billed  yes       Greater than 50%  of this time was spent counseling and coordinating care related to the above  assessment and plan.  Drue Novel, NP  Please contact Palliative Medicine Team phone at [phone] for questions and concerns.

## 2017-12-20 NOTE — Discharge Summary (Addendum)
Physician Discharge Summary  Jeffrey Trujillo YTK:[id number] DOB: Feb 01, 1951 DOA: 12/16/2017  PCP: Abran Richard, MD  Admit date: 12/16/2017 Discharge date: 12/20/2017  Time spent: 45 minutes  Recommendations for Outpatient Follow-up:  -To be discharged home today. -Patient is interested in hospice services but would like to discuss with his oncologist, [name] prior. -To complete prednisone taper for acute gouty arthritis of his left wrist. -We will also need to complete 10 days of Augmentin for perirectal abscess/fistula.  Discharge Diagnoses:  Principal Problem:   Leg pain Active Problems:   Thrombocytopenia (HCC)   Anemia   Malnutrition of moderate degree (HCC)   Supralevator abscess   DVT, bilateral lower limbs (HCC)   Renal cell carcinoma (HCC)   Back pain   Hyponatremia   Uncontrolled pain   Acute gouty arthritis   Perirectal fistula Moderate malnutrition  Discharge Condition: Stable and improved  Filed Weights   12/18/17 0501 12/19/17 0500 12/20/17 0500  Weight: 79.7 kg (175 lb 11.3 oz) 80.3 kg (177 lb 0.5 oz) 79.3 kg (174 lb 13.2 oz)    History of present illness:  As per Dr. Maudie Mercury on 4/1: Jeffrey Trujillo  is a 67 y.o. male,w Anemia, CKD metastatic renal cell carcinoma, anal fistula apparently c/o increase in bilateral lower ext pain since recently discharged.  Pt also notes back pain.    In ED, CT abd pelvis  Again identified large mass at the anterior aspect of the upper LEFT kidney consistent with a renal neoplasm demonstrating evidence of LEFT renal vein invasion/extension.  Two pancreatic masses are identified question pancreatic metastases, not significantly changed.  Subtle focus of wall thickening and pericolic infiltrative changes at the mid sigmoid colon question diverticulitis though neoplasm not completely excluded, can be assessed by colonoscopy.  Tiny RIGHT perirectal abscess 14 mm diameter significantly decreased from the previous  exam.  Na 133, K 4.1, Bun 9, Creatinine 1.24 Ast 16, Alt 11 Alb 2.2 Wbc 5.1, Hgb 8.5, Plt 139  Pt will be admitted for bilateral leg pain, back pain, and severe protein calorie malnutrition and hyponatremia, and anemia    Hospital Course:   Uncontrolled pain secondary to gouty arthritis flare -Left hand pain is much improved today, will discharge home on prednisone taper. -Resume home dose of OxyContin 20 mg daily with 5 mg of OxyIR every 2 hours as needed for breakthrough pain.  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  This is chronic and likely flaring up due to immune suppression given cancer and active steroid therapy for gout. -Seen by surgery, Dr. Constance Haw.  We are trying a conservative approach at this time. - antibiotics transitioned over to Augmentin with a planned course of 2 weeks.  Metastatic renal cell carcinoma -Continue cabozantinib -Outpatient follow-up with his oncologist, 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 -Suspect acute component was due to dehydration and possibly high-dose steroid use for his gout.     Procedures:  None  Consultations:  Palliative care  Discharge Instructions  Discharge Instructions    Diet - low sodium heart healthy   Complete by:  As directed    Increase activity slowly   Complete by:  As directed  Allergies as of 12/20/2017      Reactions   Nsaids Other (See Comments)   Patient only has one kidney.    Amoxicillin Nausea And Vomiting   Zofran [ondansetron Hcl] Hives   Demerol [meperidine] Rash      Medication List    TAKE these medications   allopurinol 100 MG  tablet Commonly known as:  ZYLOPRIM Take 0.5 tablets (50 mg total) by mouth 2 (two) times daily. What changed:  how much to take   amoxicillin-clavulanate 875-125 MG tablet Commonly known as:  AUGMENTIN Take 1 tablet by mouth every 12 (twelve) hours.   bisacodyl 10 MG suppository Commonly known as:  DULCOLAX Place 1 suppository (10 mg total) rectally daily as needed for moderate constipation.   cabozantinib 40 MG tablet Commonly known as:  CABOMETYX Take 40 mg by mouth daily.   enoxaparin 120 MG/0.8ML injection Commonly known as:  LOVENOX Inject 0.8 mLs (120 mg total) into the skin daily.   oxyCODONE 20 mg 12 hr tablet Commonly known as:  OXYCONTIN Take 1 tablet (20 mg total) by mouth every 12 (twelve) hours.   oxyCODONE 5 MG immediate release tablet Commonly known as:  Oxy IR/ROXICODONE Take 1 tablet (5 mg total) by mouth every 2 (two) hours as needed for breakthrough pain.   pantoprazole 40 MG tablet Commonly known as:  PROTONIX Take 1 tablet (40 mg total) by mouth daily.   predniSONE 10 MG tablet Commonly known as:  DELTASONE Take 1 tablet (10 mg total) by mouth daily with breakfast. Take 6 tablets today and then decrease by 1 tablet daily until none are left.      Allergies  Allergen Reactions  . Nsaids Other (See Comments)    Patient only has one kidney.   Marland Kitchen Amoxicillin Nausea And Vomiting  . Zofran [Ondansetron Hcl] Hives  . Demerol [Meperidine] Rash   Follow-up Information    Abran Richard, MD. Schedule an appointment as soon as possible for a visit in 2 weeks.   Specialty:  Internal Medicine Contact information: [address] [place] [address] [phone]            The results of significant diagnostics from this hospitalization (including imaging, microbiology, ancillary and laboratory) are listed below for reference.    Significant Diagnostic Studies: Mr Lumbar Spine Wo Contrast  Result Date: 12/17/2017 CLINICAL DATA:  Low back  and BILATERAL leg pain. LEFT renal mass, appears represent metastatic renal cell carcinoma. Pancreatic masses, question metastases. Best obtainable images due to patient condition. EXAM: MRI LUMBAR SPINE WITHOUT CONTRAST TECHNIQUE: Multiplanar, multisequence MR imaging of the lumbar spine was performed. No intravenous contrast was administered. COMPARISON:  CT abdomen pelvis 12/16/2017. FINDINGS: Significant motion degradation. Small or subtle lesions could be overlooked. No contrast was given. Segmentation: Five lumbar type vertebral bodies. Alignment: Trace anterolisthesis L4-5 is facet mediated, 1-2 mm. Otherwise anatomic. Mid lumbar levoconvex scoliosis of a mild nature. Vertebrae:  No fracture, evidence of discitis, or bone lesion. Conus medullaris and cauda equina: Conus extends to the L1 level. Conus and cauda equina appear normal. Paraspinal and other soft tissues: Unable to assess. Disc levels: L1-L2:  Normal. L2-L3:  Normal. L3-L4:  Annular bulge.  No impingement. L4-L5: Trace anterolisthesis. Central protrusion. Posterior element hypertrophy. Mild stenosis. No definite impingement. L5-S1: Advanced disc space narrowing. Osseous spurring with annular bulge. Facet arthropathy. No subarticular zone narrowing. There is BILATERAL foraminal narrowing, related to loss of interspace height, bony overgrowth, and bulging disc material.  This is worse on the LEFT. It is possible that there is LEFT L5 nerve root impingement. IMPRESSION: Motion degraded examination demonstrating no definite lumbar vertebral body metastatic disease, intraspinal mass lesion, or severe spinal stenosis. Multilevel spondylosis most pronounced at L4-5 and L5-S1. Advanced disc space narrowing at L5-S1 with spurring could be accompanied by symptomatic foraminal narrowing on the LEFT. Electronically Signed   By: Staci Righter M.D.   On: 12/17/2017 09:11   Ct Abdomen Pelvis W Contrast  Result Date: 12/16/2017 CLINICAL DATA:  Swelling and pain  in BILATERAL lower extremities, history of buttock/anorectal abscess, metastases to pancreas, stage III chronic kidney disease, gout, peptic ulcer disease EXAM: CT ABDOMEN AND PELVIS WITH CONTRAST TECHNIQUE: Multidetector CT imaging of the abdomen and pelvis was performed using the standard protocol following bolus administration of intravenous contrast. Sagittal and coronal MPR images reconstructed from axial data set. CONTRAST:  119mL ISOVUE-300 IOPAMIDOL (ISOVUE-300) INJECTION 61% IV. No oral contrast. COMPARISON:  11/28/2017 FINDINGS: Lower chest: Bibasilar atelectasis Hepatobiliary: Distended gallbladder little changed. 7 mm cyst LEFT lobe image 20. Liver otherwise unremarkable. Pancreas: Mass lesion at proximal tail of pancreas 4.4 x 3.9 x 5.3 cm, centrally necrotic with peripheral enhancement. Additional mass at pancreatic head 2.6 x 1.8 x 2.5 cm. Findings are consistent with neoplasms/metastases. Spleen: Unremarkable.  Small splenule. Adrenals/Urinary Tract: Adrenal glands normal appearance. Cyst at medial aspect upper pole LEFT kidney 3.5 x 3.8 cm image 28. Large irregular enhancing neoplasm at upper pole LEFT kidney 5.1 x 4.4 x 4.9 cm compatible with neoplasm, with tumor extending into the LEFT renal vein. Tiny cyst at inferior pole LEFT kidney. Prior RIGHT nephrectomy. LEFT ureter and urinary bladder unremarkable. Stomach/Bowel: Stomach decompressed. Few scattered distal colonic diverticula. Wall thickening of the mid sigmoid colon with hazy infiltration of pericolic tissue planes, could represent diverticulitis or tumor. Minimal RIGHT lateral rectal wall thickening with adjacent infiltrative changes of perirectal fat extending into perianal region and into medial RIGHT buttock compatible with perirectal abscess. Small gas and fluid collection in the RIGHT perirectal space measures 14 mm in greatest size, decreased from the 28 mm greatest diameter on the previous exam. Remaining large and small bowel  loops unremarkable. Normal appendix. Vascular/Lymphatic: Vascular structures unremarkable. No adenopathy. Reproductive: Unremarkable prostate gland Other: No free intraperitoneal air or fluid. Tiny umbilical hernia containing fat. Musculoskeletal: Mild degenerative disc disease changes at L5-S1. Degenerative changes of the hip joints bilaterally. No focal bone lesions. IMPRESSION: Again identified large mass at the anterior aspect of the upper LEFT kidney consistent with a renal neoplasm demonstrating evidence of LEFT renal vein invasion/extension. Two pancreatic masses are identified question pancreatic metastases, not significantly changed. Subtle focus of wall thickening and pericolic infiltrative changes at the mid sigmoid colon question diverticulitis though neoplasm not completely excluded, can be assessed by colonoscopy. Tiny RIGHT perirectal abscess 14 mm diameter significantly decreased from the previous exam. Electronically Signed   By: Lavonia Dana M.D.   On: 12/16/2017 17:52   Ct Abdomen Pelvis W Contrast  Result Date: 11/22/2017 CLINICAL DATA:  Acute onset of periumbilical abdominal pain tonight. Patient on Lovenox for DVT. Renal cell carcinoma. EXAM: CT ABDOMEN AND PELVIS WITH CONTRAST TECHNIQUE: Multidetector CT imaging of the abdomen and pelvis was performed using the standard protocol following bolus administration of intravenous contrast. CONTRAST:  119mL ISOVUE-300 IOPAMIDOL (ISOVUE-300) INJECTION 61% COMPARISON:  CT angiography 11/15/2017 FINDINGS: Lower chest: Left pleural thickening.  No frank effusion. Hepatobiliary: 7 mm low-density lesion in the central dome of  the liver, unchanged. No new hepatic lesion. Gallbladder physiologically distended, no calcified stone. No biliary dilatation. Pancreas: Low-density lesion in the mid pancreas measures 3.9 x 3.6 cm. Pancreatic head lesion measures 13 x 17 mm. No ductal dilatation or peripancreatic stranding. Spleen: Prominent size spanning 13.9 cm.  Adrenals/Urinary Tract: No dominant adrenal nodule. Again seen heterogeneous enhancing left renal lesion measuring approximately 5.3 x 4 cm extending into the left renal vein. No evidence of hemorrhage. No hydronephrosis. Simple cyst arises from the upper kidney measuring 3.7 cm. Post right nephrectomy. Urinary bladder is physiologically distended. Stomach/Bowel: Small hiatal hernia. Stomach physiologically distended. No bowel wall thickening, inflammatory change or obstruction. Normal appendix. Distal colonic diverticulosis without diverticulitis. Perianal air in the right tracking into the perineum is unchanged from the November 2018 CT. Vascular/Lymphatic: Filling defects within right common femoral and bilateral superficial femoral veins consistent with known DVT. No interval progression. No new vascular finding. Normal caliber abdominal aorta. Reproductive: Prostatic calcifications. Other: No ascites or free air.  No intra-abdominal abscess. Musculoskeletal: There are no acute or suspicious osseous abnormalities. IMPRESSION: 1. No acute abnormality. 2. Unchanged left renal mass with invasion of the left renal vein. Unchanged pancreatic body and pancreatic head lesions, potentially metastasis. 3. DVT in the right common femoral bilateral superficial femoral veins, without significant progression from recent CT. 4. Mild colonic diverticulosis without diverticulitis. Mild splenomegaly. Electronically Signed   By: Jeb Levering M.D.   On: 11/22/2017 06:09   Ct Renal Stone Study  Result Date: 11/28/2017 CLINICAL DATA:  Acute left flank pain. EXAM: CT ABDOMEN AND PELVIS WITHOUT CONTRAST TECHNIQUE: Multidetector CT imaging of the abdomen and pelvis was performed following the standard protocol without IV contrast. COMPARISON:  CT scan of November 22, 2017. FINDINGS: Lower chest: No acute abnormality. Hepatobiliary: Minimal cholelithiasis may be present. No focal abnormality is seen in the liver on these unenhanced  images. No biliary dilatation is noted. Pancreas: Grossly stable appearance and size of large mass seen in pancreatic body and smaller mass seen in pancreatic head, concerning for malignancy or metastatic disease. Spleen: Normal in size without focal abnormality. Adrenals/Urinary Tract: Adrenal glands are unremarkable. Status post right nephrectomy. Continued presence of large left renal mass consistent with renal cell carcinoma, with probable extension into the left renal vein. Urinary bladder is unremarkable. No hydronephrosis or renal obstruction is noted. No renal or ureteral calculi are no Stomach/Bowel: Stomach is within normal limits. Appendix appears normal. No evidence of bowel wall thickening, distention, or inflammatory changes. Sigmoid diverticulosis is noted without inflammation. Vascular/Lymphatic: No significant vascular findings are present. DVT described on prior exam cannot be assessed currently due to lack of intravenous contrast. No enlarged abdominal or pelvic lymph nodes. Reproductive: Prostate is unremarkable. Other: No abdominal wall hernia or abnormality. No abdominopelvic ascites. Musculoskeletal: No acute or significant osseous findings. IMPRESSION: Grossly stable appearance of large left renal mass is noted consistent with renal cell carcinoma, with probable extension into the left renal vein as described on prior exam. Status post right nephrectomy. No hydronephrosis or renal obstruction is noted. No renal or ureteral calculi are noted. Stable appearance of pancreatic masses as described above, consistent with malignancy or metastatic disease. Probable minimal cholelithiasis without inflammation. Sigmoid diverticulosis without inflammation. Electronically Signed   By: Marijo Conception, M.D.   On: 11/28/2017 10:40    Microbiology: No results found for this or any previous visit (from the past 240 hour(s)).   Labs: Basic Metabolic Panel: Recent Labs  Lab 12/16/17  1519  12/17/17 0551 12/19/17 0501  NA 133* 134* 138  K 4.1 4.3 3.8  CL 99* 99* 104  CO2 24 23 25   GLUCOSE 100* 111* 147*  BUN 9 10 18   CREATININE 1.24 1.32* 0.94  CALCIUM 8.0* 7.8* 7.9*   Liver Function Tests: Recent Labs  Lab 12/16/17 1519 12/17/17 0551  AST 16 13*  ALT 11* 9*  ALKPHOS 87 76  BILITOT 1.2 1.4*  PROT 5.3* 5.2*  ALBUMIN 2.2* 2.0*   Recent Labs  Lab 12/16/17 1546  LIPASE 21   No results for input(s): AMMONIA in the last 168 hours. CBC: Recent Labs  Lab 12/16/17 1519 12/17/17 0551 12/19/17 0501  WBC 5.1 5.6 1.9*  NEUTROABS 4.2  --   --   HGB 8.5* 8.4* 7.3*  HCT 27.3* 26.6* 23.8*  MCV 92.9 92.7 92.2  PLT 139* 131* 120*   Cardiac Enzymes: No results for input(s): CKTOTAL, CKMB, CKMBINDEX, TROPONINI in the last 168 hours. BNP: BNP (last 3 results) Recent Labs    09/29/17 1227  BNP 58.0    ProBNP (last 3 results) No results for input(s): PROBNP in the last 8760 hours.  CBG: No results for input(s): GLUCAP in the last 168 hours.     Signed:  Lelon Frohlich  [facility] Pager: [phone] 12/20/2017, 4:47 PM

## 2017-12-24 ENCOUNTER — Telehealth: Payer: Self-pay | Admitting: *Deleted

## 2017-12-24 NOTE — Telephone Encounter (Signed)
Wife joyce calling. Patient is c/o pain from abdomen down to his groin. Uses oxycodone, with little relief. Also states he is still bleeding from old incision near anus, from years ago. Patient has appt here with dr Alen Blew 01/02/18, but would like to come in sooner.

## 2017-12-26 ENCOUNTER — Telehealth: Payer: Self-pay | Admitting: *Deleted

## 2017-12-26 ENCOUNTER — Telehealth: Payer: Self-pay

## 2017-12-26 ENCOUNTER — Other Ambulatory Visit: Payer: Medicare Other

## 2017-12-26 ENCOUNTER — Ambulatory Visit: Payer: Medicare Other

## 2017-12-26 ENCOUNTER — Inpatient Hospital Stay: Payer: Medicare Other | Attending: Oncology | Admitting: Oncology

## 2017-12-26 VITALS — BP 117/62 | HR 88 | Temp 97.9°F | Resp 16

## 2017-12-26 DIAGNOSIS — C7889 Secondary malignant neoplasm of other digestive organs: Secondary | ICD-10-CM

## 2017-12-26 DIAGNOSIS — C649 Malignant neoplasm of unspecified kidney, except renal pelvis: Secondary | ICD-10-CM

## 2017-12-26 DIAGNOSIS — I82403 Acute embolism and thrombosis of unspecified deep veins of lower extremity, bilateral: Secondary | ICD-10-CM | POA: Diagnosis not present

## 2017-12-26 DIAGNOSIS — R109 Unspecified abdominal pain: Secondary | ICD-10-CM

## 2017-12-26 DIAGNOSIS — M79606 Pain in leg, unspecified: Secondary | ICD-10-CM | POA: Diagnosis not present

## 2017-12-26 DIAGNOSIS — Z7901 Long term (current) use of anticoagulants: Secondary | ICD-10-CM | POA: Diagnosis not present

## 2017-12-26 DIAGNOSIS — C642 Malignant neoplasm of left kidney, except renal pelvis: Secondary | ICD-10-CM

## 2017-12-26 MED ORDER — OXYCODONE HCL 5 MG PO TABS: 5.0000 mg | ORAL_TABLET | ORAL | 0 refills | Status: DC | PRN

## 2017-12-26 NOTE — Telephone Encounter (Signed)
Per 4/11 no los 

## 2017-12-26 NOTE — Progress Notes (Signed)
Hematology and Oncology Follow Up Visit  Jeffrey Trujillo [id number] 12/26/17     Principle Diagnosis: 67 year old man with:   1. Right kidney mass and S/P radical nephrectomy on June 27, 2009 for T1b tumor.  2. Left kidney diagnosed 07/2012.  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    Prior therapy:  He was started on Votrient  800 mg daily started in December 2013.Therapy discontinued in June 2017 because of progression of disease.   Current therapy:   Cabometyx 40 mg daily started in July 2017.    Interim History: Jeffrey Trujillo presents today for a follow-up visit.  Since the last visit, he has been hospitalized again between April 1 and December 20, 2017.  He has been complaining of increased abdominal pain and lower extremity pain as well as increased drainage from his anal fistula.  He was discharged on April 5 with long-acting oxycodone to take twice a day in addition to breakthrough oxycodone.  He is doing reasonably fair the last 48 hours where he started developing worsening abdominal discomfort as well as increase perianal drainage.  His last CT scan was on December 16, 2017 and did not show any acute changes.    He continues to report decline in his overall performance status and activity level.  He is chair bound at this time with limited activity.   He did not report any no neurological deficits.  He denied headaches blurred vision double vision.   He does not report any fevers, chills or sweats.  He does not report any chest pain, palpitation orthopnea.  He does not report any cough, wheezing or hemoptysis.  He does not report any frequency urgency or hesitancy.  Denies any skin rashes or lesions.  He denies any anxiety or depression. Remainder of his review of systems is negative.   Medications: Reviewed today and remained same. Current Outpatient Medications  Medication Sig Dispense Refill  . allopurinol (ZYLOPRIM) 100 MG tablet Take 0.5 tablets  (50 mg total) by mouth 2 (two) times daily. (Patient taking differently: Take 100 mg by mouth 2 (two) times daily. ) 20 tablet 0  . amoxicillin-clavulanate (AUGMENTIN) 875-125 MG tablet Take 1 tablet by mouth every 12 (twelve) hours. 26 tablet 0  . bisacodyl (DULCOLAX) 10 MG suppository Place 1 suppository (10 mg total) rectally daily as needed for moderate constipation. 12 suppository 0  . cabozantinib (CABOMETYX) 40 MG tablet Take 40 mg by mouth daily.    Marland Kitchen enoxaparin (LOVENOX) 120 MG/0.8ML injection Inject 0.8 mLs (120 mg total) into the skin daily. 60 Syringe 0  . oxyCODONE (OXY IR/ROXICODONE) 5 MG immediate release tablet Take 1 tablet (5 mg total) by mouth every 2 (two) hours as needed for breakthrough pain. 30 tablet 0  . oxyCODONE (OXYCONTIN) 20 mg 12 hr tablet Take 1 tablet (20 mg total) by mouth every 12 (twelve) hours. 60 tablet 0  . pantoprazole (PROTONIX) 40 MG tablet Take 1 tablet (40 mg total) by mouth daily. 30 tablet 0  . predniSONE (DELTASONE) 10 MG tablet Take 1 tablet (10 mg total) by mouth daily with breakfast. Take 6 tablets today and then decrease by 1 tablet daily until none are left. 21 tablet 0   No current facility-administered medications for this visit.      Allergies:  Allergies  Allergen Reactions  . Nsaids Other (See Comments)    Patient only has one kidney.   . Meperidine Hcl Hives and Rash  Past Medical History, Surgical history, Social history, and Family History were reviewed and updated.   Physical Exam: Blood pressure 117/62, pulse 88, temperature 97.9 F (36.6 C), temperature source Oral, resp. rate 16, SpO2 97 %.   ECOG: 1 General appearance: Chronically ill-appearing gentleman appeared in mild distress. Oropharynx: No thrush or ulcers. Eyes: Sclera anicteric. Lymph nodes: No lymphadenopathy palpated the cervical, axillary regions. Heart: Regular rate without murmurs or gallops. Lung: Clear in all lung fields without any wheezes or  dullness to percussion. Abdomen: soft, slightly distended and tender to touch.  No rebound or guarding. Musculoskeletal: No joint deformity or effusion. Neurological exam: No motor or sensory deficits.  Is moving all extremities. Skin: No ecchymosis or rash noted.   CBC    Component Value Date/Time   WBC 1.9 (L) 12/19/2017 0501   RBC 2.58 (L) 12/19/2017 0501   HGB 7.3 (L) 12/19/2017 0501   HGB 10.8 (L) 07/30/2017 1004   HCT 23.8 (L) 12/19/2017 0501   HCT 32.8 (L) 07/30/2017 1004   PLT 120 (L) 12/19/2017 0501   PLT 191 12/06/2017 0935   PLT 124 (L) 07/30/2017 1004   MCV 92.2 12/19/2017 0501   MCV 101.7 (H) 07/30/2017 1004   MCH 28.3 12/19/2017 0501   MCHC 30.7 12/19/2017 0501   RDW 16.9 (H) 12/19/2017 0501   RDW 20.4 (H) 07/30/2017 1004   LYMPHSABS 0.6 (L) 12/16/2017 1519   LYMPHSABS 0.3 (L) 07/30/2017 1004   MONOABS 0.2 12/16/2017 1519   MONOABS 0.4 07/30/2017 1004   EOSABS 0.0 12/16/2017 1519   EOSABS 0.0 07/30/2017 1004   BASOSABS 0.0 12/16/2017 1519   BASOSABS 0.0 07/30/2017 1004      Impression and Plan:   67 year old gentleman with the following issues:   1.  Metastatic renal cell carcinoma with a left kidney mass as well as pancreatic lesion.  He is currently on Cabometyx which she has been taking intermittently since July 2017.   The natural course of his disease was reviewed again today with the patient and his wife.  His CT scan on April 1 did not really show any major changes for his cancer but his condition have continued to decline.  It is unclear whether his deterioration is related to progressive cancer versus cancer treatment and has been rather debilitating for him.  At this time, I have recommended discontinuation of all anticancer treatment and consideration for supportive care only.  His prognosis is reasonably poor and likely dictated by his other comorbid conditions in addition to his cancer.  I have also recommended hospice involvement which we  will refer the patient to immediately.   2. Pancreatic lesion.  Related to metastatic renal cell carcinoma unchanged recently.  3.  Abdominal pain: Unclear etiology.  Could be related to pancreatitis versus gastroenteritis.  I recommend supportive management with using long-acting OxyContin and breakthrough oxycodone as needed.  If his pain gets severe in the next 24-38 hours he is to report back to emergency department for evaluation.  4.  Bilateral DVT: He is currently on Lovenox but having a lot of bleeding issues and pain with injections.  I recommended discontinuation of Lovenox at this time.  Consider IVC filter placement if Lovenox cannot be resumed.   5.  Prognosis: Poor at this time with limited life expectancy.  He has an incurable malignancy although has not rapidly progressed, he appears to be declining overall and would benefit from hospice enrollment.  6. Follow-up:  In 1 week to follow  his progress.  25 minutes was spent face-to-face with the patient today.  More than 50% of the time was dedicated to education, counseling and answering questions regarding diagnosis, prognosis and future plan of care.  [name] MD 12/26/17

## 2017-12-26 NOTE — Progress Notes (Signed)
Patient referred to [facility] Chart faxed to [phone]  Phone # [phone]

## 2017-12-26 NOTE — Telephone Encounter (Signed)
Patient and wife given information sheet on what hospice offers and provides for the patient and family

## 2017-12-27 ENCOUNTER — Other Ambulatory Visit: Payer: Self-pay | Admitting: Oncology

## 2017-12-27 MED ORDER — MORPHINE SULFATE 15 MG PO TABS
15.0000 mg | ORAL_TABLET | ORAL | 0 refills | Status: AC | PRN
Start: 2017-12-27 — End: ?

## 2017-12-27 MED ORDER — MORPHINE SULFATE ER 15 MG PO TBCR: 15.0000 mg | EXTENDED_RELEASE_TABLET | Freq: Two times a day (BID) | ORAL | 0 refills | Status: AC

## 2017-12-27 NOTE — Progress Notes (Signed)
I was called by hospice regarding pain medications for this patient.  [name] intended to switch him to morphine from oxycodone.  Apparently the prescriptions were not in.  I put in a prescription for MS Contin 15 mg to take twice daily and MSIR 15 mg tablets to take 15-30 mg every 2 hours as needed.

## 2017-12-30 ENCOUNTER — Telehealth: Payer: Self-pay | Admitting: *Deleted

## 2017-12-30 NOTE — Telephone Encounter (Signed)
"  Clarise Cruz RN, hospice, calling to ask if [name] will increase medications for this patient."

## 2018-01-01 ENCOUNTER — Other Ambulatory Visit: Payer: Self-pay | Admitting: *Deleted

## 2018-01-01 DIAGNOSIS — R52 Pain, unspecified: Secondary | ICD-10-CM

## 2018-01-01 DIAGNOSIS — K8689 Other specified diseases of pancreas: Secondary | ICD-10-CM

## 2018-01-01 DIAGNOSIS — C649 Malignant neoplasm of unspecified kidney, except renal pelvis: Secondary | ICD-10-CM

## 2018-01-01 MED ORDER — OXYCODONE HCL 5 MG PO TABS: 5.0000 mg | ORAL_TABLET | ORAL | 0 refills | Status: AC | PRN

## 2018-01-02 ENCOUNTER — Inpatient Hospital Stay: Payer: Medicare Other | Admitting: Oncology

## 2018-01-02 ENCOUNTER — Inpatient Hospital Stay: Payer: Medicare Other

## 2018-01-08 ENCOUNTER — Telehealth: Payer: Self-pay | Admitting: *Deleted

## 2018-01-08 ENCOUNTER — Encounter: Payer: Self-pay | Admitting: *Deleted

## 2018-01-08 NOTE — Telephone Encounter (Signed)
Notification of patient's death. January 11, 2018. All appts deleted

## 2018-01-12 ENCOUNTER — Other Ambulatory Visit: Payer: Self-pay | Admitting: Oncology

## 2018-01-15 DEATH — deceased

## 2019-06-06 IMAGING — DX DG CHEST 2V
2 series · 2 of 2 positions shown · non-contrast
Comparison: 11/15/2016 and prior radiographs

CLINICAL DATA: LOWER extremity edema.

EXAM:
CHEST  2 VIEW

[chest lat]
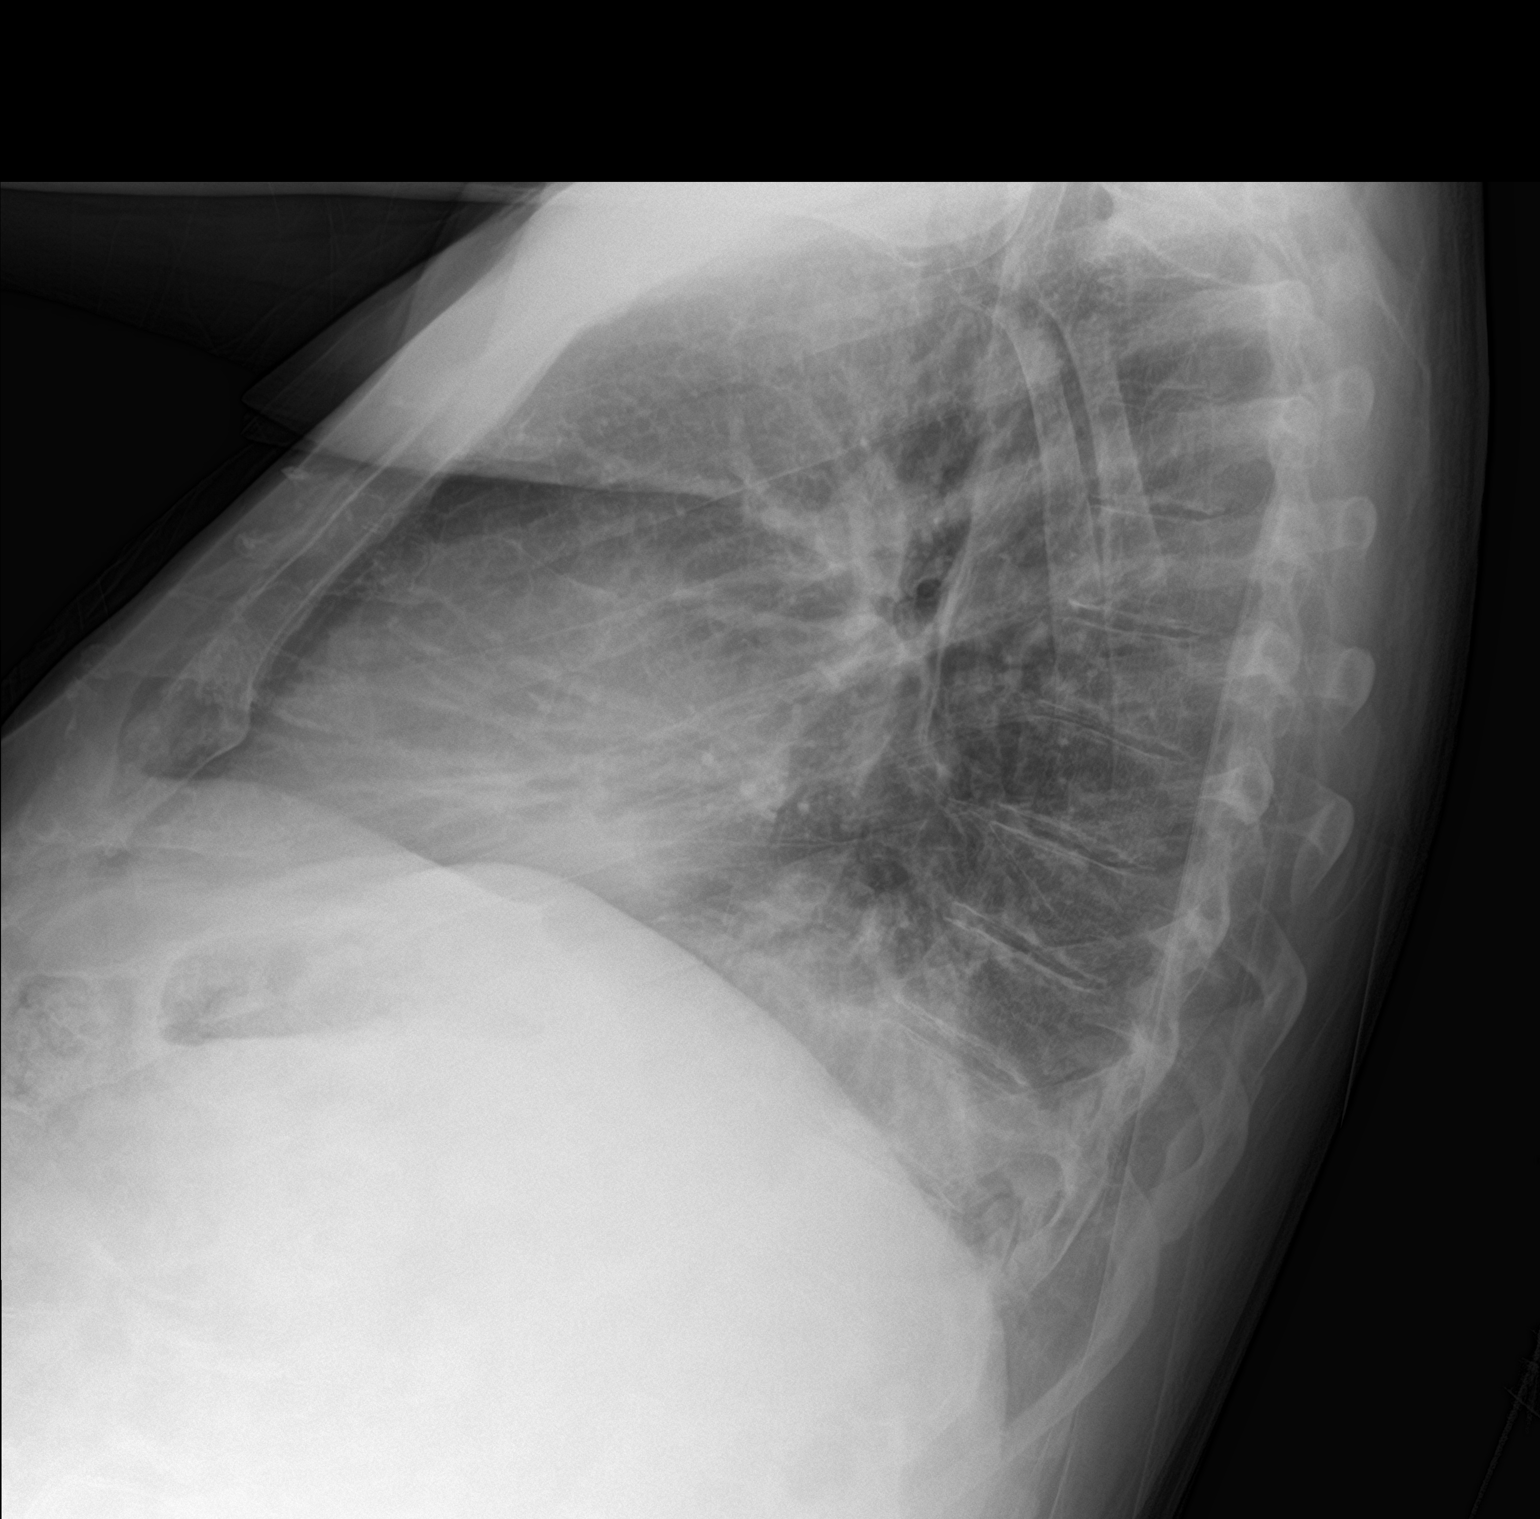

[chest ap]
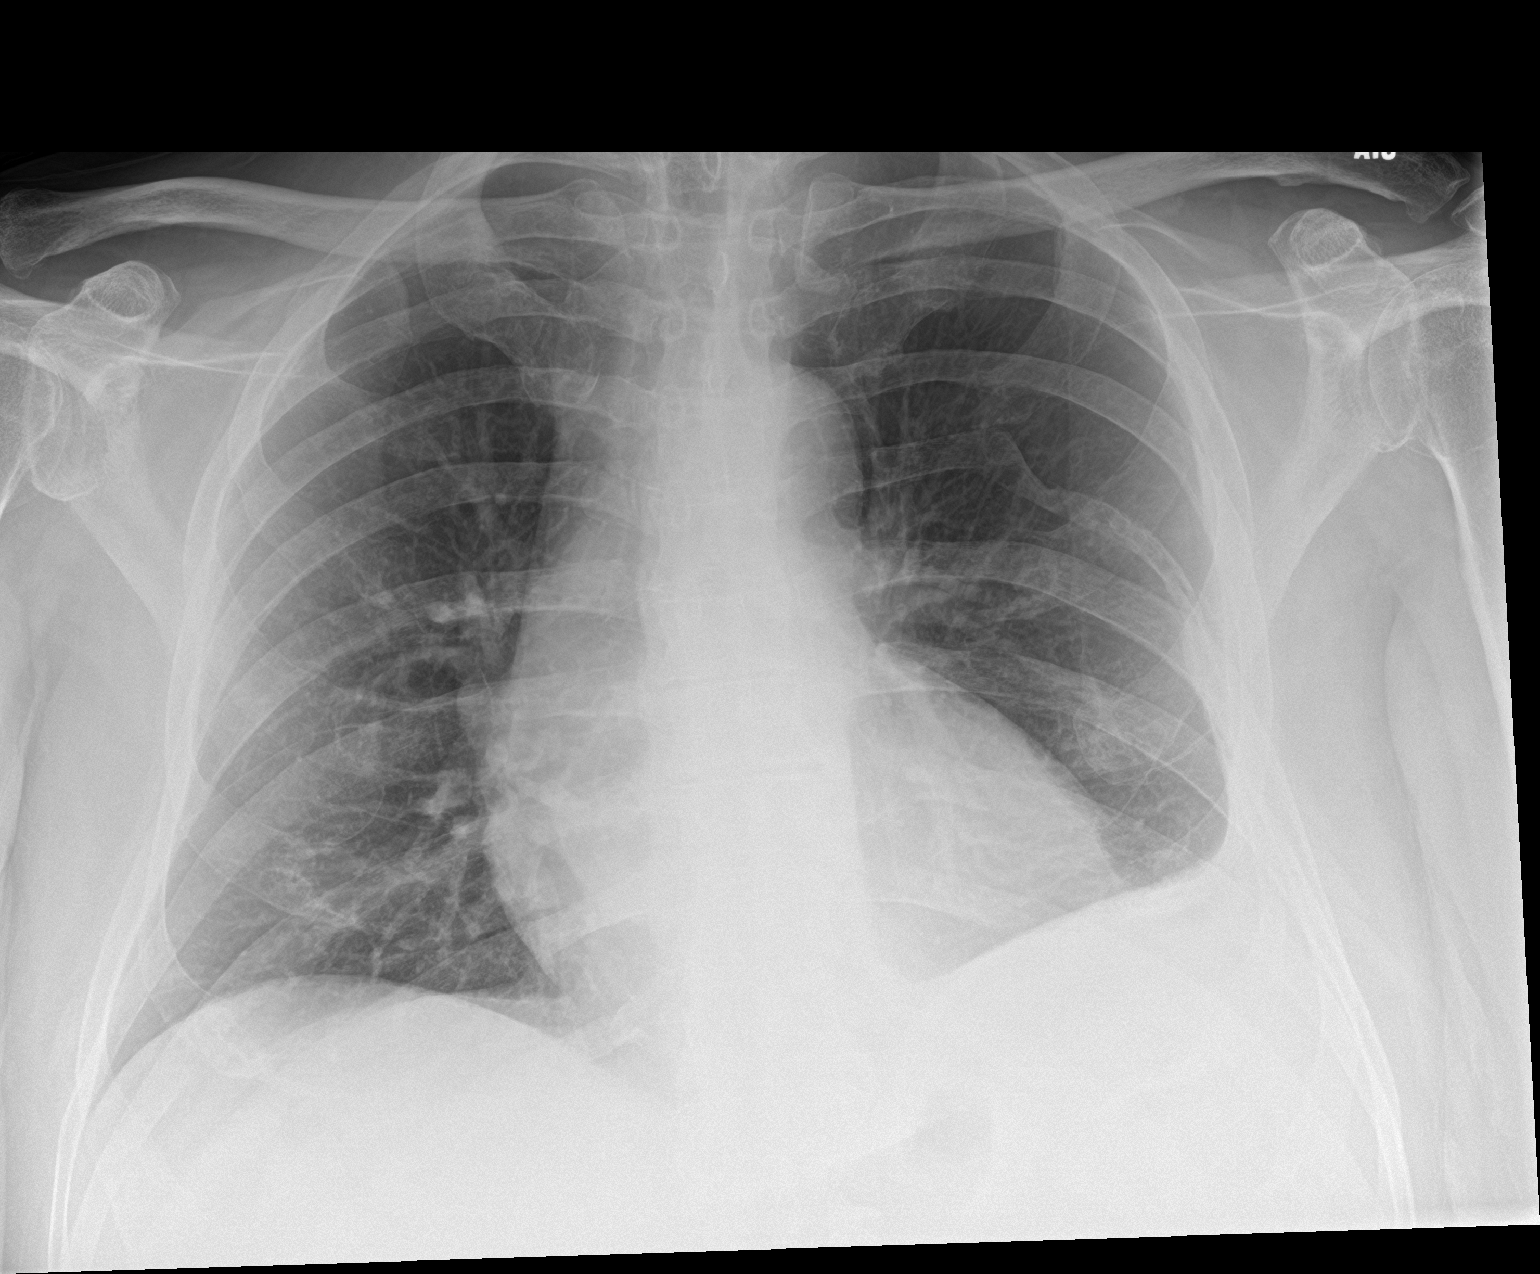

[2 of 2 positions shown; findings below may reference images not displayed]

FINDINGS: Cardiomediastinal silhouette is unremarkable.

LEFT basilar pleuroparenchymal scarring again noted.

There is no evidence of focal airspace disease, pulmonary edema,
suspicious pulmonary nodule/mass, pleural effusion, or pneumothorax.

No acute bony abnormalities are identified.
IMPRESSION: No evidence of acute cardiopulmonary disease.

## 2019-06-13 IMAGING — CT CT ABD-PELV W/ CM
2 of 4 series · 16 of 46 positions shown, 18 images · IV contrast (iopamidol)
Comparison: CT angiography 11/15/2017

CLINICAL DATA: Acute onset of periumbilical abdominal pain tonight.
Patient on Lovenox for DVT. Renal cell carcinoma.

EXAM:
CT ABDOMEN AND PELVIS WITH CONTRAST
TECHNIQUE: Multidetector CT imaging of the abdomen and pelvis was performed
using the standard protocol following bolus administration of
intravenous contrast.
CONTRAST:  100mL [id number] IOPAMIDOL ([id number]-XWW) INJECTION 61%

[Series 2: axial st · axial · 0.85mm/px · z∈[-573,-153]mm · 13 of 92 slices shown, 15 images]
[im 4/92  soft-tissue]
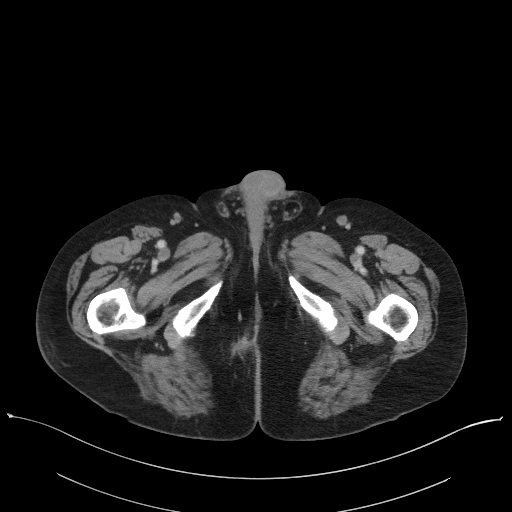
[im 4/92  bone]
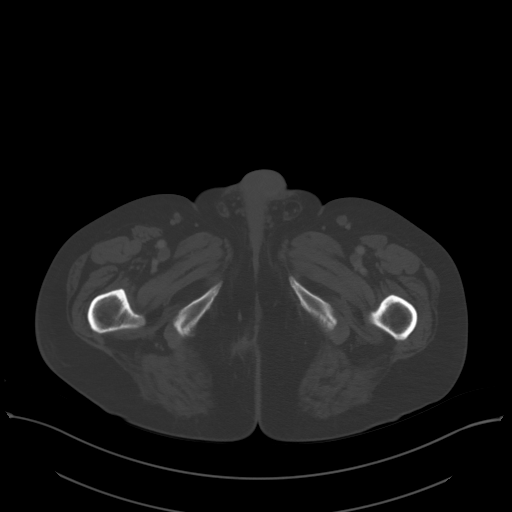
[im 12/92  soft-tissue]
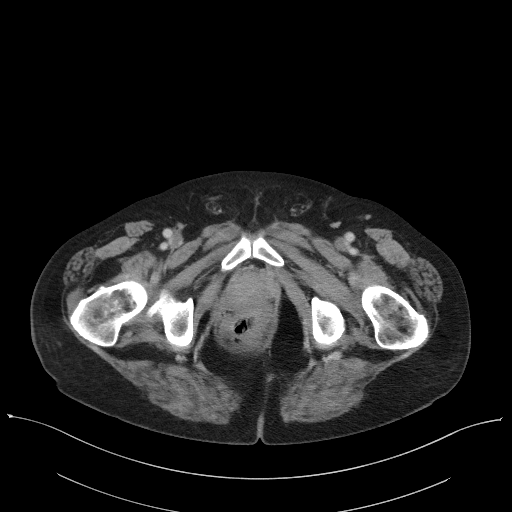
[im 20/92  soft-tissue]
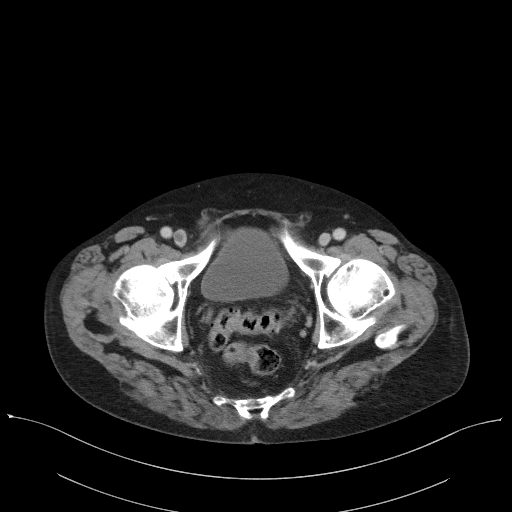
[im 24/92  soft-tissue]
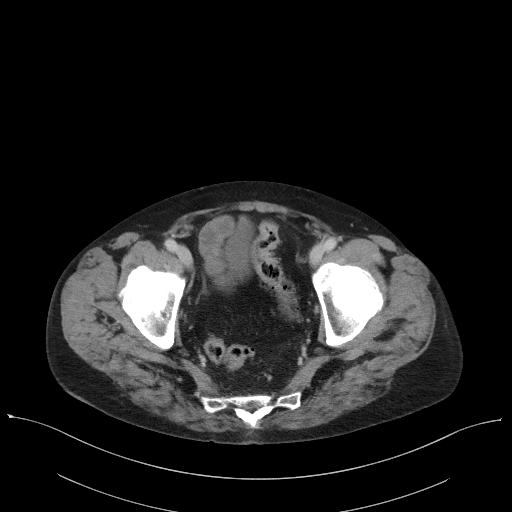
[im 32/92  soft-tissue]
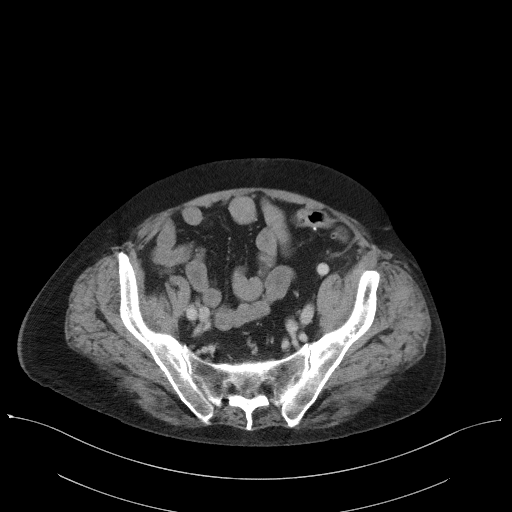
[im 40/92  soft-tissue]
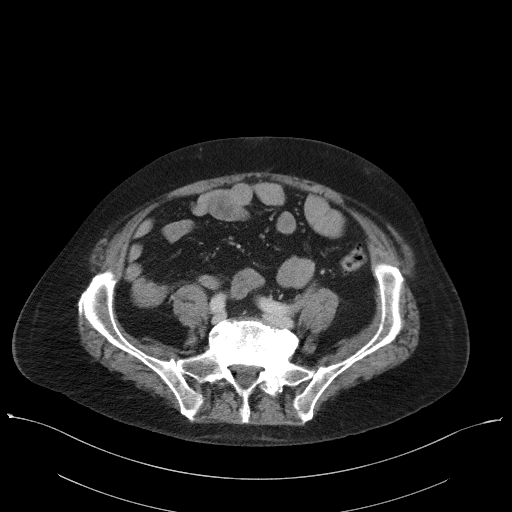
[im 48/92  soft-tissue]
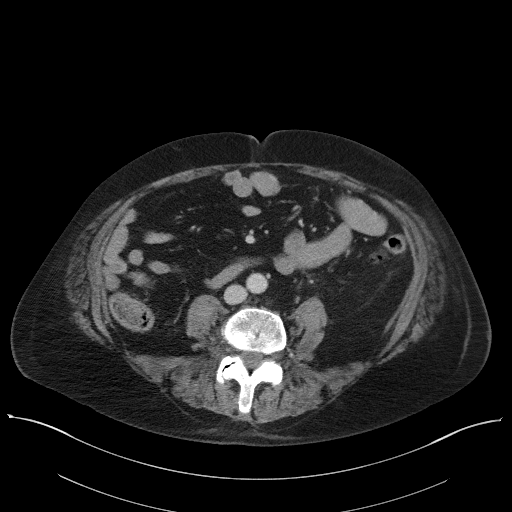
[im 52/92  soft-tissue]
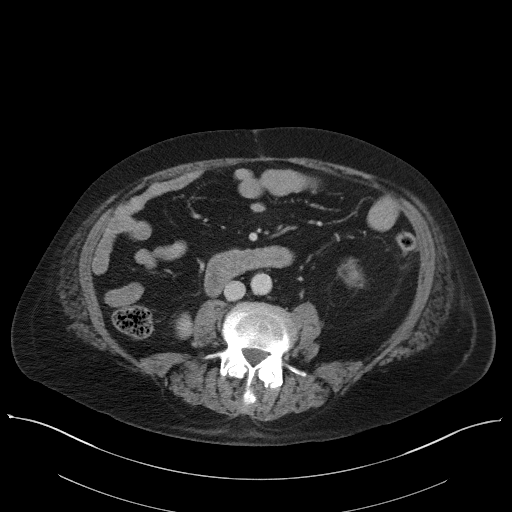
[im 60/92  soft-tissue]
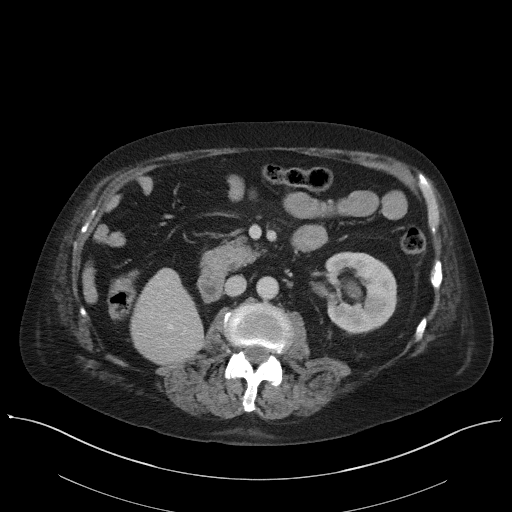
[im 60/92  bone]
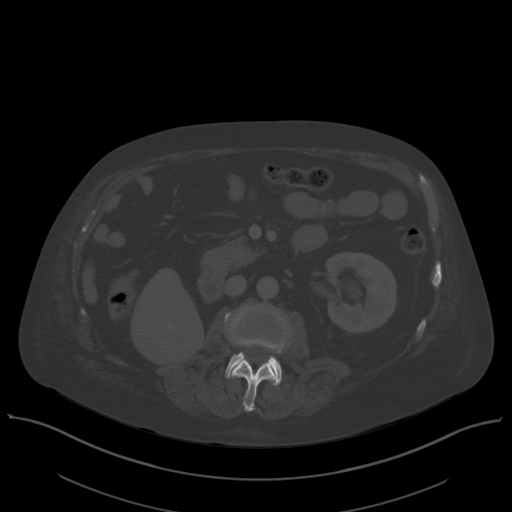
[im 68/92  soft-tissue]
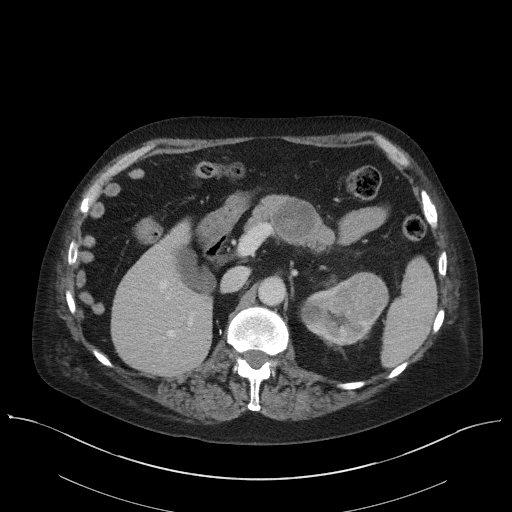
[im 72/92  soft-tissue]
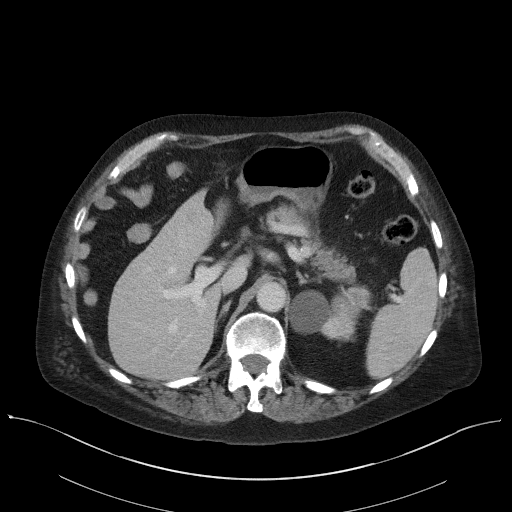
[im 80/92  soft-tissue]
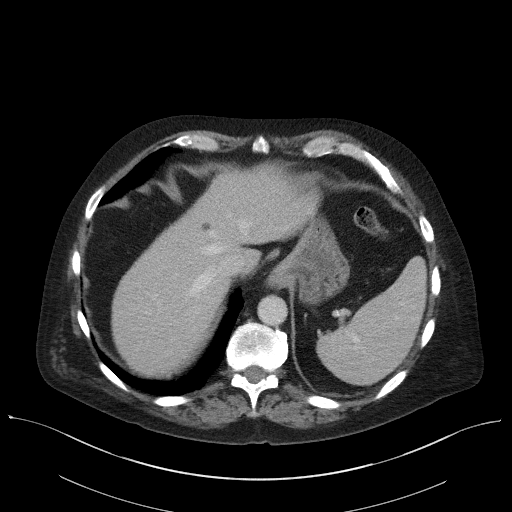
[im 88/92  soft-tissue]
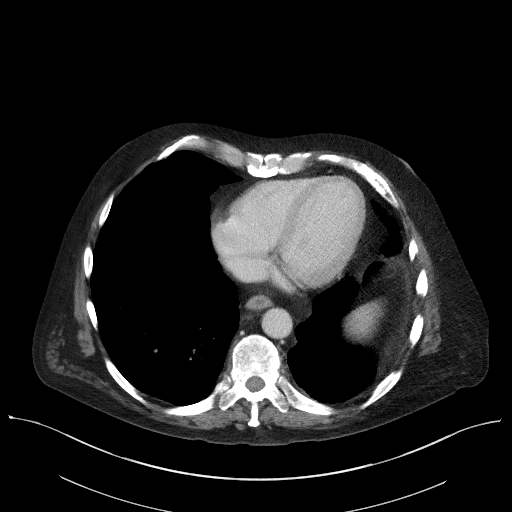

[Series 5: coronal st · coronal · 0.80mm/px · 3 of 93 slices shown]
[im 31/93  soft-tissue]
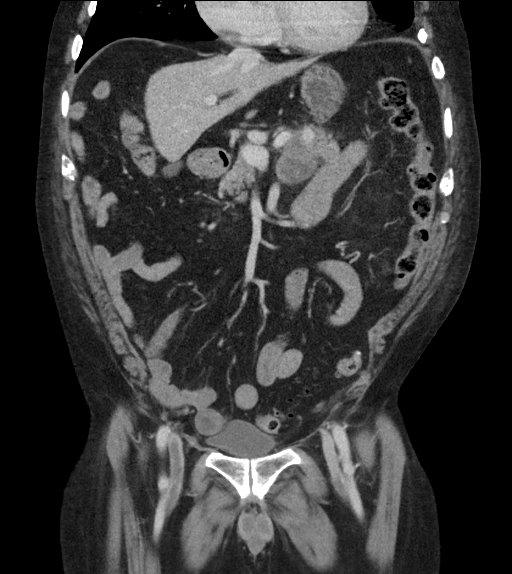
[im 41/93  soft-tissue]
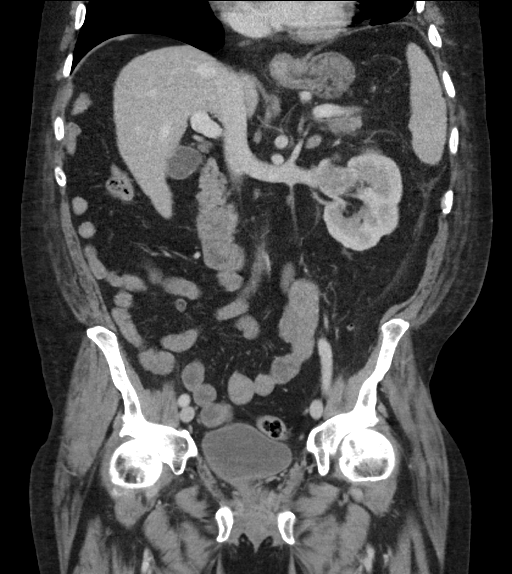
[im 52/93  soft-tissue]
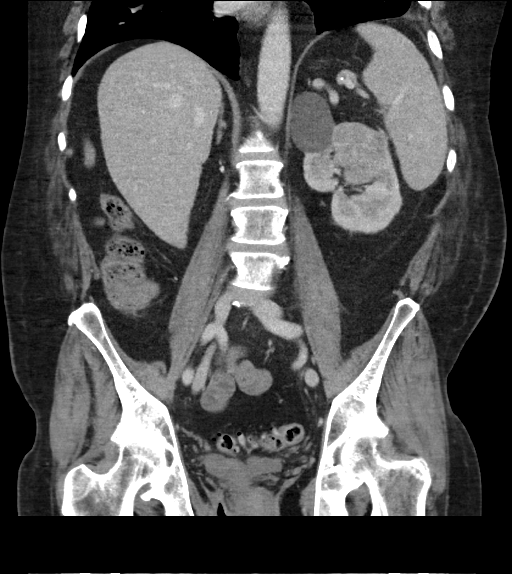

[16 of 46 positions shown; findings below may reference images not displayed]

FINDINGS: Lower chest: Left pleural thickening.  No frank effusion.

Hepatobiliary: 7 mm low-density lesion in the central dome of the
liver, unchanged. No new hepatic lesion. Gallbladder physiologically
distended, no calcified stone. No biliary dilatation.

Pancreas: Low-density lesion in the mid pancreas measures 3.9 x
cm. Pancreatic head lesion measures 13 x 17 mm. No ductal dilatation
or peripancreatic stranding.

Spleen: Prominent size spanning 13.9 cm.

Adrenals/Urinary Tract: No dominant adrenal nodule. Again seen
heterogeneous enhancing left renal lesion measuring approximately
5.3 x 4 cm extending into the left renal vein. No evidence of
hemorrhage. No hydronephrosis. Simple cyst arises from the upper
kidney measuring 3.7 cm. Post right nephrectomy. Urinary bladder is
physiologically distended.

Stomach/Bowel: Small hiatal hernia. Stomach physiologically
distended. No bowel wall thickening, inflammatory change or
obstruction. Normal appendix. Distal colonic diverticulosis without
diverticulitis. Perianal air in the right tracking into the perineum
is unchanged from the July 2017 CT.

Vascular/Lymphatic: Filling defects within right common femoral and
bilateral superficial femoral veins consistent with known DVT. No
interval progression. No new vascular finding. Normal caliber
abdominal aorta.

Reproductive: Prostatic calcifications.

Other: No ascites or free air.  No intra-abdominal abscess.

Musculoskeletal: There are no acute or suspicious osseous
abnormalities.
IMPRESSION: 1. No acute abnormality.
2. Unchanged left renal mass with invasion of the left renal vein.
Unchanged pancreatic body and pancreatic head lesions, potentially
metastasis.
3. DVT in the right common femoral bilateral superficial femoral
veins, without significant progression from recent CT.
4. Mild colonic diverticulosis without diverticulitis. Mild
splenomegaly.

## 2019-12-21 IMAGING — US US EXTREM LOW VENOUS BILAT
1 series · 13 of 24 positions shown · non-contrast
Comparison: None.

CLINICAL DATA: Bilateral lower extremity edema for the past week.
Former smoker. History of metastatic renal cell carcinoma. Evaluate
for DVT.



[Series 1: us extrem low venous bilat · 0.11mm/px · 13 of 67 slices shown]
[im 1/67]
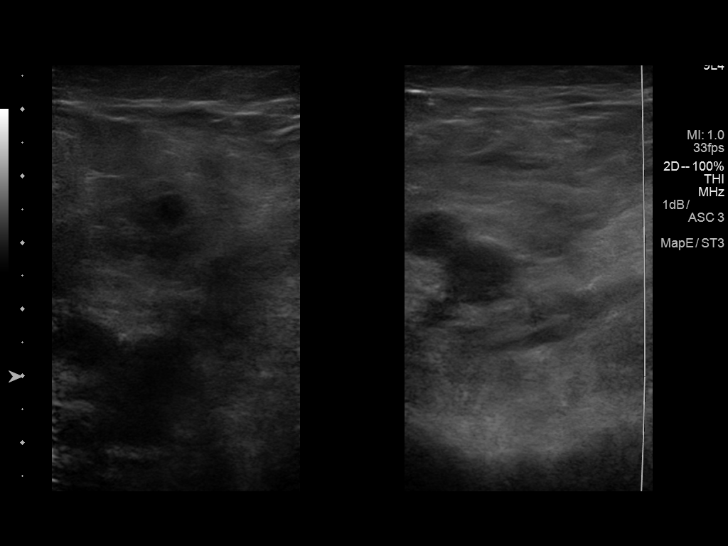
[im 6/67]
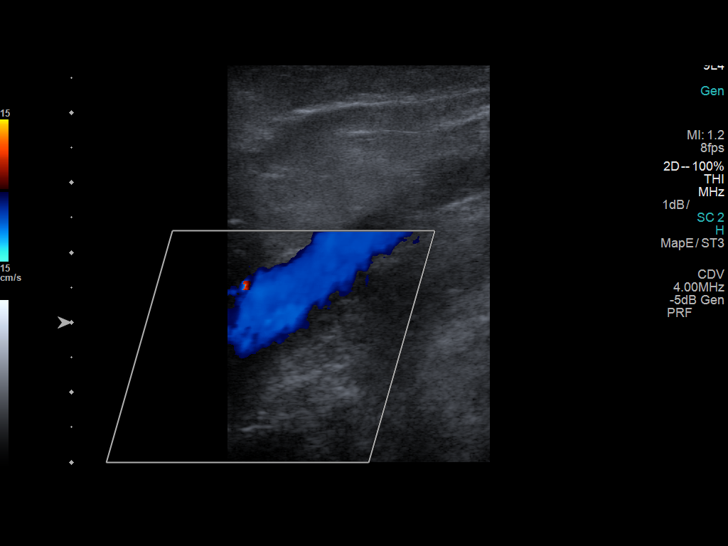
[im 12/67]
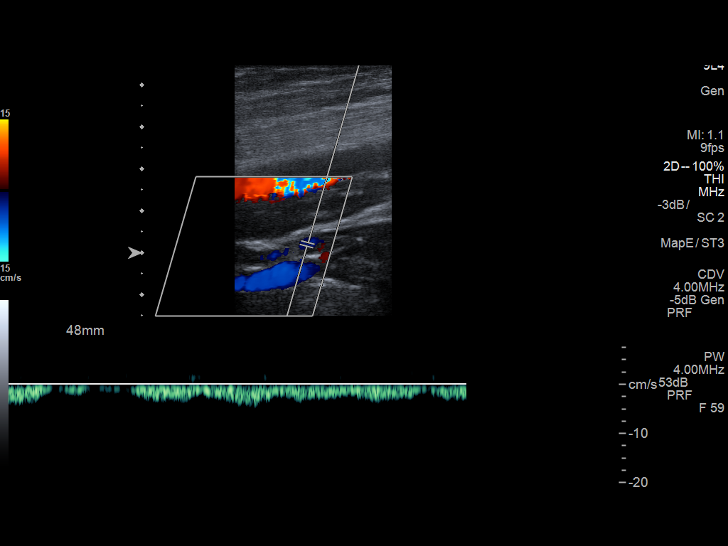
[im 18/67]
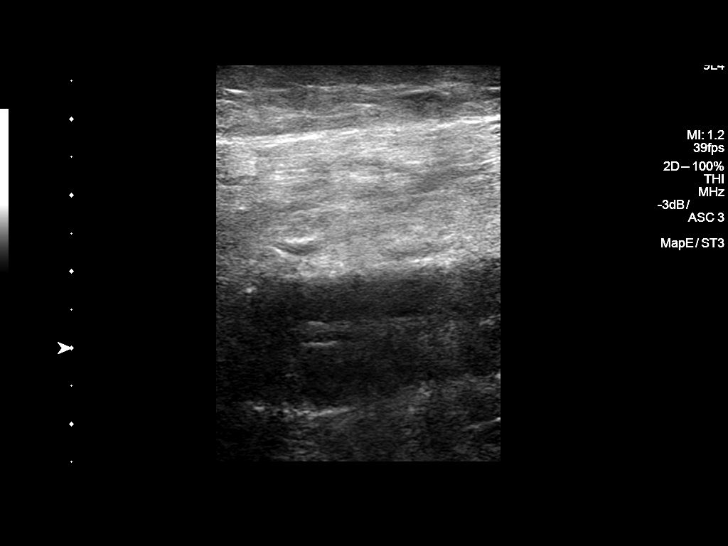
[im 23/67]
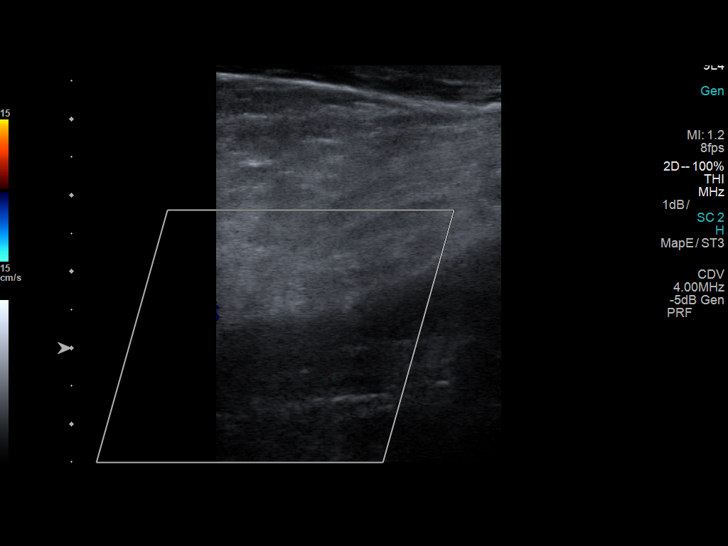
[im 29/67]
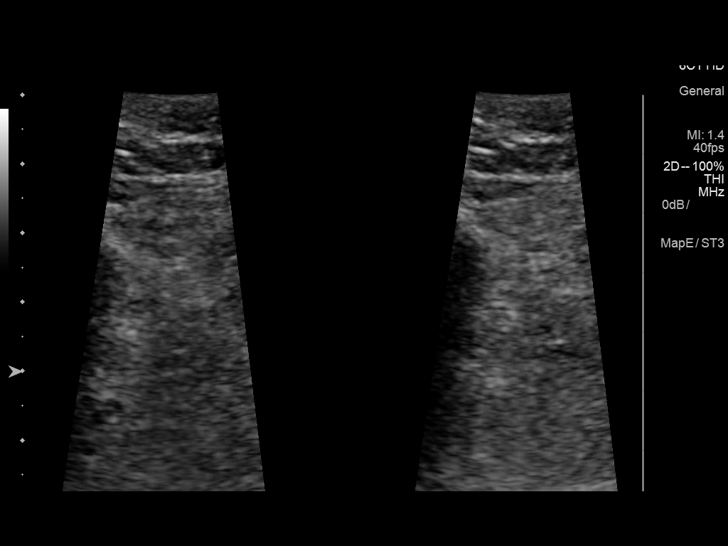
[im 35/67]
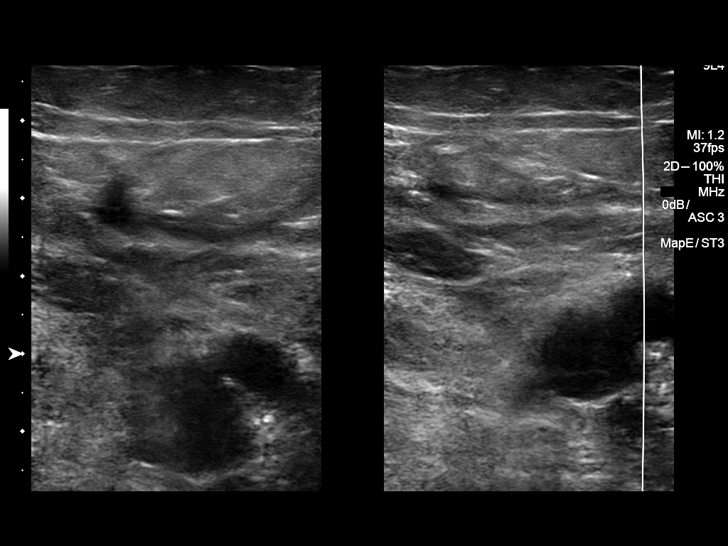
[im 38/67]
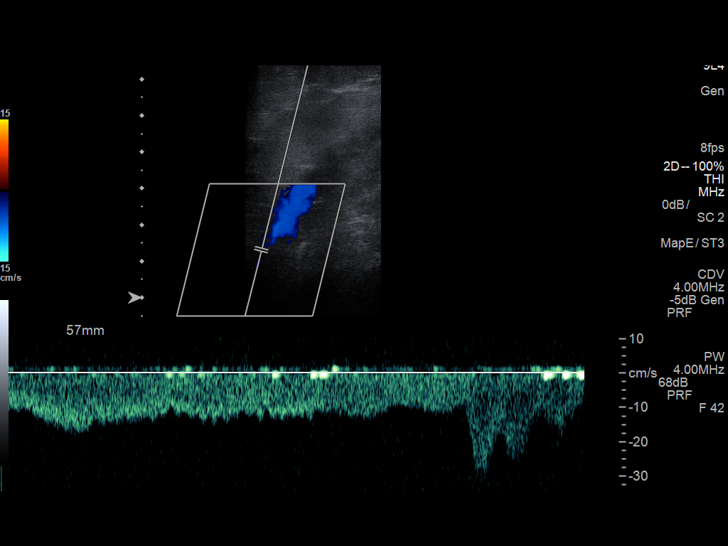
[im 44/67]
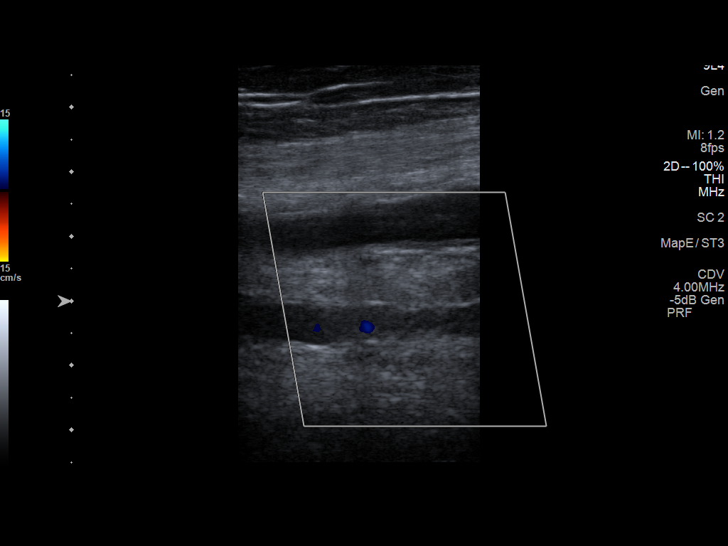
[im 49/67]
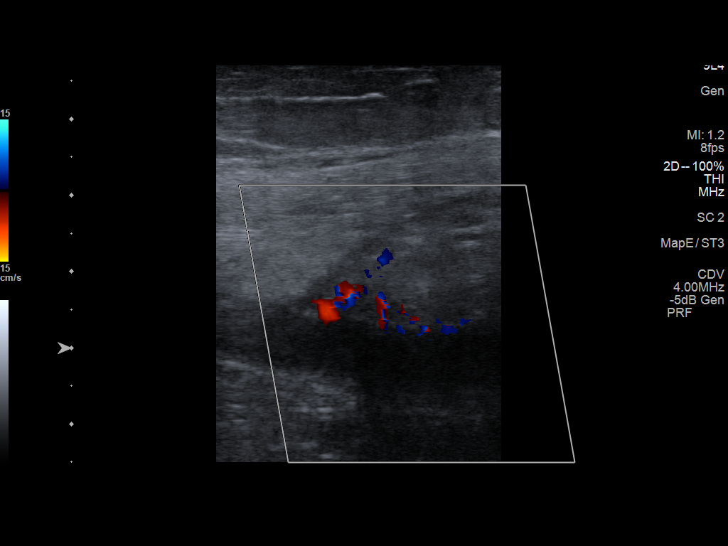
[im 55/67]
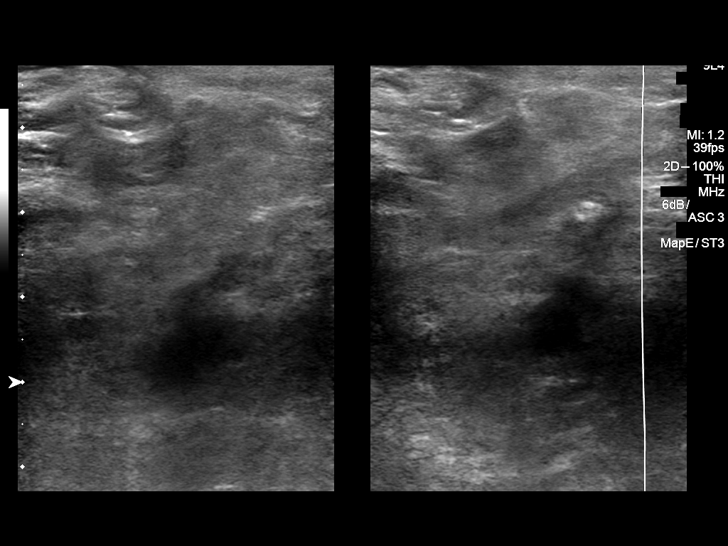
[im 61/67]
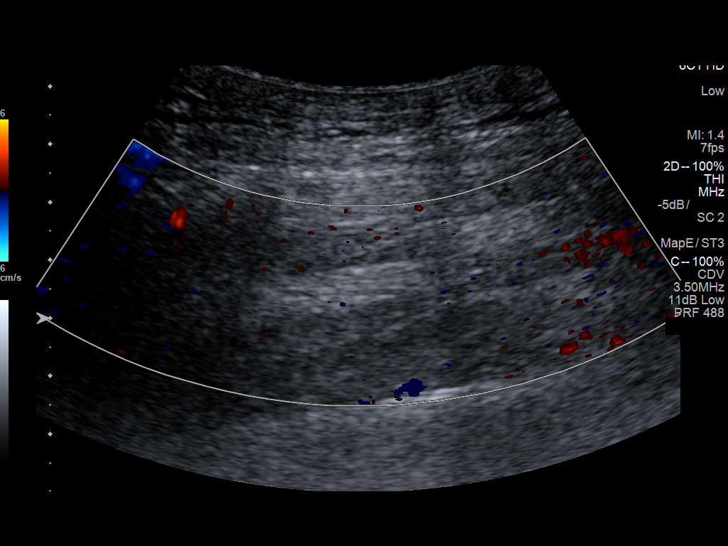
[im 67/67]
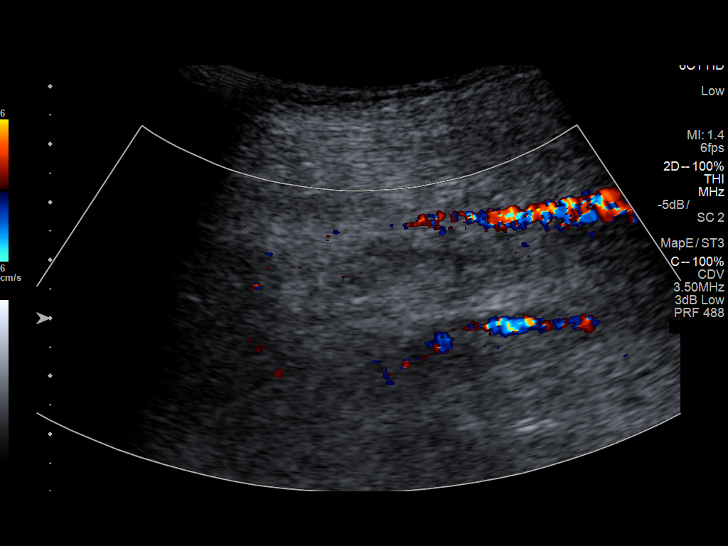

[13 of 24 positions shown; findings below may reference images not displayed]

FINDINGS: RIGHT LOWER EXTREMITY

There is mixed echogenic occlusive DVT within the right common
femoral vein (image 4), extending to the proximal (image 13), mid
(image 17) and distal (image 21) aspects of the right femoral vein
and the right popliteal vein (image 25).

The right deep femoral (image 12) and tibial veins vein appears
patent where imaged.

The right saphenofemoral junction and greater saphenous veins appear
patent where imaged.

Other Findings:  None.

LEFT LOWER EXTREMITY

There is hypoechoic nonocclusive thrombus within the left common
femoral vein.

There is hypoechoic occlusive DVT seen throughout the interrogated
course of the left deep femoral vein (image 44), proximal (image
46), mid (image 51) and distal (image 55) aspects of the femoral
vein, extending to involve the left popliteal vein (image 58) and
imaged portions of the tibial veins.

The left saphenofemoral junction and greater saphenous vein appear
patent where imaged.

Other Findings:  None.
IMPRESSION: Examination is positive for extensive bilateral largely occlusive
DVT as detailed above.

## 2021-02-07 ENCOUNTER — Other Ambulatory Visit (HOSPITAL_BASED_OUTPATIENT_CLINIC_OR_DEPARTMENT_OTHER): Payer: Self-pay
